# Patient Record
Sex: Male | Born: 1935
Health system: Southern US, Community
[De-identification: ages and names within clinical notes are randomized; demographics above are authoritative.]

## PROBLEM LIST (undated history)

## (undated) DIAGNOSIS — I6529 Occlusion and stenosis of unspecified carotid artery: Secondary | ICD-10-CM

## (undated) DIAGNOSIS — K644 Residual hemorrhoidal skin tags: Secondary | ICD-10-CM

## (undated) DIAGNOSIS — I10 Essential (primary) hypertension: Secondary | ICD-10-CM

## (undated) DIAGNOSIS — K2950 Unspecified chronic gastritis without bleeding: Secondary | ICD-10-CM

## (undated) DIAGNOSIS — C029 Malignant neoplasm of tongue, unspecified: Secondary | ICD-10-CM

## (undated) DIAGNOSIS — K5730 Diverticulosis of large intestine without perforation or abscess without bleeding: Secondary | ICD-10-CM

## (undated) DIAGNOSIS — E871 Hypo-osmolality and hyponatremia: Secondary | ICD-10-CM

## (undated) DIAGNOSIS — J449 Chronic obstructive pulmonary disease, unspecified: Secondary | ICD-10-CM

## (undated) DIAGNOSIS — H3530 Unspecified macular degeneration: Secondary | ICD-10-CM

## (undated) DIAGNOSIS — M1990 Unspecified osteoarthritis, unspecified site: Secondary | ICD-10-CM

## (undated) DIAGNOSIS — K648 Other hemorrhoids: Secondary | ICD-10-CM

## (undated) DIAGNOSIS — I4892 Unspecified atrial flutter: Principal | ICD-10-CM

## (undated) DIAGNOSIS — K219 Gastro-esophageal reflux disease without esophagitis: Secondary | ICD-10-CM

## (undated) DIAGNOSIS — K635 Polyp of colon: Secondary | ICD-10-CM

## (undated) DIAGNOSIS — K589 Irritable bowel syndrome without diarrhea: Secondary | ICD-10-CM

## (undated) DIAGNOSIS — J189 Pneumonia, unspecified organism: Secondary | ICD-10-CM

## (undated) DIAGNOSIS — I639 Cerebral infarction, unspecified: Secondary | ICD-10-CM

## (undated) HISTORY — DX: Residual hemorrhoidal skin tags: K64.4

## (undated) HISTORY — DX: Cerebral infarction, unspecified: I63.9

## (undated) HISTORY — PX: TRIGGER FINGER RELEASE: SHX641

## (undated) HISTORY — DX: Polyp of colon: K63.5

## (undated) HISTORY — PX: CATARACT EXTRACTION W/ INTRAOCULAR LENS IMPLANT: SHX1309

## (undated) HISTORY — DX: Gastro-esophageal reflux disease without esophagitis: K21.9

## (undated) HISTORY — DX: Other hemorrhoids: K64.8

## (undated) HISTORY — DX: Diverticulosis of large intestine without perforation or abscess without bleeding: K57.30

## (undated) HISTORY — DX: Chronic obstructive pulmonary disease, unspecified: J44.9

## (undated) HISTORY — DX: Irritable bowel syndrome, unspecified: K58.9

## (undated) HISTORY — DX: Unspecified chronic gastritis without bleeding: K29.50

## (undated) HISTORY — DX: Unspecified macular degeneration: H35.30

## (undated) HISTORY — DX: Essential (primary) hypertension: I10

---

## 1966-01-08 HISTORY — PX: HEMORRHOID SURGERY: SHX153

## 1997-02-08 DIAGNOSIS — C029 Malignant neoplasm of tongue, unspecified: Secondary | ICD-10-CM

## 1997-02-08 HISTORY — PX: OTHER SURGICAL HISTORY: SHX169

## 1997-02-08 HISTORY — DX: Malignant neoplasm of tongue, unspecified: C02.9

## 1997-05-05 ENCOUNTER — Other Ambulatory Visit: Admission: RE | Admit: 1997-05-05 | Discharge: 1997-05-05 | Payer: Self-pay | Admitting: Otolaryngology

## 1998-12-26 ENCOUNTER — Encounter: Payer: Self-pay | Admitting: Otolaryngology

## 1998-12-26 ENCOUNTER — Encounter: Admission: RE | Admit: 1998-12-26 | Discharge: 1998-12-26 | Payer: Self-pay | Admitting: Otolaryngology

## 1998-12-30 ENCOUNTER — Ambulatory Visit (HOSPITAL_COMMUNITY): Admission: RE | Admit: 1998-12-30 | Discharge: 1998-12-30 | Payer: Self-pay | Admitting: Otolaryngology

## 1998-12-30 ENCOUNTER — Encounter: Payer: Self-pay | Admitting: Otolaryngology

## 1999-09-04 ENCOUNTER — Encounter: Payer: Self-pay | Admitting: Otolaryngology

## 1999-09-04 ENCOUNTER — Encounter: Admission: RE | Admit: 1999-09-04 | Discharge: 1999-09-04 | Payer: Self-pay | Admitting: Otolaryngology

## 2000-09-06 ENCOUNTER — Encounter: Payer: Self-pay | Admitting: Otolaryngology

## 2000-09-06 ENCOUNTER — Encounter: Admission: RE | Admit: 2000-09-06 | Discharge: 2000-09-06 | Payer: Self-pay | Admitting: Otolaryngology

## 2001-07-28 ENCOUNTER — Encounter: Payer: Self-pay | Admitting: Otolaryngology

## 2001-07-28 ENCOUNTER — Encounter: Admission: RE | Admit: 2001-07-28 | Discharge: 2001-07-28 | Payer: Self-pay | Admitting: Otolaryngology

## 2002-08-04 ENCOUNTER — Encounter: Admission: RE | Admit: 2002-08-04 | Discharge: 2002-08-04 | Payer: Self-pay | Admitting: Otolaryngology

## 2002-08-04 ENCOUNTER — Encounter: Payer: Self-pay | Admitting: Otolaryngology

## 2002-12-09 ENCOUNTER — Emergency Department (HOSPITAL_COMMUNITY): Admission: EM | Admit: 2002-12-09 | Discharge: 2002-12-09 | Payer: Self-pay | Admitting: Emergency Medicine

## 2005-05-21 ENCOUNTER — Ambulatory Visit: Payer: Self-pay | Admitting: Gastroenterology

## 2005-06-19 ENCOUNTER — Ambulatory Visit: Payer: Self-pay | Admitting: Gastroenterology

## 2005-06-26 ENCOUNTER — Ambulatory Visit: Payer: Self-pay | Admitting: Gastroenterology

## 2005-06-26 ENCOUNTER — Encounter (INDEPENDENT_AMBULATORY_CARE_PROVIDER_SITE_OTHER): Payer: Self-pay | Admitting: Specialist

## 2005-06-26 DIAGNOSIS — K635 Polyp of colon: Secondary | ICD-10-CM

## 2005-06-26 HISTORY — DX: Polyp of colon: K63.5

## 2007-10-15 ENCOUNTER — Encounter: Payer: Self-pay | Admitting: Cardiology

## 2008-11-12 ENCOUNTER — Encounter: Payer: Self-pay | Admitting: Cardiology

## 2008-11-15 ENCOUNTER — Ambulatory Visit: Payer: Self-pay | Admitting: Cardiology

## 2008-11-15 DIAGNOSIS — J4489 Other specified chronic obstructive pulmonary disease: Secondary | ICD-10-CM | POA: Insufficient documentation

## 2008-11-15 DIAGNOSIS — K589 Irritable bowel syndrome without diarrhea: Secondary | ICD-10-CM | POA: Insufficient documentation

## 2008-11-15 DIAGNOSIS — R0789 Other chest pain: Secondary | ICD-10-CM | POA: Insufficient documentation

## 2008-11-15 DIAGNOSIS — R9431 Abnormal electrocardiogram [ECG] [EKG]: Secondary | ICD-10-CM

## 2008-11-15 DIAGNOSIS — K219 Gastro-esophageal reflux disease without esophagitis: Secondary | ICD-10-CM | POA: Insufficient documentation

## 2008-11-15 DIAGNOSIS — I10 Essential (primary) hypertension: Secondary | ICD-10-CM | POA: Insufficient documentation

## 2008-11-15 DIAGNOSIS — J449 Chronic obstructive pulmonary disease, unspecified: Secondary | ICD-10-CM

## 2008-11-17 ENCOUNTER — Encounter: Payer: Self-pay | Admitting: Cardiology

## 2010-06-15 ENCOUNTER — Encounter: Payer: Self-pay | Admitting: Internal Medicine

## 2010-07-27 ENCOUNTER — Encounter: Payer: Self-pay | Admitting: Internal Medicine

## 2010-07-27 ENCOUNTER — Other Ambulatory Visit (INDEPENDENT_AMBULATORY_CARE_PROVIDER_SITE_OTHER): Payer: Medicare Other

## 2010-07-27 ENCOUNTER — Ambulatory Visit (INDEPENDENT_AMBULATORY_CARE_PROVIDER_SITE_OTHER): Payer: Medicare Other | Admitting: Internal Medicine

## 2010-07-27 VITALS — BP 128/68 | HR 78 | Ht 74.0 in | Wt 165.0 lb

## 2010-07-27 DIAGNOSIS — R195 Other fecal abnormalities: Secondary | ICD-10-CM

## 2010-07-27 DIAGNOSIS — D649 Anemia, unspecified: Secondary | ICD-10-CM

## 2010-07-27 LAB — CBC WITH DIFFERENTIAL/PLATELET
Basophils Absolute: 0 10*3/uL (ref 0.0–0.1)
Basophils Relative: 0.8 % (ref 0.0–3.0)
Eosinophils Absolute: 0.1 10*3/uL (ref 0.0–0.7)
Lymphocytes Relative: 16.6 % (ref 12.0–46.0)
MCHC: 33.9 g/dL (ref 30.0–36.0)
MCV: 95.9 fl (ref 78.0–100.0)
Monocytes Absolute: 0.8 10*3/uL (ref 0.1–1.0)
Neutrophils Relative %: 66.3 % (ref 43.0–77.0)
RDW: 12.7 % (ref 11.5–14.6)

## 2010-07-27 MED ORDER — PEG-KCL-NACL-NASULF-NA ASC-C 100 G PO SOLR: 1.0000 | Freq: Once | ORAL | Status: DC

## 2010-07-27 NOTE — Patient Instructions (Signed)
Please go to the basement upon leaving today to have your labs done. You have been scheduled for a Colonoscopy with separate instructions given. Your prep kit has been sent to your pharmacy for you to pick up. 

## 2010-07-27 NOTE — Assessment & Plan Note (Signed)
Hemoglobin 13 in June we'll recheck today to see what it is he could need other workup. Possibly related to heme positive stool.

## 2010-07-27 NOTE — Progress Notes (Signed)
  Subjective:    Patient ID: Chad [name], male    DOB: 03/27/1931, 77 y.o.   MRN: [id number]  HPI this is a 77 year old man known to this practice with prior IVS, colon polyps and gastritis on endoscopic evaluations, last in 2007. He developed anorexia and mild weight loss problems and was bloated. He saw Dr. Perrin Maltese and lab workup showed a very mild anemia with hemoglobin 13 which is normocytic but he had a heme positive stool on a HEENT no short tests. Since he saw Dr. Perrin Maltese he feels completely better and says he was ill for about one to 2 weeks. He has also had a urologic evaluation including what sounds like the way and even a cystoscopy. He has not had any change in bowels or rectal bleeding.  Review of Systems hematuriia, headaches, epistaxis, left ankle edemaall other ROS negatve or as per HPI   Objective:   Physical Exam  Constitutional: He appears well-developed and well-nourished.       Elderly  HENT:  Head: Normocephalic and atraumatic.  Mouth/Throat: No oropharyngeal exudate.       Dentures  Eyes:       Bilateral arcus no icterus  Neck: Neck supple. No tracheal deviation present. No thyromegaly present.  Cardiovascular: Normal rate, regular rhythm and normal heart sounds.  Exam reveals no gallop and no friction rub.   No murmur heard. Pulmonary/Chest: Effort normal and breath sounds normal.  Abdominal: Soft. Bowel sounds are normal. He exhibits no distension and no mass. There is no tenderness. There is no guarding.  Genitourinary:       Rectal exam deferred  Musculoskeletal: He exhibits edema.       Trace to 1+ left ankle edema calf is normal without tenderness or cords no edema in the right ankle  Lymphadenopathy:    He has no cervical adenopathy.  Skin: Skin is warm and dry.  Psychiatric: He has a normal mood and affect. His behavior is normal.          Assessment & Plan:

## 2010-07-28 ENCOUNTER — Telehealth: Payer: Self-pay

## 2010-07-28 DIAGNOSIS — D649 Anemia, unspecified: Secondary | ICD-10-CM

## 2010-07-28 DIAGNOSIS — R6889 Other general symptoms and signs: Secondary | ICD-10-CM

## 2010-07-28 NOTE — Telephone Encounter (Signed)
Patient advised he will come for labs one day next week.

## 2010-07-28 NOTE — Telephone Encounter (Signed)
Message copied by Annett Fabian on Fri Jul 28, 2010  1:38 PM ------      Message from: Stan Head E      Created: Thu Jul 27, 2010 11:03 PM      Regarding: anemia       Needs B12, ferritin level to evaluate anemia which is mild and persistent, please

## 2010-07-31 ENCOUNTER — Other Ambulatory Visit (INDEPENDENT_AMBULATORY_CARE_PROVIDER_SITE_OTHER): Payer: Medicare Other

## 2010-07-31 DIAGNOSIS — D649 Anemia, unspecified: Secondary | ICD-10-CM

## 2010-07-31 DIAGNOSIS — R6889 Other general symptoms and signs: Secondary | ICD-10-CM

## 2010-07-31 LAB — FERRITIN: Ferritin: 69.8 ng/mL (ref 22.0–322.0)

## 2010-08-02 NOTE — Progress Notes (Signed)
Quick Note:  B12 and iron are ok but ferritin < 100 so ferrous sulfate 325 mg daily is reasonable - it may help Will discuss more at colonoscopy ______

## 2010-08-29 ENCOUNTER — Ambulatory Visit (AMBULATORY_SURGERY_CENTER): Payer: Medicare Other | Admitting: Internal Medicine

## 2010-08-29 ENCOUNTER — Encounter: Payer: Self-pay | Admitting: Internal Medicine

## 2010-08-29 VITALS — BP 149/84 | HR 84 | Temp 97.1°F | Resp 18 | Ht 74.0 in | Wt 165.0 lb

## 2010-08-29 DIAGNOSIS — K5730 Diverticulosis of large intestine without perforation or abscess without bleeding: Secondary | ICD-10-CM

## 2010-08-29 DIAGNOSIS — R195 Other fecal abnormalities: Secondary | ICD-10-CM

## 2010-08-29 DIAGNOSIS — K649 Unspecified hemorrhoids: Secondary | ICD-10-CM

## 2010-08-29 HISTORY — PX: COLONOSCOPY: SHX174

## 2010-08-29 MED ORDER — SODIUM CHLORIDE 0.9 % IV SOLN: 500.0000 mL | INTRAVENOUS | Status: DC

## 2010-08-29 MED ORDER — FERROUS SULFATE 325 (65 FE) MG PO TABS: 325.0000 mg | ORAL_TABLET | Freq: Every day | ORAL | Status: DC

## 2010-08-29 NOTE — Patient Instructions (Addendum)
Colonoscopy showed hemorrhoids and diverticulosis. The hemorrhoids are what most likely caused the microscopic blood in your stool. It is not entirely clear why you are anemic. I do not think any further endoscopy tests are necessary at this time but suggest you follow-up with your primary care doctor and have your hemoglobin and complete blood count checked again in September or October. Your iron levels are normal but are in the low normal range so it is reasonable to take an over the counter iron supplement - called ferrous sulfate - 325 mg - one a day. I added this to your medication list and recommend you start that. You do not need future routine colonoscopy or testing of the stool for blood (routine). Iva Boop, MD, [facility]  HANDOUTS GIVEN ON DIVERTICULOSIS, HIGH FIBER DIET, AND HEMORRHOIDS  DISCHARGE INSTRUCTIONS GIVEN PER BLUE AND GREEN SHEETS  TRY FERROUS SULFATE ONCE A DAY FOR ANEMIA   FOLLOW UP WITH YOUR PRIMARY CARE PROVIDER IN 1-2 MONTHS TO HAVE HEMOGLOBIN (iron) LEVEL RECHECKED

## 2010-08-30 ENCOUNTER — Telehealth: Payer: Self-pay | Admitting: *Deleted

## 2010-08-30 NOTE — Telephone Encounter (Signed)
Follow up Call- Patient questions:  Do you have a fever, pain , or abdominal swelling? no Pain Score  0 *  Have you tolerated food without any problems? yes  Have you been able to return to your normal activities? yes  Do you have any questions about your discharge instructions: Diet   no Medications  no Follow up visit  no  Do you have questions or concerns about your Care? no  Actions: * If pain score is 4 or above: No action needed, pain <4.  Follow up Call- Patient questions:  Do you have a fever, pain , or abdominal swelling? no Pain Score  0 *  Have you tolerated food without any problems? yes  Have you been able to return to your normal activities? yes  Do you have any questions about your discharge instructions: Diet   no Medications  no Follow up visit  no  Do you have questions or concerns about your Care? no  Actions: * If pain score is 4 or above: No action needed, pain <4.

## 2010-09-12 ENCOUNTER — Other Ambulatory Visit: Payer: Medicare Other | Admitting: Internal Medicine

## 2010-09-15 ENCOUNTER — Telehealth: Payer: Self-pay

## 2010-09-15 NOTE — Telephone Encounter (Signed)
Patient is scheduled for pre-visit 09/18/10 and ENDO 09/21/10.

## 2010-09-15 NOTE — Telephone Encounter (Signed)
Message copied by Annett Fabian on Fri Sep 15, 2010  1:48 PM ------      Message from: Iva Boop      Created: Fri Sep 15, 2010 11:52 AM      Regarding: RE: ? weight loss problems - early satiety       Let's schedule EGD re: heme + stool and weight loss      ----- Message -----         From: Rossie Muskrat, RN,CGRN         Sent: 09/15/2010  10:23 AM           To: Iva Boop, MD      Subject: RE: ? weight loss problems - early satiety               According to Dr Leodis [place] records last few weights       08/21/10 153.4      06/13/10    150.4      12/18/09 162      06/21/09    165.2            Please advise if you want EGD            Sheri                  ----- Message -----         From: Iva Boop, MD         Sent: 09/15/2010   5:39 AM           To: Rossie Muskrat, RN,CGRN      Subject: ? weight loss problems - early satiety                   Please communicate this to Dr. Perrin Maltese - there is a ? If Mr.Berkey was having weight loss and maybe early satiety (sounded chronic).      Recent colonoscopy for heme + stool showed hemorrhoids which could cause heme + stool and I suspect so. Mild anemia present.            Supposed to see Dr. Perrin Maltese in Sept-Oct to follow-up.            I am ?ing if there has been actual weight loss - we do not have longitudinal records so if we could get weights to see      If he has had weight loss will schedule an EGD

## 2010-09-15 NOTE — Telephone Encounter (Signed)
I have left a message for the patient to call back to schedule an EGD

## 2010-09-18 ENCOUNTER — Ambulatory Visit (AMBULATORY_SURGERY_CENTER): Payer: Medicare Other | Admitting: *Deleted

## 2010-09-18 VITALS — Ht 74.0 in | Wt 162.1 lb

## 2010-09-18 DIAGNOSIS — D649 Anemia, unspecified: Secondary | ICD-10-CM

## 2010-09-18 DIAGNOSIS — R195 Other fecal abnormalities: Secondary | ICD-10-CM

## 2010-09-20 HISTORY — PX: ESOPHAGOGASTRODUODENOSCOPY: SHX1529

## 2010-09-21 ENCOUNTER — Encounter: Payer: Self-pay | Admitting: Internal Medicine

## 2010-09-21 ENCOUNTER — Ambulatory Visit (AMBULATORY_SURGERY_CENTER): Payer: Medicare Other | Admitting: Internal Medicine

## 2010-09-21 DIAGNOSIS — D649 Anemia, unspecified: Secondary | ICD-10-CM

## 2010-09-21 DIAGNOSIS — R634 Abnormal weight loss: Secondary | ICD-10-CM

## 2010-09-21 DIAGNOSIS — R195 Other fecal abnormalities: Secondary | ICD-10-CM

## 2010-09-21 MED ORDER — SODIUM CHLORIDE 0.9 % IV SOLN: 500.0000 mL | INTRAVENOUS | Status: DC

## 2010-09-21 NOTE — Patient Instructions (Addendum)
The esophagus, stomach and duodenum looked normal. I suspect you were having some stomach spasms that made you feel full and lose weight. I am glad you are feeling better and I would not do any more testing for that or the mild anemia. You can see me as needed. Keep seeing Dr. Perrin Maltese as planned. Iva Boop, MD, [facility]   FOLLOW DISCHARGE INSTRUCTIONS (BLUE & GREEN SHEETS)

## 2010-09-22 ENCOUNTER — Telehealth: Payer: Self-pay | Admitting: *Deleted

## 2010-09-22 NOTE — Telephone Encounter (Signed)
Voice mail stated that they were unavailable with no ID.  No message left.

## 2010-12-25 ENCOUNTER — Encounter (INDEPENDENT_AMBULATORY_CARE_PROVIDER_SITE_OTHER): Payer: Medicare Other | Admitting: Internal Medicine

## 2010-12-25 DIAGNOSIS — Z0000 Encounter for general adult medical examination without abnormal findings: Secondary | ICD-10-CM

## 2010-12-25 DIAGNOSIS — I10 Essential (primary) hypertension: Secondary | ICD-10-CM

## 2010-12-25 DIAGNOSIS — Z79899 Other long term (current) drug therapy: Secondary | ICD-10-CM

## 2010-12-25 DIAGNOSIS — R634 Abnormal weight loss: Secondary | ICD-10-CM

## 2011-01-22 ENCOUNTER — Ambulatory Visit (INDEPENDENT_AMBULATORY_CARE_PROVIDER_SITE_OTHER): Payer: Medicare Other

## 2011-01-22 DIAGNOSIS — J069 Acute upper respiratory infection, unspecified: Secondary | ICD-10-CM

## 2011-01-22 DIAGNOSIS — R42 Dizziness and giddiness: Secondary | ICD-10-CM | POA: Diagnosis not present

## 2011-02-04 ENCOUNTER — Ambulatory Visit (INDEPENDENT_AMBULATORY_CARE_PROVIDER_SITE_OTHER): Payer: Medicare Other

## 2011-02-04 DIAGNOSIS — E871 Hypo-osmolality and hyponatremia: Secondary | ICD-10-CM

## 2011-02-06 ENCOUNTER — Telehealth: Payer: Self-pay

## 2011-02-06 NOTE — Telephone Encounter (Signed)
Called pt-- he was confused regarding his bp medicine.  Given last visits blood pressure.  Understand that he needs to return to see Dr. Perrin Maltese to f/up in 1 month.

## 2011-02-06 NOTE — Telephone Encounter (Signed)
.  [facility] PT WANTS TO KNOW WHAT HIS BP NUMBERS WERE THE LAST OV  PELEASE CALL [phone]

## 2011-02-15 ENCOUNTER — Ambulatory Visit (INDEPENDENT_AMBULATORY_CARE_PROVIDER_SITE_OTHER): Payer: Medicare Other | Admitting: Emergency Medicine

## 2011-02-15 VITALS — BP 153/75 | HR 112 | Temp 97.8°F | Resp 16 | Ht 73.0 in | Wt 154.6 lb

## 2011-02-15 DIAGNOSIS — E871 Hypo-osmolality and hyponatremia: Secondary | ICD-10-CM | POA: Diagnosis not present

## 2011-02-15 DIAGNOSIS — I10 Essential (primary) hypertension: Secondary | ICD-10-CM

## 2011-02-15 MED ORDER — ENALAPRIL MALEATE 5 MG PO TABS: 10.0000 mg | ORAL_TABLET | Freq: Every day | ORAL | Status: DC

## 2011-02-15 MED ORDER — ENALAPRIL MALEATE 10 MG PO TABS: 10.0000 mg | ORAL_TABLET | Freq: Every day | ORAL | Status: DC

## 2011-02-15 NOTE — Progress Notes (Signed)
  Subjective:    Patient ID: Chad [name], male    DOB: 07-15-1931, 78 y.o.   MRN: [id number]  HPI to followup on his blood pressure popping his diuretic his blood pressure slowly increased to a reticulocyte was stopped because of hyponatremia which resolved when he stopped his HCTZ to    Review of Systems he denies chest pain shortness of breath or any other significant problems.     Objective:   Physical Exam his physical exam is unremarkable chest is clear his heart is regular rate without murmurs repeat blood pressure was 142/80 4P        Assessment & Plan:  Plan he is to keep him off of his diuretic because of problems with hyponatremia and put him on enalapril 10 mg one a day which is an increase in dose

## 2011-02-20 ENCOUNTER — Telehealth: Payer: Self-pay

## 2011-02-20 DIAGNOSIS — I10 Essential (primary) hypertension: Secondary | ICD-10-CM

## 2011-02-20 NOTE — Telephone Encounter (Signed)
Pt needs rxs refilled through [facility]. Had this done in November but wasn't sure what he needed to do to get them again.

## 2011-02-21 NOTE — Telephone Encounter (Signed)
LMOM to CB to verify Rxs needed to be sent to [facility]

## 2011-02-22 MED ORDER — ENALAPRIL MALEATE 10 MG PO TABS: 10.0000 mg | ORAL_TABLET | Freq: Every day | ORAL | Status: DC

## 2011-02-22 MED ORDER — POTASSIUM CHLORIDE ER 10 MEQ PO TBCR: 10.0000 meq | EXTENDED_RELEASE_TABLET | Freq: Every day | ORAL | Status: DC

## 2011-02-22 NOTE — Telephone Encounter (Signed)
See below message. Ordering enalapril and Potas. 90 plus 3 RFs per Dr Ellis Parents OV notes on 2/7, and called in 2 weeks Potas. Locally to [facility] and cancelled enalapril x 1 yr that Dr Cleta Alberts had sent on 2/7.

## 2011-02-22 NOTE — Telephone Encounter (Signed)
Pt CB with meds needed to be sent to [facility]. Pt needs enalapril 10 mg and Potassium 750 to [facility] and 2 weeks of Pot[facility] to [facility] [address]

## 2011-02-27 ENCOUNTER — Other Ambulatory Visit: Payer: Self-pay | Admitting: *Deleted

## 2011-02-27 MED ORDER — POTASSIUM CHLORIDE ER 10 MEQ PO TBCR: 10.0000 meq | EXTENDED_RELEASE_TABLET | Freq: Every day | ORAL | Status: DC

## 2011-06-11 DIAGNOSIS — H2510 Age-related nuclear cataract, unspecified eye: Secondary | ICD-10-CM | POA: Diagnosis not present

## 2011-06-11 DIAGNOSIS — H353190 Nonexudative age-related macular degeneration, unspecified eye, stage unspecified: Secondary | ICD-10-CM | POA: Diagnosis not present

## 2011-07-30 ENCOUNTER — Ambulatory Visit (INDEPENDENT_AMBULATORY_CARE_PROVIDER_SITE_OTHER): Payer: Medicare Other | Admitting: Internal Medicine

## 2011-07-30 ENCOUNTER — Encounter: Payer: Self-pay | Admitting: Internal Medicine

## 2011-07-30 VITALS — BP 163/91 | HR 105 | Temp 97.1°F | Resp 18 | Ht 73.0 in | Wt 157.0 lb

## 2011-07-30 DIAGNOSIS — Z79899 Other long term (current) drug therapy: Secondary | ICD-10-CM | POA: Diagnosis not present

## 2011-07-30 DIAGNOSIS — I10 Essential (primary) hypertension: Secondary | ICD-10-CM | POA: Diagnosis not present

## 2011-07-30 DIAGNOSIS — R000 Tachycardia, unspecified: Secondary | ICD-10-CM

## 2011-07-30 LAB — BASIC METABOLIC PANEL
BUN: 17 mg/dL (ref 6–23)
Creat: 1.14 mg/dL (ref 0.50–1.35)

## 2011-07-30 LAB — CBC WITH DIFFERENTIAL/PLATELET
Eosinophils Absolute: 0.1 10*3/uL (ref 0.0–0.7)
Hemoglobin: 14 g/dL (ref 13.0–17.0)
Lymphocytes Relative: 17 % (ref 12–46)
Lymphs Abs: 1.4 10*3/uL (ref 0.7–4.0)
MCH: 32.4 pg (ref 26.0–34.0)
Monocytes Relative: 14 % — ABNORMAL HIGH (ref 3–12)
Neutro Abs: 5.9 10*3/uL (ref 1.7–7.7)
Neutrophils Relative %: 67 % (ref 43–77)
Platelets: 234 10*3/uL (ref 150–400)
RBC: 4.32 MIL/uL (ref 4.22–5.81)
WBC: 8.6 10*3/uL (ref 4.0–10.5)

## 2011-07-30 LAB — TSH: TSH: 2.681 u[IU]/mL (ref 0.350–4.500)

## 2011-07-30 MED ORDER — METOPROLOL TARTRATE 25 MG PO TABS: 25.0000 mg | ORAL_TABLET | Freq: Two times a day (BID) | ORAL | Status: DC

## 2011-07-30 NOTE — Progress Notes (Signed)
  Subjective:    Patient ID: Chad [name], male    DOB: 06-05-1931, 78 y.o.   MRN: [id number]  HPI Feels good No problems with meds Exercising more. HTN, anemia  Review of Systems     Objective:   Physical Exam 150/90  163/91 pulse 105 Lungs clear  Heart rr, tachy no murmur    Assessment & Plan:  HTN with tachycardia Add metoprolol 25mg  bid RTC 1 mo

## 2011-08-27 ENCOUNTER — Ambulatory Visit (INDEPENDENT_AMBULATORY_CARE_PROVIDER_SITE_OTHER): Payer: Medicare Other | Admitting: Internal Medicine

## 2011-08-27 VITALS — BP 173/73 | HR 69 | Temp 98.0°F | Resp 18 | Ht 72.5 in | Wt 155.2 lb

## 2011-08-27 DIAGNOSIS — I10 Essential (primary) hypertension: Secondary | ICD-10-CM | POA: Diagnosis not present

## 2011-08-27 DIAGNOSIS — Z79899 Other long term (current) drug therapy: Secondary | ICD-10-CM | POA: Diagnosis not present

## 2011-08-27 DIAGNOSIS — T671XXA Heat syncope, initial encounter: Secondary | ICD-10-CM

## 2011-08-27 NOTE — Progress Notes (Signed)
  Subjective:    Patient ID: Chad [name], male    DOB: 1931/08/19, 78 y.o.   MRN: [id number]  HPI Home bp is 140s/80s Feels good is exercising Started new med metoprolol 25mg  bid   Review of Systems     Objective:   Physical Exam BP manual 150/82  Pulse 70 Heart normal Lungs clear      Assessment & Plan:  RF meds 1 yr

## 2011-08-28 ENCOUNTER — Other Ambulatory Visit: Payer: Self-pay | Admitting: Family Medicine

## 2011-08-28 DIAGNOSIS — I10 Essential (primary) hypertension: Secondary | ICD-10-CM

## 2011-08-28 DIAGNOSIS — Z0000 Encounter for general adult medical examination without abnormal findings: Secondary | ICD-10-CM

## 2011-08-28 DIAGNOSIS — J449 Chronic obstructive pulmonary disease, unspecified: Secondary | ICD-10-CM

## 2011-08-28 DIAGNOSIS — Z5181 Encounter for therapeutic drug level monitoring: Secondary | ICD-10-CM

## 2011-08-28 DIAGNOSIS — J4489 Other specified chronic obstructive pulmonary disease: Secondary | ICD-10-CM

## 2011-08-28 DIAGNOSIS — Z125 Encounter for screening for malignant neoplasm of prostate: Secondary | ICD-10-CM

## 2011-12-07 ENCOUNTER — Other Ambulatory Visit: Payer: Self-pay | Admitting: Emergency Medicine

## 2011-12-25 DIAGNOSIS — H2510 Age-related nuclear cataract, unspecified eye: Secondary | ICD-10-CM | POA: Diagnosis not present

## 2011-12-25 DIAGNOSIS — H353190 Nonexudative age-related macular degeneration, unspecified eye, stage unspecified: Secondary | ICD-10-CM | POA: Diagnosis not present

## 2012-01-17 ENCOUNTER — Other Ambulatory Visit: Payer: Self-pay | Admitting: Emergency Medicine

## 2012-02-04 ENCOUNTER — Ambulatory Visit (INDEPENDENT_AMBULATORY_CARE_PROVIDER_SITE_OTHER): Payer: Medicare Other | Admitting: Internal Medicine

## 2012-02-04 ENCOUNTER — Telehealth: Payer: Self-pay

## 2012-02-04 ENCOUNTER — Encounter: Payer: Self-pay | Admitting: Internal Medicine

## 2012-02-04 VITALS — BP 159/79 | HR 82 | Temp 97.7°F | Resp 18 | Ht 73.5 in | Wt 157.0 lb

## 2012-02-04 DIAGNOSIS — K219 Gastro-esophageal reflux disease without esophagitis: Secondary | ICD-10-CM

## 2012-02-04 DIAGNOSIS — Z0000 Encounter for general adult medical examination without abnormal findings: Secondary | ICD-10-CM | POA: Diagnosis not present

## 2012-02-04 DIAGNOSIS — F39 Unspecified mood [affective] disorder: Secondary | ICD-10-CM

## 2012-02-04 DIAGNOSIS — C140 Malignant neoplasm of pharynx, unspecified: Secondary | ICD-10-CM | POA: Diagnosis not present

## 2012-02-04 DIAGNOSIS — Z79899 Other long term (current) drug therapy: Secondary | ICD-10-CM | POA: Diagnosis not present

## 2012-02-04 DIAGNOSIS — Z125 Encounter for screening for malignant neoplasm of prostate: Secondary | ICD-10-CM

## 2012-02-04 DIAGNOSIS — I10 Essential (primary) hypertension: Secondary | ICD-10-CM | POA: Diagnosis not present

## 2012-02-04 LAB — COMPREHENSIVE METABOLIC PANEL
ALT: 25 U/L (ref 0–53)
AST: 48 U/L — ABNORMAL HIGH (ref 0–37)
Albumin: 3.6 g/dL (ref 3.5–5.2)
Calcium: 9 mg/dL (ref 8.4–10.5)
Chloride: 103 mEq/L (ref 96–112)
Potassium: 4.1 mEq/L (ref 3.5–5.3)
Total Protein: 6 g/dL (ref 6.0–8.3)

## 2012-02-04 LAB — POCT URINALYSIS DIPSTICK
Nitrite, UA: NEGATIVE
Spec Grav, UA: 1.025
Urobilinogen, UA: 0.2

## 2012-02-04 LAB — CBC WITH DIFFERENTIAL/PLATELET
Basophils Absolute: 0.1 10*3/uL (ref 0.0–0.1)
HCT: 36.9 % — ABNORMAL LOW (ref 39.0–52.0)
Lymphocytes Relative: 15 % (ref 12–46)
Neutro Abs: 5.4 10*3/uL (ref 1.7–7.7)
Platelets: 210 10*3/uL (ref 150–400)
RBC: 3.9 MIL/uL — ABNORMAL LOW (ref 4.22–5.81)
RDW: 13.7 % (ref 11.5–15.5)
WBC: 7.2 10*3/uL (ref 4.0–10.5)

## 2012-02-04 LAB — PSA: PSA: 1.25 ng/mL (ref <=4.00)

## 2012-02-04 LAB — POCT UA - MICROSCOPIC ONLY: Yeast, UA: NEGATIVE

## 2012-02-04 LAB — LIPID PANEL: LDL Cholesterol: 86 mg/dL (ref 0–99)

## 2012-02-04 MED ORDER — PAROXETINE HCL 10 MG PO TABS: 10.0000 mg | ORAL_TABLET | ORAL | Status: DC

## 2012-02-04 NOTE — Telephone Encounter (Signed)
PT CALLED TO CHECK ON STATUS OF RX SENT OVER THIS MORNING WHEN HE HAD APPT WITH DR GUEST   PHARMACY DOES NOT HAVE RX'S     PT PH [phone]

## 2012-02-04 NOTE — Progress Notes (Signed)
  Subjective:    Patient ID: Chad [name], male    DOB: 05-Mar-1931, 79 y.o.   MRN: [id number]  HPI Feels good  See ros Needs cpe HTN and gerd controlled  Review of Systems  Constitutional: Positive for appetite change.  HENT: Positive for rhinorrhea.   Eyes: Positive for visual disturbance.  Respiratory: Negative.   Cardiovascular: Positive for leg swelling.  Gastrointestinal: Negative.   Musculoskeletal: Negative.   Skin: Negative.   Neurological: Positive for light-headedness.  Hematological: Negative.   Psychiatric/Behavioral: Positive for dysphoric mood.       Objective:   Physical Exam  Constitutional: He is oriented to person, place, and time. He appears well-developed and well-nourished. No distress.  HENT:  Right Ear: External ear normal.  Left Ear: External ear normal.  Nose: Nose normal.  Mouth/Throat: Oropharynx is clear and moist.  Eyes: Conjunctivae normal and EOM are normal. Pupils are equal, round, and reactive to light. No scleral icterus.  Neck: Normal range of motion. Neck supple. No tracheal deviation present. No thyromegaly present.  Cardiovascular: Normal rate, regular rhythm and normal heart sounds.   Pulmonary/Chest: Effort normal and breath sounds normal.  Abdominal: Soft. Bowel sounds are normal. There is no tenderness.  Genitourinary: Prostate normal and penis normal.  Musculoskeletal: Normal range of motion. He exhibits edema.  Lymphadenopathy:    He has no cervical adenopathy.  Neurological: He is alert and oriented to person, place, and time. No cranial nerve deficit. He exhibits normal muscle tone. Coordination normal.  Skin: Skin is warm and dry.  Psychiatric: His behavior is normal. Judgment and thought content normal. He exhibits a depressed mood.   Results for orders placed in visit on 02/04/12  POCT URINALYSIS DIPSTICK      Component Value Range   Color, UA yellow     Clarity, UA clear     Glucose, UA neg     Bilirubin, UA neg       Ketones, UA trace     Spec Grav, UA 1.025     Blood, UA trace     pH, UA 6.0     Protein, UA trace     Urobilinogen, UA 0.2     Nitrite, UA neg     Leukocytes, UA Negative    POCT UA - MICROSCOPIC ONLY      Component Value Range   WBC, Ur, HPF, POC 2-3     RBC, urine, microscopic 1-3     Bacteria, U Microscopic 1+     Mucus, UA pos     Epithelial cells, urine per micros 0-1     Crystals, Ur, HPF, POC neg     Casts, Ur, LPF, POC neg     Yeast, UA neg           Assessment & Plan:  Mood disorder Trial paxil 10 mg qd RF meds 1 year

## 2012-02-04 NOTE — Telephone Encounter (Signed)
Dr Perrin Maltese sent to [facility]. Patient wants 1 mo supply to last until he can get this, it is sent in for him

## 2012-02-04 NOTE — Patient Instructions (Signed)
Edema Edema is an abnormal build-up of fluids in tissues. Because this is partly dependent on gravity (water flows to the lowest place), it is more common in the legs and thighs (lower extremities). It is also common in the looser tissues, like around the eyes. Painless swelling of the feet and ankles is common and increases as a person ages. It may affect both legs and may include the calves or even thighs. When squeezed, the fluid may move out of the affected area and may leave a dent for a few moments. CAUSES   Prolonged standing or sitting in one place for extended periods of time. Movement helps pump tissue fluid into the veins, and absence of movement prevents this, resulting in edema.  Varicose veins. The valves in the veins do not work as well as they should. This causes fluid to leak into the tissues.  Fluid and salt overload.  Injury, burn, or surgery to the leg, ankle, or foot, may damage veins and allow fluid to leak out.  Sunburn damages vessels. Leaky vessels allow fluid to go out into the sunburned tissues.  Allergies (from insect bites or stings, medications or chemicals) cause swelling by allowing vessels to become leaky.  Protein in the blood helps keep fluid in your vessels. Low protein, as in malnutrition, allows fluid to leak out.  Hormonal changes, including pregnancy and menstruation, cause fluid retention. This fluid may leak out of vessels and cause edema.  Medications that cause fluid retention. Examples are sex hormones, blood pressure medications, steroid treatment, or anti-depressants.  Some illnesses cause edema, especially heart failure, kidney disease, or liver disease.  Surgery that cuts veins or lymph nodes, such as surgery done for the heart or for breast cancer, may result in edema. DIAGNOSIS  Your caregiver is usually easily able to determine what is causing your swelling (edema) by simply asking what is wrong (getting a history) and examining you (doing  a physical). Sometimes x-rays, EKG (electrocardiogram or heart tracing), and blood work may be done to evaluate for underlying medical illness. TREATMENT  General treatment includes:  Leg elevation (or elevation of the affected body part).  Restriction of fluid intake.  Prevention of fluid overload.  Compression of the affected body part. Compression with elastic bandages or support stockings squeezes the tissues, preventing fluid from entering and forcing it back into the blood vessels.  Diuretics (also called water pills or fluid pills) pull fluid out of your body in the form of increased urination. These are effective in reducing the swelling, but can have side effects and must be used only under your caregiver's supervision. Diuretics are appropriate only for some types of edema. The specific treatment can be directed at any underlying causes discovered. Heart, liver, or kidney disease should be treated appropriately. HOME CARE INSTRUCTIONS   Elevate the legs (or affected body part) above the level of the heart, while lying down.  Avoid sitting or standing still for prolonged periods of time.  Avoid putting anything directly under the knees when lying down, and do not wear constricting clothing or garters on the upper legs.  Exercising the legs causes the fluid to work back into the veins and lymphatic channels. This may help the swelling go down.  The pressure applied by elastic bandages or support stockings can help reduce ankle swelling.  A low-salt diet may help reduce fluid retention and decrease the ankle swelling.  Take any medications exactly as prescribed. SEEK MEDICAL CARE IF:  Your edema is   not responding to recommended treatments. SEEK IMMEDIATE MEDICAL CARE IF:   You develop shortness of breath or chest pain.  You cannot breathe when you lay down; or if, while lying down, you have to get up and go to the window to get your breath.  You are having increasing  swelling without relief from treatment.  You develop a fever over 102 F (38.9 C).  You develop pain or redness in the areas that are swollen.  Tell your caregiver right away if you have gained 3 lb/1.4 kg in 1 day or 5 lb/2.3 kg in a week. MAKE SURE YOU:   Understand these instructions.  Will watch your condition.  Will get help right away if you are not doing well or get worse. Document Released: 12/25/2004 Document Revised: 06/26/2011 Document Reviewed: 08/13/2007 [employer] Patient Information 2013 [employer].  

## 2012-03-30 ENCOUNTER — Other Ambulatory Visit: Payer: Self-pay | Admitting: Internal Medicine

## 2012-04-08 HISTORY — PX: CARDIAC CATHETERIZATION: SHX172

## 2012-04-13 ENCOUNTER — Ambulatory Visit (INDEPENDENT_AMBULATORY_CARE_PROVIDER_SITE_OTHER): Payer: Medicare Other | Admitting: Emergency Medicine

## 2012-04-13 VITALS — BP 137/81 | HR 76 | Temp 98.5°F | Resp 16 | Ht 74.0 in | Wt 160.0 lb

## 2012-04-13 DIAGNOSIS — I10 Essential (primary) hypertension: Secondary | ICD-10-CM | POA: Diagnosis not present

## 2012-04-13 DIAGNOSIS — R609 Edema, unspecified: Secondary | ICD-10-CM | POA: Diagnosis not present

## 2012-04-13 DIAGNOSIS — B351 Tinea unguium: Secondary | ICD-10-CM

## 2012-04-13 MED ORDER — TRIAMTERENE-HCTZ 50-25 MG PO CAPS: 1.0000 | ORAL_CAPSULE | ORAL | Status: DC

## 2012-04-13 MED ORDER — TERBINAFINE HCL 250 MG PO TABS: 250.0000 mg | ORAL_TABLET | Freq: Every day | ORAL | Status: DC

## 2012-04-13 NOTE — Progress Notes (Signed)
[facility] [address] [place] [address] [phone]  Date:  04/13/2012   Name:  Chad [name]   DOB:  November 19, 1931   MRN:  [id number]  PCP:  Tally Due, MD    Chief Complaint: Foot Swelling and Nail Problem   History of Present Illness:  Chad [name] is a 79 y.o. very pleasant male patient who presents with the following:  History of hypertension. Taken off his diuretic due to concerns about hypokalemia.  Now has swelling of ankles.  Denies shortness of breath, chest pain, wheezing or cough.  No nausea or vomiting.  No stool change or rash.  Also noticed "this week" that his left great toe nail is thickened and discolored.  Claims no pain.  Patient Active Problem List  Diagnosis  . HYPERTENSION  . COPD  . GERD  . IBS  . CHEST PAIN  . Anemia, unspecified  . Localized cancer of throat    Past Medical History  Diagnosis Date  . Hypertension   . GERD (gastroesophageal reflux disease)   . Colon polyps   . Diverticulosis of colon (without mention of hemorrhage)   . Internal and external hemorrhoids without complication   . Gastritis, chronic   . COPD (chronic obstructive pulmonary disease)   . IBS (irritable bowel syndrome)   . 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 Oropharyngeal resection      For tongue cancer  . Hand surgery    . Hemorrhoid surgery    . Colonoscopy  08/29/10    diverticulosis, internal hemorrhoids  . Esophagogastroduodenoscopy  09/20/10    normal    History  Substance Use Topics  . Smoking status: Former Smoker    Quit date: 01/09/1996  . Smokeless tobacco: Never Used  . Alcohol Use: Yes     Comment: 2 beers a day    Family History  Problem Relation Age of Onset  . Prostate cancer Brother   . Diabetes      No Known Allergies  Medication list has been reviewed and updated.  Current Outpatient Prescriptions on File Prior to Visit  Medication Sig Dispense  Refill  . aspirin 81 MG tablet Take 81 mg by mouth daily. Take 1 tab daily.       . Calcium Carbonate-Vit D-Min (CALCIUM 1200 PO) Take by mouth.      . Cholecalciferol (RA VITAMIN D-3) 2000 UNITS CAPS Take by mouth once.        . clidinium-chlordiazePOXIDE (LIBRAX) 2.5-5 MG per capsule Take 1 capsule by mouth 3 (three) times daily with meals.        . enalapril (VASOTEC) 10 MG tablet Take 1 tablet (10 mg total) by mouth daily. Take 1 tab as needed.  90 tablet  3  . metoprolol tartrate (LOPRESSOR) 25 MG tablet Take 1 tablet (25 mg total) by mouth 2 (two) times daily.  180 tablet  3  . Multiple Vitamins-Minerals (CENTRUM SILVER PO) Take by mouth 1 day or 1 dose.        . Multiple Vitamins-Minerals (OCUVITE PO) Take by mouth once.        [name] PARoxetine (PAXIL) 10 MG tablet TAKE 1 TABLET (10 MG TOTAL) BY MOUTH EVERY MORNING.  30 tablet  0  . ferrous sulfate 325 (65 FE) MG tablet Take 1 tablet (325 mg total) by mouth daily with breakfast.  30 tablet  3  . hydrochlorothiazide (,MICROZIDE/HYDRODIURIL,)  12.5 MG capsule Take 12.5 mg by mouth daily.        [name] KLOR-CON M10 10 MEQ tablet TAKE 1 TABLET (10 MEQ TOTAL) BY MOUTH DAILY  90 tablet  0  . omeprazole (PRILOSEC) 20 MG capsule Take 20 mg by mouth 2 (two) times daily.        . potassium chloride (K-DUR) 10 MEQ tablet Take 1 tablet (10 mEq total) by mouth daily.  90 tablet  0   No current facility-administered medications on file prior to visit.    Review of Systems:  As per HPI, otherwise negative.    Physical Examination: Filed Vitals:   04/13/12 1445  BP: 137/81  Pulse: 76  Temp: 98.5 F (36.9 C)  Resp: 16   Filed Vitals:   04/13/12 1445  Height: 6\' 2"  (1.88 m)  Weight: 160 lb (72.576 kg)   Body mass index is 20.53 kg/(m^2). Ideal Body Weight: Weight in (lb) to have BMI = 25: 194.3  GEN: WDWN, NAD, Non-toxic, A & O x 3 HEENT: Atraumatic, Normocephalic. Neck supple. No masses, No LAD. Ears and Nose: No external deformity. CV: RRR,  No M/G/R. No JVD. No thrill. No extra heart sounds. PULM: CTA B, no wheezes, crackles, rhonchi. No retractions. No resp. distress. No accessory muscle use. ABD: S, NT, ND, +BS. No rebound. No HSM. EXTR: No c/c.  2+ pitting both ankles.  No calf tenderness.  Onychomycosis left great toe nail. NEURO Normal gait.  PSYCH: Normally interactive. Conversant. Not depressed or anxious appearing.  Calm demeanor.    Assessment and Plan: Peripheral edema Onychomycosis lamisil Dyazide Follow up in one month   Signed,  Phillips Odor, MD

## 2012-04-13 NOTE — Patient Instructions (Addendum)
Edema Edema is an abnormal build-up of fluids in tissues. Because this is partly dependent on gravity (water flows to the lowest place), it is more common in the legs and thighs (lower extremities). It is also common in the looser tissues, like around the eyes. Painless swelling of the feet and ankles is common and increases as a person ages. It may affect both legs and may include the calves or even thighs. When squeezed, the fluid may move out of the affected area and may leave a dent for a few moments. CAUSES   Prolonged standing or sitting in one place for extended periods of time. Movement helps pump tissue fluid into the veins, and absence of movement prevents this, resulting in edema.  Varicose veins. The valves in the veins do not work as well as they should. This causes fluid to leak into the tissues.  Fluid and salt overload.  Injury, burn, or surgery to the leg, ankle, or foot, may damage veins and allow fluid to leak out.  Sunburn damages vessels. Leaky vessels allow fluid to go out into the sunburned tissues.  Allergies (from insect bites or stings, medications or chemicals) cause swelling by allowing vessels to become leaky.  Protein in the blood helps keep fluid in your vessels. Low protein, as in malnutrition, allows fluid to leak out.  Hormonal changes, including pregnancy and menstruation, cause fluid retention. This fluid may leak out of vessels and cause edema.  Medications that cause fluid retention. Examples are sex hormones, blood pressure medications, steroid treatment, or anti-depressants.  Some illnesses cause edema, especially heart failure, kidney disease, or liver disease.  Surgery that cuts veins or lymph nodes, such as surgery done for the heart or for breast cancer, may result in edema. DIAGNOSIS  Your caregiver is usually easily able to determine what is causing your swelling (edema) by simply asking what is wrong (getting a history) and examining you (doing  a physical). Sometimes x-rays, EKG (electrocardiogram or heart tracing), and blood work may be done to evaluate for underlying medical illness. TREATMENT  General treatment includes:  Leg elevation (or elevation of the affected body part).  Restriction of fluid intake.  Prevention of fluid overload.  Compression of the affected body part. Compression with elastic bandages or support stockings squeezes the tissues, preventing fluid from entering and forcing it back into the blood vessels.  Diuretics (also called water pills or fluid pills) pull fluid out of your body in the form of increased urination. These are effective in reducing the swelling, but can have side effects and must be used only under your caregiver's supervision. Diuretics are appropriate only for some types of edema. The specific treatment can be directed at any underlying causes discovered. Heart, liver, or kidney disease should be treated appropriately. HOME CARE INSTRUCTIONS   Elevate the legs (or affected body part) above the level of the heart, while lying down.  Avoid sitting or standing still for prolonged periods of time.  Avoid putting anything directly under the knees when lying down, and do not wear constricting clothing or garters on the upper legs.  Exercising the legs causes the fluid to work back into the veins and lymphatic channels. This may help the swelling go down.  The pressure applied by elastic bandages or support stockings can help reduce ankle swelling.  A low-salt diet may help reduce fluid retention and decrease the ankle swelling.  Take any medications exactly as prescribed. SEEK MEDICAL CARE IF:  Your edema is   not responding to recommended treatments. SEEK IMMEDIATE MEDICAL CARE IF:   You develop shortness of breath or chest pain.  You cannot breathe when you lay down; or if, while lying down, you have to get up and go to the window to get your breath.  You are having increasing  swelling without relief from treatment.  You develop a fever over 102 F (38.9 C).  You develop pain or redness in the areas that are swollen.  Tell your caregiver right away if you have gained 3 lb/1.4 kg in 1 day or 5 lb/2.3 kg in a week. MAKE SURE YOU:   Understand these instructions.  Will watch your condition.  Will get help right away if you are not doing well or get worse. Document Released: 12/25/2004 Document Revised: 06/26/2011 Document Reviewed: 08/13/2007 Exit[employer] Patient Information 2013 [employer].  

## 2012-05-05 ENCOUNTER — Inpatient Hospital Stay (HOSPITAL_COMMUNITY)
Admission: EM | Admit: 2012-05-05 | Discharge: 2012-05-07 | DRG: 287 | Disposition: A | Discharge status: Home / Self Care | Payer: Medicare Other | Attending: Cardiology | Admitting: Cardiology

## 2012-05-05 ENCOUNTER — Ambulatory Visit (INDEPENDENT_AMBULATORY_CARE_PROVIDER_SITE_OTHER): Payer: Medicare Other | Admitting: Internal Medicine

## 2012-05-05 ENCOUNTER — Encounter: Payer: Self-pay | Admitting: Internal Medicine

## 2012-05-05 ENCOUNTER — Encounter (HOSPITAL_COMMUNITY): Payer: Self-pay | Admitting: Physician Assistant

## 2012-05-05 ENCOUNTER — Emergency Department (HOSPITAL_COMMUNITY): Payer: Medicare Other

## 2012-05-05 VITALS — BP 130/62 | HR 88 | Temp 97.6°F | Resp 16 | Ht 72.5 in | Wt 151.6 lb

## 2012-05-05 DIAGNOSIS — Z87891 Personal history of nicotine dependence: Secondary | ICD-10-CM

## 2012-05-05 DIAGNOSIS — I4719 Other supraventricular tachycardia: Secondary | ICD-10-CM | POA: Diagnosis present

## 2012-05-05 DIAGNOSIS — K589 Irritable bowel syndrome without diarrhea: Secondary | ICD-10-CM | POA: Diagnosis present

## 2012-05-05 DIAGNOSIS — I498 Other specified cardiac arrhythmias: Secondary | ICD-10-CM | POA: Diagnosis not present

## 2012-05-05 DIAGNOSIS — I358 Other nonrheumatic aortic valve disorders: Secondary | ICD-10-CM | POA: Diagnosis present

## 2012-05-05 DIAGNOSIS — J449 Chronic obstructive pulmonary disease, unspecified: Secondary | ICD-10-CM | POA: Diagnosis present

## 2012-05-05 DIAGNOSIS — I4892 Unspecified atrial flutter: Secondary | ICD-10-CM

## 2012-05-05 DIAGNOSIS — K648 Other hemorrhoids: Secondary | ICD-10-CM | POA: Diagnosis present

## 2012-05-05 DIAGNOSIS — Z85810 Personal history of malignant neoplasm of tongue: Secondary | ICD-10-CM

## 2012-05-05 DIAGNOSIS — Z79899 Other long term (current) drug therapy: Secondary | ICD-10-CM

## 2012-05-05 DIAGNOSIS — R42 Dizziness and giddiness: Secondary | ICD-10-CM

## 2012-05-05 DIAGNOSIS — R9431 Abnormal electrocardiogram [ECG] [EKG]: Secondary | ICD-10-CM | POA: Diagnosis not present

## 2012-05-05 DIAGNOSIS — K5730 Diverticulosis of large intestine without perforation or abscess without bleeding: Secondary | ICD-10-CM | POA: Diagnosis present

## 2012-05-05 DIAGNOSIS — R404 Transient alteration of awareness: Secondary | ICD-10-CM | POA: Diagnosis not present

## 2012-05-05 DIAGNOSIS — I10 Essential (primary) hypertension: Secondary | ICD-10-CM | POA: Diagnosis not present

## 2012-05-05 DIAGNOSIS — N179 Acute kidney failure, unspecified: Secondary | ICD-10-CM | POA: Diagnosis not present

## 2012-05-05 DIAGNOSIS — K219 Gastro-esophageal reflux disease without esophagitis: Secondary | ICD-10-CM | POA: Diagnosis present

## 2012-05-05 DIAGNOSIS — J4489 Other specified chronic obstructive pulmonary disease: Secondary | ICD-10-CM | POA: Diagnosis present

## 2012-05-05 DIAGNOSIS — I219 Acute myocardial infarction, unspecified: Secondary | ICD-10-CM | POA: Diagnosis not present

## 2012-05-05 DIAGNOSIS — D649 Anemia, unspecified: Secondary | ICD-10-CM | POA: Diagnosis present

## 2012-05-05 DIAGNOSIS — I517 Cardiomegaly: Secondary | ICD-10-CM | POA: Diagnosis not present

## 2012-05-05 DIAGNOSIS — Z8249 Family history of ischemic heart disease and other diseases of the circulatory system: Secondary | ICD-10-CM

## 2012-05-05 DIAGNOSIS — E871 Hypo-osmolality and hyponatremia: Secondary | ICD-10-CM | POA: Diagnosis not present

## 2012-05-05 DIAGNOSIS — K644 Residual hemorrhoidal skin tags: Secondary | ICD-10-CM | POA: Diagnosis present

## 2012-05-05 DIAGNOSIS — Z8042 Family history of malignant neoplasm of prostate: Secondary | ICD-10-CM | POA: Diagnosis not present

## 2012-05-05 DIAGNOSIS — Z7901 Long term (current) use of anticoagulants: Secondary | ICD-10-CM | POA: Diagnosis not present

## 2012-05-05 DIAGNOSIS — R091 Pleurisy: Secondary | ICD-10-CM | POA: Diagnosis not present

## 2012-05-05 DIAGNOSIS — R079 Chest pain, unspecified: Secondary | ICD-10-CM | POA: Diagnosis not present

## 2012-05-05 HISTORY — DX: Hypo-osmolality and hyponatremia: E87.1

## 2012-05-05 HISTORY — DX: Occlusion and stenosis of unspecified carotid artery: I65.29

## 2012-05-05 HISTORY — DX: Unspecified atrial flutter: I48.92

## 2012-05-05 LAB — COMPREHENSIVE METABOLIC PANEL
ALT: 37 U/L (ref 0–53)
AST: 61 U/L — ABNORMAL HIGH (ref 0–37)
Albumin: 3.5 g/dL (ref 3.5–5.2)
BUN: 17 mg/dL (ref 6–23)
CO2: 26 mEq/L (ref 19–32)
Calcium: 9.2 mg/dL (ref 8.4–10.5)
Calcium: 9.1 mg/dL (ref 8.4–10.5)
Chloride: 90 mEq/L — ABNORMAL LOW (ref 96–112)
Creat: 1.48 mg/dL — ABNORMAL HIGH (ref 0.50–1.35)
GFR calc Af Amer: 50 mL/min — ABNORMAL LOW (ref >90)
Glucose, Bld: 99 mg/dL (ref 70–99)
Sodium: 125 mEq/L — ABNORMAL LOW (ref 135–145)
Total Protein: 6.8 g/dL (ref 6.0–8.3)

## 2012-05-05 LAB — CBC WITH DIFFERENTIAL/PLATELET
Basophils Absolute: 0 10*3/uL (ref 0.0–0.1)
Basophils Relative: 0 % (ref 0–1)
Eosinophils Absolute: 0 10*3/uL (ref 0.0–0.7)
Eosinophils Relative: 0 % (ref 0–5)
MCH: 31.8 pg (ref 26.0–34.0)
MCV: 88.6 fL (ref 78.0–100.0)
Platelets: 195 10*3/uL (ref 150–400)
RDW: 12.5 % (ref 11.5–15.5)

## 2012-05-05 LAB — POCT URINALYSIS DIPSTICK
Bilirubin, UA: NEGATIVE
Glucose, UA: NEGATIVE
Leukocytes, UA: NEGATIVE
Nitrite, UA: NEGATIVE

## 2012-05-05 LAB — PROTIME-INR: Prothrombin Time: 12 seconds (ref 11.6–15.2)

## 2012-05-05 LAB — TROPONIN I: Troponin I: <0.3 ng/mL (ref <0.30)

## 2012-05-05 MED ORDER — HEPARIN (PORCINE) IN NACL 100-0.45 UNIT/ML-% IJ SOLN
900.0000 [IU]/h | INTRAMUSCULAR | Status: DC
  Administered 2012-05-05: 900 [IU]/h via INTRAVENOUS
  Filled 2012-05-05 (×2): qty 250

## 2012-05-05 MED ORDER — PANTOPRAZOLE SODIUM 40 MG PO TBEC
40.0000 mg | DELAYED_RELEASE_TABLET | Freq: Every day | ORAL | Status: DC
  Administered 2012-05-05 – 2012-05-07 (×3): 40 mg via ORAL
  Filled 2012-05-05 (×3): qty 1

## 2012-05-05 MED ORDER — HEPARIN BOLUS VIA INFUSION
4000.0000 [IU] | Freq: Once | INTRAVENOUS | Status: AC
  Administered 2012-05-05: 4000 [IU] via INTRAVENOUS

## 2012-05-05 NOTE — ED Notes (Addendum)
Per [facility] report pt coming from dr office [facility]. Per dr office pt EKG showed MI. Pt given 324 aspirin at physicians office and O2. EMS states vitals stable en route.

## 2012-05-05 NOTE — ED Notes (Signed)
CODE STEMI CANCELLED 

## 2012-05-05 NOTE — ED Notes (Signed)
CODE STEMI CALLED. 

## 2012-05-05 NOTE — Progress Notes (Signed)
  Subjective:    Patient ID: Chad [name], male    DOB: 07-25-1931, 79 y.o.   MRN: [id number]  HPI On 2 new meds, dyazide and terbinafin. Feeling dizzy and unsteady, no cp or palpitations.   Review of Systems     Objective:   Physical Exam  Constitutional: He is oriented to person, place, and time. Vital signs are normal. He appears well-developed and well-nourished. He is cooperative. He has a sickly appearance. No distress.  Cardiovascular: S1 normal and intact distal pulses.  A regularly irregular rhythm present. Tachycardia present.  Exam reveals gallop.   No murmur heard. Pulmonary/Chest: Effort normal and breath sounds normal.  Abdominal: Soft.  Neurological: He is alert and oriented to person, place, and time. He exhibits normal muscle tone. Coordination normal.  Skin: He is diaphoretic.  Psychiatric: He has a normal mood and affect.  ASA given Oxygen started and EMTs called    EKG shows atrial flutter and inferior changes suggestive of MI    Assessment & Plan:  Possible MI with new AFlutter EMTs to ER r/o MI

## 2012-05-05 NOTE — Progress Notes (Signed)
ANTICOAGULATION CONSULT NOTE - Follow Up Consult  Pharmacy Consult for heparin Indication: atrial fibrillation  Labs:  Recent Labs  05/05/12 1431 05/05/12 2202  HGB 13.1  --   HCT 36.5*  --   PLT 195  --   APTT 26  --   LABPROT 12.0  --   INR 0.89  --   HEPARINUNFRC  --  0.68  CREATININE 1.48*  --   TROPONINI <0.30  --     Assessment/Plan:  79yo male therapeutic on heparin with initial dosing for Afib.  Will continue gtt at current rate and confirm stable with am labs.  Vernard Gambles, PharmD, BCPS  05/05/2012,11:05 PM

## 2012-05-05 NOTE — H&P (Signed)
History and Physical  Patient ID: Chad [name] MRN: [id number], DOB: July 21, 1931 Date of Encounter: 05/05/2012, 6:41 PM Primary Physician: Tally Due, MD Primary Cardiologist: Jens Som  Chief Complaint: dizziness  HPI: Chad [name] is a 79 y/o M with no prior cardiac history but HTN, GERD, oral cancer, recent LEE who presented to Urgent Care today with complaints of dizziness and found to be in newly recognized atrial flutter. He was remotely evaluated by Dr. Jens Som for atypical CP in 2010, at which time that note references a normal myoview in 2005. He was recently evaluated for peripheral edema and onychomycosis and started on Dyazide and Lamisil earlier this month. Since that time he has felt intermittent dizziness and SOB. He is fairly active and able to go up 3 flights of stairs without limiting SOB, but more recently had to start using a cane due to the intermittent dizziness. He describes it as a  "woozy" sensation. He denies any CP. He denies orthopnea, PND, syncope or presyncope. Has had one fall in the last year but this is because he tripped over something in the dark. He is unaware of irregular heartbeat. No bleeding problems. He went to urgent care and EKG was noted to be abnormal ?atrial flutter so he was referred to the ER. His initial EKG in the ER showed inferior ST changes III, avF but also ST-T changes laterally in I, avL, V5-V6. Code STEMI was called but later cancelled after inferior ST elevation had improved. His HR is regular ~100 but flutter waves are clearly apparent intermittently on telemetry. Vitals currently stable. Patient denies complaints. Na 125, Cl 89, Cr 1.48 (all previously normal), AST 61. CXR nonacute. Initial troponin neg x 1. He received 324mg  ASA at Urgent Care.  Past Medical History  Diagnosis Date  . Hypertension   . GERD (gastroesophageal reflux disease)   . Colon polyps   . Diverticulosis of colon (without mention of hemorrhage)   . Internal  and external hemorrhoids without complication   . Gastritis, chronic   . COPD (chronic obstructive pulmonary disease)   . IBS (irritable bowel syndrome)   . Oral cancer 02/1997     Most Recent Cardiac Studies:    Surgical History:  Past Surgical History  Procedure Laterality Date  . Oropharyngeal resection      For tongue cancer  . Hand surgery    . Hemorrhoid surgery    . Colonoscopy  08/29/10    diverticulosis, internal hemorrhoids  . Esophagogastroduodenoscopy  09/20/10    normal    Home Meds: Prior to Admission medications   Medication Sig Start Date End Date Taking? Authorizing Provider  aspirin 81 MG tablet Take 81 mg by mouth daily.    Yes Historical Provider, MD  Calcium Carbonate-Vit D-Min (CALCIUM 1200 PO) Take by mouth.   Yes Historical Provider, MD  Cholecalciferol (RA VITAMIN D-3) 2000 UNITS CAPS Take 1 capsule by mouth daily.    Yes Historical Provider, MD  enalapril (VASOTEC) 10 MG tablet Take 10 mg by mouth daily as needed (for blood pressure).   Yes Historical Provider, MD  metoprolol tartrate (LOPRESSOR) 25 MG tablet Take 1 tablet (25 mg total) by mouth 2 (two) times daily. 07/30/11 07/29/12 Yes Jonita Albee, MD  Multiple Vitamins-Minerals (OCUVITE PO) Take 1 capsule by mouth 2 (two) times daily.    Yes Historical Provider, MD  terbinafine (LAMISIL) 250 MG tablet Take 1 tablet (250 mg total) by mouth daily. 04/13/12  Yes Phillips Odor, MD  triamterene-hydrochlorothiazide (DYAZIDE) 37.5-25 MG per capsule Take 1 capsule by mouth every morning.   Yes Historical Provider, MD   Allergies: No Known Allergies  History   Social History  . Marital Status: Divorced    Spouse Name: N/A    Number of Children: N/A  . Years of Education: N/A   Occupational History  . Retired    Social History Main Topics  . Smoking status: Former Smoker    Quit date: 01/09/1996  . Smokeless tobacco: Never Used     Comment: 10 years   . Alcohol Use: Yes     Comment: 2-3 12 oz  beers a day  . Drug Use: No  . Sexually Active: Not on file   Other Topics Concern  . Not on file   Social History Narrative   Lives in [place] by himself    Family History  Problem Relation Age of Onset  . Prostate cancer Brother   . Diabetes    . Heart disease Brother     details unclear   Review of Systems: General: negative for chills, fever, night sweats or weight changes.  Cardiovascular: see above Dermatological: negative for rash Respiratory: negative for cough or wheezing Urologic: negative for hematuria Abdominal: negative for nausea, vomiting, diarrhea, bright red blood per rectum, melena, or hematemesis Neurologic: negative for visual changes, syncope All other systems reviewed and are otherwise negative except as noted above.  Labs:   Lab Results  Component Value Date   WBC 7.1 05/05/2012   HGB 13.1 05/05/2012   HCT 36.5* 05/05/2012   MCV 88.6 05/05/2012   PLT 195 05/05/2012    Recent Labs Lab 05/05/12 1431  NA 125*  K 4.6  CL 89*  CO2 24  BUN 19  CREATININE 1.48*  CALCIUM 9.1  PROT 6.8  BILITOT 0.5  ALKPHOS 80  ALT 37  AST 61*  GLUCOSE 113*    Recent Labs  05/05/12 1431  TROPONINI <0.30   Lab Results  Component Value Date   CHOL 179 02/04/2012   HDL 78 02/04/2012   LDLCALC 86 02/04/2012   TRIG 76 02/04/2012   Radiology/Studies:  Dg Chest 2 View 05/05/2012  *RADIOLOGY REPORT*  Clinical Data: Abnormal EKG.  Possible myocardial infarction. History of hypertension.  CHEST - 2 VIEW  Comparison: None.  Findings: The heart size and mediastinal contours are normal. The lungs are clear. There is no pleural effusion or pneumothorax. No acute osseous findings are identified.  There is symmetric biapical pleural thickening. Carotid artery calcifications are noted.  IMPRESSION: No acute cardiopulmonary process.   Original Report Authenticated By: Carey Bullocks, M.D.    EKG: Urgent Care: 05/05/12: possible atrial flutter 91bpm, LAFB, inferior/lateral ST  elevation [facility] ED: Intra-atrial reentrant rhythm with predominant 2:1 AV block, incomplete RBBB, inferior ST-changes (transient elevation III, avF, improved on f/u EKGs) with lateral ST depression/TW changes I, aL,V5-V6  Physical Exam: Blood pressure 120/91, pulse 101, resp. rate 14, height 6' (1.829 m), weight 152 lb (68.947 kg), SpO2 100.00%. General: Well developed, well nourished thin WM in no acute distress. Head: Normocephalic, atraumatic, sclera non-icteric, no xanthomas, nares are without discharge.  Neck: Negative for carotid bruits. JVD not elevated. Lungs: Clear bilaterally to auscultation without wheezes, rales, or rhonchi. Breathing is unlabored. Heart: RRR intermittent irregularity with S1 S2. No murmurs, rubs, or gallops appreciated. Abdomen: Soft, non-tender, non-distended with normoactive bowel sounds. No hepatomegaly. No rebound/guarding. No obvious abdominal masses. Msk:  Strength and tone appear normal for age.  Extremities: No clubbing or cyanosis. No edema.  Distal pedal pulses are 2+ and equal bilaterally. Neuro: Alert and oriented X 3. No focal deficit. No facial asymmetry. Moves all extremities spontaneously. Psych:  Responds to questions appropriately with a normal affect.   ASSESSMENT AND PLAN:  1. Intra-atrial reentrant rhythm: Onset has occurred since prior EKG performed in 03/2012 at which time sinus rhythm with first degree AV block was present. 2. Transient ST elevation inferiorly ? [place] 3. Hyponatremia 4. Acute renal insufficiency 5. Recent LEE 6. HTN 7. H/o gastritis 8. Remote history of oral cancer, treated 9. Carotid artery calcifications by CXR  Admit, cycle enzymes, continue heparin, gently hydrate. Suspect Cr and Na related to Dyazide. Will hold this & ACEI for now. Continue lopressor. BMET in AM. Stat echo per Dr. Dietrich Pates. Check TSH. Check carotid dopplers. Will keep NPO after midnight for possible cath in AM pending improvement in  electrolytes/stability of Cr.  Signed, Ronie Spies PA-C 05/05/2012, 6:41 PM  Cardiology Attending Patient interviewed and examined. Discussed with Dayna Dunn PA-C.  Above note annotated and modified based upon my findings.  Patient presented for a routine return visit today, but was found to have arrhythmia, orthostatic lightheadedness and inferior ST segment elevation. Lab reveals hyponatremia and acute renal failure, likely the result of treatment with diuretic.  Slow atrial flutter of recent onset is present with adequate control of ventricular rate. Anticoagulation has been initiated, at least partially for possible acute coronary syndrome. Oral anticoagulation should be initiated this hospitalization with plans for cardioversion in 3 weeks.   Explanation for transient inferior ST segment elevation is not apparent .  I would not expect this to be due to hyponatremia.  Coronary artery spasm is possible, but there were no symptoms to suggest the presence of variant angina.  An echocardiogram will be obtained to exclude an inferior wall motion abnormality.  TSH was normal 3 months ago.  Use of cocaine or other pharmaceutical agents seems unlikely. I am inclined to recommend coronary angiography to further assess his EKG abnormalities, but would like to restore electrolytes and renal function to normal or near-normal.  Vanderbilt Bing, MD 05/05/2012, 8:47 PM

## 2012-05-05 NOTE — ED Provider Notes (Signed)
History     CSN: [id number]  Arrival date & time 05/05/12  1408   First MD Initiated Contact with Patient 05/05/12 1423      No chief complaint on file.   (Consider location/radiation/quality/duration/timing/severity/associated sxs/prior treatment) HPI Pt sent from urgent care for abnormal EKG and possible CAD. Pt denies CP, SOB, cough, fever chills, palpitations. No lower ext swelling or pain. No prev cardiac history.  Past Medical History  Diagnosis Date  . Hypertension   . Colon polyps   . Diverticulosis of colon (without mention of hemorrhage)   . Internal and external hemorrhoids without complication   . Gastritis, chronic     Pt denies bleeding 04/2012  . COPD (chronic obstructive pulmonary disease)   . IBS (irritable bowel syndrome)   . 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 Oropharyngeal resection      For tongue cancer  . Hand surgery    . Hemorrhoid surgery    . Colonoscopy  08/29/10    diverticulosis, internal hemorrhoids  . Esophagogastroduodenoscopy  09/20/10    normal    Family History  Problem Relation Age of Onset  . Prostate cancer Brother   . Diabetes    . Heart disease Brother     details unclear    History  Substance Use Topics  . Smoking status: Former Smoker    Quit date: 01/09/1996  . Smokeless tobacco: Never Used     Comment: 10 years   . Alcohol Use: Yes     Comment: 2-3 12 oz beers a day      Review of Systems  Constitutional: Negative for fever and chills.  Respiratory: Negative for shortness of breath.   Cardiovascular: Negative for chest pain.  Gastrointestinal: Negative for nausea, vomiting, abdominal pain and blood in stool.  Musculoskeletal: Negative for back pain.  Skin: Negative for rash and wound.  Neurological: Negative for dizziness, weakness, numbness and headaches.  All other systems reviewed and are negative.    Allergies  Review of patient's allergies indicates no known  allergies.  Home Medications   No current outpatient prescriptions on file.  BP 132/57  Pulse 87  Temp(Src) 97.6 F (36.4 C) (Oral)  Resp 18  Ht 6\' 1"  (1.854 m)  Wt 146 lb 9.7 oz (66.5 kg)  BMI 19.35 kg/m2  SpO2 98%  Physical Exam  Nursing note and vitals reviewed. Constitutional: He is oriented to person, place, and time. He appears well-developed and well-nourished. No distress.  HENT:  Head: Normocephalic and atraumatic.  Mouth/Throat: Oropharynx is clear and moist.  Eyes: EOM are normal. Pupils are equal, round, and reactive to light.  Neck: Normal range of motion. Neck supple.  Cardiovascular: Normal rate and regular rhythm.   Pulmonary/Chest: Effort normal and breath sounds normal. No respiratory distress. He has no wheezes. He has no rales.  Abdominal: Soft. Bowel sounds are normal. He exhibits no distension and no mass. There is no tenderness. There is no rebound and no guarding.  Easily reduced umbilical hernia  Musculoskeletal: Normal range of motion. He exhibits no edema and no tenderness.  Neurological: He is alert and oriented to person, place, and time.  Moves all ext without deficit.   Skin: Skin is warm and dry. No rash noted. No erythema.  Psychiatric: He has a normal mood and affect. His behavior is normal.    ED Course  Procedures (including critical care time)  Labs Reviewed  CBC WITH DIFFERENTIAL -  Abnormal; Notable for the following:    RBC 4.12 (*)    HCT 36.5 (*)    All other components within normal limits  COMPREHENSIVE METABOLIC PANEL - Abnormal; Notable for the following:    Sodium 125 (*)    Chloride 89 (*)    Glucose, Bld 113 (*)    Creatinine, Ser 1.48 (*)    AST 61 (*)    GFR calc non Af Amer 43 (*)    GFR calc Af Amer 50 (*)    All other components within normal limits  CBC - Abnormal; Notable for the following:    RBC 3.57 (*)    Hemoglobin 11.4 (*)    HCT 31.9 (*)    All other components within normal limits  BASIC METABOLIC  PANEL - Abnormal; Notable for the following:    Sodium 126 (*)    Chloride 93 (*)    GFR calc non Af Amer 51 (*)    GFR calc Af Amer 59 (*)    All other components within normal limits  TROPONIN I  PROTIME-INR  APTT  HEPARIN LEVEL (UNFRACTIONATED)  TROPONIN I  TROPONIN I  TROPONIN I  TSH  MAGNESIUM  LIPID PANEL  HEPARIN LEVEL (UNFRACTIONATED)  PROTIME-INR  CBC  BASIC METABOLIC PANEL   Dg Chest 2 View  05/05/2012  *RADIOLOGY REPORT*  Clinical Data: Abnormal EKG.  Possible myocardial infarction. History of hypertension.  CHEST - 2 VIEW  Comparison: None.  Findings: The heart size and mediastinal contours are normal. The lungs are clear. There is no pleural effusion or pneumothorax. No acute osseous findings are identified.  There is symmetric biapical pleural thickening. Carotid artery calcifications are noted.  IMPRESSION: No acute cardiopulmonary process.   Original Report Authenticated By: Carey Bullocks, M.D.      1. EKG abnormalities   2. Acute renal failure   3. Intra-atrial reentry tachycardia      Date: 05/05/2012  Rate: 98  Rhythm: normal sinus rhythm  QRS Axis: normal  Intervals: normal  ST/T Wave abnormalities: ST elevations inferiorly, ST depression V4, V5, V6  Conduction Disutrbances:nonspecific intraventricular conduction delay  Narrative Interpretation:   Old EKG Reviewed: changes noted    MDM  STEMI called.  Dr Clifton James at bedside. Pt remains asymptomatic. Repeat EKG with resolution of ST elevation. Code STEMI cancelled. Advised heparin per pharmacy.   Cardiology to admit        Loren Racer, MD 05/06/12 1910

## 2012-05-05 NOTE — Progress Notes (Signed)
  Echocardiogram 2D Echocardiogram has been performed.  Chad [name] 05/05/2012, 8:35 PM

## 2012-05-05 NOTE — ED Notes (Addendum)
Chad [name].   (son)    [phone]

## 2012-05-05 NOTE — Progress Notes (Signed)
ANTICOAGULATION CONSULT NOTE - Initial Consult  Pharmacy Consult for Heparin  Indication: chest pain/ACS / A-Fib  No Known Allergies  Patient Measurements: Height: 6' (182.9 cm) Weight: 152 lb (68.947 kg) IBW/kg (Calculated) : 77.6   Vital Signs: Temp: 97.6 F (36.4 C) (04/28 1036) Temp src: Oral (04/28 1036) BP: 130/62 mmHg (04/28 1036) Pulse Rate: 88 (04/28 1036)  Labs: No results found for this basename: HGB, HCT, PLT, APTT, LABPROT, INR, HEPARINUNFRC, CREATININE, CKTOTAL, CKMB, TROPONINI,  in the last 72 hours  Estimated Creatinine Clearance: 50.8 ml/min (by C-G formula based on Cr of 1.15).   Medical History: Past Medical History  Diagnosis Date  . Hypertension   . GERD (gastroesophageal reflux disease)   . Colon polyps   . Diverticulosis of colon (without mention of hemorrhage)   . Internal and external hemorrhoids without complication   . Gastritis, chronic   . COPD (chronic obstructive pulmonary disease)   . IBS (irritable bowel syndrome)   . Oral cancer 02/1997      Assessment:  81yom with Hx of DM and HTN presented to urgent care today with dizziness, weakness but no CP.  EKG showed changes from baseline and new A-Fib.  He was sent to ER.  Decided not to be STEMI but to admit, watch and anticiagualte for ACS,A-fib.   Goal of Therapy:  Heparin level 0.3-0.7 units/ml Monitor platelets by anticoagulation protocol: Yes   Plan:  Heparin bolus 4000 uts IV x1  Heparin drip 900 uts/hr Check 6hr HL  Daily HL, CBC  Leota Sauers Pharm.D. CPP, BCPS Clinical Pharmacist [phone] 05/05/2012 2:45 PM

## 2012-05-05 NOTE — ED Notes (Signed)
Ordered Reg diet tray  

## 2012-05-05 NOTE — Progress Notes (Signed)
Code STEMI called by ED staff. Pt's EKG from 2:17pm with ST abnormality in III, AVF. Pt with no complaints. No chest pain or SOB. Vitals stable. Repeat ekg with atrial fibrillation, rate 87 bpm, resolution of inferior ST abnormality. Code STEMI cancelled. Consult team to later today.   MCALHANY,CHRISTOPHER 2:47 PM 05/05/2012

## 2012-05-06 ENCOUNTER — Encounter (HOSPITAL_COMMUNITY)
Admission: EM | Disposition: A | Discharge status: Home / Self Care | Payer: Self-pay | Source: Home / Self Care | Attending: Cardiology

## 2012-05-06 ENCOUNTER — Encounter: Payer: Self-pay | Admitting: Internal Medicine

## 2012-05-06 DIAGNOSIS — N179 Acute kidney failure, unspecified: Secondary | ICD-10-CM | POA: Diagnosis not present

## 2012-05-06 DIAGNOSIS — I358 Other nonrheumatic aortic valve disorders: Secondary | ICD-10-CM | POA: Diagnosis present

## 2012-05-06 DIAGNOSIS — R9431 Abnormal electrocardiogram [ECG] [EKG]: Secondary | ICD-10-CM | POA: Diagnosis present

## 2012-05-06 DIAGNOSIS — E871 Hypo-osmolality and hyponatremia: Secondary | ICD-10-CM | POA: Diagnosis not present

## 2012-05-06 DIAGNOSIS — I4892 Unspecified atrial flutter: Secondary | ICD-10-CM | POA: Diagnosis not present

## 2012-05-06 DIAGNOSIS — I10 Essential (primary) hypertension: Secondary | ICD-10-CM | POA: Diagnosis not present

## 2012-05-06 DIAGNOSIS — R079 Chest pain, unspecified: Secondary | ICD-10-CM | POA: Diagnosis not present

## 2012-05-06 DIAGNOSIS — R42 Dizziness and giddiness: Secondary | ICD-10-CM | POA: Diagnosis not present

## 2012-05-06 HISTORY — PX: LEFT HEART CATHETERIZATION WITH CORONARY ANGIOGRAM: SHX5451

## 2012-05-06 LAB — BASIC METABOLIC PANEL
CO2: 25 mEq/L (ref 19–32)
Chloride: 93 mEq/L — ABNORMAL LOW (ref 96–112)
GFR calc non Af Amer: 51 mL/min — ABNORMAL LOW (ref >90)
Glucose, Bld: 89 mg/dL (ref 70–99)
Potassium: 4.4 mEq/L (ref 3.5–5.1)
Sodium: 126 mEq/L — ABNORMAL LOW (ref 135–145)

## 2012-05-06 LAB — MAGNESIUM: Magnesium: 2.2 mg/dL (ref 1.5–2.5)

## 2012-05-06 LAB — CBC
HCT: 31.9 % — ABNORMAL LOW (ref 39.0–52.0)
Hemoglobin: 11.4 g/dL — ABNORMAL LOW (ref 13.0–17.0)
MCHC: 35.7 g/dL (ref 30.0–36.0)
RBC: 3.57 MIL/uL — ABNORMAL LOW (ref 4.22–5.81)
WBC: 6 10*3/uL (ref 4.0–10.5)

## 2012-05-06 LAB — LIPID PANEL
HDL: 91 mg/dL (ref >39)
LDL Cholesterol: 56 mg/dL (ref 0–99)
Total CHOL/HDL Ratio: 1.7 RATIO
Triglycerides: 43 mg/dL (ref <150)

## 2012-05-06 LAB — PROTIME-INR: Prothrombin Time: 12.8 seconds (ref 11.6–15.2)

## 2012-05-06 LAB — TSH: TSH: 3.549 u[IU]/mL (ref 0.350–4.500)

## 2012-05-06 SURGERY — LEFT HEART CATHETERIZATION WITH CORONARY ANGIOGRAM
Anesthesia: LOCAL

## 2012-05-06 MED ORDER — OCUVITE PO TABS
1.0000 | ORAL_TABLET | Freq: Every day | ORAL | Status: DC
  Administered 2012-05-06 – 2012-05-07 (×2): 1 via ORAL
  Filled 2012-05-06 (×3): qty 1

## 2012-05-06 MED ORDER — ASPIRIN 81 MG PO CHEW
81.0000 mg | CHEWABLE_TABLET | Freq: Every day | ORAL | Status: DC
  Administered 2012-05-07: 81 mg via ORAL
  Filled 2012-05-06 (×2): qty 1

## 2012-05-06 MED ORDER — LIDOCAINE HCL (PF) 1 % IJ SOLN
INTRAMUSCULAR | Status: AC
  Filled 2012-05-06: qty 30

## 2012-05-06 MED ORDER — ASPIRIN 81 MG PO CHEW
324.0000 mg | CHEWABLE_TABLET | ORAL | Status: AC
  Administered 2012-05-06: 324 mg via ORAL
  Filled 2012-05-06: qty 4

## 2012-05-06 MED ORDER — VITAMIN D 1000 UNITS PO TABS
2000.0000 [IU] | ORAL_TABLET | Freq: Every day | ORAL | Status: DC
  Administered 2012-05-06 – 2012-05-07 (×2): 2000 [IU] via ORAL
  Filled 2012-05-06 (×2): qty 2

## 2012-05-06 MED ORDER — ACETAMINOPHEN 325 MG PO TABS: 650.0000 mg | ORAL_TABLET | ORAL | Status: DC | PRN

## 2012-05-06 MED ORDER — MIDAZOLAM HCL 2 MG/2ML IJ SOLN
INTRAMUSCULAR | Status: AC
  Filled 2012-05-06: qty 2

## 2012-05-06 MED ORDER — HEPARIN (PORCINE) IN NACL 2-0.9 UNIT/ML-% IJ SOLN
INTRAMUSCULAR | Status: AC
  Filled 2012-05-06: qty 1000

## 2012-05-06 MED ORDER — CALCIUM CARBONATE-VITAMIN D 500-200 MG-UNIT PO TABS
1.0000 | ORAL_TABLET | Freq: Every day | ORAL | Status: DC
  Administered 2012-05-06 – 2012-05-07 (×2): 1 via ORAL
  Filled 2012-05-06 (×2): qty 1

## 2012-05-06 MED ORDER — METOPROLOL TARTRATE 25 MG PO TABS
25.0000 mg | ORAL_TABLET | Freq: Two times a day (BID) | ORAL | Status: DC
  Administered 2012-05-06 – 2012-05-07 (×3): 25 mg via ORAL
  Filled 2012-05-06 (×5): qty 1

## 2012-05-06 MED ORDER — HEPARIN SODIUM (PORCINE) 1000 UNIT/ML IJ SOLN
INTRAMUSCULAR | Status: AC
  Filled 2012-05-06: qty 1

## 2012-05-06 MED ORDER — ONDANSETRON HCL 4 MG/2ML IJ SOLN: 4.0000 mg | Freq: Four times a day (QID) | INTRAMUSCULAR | Status: DC | PRN

## 2012-05-06 MED ORDER — SODIUM CHLORIDE 0.9 % IV SOLN
INTRAVENOUS | Status: DC
  Administered 2012-05-06 – 2012-05-07 (×2): via INTRAVENOUS

## 2012-05-06 MED ORDER — VERAPAMIL HCL 2.5 MG/ML IV SOLN
INTRAVENOUS | Status: AC
  Filled 2012-05-06: qty 2

## 2012-05-06 MED ORDER — SODIUM CHLORIDE 0.9 % IV SOLN
1.0000 mL/kg/h | INTRAVENOUS | Status: AC
  Administered 2012-05-06: 1 mL/kg/h via INTRAVENOUS

## 2012-05-06 MED ORDER — TERBINAFINE HCL 250 MG PO TABS
250.0000 mg | ORAL_TABLET | Freq: Every day | ORAL | Status: DC
  Administered 2012-05-06 – 2012-05-07 (×2): 250 mg via ORAL
  Filled 2012-05-06 (×2): qty 1

## 2012-05-06 NOTE — Progress Notes (Signed)
ANTICOAGULATION CONSULT NOTE - Follow up  Pharmacy Consult for Heparin  Indication: chest pain/ACS / A-Fib  No Known Allergies  Patient Measurements: Height: 6\' 1"  (185.4 cm) Weight: 146 lb 9.7 oz (66.5 kg) IBW/kg (Calculated) : 79.9   Vital Signs: Temp: 97.3 F (36.3 C) (04/29 0558) Temp src: Oral (04/29 0558) BP: 97/55 mmHg (04/29 0558) Pulse Rate: 84 (04/29 0558)  Labs:  Recent Labs  05/05/12 1431 05/05/12 2202 05/06/12 0211 05/06/12 0525  HGB 13.1  --   --  11.4*  HCT 36.5*  --   --  31.9*  PLT 195  --   --  161  APTT 26  --   --   --   LABPROT 12.0  --   --   --   INR 0.89  --   --   --   HEPARINUNFRC  --  0.68  --  0.66  CREATININE 1.48*  --   --  1.29  TROPONINI <0.30  --  <0.30  --     Estimated Creatinine Clearance: 43.7 ml/min (by C-G formula based on Cr of 1.29).   Medical History: Past Medical History  Diagnosis Date  . Hypertension   . Colon polyps   . Diverticulosis of colon (without mention of hemorrhage)   . Internal and external hemorrhoids without complication   . Gastritis, chronic     Pt denies bleeding 04/2012  . COPD (chronic obstructive pulmonary disease)   . IBS (irritable bowel syndrome)   . Oral cancer 02/1997      Assessment: 79yo male on heparin 900 units/hr for new onset Afib. Heparin level is 0.66, therapeutic currently.  No bleeding reported. PLTC 161k, H/H 11.4/31.9 .    Goal of Therapy:  Heparin level 0.3-0.7 units/ml Monitor platelets by anticoagulation protocol: Yes   Plan:  Contiune Heparin drip at 900 uts/hr Daily HL, CBC  Noah Delaine, RPh Clinical Pharmacist Pager: [phone] 05/06/2012 9:02 AM

## 2012-05-06 NOTE — Progress Notes (Signed)
Patient ID: Chad [name], male   DOB: 07-21-1931, 79 y.o.   MRN: [id number]   SUBJECTIVE:  The patient is stable today. His rhythm seems to possibly be atrial flutter. EKG remains abnormal. Troponins are not elevated. 2-D echo done yesterday revealed an ejection fraction 55-60%. There was some mild hypokinesis at the base of the septum. Sodium remains in the 127 range. Creatinine has improved from the 1.4 range to the 1.2 range.   Filed Vitals:   05/05/12 2330 05/05/12 2345 05/06/12 0030 05/06/12 0558  BP: 128/84 130/81 149/75 97/55  Pulse: 100 96 105 84  Temp:   97.4 F (36.3 C) 97.3 F (36.3 C)  TempSrc:   Oral Oral  Resp:   18 18  Height:   6\' 1"  (1.854 m)   Weight:    146 lb 9.7 oz (66.5 kg)  SpO2: 100% 100%  99%    Intake/Output Summary (Last 24 hours) at 05/06/12 0812 Last data filed at 05/06/12 0600  Gross per 24 hour  Intake    185 ml  Output    700 ml  Net   -515 ml    LABS: Basic Metabolic Panel:  Recent Labs  40/98/11 1431 05/06/12 0211 05/06/12 0525  NA 125*  --  126*  K 4.6  --  4.4  CL 89*  --  93*  CO2 24  --  25  GLUCOSE 113*  --  89  BUN 19  --  18  CREATININE 1.48*  --  1.29  CALCIUM 9.1  --  8.6  MG  --  2.2  --    Liver Function Tests:  Recent Labs  05/05/12 1100 05/05/12 1431  AST 52* 61*  ALT 34 37  ALKPHOS 65 80  BILITOT 0.6 0.5  PROT 6.1 6.8  ALBUMIN 3.5 3.5   No results found for this basename: LIPASE, AMYLASE,  in the last 72 hours CBC:  Recent Labs  05/05/12 1431 05/06/12 0525  WBC 7.1 6.0  NEUTROABS 5.3  --   HGB 13.1 11.4*  HCT 36.5* 31.9*  MCV 88.6 89.4  PLT 195 161   Cardiac Enzymes:  Recent Labs  05/05/12 1431 05/06/12 0211  TROPONINI <0.30 <0.30   BNP: No components found with this basename: POCBNP,  D-Dimer: No results found for this basename: DDIMER,  in the last 72 hours Hemoglobin A1C: No results found for this basename: HGBA1C,  in the last 72 hours Fasting Lipid Panel:  Recent Labs  05/06/12 0525  CHOL 156  HDL 91  LDLCALC 56  TRIG 43  CHOLHDL 1.7   Thyroid Function Tests: No results found for this basename: TSH, T4TOTAL, FREET3, T3FREE, THYROIDAB,  in the last 72 hours  RADIOLOGY: Dg Chest 2 View  05/05/2012  *RADIOLOGY REPORT*  Clinical Data: Abnormal EKG.  Possible myocardial infarction. History of hypertension.  CHEST - 2 VIEW  Comparison: None.  Findings: The heart size and mediastinal contours are normal. The lungs are clear. There is no pleural effusion or pneumothorax. No acute osseous findings are identified.  There is symmetric biapical pleural thickening. Carotid artery calcifications are noted.  IMPRESSION: No acute cardiopulmonary process.   Original Report Authenticated By: Carey Bullocks, M.D.     PHYSICAL EXAM  Patient is oriented to person time and place. Affect is normal. There is no jugulovenous distention. Lungs reveal decreased breath sounds. There is no respiratory distress. Cardiac exam reveals S1 and S2. The abdomen is soft. There is no peripheral edema.  TELEMETRY:  I have reviewed telemetry today May 06, 2012. The rhythm appears to be probable atrial flutter. The rate is in the range of 100.   ASSESSMENT AND PLAN:    HYPERTENSION     Blood pressure is stable now. No change in therapy.    COPD      The patient's pulmonary status is stable. No change in therapy.    Acute renal failure      The patient's creatinine has improved to the 1.2 range today. This is getting close to baseline but not baseline for him. I will continue very gentle hydration. Labs were rechecked again tomorrow morning.       Atrial flutter    I think his rhythm is atrial flutter. His rate is reasonably controlled. He is anticoagulated in the hospital at this time. After his catheterization outpatient anticoagulation will be appropriate with plans for cardioversion at a later date.    Abnormal EKG     The patient's troponins are normal. 2-D echo shows an  ejection fraction of 55-60%. There is mild hypokinesis at the base of the septum. I do feel it is appropriate to proceed with cardiac catheterization. At this time this morning I will plan for this cath to be done tomorrow. If his renal function looks even better during the day and it is felt that proceeding with catheterization today is reasonable, this will be considered by the cath team.    Aortic valve sclerosis    Hyponatremia    There is mild hyponatremia. We will continue to watch his sodium as his overall volume and renal function are stabilized.  Willa Rough 05/06/2012 8:12 AM

## 2012-05-06 NOTE — Progress Notes (Signed)
Utilization Review Completed.Chad [name] T4/29/2014  

## 2012-05-06 NOTE — CV Procedure (Signed)
   Cardiac Catheterization Procedure Note  Name: Chad [name] MRN: [id number] DOB: 04-13-1931  Procedure: Left Heart Cath, Selective Coronary Angiography  Indication: 79 year old white male who presents with new onset of atrial flutter associated with chest pain and an abnormal EKG.   Procedural details: I initially attempted the right radial access. Despite accessing the artery with the needle on 2 occasions I was unable to advance the wire more than a few centimeters. There appeared to be a loop in the artery. The right groin was prepped, draped, and anesthetized with 1% lidocaine. Using modified Seldinger technique, a 5 French sheath was introduced into the right femoral artery. Standard Judkins catheters were used for coronary angiography. Catheter exchanges were performed over a guidewire. There were no immediate procedural complications. The patient was transferred to the post catheterization recovery area for further monitoring.  Procedural Findings: Hemodynamics:  AO 121/64 with a mean of 88 mmHg LV 123/7 mmHg   Coronary angiography: Coronary dominance: Left  Left mainstem: The left main coronary is short and appears normal.  Left anterior descending (LAD): The LAD is a large vessel that is very tortuous in the mid and distal vessel. It appears normal.  Left circumflex (LCx): The left circumflex is a dominant vessel. It has minor irregularities in the proximal vessel less than 10%.  Right coronary artery (RCA): This is a small nondominant vessel and appears normal.  Left ventriculography: Not performed  Final Conclusions:   1. Normal coronary anatomy.  Recommendations: Medical treatment of his atrial flutter.  Theron Arista [facility] 05/06/2012, 2:36 PM

## 2012-05-06 NOTE — H&P (View-Only) (Signed)
Patient ID: Chad [name], male   DOB: 06/04/1931, 79 y.o.   MRN: [id number]   SUBJECTIVE:  The patient is stable today. His rhythm seems to possibly be atrial flutter. EKG remains abnormal. Troponins are not elevated. 2-D echo done yesterday revealed an ejection fraction 55-60%. There was some mild hypokinesis at the base of the septum. Sodium remains in the 127 range. Creatinine has improved from the 1.4 range to the 1.2 range.   Filed Vitals:   05/05/12 2330 05/05/12 2345 05/06/12 0030 05/06/12 0558  BP: 128/84 130/81 149/75 97/55  Pulse: 100 96 105 84  Temp:   97.4 F (36.3 C) 97.3 F (36.3 C)  TempSrc:   Oral Oral  Resp:   18 18  Height:   6' 1" (1.854 m)   Weight:    146 lb 9.7 oz (66.5 kg)  SpO2: 100% 100%  99%    Intake/Output Summary (Last 24 hours) at 05/06/12 0812 Last data filed at 05/06/12 0600  Gross per 24 hour  Intake    185 ml  Output    700 ml  Net   -515 ml    LABS: Basic Metabolic Panel:  Recent Labs  05/05/12 1431 05/06/12 0211 05/06/12 0525  NA 125*  --  126*  K 4.6  --  4.4  CL 89*  --  93*  CO2 24  --  25  GLUCOSE 113*  --  89  BUN 19  --  18  CREATININE 1.48*  --  1.29  CALCIUM 9.1  --  8.6  MG  --  2.2  --    Liver Function Tests:  Recent Labs  05/05/12 1100 05/05/12 1431  AST 52* 61*  ALT 34 37  ALKPHOS 65 80  BILITOT 0.6 0.5  PROT 6.1 6.8  ALBUMIN 3.5 3.5   No results found for this basename: LIPASE, AMYLASE,  in the last 72 hours CBC:  Recent Labs  05/05/12 1431 05/06/12 0525  WBC 7.1 6.0  NEUTROABS 5.3  --   HGB 13.1 11.4*  HCT 36.5* 31.9*  MCV 88.6 89.4  PLT 195 161   Cardiac Enzymes:  Recent Labs  05/05/12 1431 05/06/12 0211  TROPONINI <0.30 <0.30   BNP: No components found with this basename: POCBNP,  D-Dimer: No results found for this basename: DDIMER,  in the last 72 hours Hemoglobin A1C: No results found for this basename: HGBA1C,  in the last 72 hours Fasting Lipid Panel:  Recent Labs  05/06/12 0525  CHOL 156  HDL 91  LDLCALC 56  TRIG 43  CHOLHDL 1.7   Thyroid Function Tests: No results found for this basename: TSH, T4TOTAL, FREET3, T3FREE, THYROIDAB,  in the last 72 hours  RADIOLOGY: Dg Chest 2 View  05/05/2012  *RADIOLOGY REPORT*  Clinical Data: Abnormal EKG.  Possible myocardial infarction. History of hypertension.  CHEST - 2 VIEW  Comparison: None.  Findings: The heart size and mediastinal contours are normal. The lungs are clear. There is no pleural effusion or pneumothorax. No acute osseous findings are identified.  There is symmetric biapical pleural thickening. Carotid artery calcifications are noted.  IMPRESSION: No acute cardiopulmonary process.   Original Report Authenticated By: William Veazey, M.D.     PHYSICAL EXAM  Patient is oriented to person time and place. Affect is normal. There is no jugulovenous distention. Lungs reveal decreased breath sounds. There is no respiratory distress. Cardiac exam reveals S1 and S2. The abdomen is soft. There is no peripheral edema.     TELEMETRY:  I have reviewed telemetry today May 06, 2012. The rhythm appears to be probable atrial flutter. The rate is in the range of 100.   ASSESSMENT AND PLAN:    HYPERTENSION     Blood pressure is stable now. No change in therapy.    COPD      The patient's pulmonary status is stable. No change in therapy.    Acute renal failure      The patient's creatinine has improved to the 1.2 range today. This is getting close to baseline but not baseline for him. I will continue very gentle hydration. Labs were rechecked again tomorrow morning.       Atrial flutter    I think his rhythm is atrial flutter. His rate is reasonably controlled. He is anticoagulated in the hospital at this time. After his catheterization outpatient anticoagulation will be appropriate with plans for cardioversion at a later date.    Abnormal EKG     The patient's troponins are normal. 2-D echo shows an  ejection fraction of 55-60%. There is mild hypokinesis at the base of the septum. I do feel it is appropriate to proceed with cardiac catheterization. At this time this morning I will plan for this cath to be done tomorrow. If his renal function looks even better during the day and it is felt that proceeding with catheterization today is reasonable, this will be considered by the cath team.    Aortic valve sclerosis    Hyponatremia    There is mild hyponatremia. We will continue to watch his sodium as his overall volume and renal function are stabilized.  Chad [name] 05/06/2012 8:12 AM  

## 2012-05-06 NOTE — Care Management Note (Unsigned)
    Page 1 of 1   05/06/2012     3:02:21 PM   CARE MANAGEMENT NOTE 05/06/2012  Patient:  Chad [name], Chad [name]   Account Number:  [id number]  Date Initiated:  05/06/2012  Documentation initiated by:  Jakhi Dishman  Subjective/Objective Assessment:   PT ADM ON 05/05/12 WITH AFLUTTER.  PTA, PT INDEPENDENT, LIVES ALONE.     Action/Plan:   PLAN CATH IN AM.  WILL FOLLOW FOR HOME NEEDS AS PT PROGRESSES.   Anticipated DC Date:  05/08/2012   Anticipated DC Plan:  HOME W HOME HEALTH SERVICES      DC Planning Services  CM consult      Choice offered to / List presented to:             Status of service:  In process, will continue to follow Medicare Important Message given?   (If response is "NO", the following Medicare IM given date fields will be blank) Date Medicare IM given:   Date Additional Medicare IM given:    Discharge Disposition:    Per UR Regulation:  Reviewed for med. necessity/level of care/duration of stay  If discussed at Long Length of Stay Meetings, dates discussed:    Comments:

## 2012-05-06 NOTE — Interval H&P Note (Signed)
History and Physical Interval Note:  05/06/2012 1:54 PM  Chad [name]  has presented today for surgery, with the diagnosis of cp  The various methods of treatment have been discussed with the patient and family. After consideration of risks, benefits and other options for treatment, the patient has consented to  Procedure(s): LEFT HEART CATHETERIZATION WITH CORONARY ANGIOGRAM (N/A) as a surgical intervention .  The patient's history has been reviewed, patient examined, no change in status, stable for surgery.  I have reviewed the patient's chart and labs.  Questions were answered to the patient's satisfaction.     Theron Arista [facility] 05/06/2012 1:54 PM

## 2012-05-06 NOTE — Progress Notes (Signed)
*  PRELIMINARY RESULTS* Vascular Ultrasound Carotid Duplex (Doppler) has been completed.   There is no obvious evidence of hemodynamically significant internal carotid artery stenosis >40%. Vertebral arteries are patent with antegrade flow.  05/06/2012 11:06 AM Gertie Fey, RDMS, RDCS

## 2012-05-07 ENCOUNTER — Other Ambulatory Visit: Payer: Self-pay | Admitting: Physician Assistant

## 2012-05-07 ENCOUNTER — Encounter (HOSPITAL_COMMUNITY): Payer: Self-pay | Admitting: Physician Assistant

## 2012-05-07 DIAGNOSIS — N289 Disorder of kidney and ureter, unspecified: Secondary | ICD-10-CM

## 2012-05-07 DIAGNOSIS — E871 Hypo-osmolality and hyponatremia: Secondary | ICD-10-CM | POA: Diagnosis not present

## 2012-05-07 DIAGNOSIS — N179 Acute kidney failure, unspecified: Secondary | ICD-10-CM | POA: Diagnosis not present

## 2012-05-07 DIAGNOSIS — R9431 Abnormal electrocardiogram [ECG] [EKG]: Secondary | ICD-10-CM | POA: Diagnosis not present

## 2012-05-07 DIAGNOSIS — D649 Anemia, unspecified: Secondary | ICD-10-CM

## 2012-05-07 DIAGNOSIS — I10 Essential (primary) hypertension: Secondary | ICD-10-CM | POA: Diagnosis not present

## 2012-05-07 DIAGNOSIS — I4892 Unspecified atrial flutter: Secondary | ICD-10-CM | POA: Diagnosis not present

## 2012-05-07 LAB — CBC
MCH: 31.9 pg (ref 26.0–34.0)
MCHC: 35.5 g/dL (ref 30.0–36.0)
RDW: 12.8 % (ref 11.5–15.5)

## 2012-05-07 LAB — BASIC METABOLIC PANEL
Calcium: 8.4 mg/dL (ref 8.4–10.5)
GFR calc Af Amer: 69 mL/min — ABNORMAL LOW (ref >90)
GFR calc non Af Amer: 59 mL/min — ABNORMAL LOW (ref >90)
Potassium: 4.2 mEq/L (ref 3.5–5.1)
Sodium: 129 mEq/L — ABNORMAL LOW (ref 135–145)

## 2012-05-07 MED ORDER — OMEPRAZOLE 20 MG PO CPDR: 20.0000 mg | DELAYED_RELEASE_CAPSULE | Freq: Every day | ORAL | Status: DC

## 2012-05-07 MED ORDER — RIVAROXABAN 15 MG PO TABS: 15.0000 mg | ORAL_TABLET | Freq: Every day | ORAL | Status: DC

## 2012-05-07 NOTE — Discharge Summary (Signed)
Discharge Summary   Patient ID: Chad [name] MRN: [id number], DOB/AGE: [birth year] 77 y.o. Admit date: 05/05/2012 D/C date:     05/07/2012  Primary Cardiologist: Jens Som  Primary Discharge Diagnoses:  1. Newly recognized atrial flutter (slower 2:1 conduction) - initiated on Xarelto 2. Acute renal insufficiency, resolved - diuretic/ACEI discontinued 3. Hyponatremia, improving 4. HTN  5. Abnormal EKG - cardiac cath with normal coronary arteries 05/06/12 6. Carotid artery calcificiation - dopplers 4/29: no obvious evidence of significant ICA stenosis >40% 7. Anemia - question if related to IVF, will be followed  Secondary Discharge Diagnoses:  1. COPD 2. Colon polyps 3. Diverticulosis of colon (without mention of hemorrhage)  4. History of gastritis (EGD reportedly normal 09/2010) 5. Internal and external hemorrhoids without complication  6. IBS 7. Oral cancer  Hospital Course: Mr. Chad [name] is a 79 y/o M with no prior cardiac history but HTN, GERD, oral cancer, recent LEE who presented to Urgent Care 05/05/12 with complaints of dizziness and found to be in newly recognized atrial flutter. He was remotely evaluated by Dr. Jens Som for atypical CP in 2010, at which time that note references a normal myoview in 2005. He was recently evaluated for peripheral edema and onychomycosis and started on Dyazide and Lamisil earlier this month. Since that time he has felt intermittent dizziness and SOB. He denied CP, orthopnea, PND, syncope or presyncope. Has had one fall in the last year but this is because he tripped over something in the dark. He otherwise reports being steady on his feet but started using a cane just in case of the dizziness. He went to urgent care and EKG was noted to be abnormal with atrial flutter so he was referred to the ER. His initial EKG in the ER showed inferior ST elevation III, avF but also ST-T changes laterally in I, avL, V5-V6. Code STEMI was called but then cancelled  after inferior ST elevation had improved. His HR appeared regular ~100 but flutter waves were clearly apparent intermittently on telemetry. Thus he has 2:1 conduction with slower interval. Initial labs significant for Na 125, Cl 89, Cr 1.48 (all previously normal), AST 61. CXR nonacute. Troponins were negative. 2D echocardiogram showed EF 55% to 60%, mild hypokinesis of the basal septum. See below for report. His Dyazide was held due to hyponatremia and ACEI was held for acute renal insufficiency. He was gently hydrated with improvement in his sodium/Cr. He underwent cardiac cath 05/06/12 given significant ST changes in ER but this showed normal coronary anatomy. Beta blocker has been continued. TSH is normal. Labwork does show a mild anemia which may be decreased compared to admission Hgb given that he received hydration. We will obtain f/u labwork as outpatient. The patient has a remote history of gastritis with normal EGD in 2012 and thus will continue PPI. He denies any sources of bleeding at present. He remains in atrial flutter but is better rate controlled. Symptoms have resolved. Dr. Myrtis Ser has elected to start him on Xarelto. I discussed the patient's Xarelto dose with Dr. Myrtis Ser - CrCl is 49 today, was 43 yesterday. Given his variable renal function this admission & prior history of gastritis, we will initiate 15mg  daily. He will be plugged into the anticoag clinic (first visit is usually at 1 month mark per their protocol). Dr. Myrtis Ser has seen and examined the patient and feels he is stable for discharge. He recommends early post-hospital followup. We will have the patient see Chad Reedy PA-C on 05/12/12 (next  available appointments are 14+ days out) with BMET/CBC that day. The patient should then be referred back to Dr. Jens Som to finalize plans for cardioversion. I have tentatively scheduled him with Dr. Jens Som for 05/29/12 at 10:15am. The patient's prescriptions were sent in to his mail order system but he  was also given a 30-day handwritten RX for Xarelto/Omeprazole to fill in the meantime while waiting for delivery.  Discharge Vitals: Blood pressure 108/58, pulse 86, temperature 98.4 F (36.9 C), temperature source Oral, resp. rate 18, height 6\' 1"  (1.854 m), weight 147 lb 11.2 oz (66.996 kg), SpO2 100.00%.  Labs: Lab Results  Component Value Date   WBC 6.7 05/07/2012   HGB 10.5* 05/07/2012   HCT 29.6* 05/07/2012   MCV 90.0 05/07/2012   PLT 147* 05/07/2012    Recent Labs Lab 05/05/12 1431  05/07/12 0500  NA 125*  < > 129*  K 4.6  < > 4.2  CL 89*  < > 98  CO2 24  < > 24  BUN 19  < > 13  CREATININE 1.48*  < > 1.14  CALCIUM 9.1  < > 8.4  PROT 6.8  --   --   BILITOT 0.5  --   --   ALKPHOS 80  --   --   ALT 37  --   --   AST 61*  --   --   GLUCOSE 113*  < > 89  < > = values in this interval not displayed.  Recent Labs  05/05/12 1431 05/06/12 0211 05/06/12 0922 05/06/12 1736  TROPONINI <0.30 <0.30 <0.30 <0.30   Lab Results  Component Value Date   CHOL 156 05/06/2012   HDL 91 05/06/2012   LDLCALC 56 05/06/2012   TRIG 43 05/06/2012    Diagnostic Studies/Procedures   1. Cardiac catheterization this admission, please see full report and above for summary.  2. 2D Echo 05/05/12 Study Conclusions - Left ventricle: The cavity size was normal. There was mild concentric hypertrophy. Systolic function was normal. The estimated ejection fraction was in the range of 55% to 60%. Mild hypokinesis of the basal septum. - Aortic valve: Mildly to moderately calcified annulus. Mildly thickened, mildly calcified leaflets. Cusp separation was moderately reduced. - Aorta: The aorta was moderately calcified. - Mitral valve: Moderately calcified annulus. - Left atrium: The atrium was mildly dilated. - Right ventricle: The cavity size was normal. Wall thickness was increased. - Right atrium: The atrium was mildly to moderately dilated. - Atrial septum: No defect or patent foramen ovale  was Identified.  3. Dg Chest 2 View 05/05/2012  *RADIOLOGY REPORT*  Clinical Data: Abnormal EKG.  Possible myocardial infarction. History of hypertension.  CHEST - 2 VIEW  Comparison: None.  Findings: The heart size and mediastinal contours are normal. The lungs are clear. There is no pleural effusion or pneumothorax. No acute osseous findings are identified.  There is symmetric biapical pleural thickening. Carotid artery calcifications are noted.  IMPRESSION: No acute cardiopulmonary process.   Original Report Authenticated By: Carey Bullocks, M.D.     Discharge Medications     Medication List    STOP taking these medications       aspirin 81 MG tablet     enalapril 10 MG tablet  Commonly known as:  VASOTEC     triamterene-hydrochlorothiazide 37.5-25 MG per capsule  Commonly known as:  DYAZIDE      TAKE these medications       CALCIUM 1200 PO  Take by  mouth.     metoprolol tartrate 25 MG tablet  Commonly known as:  LOPRESSOR  Take 1 tablet (25 mg total) by mouth 2 (two) times daily.     OCUVITE PO  Take 1 capsule by mouth 2 (two) times daily.     omeprazole 20 MG capsule  Commonly known as:  PRILOSEC  Take 1 capsule (20 mg total) by mouth daily.     RA VITAMIN D-3 2000 UNITS Caps  Generic drug:  Cholecalciferol  Take 1 capsule by mouth daily.     Rivaroxaban 15 MG Tabs tablet  Commonly known as:  XARELTO  Take 1 tablet (15 mg total) by mouth daily with supper.     terbinafine 250 MG tablet  Commonly known as:  LAMISIL  Take 1 tablet (250 mg total) by mouth daily.        Disposition   The patient will be discharged in stable condition to home. Discharge Orders   Future Appointments Provider Department Dept Phone   05/12/2012 12:30 PM [facility] [employer] Main Office [place]) [phone]   05/12/2012 1:00 PM Dyann Kief, PA-C [facility] Main Office [place]) [phone]   05/29/2012 10:15 AM Lewayne Bunting, MD [facility] Main  Office [place]) [phone]   06/06/2012 10:30 AM [facility] [phone]   Future Orders Complete By Expires     Diet - low sodium heart healthy  As directed     Discharge instructions  As directed     Comments:      We have sent in your prescriptions to your mail order. However, you will also get a paper copy so that you can get them filled as soon as you are discharged so you don't miss any doses.    Increase activity slowly  As directed     Comments:      No driving for 2 days. No lifting over 5 lbs for 1 week. No sexual activity for 1 week. Keep procedure site clean & dry. If you notice increased pain, swelling, bleeding or pus, call/return!  You may shower, but no soaking baths/hot tubs/pools for 1 week.      Follow-up Information   Follow up with Chad Reedy, PA-C On 05/12/2012. (At 1pm, arrive at 12:45pm - appointment with Ms. Geni Bers PA-C with labwork that day)    Contact information:   [facility] [address] [place] [address] [phone]       Follow up with Olga Millers, MD On 05/29/2012. (At 10:15am)    Contact information:   [facility] [address] [place] [address] [phone]       Follow up with [facility] On 06/06/2012. (Education Visit in our [facility] 06/06/12 at 10:30am)    Contact information:   [facility] [address] [place] [address] [phone]         Duration of Discharge Encounter: Greater than 30 minutes including physician and PA time.  Signed, Ronie Spies PA-C 05/07/2012, 11:15 AM  Patient seen and examined. I agree with the assessment and plan as detailed above. See also my additional thoughts below.   See my progress note also. I decided patient could go home. Willa Rough, MD, [facility] 05/08/2012 8:37 AM

## 2012-05-07 NOTE — Progress Notes (Signed)
Patient ID: Chad [name], male   DOB: May 25, 1931, 79 y.o.   MRN: [id number]   SUBJECTIVE:  The patient feels well today. Fortunately we were able to proceed with his cardiac catheterization yesterday. He was done from his right groin which is stable. Renal function is checked again today and his creatinine looks even better. The cath showed no significant coronary disease. The plan therefore will be to get him home. Oral anticoagulation will be started. He will be kept off his diuretic. We will arrange for early followup in our office to followup his renal function and to connect him with our anticoagulation clinic with planning for the future.   Filed Vitals:   05/06/12 1351 05/06/12 1534 05/06/12 2040 05/07/12 0639  BP:  132/57 105/66 122/67  Pulse: 87  108 86  Temp:  97.6 F (36.4 C) 98.4 F (36.9 C) 98.4 F (36.9 C)  TempSrc:  Oral Oral Oral  Resp:      Height:      Weight:    147 lb 11.2 oz (66.996 kg)  SpO2:  98% 96% 100%    Intake/Output Summary (Last 24 hours) at 05/07/12 0720 Last data filed at 05/07/12 0541  Gross per 24 hour  Intake 1664.17 ml  Output    250 ml  Net 1414.17 ml    LABS: Basic Metabolic Panel:  Recent Labs  40/98/11 1431 05/06/12 0211 05/06/12 0525 05/07/12 0500  NA 125*  --  126* 129*  K 4.6  --  4.4 4.2  CL 89*  --  93* 98  CO2 24  --  25 24  GLUCOSE 113*  --  89 89  BUN 19  --  18 13  CREATININE 1.48*  --  1.29 1.14  CALCIUM 9.1  --  8.6 8.4  MG  --  2.2  --   --    Liver Function Tests:  Recent Labs  05/05/12 1100 05/05/12 1431  AST 52* 61*  ALT 34 37  ALKPHOS 65 80  BILITOT 0.6 0.5  PROT 6.1 6.8  ALBUMIN 3.5 3.5   No results found for this basename: LIPASE, AMYLASE,  in the last 72 hours CBC:  Recent Labs  05/05/12 1431 05/06/12 0525 05/07/12 0500  WBC 7.1 6.0 6.7  NEUTROABS 5.3  --   --   HGB 13.1 11.4* 10.5*  HCT 36.5* 31.9* 29.6*  MCV 88.6 89.4 90.0  PLT 195 161 147*   Cardiac Enzymes:  Recent Labs  05/06/12 0211 05/06/12 0922 05/06/12 1736  TROPONINI <0.30 <0.30 <0.30   BNP: No components found with this basename: POCBNP,  D-Dimer: No results found for this basename: DDIMER,  in the last 72 hours Hemoglobin A1C: No results found for this basename: HGBA1C,  in the last 72 hours Fasting Lipid Panel:  Recent Labs  05/06/12 0525  CHOL 156  HDL 91  LDLCALC 56  TRIG 43  CHOLHDL 1.7   Thyroid Function Tests:  Recent Labs  05/06/12 0211  TSH 3.549    RADIOLOGY: Dg Chest 2 View  05/05/2012  *RADIOLOGY REPORT*  Clinical Data: Abnormal EKG.  Possible myocardial infarction. History of hypertension.  CHEST - 2 VIEW  Comparison: None.  Findings: The heart size and mediastinal contours are normal. The lungs are clear. There is no pleural effusion or pneumothorax. No acute osseous findings are identified.  There is symmetric biapical pleural thickening. Carotid artery calcifications are noted.  IMPRESSION: No acute cardiopulmonary process.   Original Report Authenticated By:  Carey Bullocks, M.D.     PHYSICAL EXAM  Patient is oriented to person time and place. Affect is normal. Lungs reveal a few scattered rhonchi. There is no respiratory distress. Cardiac exam reveals distant heart sounds. The abdomen is soft. The right groin is stable post cath. There is no peripheral edema.   TELEMETRY: I have reviewed telemetry today May 07, 2012. There is atrial flutter with varying rates. The rate shows reasonable control.   ASSESSMENT AND PLAN:    HYPERTENSION     Patient's blood pressure is under reasonable control. No change in therapy.    COPD    Acute renal failure    Renal function has improved substantially while off diuretic and with some mild hydration. He is stable. Diuretic will not be restarted at the time of this discharge.    Atrial flutter     His atrial flutter rate is controlled. The plan will be to start him on Xarelto today if it can be worked out. We'll arrange  for followup in our anticoagulation clinic to help monitor. Arrange for followup with Dr. Jens Som  2 finalize potential plans for cardioversion. I feel that a dose of 20 mg of Xarelto would be safe for him as long as we arrange for early followup including post hospital chemistry labs to be sure that his creatinine remains in the current range. Also I hope to be sure that we keep him fully anticoagulated before his cardioversion.    Abnormal EKG     Catheterization was done showing normal coronary arteries. No further workup.    Aortic valve sclerosis    Hyponatremia     Sodium continues to improve off diuretic.   Willa Rough 05/07/2012 7:20 AM

## 2012-05-07 NOTE — Progress Notes (Addendum)
Patient given disharge instructions and prescriptions, reviewed with patient medications and when to take them again copy given to patient.  Exit care documents given to patient on Xarelto, and low sodium diet. all questions answered will discharge home as ordered. Lovetta Condie, Randall An RN

## 2012-05-08 ENCOUNTER — Telehealth: Payer: Self-pay | Admitting: Cardiology

## 2012-05-08 NOTE — Telephone Encounter (Signed)
LMTCB

## 2012-05-08 NOTE — Telephone Encounter (Signed)
New problem    Forgot to route message to Triage nurse from yesterday  . appt called over by [facility] PA  On  4/30.    TCM  7-10 days. Patient has appt with Herma Carson on  5/5 @ 1pm.

## 2012-05-09 NOTE — Telephone Encounter (Signed)
LMTCB

## 2012-05-12 ENCOUNTER — Encounter: Payer: Self-pay | Admitting: Physician Assistant

## 2012-05-12 ENCOUNTER — Ambulatory Visit (INDEPENDENT_AMBULATORY_CARE_PROVIDER_SITE_OTHER): Payer: Medicare Other | Admitting: Physician Assistant

## 2012-05-12 ENCOUNTER — Other Ambulatory Visit (INDEPENDENT_AMBULATORY_CARE_PROVIDER_SITE_OTHER): Payer: Medicare Other

## 2012-05-12 VITALS — BP 133/85 | HR 94 | Ht 73.5 in | Wt 151.8 lb

## 2012-05-12 DIAGNOSIS — I498 Other specified cardiac arrhythmias: Secondary | ICD-10-CM

## 2012-05-12 DIAGNOSIS — R0989 Other specified symptoms and signs involving the circulatory and respiratory systems: Secondary | ICD-10-CM

## 2012-05-12 DIAGNOSIS — Z79899 Other long term (current) drug therapy: Secondary | ICD-10-CM | POA: Diagnosis not present

## 2012-05-12 DIAGNOSIS — I10 Essential (primary) hypertension: Secondary | ICD-10-CM

## 2012-05-12 DIAGNOSIS — D649 Anemia, unspecified: Secondary | ICD-10-CM

## 2012-05-12 DIAGNOSIS — E871 Hypo-osmolality and hyponatremia: Secondary | ICD-10-CM

## 2012-05-12 DIAGNOSIS — R000 Tachycardia, unspecified: Secondary | ICD-10-CM

## 2012-05-12 DIAGNOSIS — N179 Acute kidney failure, unspecified: Secondary | ICD-10-CM

## 2012-05-12 DIAGNOSIS — I4892 Unspecified atrial flutter: Secondary | ICD-10-CM

## 2012-05-12 LAB — BASIC METABOLIC PANEL
Chloride: 101 mEq/L (ref 96–112)
Creatinine, Ser: 1.4 mg/dL (ref 0.4–1.5)
GFR: 64.46 mL/min (ref >60.00)
Potassium: 5 mEq/L (ref 3.5–5.1)

## 2012-05-12 MED ORDER — METOPROLOL TARTRATE 25 MG PO TABS: ORAL_TABLET | ORAL | Status: DC

## 2012-05-12 NOTE — Assessment & Plan Note (Signed)
followup renal function today. Creatinine was 1.48 on 05/05/12 and down to 1.14 on 05/07/12

## 2012-05-12 NOTE — Assessment & Plan Note (Signed)
followup BMET today

## 2012-05-12 NOTE — Patient Instructions (Addendum)
**Note De-Identified  Obfuscation** Your physician recommends that you return for lab work in: today  Your physician has recommended you make the following change in your medication: increase Metoprolol to 50 mg in the mornings and 25 mg in the evenings.  Your physician recommends that you schedule a follow-up appointment on: May 22 with Dr. Jens Som

## 2012-05-12 NOTE — Assessment & Plan Note (Signed)
Stable

## 2012-05-12 NOTE — Progress Notes (Signed)
HPI:   This is a 79 year old male patient with history of hypertension, GERD, oral cancer and recent lower extremity edema who presented to urgent care on 05/05/12 with complaints of dizziness and was found to be in new atrial flutter. He was seen remotely by Dr. Jens Som for atypical chest pain 2010 and had a normal Myoview in 2005. Initial EKG in the emergency room showed inferior ST elevation in 3 and aVF and a coated stem he was called and canceled after the inferior ST elevation improved. He underwent cardiac catheterization on 05/06/12 given his EKG changes but the catheter showed normal coronary arteries. 2-D echo showed an ejection fraction of 55-60% with mild hypokinesis of the basal septum. He was started on beta blocker and Xarelto for the atrial fibrillation. He did have acute renal insufficiency with creatinine jumped up to 1.48. His creatinine clearance was 49 therefore as the Xarelto was started at 15 mg daily.  Since the patient's been home he denies any chest pain, palpitations, dyspnea at rest, dizziness, or presyncope. He does get a little out of breath if he tries to do too much but this is not unusual.   Allergies -- Dyazide (Hydrochlorothiazide W-Triamterene)    --  Caused hyponatremia 04/2012  Current Outpatient Prescriptions on File Prior to Visit: Calcium Carbonate-Vit D-Min (CALCIUM 1200 PO), Take by mouth., Disp: , Rfl:   Cholecalciferol (RA VITAMIN D-3) 2000 UNITS CAPS, Take 1 capsule by mouth daily. , Disp: , Rfl:  Multiple Vitamins-Minerals (OCUVITE PO), Take 1 capsule by mouth 2 (two) times daily. , Disp: , Rfl:  omeprazole (PRILOSEC) 20 MG capsule, Take 1 capsule (20 mg total) by mouth daily., Disp: 30 capsule, Rfl: 3 Rivaroxaban (XARELTO) 15 MG TABS tablet, Take 1 tablet (15 mg total) by mouth daily with supper., Disp: 30 tablet, Rfl: 3 terbinafine (LAMISIL) 250 MG tablet, Take 1 tablet (250 mg total) by mouth daily., Disp: 30 tablet, Rfl: 3  No current  facility-administered medications on file prior to visit.   Past Medical History:   Hypertension                                                 Colon polyps                                                 Diverticulosis of colon (without mention of he*              Internal and external hemorrhoids without comp*              Gastritis, chronic                                             Comment:Pt denies bleeding 04/2012. EGD 2012 reportedly               normal.   COPD (chronic obstructive pulmonary disease)                 IBS (irritable bowel syndrome)  Oral cancer                                     02/1997      Atrial flutter                                                 Comment:a. Slow ~100bpm when in 2:1; dx 04/2012. b.               Placed on Xarelto.   Hyponatremia                                                   Comment:04/2012 r/t diuretic.   Acute renal insufficiency                                      Comment:04/2012 - resolved.   Abnormal EKG                                                   Comment:ST changes with normal coronaries by cath               05/06/12.   Carotid artery calcification                                   Comment:By CXR - dopplers 05/06/12 without obvious               evidence of significant ICA stenosis >40%  Past Surgical History:   Oropharyngeal resection                                         Comment:For tongue cancer   HAND SURGERY                                                  HEMORRHOID SURGERY                                            COLONOSCOPY                                      08/29/10        Comment:diverticulosis, internal hemorrhoids   ESOPHAGOGASTRODUODENOSCOPY                       09/20/10        Comment:normal  Review of patient's family  history indicates:   Prostate cancer                Brother                  Diabetes                                                Heart disease                   Brother                    Comment: details unclear   Social History   Marital Status: Divorced            Spouse Name:                      Years of Education:                 Number of children:             Occupational History Occupation          Associate Professor            Comment              Retired                                   Social History Main Topics   Smoking Status: Former Smoker                   Packs/Day: 0.00  Years:           Quit date: 01/09/1996   Smokeless Status: Never Used                       Comment: 10 years    Alcohol Use: Yes               Comment: 2-3 12 oz beers a day   Drug Use: No             Sexual Activity: Not on file        Other Topics            Concern   None on file  Social History Narrative   Lives in [place] by himself    ONG:EXBM of hearing, otherwise see history of present illness   PHYSICAL EXAM: Well-nournished, in no acute distress. Neck: No JVD, HJR, Bruit, or thyroid enlargement  Lungs: No tachypnea, clear without wheezing, rales, or rhonchi  Cardiovascular: irregular irregular, PMI not displaced, heart sounds distant, no murmurs, gallops, bruit, thrill, or heave.  Abdomen: BS normal. Soft without organomegaly, masses, lesions or tenderness.  Extremities: without cyanosis, clubbing or edema. Good distal pulses bilateral  SKin: Warm, no lesions or rashes   Musculoskeletal: No deformities  Neuro: no focal signs  BP 133/85  Pulse 94  Ht 6' 1.5" (1.867 m)  Wt 151 lb 12.8 oz (68.856 kg)  BMI 19.75 kg/m2   WUX:LKGMWN flutter at 95 beats per minute poor R-wave progression anteriorly, nonspecific ST changes   Coronary angiography: Coronary dominance: Left  Left mainstem: The left main coronary is short and appears normal.  Left anterior descending (  LAD): The LAD is a large vessel that is very tortuous in the mid and distal vessel. It appears normal.  Left circumflex (LCx): The left circumflex is a  dominant vessel. It has minor irregularities in the proximal vessel less than 10%.  Right coronary artery (RCA): This is a small nondominant vessel and appears normal.  Left ventriculography: Not performed  Final Conclusions:   1. Normal coronary anatomy.  Recommendations: Medical treatment of his atrial flutter.  Theron Arista [facility] 05/06/2012, 2:36 PM  1. Cardiac catheterization this admission, please see full report and above for summary.  2. 2D Echo 05/05/12 Study Conclusions - Left ventricle: The cavity size was normal. There was mild concentric hypertrophy. Systolic function was normal. The estimated ejection fraction was in the range of 55% to 60%. Mild hypokinesis of the basal septum. - Aortic valve: Mildly to moderately calcified annulus. Mildly thickened, mildly calcified leaflets. Cusp separation was moderately reduced. - Aorta: The aorta was moderately calcified. - Mitral valve: Moderately calcified annulus. - Left atrium: The atrium was mildly dilated. - Right ventricle: The cavity size was normal. Wall thickness was increased. - Right atrium: The atrium was mildly to moderately dilated. - Atrial septum: No defect or patent foramen ovale was Identified.

## 2012-05-12 NOTE — Assessment & Plan Note (Signed)
Patient has no onset atrial flutter. His heart rate at rest is 95 today. I will increase his Lopressor to 50 mg in the morning 25 at night. Continue Xarelto. He has an appointment to see Dr. Jens Som back on 05/29/12 to finalize plans for cardioversion. We will check BMET./CBC today

## 2012-05-17 ENCOUNTER — Ambulatory Visit (INDEPENDENT_AMBULATORY_CARE_PROVIDER_SITE_OTHER): Payer: Medicare Other | Admitting: Family Medicine

## 2012-05-17 VITALS — BP 130/80 | HR 78 | Temp 98.1°F | Resp 16 | Ht 73.0 in | Wt 149.0 lb

## 2012-05-17 DIAGNOSIS — K529 Noninfective gastroenteritis and colitis, unspecified: Secondary | ICD-10-CM

## 2012-05-17 DIAGNOSIS — K5289 Other specified noninfective gastroenteritis and colitis: Secondary | ICD-10-CM

## 2012-05-17 MED ORDER — METRONIDAZOLE 250 MG PO TABS: 250.0000 mg | ORAL_TABLET | Freq: Three times a day (TID) | ORAL | Status: DC

## 2012-05-17 NOTE — Progress Notes (Signed)
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  No diarrhea.  Mild nausea. No blood in stool.  No fever. Occasional sharp pain left chest when expelling gas. No abdominal pain  Objective:  NAD, alert, appropriate, good color Oroph:  Clear Neck:  Supple, surgical scar right anterior upper neck Chest:  Distant breath sounds Heart:  Irreg, no murmur appreciated though sounds are distant Abdomen:  Periumbilical nontender 1.5 cm hernia, no HSM, no masses, no bruit, nontender, hyperactive BS Ext:  Mild bilateral pedal edema Skin:  No rashes.  Assessment:  Gastroenteritis (?giardia)  Plan:  Metronidazole 250 tid x 7 days. Recheck 72 hours if not improving  Signed, Elvina Sidle, MD

## 2012-05-21 ENCOUNTER — Ambulatory Visit (INDEPENDENT_AMBULATORY_CARE_PROVIDER_SITE_OTHER): Payer: Medicare Other | Admitting: Family Medicine

## 2012-05-21 VITALS — BP 108/66 | HR 104 | Temp 97.5°F | Resp 18 | Ht 73.0 in | Wt 149.4 lb

## 2012-05-21 DIAGNOSIS — B351 Tinea unguium: Secondary | ICD-10-CM | POA: Diagnosis not present

## 2012-05-21 DIAGNOSIS — K5289 Other specified noninfective gastroenteritis and colitis: Secondary | ICD-10-CM

## 2012-05-21 DIAGNOSIS — K529 Noninfective gastroenteritis and colitis, unspecified: Secondary | ICD-10-CM

## 2012-05-21 MED ORDER — METRONIDAZOLE 250 MG PO TABS: 250.0000 mg | ORAL_TABLET | Freq: Three times a day (TID) | ORAL | Status: DC

## 2012-05-21 NOTE — Progress Notes (Signed)
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  He also wants followup for the right great toenail fungus.    Objective:  NAD Right great toenail is clearing Abdomen:  No HSM, small umbilical hernia, hyperactive BS, no masses, nontender  Assessment:  Resolving onychomycosis, improving gastroenteritis.

## 2012-05-29 ENCOUNTER — Encounter: Payer: Self-pay | Admitting: Cardiology

## 2012-05-29 ENCOUNTER — Ambulatory Visit (INDEPENDENT_AMBULATORY_CARE_PROVIDER_SITE_OTHER): Payer: Medicare Other | Admitting: Cardiology

## 2012-05-29 VITALS — BP 118/60 | HR 104 | Ht 73.0 in | Wt 157.0 lb

## 2012-05-29 DIAGNOSIS — I10 Essential (primary) hypertension: Secondary | ICD-10-CM | POA: Diagnosis not present

## 2012-05-29 DIAGNOSIS — I4892 Unspecified atrial flutter: Secondary | ICD-10-CM

## 2012-05-29 DIAGNOSIS — N179 Acute kidney failure, unspecified: Secondary | ICD-10-CM

## 2012-05-29 LAB — CBC WITH DIFFERENTIAL/PLATELET
Basophils Relative: 1.1 % (ref 0.0–3.0)
Eosinophils Relative: 2.4 % (ref 0.0–5.0)
Hemoglobin: 10.7 g/dL — ABNORMAL LOW (ref 13.0–17.0)
Lymphocytes Relative: 17.7 % (ref 12.0–46.0)
Monocytes Relative: 14.8 % — ABNORMAL HIGH (ref 3.0–12.0)
Neutro Abs: 4 10*3/uL (ref 1.4–7.7)
Neutrophils Relative %: 64 % (ref 43.0–77.0)
RBC: 3.36 Mil/uL — ABNORMAL LOW (ref 4.22–5.81)
WBC: 6.3 10*3/uL (ref 4.5–10.5)

## 2012-05-29 LAB — BASIC METABOLIC PANEL
BUN: 15 mg/dL (ref 6–23)
CO2: 30 mEq/L (ref 19–32)
Chloride: 103 mEq/L (ref 96–112)
Creatinine, Ser: 1.1 mg/dL (ref 0.4–1.5)
Potassium: 5 mEq/L (ref 3.5–5.1)

## 2012-05-29 NOTE — Assessment & Plan Note (Signed)
Patient remains in atrial flutter. Continue metoprolol for rate control. Continue xeralto. Recheck renal function. Patient presently on 15 mg daily. We will adjust dose based on GFR. Embolic risk factors of age greater than 62 and hypertension. I will ask one of our electrophysiologists to review and consider ablation to avoid long-term anticoagulation.

## 2012-05-29 NOTE — Progress Notes (Signed)
HPI: Pleasant male for fu of atrial flutter. Patient presented in April of 2014 with dizziness and noted to be in atrial flutter. I did not see him during that hospitalization. There was a question of ST elevation. He underwent cardiac catheterization on 05/06/12 which revealed normal coronary arteries. 2-D echo 4/14 showed an ejection fraction of 55-60% with mild hypokinesis of the basal septum. He was started on beta blocker and Xarelto for the atrial flutter. Carotid Dopplers April 2014 showed no significant stenosis. TSH normal. Since discharge the patient denies dyspnea, chest pain, palpitations or syncope. Mild pedal edema.   Current Outpatient Prescriptions  Medication Sig Dispense Refill  . Calcium Carbonate-Vit D-Min (CALCIUM 1200 PO) Take by mouth.      . Cholecalciferol (RA VITAMIN D-3) 2000 UNITS CAPS Take 1 capsule by mouth daily.       . metoprolol tartrate (LOPRESSOR) 25 MG tablet Take 50 mg in the am and 25 mg in the pm  270 tablet  1  . metroNIDAZOLE (FLAGYL) 250 MG tablet Take 1 tablet (250 mg total) by mouth 3 (three) times daily.  15 tablet  0  . Multiple Vitamins-Minerals (OCUVITE PO) Take 1 capsule by mouth 2 (two) times daily.       [name] omeprazole (PRILOSEC) 20 MG capsule Take 1 capsule (20 mg total) by mouth daily.  30 capsule  3  . Rivaroxaban (XARELTO) 15 MG TABS tablet Take 1 tablet (15 mg total) by mouth daily with supper.  30 tablet  3   No current facility-administered medications for this visit.     Past Medical History  Diagnosis Date  . Hypertension   . Colon polyps   . Diverticulosis of colon (without mention of hemorrhage)   . Internal and external hemorrhoids without complication   . Gastritis, chronic     Pt denies bleeding 04/2012. EGD 2012 reportedly normal.  . COPD (chronic obstructive pulmonary disease)   . IBS (irritable bowel syndrome)   . 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 Atrial flutter     a. Slow ~100bpm when in 2:1; dx 04/2012. b. Placed on Xarelto.  .  Hyponatremia     04/2012 r/t diuretic.  [name] Acute renal insufficiency     04/2012 - resolved.  . Abnormal EKG     ST changes with normal coronaries by cath 05/06/12.  . Carotid artery calcification     By CXR - dopplers 05/06/12 without obvious evidence of significant ICA stenosis >40%    Past Surgical History  Procedure Laterality Date  . Oropharyngeal resection      For tongue cancer  . Hand surgery    . Hemorrhoid surgery    . Colonoscopy  08/29/10    diverticulosis, internal hemorrhoids  . Esophagogastroduodenoscopy  09/20/10    normal    History   Social History  . Marital Status: Divorced    Spouse Name: N/A    Number of Children: N/A  . Years of Education: N/A   Occupational History  . Retired    Social History Main Topics  . Smoking status: Former Smoker    Quit date: 01/09/1996  . Smokeless tobacco: Never Used     Comment: 10 years   . Alcohol Use: Yes     Comment: 2-3 12 oz beers a day  . Drug Use: No  . Sexually Active: Not on file   Other Topics Concern  . Not on file   Social History Narrative   Lives in [place] by  himself    ROS: no fevers or chills, productive cough, hemoptysis, dysphasia, odynophagia, melena, hematochezia, dysuria, hematuria, rash, seizure activity, orthopnea, PND, claudication. Remaining systems are negative.  Physical Exam: Well-developed well-nourished in no acute distress.  Skin is warm and dry.  HEENT is normal.  Neck is supple.  Chest is clear to auscultation with normal expansion.  Cardiovascular exam is irregular Abdominal exam nontender or distended. No masses palpated. Extremities show trace edema. neuro grossly intact  ECG Atrial flutter at a rate of 104. No ST changes.

## 2012-05-29 NOTE — Assessment & Plan Note (Signed)
Blood pressure controlled. Continue present medications. 

## 2012-05-29 NOTE — Assessment & Plan Note (Signed)
Patient had renal insufficiency in the hospital.repeat potassium and renal function today.

## 2012-05-29 NOTE — Patient Instructions (Addendum)
Your physician recommends that you schedule a follow-up appointment in: 3 MONTHS WITH DR Jens Som  Your physician recommends that you HAVE LAB WORK TODAY  REFERRAL TO EP FOR ATRIAL FLUTTER ABLATION

## 2012-06-03 ENCOUNTER — Telehealth: Payer: Self-pay | Admitting: Cardiology

## 2012-06-03 NOTE — Telephone Encounter (Signed)
Spoke with pt, aware of lab results. 

## 2012-06-03 NOTE — Telephone Encounter (Signed)
New problem ° ° °Pt returning your call. Please call pt. °

## 2012-06-06 ENCOUNTER — Ambulatory Visit (INDEPENDENT_AMBULATORY_CARE_PROVIDER_SITE_OTHER): Payer: Medicare Other | Admitting: *Deleted

## 2012-06-06 DIAGNOSIS — I4892 Unspecified atrial flutter: Secondary | ICD-10-CM | POA: Diagnosis not present

## 2012-06-06 LAB — BASIC METABOLIC PANEL
CO2: 28 mEq/L (ref 19–32)
Calcium: 8.4 mg/dL (ref 8.4–10.5)
Creatinine, Ser: 1.1 mg/dL (ref 0.4–1.5)
GFR: 87.6 mL/min (ref >60.00)
Glucose, Bld: 94 mg/dL (ref 70–99)
Sodium: 135 mEq/L (ref 135–145)

## 2012-06-06 LAB — CBC
HCT: 30.3 % — ABNORMAL LOW (ref 39.0–52.0)
Hemoglobin: 10.4 g/dL — ABNORMAL LOW (ref 13.0–17.0)
MCV: 93.6 fl (ref 78.0–100.0)
RDW: 13.7 % (ref 11.5–14.6)
WBC: 6.6 10*3/uL (ref 4.5–10.5)

## 2012-06-06 NOTE — Progress Notes (Signed)
Pt was started on Xarelto for Aflutter on 04/2012.    Reviewed patients medication list.  Pt is not  currently on any combined P-gp and strong CYP3A4 inhibitors/inducers (ketoconazole, traconazole, ritonavir, carbamazepine, phenytoin, rifampin, St. John's wort).  Reviewed labs.  SCr 1.1 Weight 71 Kg , CrCl- 55.  Dose inappropriate  based on CrCl.  Thus samples given for 20mg s dose QD with evening meal. Pt sees Dr Johney Frame on 06/19/12 at that time he might be considered for Ablation.   Hgb and HCT 10.4/30.3 .  A full discussion of the nature of anticoagulants has been carried out.  A benefit/risk analysis has been presented to the patient, so that they understand the justification for choosing anticoagulation with Xarelto at this time.  The need for compliance is stressed.  Pt is aware to take the medication once daily with the largest meal of the day.  Side effects of potential bleeding are discussed, including unusual colored urine or stools, coughing up blood or coffee ground emesis, nose bleeds or serious fall or head trauma.  Discussed signs and symptoms of stroke. The patient should avoid any OTC items containing aspirin or ibuprofen.  Avoid alcohol consumption.   Call if any signs of abnormal bleeding.  Discussed financial obligations and resolved any difficulty in obtaining medication.  Next lab test test in 6 months.

## 2012-06-06 NOTE — Patient Instructions (Signed)

## 2012-06-11 DIAGNOSIS — M65979 Unspecified synovitis and tenosynovitis, unspecified ankle and foot: Secondary | ICD-10-CM | POA: Diagnosis not present

## 2012-06-11 DIAGNOSIS — M25579 Pain in unspecified ankle and joints of unspecified foot: Secondary | ICD-10-CM | POA: Diagnosis not present

## 2012-06-11 DIAGNOSIS — M79609 Pain in unspecified limb: Secondary | ICD-10-CM | POA: Diagnosis not present

## 2012-06-11 DIAGNOSIS — M659 Synovitis and tenosynovitis, unspecified: Secondary | ICD-10-CM | POA: Diagnosis not present

## 2012-06-11 DIAGNOSIS — M12279 Villonodular synovitis (pigmented), unspecified ankle and foot: Secondary | ICD-10-CM | POA: Diagnosis not present

## 2012-06-19 ENCOUNTER — Encounter: Payer: Self-pay | Admitting: Internal Medicine

## 2012-06-19 ENCOUNTER — Encounter: Payer: Self-pay | Admitting: *Deleted

## 2012-06-19 ENCOUNTER — Ambulatory Visit (INDEPENDENT_AMBULATORY_CARE_PROVIDER_SITE_OTHER): Payer: Medicare Other | Admitting: Internal Medicine

## 2012-06-19 ENCOUNTER — Other Ambulatory Visit: Payer: Self-pay | Admitting: *Deleted

## 2012-06-19 VITALS — BP 130/70 | HR 102 | Ht 73.0 in | Wt 164.0 lb

## 2012-06-19 DIAGNOSIS — I10 Essential (primary) hypertension: Secondary | ICD-10-CM

## 2012-06-19 DIAGNOSIS — I4892 Unspecified atrial flutter: Secondary | ICD-10-CM | POA: Diagnosis not present

## 2012-06-19 NOTE — Patient Instructions (Addendum)
Your physician has recommended that you have an ablation. Catheter ablation is a medical procedure used to treat some cardiac arrhythmias (irregular heartbeats). During catheter ablation, a long, thin, flexible tube is put into a blood vessel in your groin (upper thigh), or neck. This tube is called an ablation catheter. It is then guided to your heart through the blood vessel. Radio frequency waves destroy small areas of heart tissue where abnormal heartbeats may cause an arrhythmia to start. Please see the instruction sheet given to you today.  See instructions sheet

## 2012-06-20 DIAGNOSIS — M659 Synovitis and tenosynovitis, unspecified: Secondary | ICD-10-CM | POA: Diagnosis not present

## 2012-06-20 DIAGNOSIS — M25579 Pain in unspecified ankle and joints of unspecified foot: Secondary | ICD-10-CM | POA: Diagnosis not present

## 2012-06-20 DIAGNOSIS — M65979 Unspecified synovitis and tenosynovitis, unspecified ankle and foot: Secondary | ICD-10-CM | POA: Diagnosis not present

## 2012-06-20 DIAGNOSIS — M12279 Villonodular synovitis (pigmented), unspecified ankle and foot: Secondary | ICD-10-CM | POA: Diagnosis not present

## 2012-06-30 ENCOUNTER — Encounter (HOSPITAL_COMMUNITY): Payer: Self-pay | Admitting: Respiratory Therapy

## 2012-07-01 ENCOUNTER — Other Ambulatory Visit (INDEPENDENT_AMBULATORY_CARE_PROVIDER_SITE_OTHER): Payer: Medicare Other

## 2012-07-01 DIAGNOSIS — N289 Disorder of kidney and ureter, unspecified: Secondary | ICD-10-CM | POA: Diagnosis not present

## 2012-07-01 DIAGNOSIS — H353190 Nonexudative age-related macular degeneration, unspecified eye, stage unspecified: Secondary | ICD-10-CM | POA: Diagnosis not present

## 2012-07-01 DIAGNOSIS — H2510 Age-related nuclear cataract, unspecified eye: Secondary | ICD-10-CM | POA: Diagnosis not present

## 2012-07-01 DIAGNOSIS — D649 Anemia, unspecified: Secondary | ICD-10-CM

## 2012-07-01 LAB — BASIC METABOLIC PANEL
Chloride: 105 mEq/L (ref 96–112)
Creatinine, Ser: 1.2 mg/dL (ref 0.4–1.5)
Potassium: 4 mEq/L (ref 3.5–5.1)

## 2012-07-01 LAB — CBC
HCT: 34.6 % — ABNORMAL LOW (ref 39.0–52.0)
Hemoglobin: 11.4 g/dL — ABNORMAL LOW (ref 13.0–17.0)
RBC: 3.62 Mil/uL — ABNORMAL LOW (ref 4.22–5.81)
WBC: 8.1 10*3/uL (ref 4.5–10.5)

## 2012-07-06 NOTE — Progress Notes (Signed)
 Primary Care Physician: GUEST, CHRIS WARREN, MD Referring Physician:  Dr Crenshaw  Chad [name] is a 79 y.o. male with a h/o atrial flutter who presents for EP consultation.  He presented in April of 2014 with dizziness and was noted to be in atrial flutter. He underwent cardiac catheterization on 05/06/12 which revealed normal coronary arteries. 2-D echo 4/14 showed an ejection fraction of 55-60% with mild hypokinesis of the basal septum. He was started on beta blocker and Xarelto for the atrial flutter. He has been managed by Dr Crenshaw since that time.  He has fatigue with atrial flutter. Today, he denies symptoms of palpitations, chest pain, shortness of breath, orthopnea, PND, lower extremity edema, dizziness, presyncope, syncope, or neurologic sequela. The patient is tolerating medications without difficulties and is otherwise without complaint today.   Past Medical History  Diagnosis Date  . Hypertension   . Colon polyps   . Diverticulosis of colon (without mention of hemorrhage)   . Internal and external hemorrhoids without complication   . Gastritis, chronic     Pt denies bleeding 04/2012. EGD 2012 reportedly normal.  . COPD (chronic obstructive pulmonary disease)   . IBS (irritable bowel syndrome)   . 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 Atrial flutter     a. Slow ~100bpm when in 2:1; dx 04/2012. b. Placed on Xarelto.  . Hyponatremia     04/2012 r/t diuretic.  . Acute renal insufficiency     04/2012 - resolved.  . Abnormal EKG     ST changes with normal coronaries by cath 05/06/12.  . Carotid artery calcification     By CXR - dopplers 05/06/12 without obvious evidence of significant ICA stenosis >40%   Past Surgical History  Procedure Laterality Date  . Oropharyngeal resection      For tongue cancer  . Hand surgery    . Hemorrhoid surgery    . Colonoscopy  08/29/10    diverticulosis, internal hemorrhoids  . Esophagogastroduodenoscopy  09/20/10    normal    Current Outpatient  Prescriptions  Medication Sig Dispense Refill  . Calcium Carbonate-Vit D-Min (CALCIUM 1200 PO) Take 1 tablet by mouth daily.       . Cholecalciferol (RA VITAMIN D-3) 2000 UNITS CAPS Take 2,000 Units by mouth daily.       . Multiple Vitamins-Minerals (OCUVITE PO) Take 1 capsule by mouth 2 (two) times daily.       . omeprazole (PRILOSEC) 20 MG capsule Take 1 capsule (20 mg total) by mouth daily.  30 capsule  3  . Rivaroxaban (XARELTO) 20 MG TABS Take 20 mg by mouth daily.      . metoprolol tartrate (LOPRESSOR) 25 MG tablet Take 25-50 mg by mouth 2 (two) times daily. Take 50mg in the morning and 25mg in the evening       No current facility-administered medications for this visit.    Allergies  Allergen Reactions  . Dyazide (Hydrochlorothiazide W-Triamterene)     Caused hyponatremia 04/2012    History   Social History  . Marital Status: Divorced    Spouse Name: N/A    Number of Children: N/A  . Years of Education: N/A   Occupational History  . Retired    Social History Main Topics  . Smoking status: Former Smoker    Quit date: 01/09/1996  . Smokeless tobacco: Never Used     Comment: 10 years   . Alcohol Use: Yes     Comment: 2-3 12 oz beers a   day  . Drug Use: No  . Sexually Active: Not on file   Other Topics Concern  . Not on file   Social History Narrative   Lives in condo by himself    Family History  Problem Relation Age of Onset  . Prostate cancer Brother   . Diabetes    . Heart disease Brother     details unclear    ROS- All systems are reviewed and negative except as per the HPI above  Physical Exam: Filed Vitals:   06/19/12 1458  BP: 130/70  Pulse: 102  Height: 6' 1" (1.854 m)  Weight: 164 lb (74.39 kg)    GEN- The patient is well appearing, alert and oriented x 3 today.   Head- normocephalic, atraumatic Eyes-  Sclera clear, conjunctiva pink Ears- hearing intact Oropharynx- clear Neck- supple, no JVP Lymph- no cervical lymphadenopathy Lungs-  Clear to ausculation bilaterally, normal work of breathing Heart- Regular rate and rhythm, no murmurs, rubs or gallops, PMI not laterally displaced GI- soft, NT, ND, + BS Extremities- no clubbing, cyanosis, or edema MS- no significant deformity or atrophy Skin- no rash or lesion Psych- euthymic mood, full affect Neuro- strength and sensation are intact  EKG today reveals atrial flutter with 2:1 AV conduction, V rate 102 bpm, nonspecific ST/T changes  Assessment and Plan:  1. Symptomatic typical appearing atrial flutter  Therapeutic strategies for atrial flutter including medicine and ablation were discussed in detail with the patient today. Risk, benefits, and alternatives to EP study and radiofrequency ablation were also discussed in detail today. These risks include but are not limited to stroke, bleeding, vascular damage, tamponade, perforation, damage to the heart and other structures, AV block requiring pacemaker, worsening renal function, and death. The patient understands these risk and wishes to proceed.  We will therefore proceed with catheter ablation at the next available time.  2. HTN Stable No change required today   

## 2012-07-08 ENCOUNTER — Encounter (HOSPITAL_COMMUNITY): Payer: Self-pay | Admitting: Anesthesiology

## 2012-07-08 ENCOUNTER — Encounter (HOSPITAL_COMMUNITY)
Admission: RE | Disposition: A | Discharge status: Home / Self Care | Payer: Self-pay | Source: Ambulatory Visit | Attending: Internal Medicine

## 2012-07-08 ENCOUNTER — Ambulatory Visit (HOSPITAL_COMMUNITY)
Admission: RE | Admit: 2012-07-08 | Discharge: 2012-07-08 | Disposition: A | Discharge status: Home / Self Care | Payer: Medicare Other | Source: Ambulatory Visit | Attending: Internal Medicine | Admitting: Internal Medicine

## 2012-07-08 ENCOUNTER — Ambulatory Visit (HOSPITAL_COMMUNITY): Payer: Medicare Other | Admitting: Anesthesiology

## 2012-07-08 DIAGNOSIS — I440 Atrioventricular block, first degree: Secondary | ICD-10-CM | POA: Diagnosis not present

## 2012-07-08 DIAGNOSIS — J449 Chronic obstructive pulmonary disease, unspecified: Secondary | ICD-10-CM | POA: Diagnosis not present

## 2012-07-08 DIAGNOSIS — K589 Irritable bowel syndrome without diarrhea: Secondary | ICD-10-CM | POA: Diagnosis not present

## 2012-07-08 DIAGNOSIS — I4892 Unspecified atrial flutter: Secondary | ICD-10-CM | POA: Diagnosis not present

## 2012-07-08 DIAGNOSIS — I4891 Unspecified atrial fibrillation: Secondary | ICD-10-CM | POA: Diagnosis not present

## 2012-07-08 DIAGNOSIS — J4489 Other specified chronic obstructive pulmonary disease: Secondary | ICD-10-CM | POA: Insufficient documentation

## 2012-07-08 DIAGNOSIS — I10 Essential (primary) hypertension: Secondary | ICD-10-CM | POA: Diagnosis not present

## 2012-07-08 HISTORY — PX: ATRIAL FLUTTER ABLATION: SHX5733

## 2012-07-08 HISTORY — DX: Unspecified osteoarthritis, unspecified site: M19.90

## 2012-07-08 SURGERY — ATRIAL FLUTTER ABLATION
Anesthesia: General

## 2012-07-08 MED ORDER — ACETAMINOPHEN 10 MG/ML IV SOLN: 1000.0000 mg | Freq: Once | INTRAVENOUS | Status: DC | PRN

## 2012-07-08 MED ORDER — HYDROMORPHONE HCL PF 1 MG/ML IJ SOLN: 0.2500 mg | INTRAMUSCULAR | Status: DC | PRN

## 2012-07-08 MED ORDER — RIVAROXABAN 20 MG PO TABS
20.0000 mg | ORAL_TABLET | Freq: Every day | ORAL | Status: DC
Start: 2012-07-08 — End: 2012-07-08
  Administered 2012-07-08: 20 mg via ORAL
  Filled 2012-07-08: qty 1

## 2012-07-08 MED ORDER — SODIUM CHLORIDE 0.9 % IV SOLN
INTRAVENOUS | Status: DC | PRN
  Administered 2012-07-08 (×2): via INTRAVENOUS

## 2012-07-08 MED ORDER — ONDANSETRON HCL 4 MG/2ML IJ SOLN
INTRAMUSCULAR | Status: DC | PRN
  Administered 2012-07-08: 4 mg via INTRAVENOUS

## 2012-07-08 MED ORDER — SODIUM CHLORIDE 0.9 % IJ SOLN: 3.0000 mL | INTRAMUSCULAR | Status: DC | PRN

## 2012-07-08 MED ORDER — ACETAMINOPHEN 325 MG PO TABS: 650.0000 mg | ORAL_TABLET | ORAL | Status: DC | PRN

## 2012-07-08 MED ORDER — FENTANYL CITRATE 0.05 MG/ML IJ SOLN
INTRAMUSCULAR | Status: DC | PRN
  Administered 2012-07-08 (×2): 25 ug via INTRAVENOUS

## 2012-07-08 MED ORDER — ONDANSETRON HCL 4 MG/2ML IJ SOLN: 4.0000 mg | Freq: Four times a day (QID) | INTRAMUSCULAR | Status: DC | PRN

## 2012-07-08 MED ORDER — SODIUM CHLORIDE 0.9 % IJ SOLN: 3.0000 mL | Freq: Two times a day (BID) | INTRAMUSCULAR | Status: DC

## 2012-07-08 MED ORDER — PHENYLEPHRINE HCL 10 MG/ML IJ SOLN
INTRAMUSCULAR | Status: DC | PRN
  Administered 2012-07-08 (×2): 40 ug via INTRAVENOUS
  Administered 2012-07-08: 80 ug via INTRAVENOUS

## 2012-07-08 MED ORDER — PANTOPRAZOLE SODIUM 40 MG PO TBEC
40.0000 mg | DELAYED_RELEASE_TABLET | Freq: Every day | ORAL | Status: DC
  Administered 2012-07-08: 40 mg via ORAL
  Filled 2012-07-08: qty 1

## 2012-07-08 MED ORDER — PROPOFOL 10 MG/ML IV BOLUS
INTRAVENOUS | Status: DC | PRN
  Administered 2012-07-08: 130 mg via INTRAVENOUS

## 2012-07-08 MED ORDER — LIDOCAINE HCL (CARDIAC) 20 MG/ML IV SOLN
INTRAVENOUS | Status: DC | PRN
  Administered 2012-07-08: 40 mg via INTRAVENOUS

## 2012-07-08 MED ORDER — SODIUM CHLORIDE 0.9 % IV SOLN: 250.0000 mL | INTRAVENOUS | Status: DC | PRN

## 2012-07-08 MED ORDER — HYDROCODONE-ACETAMINOPHEN 5-325 MG PO TABS: 1.0000 | ORAL_TABLET | ORAL | Status: DC | PRN

## 2012-07-08 MED ORDER — ONDANSETRON HCL 4 MG/2ML IJ SOLN: 4.0000 mg | Freq: Once | INTRAMUSCULAR | Status: DC | PRN

## 2012-07-08 NOTE — Progress Notes (Signed)
Pt finished bedrest, ambulated in hallway without any issues. Groin site stable. Discharge instructions reviewed with pt. Chad [name] will take him home.

## 2012-07-08 NOTE — Discharge Summary (Signed)
ELECTROPHYSIOLOGY PROCEDURE DISCHARGE SUMMARY    Patient ID: Chad [name],  MRN: [id number], DOB/AGE: 1931/07/02 79 y.o.  Admit date: 07/08/2012 Discharge date: 07/08/2012  Primary Care Physician: Robert Bellow, MD Primary Cardiologist: Olga Millers, MD  Primary Discharge Diagnosis:  Atrial flutter status post ablation this admission  Secondary Discharge Diagnosis:  1. Hypertension 2.  COPD 3.  IBS 4.  Normal cardiac catheterization in April 2014  Procedures This Admission: 1.  Electrophysiology study and radiofrequency catheter ablation on 07-08-2012 by Dr Johney Frame.  This study demonstrated isthmus-dependent right atrial flutter upon presentation, successfully ablated along the usual cavotricuspid isthmus; complete bidirectional cavotricuspid isthmus block achieved. There were no inducible arrhythmias following ablation and no early apparent complications.  Brief HPI: Chad [name] is a 79 y.o. male with a h/o atrial flutter who was referred for EP consultation for evaluation of atrial flutter. He presented in April of 2014 with dizziness and was noted to be in atrial flutter. He underwent cardiac catheterization on 05/06/12 which revealed normal coronary arteries. 2-D echo 4/14 showed an ejection fraction of 55-60% with mild hypokinesis of the basal septum. He was started on beta blocker and Xarelto for the atrial flutter. He has been managed by Dr Jens Som since that time. He has fatigue with atrial flutter.  Risks, benefits, and alternatives to ablation were reviewed with the patient who wished to proceed.   Hospital Course:  The patient was admitted after 4 weeks of appropriate anticoagulation and underwent EPS/RFCA with details as outlined above.  He was monitored on telemetry post procedure which demonstrated sinus rhythm.  His groin was without complication. He was seen by Dr Johney Frame and considered stable for discharge to home. Wound care and restrictions have been reviewed with  the patient.  He will follow up with Dr Johney Frame in 4 weeks.   Discharge Vitals: Blood pressure 147/68, pulse 63, temperature 97.2 F (36.2 C), temperature source Oral, resp. rate 18, height 6\' 2"  (1.88 m), weight 165 lb (74.844 kg), SpO2 100.00%.    Labs:   Lab Results  Component Value Date   WBC 8.1 07/01/2012   HGB 11.4* 07/01/2012   HCT 34.6* 07/01/2012   MCV 95.8 07/01/2012   PLT 207.0 07/01/2012   No results found for this basename: NA, K, CL, CO2, BUN, CREATININE, CALCIUM, LABALBU, PROT, BILITOT, ALKPHOS, ALT, AST, GLUCOSE,  in the last 168 hours   Discharge Medications:    Medication List    STOP taking these medications       metoprolol tartrate 25 MG tablet  Commonly known as:  LOPRESSOR      TAKE these medications       CALCIUM 1200 PO  Take 1 tablet by mouth daily.     OCUVITE PO  Take 1 capsule by mouth 2 (two) times daily.     omeprazole 20 MG capsule  Commonly known as:  PRILOSEC  Take 1 capsule (20 mg total) by mouth daily.     RA VITAMIN D-3 2000 UNITS Caps  Generic drug:  Cholecalciferol  Take 2,000 Units by mouth daily.     XARELTO 20 MG Tabs  Generic drug:  Rivaroxaban  Take 20 mg by mouth daily.        Follow up Appointments:   Future Appointments Provider Department Dept Phone   08/11/2012 11:15 AM Hillis Range, MD [facility] Main Office [place]) [phone]   08/25/2012 2:15 PM Lewayne Bunting, MD [facility] Main Office [place]) [phone]  Follow-up Information   Follow up with Hillis Range, MD On 08/11/2012. (11:15 am)    Contact information:   [address] [place] [address] [phone]       Duration of Discharge Encounter: Greater than 30 minutes including physician time.  Signed, Hillis Range, MD

## 2012-07-08 NOTE — Anesthesia Procedure Notes (Signed)
Procedure Name: LMA Insertion Date/Time: 07/08/2012 11:56 AM Performed by: Romie Minus K Pre-anesthesia Checklist: Patient identified, Emergency Drugs available, Suction available, Patient being monitored and Timeout performed Patient Re-evaluated:Patient Re-evaluated prior to inductionOxygen Delivery Method: Circle system utilized Preoxygenation: Pre-oxygenation with 100% oxygen Intubation Type: IV induction Ventilation: Mask ventilation without difficulty and Oral airway inserted - appropriate to patient size LMA: LMA with gastric port inserted LMA Size: 5.0 Number of attempts: 1 Placement Confirmation: positive ETCO2,  CO2 detector and breath sounds checked- equal and bilateral Tube secured with: Tape Dental Injury: Teeth and Oropharynx as per pre-operative assessment

## 2012-07-08 NOTE — H&P (View-Only) (Signed)
Primary Care Physician: Tally Due, MD Referring Physician:  Dr Worthy Rancher is a 79 y.o. male with a h/o atrial flutter who presents for EP consultation.  He presented in April of 2014 with dizziness and was noted to be in atrial flutter. He underwent cardiac catheterization on 05/06/12 which revealed normal coronary arteries. 2-D echo 4/14 showed an ejection fraction of 55-60% with mild hypokinesis of the basal septum. He was started on beta blocker and Xarelto for the atrial flutter. He has been managed by Dr Jens Som since that time.  He has fatigue with atrial flutter. Today, he denies symptoms of palpitations, chest pain, shortness of breath, orthopnea, PND, lower extremity edema, dizziness, presyncope, syncope, or neurologic sequela. The patient is tolerating medications without difficulties and is otherwise without complaint today.   Past Medical History  Diagnosis Date  . Hypertension   . Colon polyps   . Diverticulosis of colon (without mention of hemorrhage)   . Internal and external hemorrhoids without complication   . Gastritis, chronic     Pt denies bleeding 04/2012. EGD 2012 reportedly normal.  . COPD (chronic obstructive pulmonary disease)   . IBS (irritable bowel syndrome)   . 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 Atrial flutter     a. Slow ~100bpm when in 2:1; dx 04/2012. b. Placed on Xarelto.  . Hyponatremia     04/2012 r/t diuretic.  [name] Acute renal insufficiency     04/2012 - resolved.  . Abnormal EKG     ST changes with normal coronaries by cath 05/06/12.  . Carotid artery calcification     By CXR - dopplers 05/06/12 without obvious evidence of significant ICA stenosis >40%   Past Surgical History  Procedure Laterality Date  . Oropharyngeal resection      For tongue cancer  . Hand surgery    . Hemorrhoid surgery    . Colonoscopy  08/29/10    diverticulosis, internal hemorrhoids  . Esophagogastroduodenoscopy  09/20/10    normal    Current Outpatient  Prescriptions  Medication Sig Dispense Refill  . Calcium Carbonate-Vit D-Min (CALCIUM 1200 PO) Take 1 tablet by mouth daily.       . Cholecalciferol (RA VITAMIN D-3) 2000 UNITS CAPS Take 2,000 Units by mouth daily.       . Multiple Vitamins-Minerals (OCUVITE PO) Take 1 capsule by mouth 2 (two) times daily.       [name] omeprazole (PRILOSEC) 20 MG capsule Take 1 capsule (20 mg total) by mouth daily.  30 capsule  3  . Rivaroxaban (XARELTO) 20 MG TABS Take 20 mg by mouth daily.      . metoprolol tartrate (LOPRESSOR) 25 MG tablet Take 25-50 mg by mouth 2 (two) times daily. Take 50mg  in the morning and 25mg  in the evening       No current facility-administered medications for this visit.    Allergies  Allergen Reactions  . Dyazide (Hydrochlorothiazide W-Triamterene)     Caused hyponatremia 04/2012    History   Social History  . Marital Status: Divorced    Spouse Name: N/A    Number of Children: N/A  . Years of Education: N/A   Occupational History  . Retired    Social History Main Topics  . Smoking status: Former Smoker    Quit date: 01/09/1996  . Smokeless tobacco: Never Used     Comment: 10 years   . Alcohol Use: Yes     Comment: 2-3 12 oz beers a  day  . Drug Use: No  . Sexually Active: Not on file   Other Topics Concern  . Not on file   Social History Narrative   Lives in [place] by himself    Family History  Problem Relation Age of Onset  . Prostate cancer Brother   . Diabetes    . Heart disease Brother     details unclear    ROS- All systems are reviewed and negative except as per the HPI above  Physical Exam: Filed Vitals:   06/19/12 1458  BP: 130/70  Pulse: 102  Height: 6\' 1"  (1.854 m)  Weight: 164 lb (74.39 kg)    GEN- The patient is well appearing, alert and oriented x 3 today.   Head- normocephalic, atraumatic Eyes-  Sclera clear, conjunctiva pink Ears- hearing intact Oropharynx- clear Neck- supple, no JVP Lymph- no cervical lymphadenopathy Lungs-  Clear to ausculation bilaterally, normal work of breathing Heart- Regular rate and rhythm, no murmurs, rubs or gallops, PMI not laterally displaced GI- soft, NT, ND, + BS Extremities- no clubbing, cyanosis, or edema MS- no significant deformity or atrophy Skin- no rash or lesion Psych- euthymic mood, full affect Neuro- strength and sensation are intact  EKG today reveals atrial flutter with 2:1 AV conduction, V rate 102 bpm, nonspecific ST/T changes  Assessment and Plan:  1. Symptomatic typical appearing atrial flutter  Therapeutic strategies for atrial flutter including medicine and ablation were discussed in detail with the patient today. Risk, benefits, and alternatives to EP study and radiofrequency ablation were also discussed in detail today. These risks include but are not limited to stroke, bleeding, vascular damage, tamponade, perforation, damage to the heart and other structures, AV block requiring pacemaker, worsening renal function, and death. The patient understands these risk and wishes to proceed.  We will therefore proceed with catheter ablation at the next available time.  2. HTN Stable No change required today

## 2012-07-08 NOTE — Anesthesia Postprocedure Evaluation (Signed)
  Anesthesia Post-op Note  Patient: Chad [name]  Procedure(s) Performed: Procedure(s): ATRIAL FLUTTER ABLATION (N/A)  Patient Location: 4N  Anesthesia Type:General  Level of Consciousness: awake, alert  and oriented  Airway and Oxygen Therapy: Patient Spontanous Breathing  Post-op Pain: none  Post-op Assessment: Post-op Vital signs reviewed, Patient's Cardiovascular Status Stable, Respiratory Function Stable, Patent Airway and Pain level controlled  Post-op Vital Signs: stable  Complications: No apparent anesthesia complications

## 2012-07-08 NOTE — Preoperative (Signed)
Beta Blockers   Reason not to administer Beta Blockers:Not Applicable 

## 2012-07-08 NOTE — Interval H&P Note (Signed)
History and Physical Interval Note:  07/08/2012 11:14 AM  Chad [name]  has presented today for surgery, with the diagnosis of Fib  The various methods of treatment have been discussed with the patient and family. After consideration of risks, benefits and other options for treatment, the patient has consented to  Procedure(s): ATRIAL FLUTTER ABLATION (N/A) as a surgical intervention .  The patient's history has been reviewed, patient examined, no change in status, stable for surgery.  I have reviewed the patient's chart and labs.  Questions were answered to the patient's satisfaction.     Hillis Range

## 2012-07-08 NOTE — Anesthesia Preprocedure Evaluation (Addendum)
Anesthesia Evaluation  Patient identified by MRN, date of birth, ID band Patient awake    Reviewed: Allergy & Precautions, H&P , NPO status , Patient's Chart, lab work & pertinent test results, reviewed documented beta blocker date and time   History of Anesthesia Complications Negative for: history of anesthetic complications  Airway Mallampati: II      Dental  (+) Edentulous Upper   Pulmonary COPDformer smoker,  breath sounds clear to auscultation        Cardiovascular hypertension, Pt. on medications and Pt. on home beta blockers + dysrhythmias Atrial Fibrillation Rhythm:Regular Rate:Normal     Neuro/Psych negative neurological ROS     GI/Hepatic Neg liver ROS, GERD-  ,  Endo/Other  negative endocrine ROS  Renal/GU Renal disease     Musculoskeletal   Abdominal   Peds  Hematology negative hematology ROS (+)   Anesthesia Other Findings   Reproductive/Obstetrics negative OB ROS                          Anesthesia Physical Anesthesia Plan  ASA: III  Anesthesia Plan: General   Post-op Pain Management:    Induction: Intravenous  Airway Management Planned: LMA  Additional Equipment:   Intra-op Plan:   Post-operative Plan:   Informed Consent: I have reviewed the patients History and Physical, chart, labs and discussed the procedure including the risks, benefits and alternatives for the proposed anesthesia with the patient or authorized representative who has indicated his/her understanding and acceptance.     Plan Discussed with: CRNA and Anesthesiologist  Anesthesia Plan Comments: (Persistent stable Aflutter, normal LV systolic function nl coronaries by cath 4/14 Htn H/O Ca tongue, S/P neck dissection 1998  Plan GA with LMa  Kipp Brood, MD)        Anesthesia Quick Evaluation

## 2012-07-08 NOTE — Transfer of Care (Signed)
Immediate Anesthesia Transfer of Care Note  Patient: Chad [name]  Procedure(s) Performed: Procedure(s): ATRIAL FLUTTER ABLATION (N/A)  Patient Location: Cath Lab  Anesthesia Type:General  Level of Consciousness: awake, alert , oriented and patient cooperative  Airway & Oxygen Therapy: Patient Spontanous Breathing and Patient connected to nasal cannula oxygen   Post-op Assessment: Report given to PACU RN and Post -op Vital signs reviewed and stable  Post vital signs: Reviewed and stable  Complications: No apparent anesthesia complications

## 2012-07-08 NOTE — Progress Notes (Signed)
Doing well s/p atrial flutter ablation Maintaining sinus rhythm, though with a long first degree AV block.  Stop metoprolol Continue xarelto for 4 weeks Follow-up with me in 4 weeks.

## 2012-07-08 NOTE — Op Note (Signed)
PREPROCEDURE DIAGNOSIS: Typical-appearing atrial flutter.   POSTPROCEDURE DIAGNOSIS: Isthmus-dependent right atrial flutter.   PROCEDURES:  1. Comprehensive EP study.  2. Coronary sinus pacing and recording.  3. Mapping of SVT.  4. Ablation of SVT.   INTRODUCTION:  Chad [name] is a 79 y.o. male with a history of symptomatic typical-appearing atrial flutter. He presents today for EP study and radiofrequency ablation.  He has been adequately anticoagulated with xarelto.  He reports compliance with this medicine without interruption.   DESCRIPTION OF THE PROCEDURE: Informed written consent was obtained, and the patient was brought to the electrophysiology lab in the fasting state. The patient was then adequately sedated with anesthesia as outlined in the anesthesia report. The patient's right groin was prepped and draped in the usual sterile fashion by the EP lab staff. Using a percutaneous Seldinger technique a 6, 6, and 8-French  hemostasis sheaths were placed into the right common femoral vein. A 6- [employer] X decapolar coronary sinus catheter was introduced through the right common femoral vein and advanced into the coronary sinus for recording and pacing from this location. A 6-French quadripolar Josephson catheter was introduced through the right common femoral vein and advanced into the right ventricle for recording and pacing. This catheter was then pulled back to the His bundle location. The patient presented to the electrophysiology lab in atrial flutter. The surface electrocardiogram was consistent with typical atrial flutter. The coronary sinus activation sequence was proximal to distal and suggestive of right atrial flutter.  The atrial flutter cycle length was 315 msec. Atrial entrainment mapping was then performed. When pacing from the left atrium, a long post pacing interval was observed. With entrainment mapping from the cavotricuspid isthmus, the post pacing  interval was equal to the tachycardia cycle length and therefore suggestive of isthmus-dependent right atrial flutter. The patient's QRS duration measured 98 msec with an RR interval of 624 msec and an HV interval of 48 msec. I elected to perform cavotricuspid isthmus ablation.   A [employer] 7-French  10mm ablation catheter was introduced through the right common femoral vein and advanced into the right atrium. Mapping of the cavotricuspid isthmus was performed which revealed a standard isthmus. A series of two radiofrequency applications were delivered along the cavotricuspid isthmus with a target temperature of 60 degrees with power of 70 watts. During the first radiofrequency ablation lesion, the tachycardia slowed and then terminated. The patient remained in sinus rhythm thereafter. An additional 1 radiofrequency applications were then delivered with similar parameters in order to obtain complete bidirectional cavotricuspid isthmus block.   Following ablation, differential atrial pacing was performed from the low lateral right atrium. This confirmed complete bidirectional cavotricuspid isthmus  block with a stimulus to earliest atrial activation recorded bidirectional across the isthmus measuring 230 msec. The patient was observed for 20 minutes without return of conduction through the isthmus.  Following ablation, the AH interval measured 204 msec with an HV interval of 57 msec. Rapid atrial pacing was performed, which revealed an AV Wenckebach cycle length of 670 msec with no evidence of PR greater than RR and no sustained tachycardias induced when pacing down to a cycle length of 200 msec.  There was no evidence of infrahisian block during the EP study today. Ventricular pacing was then performed which revealed VA dissociation when pacing at a cycle length of 600 msec.  The procedure was therefore considered completed. All catheters were removed, and the sheaths were aspirated and flushed. The  sheaths were removed and hemostasis was assured. There were no early apparent complications.   CONCLUSIONS:  1. Isthmus-dependent right atrial flutter upon presentation, successfully ablated along the usual cavotricuspid isthmus.  2. Complete bidirectional cavotricuspid isthmus block achieved.  3. No inducible arrhythmias following ablation.  4. No early apparent complications.  Fayrene Fearing Dory Demont,MD 07/08/2012 1:07 PM

## 2012-07-10 ENCOUNTER — Telehealth: Payer: Self-pay | Admitting: *Deleted

## 2012-07-10 MED ORDER — RIVAROXABAN 20 MG PO TABS: 20.0000 mg | ORAL_TABLET | Freq: Every day | ORAL | Status: DC

## 2012-07-10 NOTE — Telephone Encounter (Signed)
Pt walked in with medication questions.  States he was told he was on something for reflux but he doesn't have a RX.  In reviewing his med list he is on omeprazole 20 mg a day.  Explained this to pt who stated he thought that was for fungus.  Pt also states he only has about6 tablets of Xarelto left and that he has been getting samples.  No samples in closet.  Pt was given a card to activate with instructions for a 10 day supply.  RX will be sent into his pharmacy per his request.

## 2012-07-14 ENCOUNTER — Emergency Department (HOSPITAL_COMMUNITY)
Admission: EM | Admit: 2012-07-14 | Discharge: 2012-07-14 | Disposition: A | Discharge status: Home / Self Care | Payer: Medicare Other | Attending: Emergency Medicine | Admitting: Emergency Medicine

## 2012-07-14 ENCOUNTER — Encounter (HOSPITAL_COMMUNITY): Payer: Self-pay | Admitting: *Deleted

## 2012-07-14 DIAGNOSIS — I4892 Unspecified atrial flutter: Secondary | ICD-10-CM | POA: Diagnosis not present

## 2012-07-14 DIAGNOSIS — M129 Arthropathy, unspecified: Secondary | ICD-10-CM | POA: Insufficient documentation

## 2012-07-14 DIAGNOSIS — Z87891 Personal history of nicotine dependence: Secondary | ICD-10-CM | POA: Diagnosis not present

## 2012-07-14 DIAGNOSIS — Z85810 Personal history of malignant neoplasm of tongue: Secondary | ICD-10-CM | POA: Diagnosis not present

## 2012-07-14 DIAGNOSIS — Z7901 Long term (current) use of anticoagulants: Secondary | ICD-10-CM | POA: Diagnosis not present

## 2012-07-14 DIAGNOSIS — Z888 Allergy status to other drugs, medicaments and biological substances status: Secondary | ICD-10-CM | POA: Insufficient documentation

## 2012-07-14 DIAGNOSIS — J449 Chronic obstructive pulmonary disease, unspecified: Secondary | ICD-10-CM | POA: Diagnosis not present

## 2012-07-14 DIAGNOSIS — K219 Gastro-esophageal reflux disease without esophagitis: Secondary | ICD-10-CM | POA: Diagnosis not present

## 2012-07-14 DIAGNOSIS — R209 Unspecified disturbances of skin sensation: Secondary | ICD-10-CM | POA: Diagnosis not present

## 2012-07-14 DIAGNOSIS — Z79899 Other long term (current) drug therapy: Secondary | ICD-10-CM | POA: Insufficient documentation

## 2012-07-14 DIAGNOSIS — J4489 Other specified chronic obstructive pulmonary disease: Secondary | ICD-10-CM | POA: Insufficient documentation

## 2012-07-14 DIAGNOSIS — IMO0001 Reserved for inherently not codable concepts without codable children: Secondary | ICD-10-CM

## 2012-07-14 DIAGNOSIS — N289 Disorder of kidney and ureter, unspecified: Secondary | ICD-10-CM | POA: Diagnosis not present

## 2012-07-14 DIAGNOSIS — Z8719 Personal history of other diseases of the digestive system: Secondary | ICD-10-CM | POA: Insufficient documentation

## 2012-07-14 DIAGNOSIS — I129 Hypertensive chronic kidney disease with stage 1 through stage 4 chronic kidney disease, or unspecified chronic kidney disease: Secondary | ICD-10-CM | POA: Insufficient documentation

## 2012-07-14 DIAGNOSIS — I498 Other specified cardiac arrhythmias: Secondary | ICD-10-CM | POA: Diagnosis not present

## 2012-07-14 LAB — BASIC METABOLIC PANEL
CO2: 28 mEq/L (ref 19–32)
Chloride: 101 mEq/L (ref 96–112)
Creatinine, Ser: 1.12 mg/dL (ref 0.50–1.35)
Glucose, Bld: 103 mg/dL — ABNORMAL HIGH (ref 70–99)
Sodium: 138 mEq/L (ref 135–145)

## 2012-07-14 LAB — BASIC METABOLIC PANEL WITH GFR
BUN: 13 mg/dL (ref 6–23)
Calcium: 9.8 mg/dL (ref 8.4–10.5)
GFR calc Af Amer: 70 mL/min — ABNORMAL LOW (ref >90)
GFR calc non Af Amer: 61 mL/min — ABNORMAL LOW (ref >90)
Potassium: 4.7 meq/L (ref 3.5–5.1)

## 2012-07-14 NOTE — ED Notes (Signed)
EDP at bedside  

## 2012-07-14 NOTE — ED Provider Notes (Signed)
History    CSN: [id number] Arrival date & time 07/14/12  1306  First MD Initiated Contact with Patient 07/14/12 1553     Chief Complaint  Patient presents with  . Leg Pain   (Consider location/radiation/quality/duration/timing/severity/associated sxs/prior Treatment) HPI Comments: Pt with a few day history of bilateral shooting pains from soles of feet up through legs, intermittnet numbness, burning sensation.  Pt has not had in the past. Can occur at any time, not necessarily with exertion.  Pt had ablation procedure by Dr. Johney Frame last week through right groin, no fever, chills, no redness, bleeding.  Sensation is in both legs.  No h/o DM.  No weakness in legs.  No CP, SOB, palpitations. Pt's metoprolol was stopped.    Patient is a 79 y.o. male presenting with leg pain. The history is provided by the patient.  Leg Pain Associated symptoms: no back pain and no fever    Past Medical History  Diagnosis Date  . Hypertension   . Colon polyps   . Diverticulosis of colon (without mention of hemorrhage)   . Internal and external hemorrhoids without complication   . Gastritis, chronic     Pt denies bleeding 04/2012. EGD 2012 reportedly normal.  . COPD (chronic obstructive pulmonary disease)   . IBS (irritable bowel syndrome)   . 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 Atrial flutter     a. Slow ~100bpm when in 2:1; dx 04/2012. b. Placed on Xarelto.  c. s/p ablation 07-08-2012 by Dr Johney Frame  . Hyponatremia     04/2012 r/t diuretic.  [name] Acute renal insufficiency     04/2012 - resolved.  . Abnormal EKG     ST changes with normal coronaries by cath 05/06/12.  . Carotid artery calcification     By CXR - dopplers 05/06/12 without obvious evidence of significant ICA stenosis >40%  . GERD (gastroesophageal reflux disease)   . Arthritis     HANDS   Past Surgical History  Procedure Laterality Date  . Oropharyngeal resection      For tongue cancer  . Hand surgery    . Hemorrhoid surgery    . Colonoscopy  08/29/10     diverticulosis, internal hemorrhoids  . Esophagogastroduodenoscopy  09/20/10    normal  . Ablation  07-08-2012    of typical atrial flutter by Dr Johney Frame   Family History  Problem Relation Age of Onset  . Prostate cancer Brother   . Diabetes    . Heart disease Brother     details unclear   History  Substance Use Topics  . Smoking status: Former Smoker    Quit date: 01/09/1996  . Smokeless tobacco: Never Used     Comment: 10 years   . Alcohol Use: Yes     Comment: 2-3 12 oz beers a day    Review of Systems  Constitutional: Negative for fever and chills.  Respiratory: Negative for shortness of breath.   Cardiovascular: Negative for chest pain.  Gastrointestinal: Negative for nausea, vomiting and abdominal pain.  Genitourinary: Negative for decreased urine volume and difficulty urinating.  Musculoskeletal: Negative for back pain and arthralgias.  Skin: Negative for color change, pallor, rash and wound.  Neurological: Positive for numbness. Negative for weakness.    Allergies  Dyazide  Home Medications   Current Outpatient Rx  Name  Route  Sig  Dispense  Refill  . Calcium Carbonate-Vit D-Min (CALCIUM 1200 PO)   Oral   Take 1 tablet by mouth daily.          [name]  Cholecalciferol (RA VITAMIN D-3) 2000 UNITS CAPS   Oral   Take 2,000 Units by mouth daily.          . Multiple Vitamins-Minerals (OCUVITE PO)   Oral   Take 1 capsule by mouth 2 (two) times daily.          [name] omeprazole (PRILOSEC) 20 MG capsule   Oral   Take 1 capsule (20 mg total) by mouth daily.   30 capsule   3   . Rivaroxaban (XARELTO) 20 MG TABS   Oral   Take 1 tablet (20 mg total) by mouth daily.   30 tablet   6    BP 181/81  Pulse 75  Temp(Src) 97.8 F (36.6 C) (Oral)  Resp 16  SpO2 98% Physical Exam  Nursing note and vitals reviewed. Constitutional: He appears well-developed and well-nourished.  HENT:  Head: Normocephalic and atraumatic.  Eyes: Conjunctivae are normal. No  scleral icterus.  Cardiovascular: Normal rate and intact distal pulses.   No murmur heard. Pulmonary/Chest: Effort normal. No respiratory distress. He has no rales.  Abdominal: Soft.  Musculoskeletal: He exhibits no edema and no tenderness.  Neurological: He is alert. He exhibits normal muscle tone. Coordination normal.  Reflex Scores:      Patellar reflexes are 2+ on the right side and 2+ on the left side. Skin: Skin is warm, dry and intact. No bruising, no purpura and no rash noted. He is not diaphoretic. No erythema. No pallor.  Psychiatric: He has a normal mood and affect.    ED Course  Procedures (including critical care time) Labs Reviewed  BASIC METABOLIC PANEL - Abnormal; Notable for the following:    Glucose, Bld 103 (*)    GFR calc non Af Amer 61 (*)    GFR calc Af Amer 70 (*)    All other components within normal limits   No results found. 1. Paresthesias/numbness     RA sat is 96% and I interpret to be normal  ECG at time 13:12 shows sinus tachycardia at rate 105, poor r wave progression, non specific ST and T wave abn's, lateral leads.  Axis is normal.     ECG at time 18:26 shows sr with first degree AV block, poor R wave progression anterior leads, LAD and probably LAFB.    6:33 PM Spoke to PA at [facility] [address], will try to get pt seen in clinic shortly. Pt told to return for difficulty with urination, increasing numbness to pelvis or perineum or any worsening low back pain or pain in legs.    MDM  Pt with intermittent parestehsias, but no evidence of vascular compromise,   Gavin Pound. Oletta Lamas, MD 07/14/12 4782

## 2012-07-14 NOTE — Discharge Instructions (Signed)
 Paresthesia Paresthesia is an abnormal burning or prickling sensation. This sensation is generally felt in the hands, arms, legs, or feet. However, it may occur in any part of the body. It is usually not painful. The feeling may be described as:  Tingling or numbness.  Pins and needles.  Skin crawling.  Buzzing.  Limbs falling asleep.  Itching. Most people experience temporary (transient) paresthesia at some time in their lives. CAUSES  Paresthesia may occur when you breathe too quickly (hyperventilation). It can also occur without any apparent cause. Commonly, paresthesia occurs when pressure is placed on a nerve. The feeling quickly goes away once the pressure is removed. For some people, however, paresthesia is a long-lasting (chronic) condition caused by an underlying disorder. The underlying disorder may be:  A traumatic, direct injury to nerves. Examples include a:  Broken (fractured) neck.  Fractured skull.  A disorder affecting the brain and spinal cord (central nervous system). Examples include:  Transverse myelitis.  Encephalitis.  Transient ischemic attack.  Multiple sclerosis.  Stroke.  Tumor or blood vessel problems, such as an arteriovenous malformation pressing against the brain or spinal cord.  A condition that damages the peripheral nerves (peripheral neuropathy). Peripheral nerves are not part of the brain and spinal cord. These conditions include:  Diabetes.  Peripheral vascular disease.  Nerve entrapment syndromes, such as carpal tunnel syndrome.  Shingles.  Hypothyroidism.  Vitamin B12 deficiencies.  Alcoholism.  Heavy metal poisoning (lead, arsenic).  Rheumatoid arthritis.  Systemic lupus erythematosus. DIAGNOSIS  Your caregiver will attempt to find the underlying cause of your paresthesia. Your caregiver may:  Take your medical history.  Perform a physical exam.  Order various lab tests.  Order imaging tests. TREATMENT    Treatment for paresthesia depends on the underlying cause. HOME CARE INSTRUCTIONS  Avoid drinking alcohol.  You may consider massage or acupuncture to help relieve your symptoms.  Keep all follow-up appointments as directed by your caregiver. SEEK IMMEDIATE MEDICAL CARE IF:   You feel weak.  You have trouble walking or moving.  You have problems with speech or vision.  You feel confused.  You cannot control your bladder or bowel movements.  You feel numbness after an injury.  You faint.  Your burning or prickling feeling gets worse when walking.  You have pain, cramps, or dizziness.  You develop a rash. MAKE SURE YOU:  Understand these instructions.  Will watch your condition.  Will get help right away if you are not doing well or get worse. Document Released: 12/15/2001 Document Revised: 03/19/2011 Document Reviewed: 09/15/2010 [facility] Patient Information 2014 [place], [place].     Watch for increasing weakness, numbness around your pelvis, worse pain in low back, fevers, or difficulty making urine or having bowel movements.

## 2012-07-14 NOTE — ED Notes (Signed)
Pt is here with pain to back of both legs and states LE feel warm and has numbness to feet at times.  Pt has had a recent ablation for atrial flutter and HR is in 130s at triage.  Pt denies sob or chest discomfort.

## 2012-07-17 ENCOUNTER — Ambulatory Visit (INDEPENDENT_AMBULATORY_CARE_PROVIDER_SITE_OTHER): Payer: Medicare Other | Admitting: Internal Medicine

## 2012-07-17 VITALS — BP 120/74 | HR 80 | Temp 97.6°F | Resp 17 | Ht 74.0 in | Wt 157.0 lb

## 2012-07-17 DIAGNOSIS — I4892 Unspecified atrial flutter: Secondary | ICD-10-CM

## 2012-07-17 DIAGNOSIS — R209 Unspecified disturbances of skin sensation: Secondary | ICD-10-CM | POA: Diagnosis not present

## 2012-07-17 DIAGNOSIS — D649 Anemia, unspecified: Secondary | ICD-10-CM | POA: Diagnosis not present

## 2012-07-17 DIAGNOSIS — R195 Other fecal abnormalities: Secondary | ICD-10-CM | POA: Diagnosis not present

## 2012-07-17 DIAGNOSIS — K921 Melena: Secondary | ICD-10-CM

## 2012-07-17 DIAGNOSIS — R202 Paresthesia of skin: Secondary | ICD-10-CM

## 2012-07-17 LAB — POCT CBC
HCT, POC: 39.1 % — AB (ref 43.5–53.7)
Lymph, poc: 1.5 (ref 0.6–3.4)
MCHC: 30.7 g/dL — AB (ref 31.8–35.4)
MCV: 96.6 fL (ref 80–97)
POC LYMPH PERCENT: 20.4 %L (ref 10–50)
RDW, POC: 12.7 %

## 2012-07-17 NOTE — Progress Notes (Signed)
  Subjective:    Patient ID: Chad [name], male    DOB: 1931/09/08, 79 y.o.   MRN: [id number]  HPI Has had recent cardiac ablation and is on xarelto, has seen black stools , colonoscopy 2012 on chart review. Was anemic mid to low 30shct on last 5 cbcs. The er was worried about paresthesias in legs and feet. He feels like he is walking on gravel but he has normal sensation and normal strength. He does have 1+ edema. Atrial flutter has resolved with ablation. EKGs reviewed.   Review of Systems     Objective:   Physical Exam  Constitutional: He is oriented to person, place, and time. He appears well-developed and well-nourished. No distress.  Cardiovascular: Normal rate, regular rhythm and intact distal pulses.   Murmur heard. Pulmonary/Chest: Effort normal and breath sounds normal. He has no wheezes. He has no rales.  Musculoskeletal: Normal range of motion. He exhibits edema.  Neurological: He is alert and oriented to person, place, and time. He has normal reflexes. No cranial nerve deficit. He exhibits normal muscle tone. Coordination normal.  Skin: No rash noted.  Psychiatric: He has a normal mood and affect. His behavior is normal.   Results for orders placed in visit on 07/17/12  POCT CBC      Result Value Range   WBC 7.2  4.6 - 10.2 K/uL   Lymph, poc 1.5  0.6 - 3.4   POC LYMPH PERCENT 20.4  10 - 50 %L   MID (cbc) 0.5  0 - 0.9   POC MID % 6.8  0 - 12 %M   POC Granulocyte 5.2  2 - 6.9   Granulocyte percent 72.8  37 - 80 %G   RBC 4.05 (*) 4.69 - 6.13 M/uL   Hemoglobin 12.0 (*) 14.1 - 18.1 g/dL   HCT, POC 40.9 (*) 81.1 - 53.7 %   MCV 96.6  80 - 97 fL   MCH, POC 29.6  27 - 31.2 pg   MCHC 30.7 (*) 31.8 - 35.4 g/dL   RDW, POC 91.4     Platelet Count, POC 200  142 - 424 K/uL   MPV 9.8  0 - 99.8 fL  IFOBT (OCCULT BLOOD)      Result Value Range   IFOBT Positive            Assessment & Plan:  Paresthesias feet/Post cardiac ablation/On Xarelto Heme positive stools/UTD  on colonoscopy 6 mo f/up

## 2012-08-11 ENCOUNTER — Ambulatory Visit (INDEPENDENT_AMBULATORY_CARE_PROVIDER_SITE_OTHER): Payer: Medicare Other | Admitting: Internal Medicine

## 2012-08-11 ENCOUNTER — Encounter: Payer: Self-pay | Admitting: Internal Medicine

## 2012-08-11 VITALS — BP 149/78 | HR 82 | Ht 74.0 in | Wt 156.8 lb

## 2012-08-11 DIAGNOSIS — I10 Essential (primary) hypertension: Secondary | ICD-10-CM | POA: Diagnosis not present

## 2012-08-11 DIAGNOSIS — I359 Nonrheumatic aortic valve disorder, unspecified: Secondary | ICD-10-CM | POA: Diagnosis not present

## 2012-08-11 DIAGNOSIS — I4892 Unspecified atrial flutter: Secondary | ICD-10-CM

## 2012-08-11 DIAGNOSIS — I358 Other nonrheumatic aortic valve disorders: Secondary | ICD-10-CM

## 2012-08-11 NOTE — Progress Notes (Signed)
PCP:  Tally Due, MD Primary Cardiologist: Olga Millers, MD  The patient presents today for routine electrophysiology followup.  Since his flutter ablation, he has done well.  He has had some black stools and has been seen by Dr Perrin Maltese.  This has resolved.  Today, he denies symptoms of palpitations, chest pain, shortness of breath, orthopnea, PND, lower extremity edema, dizziness, presyncope, syncope, or neurologic sequela.  The patient feels that he is tolerating medications without difficulties and is otherwise without complaint today.   Past Medical History  Diagnosis Date  . Hypertension   . Colon polyps   . Diverticulosis of colon (without mention of hemorrhage)   . Internal and external hemorrhoids without complication   . Gastritis, chronic     Pt denies bleeding 04/2012. EGD 2012 reportedly normal.  . COPD (chronic obstructive pulmonary disease)   . IBS (irritable bowel syndrome)   . 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 Atrial flutter     a. Slow ~100bpm when in 2:1; dx 04/2012. b. Placed on Xarelto.  c. s/p ablation 07-08-2012 by Dr Johney Frame  . Hyponatremia     04/2012 r/t diuretic.  [name] Acute renal insufficiency     04/2012 - resolved.  . Abnormal EKG     ST changes with normal coronaries by cath 05/06/12.  . Carotid artery calcification     By CXR - dopplers 05/06/12 without obvious evidence of significant ICA stenosis >40%  . GERD (gastroesophageal reflux disease)   . Arthritis     HANDS   Past Surgical History  Procedure Laterality Date  . Oropharyngeal resection      For tongue cancer  . Hand surgery    . Hemorrhoid surgery    . Colonoscopy  08/29/10    diverticulosis, internal hemorrhoids  . Esophagogastroduodenoscopy  09/20/10    normal  . Ablation  07-08-2012    of typical atrial flutter by Dr Johney Frame    Current Outpatient Prescriptions  Medication Sig Dispense Refill  . Calcium Carbonate-Vit D-Min (CALCIUM 1200 PO) Take 1 tablet by mouth daily.       .  Cholecalciferol (RA VITAMIN D-3) 2000 UNITS CAPS Take 2,000 Units by mouth daily.       . Multiple Vitamins-Minerals (CENTRUM SILVER ADULT 50+ PO) Take 50 mg by mouth daily.      . Multiple Vitamins-Minerals (OCUVITE PO) Take 1 capsule by mouth 2 (two) times daily.       [name] omeprazole (PRILOSEC) 20 MG capsule Take 1 capsule (20 mg total) by mouth daily.  30 capsule  3  . Rivaroxaban (XARELTO) 20 MG TABS Take 1 tablet (20 mg total) by mouth daily.  30 tablet  6   No current facility-administered medications for this visit.    Allergies  Allergen Reactions  . Dyazide (Hydrochlorothiazide W-Triamterene)     Caused hyponatremia 04/2012    History   Social History  . Marital Status: Divorced    Spouse Name: N/A    Number of Children: N/A  . Years of Education: N/A   Occupational History  . Retired    Social History Main Topics  . Smoking status: Former Smoker    Quit date: 01/09/1996  . Smokeless tobacco: Never Used     Comment: 10 years   . Alcohol Use: Yes     Comment: 2-3 12 oz beers a day  . Drug Use: No  . Sexually Active: Not on file   Other Topics Concern  . Not  on file   Social History Narrative   Lives in [place] by himself    Family History  Problem Relation Age of Onset  . Prostate cancer Brother   . Diabetes    . Heart disease Brother     details unclear    ROS-  All systems are reviewed and are negative except as outlined in the HPI above  Physical Exam: Filed Vitals:   08/11/12 1046  BP: 149/78  Pulse: 82  Height: 6\' 2"  (1.88 m)  Weight: 156 lb 12.8 oz (71.124 kg)    GEN- The patient is well appearing, alert and oriented x 3 today.   Head- normocephalic, atraumatic Eyes-  Sclera clear, conjunctiva pink Ears- hearing intact Oropharynx- clear Neck- supple, no JVP Lymph- no cervical lymphadenopathy Lungs- Clear to ausculation bilaterally, normal work of breathing Heart- Regular rate and rhythm, no murmurs, rubs or gallops, PMI not laterally  displaced GI- soft, NT, ND, + BS Extremities- no clubbing, cyanosis, or edema MS- no significant deformity or atrophy Skin- no rash or lesion Psych- euthymic mood, full affect Neuro- strength and sensation are intact  ekg today reveals sinus rhythm 83 bpm, PR 288, QTc 460, otherwise normal ekg  Assessment and Plan:  1. Atrial flutter Doing well s/p ablation Stop xarelto  2. HTN Stable No change required today  Follow-up with Dr Jens Som I will see as needed going forward

## 2012-08-11 NOTE — Patient Instructions (Addendum)
Your physician recommends that you schedule a follow-up appointment as needed   Your physician has recommended you make the following change in your medication:  1) Stop Xarelto   

## 2012-08-25 ENCOUNTER — Encounter: Payer: Self-pay | Admitting: Cardiology

## 2012-08-25 ENCOUNTER — Ambulatory Visit (INDEPENDENT_AMBULATORY_CARE_PROVIDER_SITE_OTHER): Payer: Medicare Other | Admitting: Cardiology

## 2012-08-25 VITALS — BP 120/64 | HR 80 | Ht 74.0 in | Wt 161.8 lb

## 2012-08-25 DIAGNOSIS — I10 Essential (primary) hypertension: Secondary | ICD-10-CM

## 2012-08-25 DIAGNOSIS — I4892 Unspecified atrial flutter: Secondary | ICD-10-CM

## 2012-08-25 NOTE — Assessment & Plan Note (Signed)
Status post ablation. Remains in sinus rhythm on examination.

## 2012-08-25 NOTE — Patient Instructions (Addendum)
Your physician wants you to follow-up in: ONE YEAR WITH DR CRENSHAW You will receive a reminder letter in the mail two months in advance. If you don't receive a letter, please call our office to schedule the follow-up appointment.  

## 2012-08-25 NOTE — Progress Notes (Signed)
HPI: Pleasant male for fu of atrial flutter. Patient presented in April of 2014 with dizziness and noted to be in atrial flutter. I did not see him during that hospitalization. There was a question of ST elevation. He underwent cardiac catheterization on 05/06/12 which revealed normal coronary arteries. 2-D echo 4/14 showed an ejection fraction of 55-60% with mild hypokinesis of the basal septum. He was started on beta blocker and Xarelto for the atrial flutter. Carotid Dopplers April 2014 showed no significant stenosis. TSH normal. Patient had atrial flutter ablation on 07/08/2012. Since that time, the patient denies any dyspnea on exertion, orthopnea, PND, pedal edema, palpitations, syncope or chest pain.    Current Outpatient Prescriptions  Medication Sig Dispense Refill  . Calcium Carbonate-Vit D-Min (CALCIUM 1200 PO) Take 1 tablet by mouth daily.       . Cholecalciferol (RA VITAMIN D-3) 2000 UNITS CAPS Take 2,000 Units by mouth daily.       . Multiple Vitamins-Minerals (CENTRUM SILVER ADULT 50+ PO) Take 50 mg by mouth daily.      . Multiple Vitamins-Minerals (OCUVITE PO) Take 1 capsule by mouth 2 (two) times daily.       [name] omeprazole (PRILOSEC) 20 MG capsule Take 1 capsule (20 mg total) by mouth daily.  30 capsule  3   No current facility-administered medications for this visit.     Past Medical History  Diagnosis Date  . Hypertension   . Colon polyps   . Diverticulosis of colon (without mention of hemorrhage)   . Internal and external hemorrhoids without complication   . Gastritis, chronic     Pt denies bleeding 04/2012. EGD 2012 reportedly normal.  . COPD (chronic obstructive pulmonary disease)   . IBS (irritable bowel syndrome)   . 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 Atrial flutter     a. Slow ~100bpm when in 2:1; dx 04/2012. b. Placed on Xarelto.  c. s/p ablation 07-08-2012 by Dr Johney Frame  . Hyponatremia     04/2012 r/t diuretic.  [name] Acute renal insufficiency     04/2012 - resolved.  .  Abnormal EKG     ST changes with normal coronaries by cath 05/06/12.  . Carotid artery calcification     By CXR - dopplers 05/06/12 without obvious evidence of significant ICA stenosis >40%  . GERD (gastroesophageal reflux disease)   . Arthritis     HANDS    Past Surgical History  Procedure Laterality Date  . Oropharyngeal resection      For tongue cancer  . Hand surgery    . Hemorrhoid surgery    . Colonoscopy  08/29/10    diverticulosis, internal hemorrhoids  . Esophagogastroduodenoscopy  09/20/10    normal  . Ablation  07-08-2012    of typical atrial flutter by Dr Johney Frame    History   Social History  . Marital Status: Divorced    Spouse Name: N/A    Number of Children: N/A  . Years of Education: N/A   Occupational History  . Retired    Social History Main Topics  . Smoking status: Former Smoker    Quit date: 01/09/1996  . Smokeless tobacco: Never Used     Comment: 10 years   . Alcohol Use: Yes     Comment: 2-3 12 oz beers a day  . Drug Use: No  . Sexual Activity: Not on file   Other Topics Concern  . Not on file   Social History Narrative   Lives in [place]  by himself    ROS: no fevers or chills, productive cough, hemoptysis, dysphasia, odynophagia, melena, hematochezia, dysuria, hematuria, rash, seizure activity, orthopnea, PND, pedal edema, claudication. Remaining systems are negative.  Physical Exam: Well-developed well-nourished in no acute distress.  Skin is warm and dry.  HEENT is normal.  Neck is supple.  Chest is clear to auscultation with normal expansion.  Cardiovascular exam is regular rate and rhythm.  Abdominal exam nontender or distended. No masses palpated. Extremities show no edema. neuro grossly intact

## 2012-08-25 NOTE — Assessment & Plan Note (Signed)
Blood pressure controlled.continue present medications. 

## 2012-09-25 ENCOUNTER — Other Ambulatory Visit: Payer: Self-pay | Admitting: Radiology

## 2012-09-25 NOTE — Telephone Encounter (Signed)
Chad [name] at 09/24/2012 6:29 PM   Status: Signed            Pt requesting refill for omeprazole 20 mg thru [facility]  Best phone [phone]   Original message was in incorrect chart. This is the correct chart , pended please advise.

## 2012-09-26 MED ORDER — OMEPRAZOLE 20 MG PO CPDR: 20.0000 mg | DELAYED_RELEASE_CAPSULE | Freq: Every day | ORAL | Status: DC

## 2012-09-26 NOTE — Telephone Encounter (Signed)
Sent!

## 2012-09-27 NOTE — Telephone Encounter (Signed)
Notified pt. 

## 2012-10-09 ENCOUNTER — Other Ambulatory Visit: Payer: Self-pay | Admitting: Physician Assistant

## 2013-01-06 DIAGNOSIS — H353190 Nonexudative age-related macular degeneration, unspecified eye, stage unspecified: Secondary | ICD-10-CM | POA: Diagnosis not present

## 2013-01-06 DIAGNOSIS — H2510 Age-related nuclear cataract, unspecified eye: Secondary | ICD-10-CM | POA: Diagnosis not present

## 2013-01-12 ENCOUNTER — Encounter (HOSPITAL_COMMUNITY): Payer: Self-pay | Admitting: Emergency Medicine

## 2013-01-12 ENCOUNTER — Observation Stay (HOSPITAL_COMMUNITY)
Admission: EM | Admit: 2013-01-12 | Discharge: 2013-01-13 | Disposition: A | Discharge status: Home / Self Care | Payer: Medicare Other | Attending: Cardiology | Admitting: Cardiology

## 2013-01-12 ENCOUNTER — Emergency Department (HOSPITAL_COMMUNITY): Payer: Medicare Other

## 2013-01-12 DIAGNOSIS — K219 Gastro-esophageal reflux disease without esophagitis: Secondary | ICD-10-CM | POA: Insufficient documentation

## 2013-01-12 DIAGNOSIS — I471 Supraventricular tachycardia, unspecified: Principal | ICD-10-CM | POA: Insufficient documentation

## 2013-01-12 DIAGNOSIS — I498 Other specified cardiac arrhythmias: Secondary | ICD-10-CM

## 2013-01-12 DIAGNOSIS — I4460 Unspecified fascicular block: Secondary | ICD-10-CM | POA: Insufficient documentation

## 2013-01-12 DIAGNOSIS — J4489 Other specified chronic obstructive pulmonary disease: Secondary | ICD-10-CM | POA: Insufficient documentation

## 2013-01-12 DIAGNOSIS — I4719 Other supraventricular tachycardia: Secondary | ICD-10-CM | POA: Diagnosis present

## 2013-01-12 DIAGNOSIS — R0602 Shortness of breath: Secondary | ICD-10-CM | POA: Insufficient documentation

## 2013-01-12 DIAGNOSIS — R42 Dizziness and giddiness: Secondary | ICD-10-CM

## 2013-01-12 DIAGNOSIS — I10 Essential (primary) hypertension: Secondary | ICD-10-CM | POA: Diagnosis not present

## 2013-01-12 DIAGNOSIS — I4892 Unspecified atrial flutter: Secondary | ICD-10-CM | POA: Diagnosis not present

## 2013-01-12 DIAGNOSIS — Z85819 Personal history of malignant neoplasm of unspecified site of lip, oral cavity, and pharynx: Secondary | ICD-10-CM | POA: Insufficient documentation

## 2013-01-12 DIAGNOSIS — M19049 Primary osteoarthritis, unspecified hand: Secondary | ICD-10-CM | POA: Insufficient documentation

## 2013-01-12 DIAGNOSIS — R197 Diarrhea, unspecified: Secondary | ICD-10-CM | POA: Diagnosis not present

## 2013-01-12 DIAGNOSIS — J449 Chronic obstructive pulmonary disease, unspecified: Secondary | ICD-10-CM | POA: Insufficient documentation

## 2013-01-12 DIAGNOSIS — I4891 Unspecified atrial fibrillation: Secondary | ICD-10-CM

## 2013-01-12 DIAGNOSIS — I495 Sick sinus syndrome: Secondary | ICD-10-CM

## 2013-01-12 DIAGNOSIS — Z87891 Personal history of nicotine dependence: Secondary | ICD-10-CM | POA: Diagnosis not present

## 2013-01-12 DIAGNOSIS — Z79899 Other long term (current) drug therapy: Secondary | ICD-10-CM | POA: Insufficient documentation

## 2013-01-12 DIAGNOSIS — R000 Tachycardia, unspecified: Secondary | ICD-10-CM | POA: Diagnosis not present

## 2013-01-12 HISTORY — DX: Pneumonia, unspecified organism: J18.9

## 2013-01-12 LAB — POCT I-STAT, CHEM 8
BUN: 11 mg/dL (ref 6–23)
BUN: 12 mg/dL (ref 6–23)
Calcium, Ion: 1.16 mmol/L (ref 1.13–1.30)
Calcium, Ion: 1.19 mmol/L (ref 1.13–1.30)
Chloride: 100 meq/L (ref 96–112)
Chloride: 98 mEq/L (ref 96–112)
Creatinine, Ser: 1.1 mg/dL (ref 0.50–1.35)
Creatinine, Ser: 1.3 mg/dL (ref 0.50–1.35)
Glucose, Bld: 103 mg/dL — ABNORMAL HIGH (ref 70–99)
Glucose, Bld: 116 mg/dL — ABNORMAL HIGH (ref 70–99)
HCT: 42 % (ref 39.0–52.0)
HCT: 47 % (ref 39.0–52.0)
Hemoglobin: 14.3 g/dL (ref 13.0–17.0)
Hemoglobin: 16 g/dL (ref 13.0–17.0)
Potassium: 3.9 mEq/L (ref 3.7–5.3)
Potassium: 3.9 meq/L (ref 3.7–5.3)
Sodium: 136 meq/L — ABNORMAL LOW (ref 137–147)
Sodium: 138 meq/L (ref 137–147)
TCO2: 25 mmol/L (ref 0–100)
TCO2: 25 mmol/L (ref 0–100)

## 2013-01-12 LAB — CBC WITH DIFFERENTIAL/PLATELET
BASOS ABS: 0.1 10*3/uL (ref 0.0–0.1)
Basophils Relative: 1 % (ref 0–1)
EOS PCT: 2 % (ref 0–5)
Eosinophils Absolute: 0.2 10*3/uL (ref 0.0–0.7)
HCT: 41.5 % (ref 39.0–52.0)
Hemoglobin: 13.8 g/dL (ref 13.0–17.0)
LYMPHS ABS: 1.4 10*3/uL (ref 0.7–4.0)
LYMPHS PCT: 16 % (ref 12–46)
MCH: 28.9 pg (ref 26.0–34.0)
MCHC: 33.3 g/dL (ref 30.0–36.0)
MCV: 86.8 fL (ref 78.0–100.0)
Monocytes Absolute: 1 10*3/uL (ref 0.1–1.0)
Monocytes Relative: 12 % (ref 3–12)
NEUTROS ABS: 6.1 10*3/uL (ref 1.7–7.7)
NEUTROS PCT: 70 % (ref 43–77)
PLATELETS: 194 10*3/uL (ref 150–400)
RBC: 4.78 MIL/uL (ref 4.22–5.81)
RDW: 14.3 % (ref 11.5–15.5)
WBC: 8.7 10*3/uL (ref 4.0–10.5)

## 2013-01-12 LAB — COMPREHENSIVE METABOLIC PANEL
ALBUMIN: 3.8 g/dL (ref 3.5–5.2)
ALK PHOS: 78 U/L (ref 39–117)
ALT: 13 U/L (ref 0–53)
AST: 32 U/L (ref 0–37)
BUN: 12 mg/dL (ref 6–23)
CHLORIDE: 95 meq/L — AB (ref 96–112)
CO2: 25 mEq/L (ref 19–32)
Calcium: 9.7 mg/dL (ref 8.4–10.5)
Creatinine, Ser: 1.14 mg/dL (ref 0.50–1.35)
GFR calc Af Amer: 69 mL/min — ABNORMAL LOW (ref >90)
GFR calc non Af Amer: 59 mL/min — ABNORMAL LOW (ref >90)
Glucose, Bld: 113 mg/dL — ABNORMAL HIGH (ref 70–99)
POTASSIUM: 4 meq/L (ref 3.7–5.3)
Sodium: 136 mEq/L — ABNORMAL LOW (ref 137–147)
TOTAL PROTEIN: 7.9 g/dL (ref 6.0–8.3)
Total Bilirubin: 0.4 mg/dL (ref 0.3–1.2)

## 2013-01-12 LAB — LIPASE, BLOOD: LIPASE: 50 U/L (ref 11–59)

## 2013-01-12 LAB — POCT I-STAT TROPONIN I
Troponin i, poc: 0.02 ng/mL (ref 0.00–0.08)
Troponin i, poc: 0.02 ng/mL (ref 0.00–0.08)

## 2013-01-12 LAB — MAGNESIUM: Magnesium: 1.9 mg/dL (ref 1.5–2.5)

## 2013-01-12 LAB — PRO B NATRIURETIC PEPTIDE: PRO B NATRI PEPTIDE: 566.2 pg/mL — AB (ref 0–450)

## 2013-01-12 MED ORDER — VITAMIN D3 25 MCG (1000 UNIT) PO TABS
2000.0000 [IU] | ORAL_TABLET | Freq: Every day | ORAL | Status: DC
  Administered 2013-01-12 – 2013-01-13 (×2): 2000 [IU] via ORAL
  Filled 2013-01-12 (×2): qty 2

## 2013-01-12 MED ORDER — DILTIAZEM HCL 100 MG IV SOLR
5.0000 mg/h | INTRAVENOUS | Status: DC
  Administered 2013-01-12: 5 mg/h via INTRAVENOUS
  Filled 2013-01-12: qty 100

## 2013-01-12 MED ORDER — ACETAMINOPHEN 325 MG PO TABS: 650.0000 mg | ORAL_TABLET | ORAL | Status: DC | PRN

## 2013-01-12 MED ORDER — ADENOSINE 6 MG/2ML IV SOLN
12.0000 mg | Freq: Once | INTRAVENOUS | Status: AC
  Administered 2013-01-12: 6 mg via INTRAVENOUS
  Filled 2013-01-12: qty 4

## 2013-01-12 MED ORDER — SODIUM CHLORIDE 0.9 % IV BOLUS (SEPSIS)
1000.0000 mL | Freq: Once | INTRAVENOUS | Status: AC
  Administered 2013-01-12: 1000 mL via INTRAVENOUS

## 2013-01-12 MED ORDER — ADENOSINE 6 MG/2ML IV SOLN: 12.0000 mg | Freq: Once | INTRAVENOUS | Status: DC

## 2013-01-12 MED ORDER — DILTIAZEM HCL 25 MG/5ML IV SOLN
10.0000 mg | Freq: Once | INTRAVENOUS | Status: AC
  Administered 2013-01-12: 10 mg via INTRAVENOUS

## 2013-01-12 MED ORDER — ENOXAPARIN SODIUM 100 MG/ML ~~LOC~~ SOLN
1.0000 mg/kg | Freq: Two times a day (BID) | SUBCUTANEOUS | Status: DC
  Administered 2013-01-12 – 2013-01-13 (×2): 85 mg via SUBCUTANEOUS
  Filled 2013-01-12 (×4): qty 1

## 2013-01-12 MED ORDER — FLECAINIDE ACETATE 100 MG PO TABS
300.0000 mg | ORAL_TABLET | Freq: Once | ORAL | Status: AC
  Administered 2013-01-12: 300 mg via ORAL
  Filled 2013-01-12: qty 3

## 2013-01-12 MED ORDER — PANTOPRAZOLE SODIUM 40 MG PO TBEC
40.0000 mg | DELAYED_RELEASE_TABLET | Freq: Every day | ORAL | Status: DC
Start: 2013-01-12 — End: 2013-01-13
  Administered 2013-01-12 – 2013-01-13 (×2): 40 mg via ORAL
  Filled 2013-01-12 (×2): qty 1

## 2013-01-12 MED ORDER — CALCIUM CARBONATE 1250 (500 CA) MG PO TABS
1.0000 | ORAL_TABLET | Freq: Every day | ORAL | Status: DC
  Administered 2013-01-13: 500 mg via ORAL
  Filled 2013-01-12 (×2): qty 1

## 2013-01-12 MED ORDER — OCUVITE PO TABS
1.0000 | ORAL_TABLET | Freq: Every day | ORAL | Status: DC
  Administered 2013-01-12 – 2013-01-13 (×2): 1 via ORAL
  Filled 2013-01-12 (×2): qty 1

## 2013-01-12 MED ORDER — PANTOPRAZOLE SODIUM 40 MG PO TBEC
40.0000 mg | DELAYED_RELEASE_TABLET | Freq: Every day | ORAL | Status: DC
  Administered 2013-01-12: 40 mg via ORAL
  Filled 2013-01-12: qty 1

## 2013-01-12 MED ORDER — ONDANSETRON HCL 4 MG/2ML IJ SOLN: 4.0000 mg | Freq: Four times a day (QID) | INTRAMUSCULAR | Status: DC | PRN

## 2013-01-12 NOTE — ED Notes (Signed)
Pt aaox4 states feels better

## 2013-01-12 NOTE — ED Notes (Signed)
Pt c/o increased SOB x 1 week; pt noted to be tachycardic and sts hx of ablation in past

## 2013-01-12 NOTE — ED Notes (Signed)
Pt states that he has had loose bms for the last few days worse at night had one today he states and felt bad today felt his heart pounding so he drove himself to ER

## 2013-01-12 NOTE — Progress Notes (Deleted)
Medical records obtained from [facility].   Cardiac catheterization done on 07/27/2011 revealed normal coronary arteries. Moderate to severe global LV systolic dysfunction. LV ejection fraction of 35%. Diagnostic procedure recommendations were to continue medical therapy for low LV dysfunction.  Perry Mount PA-C  [facility]

## 2013-01-12 NOTE — ED Provider Notes (Signed)
CSN: [id number]     Arrival date & time 01/12/13  1046 History   First MD Initiated Contact with Patient 01/12/13 1056     Chief Complaint  Patient presents with  . Shortness of Breath  . Tachycardia   HPI  80 y/o male with history of atrial flutter s/p ablation who presents with cc of shortness of breath. Had an ablation on 07/08/12. He states that for the last several days he has had "gas". He states he has had a dull ache in his epigastrium that he attributes to "gas". He has had increased frequency of belching but no vomiting. He states he has had several episodes of non-bloody watery stool in the past few days. Today he was taking a shower when he began to feel short of breath. He denies any cough, fevers or chest pain.   Past Medical History  Diagnosis Date  . Hypertension   . Colon polyps   . Diverticulosis of colon (without mention of hemorrhage)   . Internal and external hemorrhoids without complication   . Gastritis, chronic     Pt denies bleeding 04/2012. EGD 2012 reportedly normal.  . COPD (chronic obstructive pulmonary disease)   . IBS (irritable bowel syndrome)   . 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 Hyponatremia     04/2012 r/t diuretic.  [name] Abnormal EKG     ST changes with normal coronaries by cath 05/06/12.  . Carotid artery calcification     By CXR - dopplers 05/06/12 without obvious evidence of significant ICA stenosis >40%  . GERD (gastroesophageal reflux disease)   . Atrial flutter     a. Slow ~100bpm when in 2:1; dx 04/2012. b. Placed on Xarelto.  c. s/p ablation 07-08-2012 by Dr Rayann Heman  . Atrial fibrillation   . Pneumonia     "couple times" (01/12/2013)  . Exertional shortness of breath   . Arthritis     "hands" (01/12/2013)   Past Surgical History  Procedure Laterality Date  . 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 Trigger finger release Right 1970's    "2 fingers"  . Hemorrhoid surgery  1968  . Colonoscopy  08/29/10    diverticulosis, internal hemorrhoids   . Esophagogastroduodenoscopy  09/20/10    normal  . Atrial flutter ablation  07-08-2012    of typical atrial flutter by Dr Rayann Heman  . Cardiac catheterization  04/2012   Family History  Problem Relation Age of Onset  . Prostate cancer Brother   . Diabetes    . Heart disease Brother     details unclear   History  Substance Use Topics  . Smoking status: Former Smoker -- 38 years    Types: Cigarettes, Pipe    Quit date: 01/09/1996  . Smokeless tobacco: Never Used     Comment:    . Alcohol Use: Yes     Comment: 01/12/2013 "did drink 2-3 12 oz beers a day; stopped drinking in 04/2012"   Review of Systems  Constitutional: Negative for fever and chills.  Respiratory: Positive for shortness of breath. Negative for cough.   Cardiovascular: Positive for palpitations. Negative for chest pain and leg swelling.  Gastrointestinal: Positive for diarrhea. Negative for nausea, vomiting and abdominal pain.  Genitourinary: Negative for dysuria and frequency.  All other systems reviewed and are negative.   Allergies  Dyazide  Home Medications   Current Outpatient Rx  Name  Route  Sig  Dispense  Refill  . diltiazem (CARDIZEM) 30  MG tablet   Oral   Take 1 tablet (30 mg total) by mouth every 6 (six) hours as needed. As needed for palpitations.   30 tablet   4    BP 154/64  Pulse 72  Temp(Src) 98 F (36.7 C) (Oral)  Resp 16  Ht 6\' 2"  (1.88 m)  Wt 153 lb 14.1 oz (69.8 kg)  BMI 19.75 kg/m2  SpO2 98% Physical Exam  Constitutional: He is oriented to person, place, and time. He appears well-developed and well-nourished. No distress.  HENT:  Head: Normocephalic and atraumatic.  Mouth/Throat: No oropharyngeal exudate.  Eyes: Conjunctivae are normal. Pupils are equal, round, and reactive to light.  Neck: Normal range of motion. Neck supple.  Cardiovascular: Normal heart sounds.  Tachycardia present.  Exam reveals no gallop and no friction rub.   No murmur heard. Pulmonary/Chest: Effort  normal and breath sounds normal.  Abdominal: Soft. He exhibits no distension. There is no tenderness.  Musculoskeletal: Normal range of motion. He exhibits no edema and no tenderness.  Neurological: He is alert and oriented to person, place, and time. He has normal strength and normal reflexes. No cranial nerve deficit or sensory deficit. Coordination normal. GCS eye subscore is 4. GCS verbal subscore is 5. GCS motor subscore is 6.  Skin: Skin is warm and dry.   ED Course  Procedures (including critical care time) Labs Review Labs Reviewed  PRO B NATRIURETIC PEPTIDE - Abnormal; Notable for the following:    Pro B Natriuretic peptide (BNP) 566.2 (*)    All other components within normal limits  COMPREHENSIVE METABOLIC PANEL - Abnormal; Notable for the following:    Sodium 136 (*)    Chloride 95 (*)    Glucose, Bld 113 (*)    GFR calc non Af Amer 59 (*)    GFR calc Af Amer 69 (*)    All other components within normal limits  CBC - Abnormal; Notable for the following:    RBC 4.19 (*)    Hemoglobin 12.4 (*)    HCT 36.6 (*)    All other components within normal limits  BASIC METABOLIC PANEL - Abnormal; Notable for the following:    Creatinine, Ser 1.48 (*)    GFR calc non Af Amer 43 (*)    GFR calc Af Amer 50 (*)    All other components within normal limits  POCT I-STAT, CHEM 8 - Abnormal; Notable for the following:    Sodium 136 (*)    Glucose, Bld 116 (*)    All other components within normal limits  POCT I-STAT, CHEM 8 - Abnormal; Notable for the following:    Glucose, Bld 103 (*)    All other components within normal limits  CBC WITH DIFFERENTIAL  LIPASE, BLOOD  TSH  HEMOGLOBIN A1C  MAGNESIUM  PROTIME-INR  LIPID PANEL  POCT I-STAT TROPONIN I  POCT I-STAT TROPONIN I   Imaging Review Dg Chest Portable 1 View  01/12/2013   CLINICAL DATA:  Short of breath.  Tachycardia  EXAM: PORTABLE CHEST - 1 VIEW  COMPARISON:  05/05/2012 go  FINDINGS: The heart size and mediastinal  contours are within normal limits. Both lungs are clear. The visualized skeletal structures are unremarkable.  IMPRESSION: No active disease.   Electronically Signed   By: Franchot Gallo M.D.   On: 01/12/2013 11:40    EKG Interpretation    Date/Time:    Ventricular Rate:    PR Interval:    QRS Duration:  QT Interval:    QTC Calculation:   R Axis:     Text Interpretation:             MDM   1. Atrial fibrillation with RVR   2. Ectopic atrial tachycardia   3. Atrial tachycardia   4. Tachycardia-bradycardia     Here with SOB. Initial EKG with atrial flutter with rate of 164. No acute ischemic changes. Troponin negative. CXR clear. BNP mildly elevated but clinically doesn't appear to be in volume overload. IVF given secondary to recent diarrhea without improvement in HR. Started on diltiazem gtt after 10 mg bolus with rate control but he patient remained in 2:1 atrial flutter. Cardiology was consulted and had EP physician Caryl Comes see the patient. Dr. Tonye Becket and evaluated the patient. The patient did not want to be admitted. Dr. Caryl Comes discussed options and recommended given 12 of adenosine to see if the patient can be converted to NSR. If converts he may go home. If not trial of 300 mg of flecainide and if not in NSR cardiology will admit. Flecainide given. Transfer of care to Dr. Silvio Clayman at 17:00.    Donita Brooks, MD 01/13/13 [id number]

## 2013-01-12 NOTE — Consult Note (Signed)
ELECTROPHYSIOLOGY CONSULT NOTE  Patient ID: Chad [name], MRN: [id number], DOB/AGE: [birth year] 80 y.o. Admit date: 01/12/2013 Date of Consult: 01/12/2013  Primary Physician: Kennon Portela, MD Primary Cardiologist: Greggory Brandy  Chief Complaint: SVT   HPI Chad [name] is a 80 y.o. male  Admitted with SOB and LH with some GI symptoms and found to have atrial tach 168, prob Left sided origin, which has slowed with diltiazem He is s/p RFCA of slow isthmus dependent atrial flutter (CL 315) without known recurrences  Prior cardiac eval  April of 2014 with dizziness and noted to be in atrial flutter. There was a question of ST elevation and he underwent cardiac catheterization on 05/06/12 which revealed normal coronary arteries. 2-D echo 4/14 showed an ejection fraction of 55-60% with mild hypokinesis of the basal septum    Past Medical History  Diagnosis Date  . Hypertension   . Colon polyps   . Diverticulosis of colon (without mention of hemorrhage)   . Internal and external hemorrhoids without complication   . Gastritis, chronic     Pt denies bleeding 04/2012. EGD 2012 reportedly normal.  . COPD (chronic obstructive pulmonary disease)   . IBS (irritable bowel syndrome)   . 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 Atrial flutter     a. Slow ~100bpm when in 2:1; dx 04/2012. b. Placed on Xarelto.  c. s/p ablation 07-08-2012 by Dr Rayann Heman  . Hyponatremia     04/2012 r/t diuretic.  [name] Abnormal EKG     ST changes with normal coronaries by cath 05/06/12.  . Carotid artery calcification     By CXR - dopplers 05/06/12 without obvious evidence of significant ICA stenosis >40%  . GERD (gastroesophageal reflux disease)   . Arthritis       Surgical History:  Past Surgical History  Procedure Laterality Date  . Oropharyngeal resection      For tongue cancer  . Hand surgery    . Hemorrhoid surgery    . Colonoscopy  08/29/10    diverticulosis, internal hemorrhoids  . Esophagogastroduodenoscopy  09/20/10   normal  . Ablation  07-08-2012    of typical atrial flutter by Dr Rayann Heman     Home Meds: Prior to Admission medications   Medication Sig Start Date End Date Taking? Authorizing Provider  calcium carbonate (OS-CAL - DOSED IN MG OF ELEMENTAL CALCIUM) 1250 MG tablet Take 1 tablet by mouth daily with breakfast.   Yes Historical Provider, MD  Cholecalciferol (RA VITAMIN D-3) 2000 UNITS CAPS Take 2,000 Units by mouth daily.    Yes Historical Provider, MD  Multiple Vitamins-Minerals (CENTRUM SILVER ADULT 50+ PO) Take 1 tablet by mouth daily.    Yes Historical Provider, MD  Multiple Vitamins-Minerals (OCUVITE PO) Take 1 capsule by mouth 2 (two) times daily.    Yes Historical Provider, MD  omeprazole (PRILOSEC) 20 MG capsule Take 1 capsule (20 mg total) by mouth daily. 09/25/12  Yes Theda Sers, PA-C    Allergies:  Allergies  Allergen Reactions  . Dyazide [Hydrochlorothiazide W-Triamterene]     Caused hyponatremia 04/2012    History   Social History  . Marital Status: Divorced    Spouse Name: N/A    Number of Children: N/A  . Years of Education: N/A   Occupational History  . Retired    Social History Main Topics  . Smoking status: Former Smoker    Quit date: 01/09/1996  . Smokeless tobacco: Never Used     Comment:  10 years   . Alcohol Use: Yes     Comment: 2-3 12 oz beers a day  . Drug Use: No  . Sexual Activity: Not on file   Other Topics Concern  . Not on file   Social History Narrative   Lives in [place] by himself     Family History  Problem Relation Age of Onset  . Prostate cancer Brother   . Diabetes    . Heart disease Brother     details unclear     ROS:  Please see the history of present illness.     All other systems reviewed and negative.    Physical Exam   Blood pressure 143/84, pulse 82, temperature 97.6 F (36.4 C), temperature source Oral, resp. rate 16, SpO2 100.00%. General: Well developed, well nourished male in no acute distress. Head:  Normocephalic, atraumatic, sclera non-icteric, no xanthomas, nares are without discharge. EENT: normal Lymph Nodes:  none Back: without scoliosis/kyphosis , no CVA tendersness Neck: Negative for carotid bruits. JVD not elevated. Lungs: Clear bilaterally to auscultation without wheezes, rales, or rhonchi. Breathing is unlabored. Heart: RRR with S1 S2. No murmur , rubs, or gallops appreciated. Abdomen: Soft, non-tender, non-distended with normoactive bowel sounds. No hepatomegaly. No rebound/guarding. No obvious abdominal masses. Msk:  Strength and tone appear normal for age. Extremities: No clubbing or cyanosis. No  edema.  Distal pedal pulses are 2+ and equal bilaterally. Skin: Warm and Dry Neuro: Alert and oriented X 3. CN III-XII intact Grossly normal sensory and motor function . Psych:  Responds to questions appropriately with a normal affect.      Labs: Cardiac Enzymes No results found for this basename: CKTOTAL, CKMB, TROPONINI,  in the last 72 hours CBC Lab Results  Component Value Date   WBC 8.7 01/12/2013   HGB 14.3 01/12/2013   HCT 42.0 01/12/2013   MCV 86.8 01/12/2013   PLT 194 01/12/2013   PROTIME: No results found for this basename: LABPROT, INR,  in the last 72 hours Chemistry  Recent Labs Lab 01/12/13 1119  01/12/13 1218  NA 136*  < > 138  K 4.0  < > 3.9  CL 95*  < > 100  CO2 25  --   --   BUN 12  < > 11  CREATININE 1.14  < > 1.10  CALCIUM 9.7  --   --   PROT 7.9  --   --   BILITOT 0.4  --   --   ALKPHOS 78  --   --   ALT 13  --   --   AST 32  --   --   GLUCOSE 113*  < > 103*  < > = values in this interval not displayed. Lipids Lab Results  Component Value Date   CHOL 156 05/06/2012   HDL 91 05/06/2012   LDLCALC 56 05/06/2012   TRIG 43 05/06/2012   BNP Pro B Natriuretic peptide (BNP)  Date/Time Value Range Status  01/12/2013 11:19 AM 566.2* 0 - 450 pg/mL Final   Miscellaneous No results found for this basename: DDIMER    Radiology/Studies:  Dg Chest  Portable 1 View  01/12/2013   CLINICAL DATA:  Short of breath.  Tachycardia  EXAM: PORTABLE CHEST - 1 VIEW  COMPARISON:  05/05/2012 go  FINDINGS: The heart size and mediastinal contours are within normal limits. Both lungs are clear. The visualized skeletal structures are unremarkable.  IMPRESSION: No active disease.   Electronically Signed  By: Franchot Gallo M.D.   On: 01/12/2013 11:40    EKG: svt with 2:1 and presumed LA origin ( upright Pwave V1)  Assessment and Plan:  Atrial tachycardia with RVR Would try and terminate with adenosine and if that is unsuccessful given normal cath in hte fall would use flecainide 300 x 1 If converts can be discharged, otherwise willneed observation    Virl Axe

## 2013-01-12 NOTE — ED Provider Notes (Signed)
I saw and evaluated the patient, reviewed the resident's note and I agree with the findings and plan.  EKG Interpretation   At time 10:48 shows Irregular tachycardia at rate 164, undetermined rate, possibly atrial fibrillation versus SVT, non specific ST abn's, diffusely, LAD, abn ECG.       Pt with elevated BP, initially HR in the 160's, on his own, HR converted into a atrial fibrillation with RVR at rate 105 in the ED.  Pt is s/p ablation in July by Dr. Rayann Heman due to h/o atrial flutter. Pt reports diarrhea for a few days, no N/V.  Describes an upper epigastric fullness.  This AM, he felt lightheaded, sudden onset which likely coincides with his more severe tachycardia.  No overt CP, sweats.  Denies cough or fevers.  No sig change in medications.    Currently, HR is tachycardic, irregular, good pulses, lungs clear.  Pt has normal mental status, no focal deficits, abd is soft, no guard or rebound.  Will start diltiazem for rate control, check labs including electrolytes, troponin and consider admission for epigastric pain and atrial fibrillation with RVR.     Saddie Benders. Jonothan Heberle, MD 01/12/13 1250

## 2013-01-12 NOTE — H&P (Signed)
Patient ID: Chad [name] MRN: [id number], DOB/AGE: 1931-10-13   Admit date: 01/12/2013   Primary Physician: Kennon Portela, MD Primary Cardiologist: Dr. Stanford Breed  Pt. Profile: Chad [name] is a 80 y/o male with history of HTN, COPD, GERD and atrial flutter s/p ablation who present to the emergency department with of shortness of breath and lightheadedness. He states that for the last several days he has had "gas" associated with a dull ache in his epigastrium. He has had increased frequency of belching but no vomiting. He states he has had several episodes of non-bloody watery stool in the past few days; sometimes waking him up from sleep. Today he was taking a shower when he began to feel short of breath and lightheaded. He denies any cough, fevers or chest pain.  Chad [name] initially presented in April of 2014 with dizziness and noted to be in atrial flutter. There was a question of ST elevation and he underwent cardiac catheterization on 05/06/12 which revealed normal coronary arteries. 2-D echo 4/14 showed an ejection fraction of 55-60% with mild hypokinesis of the basal septum. He was started on beta blocker and Xarelto for the atrial flutter. Carotid Dopplers April 2014 showed no significant stenosis. TSH normal. Patient had atrial flutter ablation on 07/08/2012. Since that time, the patient had been feeling fine.   EKG with SVT vs Afib vs Aflutter HR 164 Troponin neg BNP 566.2 CXR with no cardiopulmonary disease   Problem List  Past Medical History  Diagnosis Date  . Hypertension   . Colon polyps   . Diverticulosis of colon (without mention of hemorrhage)   . Internal and external hemorrhoids without complication   . Gastritis, chronic     Pt denies bleeding 04/2012. EGD 2012 reportedly normal.  . COPD (chronic obstructive pulmonary disease)   . IBS (irritable bowel syndrome)   . 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 Atrial flutter     a. Slow ~100bpm when in 2:1; dx 04/2012. b. Placed  on Xarelto.  c. s/p ablation 07-08-2012 by Dr Rayann Heman  . Hyponatremia     04/2012 r/t diuretic.  [name] Acute renal insufficiency     04/2012 - resolved.  . Abnormal EKG     ST changes with normal coronaries by cath 05/06/12.  . Carotid artery calcification     By CXR - dopplers 05/06/12 without obvious evidence of significant ICA stenosis >40%  . GERD (gastroesophageal reflux disease)   . Arthritis     HANDS    Past Surgical History  Procedure Laterality Date  . Oropharyngeal resection      For tongue cancer  . Hand surgery    . Hemorrhoid surgery    . Colonoscopy  08/29/10    diverticulosis, internal hemorrhoids  . Esophagogastroduodenoscopy  09/20/10    normal  . Ablation  07-08-2012    of typical atrial flutter by Dr Rayann Heman     Allergies  Allergies  Allergen Reactions  . Dyazide [Hydrochlorothiazide W-Triamterene]     Caused hyponatremia 04/2012    Home Medications  Prior to Admission medications   Medication Sig Start Date End Date Taking? Authorizing Provider  calcium carbonate (OS-CAL - DOSED IN MG OF ELEMENTAL CALCIUM) 1250 MG tablet Take 1 tablet by mouth daily with breakfast.   Yes Historical Provider, MD  Cholecalciferol (RA VITAMIN D-3) 2000 UNITS CAPS Take 2,000 Units by mouth daily.    Yes Historical Provider, MD  Multiple Vitamins-Minerals (CENTRUM SILVER ADULT 50+ PO) Take 1  tablet by mouth daily.    Yes Historical Provider, MD  Multiple Vitamins-Minerals (OCUVITE PO) Take 1 capsule by mouth 2 (two) times daily.    Yes Historical Provider, MD  omeprazole (PRILOSEC) 20 MG capsule Take 1 capsule (20 mg total) by mouth daily. 09/25/12  Yes Theda Sers, PA-C    Family History  Family History  Problem Relation Age of Onset  . Prostate cancer Brother   . Diabetes    . Heart disease Brother     details unclear    Social History  History   Social History  . Marital Status: Divorced    Spouse Name: N/A    Number of Children: N/A  . Years of Education: N/A    Occupational History  . Retired    Social History Main Topics  . Smoking status: Former Smoker    Quit date: 01/09/1996  . Smokeless tobacco: Never Used     Comment: 10 years   . Alcohol Use: Yes     Comment: 2-3 12 oz beers a day  . Drug Use: No  . Sexual Activity: Not on file   Other Topics Concern  . Not on file   Social History Narrative   Lives in [place] by himself     All other systems reviewed and are otherwise negative except as noted above.  Physical Exam  Blood pressure 170/74, pulse 80, temperature 97.6 F (36.4 C), temperature source Oral, resp. rate 22, SpO2 100.00%.  General: Pleasant, NAD. Thin appearing  Psych: Normal affect. Neuro: Alert and oriented X 3. Moves all extremities spontaneously. HEENT: Normal  Neck: Supple without bruits or no JVD. Lungs:  Resp regular and unlabored, CTA. Heart: RRR no s3, s4, or murmurs. Abdomen: Soft, non-tender, non-distended, BS + x 4.  Extremities: No clubbing, cyanosis or edema. DP/PT/Radials 2+ and equal bilaterally.  Labs  Lab Results  Component Value Date   WBC 8.7 01/12/2013   HGB 14.3 01/12/2013   HCT 42.0 01/12/2013   MCV 86.8 01/12/2013   PLT 194 01/12/2013    Recent Labs Lab 01/12/13 1119  01/12/13 1218  NA 136*  < > 138  K 4.0  < > 3.9  CL 95*  < > 100  CO2 25  --   --   BUN 12  < > 11  CREATININE 1.14  < > 1.10  CALCIUM 9.7  --   --   PROT 7.9  --   --   BILITOT 0.4  --   --   ALKPHOS 78  --   --   ALT 13  --   --   AST 32  --   --   GLUCOSE 113*  < > 103*  < > = values in this interval not displayed.    Radiology/Studies  Dg Chest Portable 1 View  01/12/2013   CLINICAL DATA:  Short of breath.  Tachycardia  EXAM: PORTABLE CHEST - 1 VIEW  COMPARISON:  05/05/2012 go  FINDINGS: The heart size and mediastinal contours are within normal limits. Both lungs are clear. The visualized skeletal structures are unremarkable.  IMPRESSION: No active disease.      ECG  Atrial flutter HR 164  ASSESSMENT  AND PLAN Chad [name] is a 80 y/o male with history of HTN, COPD, GERD and atrial flutter s/p ablation 07/2012 who presented to the emergency department with of SOB and lightheadedness and approx 1 week of non specific gastrointestinal symptoms.   Atrial tachycardia-  HR  164 on arrival, given 100mg  dilt in ED, 5-52mL/hr; HR 80 currently -- Will consult Dr. Caryl Comes with EP -- Continue diltiazem  -- Will order TEE  HTN- blood pressure elevated:170/74. -- No home antyihypertensives listed  GERD- continue omeprazole  GI symptoms- likely secondary a viral gastroenteritis -- continue to monitor and supportive care   Signed, Perry Mount, PA-C 01/12/2013, 12:58 PM  Pager [phone] As above, patient seen and examined. Briefly he is a 79 year old male with a past medical history of hypertension, COPD, atrial flutter status post ablation presenting with ectopic atrial tachycardia and diarrhea. Patient presented in April of 2014 with atrial flutter. A cardiac catheterization at that time revealed normal coronary arteries. Echocardiogram in April of 2014 showed an ejection fraction of 55-60%. TSH was normal. He had atrial flutter ablation in July of 2014. Patient over the past week has had occasional indigestion and gas particularly after eating. He also has had diarrhea. He was in the shower today and developed lightheadedness and and dyspnea. He presented to the emergency room for further evaluation. He is noted to be an ectopic atrial tachycardia. His rate has improved with Cardizem. Plan to admit and continue IV Cardizem for rate control. I have asked Dr. Caryl Comes to review for consideration of ablation. We will repeat echocardiogram. His diarrhea and indigestion and may be a viral syndrome. Add Nexium. Kirk Ruths

## 2013-01-12 NOTE — ED Provider Notes (Signed)
  Physical Exam  BP 146/80  Pulse 81  Temp(Src) 98 F (36.7 C) (Oral)  Resp 21  SpO2 99%  Physical Exam  ED Course  Procedures  MDM  Patient with history of aflutter, now in the same. Dilt given, gtt stopped. Cardiology has consulted, planned to give flecainide, recheck EKG. Will give flecainide, recheck EKG in 2-3 hours  Patient admitted to the floor in stable condition, with no acute changes during my shift.      Freddi Che, MD 01/12/13 [phone]

## 2013-01-12 NOTE — Progress Notes (Signed)
Noted. Chad [name]

## 2013-01-12 NOTE — Progress Notes (Signed)
Pt's heart rate 49-57 and irregular.  Rhythm is A-fib.  Pt denies chest pain or any other pain and VS stable.  Resp even and unlabored @ 16/minute.  Info called to [place], PA per lower heart rate.  12 Lead EKG ordered for 7:30p tonight and one in am.

## 2013-01-12 NOTE — ED Notes (Signed)
Dr Stanford Breed into see pt

## 2013-01-13 DIAGNOSIS — R0602 Shortness of breath: Secondary | ICD-10-CM | POA: Diagnosis not present

## 2013-01-13 DIAGNOSIS — I498 Other specified cardiac arrhythmias: Secondary | ICD-10-CM | POA: Diagnosis not present

## 2013-01-13 DIAGNOSIS — I495 Sick sinus syndrome: Secondary | ICD-10-CM

## 2013-01-13 DIAGNOSIS — I4892 Unspecified atrial flutter: Secondary | ICD-10-CM | POA: Diagnosis not present

## 2013-01-13 DIAGNOSIS — I4891 Unspecified atrial fibrillation: Secondary | ICD-10-CM

## 2013-01-13 DIAGNOSIS — I471 Supraventricular tachycardia: Secondary | ICD-10-CM | POA: Diagnosis not present

## 2013-01-13 DIAGNOSIS — I359 Nonrheumatic aortic valve disorder, unspecified: Secondary | ICD-10-CM | POA: Diagnosis not present

## 2013-01-13 LAB — TSH: TSH: 3.158 u[IU]/mL (ref 0.350–4.500)

## 2013-01-13 LAB — CBC
HEMATOCRIT: 36.6 % — AB (ref 39.0–52.0)
Hemoglobin: 12.4 g/dL — ABNORMAL LOW (ref 13.0–17.0)
MCH: 29.6 pg (ref 26.0–34.0)
MCHC: 33.9 g/dL (ref 30.0–36.0)
MCV: 87.4 fL (ref 78.0–100.0)
PLATELETS: 172 10*3/uL (ref 150–400)
RBC: 4.19 MIL/uL — AB (ref 4.22–5.81)
RDW: 14.6 % (ref 11.5–15.5)
WBC: 7.3 10*3/uL (ref 4.0–10.5)

## 2013-01-13 LAB — BASIC METABOLIC PANEL
BUN: 18 mg/dL (ref 6–23)
CALCIUM: 8.8 mg/dL (ref 8.4–10.5)
CO2: 24 meq/L (ref 19–32)
CREATININE: 1.48 mg/dL — AB (ref 0.50–1.35)
Chloride: 99 mEq/L (ref 96–112)
GFR calc Af Amer: 50 mL/min — ABNORMAL LOW (ref >90)
GFR calc non Af Amer: 43 mL/min — ABNORMAL LOW (ref >90)
Glucose, Bld: 76 mg/dL (ref 70–99)
Potassium: 3.9 mEq/L (ref 3.7–5.3)
Sodium: 139 mEq/L (ref 137–147)

## 2013-01-13 LAB — LIPID PANEL
Cholesterol: 169 mg/dL (ref 0–200)
HDL: 72 mg/dL (ref >39)
LDL Cholesterol: 86 mg/dL (ref 0–99)
Total CHOL/HDL Ratio: 2.3 RATIO
Triglycerides: 57 mg/dL (ref <150)
VLDL: 11 mg/dL (ref 0–40)

## 2013-01-13 LAB — PROTIME-INR
INR: 1.05 (ref 0.00–1.49)
PROTHROMBIN TIME: 13.5 s (ref 11.6–15.2)

## 2013-01-13 LAB — HEMOGLOBIN A1C
Hgb A1c MFr Bld: 5.6 % (ref <5.7)
Mean Plasma Glucose: 114 mg/dL (ref <117)

## 2013-01-13 MED ORDER — DILTIAZEM HCL 30 MG PO TABS: 30.0000 mg | ORAL_TABLET | Freq: Every day | ORAL | Status: DC | PRN

## 2013-01-13 MED ORDER — DILTIAZEM HCL 30 MG PO TABS: 30.0000 mg | ORAL_TABLET | Freq: Four times a day (QID) | ORAL | Status: DC | PRN

## 2013-01-13 NOTE — Progress Notes (Signed)
SUBJECTIVE: The patient is doing well today.  At this time, he denies chest pain, shortness of breath, or any new concerns.  He feels like his breathing is improved this morning.  No sensation of palpitations during lightheaded spell.  No prior episodes of lightheadedness.  Patient had not been on any AV nodal blocking agents at home. Given adenosine, flecainide and diltiazem yesterday.    Overnight - periods of junctional rhythm, atrial tach with intermittent 4 second pauses, atrial flutter - asymptomatic.   Echo pending this admission - last echo 04-2012 demonstrated EF 55-60%, mild hypokinesis of basal septum, moderately calcified mitral valve annulus, LA 36.   CURRENT MEDICATIONS: . beta carotene w/minerals  1 tablet Oral Daily  . calcium carbonate  1 tablet Oral Q breakfast  . cholecalciferol  2,000 Units Oral Daily  . enoxaparin (LOVENOX) injection  1 mg/kg Subcutaneous Q12H  . pantoprazole  40 mg Oral Daily      OBJECTIVE: Physical Exam: Filed Vitals:   01/12/13 2001 01/13/13 0211 01/13/13 0546 01/13/13 0640  BP: 148/57 124/51 139/68   Pulse: 63 66 74   Temp: 98.3 F (36.8 C)  98.7 F (37.1 C)   TempSrc: Oral Oral Oral   Resp: 20 20 20    Height:    6\' 2"  (1.88 m)  Weight:    153 lb 14.1 oz (69.8 kg)  SpO2: 100% 96% 96%     Intake/Output Summary (Last 24 hours) at 01/13/13 5830 Last data filed at 01/12/13 2133  Gross per 24 hour  Intake    240 ml  Output      0 ml  Net    240 ml    Telemetry reveals sinus rhythm with periods of junctional rhythm, atrial tach with intermittent 4 second pauses, atrial flutter  GEN- The patient is elderly appearing, alert and oriented x 3 today.   Head- normocephalic, atraumatic Eyes-  Sclera clear, conjunctiva pink Ears- hearing intact Oropharynx- clear Neck- supple, no JVP Lymph- no cervical lymphadenopathy Lungs- Clear to ausculation bilaterally, normal work of breathing Heart- Regular rate and rhythm, no murmurs, rubs or  gallops, PMI not laterally displaced GI- soft, NT, ND, + BS Extremities- no clubbing, cyanosis, or edema  LABS: Basic Metabolic Panel:  Recent Labs  01/12/13 1119 01/12/13 1143 01/12/13 1218 01/12/13 1920  NA 136* 136* 138  --   K 4.0 3.9 3.9  --   CL 95* 98 100  --   CO2 25  --   --   --   GLUCOSE 113* 116* 103*  --   BUN 12 12 11   --   CREATININE 1.14 1.30 1.10  --   CALCIUM 9.7  --   --   --   MG  --   --   --  1.9   Liver Function Tests:  Recent Labs  01/12/13 1119  AST 32  ALT 13  ALKPHOS 78  BILITOT 0.4  PROT 7.9  ALBUMIN 3.8    Recent Labs  01/12/13 1119  LIPASE 50   CBC:  Recent Labs  01/12/13 1119 01/12/13 1143 01/12/13 1218  WBC 8.7  --   --   NEUTROABS 6.1  --   --   HGB 13.8 16.0 14.3  HCT 41.5 47.0 42.0  MCV 86.8  --   --   PLT 194  --   --    Hemoglobin A1C:  Recent Labs  01/12/13 1920  HGBA1C 5.6   Thyroid Function Tests:  Recent Labs  01/12/13 1920  TSH 3.158    RADIOLOGY: Dg Chest Portable 1 View 01/12/2013   CLINICAL DATA:  Short of breath.  Tachycardia  EXAM: PORTABLE CHEST - 1 VIEW  COMPARISON:  05/05/2012 go  FINDINGS: The heart size and mediastinal contours are within normal limits. Both lungs are clear. The visualized skeletal structures are unremarkable.  IMPRESSION: No active disease.   Electronically Signed   By: Franchot Gallo M.D.   On: 01/12/2013 11:40    ASSESSMENT AND PLAN:  Active Problems:   Ectopic atrial tachycardia   Atrial tachycardia  1. Atrial tachycardia The patient presents with sustained atrial arrhythmias in the setting of an acute GI illness.  Given prolonged AV block and LBBB, I am reluctant to initiate antiarrhythmic drugs at this time.  Could consider tikosyn if atach returns.  Ablation is also an option, though the patient is clear that he would like to avoid another ablation. At this time, I will treat with as needed cardizem 30mg  po q6 hours for sustained tachypalpitations.  If his atrial  arrhythmias return then he may require anticoagulation and initiation of tikosyn.    2. Tachy/brady Will try to avoid PPM at this time.  Will use as needed cardizem as above  Discharge to home Follow-up with me in 4 weeks

## 2013-01-13 NOTE — Progress Notes (Signed)
Pt's heart rhythm converted to NSR- 1st degree AVB and HR been consistent in the 70's after the 4 sec pause.Will do EKG this morning.

## 2013-01-13 NOTE — Discharge Summary (Signed)
ELECTROPHYSIOLOGY DISCHARGE SUMMARY    Patient ID: Chad [name],  MRN: [id number], DOB/AGE: 08-01-1931 80 y.o.  Admit date: 01/12/2013 Discharge date: 01/13/2013  Primary Care Physician: Lou Miner, MD Primary Cardiologist: Stanford Breed, MD Primary EP: Rayann Heman, MD  Primary Discharge Diagnosis:  1. Paroxysmal atrial tachycardia 2. Probably viral gastroenteritis  Secondary Discharge Diagnoses:  1. Atrial flutter s/p RF ablation July 2014 2. HTN 3. COPD 4. GERD  Procedures This Admission:  None   History and Hospital Course:  Chad [name] is a 80 year old man with hypertension, COPD and atrial flutter status post ablation July 2014 who presented yesterday with atrial tachycardia and diarrhea.   He presented in April of 2014 with atrial flutter. A cardiac catheterization at that time revealed normal coronary arteries. Echocardiogram in April of 2014 showed an ejection fraction of 55-60%. TSH was normal. He had atrial flutter ablation in July of 2014.   Over the past week he has had occasional indigestion and gas particularly after eating. He also has had diarrhea. Yesterday he was in the shower and developed lightheadedness and dyspnea. He presented to the emergency room for further evaluation. He was noted to be in an atrial tachycardia. His rate improved with Cardizem. He was observed overnight. His GI symptoms were felt to be related to viral gastroenteritis. Today, he denies chest pain, shortness of breath or any new concerns. His SOB is improved. He has not been on any AV nodal blocking agents at home. Given adenosine, flecainide and diltiazem yesterday. Telemetry reviewed by Dr. Rayann Heman this AM - overnight revealed intermittent junctional rhythm, atrial tach with intermittent 4 second pauses, atrial flutter - asymptomatic. Per Dr. Jackalyn Lombard progress note, "Chad [name] presents with sustained atrial arrhythmias in the setting of an acute GI illness. Given prolonged AV block and LBBB, I am  reluctant to initiate antiarrhythmic drugs at this time. Could consider tikosyn if atach returns. Ablation is also an option, though the patient is clear that he would like to avoid another ablation. At this time, I will treat with as needed cardizem 30mg  po q6 hours for sustained tachypalpitations. If his atrial arrhythmias return then he may require anticoagulation and initiation of tikosyn." Regarding tachy-brady, will try to avoid PPM at this time and use only short acting diltiazem as needed. Chad [name] has been seen, examined and deemed stable for discharge home today by Dr. Thompson Grayer. He will follow-up in the office in 4 weeks.  Discharge Vitals: Blood pressure 139/68, pulse 74, temperature 98.7 F (37.1 C), temperature source Oral, resp. rate 20, height 6\' 2"  (1.88 m), weight 153 lb 14.1 oz (69.8 kg), SpO2 96.00%.   Labs: Lab Results  Component Value Date   WBC 7.3 01/13/2013   HGB 12.4* 01/13/2013   HCT 36.6* 01/13/2013   MCV 87.4 01/13/2013   PLT 172 01/13/2013    Recent Labs Lab 01/12/13 1119  01/13/13 0605  NA 136*  < > 139  K 4.0  < > 3.9  CL 95*  < > 99  CO2 25  --  24  BUN 12  < > 18  CREATININE 1.14  < > 1.48*  CALCIUM 9.7  --  8.8  PROT 7.9  --   --   BILITOT 0.4  --   --   ALKPHOS 78  --   --   ALT 13  --   --   AST 32  --   --   GLUCOSE 113*  < > 76  < > =  values in this interval not displayed.  Recent Labs  01/13/13 0605  INR 1.05    Disposition:  The patient is being discharged in stable condition.  Follow-up: Follow-up Information   Follow up with Thompson Grayer, MD On 02/11/2013. (At 11:00 AM)    Specialty:  Cardiology   Contact information:   [address] [place] [address] [phone]      Discharge Medications:    Medication List         calcium carbonate 1250 MG tablet  Commonly known as:  OS-CAL - dosed in mg of elemental calcium  Take 1 tablet by mouth daily with breakfast.     diltiazem 30 MG tablet  Commonly known as:   CARDIZEM  Take 1 tablet (30 mg total) by mouth every 6 (six) hours as needed. As needed for palpitations.     OCUVITE PO  Take 1 capsule by mouth 2 (two) times daily.     CENTRUM SILVER ADULT 50+ PO  Take 1 tablet by mouth daily.     omeprazole 20 MG capsule  Commonly known as:  PRILOSEC  Take 1 capsule (20 mg total) by mouth daily.     RA VITAMIN D-3 2000 UNITS Caps  Generic drug:  Cholecalciferol  Take 2,000 Units by mouth daily.       Duration of Discharge Encounter: Greater than 30 minutes including physician time.  Manson Passey, PA-C 01/13/2013, 8:32 AM   Thompson Grayer MD

## 2013-01-13 NOTE — Progress Notes (Signed)
  Echocardiogram 2D Echocardiogram has been performed.  Chad [name] FRANCES 01/13/2013, 12:51 PM

## 2013-01-13 NOTE — Plan of Care (Signed)
Problem: Phase III Progression Outcomes Goal: Sinus rhythm established or heart rate < 100 at rest Outcome: Completed/Met Date Met:  01/13/13 SR with 1st degree heart block Goal: Pain controlled on oral analgesia Outcome: Not Applicable Date Met:  50/72/25 Denies pain Goal: Discharge plan remains appropriate-arrangements made Outcome: Completed/Met Date Met:  01/13/13 Plans to dischage home

## 2013-01-13 NOTE — ED Provider Notes (Signed)
I saw and evaluated the patient, reviewed the resident's note and I agree with the findings and plan.  EKG Interpretation    Date/Time:    Ventricular Rate:    PR Interval:    QRS Duration:   QT Interval:    QTC Calculation:   R Axis:     Text Interpretation:                Osvaldo Shipper, MD 01/13/13 2311

## 2013-01-13 NOTE — Progress Notes (Signed)
Verbalized understanding of discharge instructions.  Denies any chest pain or palpitations.  IV site dressing changed, not bleeding at site upon release.

## 2013-01-13 NOTE — Progress Notes (Signed)
Pt HR down to 26 but non sustaining and back to 40's-50's, VS stable, asymptomatic, not complaining of chest pain , not in respiratory distress. Dr. Irene Pap was notified. Will continue to monitor pt

## 2013-01-13 NOTE — Plan of Care (Signed)
Problem: Phase II Progression Outcomes Goal: Pain controlled Outcome: Not Applicable Date Met:  34/35/68 Denies pain

## 2013-01-13 NOTE — Progress Notes (Signed)
Pt had asystole pause of 4.02 sec then had another pause of 4.42 sec after 2 mins, VS taken and stable, not in any pain, not in respiratory distress, asymptomatic. Dr. Irene Pap was notified. Will continue to monitor pt

## 2013-01-19 ENCOUNTER — Encounter: Payer: Medicare Other | Admitting: Internal Medicine

## 2013-01-20 ENCOUNTER — Ambulatory Visit (INDEPENDENT_AMBULATORY_CARE_PROVIDER_SITE_OTHER): Payer: Medicare Other | Admitting: Internal Medicine

## 2013-01-20 VITALS — BP 130/88 | HR 112 | Temp 98.3°F | Resp 18 | Ht 73.5 in | Wt 157.8 lb

## 2013-01-20 DIAGNOSIS — I10 Essential (primary) hypertension: Secondary | ICD-10-CM

## 2013-01-20 DIAGNOSIS — R1013 Epigastric pain: Secondary | ICD-10-CM

## 2013-01-20 DIAGNOSIS — I4891 Unspecified atrial fibrillation: Secondary | ICD-10-CM

## 2013-01-20 DIAGNOSIS — R143 Flatulence: Secondary | ICD-10-CM

## 2013-01-20 DIAGNOSIS — R140 Abdominal distension (gaseous): Secondary | ICD-10-CM

## 2013-01-20 DIAGNOSIS — R142 Eructation: Secondary | ICD-10-CM

## 2013-01-20 DIAGNOSIS — R141 Gas pain: Secondary | ICD-10-CM | POA: Diagnosis not present

## 2013-01-20 DIAGNOSIS — E611 Iron deficiency: Secondary | ICD-10-CM

## 2013-01-20 DIAGNOSIS — E538 Deficiency of other specified B group vitamins: Secondary | ICD-10-CM | POA: Diagnosis not present

## 2013-01-20 LAB — COMPREHENSIVE METABOLIC PANEL
ALBUMIN: 4.1 g/dL (ref 3.5–5.2)
ALT: 16 U/L (ref 0–53)
AST: 26 U/L (ref 0–37)
Alkaline Phosphatase: 63 U/L (ref 39–117)
BUN: 15 mg/dL (ref 6–23)
CALCIUM: 9.5 mg/dL (ref 8.4–10.5)
CHLORIDE: 101 meq/L (ref 96–112)
CO2: 28 mEq/L (ref 19–32)
Creat: 1.12 mg/dL (ref 0.50–1.35)
Glucose, Bld: 97 mg/dL (ref 70–99)
Potassium: 4.1 mEq/L (ref 3.5–5.3)
Sodium: 140 mEq/L (ref 135–145)
Total Bilirubin: 0.4 mg/dL (ref 0.3–1.2)
Total Protein: 6.9 g/dL (ref 6.0–8.3)

## 2013-01-20 LAB — POCT UA - MICROSCOPIC ONLY
Bacteria, U Microscopic: NEGATIVE
CASTS, UR, LPF, POC: NEGATIVE
CRYSTALS, UR, HPF, POC: NEGATIVE
WBC, Ur, HPF, POC: NEGATIVE
Yeast, UA: NEGATIVE

## 2013-01-20 LAB — POCT CBC
Granulocyte percent: 67.8 %G (ref 37–80)
HCT, POC: 44.7 % (ref 43.5–53.7)
HEMOGLOBIN: 13.9 g/dL — AB (ref 14.1–18.1)
Lymph, poc: 1.4 (ref 0.6–3.4)
MCH: 28.8 pg (ref 27–31.2)
MCHC: 31.1 g/dL — AB (ref 31.8–35.4)
MCV: 92.8 fL (ref 80–97)
MID (CBC): 0.7 (ref 0–0.9)
MPV: 12.8 fL (ref 0–99.8)
PLATELET COUNT, POC: 192 10*3/uL (ref 142–424)
POC Granulocyte: 4.4 (ref 2–6.9)
POC LYMPH PERCENT: 21.1 %L (ref 10–50)
POC MID %: 11.1 %M (ref 0–12)
RBC: 4.82 M/uL (ref 4.69–6.13)
RDW, POC: 15 %
WBC: 6.5 10*3/uL (ref 4.6–10.2)

## 2013-01-20 LAB — POCT URINALYSIS DIPSTICK
BILIRUBIN UA: NEGATIVE
GLUCOSE UA: NEGATIVE
Ketones, UA: 15
LEUKOCYTES UA: NEGATIVE
NITRITE UA: NEGATIVE
Protein, UA: 30
RBC UA: NEGATIVE
Spec Grav, UA: 1.025
UROBILINOGEN UA: 1
pH, UA: 5

## 2013-01-20 MED ORDER — CILIDINIUM-CHLORDIAZEPOXIDE 2.5-5 MG PO CAPS: 1.0000 | ORAL_CAPSULE | Freq: Three times a day (TID) | ORAL | Status: DC

## 2013-01-20 MED ORDER — CYANOCOBALAMIN 1000 MCG/ML IJ SOLN
1000.0000 ug | Freq: Once | INTRAMUSCULAR | Status: AC
  Administered 2013-01-20: 1000 ug via INTRAMUSCULAR

## 2013-01-20 NOTE — Progress Notes (Signed)
   Subjective:    Patient ID: Chad [name], male    DOB: [birth year], 80 y.o.   MRN: [id number]  HPI Has bloating, belching, and mid epigastic pressure, hx of full gi w/up by Dr. Carlean Purl last year. Atrial flutter and SVT is now controlled after very recent event and hospitalization. No loss of appetite or weight loss.  B12 noted to be borderline low on chart review  Review of Systems     Objective:   Physical Exam  Vitals reviewed. Constitutional: He is oriented to person, place, and time. He appears well-developed and well-nourished.  HENT:  Head: Normocephalic.  Eyes: EOM are normal. Pupils are equal, round, and reactive to light. No scleral icterus.  Neck: Normal range of motion. No thyromegaly present.  Cardiovascular: Regular rhythm, S1 normal and intact distal pulses.  Tachycardia present.  Exam reveals gallop.   Pulmonary/Chest: Effort normal and breath sounds normal.  Abdominal: Soft. He exhibits no distension. There is no tenderness. There is no rebound and no guarding.  Lymphadenopathy:    He has no cervical adenopathy.  Neurological: He is alert and oriented to person, place, and time. He exhibits normal muscle tone. Coordination normal.  Psychiatric: He has a normal mood and affect.   Results for orders placed in visit on 01/20/13  POCT CBC      Result Value Range   WBC 6.5  4.6 - 10.2 K/uL   Lymph, poc 1.4  0.6 - 3.4   POC LYMPH PERCENT 21.1  10 - 50 %L   MID (cbc) 0.7  0 - 0.9   POC MID % 11.1  0 - 12 %M   POC Granulocyte 4.4  2 - 6.9   Granulocyte percent 67.8  37 - 80 %G   RBC 4.82  4.69 - 6.13 M/uL   Hemoglobin 13.9 (*) 14.1 - 18.1 g/dL   HCT, POC 44.7  43.5 - 53.7 %   MCV 92.8  80 - 97 fL   MCH, POC 28.8  27 - 31.2 pg   MCHC 31.1 (*) 31.8 - 35.4 g/dL   RDW, POC 15.0     Platelet Count, POC 192  142 - 424 K/uL   MPV 12.8  0 - 99.8 fL  POCT URINALYSIS DIPSTICK      Result Value Range   Color, UA yellow     Clarity, UA clear     Glucose, UA neg       Bilirubin, UA neg     Ketones, UA 15     Spec Grav, UA 1.025     Blood, UA neg     pH, UA 5.0     Protein, UA 30     Urobilinogen, UA 1.0     Nitrite, UA neg     Leukocytes, UA Negative    POCT UA - MICROSCOPIC ONLY      Result Value Range   WBC, Ur, HPF, POC neg     RBC, urine, microscopic 0-2     Bacteria, U Microscopic neg     Mucus, UA trace     Epithelial cells, urine per micros 0-2     Crystals, Ur, HPF, POC neg     Casts, Ur, LPF, POC neg     Yeast, UA neg     No uti       Assessment & Plan:  Bloating/Belching--chronic/consider Dr. Lorayne Bender Trial librax TID B12 injection for deficiency [place] right ro GB disease

## 2013-01-25 ENCOUNTER — Encounter: Payer: Self-pay | Admitting: Family Medicine

## 2013-01-26 ENCOUNTER — Ambulatory Visit
Admission: RE | Admit: 2013-01-26 | Discharge: 2013-01-26 | Disposition: A | Discharge status: Home / Self Care | Payer: Medicare Other | Source: Ambulatory Visit | Attending: Internal Medicine | Admitting: Internal Medicine

## 2013-01-26 ENCOUNTER — Encounter: Payer: Self-pay | Admitting: Radiology

## 2013-01-26 DIAGNOSIS — R1013 Epigastric pain: Secondary | ICD-10-CM | POA: Diagnosis not present

## 2013-01-26 DIAGNOSIS — R142 Eructation: Secondary | ICD-10-CM

## 2013-01-26 DIAGNOSIS — R140 Abdominal distension (gaseous): Secondary | ICD-10-CM

## 2013-02-11 ENCOUNTER — Encounter: Payer: Self-pay | Admitting: Internal Medicine

## 2013-02-11 ENCOUNTER — Ambulatory Visit (INDEPENDENT_AMBULATORY_CARE_PROVIDER_SITE_OTHER): Payer: Medicare Other | Admitting: Internal Medicine

## 2013-02-11 VITALS — BP 156/80 | HR 83 | Ht 73.5 in | Wt 176.8 lb

## 2013-02-11 DIAGNOSIS — I498 Other specified cardiac arrhythmias: Secondary | ICD-10-CM | POA: Diagnosis not present

## 2013-02-11 DIAGNOSIS — I4892 Unspecified atrial flutter: Secondary | ICD-10-CM

## 2013-02-11 DIAGNOSIS — R269 Unspecified abnormalities of gait and mobility: Secondary | ICD-10-CM

## 2013-02-11 DIAGNOSIS — I10 Essential (primary) hypertension: Secondary | ICD-10-CM

## 2013-02-11 DIAGNOSIS — I471 Supraventricular tachycardia: Secondary | ICD-10-CM

## 2013-02-11 DIAGNOSIS — R2681 Unsteadiness on feet: Secondary | ICD-10-CM | POA: Insufficient documentation

## 2013-02-11 NOTE — Patient Instructions (Signed)
Your physician wants you to follow-up in: 4 months with Dr Stanford Breed and as needed with Dr Rayann Heman

## 2013-02-11 NOTE — Progress Notes (Signed)
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  Since his recent hospital discharge, the patient reports doing very well.  He is unaware of any further SVT.  He has some unsteadiness which he says began after a B12 shot.  He has not fallen.  Today, he denies symptoms of palpitations, chest pain, shortness of breath,  lower extremity edema, dizziness, presyncope, or syncope.  The patient is otherwise without complaint today.   Past Medical History  Diagnosis Date  . Hypertension   . Colon polyps   . Diverticulosis of colon (without mention of hemorrhage)   . Internal and external hemorrhoids without complication   . Gastritis, chronic     Pt denies bleeding 04/2012. EGD 2012 reportedly normal.  . COPD (chronic obstructive pulmonary disease)   . IBS (irritable bowel syndrome)   . 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 Hyponatremia     04/2012 r/t diuretic.  [name] Abnormal EKG     ST changes with normal coronaries by cath 05/06/12.  . Carotid artery calcification     By CXR - dopplers 05/06/12 without obvious evidence of significant ICA stenosis >40%  . GERD (gastroesophageal reflux disease)   . Atrial flutter     a. Slow ~100bpm when in 2:1; dx 04/2012. b. Placed on Xarelto.  c. s/p ablation 07-08-2012 by Dr Chad [name]  . Atrial fibrillation   . Pneumonia     "couple times" (01/12/2013)  . Exertional shortness of breath   . Arthritis     "hands" (01/12/2013)  . Atrial tachycardia    Past Surgical History  Procedure Laterality Date  . 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 Trigger finger release Right 1970's    "2 fingers"  . Hemorrhoid surgery  1968  . Colonoscopy  08/29/10    diverticulosis, internal hemorrhoids  . Esophagogastroduodenoscopy  09/20/10    normal  . Atrial flutter ablation  07-08-2012    of typical atrial flutter by Dr Chad [name]  . Cardiac catheterization  04/2012     Current Outpatient Prescriptions  Medication Sig Dispense Refill  . calcium carbonate (OS-CAL - DOSED IN MG OF ELEMENTAL CALCIUM) 1250 MG tablet Take 1 tablet by mouth daily with breakfast.      . Cholecalciferol (RA VITAMIN D-3) 2000 UNITS CAPS Take 2,000 Units by mouth daily.       . clidinium-chlordiazePOXIDE (LIBRAX) 5-2.5 MG per capsule Take 1 capsule by mouth 3 (three) times daily before meals.  90 capsule  3  . diltiazem (CARDIZEM) 30 MG tablet Take 1 tablet (30 mg total) by mouth every 6 (six) hours as needed. As needed for palpitations.  30 tablet  4  . Multiple Vitamins-Minerals (CENTRUM SILVER ADULT 50+ PO) Take 1 tablet by mouth daily.       . Multiple Vitamins-Minerals (OCUVITE PO) Take 1 capsule by mouth 2 (two) times daily.       [name] omeprazole (PRILOSEC) 20 MG capsule Take 1 capsule (20 mg total) by mouth daily.  90 capsule  1   No current facility-administered medications for this visit.    Physical Exam: Filed Vitals:   02/11/13 1117  BP: 156/80  Pulse: 83  Height: 6' 1.5" (1.867 m)  Weight: 176 lb 12.8 oz (80.196 kg)    GEN- The patient is well appearing, alert and oriented x 3 today.   Mildly unsteady with ambulation  Head- normocephalic, atraumatic Eyes-  Sclera clear, conjunctiva pink Ears- hearing intact Oropharynx- clear Lungs- Clear to ausculation bilaterally, normal work of breathing Heart- Regular rate and rhythm, no murmurs, rubs or gallops, PMI not laterally displaced GI- soft, NT, ND, + BS Extremities- no clubbing, cyanosis, or edema Neuro- no focal findings, generally unsteady, strength/ sensation are intact  ekg today reveals sinus rhythm 83 bpm, PR 286,  Assessment and Plan:  1.  Atrial tachycardia Doing well without symptoms No changes today He remains clear in his decision to avoid ablation  2. HTN Stable No change required today Salt restriction is advised  3. Unsteadiness No focal findings today He will follow-up with Dr  Chad [name]

## 2013-02-19 ENCOUNTER — Other Ambulatory Visit: Payer: Self-pay | Admitting: Physician Assistant

## 2013-03-11 DIAGNOSIS — H353290 Exudative age-related macular degeneration, unspecified eye, stage unspecified: Secondary | ICD-10-CM | POA: Diagnosis not present

## 2013-03-17 DIAGNOSIS — H353190 Nonexudative age-related macular degeneration, unspecified eye, stage unspecified: Secondary | ICD-10-CM | POA: Diagnosis not present

## 2013-03-17 DIAGNOSIS — H353290 Exudative age-related macular degeneration, unspecified eye, stage unspecified: Secondary | ICD-10-CM | POA: Diagnosis not present

## 2013-03-17 DIAGNOSIS — H43819 Vitreous degeneration, unspecified eye: Secondary | ICD-10-CM | POA: Diagnosis not present

## 2013-03-17 DIAGNOSIS — H2510 Age-related nuclear cataract, unspecified eye: Secondary | ICD-10-CM | POA: Diagnosis not present

## 2013-04-16 DIAGNOSIS — H353290 Exudative age-related macular degeneration, unspecified eye, stage unspecified: Secondary | ICD-10-CM | POA: Diagnosis not present

## 2013-05-14 DIAGNOSIS — H353290 Exudative age-related macular degeneration, unspecified eye, stage unspecified: Secondary | ICD-10-CM | POA: Diagnosis not present

## 2013-06-08 ENCOUNTER — Encounter: Payer: Self-pay | Admitting: Cardiology

## 2013-06-08 ENCOUNTER — Ambulatory Visit (INDEPENDENT_AMBULATORY_CARE_PROVIDER_SITE_OTHER): Payer: Medicare Other | Admitting: Cardiology

## 2013-06-08 VITALS — BP 160/80 | HR 82 | Ht 73.0 in | Wt 169.1 lb

## 2013-06-08 DIAGNOSIS — I359 Nonrheumatic aortic valve disorder, unspecified: Secondary | ICD-10-CM | POA: Diagnosis not present

## 2013-06-08 DIAGNOSIS — I10 Essential (primary) hypertension: Secondary | ICD-10-CM

## 2013-06-08 DIAGNOSIS — I498 Other specified cardiac arrhythmias: Secondary | ICD-10-CM | POA: Diagnosis not present

## 2013-06-08 DIAGNOSIS — I4892 Unspecified atrial flutter: Secondary | ICD-10-CM

## 2013-06-08 DIAGNOSIS — I4719 Other supraventricular tachycardia: Secondary | ICD-10-CM

## 2013-06-08 DIAGNOSIS — I471 Supraventricular tachycardia: Secondary | ICD-10-CM

## 2013-06-08 DIAGNOSIS — I350 Nonrheumatic aortic (valve) stenosis: Secondary | ICD-10-CM

## 2013-06-08 NOTE — Assessment & Plan Note (Signed)
Status post ablation. 

## 2013-06-08 NOTE — Progress Notes (Signed)
HPI: FU atrial fibrillation and atrial flutter. Patient presented in April of 2014 with dizziness and noted to be in atrial flutter. Cardiac catheterization on 05/06/12 revealed normal coronary arteries. Carotid Dopplers April 2014 showed no significant stenosis. TSH normal. Patient had atrial flutter ablation on 07/08/2012. Echo 1/15 showed normal LV function, calcified AOV (visually moderate AS but mean gradient 8 mmHg), biatrial enlargement. Admitted with atrial tachycardia 1/15. Declined ablation. Treated with short acting cardizem as he had evidence of tachy brady. Since last seen, the patient has dyspnea with more extreme activities but not with routine activities. It is relieved with rest. It is not associated with chest pain. There is no orthopnea, PND or pedal edema. There is no syncope or palpitations. There is no exertional chest pain.    Current Outpatient Prescriptions  Medication Sig Dispense Refill  . calcium carbonate (OS-CAL - DOSED IN MG OF ELEMENTAL CALCIUM) 1250 MG tablet Take 1 tablet by mouth daily with breakfast.      . Cholecalciferol (RA VITAMIN D-3) 2000 UNITS CAPS Take 2,000 Units by mouth daily.       . clidinium-chlordiazePOXIDE (LIBRAX) 5-2.5 MG per capsule Take 1 capsule by mouth 3 (three) times daily before meals.  90 capsule  3  . diltiazem (CARDIZEM) 30 MG tablet Take 1 tablet (30 mg total) by mouth every 6 (six) hours as needed. As needed for palpitations.  30 tablet  4  . Multiple Vitamins-Minerals (CENTRUM SILVER ADULT 50+ PO) Take 1 tablet by mouth daily.       . Multiple Vitamins-Minerals (OCUVITE PO) Take 1 capsule by mouth 2 (two) times daily.       [name] omeprazole (PRILOSEC) 20 MG capsule TAKE 1 CAPSULE DAILY  90 capsule  1   No current facility-administered medications for this visit.     Past Medical History  Diagnosis Date  . Hypertension   . Colon polyps   . Diverticulosis of colon (without mention of hemorrhage)   . Internal and external  hemorrhoids without complication   . Gastritis, chronic     Pt denies bleeding 04/2012. EGD 2012 reportedly normal.  . COPD (chronic obstructive pulmonary disease)   . IBS (irritable bowel syndrome)   . 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 Hyponatremia     04/2012 r/t diuretic.  [name] Abnormal EKG     ST changes with normal coronaries by cath 05/06/12.  . Carotid artery calcification     By CXR - dopplers 05/06/12 without obvious evidence of significant ICA stenosis >40%  . GERD (gastroesophageal reflux disease)   . Atrial flutter     a. Slow ~100bpm when in 2:1; dx 04/2012. b. Placed on Xarelto.  c. s/p ablation 07-08-2012 by Dr Rayann Heman  . Atrial fibrillation   . Pneumonia     "couple times" (01/12/2013)  . Exertional shortness of breath   . Arthritis     "hands" (01/12/2013)  . Atrial tachycardia     Past Surgical History  Procedure Laterality Date  . 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 Trigger finger release Right 1970's    "2 fingers"  . Hemorrhoid surgery  1968  . Colonoscopy  08/29/10    diverticulosis, internal hemorrhoids  . Esophagogastroduodenoscopy  09/20/10    normal  . Atrial flutter ablation  07-08-2012    of typical atrial flutter by Dr Rayann Heman  . Cardiac catheterization  04/2012    History   Social History  .  Marital Status: Divorced    Spouse Name: N/A    Number of Children: N/A  . Years of Education: N/A   Occupational History  . Retired    Social History Main Topics  . Smoking status: Former Smoker -- 38 years    Types: Cigarettes, Pipe    Quit date: 01/09/1996  . Smokeless tobacco: Never Used     Comment:    . Alcohol Use: Yes     Comment: 01/12/2013 "did drink 2-3 12 oz beers a day; stopped drinking in 04/2012"  . Drug Use: No  . Sexual Activity: No   Other Topics Concern  . Not on file   Social History Narrative   Lives in [place] by himself    ROS: Fatigue but no fevers or chills, productive cough, hemoptysis, dysphasia, odynophagia, melena,  hematochezia, dysuria, hematuria, rash, seizure activity, orthopnea, PND, pedal edema, claudication. Remaining systems are negative.  Physical Exam: Well-developed well-nourished in no acute distress.  Skin is warm and dry.  HEENT is normal.  Neck is supple.  Chest is clear to auscultation with normal expansion.  Cardiovascular exam is regular rate and rhythm. 1/6 systolic murmur left sternal border Abdominal exam nontender or distended. No masses palpated. Extremities show no edema. neuro grossly intact  ECG Sinus rhythm at a rate of 82. First-degree AV block. Left anterior fascicular block. Cannot rule out prior septal infarct.

## 2013-06-08 NOTE — Assessment & Plan Note (Signed)
No recurrent episodes. Continue regular Cardizem as needed. Not on daily long-acting Cardizem given history of tachybradycardia syndrome. We would like to avoid pacemaker if possible. Consider ablation if he has more frequent episodes in the future. 

## 2013-06-08 NOTE — Patient Instructions (Signed)
Your physician wants you to follow-up in: Chad [name] will receive a reminder letter in the mail two months in advance. If you don't receive a letter, please call our office to schedule the follow-up appointment.   BLOOD PRESSURE SHOULD BE LESS THAN 150 ON THE TOP AND LESS THAN 85 ON THE BOTTOM

## 2013-06-08 NOTE — Assessment & Plan Note (Signed)
Mild on most recent echocardiogram but visually looked worse. No symptoms. We will repeat study in the future.

## 2013-06-08 NOTE — Assessment & Plan Note (Signed)
Blood pressure mildly elevated. I have asked him to follow this at home and we will add medications as needed.

## 2013-06-11 DIAGNOSIS — H353290 Exudative age-related macular degeneration, unspecified eye, stage unspecified: Secondary | ICD-10-CM | POA: Diagnosis not present

## 2013-07-02 DIAGNOSIS — H353290 Exudative age-related macular degeneration, unspecified eye, stage unspecified: Secondary | ICD-10-CM | POA: Diagnosis not present

## 2013-07-14 ENCOUNTER — Other Ambulatory Visit: Payer: Self-pay | Admitting: Internal Medicine

## 2013-08-03 DIAGNOSIS — H353290 Exudative age-related macular degeneration, unspecified eye, stage unspecified: Secondary | ICD-10-CM | POA: Diagnosis not present

## 2013-08-18 ENCOUNTER — Ambulatory Visit (INDEPENDENT_AMBULATORY_CARE_PROVIDER_SITE_OTHER): Payer: Medicare Other

## 2013-08-18 ENCOUNTER — Ambulatory Visit (INDEPENDENT_AMBULATORY_CARE_PROVIDER_SITE_OTHER): Payer: Medicare Other | Admitting: Internal Medicine

## 2013-08-18 VITALS — BP 124/78 | HR 90 | Temp 97.9°F | Resp 20 | Ht 73.0 in | Wt 166.8 lb

## 2013-08-18 DIAGNOSIS — G629 Polyneuropathy, unspecified: Secondary | ICD-10-CM

## 2013-08-18 DIAGNOSIS — M79609 Pain in unspecified limb: Secondary | ICD-10-CM

## 2013-08-18 DIAGNOSIS — K589 Irritable bowel syndrome without diarrhea: Secondary | ICD-10-CM | POA: Diagnosis not present

## 2013-08-18 DIAGNOSIS — G609 Hereditary and idiopathic neuropathy, unspecified: Secondary | ICD-10-CM | POA: Diagnosis not present

## 2013-08-18 DIAGNOSIS — E538 Deficiency of other specified B group vitamins: Secondary | ICD-10-CM | POA: Diagnosis not present

## 2013-08-18 DIAGNOSIS — M79672 Pain in left foot: Secondary | ICD-10-CM

## 2013-08-18 DIAGNOSIS — M79671 Pain in right foot: Secondary | ICD-10-CM

## 2013-08-18 LAB — POCT CBC
GRANULOCYTE PERCENT: 72.3 % (ref 37–80)
HCT, POC: 42.5 % — AB (ref 43.5–53.7)
Hemoglobin: 13.4 g/dL — AB (ref 14.1–18.1)
Lymph, poc: 1.9 (ref 0.6–3.4)
MCH, POC: 27.9 pg (ref 27–31.2)
MCHC: 31.6 g/dL — AB (ref 31.8–35.4)
MCV: 88.2 fL (ref 80–97)
MID (CBC): 0.5 (ref 0–0.9)
MPV: 8.5 fL (ref 0–99.8)
POC GRANULOCYTE: 6.3 (ref 2–6.9)
POC LYMPH %: 21.5 % (ref 10–50)
POC MID %: 6.2 %M (ref 0–12)
Platelet Count, POC: 209 10*3/uL (ref 142–424)
RBC: 4.81 M/uL (ref 4.69–6.13)
RDW, POC: 15.1 %
WBC: 8.7 10*3/uL (ref 4.6–10.2)

## 2013-08-18 LAB — POCT SEDIMENTATION RATE: POCT SED RATE: 20 mm/h (ref 0–22)

## 2013-08-18 MED ORDER — CILIDINIUM-CHLORDIAZEPOXIDE 2.5-5 MG PO CAPS: 1.0000 | ORAL_CAPSULE | Freq: Three times a day (TID) | ORAL | Status: DC

## 2013-08-18 MED ORDER — CYANOCOBALAMIN 1000 MCG/ML IJ SOLN
1000.0000 ug | Freq: Once | INTRAMUSCULAR | Status: AC
  Administered 2013-08-18: 1000 ug via INTRAMUSCULAR

## 2013-08-18 MED ORDER — GABAPENTIN 300 MG PO CAPS: 300.0000 mg | ORAL_CAPSULE | Freq: Every day | ORAL | Status: DC

## 2013-08-18 NOTE — Patient Instructions (Addendum)

## 2013-08-18 NOTE — Progress Notes (Signed)
Subjective:    Patient ID: Chad [name], male    DOB: 11-13-1931, 80 y.o.   MRN: [id number]  HPI  Pt presents today with bilateral foot tingling and paresthesias that started 2-3 weeks ago. Has had this before when he had a cardiac ablation but it's worse this time Painful to walk. Also has B12 def and last injection was 1 year ago. No back pain or sciatica. Has normal touch sensation, painful when walking.  On PPI long term can cause B12 malabsorption   Review of Systems See list of problems.    Objective:   Physical Exam  Constitutional: He is oriented to person, place, and time. He appears well-developed and well-nourished. No distress.  HENT:  Head: Normocephalic.  Mouth/Throat: Oropharynx is clear and moist.  Eyes: EOM are normal. No scleral icterus.  Neck: Normal range of motion.  Cardiovascular: Normal rate, regular rhythm, S1 normal, S2 normal and intact distal pulses.  Exam reveals no gallop.   Murmur heard. Pulmonary/Chest: Effort normal and breath sounds normal.  Abdominal: Soft. There is no tenderness.  Musculoskeletal: He exhibits no edema and no tenderness.  Neurological: He is alert and oriented to person, place, and time. He has normal strength. He displays tremor. He displays normal reflexes. A sensory deficit is present. No cranial nerve deficit. He exhibits normal muscle tone. He displays a negative Romberg sign. Coordination and gait normal.  Reflex Scores:      Patellar reflexes are 3+ on the right side and 3+ on the left side.      Achilles reflexes are 0 on the right side and 0 on the left side. Decreased vibratory sensation Absent ankle reflexes  Walking without pain at this time  Skin: Skin is warm, dry and intact.  Psychiatric: He has a normal mood and affect. His behavior is normal. Thought content normal. Cognition and memory are normal.   [facility] reading (PRIMARY) by  Dr.Hiram Mciver calcified femoral arterys, severe DDD L5-S1         Assessment  & Plan:  Painful , tingling feet--Neuropathic Possible causes B12 def, circulatory obstruction, spinal DDD and DJD  B12 given IM Gabapentin 300mg  hs trial  Consider leg dopplers, ncs, neurology consult

## 2013-08-19 ENCOUNTER — Other Ambulatory Visit: Payer: Self-pay | Admitting: Internal Medicine

## 2013-08-19 LAB — VITAMIN B12

## 2013-08-23 ENCOUNTER — Encounter: Payer: Self-pay | Admitting: Radiology

## 2013-09-01 DIAGNOSIS — H353290 Exudative age-related macular degeneration, unspecified eye, stage unspecified: Secondary | ICD-10-CM | POA: Diagnosis not present

## 2013-09-21 ENCOUNTER — Ambulatory Visit (INDEPENDENT_AMBULATORY_CARE_PROVIDER_SITE_OTHER): Payer: Medicare Other | Admitting: Internal Medicine

## 2013-09-21 VITALS — BP 122/80 | HR 72 | Temp 97.5°F | Resp 16 | Ht 73.0 in | Wt 171.8 lb

## 2013-09-21 DIAGNOSIS — R209 Unspecified disturbances of skin sensation: Secondary | ICD-10-CM

## 2013-09-21 DIAGNOSIS — K2100 Gastro-esophageal reflux disease with esophagitis, without bleeding: Secondary | ICD-10-CM

## 2013-09-21 DIAGNOSIS — R202 Paresthesia of skin: Secondary | ICD-10-CM

## 2013-09-21 MED ORDER — OMEPRAZOLE 20 MG PO CPDR: 20.0000 mg | DELAYED_RELEASE_CAPSULE | Freq: Every day | ORAL | Status: DC

## 2013-09-21 NOTE — Patient Instructions (Signed)
Gastroesophageal Reflux Disease, Adult Gastroesophageal reflux disease (GERD) happens when acid from your stomach flows up into the esophagus. When acid comes in contact with the esophagus, the acid causes soreness (inflammation) in the esophagus. Over time, GERD may create small holes (ulcers) in the lining of the esophagus. CAUSES   Increased body weight. This puts pressure on the stomach, making acid rise from the stomach into the esophagus.  Smoking. This increases acid production in the stomach.  Drinking alcohol. This causes decreased pressure in the lower esophageal sphincter (valve or ring of muscle between the esophagus and stomach), allowing acid from the stomach into the esophagus.  Late evening meals and a full stomach. This increases pressure and acid production in the stomach.  A malformed lower esophageal sphincter. Sometimes, no cause is found. SYMPTOMS   Burning pain in the lower part of the mid-chest behind the breastbone and in the mid-stomach area. This may occur twice a week or more often.  Trouble swallowing.  Sore throat.  Dry cough.  Asthma-like symptoms including chest tightness, shortness of breath, or wheezing. DIAGNOSIS  Your caregiver may be able to diagnose GERD based on your symptoms. In some cases, X-rays and other tests may be done to check for complications or to check the condition of your stomach and esophagus. TREATMENT  Your caregiver may recommend over-the-counter or prescription medicines to help decrease acid production. Ask your caregiver before starting or adding any new medicines.  HOME CARE INSTRUCTIONS   Change the factors that you can control. Ask your caregiver for guidance concerning weight loss, quitting smoking, and alcohol consumption.  Avoid foods and drinks that make your symptoms worse, such as:  Caffeine or alcoholic drinks.  Chocolate.  Peppermint or mint flavorings.  Garlic and onions.  Spicy foods.  Citrus fruits,  such as oranges, lemons, or limes.  Tomato-based foods such as sauce, chili, salsa, and pizza.  Fried and fatty foods.  Avoid lying down for the 3 hours prior to your bedtime or prior to taking a nap.  Eat small, frequent meals instead of large meals.  Wear loose-fitting clothing. Do not wear anything tight around your waist that causes pressure on your stomach.  Raise the head of your bed 6 to 8 inches with wood blocks to help you sleep. Extra pillows will not help.  Only take over-the-counter or prescription medicines for pain, discomfort, or fever as directed by your caregiver.  Do not take aspirin, ibuprofen, or other nonsteroidal anti-inflammatory drugs (NSAIDs). SEEK IMMEDIATE MEDICAL CARE IF:   You have pain in your arms, neck, jaw, teeth, or back.  Your pain increases or changes in intensity or duration.  You develop nausea, vomiting, or sweating (diaphoresis).  You develop shortness of breath, or you faint.  Your vomit is green, yellow, black, or looks like coffee grounds or blood.  Your stool is red, bloody, or black. These symptoms could be signs of other problems, such as heart disease, gastric bleeding, or esophageal bleeding. MAKE SURE YOU:   Understand these instructions.  Will watch your condition.  Will get help right away if you are not doing well or get worse. Document Released: 10/04/2004 Document Revised: 03/19/2011 Document Reviewed: 07/14/2010 Exit[employer] Patient Information 2015 [employer]. This information is not intended to replace advice given to you by your health care provider. Make sure you discuss any questions you have with your health care provider.  

## 2013-09-21 NOTE — Progress Notes (Signed)
   Subjective:    Patient ID: Chad [name], male    DOB: [birth year], 80 y.o.   MRN: [id number]  [place][age] y/o male F/u up from 08-18-13 for bilat foot tingling Comes with some relief has some tingling around toes  No pain in feet No fever/chills Will get flu shot at [place]    Review of Systems     Objective:   Physical Exam  Vitals reviewed. Constitutional: He is oriented to person, place, and time. He appears well-developed and well-nourished. No distress.  HENT:  Head: Normocephalic.  Eyes: EOM are normal. Pupils are equal, round, and reactive to light. No scleral icterus.  Neck: Normal range of motion.  Pulmonary/Chest: Effort normal.  Abdominal: There is no tenderness.  Musculoskeletal: He exhibits no tenderness.  Neurological: He is alert and oriented to person, place, and time. He has normal strength. No cranial nerve deficit or sensory deficit. He exhibits normal muscle tone. Coordination normal.  Skin: No rash noted.  Psychiatric: He has a normal mood and affect. His behavior is normal. Judgment and thought content normal.          Assessment & Plan:  Paresthesias feet much better Jerrye Bushy and IBS stable

## 2013-10-01 ENCOUNTER — Telehealth: Payer: Self-pay

## 2013-10-01 DIAGNOSIS — G609 Hereditary and idiopathic neuropathy, unspecified: Secondary | ICD-10-CM

## 2013-10-01 DIAGNOSIS — H353290 Exudative age-related macular degeneration, unspecified eye, stage unspecified: Secondary | ICD-10-CM | POA: Diagnosis not present

## 2013-10-01 NOTE — Telephone Encounter (Signed)
Patient requesting a prescription for medical stockings for him to get at a medical supply store. Patients call back number is [phone]

## 2013-10-02 NOTE — Telephone Encounter (Signed)
Patient has peripheral neuropathy- order created and signed by Dr. Ouida Sills.  Pt notified to pick up in drawer.

## 2013-11-05 ENCOUNTER — Encounter: Payer: Self-pay | Admitting: Podiatry

## 2013-11-05 ENCOUNTER — Ambulatory Visit (INDEPENDENT_AMBULATORY_CARE_PROVIDER_SITE_OTHER): Payer: Medicare Other

## 2013-11-05 ENCOUNTER — Ambulatory Visit (INDEPENDENT_AMBULATORY_CARE_PROVIDER_SITE_OTHER): Payer: Medicare Other | Admitting: Podiatry

## 2013-11-05 VITALS — BP 121/63 | HR 77 | Resp 16 | Ht 73.0 in | Wt 171.0 lb

## 2013-11-05 DIAGNOSIS — G629 Polyneuropathy, unspecified: Secondary | ICD-10-CM

## 2013-11-05 DIAGNOSIS — M722 Plantar fascial fibromatosis: Secondary | ICD-10-CM | POA: Diagnosis not present

## 2013-11-05 MED ORDER — GABAPENTIN 300 MG PO CAPS: 300.0000 mg | ORAL_CAPSULE | Freq: Two times a day (BID) | ORAL | Status: DC

## 2013-11-05 NOTE — Progress Notes (Signed)
   Subjective:    Patient ID: Chad [name], male    DOB: [birth year], 80 y.o.   MRN: [id number]  HPI Comments: For a couple of months now my feet have been tingling and burning. Its worse at night. i have been in and out of the hospital for a few months now , i do not know what is going on   Foot Pain      Review of Systems  All other systems reviewed and are negative.      Objective:   Physical Exam: I have reviewed his past mental history medications allergies surgery social history. Pulses are palpable bilateral. Neurologic sensorium is intact per Semmes-Weinstein monofilament. Degenerative reflexes are intact muscle strength is 5 over 5 dorsiflexion flexors and inverters everters on physical musculature is intact. Orthopedic evaluation doesn't straight hammertoe deformities bilateral.        Assessment & Plan:  Assessment: Idiopathic neuropathy.  Plan: Increase his gabapentin from 300 daily at bedtime to 300 twice a day. I will follow-up with him in 1 month.

## 2013-11-12 DIAGNOSIS — H3532 Exudative age-related macular degeneration: Secondary | ICD-10-CM | POA: Diagnosis not present

## 2013-11-13 NOTE — Progress Notes (Addendum)
HPI: FU atrial tachycardia and atrial flutter. Patient presented in April of 2014 with dizziness and noted to be in atrial flutter. Cardiac catheterization on 05/06/12 revealed normal coronary arteries. Carotid Dopplers April 2014 showed no significant stenosis. TSH normal. Patient had atrial flutter ablation on 07/08/2012. Echo 1/15 showed normal LV function, calcified AOV (visually moderate AS but mean gradient 8 mmHg), biatrial enlargement. Admitted with atrial tachycardia 1/15. Declined ablation. Treated with short acting cardizem as he had evidence of tachy brady. Since last seen, He denies dyspnea, chest pain, palpitations or syncope.  Current Outpatient Prescriptions  Medication Sig Dispense Refill  . calcium carbonate (OS-CAL - DOSED IN MG OF ELEMENTAL CALCIUM) 1250 MG tablet Take 1 tablet by mouth daily with breakfast.    . Cholecalciferol (RA VITAMIN D-3) 2000 UNITS CAPS Take 2,000 Units by mouth daily.     [name] diltiazem (CARDIZEM) 30 MG tablet Take 1 tablet (30 mg total) by mouth every 6 (six) hours as needed. As needed for palpitations. 30 tablet 4  . gabapentin (NEURONTIN) 300 MG capsule Take 1 capsule (300 mg total) by mouth 2 (two) times daily. 60 capsule 3  . omeprazole (PRILOSEC) 20 MG capsule Take 1 capsule (20 mg total) by mouth daily. 90 capsule 1   No current facility-administered medications for this visit.     Past Medical History  Diagnosis Date  . Hypertension   . Colon polyps   . Diverticulosis of colon (without mention of hemorrhage)   . Internal and external hemorrhoids without complication   . Gastritis, chronic     Pt denies bleeding 04/2012. EGD 2012 reportedly normal.  . COPD (chronic obstructive pulmonary disease)   . IBS (irritable bowel syndrome)   . 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 Hyponatremia     04/2012 r/t diuretic.  [name] Abnormal EKG     ST changes with normal coronaries by cath 05/06/12.  . Carotid artery calcification     By CXR - dopplers 05/06/12  without obvious evidence of significant ICA stenosis >40%  . GERD (gastroesophageal reflux disease)   . Atrial flutter     a. Slow ~100bpm when in 2:1; dx 04/2012. b. Placed on Xarelto.  c. s/p ablation 07-08-2012 by Dr Rayann Heman  . Atrial fibrillation   . Pneumonia     "couple times" (01/12/2013)  . Exertional shortness of breath   . Arthritis     "hands" (01/12/2013)  . Atrial tachycardia     Past Surgical History  Procedure Laterality Date  . 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 Trigger finger release Right 1970's    "2 fingers"  . Hemorrhoid surgery  1968  . Colonoscopy  08/29/10    diverticulosis, internal hemorrhoids  . Esophagogastroduodenoscopy  09/20/10    normal  . Atrial flutter ablation  07-08-2012    of typical atrial flutter by Dr Rayann Heman  . Cardiac catheterization  04/2012    History   Social History  . Marital Status: Divorced    Spouse Name: N/A    Number of Children: N/A  . Years of Education: N/A   Occupational History  . Retired    Social History Main Topics  . Smoking status: Former Smoker -- 38 years    Types: Cigarettes, Pipe    Quit date: 01/09/1996  . Smokeless tobacco: Never Used     Comment:    . Alcohol Use: 0.0 oz/week    0 Not specified per week  Comment: 01/12/2013 "did drink 2-3 12 oz beers a day; stopped drinking in 04/2012"  . Drug Use: No  . Sexual Activity: No   Other Topics Concern  . Not on file   Social History Narrative   Lives in [place] by himself    ROS: no fevers or chills, productive cough, hemoptysis, dysphasia, odynophagia, melena, hematochezia, dysuria, hematuria, rash, seizure activity, orthopnea, PND, pedal edema, claudication. Remaining systems are negative.  Physical Exam: Well-developed well-nourished in no acute distress.  Skin is warm and dry.  HEENT is normal.  Neck is supple.  Chest is clear to auscultation with normal expansion.  Cardiovascular exam is regular rate and rhythm. 2/6 systolic  murmur left sternal border. Abdominal exam nontender or distended. No masses palpated. Extremities show no edema. neuro grossly intact  ECG Sinus rhythm with first-degree AV block. Left anterior fascicular block. Cannot rule out prior septal infarct.

## 2013-11-16 ENCOUNTER — Ambulatory Visit (INDEPENDENT_AMBULATORY_CARE_PROVIDER_SITE_OTHER): Payer: Medicare Other | Admitting: Cardiology

## 2013-11-16 ENCOUNTER — Encounter: Payer: Self-pay | Admitting: Cardiology

## 2013-11-16 VITALS — BP 142/88 | HR 66 | Ht 73.0 in | Wt 171.6 lb

## 2013-11-16 DIAGNOSIS — I4892 Unspecified atrial flutter: Secondary | ICD-10-CM

## 2013-11-16 DIAGNOSIS — I498 Other specified cardiac arrhythmias: Secondary | ICD-10-CM | POA: Diagnosis not present

## 2013-11-16 DIAGNOSIS — I471 Supraventricular tachycardia: Secondary | ICD-10-CM

## 2013-11-16 DIAGNOSIS — I10 Essential (primary) hypertension: Secondary | ICD-10-CM | POA: Diagnosis not present

## 2013-11-16 DIAGNOSIS — I350 Nonrheumatic aortic (valve) stenosis: Secondary | ICD-10-CM

## 2013-11-16 DIAGNOSIS — I4719 Other supraventricular tachycardia: Secondary | ICD-10-CM

## 2013-11-16 NOTE — Assessment & Plan Note (Signed)
Repeat echocardiogram when he returns in 1 year.

## 2013-11-16 NOTE — Assessment & Plan Note (Signed)
No recurrent episodes. Continue regular Cardizem as needed. Not on daily long-acting Cardizem given history of tachybradycardia syndrome. We would like to avoid pacemaker if possible. Consider ablation if he has more frequent episodes in the future.

## 2013-11-16 NOTE — Patient Instructions (Signed)
Your physician wants you to follow-up in: ONE YEAR WITH DR CRENSHAW You will receive a reminder letter in the mail two months in advance. If you don't receive a letter, please call our office to schedule the follow-up appointment.  

## 2013-11-16 NOTE — Assessment & Plan Note (Signed)
Status post ablation. 

## 2013-11-16 NOTE — Assessment & Plan Note (Signed)
Blood pressure is borderline but he follows this at home and it is typically controlled. Continue present medications.

## 2013-12-10 ENCOUNTER — Ambulatory Visit (INDEPENDENT_AMBULATORY_CARE_PROVIDER_SITE_OTHER): Payer: Medicare Other | Admitting: Podiatry

## 2013-12-10 VITALS — BP 120/72 | HR 71 | Resp 16

## 2013-12-10 DIAGNOSIS — G629 Polyneuropathy, unspecified: Secondary | ICD-10-CM | POA: Diagnosis not present

## 2013-12-10 MED ORDER — GABAPENTIN 300 MG PO CAPS: ORAL_CAPSULE | ORAL | Status: DC

## 2013-12-10 NOTE — Progress Notes (Signed)
He presents today for follow-up of his neuropathy. He states that I have noticed a little change. He has been taking his gabapentin 300 mg 1 by mouth twice daily.  Objective: No change in physical exam. Pulses are palpable bilateral.  Assessment: Idiopathic neuropathy bilateral.  Plan: Increased his gabapentin 600 mg at bedtime and continue 300 mg during the day. Follow up with him in one month to reevaluate medication.

## 2013-12-15 ENCOUNTER — Telehealth: Payer: Self-pay | Admitting: Family Medicine

## 2013-12-15 NOTE — Telephone Encounter (Signed)
Patient received the flu shot at the [place] [facility] on 12/07/13.

## 2013-12-17 ENCOUNTER — Encounter (HOSPITAL_COMMUNITY): Payer: Self-pay | Admitting: Cardiology

## 2014-01-12 DIAGNOSIS — H43813 Vitreous degeneration, bilateral: Secondary | ICD-10-CM | POA: Diagnosis not present

## 2014-01-12 DIAGNOSIS — H3532 Exudative age-related macular degeneration: Secondary | ICD-10-CM | POA: Diagnosis not present

## 2014-01-12 DIAGNOSIS — H3531 Nonexudative age-related macular degeneration: Secondary | ICD-10-CM | POA: Diagnosis not present

## 2014-01-27 DIAGNOSIS — H2513 Age-related nuclear cataract, bilateral: Secondary | ICD-10-CM | POA: Diagnosis not present

## 2014-01-27 DIAGNOSIS — H40013 Open angle with borderline findings, low risk, bilateral: Secondary | ICD-10-CM | POA: Diagnosis not present

## 2014-02-05 ENCOUNTER — Telehealth: Payer: Self-pay | Admitting: *Deleted

## 2014-02-05 MED ORDER — GABAPENTIN 300 MG PO CAPS: ORAL_CAPSULE | ORAL | Status: DC

## 2014-02-05 NOTE — Telephone Encounter (Signed)
Pt states the rx for Gabapentin 300mg  one tablet in the morning and 2 tablets at Bedtime, was not at the pharmacy.  I reviewed our medication documentation and in was marked as received by the [facility] on 12/10/2013.  I will refill as Dr. Milinda Pointer ordered on 12/10/2013.

## 2014-02-09 ENCOUNTER — Ambulatory Visit: Payer: Medicare Other | Admitting: Podiatry

## 2014-03-23 DIAGNOSIS — H3532 Exudative age-related macular degeneration: Secondary | ICD-10-CM | POA: Diagnosis not present

## 2014-04-20 DIAGNOSIS — H2512 Age-related nuclear cataract, left eye: Secondary | ICD-10-CM | POA: Diagnosis not present

## 2014-04-20 DIAGNOSIS — H25812 Combined forms of age-related cataract, left eye: Secondary | ICD-10-CM | POA: Diagnosis not present

## 2014-04-28 ENCOUNTER — Ambulatory Visit (INDEPENDENT_AMBULATORY_CARE_PROVIDER_SITE_OTHER): Payer: Medicare Other | Admitting: Family Medicine

## 2014-04-28 ENCOUNTER — Encounter: Payer: Self-pay | Admitting: Family Medicine

## 2014-04-28 VITALS — BP 125/75 | HR 93 | Temp 98.2°F | Resp 16 | Ht 73.0 in | Wt 182.4 lb

## 2014-04-28 DIAGNOSIS — Z0000 Encounter for general adult medical examination without abnormal findings: Secondary | ICD-10-CM

## 2014-04-28 DIAGNOSIS — I498 Other specified cardiac arrhythmias: Secondary | ICD-10-CM | POA: Diagnosis not present

## 2014-04-28 DIAGNOSIS — K2100 Gastro-esophageal reflux disease with esophagitis, without bleeding: Secondary | ICD-10-CM

## 2014-04-28 DIAGNOSIS — I10 Essential (primary) hypertension: Secondary | ICD-10-CM | POA: Diagnosis not present

## 2014-04-28 DIAGNOSIS — I4892 Unspecified atrial flutter: Secondary | ICD-10-CM

## 2014-04-28 DIAGNOSIS — Z23 Encounter for immunization: Secondary | ICD-10-CM | POA: Diagnosis not present

## 2014-04-28 DIAGNOSIS — I471 Supraventricular tachycardia: Secondary | ICD-10-CM | POA: Diagnosis not present

## 2014-04-28 DIAGNOSIS — I872 Venous insufficiency (chronic) (peripheral): Secondary | ICD-10-CM | POA: Diagnosis not present

## 2014-04-28 DIAGNOSIS — R202 Paresthesia of skin: Secondary | ICD-10-CM | POA: Diagnosis not present

## 2014-04-28 LAB — CBC WITH DIFFERENTIAL/PLATELET
Basophils Absolute: 0.1 10*3/uL (ref 0.0–0.1)
Basophils Relative: 1 % (ref 0–1)
Eosinophils Absolute: 0.4 10*3/uL (ref 0.0–0.7)
Eosinophils Relative: 5 % (ref 0–5)
HCT: 37.5 % — ABNORMAL LOW (ref 39.0–52.0)
Hemoglobin: 12.3 g/dL — ABNORMAL LOW (ref 13.0–17.0)
Lymphocytes Relative: 23 % (ref 12–46)
Lymphs Abs: 1.7 10*3/uL (ref 0.7–4.0)
MCH: 26.9 pg (ref 26.0–34.0)
MCHC: 32.8 g/dL (ref 30.0–36.0)
MCV: 81.9 fL (ref 78.0–100.0)
MONO ABS: 1 10*3/uL (ref 0.1–1.0)
MPV: 10.6 fL (ref 8.6–12.4)
Monocytes Relative: 13 % — ABNORMAL HIGH (ref 3–12)
NEUTROS ABS: 4.4 10*3/uL (ref 1.7–7.7)
NEUTROS PCT: 58 % (ref 43–77)
PLATELETS: 236 10*3/uL (ref 150–400)
RBC: 4.58 MIL/uL (ref 4.22–5.81)
RDW: 14.8 % (ref 11.5–15.5)
WBC: 7.5 10*3/uL (ref 4.0–10.5)

## 2014-04-28 LAB — COMPREHENSIVE METABOLIC PANEL
ALK PHOS: 64 U/L (ref 39–117)
ALT: 12 U/L (ref 0–53)
AST: 21 U/L (ref 0–37)
Albumin: 3.7 g/dL (ref 3.5–5.2)
BUN: 19 mg/dL (ref 6–23)
CO2: 27 meq/L (ref 19–32)
Calcium: 9.4 mg/dL (ref 8.4–10.5)
Chloride: 101 mEq/L (ref 96–112)
Creat: 1.23 mg/dL (ref 0.50–1.35)
Glucose, Bld: 86 mg/dL (ref 70–99)
POTASSIUM: 4.6 meq/L (ref 3.5–5.3)
SODIUM: 141 meq/L (ref 135–145)
TOTAL PROTEIN: 6.7 g/dL (ref 6.0–8.3)
Total Bilirubin: 0.5 mg/dL (ref 0.2–1.2)

## 2014-04-28 LAB — POCT URINALYSIS DIPSTICK
Bilirubin, UA: NEGATIVE
Glucose, UA: NEGATIVE
Ketones, UA: NEGATIVE
Nitrite, UA: NEGATIVE
PROTEIN UA: NEGATIVE
RBC UA: NEGATIVE
SPEC GRAV UA: 1.015
Urobilinogen, UA: 0.2
pH, UA: 7

## 2014-04-28 MED ORDER — OMEPRAZOLE 20 MG PO CPDR: 20.0000 mg | DELAYED_RELEASE_CAPSULE | Freq: Every day | ORAL | Status: DC

## 2014-04-28 NOTE — Patient Instructions (Signed)

## 2014-04-28 NOTE — Progress Notes (Signed)
Subjective:    Patient ID: Chad [name], male    DOB: 1931-05-10, 81 y.o.   MRN: [id number]  HPI This 81 y.o. male presents for Annual Wellness Examination.  Last physical:  2015 Colonoscopy:  06-26-2005;8- 2012 TDAP:  2010 Pneumovax:  2010;  Zostavax: never Influenza:  12-07-13 Eye exam:  S/p cataract surgery last week; +glasses.   Shot in R eye every six to eight weeks.  Lymus. Dental exam:  [facility]; last week.    B feet pain: s/p podiatry consultation; awakens at 5:00am due to feet pain despite one tablet in morning and 2 in evening of Gabapentin. No n/t or burning. Just pain.  More pain at night than in day.  Minimal pain during the day.  Takes Gabapentin 11:00pm.  Takes one every morning as well but does not have daytime pain. Pain is located to B MTP regions.  Anxiety: takes Librium rarely.  Just filled 90 tablets on 03/27/14 from outside prescriber.  GERD: takes Prilosec daily.  Denies n/v/d/c; denies bloody stools or melena.  Denies abdominal pain.   Dr. Elder Cyphers stopped ASA due to stomach issues.     Review of Systems  Constitutional: Negative.  Negative for fever, chills, diaphoresis, activity change, appetite change, fatigue and unexpected weight change.  HENT: Positive for dental problem. Negative for congestion, drooling, ear discharge, ear pain, facial swelling, hearing loss, mouth sores, nosebleeds, postnasal drip, rhinorrhea, sinus pressure, sneezing, sore throat, tinnitus, trouble swallowing and voice change.   Eyes: Positive for photophobia. Negative for pain, discharge, redness, itching and visual disturbance.  Respiratory: Negative.  Negative for apnea, cough, choking, chest tightness, shortness of breath, wheezing and stridor.   Cardiovascular: Positive for palpitations and leg swelling. Negative for chest pain.  Gastrointestinal: Negative.  Negative for nausea, vomiting, abdominal pain, diarrhea, constipation and blood in stool.  Endocrine: Negative.  Negative for  cold intolerance, heat intolerance, polydipsia, polyphagia and polyuria.  Genitourinary: Negative.  Negative for dysuria, urgency, frequency, hematuria, flank pain, decreased urine volume, discharge, penile swelling, scrotal swelling, enuresis, difficulty urinating, genital sores, penile pain and testicular pain.  Musculoskeletal: Positive for arthralgias. Negative for myalgias, back pain, joint swelling, gait problem, neck pain and neck stiffness.  Skin: Negative.  Negative for color change, pallor, rash and wound.  Allergic/Immunologic: Negative.  Negative for environmental allergies, food allergies and immunocompromised state.  Neurological: Negative.  Negative for dizziness, tremors, seizures, syncope, facial asymmetry, speech difficulty, weakness, light-headedness, numbness and headaches.  Hematological: Negative.  Negative for adenopathy. Does not bruise/bleed easily.  Psychiatric/Behavioral: Negative for suicidal ideas, hallucinations, behavioral problems, confusion, sleep disturbance, self-injury, dysphoric mood, decreased concentration and agitation. The patient is nervous/anxious. The patient is not hyperactive.    Past Medical History  Diagnosis Date  . Hypertension   . Colon polyps   . Diverticulosis of colon (without mention of hemorrhage)   . Internal and external hemorrhoids without complication   . Gastritis, chronic     Pt denies bleeding 04/2012. EGD 2012 reportedly normal.  . COPD (chronic obstructive pulmonary disease)   . IBS (irritable bowel syndrome)   . 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 Hyponatremia     04/2012 r/t diuretic.  [name] Abnormal EKG     ST changes with normal coronaries by cath 05/06/12.  . Carotid artery calcification     By CXR - dopplers 05/06/12 without obvious evidence of significant ICA stenosis >40%  . GERD (gastroesophageal reflux disease)   . Atrial flutter     a.  Slow ~100bpm when in 2:1; dx 04/2012. b. Placed on Xarelto.  c. s/p ablation 07-08-2012 by Dr Rayann Heman    . Atrial fibrillation   . Pneumonia     "couple times" (01/12/2013)  . Exertional shortness of breath   . Atrial tachycardia   . Arthritis     "hands" (01/12/2013); feet (podiatry consultation)   Past Surgical History  Procedure Laterality Date  . 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 Trigger finger release Right 1970's    "2 fingers"  . Hemorrhoid surgery  1968  . Colonoscopy  08/29/10    diverticulosis, internal hemorrhoids  . Esophagogastroduodenoscopy  09/20/10    normal  . Atrial flutter ablation  07-08-2012    of typical atrial flutter by Dr Rayann Heman  . Cardiac catheterization  04/2012  . Left heart catheterization with coronary angiogram N/A 05/06/2012    Procedure: LEFT HEART CATHETERIZATION WITH CORONARY ANGIOGRAM;  Surgeon: Peter M Martinique, MD;  Location: [facility] CATH LAB;  Service: Cardiovascular;  Laterality: N/A;  . Atrial flutter ablation N/A 07/08/2012    Procedure: ATRIAL FLUTTER ABLATION;  Surgeon: Thompson Grayer, MD;  Location: [facility] CATH LAB;  Service: Cardiovascular;  Laterality: N/A;   Allergies  Allergen Reactions  . Dyazide [Hydrochlorothiazide W-Triamterene]     Caused hyponatremia 04/2012   History   Social History  . Marital Status: Divorced    Spouse Name: N/A  . Number of Children: N/A  . Years of Education: N/A   Occupational History  . Retired    Social History Main Topics  . Smoking status: Former Smoker -- 38 years    Types: Cigarettes, Pipe    Quit date: 01/09/1996  . Smokeless tobacco: Never Used     Comment:    . Alcohol Use: 0.0 oz/week    0 Standard drinks or equivalent per week     Comment: 01/12/2013 "did drink 2-3 12 oz beers a day; stopped drinking in 04/2012"  . Drug Use: No  . Sexual Activity: No   Other Topics Concern  . Not on file   Social History Narrative   Marital status: divorced; lives with ex-wife      Children:  2 sons, 2 grandsons, 1 granddaughter; no gg.      Lives: with ex-wife in house.      Employment:  retired first time age 7; retired in 1997.  Barista x 25 years; [employer].      Tobacco: quit smoking 1997; smoked for 27 years.      Alcohol: quit 2004      Exercise:  Walking daily short distances.      ADLs:  NO assistant devices; has cane if needs it; drives; pays bill; grocery shopping; wife cleans house and does laundry.     Advanced Directives:  +LIVING WILL; +FULL CODE      Family History  Problem Relation Age of Onset  . Prostate cancer Brother   . Diabetes    . Heart disease Brother     details unclear       Objective:   Physical Exam  Constitutional: He is oriented to person, place, and time. He appears well-developed and well-nourished. No distress.  HENT:  Head: Normocephalic and atraumatic.  Right Ear: External ear normal.  Left Ear: External ear normal.  Nose: Nose normal.  Mouth/Throat: Oropharynx is clear and moist.  Eyes: Conjunctivae and EOM are normal. Pupils are equal, round, and reactive to light.  Neck:  Normal range of motion. Neck supple. Carotid bruit is not present. No thyromegaly present.  Cardiovascular: Normal rate, regular rhythm and normal heart sounds.  Exam reveals no gallop and no friction rub.   No murmur heard. 2+ pitting edema to lower ankles B.   Decreased pulses B feet.  Feet are warm; capillary refill < 3 seconds.  Pulmonary/Chest: Effort normal and breath sounds normal. He has no wheezes. He has no rales.  Abdominal: Soft. Bowel sounds are normal. He exhibits no distension and no mass. There is no tenderness. There is no rebound and no guarding. Hernia confirmed negative in the right inguinal area and confirmed negative in the left inguinal area.  Genitourinary: Rectum normal, prostate normal, testes normal and penis normal. Prostate is not enlarged and not tender. Right testis shows no mass, no swelling and no tenderness. Right testis is descended. Left testis shows no mass, no swelling and no tenderness. Left testis is  descended.  Musculoskeletal:       Right shoulder: Normal.       Left shoulder: Normal.       Cervical back: Normal.  Lymphadenopathy:    He has no cervical adenopathy.       Right: No inguinal adenopathy present.       Left: No inguinal adenopathy present.  Neurological: He is alert and oriented to person, place, and time. He has normal reflexes. No cranial nerve deficit. He exhibits normal muscle tone. Coordination normal.  Skin: Skin is warm and dry. No rash noted. He is not diaphoretic.  Psychiatric: He has a normal mood and affect. His behavior is normal. Judgment and thought content normal.   PREVNAR 13 ADMINISTERED.    Assessment & Plan:   1. Encounter for Medicare annual wellness exam   2. Essential hypertension   3. Ectopic atrial tachycardia   4. Atrial flutter, unspecified   5. Gastroesophageal reflux disease with esophagitis   6. Paresthesias   7. Need for pneumococcal vaccine   8. Venous insufficiency     1.  Annual Medicare Wellness Exam: anticipatory guidance --- exercise, weight maintenance.  No evidence of depression.  Moderate fall risk due to feet pain.  Independent with ADLs. No hearing loss.  +advanced directives; desires FULL CODE but no prolonged measures.  Immunizations reviewed; refused Zostavax; s/p Prevnar 13.  Colonoscopy UTD.  No longer warrants PSA. 2.  GERD: stable; refill of Prilosec provided.  3.  Atrial flutter/ectopic atrial tachycardia: stable; using Cardizem PRN only; followed by cardiology annually; last visit 11/16/2013 with [facility]. 4.  B feet pain/neuropathy: uncontrolled; nighttime symptoms only; recommend changing Gabapentin to 900mg  qhs.  S/p podiatry evaluation.  If no improvement with Gapapentin 900mg  qhs, refer to neurology for NCS and refer to cardiology for arterial duplex. Pain isolated to first MTP regions. 5. S/p Prevnar 13. 6. Anxiety: stable; intermittent; using Librium sparingly. 7. Venous insufficiency: chronic for past twenty  years; denies associated chest pain or orthopnea/DOE.     Meds ordered this encounter  Medications  . Omega-3 Fatty Acids (FISH OIL PO)    Sig: Take by mouth.  . beta carotene w/minerals (OCUVITE) tablet    Sig: Take 1 tablet by mouth daily.  . chlordiazePOXIDE (LIBRIUM) 10 MG capsule    Sig: Take 10 mg by mouth 3 (three) times daily as needed for anxiety.  [name] omeprazole (PRILOSEC) 20 MG capsule    Sig: Take 1 capsule (20 mg total) by mouth daily.    Dispense:  90 capsule  Refill:  3    Kristi Elayne Guerin, M.D. [facility] [address] [place], [place]  [address] [phone] phone [phone] fax

## 2014-05-07 ENCOUNTER — Telehealth: Payer: Self-pay

## 2014-05-07 NOTE — Telephone Encounter (Signed)
Patient received a call about his lab results and he was told he may be anemic. He would like someone to call him back and clarify results. Also states he usually gets a letter in the mail from the lab and would like this also. Cb# [phone].

## 2014-05-08 NOTE — Telephone Encounter (Signed)
Spoke with patient explained labs to him patient understands

## 2014-05-18 DIAGNOSIS — H3532 Exudative age-related macular degeneration: Secondary | ICD-10-CM | POA: Diagnosis not present

## 2014-05-25 ENCOUNTER — Telehealth: Payer: Self-pay | Admitting: Cardiology

## 2014-05-25 NOTE — Telephone Encounter (Signed)
Pt called in stating that he went to his PCP on 4/20 and she informed him that he had a heart murmur and to follow up with Dr. Stanford Breed. Today he calls wanting to get an appt with Dr.  Stanford Breed as soon as possible. Please advise pt...  Thanks

## 2014-05-25 NOTE — Telephone Encounter (Signed)
Spoke with pt, he carries a diagnosis of aortic stenosis that is followed by echo's and that is probably the reason for the murmur. The patient denies any problems but he would like to see someone. He agrees to seeing an app. Will contact patient with appointment

## 2014-05-31 DIAGNOSIS — H43811 Vitreous degeneration, right eye: Secondary | ICD-10-CM | POA: Diagnosis not present

## 2014-05-31 DIAGNOSIS — Z961 Presence of intraocular lens: Secondary | ICD-10-CM | POA: Diagnosis not present

## 2014-06-08 NOTE — Telephone Encounter (Signed)
Are you still working on this ?

## 2014-06-10 NOTE — Telephone Encounter (Signed)
Spoke with pt, Follow up scheduled  

## 2014-06-14 ENCOUNTER — Other Ambulatory Visit: Payer: Self-pay | Admitting: Podiatry

## 2014-06-14 NOTE — Telephone Encounter (Signed)
Pt needs to be reevaluated yearly for long-term medication use.

## 2014-06-15 ENCOUNTER — Encounter: Payer: Self-pay | Admitting: Physician Assistant

## 2014-06-15 ENCOUNTER — Ambulatory Visit (INDEPENDENT_AMBULATORY_CARE_PROVIDER_SITE_OTHER): Payer: Medicare Other | Admitting: Physician Assistant

## 2014-06-15 VITALS — BP 182/84 | HR 91 | Ht 73.0 in | Wt 185.0 lb

## 2014-06-15 DIAGNOSIS — I350 Nonrheumatic aortic (valve) stenosis: Secondary | ICD-10-CM | POA: Diagnosis not present

## 2014-06-15 DIAGNOSIS — I10 Essential (primary) hypertension: Secondary | ICD-10-CM

## 2014-06-15 DIAGNOSIS — I471 Supraventricular tachycardia: Secondary | ICD-10-CM | POA: Diagnosis not present

## 2014-06-15 DIAGNOSIS — I4892 Unspecified atrial flutter: Secondary | ICD-10-CM | POA: Diagnosis not present

## 2014-06-15 MED ORDER — LISINOPRIL 2.5 MG PO TABS: 2.5000 mg | ORAL_TABLET | Freq: Every day | ORAL | Status: DC

## 2014-06-15 NOTE — Progress Notes (Signed)
Cardiology Office Note   Date:  06/15/2014   ID:  Chad [name], DOB May 24, 1931, MRN [id number]  PCP:  Chad Forts, MD  Cardiologist:  Dr. Stanford [name]  Chief Complaint: Shortness of breath    History of Present Illness: Chad [name] is a 81 y.o. male who presents for a low up of aortic stenosis. He saw his primary care who mentioned a heart murmur and he became quite anxious. Since then he complains of occasional dyspnea on exertion or feeling the need to take a deep breath. He says it is no different than when he saw Dr. Stanford [name] back in November. He denies any associated chest pain, palpitations, dizziness, or presyncope.  Patient has a history of atrial tachycardia and atrial flutter and underwent ablation on 07/08/2012. Cardiac catheterization 04/2012 showed normal coronary arteries, carotid Dopplers in 2014 no stenosis, 2-D echo 01/2013 normal LV function calcified aortic valve with moderate left ear but mean gradient of 8 mmHg. He was admitted with atrial tachycardia in 01/2013 but declined ablation. He takes short acting Cardizem as needed for tachycardia. Sent has not had to take that in a long time.  Patient is quite anxious today and his blood pressure is elevated. It was normal and his primary care's in April. Even after he was here for a while his blood pressure remains 180/90.    Past Medical History  Diagnosis Date  . Hypertension   . Colon polyps   . Diverticulosis of colon (without mention of hemorrhage)   . Internal and external hemorrhoids without complication   . Gastritis, chronic     Pt denies bleeding 04/2012. EGD 2012 reportedly normal.  . COPD (chronic obstructive pulmonary disease)   . IBS (irritable bowel syndrome)   . 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 Hyponatremia     04/2012 r/t diuretic.  Chad [name] Abnormal EKG     ST changes with normal coronaries by cath 05/06/12.  . Carotid artery calcification     By CXR - dopplers 05/06/12 without obvious evidence of significant ICA  stenosis >40%  . GERD (gastroesophageal reflux disease)   . Atrial flutter     a. Slow ~100bpm when in 2:1; dx 04/2012. b. Placed on Xarelto.  c. s/p ablation 07-08-2012 by Dr Rayann Heman  . Atrial fibrillation   . Pneumonia     "couple times" (01/12/2013)  . Exertional shortness of breath   . Atrial tachycardia   . Arthritis     "hands" (01/12/2013); feet (podiatry consultation)    Past Surgical History  Procedure Laterality Date  . 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 Trigger finger release Right 1970's    "2 fingers"  . Hemorrhoid surgery  1968  . Colonoscopy  08/29/10    diverticulosis, internal hemorrhoids  . Esophagogastroduodenoscopy  09/20/10    normal  . Atrial flutter ablation  07-08-2012    of typical atrial flutter by Dr Rayann Heman  . Cardiac catheterization  04/2012  . Left heart catheterization with coronary angiogram N/A 05/06/2012    Procedure: LEFT HEART CATHETERIZATION WITH CORONARY ANGIOGRAM;  Surgeon: Peter M Martinique, MD;  Location: [facility] CATH LAB;  Service: Cardiovascular;  Laterality: N/A;  . Atrial flutter ablation N/A 07/08/2012    Procedure: ATRIAL FLUTTER ABLATION;  Surgeon: Thompson Grayer, MD;  Location: [facility] CATH LAB;  Service: Cardiovascular;  Laterality: N/A;     Current Outpatient Prescriptions  Medication Sig Dispense Refill  . beta carotene w/minerals (OCUVITE) tablet Take 1 tablet by mouth  daily.    . calcium carbonate (OS-CAL - DOSED IN MG OF ELEMENTAL CALCIUM) 1250 MG tablet Take 1 tablet by mouth daily with breakfast.    . chlordiazePOXIDE (LIBRIUM) 10 MG capsule Take 10 mg by mouth 3 (three) times daily as needed for anxiety.    . Cholecalciferol (RA VITAMIN D-3) 2000 UNITS CAPS Take 2,000 Units by mouth daily.     Chad [name] diltiazem (CARDIZEM) 30 MG tablet Take 1 tablet (30 mg total) by mouth every 6 (six) hours as needed. As needed for palpitations. 30 tablet 4  . gabapentin (NEURONTIN) 300 MG capsule TAKE ONE BY MOUTH IN THE MORNING AND TWO BY MOUTH AT  BED TIME. 90 capsule 5  . Omega-3 Fatty Acids (FISH OIL PO) Take 1 capsule by mouth daily.     Chad [name] omeprazole (PRILOSEC) 20 MG capsule Take 1 capsule (20 mg total) by mouth daily. 90 capsule 3   No current facility-administered medications for this visit.    Allergies:   Dyazide    Social History:  The patient  reports that he quit smoking about 18 years ago. His smoking use included Cigarettes and Pipe. He quit after 38 years of use. He has never used smokeless tobacco. He reports that he drinks alcohol. He reports that he does not use illicit drugs.   Family History:  The patient's family history includes Diabetes in an other family member; Emphysema in his sister and sister; Heart disease in his brother; Other in his brother, father, and mother; Throat cancer in his brother.    ROS:  Please see the history of present illness.   Otherwise, review of systems are positive for none.   All other systems are reviewed and negative.    PHYSICAL EXAM: VS:  BP 182/84 mmHg  Pulse 91  Ht 6\' 1"  (1.854 m)  Wt 185 lb (83.915 kg)  BMI 24.41 kg/m2  SpO2 98% , BMI Body mass index is 24.41 kg/(m^2). GEN: Well nourished, well developed, in no acute distress Neck: no JVD, HJR, carotid bruits, or masses Cardiac:  ZYS;0/6 systolic murmur at the left sternal border, no gallop, rubs, thrill or heave,  Respiratory:  clear to auscultation bilaterally, normal work of breathing GI: soft, nontender, nondistended, + BS MS: no deformity or atrophy Extremities: without cyanosis, clubbing, edema, good distal pulses bilaterally.  Skin: warm and dry, no rash Neuro:  Strength and sensation are intact    EKG:  EKG is not ordered today.  Recent Labs: 04/28/2014: ALT 12; BUN 19; Creatinine 1.23; Hemoglobin 12.3*; Platelets 236; Potassium 4.6; Sodium 141    Lipid Panel    Component Value Date/Time   CHOL 169 01/13/2013 0605   TRIG 57 01/13/2013 0605   HDL 72 01/13/2013 0605   CHOLHDL 2.3 01/13/2013 0605    VLDL 11 01/13/2013 0605   LDLCALC 86 01/13/2013 0605      Wt Readings from Last 3 Encounters:  06/15/14 185 lb (83.915 kg)  04/28/14 182 lb 6.4 oz (82.736 kg)  11/16/13 171 lb 9.6 oz (77.837 kg)      Other studies Reviewed: Additional studies/ records that were reviewed today include and review of the records demonstrates: 2-D echo 01/2013 Study Conclusions  - Left ventricle: The cavity size was normal. Wall thickness   was increased in a pattern of mild LVH. Systolic function   was normal. The estimated ejection fraction was in the   range of 60% to 65%. Although no diagnostic regional wall   motion abnormality  was identified, this possibility cannot   be completely excluded on the basis of this study. Doppler   parameters are consistent with abnormal left ventricular   relaxation (grade 1 diastolic dysfunction). - Aortic valve: Trileaflet; severely calcified leaflets.   Visually there appears to be at least moderate aortic   stenosis, but mean gradient only measured as 8 mmHg. Valve   area: 1.84cm^2(VTI). Valve area: 1.7cm^2 (Vmax). - Mitral valve: Mildly calcified annulus. Moderately   calcified leaflets . No significant regurgitation. - Left atrium: The atrium was mildly to moderately dilated. - Right ventricle: The cavity size was normal. Systolic   function was normal. - Right atrium: The atrium was mildly dilated. - Tricuspid valve: Peak RV-RA gradient: 6mm Hg (S). - Pulmonary arteries: PA peak pressure: 83mm Hg (S). - Inferior vena cava: The vessel was normal in size; the   respirophasic diameter changes were in the normal range (=   50%); findings are consistent with normal central venous   pressure. - Pericardium, extracardiac: A trivial pericardial effusion   was identified posterior to the heart. Impressions:  - Normal LV size with mild LV hypertrophy, EF 60-65%. The   aortic valve was heavily calcified and is visually at   least moderately stenosed but mean  gradient only measured   8 mmHg. Normal RV size and systolic function. Would   consider either additional views of the aortic valve by   TTE or TEE if patient has symptoms suggestive of severe   AS.    ASSESSMENT AND PLAN: Aortic stenosis And has history of moderate aortic stenosis with mean gradient of 8 mmHg on echo in January 2015. Patient is quite anxious about his primary care hearing a murmur. He is due for another echo. We'll order this and have follow-up with Dr. Stanford [name]. He is having some symptoms of dyspnea on exertion but they have not changed much in the past 6 months.   Essential hypertension Blood pressure is quite high today even after he had been here for a little while. We'll add low-dose lisinopril 2.5 mg daily. Creatinine was 1.23 in April.   Atrial tachycardia Patient has not had many palpitations or use diltiazem recently.   Atrial flutter Status post ablation in 2014.      Signed, Ermalinda Barrios, PA-C  06/15/2014 1:02 PM    [facility] [place], [place], [place]  [address] Phone: [phone]; Fax: [phone]

## 2014-06-15 NOTE — Assessment & Plan Note (Signed)
And has history of moderate aortic stenosis with mean gradient of 8 mmHg on echo in January 2015. Patient is quite anxious about his primary care hearing a murmur. He is due for another echo. We'll order this and have follow-up with Dr. Stanford Breed. He is having some symptoms of dyspnea on exertion but they have not changed much in the past 6 months.

## 2014-06-15 NOTE — Assessment & Plan Note (Signed)
Status post ablation in 2014.

## 2014-06-15 NOTE — Assessment & Plan Note (Signed)
Blood pressure is quite high today even after he had been here for a little while. We'll add low-dose lisinopril 2.5 mg daily. Creatinine was 1.23 in April.

## 2014-06-15 NOTE — Assessment & Plan Note (Signed)
Patient has not had many palpitations or use diltiazem recently.

## 2014-06-15 NOTE — Patient Instructions (Addendum)
Medication Instructions:   START TAKING LISINOPRIL 2.5  MG ONCE A DAY   Labwork:   Testing/Procedures:  Your physician has requested that you have an echocardiogram. Echocardiography is a painless test that uses sound waves to create images of your heart. It provides your doctor with information about the size and shape of your heart and how well your heart's chambers and valves are working. This procedure takes approximately one hour. There are no restrictions for this procedure.   Follow-Up:  [facility] IN 3 MONTHS OR RECALL   Any Other Special Instructions Will Be Listed Below (If Applicable).

## 2014-06-17 ENCOUNTER — Ambulatory Visit (HOSPITAL_COMMUNITY): Payer: Medicare Other | Attending: Internal Medicine

## 2014-06-17 ENCOUNTER — Other Ambulatory Visit: Payer: Self-pay

## 2014-06-17 DIAGNOSIS — I10 Essential (primary) hypertension: Secondary | ICD-10-CM | POA: Diagnosis not present

## 2014-06-17 DIAGNOSIS — I4891 Unspecified atrial fibrillation: Secondary | ICD-10-CM | POA: Insufficient documentation

## 2014-06-17 DIAGNOSIS — J449 Chronic obstructive pulmonary disease, unspecified: Secondary | ICD-10-CM | POA: Diagnosis not present

## 2014-06-17 DIAGNOSIS — I350 Nonrheumatic aortic (valve) stenosis: Secondary | ICD-10-CM | POA: Diagnosis not present

## 2014-06-22 ENCOUNTER — Telehealth: Payer: Self-pay | Admitting: *Deleted

## 2014-06-22 ENCOUNTER — Other Ambulatory Visit: Payer: Self-pay | Admitting: *Deleted

## 2014-06-22 NOTE — Telephone Encounter (Signed)
-----   Message from Imogene Burn, PA-C sent at 06/18/2014  7:01 AM EDT ----- Normal LV function with only mild aortic stenosis. Not a significant change. F/u with Dr. Stanford Breed

## 2014-06-29 DIAGNOSIS — H40013 Open angle with borderline findings, low risk, bilateral: Secondary | ICD-10-CM | POA: Diagnosis not present

## 2014-06-29 DIAGNOSIS — H2511 Age-related nuclear cataract, right eye: Secondary | ICD-10-CM | POA: Diagnosis not present

## 2014-07-09 ENCOUNTER — Other Ambulatory Visit: Payer: Self-pay

## 2014-07-09 MED ORDER — LISINOPRIL 2.5 MG PO TABS: 2.5000 mg | ORAL_TABLET | Freq: Every day | ORAL | Status: DC

## 2014-07-15 DIAGNOSIS — H43813 Vitreous degeneration, bilateral: Secondary | ICD-10-CM | POA: Diagnosis not present

## 2014-07-15 DIAGNOSIS — H3562 Retinal hemorrhage, left eye: Secondary | ICD-10-CM | POA: Diagnosis not present

## 2014-07-15 DIAGNOSIS — H3532 Exudative age-related macular degeneration: Secondary | ICD-10-CM | POA: Diagnosis not present

## 2014-07-22 DIAGNOSIS — H3532 Exudative age-related macular degeneration: Secondary | ICD-10-CM | POA: Diagnosis not present

## 2014-08-11 ENCOUNTER — Other Ambulatory Visit: Payer: Self-pay

## 2014-08-11 MED ORDER — DILTIAZEM HCL 30 MG PO TABS: 30.0000 mg | ORAL_TABLET | Freq: Four times a day (QID) | ORAL | Status: DC | PRN

## 2014-08-13 ENCOUNTER — Telehealth: Payer: Self-pay | Admitting: Cardiology

## 2014-08-13 MED ORDER — DILTIAZEM HCL 30 MG PO TABS: 30.0000 mg | ORAL_TABLET | Freq: Four times a day (QID) | ORAL | Status: DC | PRN

## 2014-08-13 NOTE — Telephone Encounter (Signed)
Rx(s) sent to pharmacy electronically. Patient notified. 

## 2014-08-13 NOTE — Telephone Encounter (Signed)
°  1. Which medications need to be refilled? Diltiazem   2. Which pharmacy is medication to be sent to?[facility] on [address]   3. Do they need a 30 day or 90 day supply? 30  4. Would they like a call back once the medication has been sent to the pharmacy? Yes

## 2014-08-19 DIAGNOSIS — H3532 Exudative age-related macular degeneration: Secondary | ICD-10-CM | POA: Diagnosis not present

## 2014-09-09 NOTE — Progress Notes (Signed)
HPI: FU atrial tachycardia and aortic stenosis. Patient presented in April of 2014 with dizziness and noted to be in atrial flutter. Cardiac catheterization on 05/06/12 revealed normal coronary arteries. Carotid Dopplers April 2014 showed no significant stenosis. TSH normal. Patient had atrial flutter ablation on 07/08/2012. Echo 1/15 showed normal LV function, calcified AOV (visually moderate AS but mean gradient 8 mmHg), biatrial enlargement. Admitted with atrial tachycardia 1/15. Declined ablation. Treated with short acting cardizem as he had evidence of tachy brady. Echo repeated 6/16 and showed normal LV function, mild AS with mean gradient of 11 mmHg, mild LAE. Since last seen, he has mild dyspnea with more extreme activities. No orthopnea, PND, pedal edema or chest pain. No palpitations. He has had some lightheadedness when he is in the sun but otherwise no syncope.  Current Outpatient Prescriptions  Medication Sig Dispense Refill  . diltiazem (CARDIZEM) 30 MG tablet Take 1 tablet (30 mg total) by mouth every 6 (six) hours as needed. For palpitations. 30 tablet 2  . gabapentin (NEURONTIN) 300 MG capsule 300 mg at bedtime. Patient reports that he takes 3 tablets at night    . lisinopril (PRINIVIL,ZESTRIL) 2.5 MG tablet Take 1 tablet (2.5 mg total) by mouth daily. 90 tablet 1  . omeprazole (PRILOSEC) 20 MG capsule Take 1 capsule (20 mg total) by mouth daily. 90 capsule 3   No current facility-administered medications for this visit.     Past Medical History  Diagnosis Date  . Hypertension   . Colon polyps   . Diverticulosis of colon (without mention of hemorrhage)   . Internal and external hemorrhoids without complication   . Gastritis, chronic     Pt denies bleeding 04/2012. EGD 2012 reportedly normal.  . COPD (chronic obstructive pulmonary disease)   . IBS (irritable bowel syndrome)   . 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 Hyponatremia     04/2012 r/t diuretic.  [name] Abnormal EKG     ST  changes with normal coronaries by cath 05/06/12.  . Carotid artery calcification     By CXR - dopplers 05/06/12 without obvious evidence of significant ICA stenosis >40%  . GERD (gastroesophageal reflux disease)   . Atrial flutter     a. Slow ~100bpm when in 2:1; dx 04/2012. b. Placed on Xarelto.  c. s/p ablation 07-08-2012 by Dr Rayann Heman  . Pneumonia     "couple times" (01/12/2013)  . Atrial tachycardia   . Arthritis     "hands" (01/12/2013); feet (podiatry consultation)    Past Surgical History  Procedure Laterality Date  . 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 Trigger finger release Right 1970's    "2 fingers"  . Hemorrhoid surgery  1968  . Colonoscopy  08/29/10    diverticulosis, internal hemorrhoids  . Esophagogastroduodenoscopy  09/20/10    normal  . Atrial flutter ablation  07-08-2012    of typical atrial flutter by Dr Rayann Heman  . Cardiac catheterization  04/2012  . Left heart catheterization with coronary angiogram N/A 05/06/2012    Procedure: LEFT HEART CATHETERIZATION WITH CORONARY ANGIOGRAM;  Surgeon: Peter M Martinique, MD;  Location: [facility] CATH LAB;  Service: Cardiovascular;  Laterality: N/A;  . Atrial flutter ablation N/A 07/08/2012    Procedure: ATRIAL FLUTTER ABLATION;  Surgeon: Thompson Grayer, MD;  Location: [facility] CATH LAB;  Service: Cardiovascular;  Laterality: N/A;    Social History   Social History  . Marital Status: Divorced    Spouse Name:  N/A  . Number of Children: N/A  . Years of Education: N/A   Occupational History  . Retired    Social History Main Topics  . Smoking status: Former Smoker -- 38 years    Types: Cigarettes, Pipe    Quit date: 01/09/1996  . Smokeless tobacco: Never Used     Comment:    . Alcohol Use: 0.0 oz/week    0 Standard drinks or equivalent per week     Comment: 01/12/2013 "did drink 2-3 12 oz beers a day; stopped drinking in 04/2012"  . Drug Use: No  . Sexual Activity: No   Other Topics Concern  . Not on file   Social History  Narrative   Marital status: divorced; lives with ex-wife      Children:  2 sons, 2 grandsons, 1 granddaughter; no gg.      Lives: with ex-wife in house.      Employment: retired first time age 89; retired in 1997.  Barista x 25 years; Actor.      Tobacco: quit smoking 1997; smoked for 27 years.      Alcohol: quit 2004      Exercise:  Walking daily short distances.      ADLs:  NO assistant devices; has cane if needs it; drives; pays bill; grocery shopping; wife cleans house and does laundry.     Advanced Directives:  +LIVING WILL; +FULL CODE       ROS: some arthralgias and cough but no fevers or chills, hemoptysis, dysphasia, odynophagia, melena, hematochezia, dysuria, hematuria, rash, seizure activity, orthopnea, PND, pedal edema, claudication. Remaining systems are negative.  Physical Exam: Well-developed well-nourished in no acute distress.  Skin is warm and dry.  HEENT is normal.  Neck is supple.  Chest is clear to auscultation with normal expansion.  Cardiovascular exam is regular rate and rhythm. 2/6 systolic murmur left sternal border. Abdominal exam nontender or distended. No masses palpated. Extremities show no edema. neuro grossly intact  ECG sinus rhythm, first-degree AV block, left axis deviation, prior septal infarct cannot be excluded.

## 2014-09-10 ENCOUNTER — Ambulatory Visit (INDEPENDENT_AMBULATORY_CARE_PROVIDER_SITE_OTHER): Payer: Medicare Other | Admitting: Cardiology

## 2014-09-10 ENCOUNTER — Encounter: Payer: Self-pay | Admitting: Cardiology

## 2014-09-10 DIAGNOSIS — I4892 Unspecified atrial flutter: Secondary | ICD-10-CM | POA: Diagnosis not present

## 2014-09-10 MED ORDER — LISINOPRIL 10 MG PO TABS: 10.0000 mg | ORAL_TABLET | Freq: Every day | ORAL | Status: DC

## 2014-09-10 NOTE — Assessment & Plan Note (Signed)
Plan repeat echocardiogram when he returns in 1 year.

## 2014-09-10 NOTE — Assessment & Plan Note (Signed)
Status post ablation. 

## 2014-09-10 NOTE — Patient Instructions (Signed)
Your physician wants you to follow-up in: [place] will receive a reminder letter in the mail two months in advance. If you don't receive a letter, please call our office to schedule the follow-up appointment.  INCREASE LISINOPRIL TO 10 MG ONCE DAILY= 4 OF THE 2.5 MG TABLETS ONCE DAILY  Your physician recommends that you return for lab work in: [place]

## 2014-09-10 NOTE — Assessment & Plan Note (Signed)
No recurrent episodes. Continue regular Cardizem as needed. Not on daily long-acting Cardizem given history of tachybradycardia syndrome. We would like to avoid pacemaker if possible. Consider ablation if he has more frequent episodes in the future.

## 2014-09-10 NOTE — Assessment & Plan Note (Addendum)
Blood pressure elevated; increase lisinopril to 10 mg daily. Follow blood pressure at home and increase medications as needed. Check potassium and renal function in 1 week.

## 2014-09-16 DIAGNOSIS — I10 Essential (primary) hypertension: Secondary | ICD-10-CM | POA: Diagnosis not present

## 2014-09-17 LAB — BASIC METABOLIC PANEL
BUN: 16 mg/dL (ref 7–25)
CALCIUM: 8.7 mg/dL (ref 8.6–10.3)
CO2: 27 mmol/L (ref 20–31)
Chloride: 97 mmol/L — ABNORMAL LOW (ref 98–110)
Creat: 1.16 mg/dL — ABNORMAL HIGH (ref 0.70–1.11)
GLUCOSE: 95 mg/dL (ref 65–99)
Potassium: 4.9 mmol/L (ref 3.5–5.3)
SODIUM: 128 mmol/L — AB (ref 135–146)

## 2014-09-23 DIAGNOSIS — H3532 Exudative age-related macular degeneration: Secondary | ICD-10-CM | POA: Diagnosis not present

## 2014-10-26 DIAGNOSIS — H353222 Exudative age-related macular degeneration, left eye, with inactive choroidal neovascularization: Secondary | ICD-10-CM | POA: Diagnosis not present

## 2014-10-27 ENCOUNTER — Ambulatory Visit: Payer: Medicare Other | Admitting: Family Medicine

## 2014-10-29 ENCOUNTER — Ambulatory Visit (INDEPENDENT_AMBULATORY_CARE_PROVIDER_SITE_OTHER): Payer: Medicare Other | Admitting: Family Medicine

## 2014-10-29 ENCOUNTER — Encounter: Payer: Self-pay | Admitting: Family Medicine

## 2014-10-29 ENCOUNTER — Other Ambulatory Visit: Payer: Self-pay | Admitting: *Deleted

## 2014-10-29 VITALS — BP 160/90 | HR 97 | Temp 97.3°F | Resp 16 | Wt 179.3 lb

## 2014-10-29 DIAGNOSIS — E871 Hypo-osmolality and hyponatremia: Secondary | ICD-10-CM | POA: Diagnosis not present

## 2014-10-29 DIAGNOSIS — D649 Anemia, unspecified: Secondary | ICD-10-CM | POA: Diagnosis not present

## 2014-10-29 DIAGNOSIS — I498 Other specified cardiac arrhythmias: Secondary | ICD-10-CM | POA: Diagnosis not present

## 2014-10-29 DIAGNOSIS — I471 Supraventricular tachycardia: Secondary | ICD-10-CM

## 2014-10-29 DIAGNOSIS — I4892 Unspecified atrial flutter: Secondary | ICD-10-CM

## 2014-10-29 DIAGNOSIS — I10 Essential (primary) hypertension: Secondary | ICD-10-CM | POA: Diagnosis not present

## 2014-10-29 DIAGNOSIS — G609 Hereditary and idiopathic neuropathy, unspecified: Secondary | ICD-10-CM

## 2014-10-29 DIAGNOSIS — Z23 Encounter for immunization: Secondary | ICD-10-CM

## 2014-10-29 DIAGNOSIS — C140 Malignant neoplasm of pharynx, unspecified: Secondary | ICD-10-CM

## 2014-10-29 DIAGNOSIS — I350 Nonrheumatic aortic (valve) stenosis: Secondary | ICD-10-CM

## 2014-10-29 DIAGNOSIS — R05 Cough: Secondary | ICD-10-CM | POA: Diagnosis not present

## 2014-10-29 DIAGNOSIS — R059 Cough, unspecified: Secondary | ICD-10-CM

## 2014-10-29 LAB — COMPREHENSIVE METABOLIC PANEL
ALK PHOS: 59 U/L (ref 40–115)
ALT: 13 U/L (ref 9–46)
AST: 22 U/L (ref 10–35)
Albumin: 3.7 g/dL (ref 3.6–5.1)
BUN: 18 mg/dL (ref 7–25)
CALCIUM: 8.9 mg/dL (ref 8.6–10.3)
CO2: 27 mmol/L (ref 20–31)
Chloride: 97 mmol/L — ABNORMAL LOW (ref 98–110)
Creat: 1.28 mg/dL — ABNORMAL HIGH (ref 0.70–1.11)
Glucose, Bld: 94 mg/dL (ref 65–99)
POTASSIUM: 5.2 mmol/L (ref 3.5–5.3)
Sodium: 131 mmol/L — ABNORMAL LOW (ref 135–146)
TOTAL PROTEIN: 6.8 g/dL (ref 6.1–8.1)
Total Bilirubin: 0.5 mg/dL (ref 0.2–1.2)

## 2014-10-29 LAB — CBC WITH DIFFERENTIAL/PLATELET
BASOS ABS: 0.1 10*3/uL (ref 0.0–0.1)
Basophils Relative: 1 % (ref 0–1)
Eosinophils Absolute: 0.1 10*3/uL (ref 0.0–0.7)
Eosinophils Relative: 1 % (ref 0–5)
HEMATOCRIT: 38.3 % — AB (ref 39.0–52.0)
Hemoglobin: 13.1 g/dL (ref 13.0–17.0)
LYMPHS ABS: 1.2 10*3/uL (ref 0.7–4.0)
Lymphocytes Relative: 14 % (ref 12–46)
MCH: 28.5 pg (ref 26.0–34.0)
MCHC: 34.2 g/dL (ref 30.0–36.0)
MCV: 83.3 fL (ref 78.0–100.0)
MPV: 10.8 fL (ref 8.6–12.4)
Monocytes Absolute: 1.2 10*3/uL — ABNORMAL HIGH (ref 0.1–1.0)
Monocytes Relative: 14 % — ABNORMAL HIGH (ref 3–12)
NEUTROS PCT: 70 % (ref 43–77)
Neutro Abs: 5.8 10*3/uL (ref 1.7–7.7)
Platelets: 235 10*3/uL (ref 150–400)
RBC: 4.6 MIL/uL (ref 4.22–5.81)
RDW: 15.1 % (ref 11.5–15.5)
WBC: 8.3 10*3/uL (ref 4.0–10.5)

## 2014-10-29 MED ORDER — FLUTICASONE PROPIONATE 50 MCG/ACT NA SUSP: 2.0000 | Freq: Every day | NASAL | Status: DC

## 2014-10-29 MED ORDER — LISINOPRIL 10 MG PO TABS: 10.0000 mg | ORAL_TABLET | Freq: Every day | ORAL | Status: DC

## 2014-10-29 NOTE — Patient Instructions (Signed)
1. SWITCH LISINOPRIL 10MG  TO SUPPER TIME.

## 2014-10-29 NOTE — Progress Notes (Signed)
Subjective:    Patient ID: Chad [name], male    DOB: May 17, 1931, 81 y.o.   MRN: [id number]  10/29/2014  Hypertension   HPI This 81 y.o. male presents for six month follow-up:  1. HTN:  Takes Lisinopril 10mg  with dizziness; previously took Lisinopril 10mg  with supper and did not suffer with dizziness.  Was previously taking 2.5mg  daily and was increased [place] to 10mg  daily.  Sporadically checks BP at home; runs 140/80.    2.  Dizziness:  If takes Lisinopril at 7:00am; will suffer with dizziness 9:00am; duration of mild dizziness, 5-10 minutes.  No syncope.  Ongoing issue since taking 10mg  tablet. Bedtime is usually 11:00pm.  Watches television.  3.  Atrial flutter:  Followed by Jens Som every year.  Taking Cardizem as needed; last dose months ago.  4.  Gastritis/GERD: taking Prilosec daily.  Dry cough a lot at night mostly; onset before starting ACI-I.    5.  PND/rhinorrhea/cough nighttime: not taking anything at night.  Used OTC nasal spray; used one night; nose started bleeding.  Will not cough at all in the evening.  When gets ready for bed, starts coughing.  6.  Peripheral neuropathy: B feet; can extend into proximal calves.  Much improved with Gabapentin 900mg  qhs. Onset of neuropathy after cardiac ablation.  S/p cardiac catheterization that was WNL.  Placed on Xarelto. Also s/p cardiac ablation. S/p evaluation by [facility]/podiatry; prescribed Gabapentin.   Review of Systems  Constitutional: Negative for fever, chills, diaphoresis, activity change, appetite change and fatigue.  HENT: Positive for postnasal drip and rhinorrhea. Negative for congestion.   Eyes: Negative for visual disturbance.  Respiratory: Positive for cough. Negative for shortness of breath.   Cardiovascular: Negative for chest pain, palpitations and leg swelling.  Gastrointestinal: Negative for nausea, vomiting, abdominal pain, diarrhea and constipation.  Endocrine: Negative for cold intolerance, heat  intolerance, polydipsia, polyphagia and polyuria.  Neurological: Positive for dizziness and numbness. Negative for tremors, seizures, syncope, facial asymmetry, speech difficulty, weakness, light-headedness and headaches.    Past Medical History  Diagnosis Date  . Hypertension   . Colon polyps   . Diverticulosis of colon (without mention of hemorrhage)   . Internal and external hemorrhoids without complication   . Gastritis, chronic     Pt denies bleeding 04/2012. EGD 2012 reportedly normal.  . COPD (chronic obstructive pulmonary disease) (HCC)   . IBS (irritable bowel syndrome)   . 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 Hyponatremia     04/2012 r/t diuretic.  [name] Abnormal EKG     ST changes with normal coronaries by cath 05/06/12.  . Carotid artery calcification     By CXR - dopplers 05/06/12 without obvious evidence of significant ICA stenosis >40%  . GERD (gastroesophageal reflux disease)   . Atrial flutter (HCC)     a. Slow ~100bpm when in 2:1; dx 04/2012. b. Placed on Xarelto.  c. s/p ablation 07-08-2012 by Dr Johney Frame  . Pneumonia     "couple times" (01/12/2013)  . Atrial tachycardia (HCC)   . Arthritis     "hands" (01/12/2013); feet (podiatry consultation)   Past Surgical History  Procedure Laterality Date  . 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 Trigger finger release Right 1970's    "2 fingers"  . Hemorrhoid surgery  1968  . Colonoscopy  08/29/10    diverticulosis, internal hemorrhoids  . Esophagogastroduodenoscopy  09/20/10    normal  . Atrial flutter ablation  07-08-2012  of typical atrial flutter by Dr Johney Frame  . Cardiac catheterization  04/2012  . Left heart catheterization with coronary angiogram N/A 05/06/2012    Procedure: LEFT HEART CATHETERIZATION WITH CORONARY ANGIOGRAM;  Surgeon: Peter M Swaziland, MD;  Location: [facility] CATH LAB;  Service: Cardiovascular;  Laterality: N/A;  . Atrial flutter ablation N/A 07/08/2012    Procedure: ATRIAL FLUTTER ABLATION;  Surgeon: Hillis Range, MD;  Location: [facility] CATH LAB;  Service: Cardiovascular;  Laterality: N/A;   Allergies  Allergen Reactions  . Dyazide [Hydrochlorothiazide W-Triamterene] Other (See Comments)    Caused hyponatremia 04/2012   Current Outpatient Prescriptions  Medication Sig Dispense Refill  . diltiazem (CARDIZEM) 30 MG tablet Take 1 tablet (30 mg total) by mouth every 6 (six) hours as needed. For palpitations. 30 tablet 2  . omeprazole (PRILOSEC) 20 MG capsule Take 1 capsule (20 mg total) by mouth daily. 90 capsule 3  . fluticasone (FLONASE) 50 MCG/ACT nasal spray Place 2 sprays into both nostrils daily. 16 g 11  . gabapentin (NEURONTIN) 300 MG capsule Take 3 capsules at bedtime. 270 capsule 11  . lisinopril (PRINIVIL,ZESTRIL) 10 MG tablet Take 1 tablet (10 mg total) by mouth daily. 90 tablet 3   No current facility-administered medications for this visit.   Social History   Social History  . Marital Status: Divorced    Spouse Name: N/A  . Number of Children: N/A  . Years of Education: N/A   Occupational History  . Retired    Social History Main Topics  . Smoking status: Former Smoker -- 38 years    Types: Cigarettes, Pipe    Quit date: 01/09/1996  . Smokeless tobacco: Never Used     Comment:    . Alcohol Use: 0.0 oz/week    0 Standard drinks or equivalent per week     Comment: 01/12/2013 "did drink 2-3 12 oz beers a day; stopped drinking in 04/2012"  . Drug Use: No  . Sexual Activity: No   Other Topics Concern  . Not on file   Social History Narrative   Marital status: divorced; lives with ex-wife      Children:  2 sons, 2 grandsons, 1 granddaughter; no gg.      Lives: with ex-wife in house.      Employment: retired first time age 67; retired in 1997.  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      Tobacco: quit smoking 1997; smoked for 27 years.      Alcohol: quit 2004      Exercise:  Walking daily short distances.      ADLs:  NO assistant devices; has cane if needs it;  drives; pays bill; grocery shopping; wife cleans house and does laundry.     Advanced Directives:  +LIVING WILL; +FULL CODE      Family History  Problem Relation Age of Onset  . Heart disease Brother   . Diabetes    . Throat cancer Brother   . Other Mother     UNSURE  . Other Father     UNSURE  . Emphysema Sister   . Emphysema Sister   . Other Brother     UNSURE       Objective:    BP 160/90 mmHg  Pulse 97  Temp(Src) 97.3 F (36.3 C) (Oral)  Resp 16  Wt 179 lb 4.8 oz (81.33 kg) Physical Exam  Constitutional: He is oriented to person, place, and time. He appears well-developed and well-nourished. No  distress.  HENT:  Head: Normocephalic and atraumatic.  Right Ear: External ear normal.  Left Ear: External ear normal.  Nose: Nose normal.  Mouth/Throat: Oropharynx is clear and moist.  Eyes: Conjunctivae and EOM are normal. Pupils are equal, round, and reactive to light.  Neck: Normal range of motion. Neck supple. Carotid bruit is not present. No thyromegaly present.  Cardiovascular: Normal rate, regular rhythm, normal heart sounds and intact distal pulses.  Exam reveals no gallop and no friction rub.   No murmur heard. Pulmonary/Chest: Effort normal and breath sounds normal. He has no wheezes. He has no rales.  Abdominal: Soft. Bowel sounds are normal. He exhibits no distension and no mass. There is no tenderness. There is no rebound and no guarding.  Lymphadenopathy:    He has no cervical adenopathy.  Neurological: He is alert and oriented to person, place, and time. No cranial nerve deficit.  Skin: Skin is warm and dry. No rash noted. He is not diaphoretic.  Psychiatric: He has a normal mood and affect. His behavior is normal.  Nursing note and vitals reviewed.       Assessment & Plan:   1. Essential hypertension   2. Anemia, unspecified anemia type   3. Hyponatremia   4. Localized cancer of throat (HCC)   5. Intra-atrial reentry tachycardia   6. Aortic  stenosis   7. Need for prophylactic vaccination and inoculation against influenza   8. Hereditary and idiopathic peripheral neuropathy    1. HTN: uncontrolled due to non-compliance with Lisinopril; recommend taking Lisinopril every night after supper. 2.  Anemia:  Chronic; repeat today.  3.  Hyponatremia: chronic; repeat today. 4.  Throat cancer: stable; no recent issues. 5.  Intra-atrial reentry tachycardia: stable; uses Cardizem PRN.  Followed by cardiology annually. 6.  Peripheral neuropathy; stable with Gabapentin. 7. S/p flu vaccine. 8. Nighttime cough: New.  Onset BEFORE starting Lisinopril; add Flonase qhs. 9. GERD: stable on Omeprazole daily.  Orders Placed This Encounter  Procedures  . Flu Vaccine QUAD 36+ mos IM  . CBC with Differential/Platelet  . Comprehensive metabolic panel   Meds ordered this encounter  Medications  . fluticasone (FLONASE) 50 MCG/ACT nasal spray    Sig: Place 2 sprays into both nostrils daily.    Dispense:  16 g    Refill:  11    Return in about 6 months (around 04/29/2015) for complete physical examiniation.    Ason Heslin Paulita Fujita, M.D. [facility] [address] [place], [place]  [address] [phone] phone [phone] fax

## 2014-11-09 ENCOUNTER — Telehealth: Payer: Self-pay | Admitting: *Deleted

## 2014-11-09 NOTE — Telephone Encounter (Addendum)
Received faxed form requesting Gabapentin 300mg  #90.  Left message for pt to call confirming the Gabapentin dose he is taking.  Spoke with [employer] [employer] and she stated the Gabapentin was switch over rx and they did not have the dosing or instructions.  Left message for pt to call with the Gabapentin amount he takes daily.  Pt called states he takes 3 Gabapentin 300mg  at bedtime. Faxed rx to [facility].

## 2014-11-11 DIAGNOSIS — H40051 Ocular hypertension, right eye: Secondary | ICD-10-CM | POA: Diagnosis not present

## 2014-11-11 MED ORDER — GABAPENTIN 300 MG PO CAPS: ORAL_CAPSULE | ORAL | Status: DC

## 2014-12-13 ENCOUNTER — Ambulatory Visit (INDEPENDENT_AMBULATORY_CARE_PROVIDER_SITE_OTHER): Payer: Medicare Other | Admitting: Physician Assistant

## 2014-12-13 ENCOUNTER — Ambulatory Visit (INDEPENDENT_AMBULATORY_CARE_PROVIDER_SITE_OTHER): Payer: Medicare Other

## 2014-12-13 VITALS — BP 126/82 | HR 85 | Temp 98.4°F | Resp 16 | Ht 73.0 in | Wt 176.4 lb

## 2014-12-13 DIAGNOSIS — R05 Cough: Secondary | ICD-10-CM

## 2014-12-13 DIAGNOSIS — R059 Cough, unspecified: Secondary | ICD-10-CM

## 2014-12-13 DIAGNOSIS — R194 Change in bowel habit: Secondary | ICD-10-CM | POA: Diagnosis not present

## 2014-12-13 DIAGNOSIS — R109 Unspecified abdominal pain: Secondary | ICD-10-CM

## 2014-12-13 NOTE — Patient Instructions (Addendum)
Double your water intake. Start taking an over the counter probiotic for digestive health daily. Eat frequent small meals. Take your heart burn medicine daily. Stay away from foods that bother you like dairy. Keep a diary to see if you can find a link I will send a message to Dr. Stanford Breed about your lisinopril to see if he wants me to prescribe something different. Call me in 1 week and let me know how you;re doing.  Use nasal spray called flonase daily Take prilosec daily for heartburn.   Irritable Bowel Syndrome, Adult Irritable bowel syndrome (IBS) is not one specific disease. It is a group of symptoms that affects the organs responsible for digestion (gastrointestinal or GI tract).  To regulate how your GI tract works, your body sends signals back and forth between your intestines and your brain. If you have IBS, there may be a problem with these signals. As a result, your GI tract does not function normally. Your intestines may become more sensitive and overreact to certain things. This is especially true when you eat certain foods or when you are under stress.  There are four types of IBS. These may be determined based on the consistency of your stool:   IBS with diarrhea.   IBS with constipation.   Mixed IBS.   Unsubtyped IBS.  It is important to know which type of IBS you have. Some treatments are more likely to be helpful for certain types of IBS.  CAUSES  The exact cause of IBS is not known. RISK FACTORS You may have a higher risk of IBS if:  You are a woman.  You are younger than 79 years old.  You have a family history of IBS.  You have mental health problems.  You have had bacterial infection of your GI tract. SIGNS AND SYMPTOMS  Symptoms of IBS vary from person to person. The main symptom is abdominal pain or discomfort. Additional symptoms usually include one or more of the following:   Diarrhea, constipation, or both.   Abdominal swelling or bloating.    Feeling full or sick after eating a small or regular-size meal.   Frequent gas.   Mucus in the stool.   A feeling of having more stool left after a bowel movement.  Symptoms tend to come and go. They may be associated with stress, psychiatric conditions, or nothing at all.  DIAGNOSIS  There is no specific test to diagnose IBS. Your health care provider will make a diagnosis based on a physical exam, medical history, and your symptoms. You may have other tests to rule out other conditions that may be causing your symptoms. These may include:   Blood tests.   X-rays.   CT scan.  Endoscopy and colonoscopy. This is a test in which your GI tract is viewed with a long, thin, flexible tube. TREATMENT There is no cure for IBS, but treatment can help relieve symptoms. IBS treatment often includes:   Changes to your diet, such as:  Eating more fiber.  Avoiding foods that cause symptoms.  Drinking more water.  Eating regular, medium-sized portioned meals.  Medicines. These may include:  Fiber supplements if you have constipation.  Medicine to control diarrhea (antidiarrheal medicines).  Medicine to help control muscle spasms in your GI tract (antispasmodic medicines).  Medicines to help with any mental health issues, such as antidepressants or tranquilizers.  Therapy.  Talk therapy may help with anxiety, depression, or other mental health issues that can make IBS symptoms  worse.  Stress reduction.  Managing your stress can help keep symptoms under control. HOME CARE INSTRUCTIONS   Take medicines only as directed by your health care provider.  Eat a healthy diet.  Avoid foods and drinks with added sugar.  Include more whole grains, fruits, and vegetables gradually into your diet. This may be especially helpful if you have IBS with constipation.  Avoid any foods and drinks that make your symptoms worse. These may include dairy products and caffeinated or  carbonated drinks.  Do not eat large meals.  Drink enough fluid to keep your urine clear or pale yellow.  Exercise regularly. Ask your health care provider for recommendations of good activities for you.  Keep all follow-up visits as directed by your health care provider. This is important. SEEK MEDICAL CARE IF:   You have constant pain.  You have trouble or pain with swallowing.  You have worsening diarrhea. SEEK IMMEDIATE MEDICAL CARE IF:   You have severe and worsening abdominal pain.   You have diarrhea and:   You have a rash, stiff neck, or severe headache.   You are irritable, sleepy, or difficult to awaken.   You are weak, dizzy, or extremely thirsty.   You have bright red blood in your stool or you have black tarry stools.   You have unusual abdominal swelling that is painful.   You vomit continuously.   You vomit blood (hematemesis).   You have both abdominal pain and a fever.    This information is not intended to replace advice given Diet for Irritable Bowel Syndrome When you have irritable bowel syndrome (IBS), the foods you eat and your eating habits are very important. IBS may cause various symptoms, such as abdominal pain, constipation, or diarrhea. Choosing the right foods can help ease discomfort caused by these symptoms. Work with your health care provider and dietitian to find the best eating plan to help control your symptoms. WHAT GENERAL GUIDELINES DO I NEED TO FOLLOW?  Keep a food diary. This will help you identify foods that cause symptoms. Write down:  What you eat and when.  What symptoms you have.  When symptoms occur in relation to your meals.  Avoid foods that cause symptoms. Talk with your dietitian about other ways to get the same nutrients that are in these foods.  Eat more foods that contain fiber. Take a fiber supplement if directed by your dietitian.  Eat your meals slowly, in a relaxed setting.  Aim to eat 5-6  small meals per day. Do not skip meals.  Drink enough fluids to keep your urine clear or pale yellow.  Ask your health care provider if you should take an over-the-counter probiotic during flare-ups to help restore healthy gut bacteria.  If you have cramping or diarrhea, try making your meals low in fat and high in carbohydrates. Examples of carbohydrates are pasta, rice, whole grain breads and cereals, fruits, and vegetables.  If dairy products cause your symptoms to flare up, try eating less of them. You might be able to handle yogurt better than other dairy products because it contains bacteria that help with digestion. WHAT FOODS ARE NOT RECOMMENDED? The following are some foods and drinks that may worsen your symptoms:  Fatty foods, such as [place] fries.  Milk products, such as cheese or ice cream.  Chocolate.  Alcohol.  Products with caffeine, such as coffee.  Carbonated drinks, such as soda. The items listed above may not be a complete list of  foods and beverages to avoid. Contact your dietitian for more information. WHAT FOODS ARE GOOD SOURCES OF FIBER? Your health care provider or dietitian may recommend that you eat more foods that contain fiber. Fiber can help reduce constipation and other IBS symptoms. Add foods with fiber to your diet a little at a time so that your body can get used to them. Too much fiber at once might cause gas and swelling of your abdomen. The following are some foods that are good sources of fiber:  Apples.  Peaches.  Pears.  Berries.  Figs.  Broccoli (raw).  Cabbage.  Carrots.  Raw peas.  Kidney beans.  Lima beans.  Whole grain bread.  Whole grain cereal. FOR MORE INFORMATION  International Foundation for Functional Gastrointestinal Disorders: www.iffgd.[employer] Diabetes and Digestive [employer] Kidney Diseases: NetworkAffair.co.za.aspx   This  information is not intended to replace advice given to you by your health care provider. Make sure you discuss any questions you have with your health care provider.   Document Released: 03/17/2003 Document Revised: 01/15/2014 Document Reviewed: 03/27/2013 Elsevier Interactive Patient Education [employer]. to you by your health care provider. Make sure you discuss any questions you have with your health care provider.   Document Released: 12/25/2004 Document Revised: 01/15/2014 Document Reviewed: 09/11/2013 Elsevier Interactive Patient Education [employer].

## 2014-12-13 NOTE — Progress Notes (Signed)
[facility] [address] [place] [address] [phone]  Date:  12/13/2014   Name:  Chad [name]   DOB:  07/03/1931   MRN:  [id number]  PCP:  Reginia Forts, MD    Chief Complaint: Abdominal Pain and Diarrhea   History of Present Illness:  This is a 81 y.o. male with PMH aortic stenosis, atrial tachycardia, HTN, anemia, COPD, CKD, GERD, IBS, hx throat cancer 1999 who is presenting with abdominal pain and diarrhea. States he went to [facility] on 11/22 to visit his son for Thanksgiving. Since then he has been having increased bowel movements. He states if he eats "anything solid" he gets abdominal cramping and has a BM 10 minutes later. He is having 2-3 BMs per day. They are normal size and consistency. Not hard or loose. Before, he was having 1 BM per day. No blood in stool. Sometimes gets cramping pain and it's just gas. Cramping relieved with BM or gas. Normal colonoscopy 2012. He has been dx'd in the past with IBS although pt does not recall this dx. He has been here several times over the years with gas, bloating, GERD. He was last seen here 10/21 for follow up with Dr. Tamala Julian. Labs at that time - normal CBC, stable renal function, improving hyponatremia, normal liver function. He denies fever, chills, n/v, dysuria. He drinks 16 oz water a day only. He eats small meals throughout the day. Usually has a bowl of ice cream before bed.  Pt also complaining of an intermittent dry, hacking cough. Notices it more at night. He was started on lisinopril in July 2016 by his cardiologist but cough was happening before. He does not feel that it is worsening but states "I am more aware of it". Denies sob or wheezing. He mentioned this to Dr. Tamala Julian at visit 6 weeks ago and she thought it could be d/t postnasal drip. She rx'd flonase which he took for a week or so but stopped, he didn't realize he needed to continue it. He does have a hx of gerd but doesn't take his ppi regularly. He is a  former smoker, quit in 11s.  Pt has a hx throat cancer 1999. It was underneath tongue and into lymph node in neck. Lymph nodes and mass resected. No problems since.  Review of Systems:  Review of Systems See HPI  Patient Active Problem List   Diagnosis Date Noted  . Aortic stenosis 06/08/2013  . Unsteadiness 02/11/2013  . Ectopic atrial tachycardia ([place]) 01/12/2013  . Atrial tachycardia ([place]) 01/12/2013  . Atrial flutter ([place]) 05/06/2012  . Aortic valve sclerosis 05/06/2012  . Acute renal failure ([place]) 05/05/2012  . 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 Localized cancer of throat ([place]) 02/04/2012  . Anemia 07/27/2010  . Essential hypertension 11/15/2008  . COPD 11/15/2008  . GERD 11/15/2008  . IBS 11/15/2008    Prior to Admission medications   Medication Sig Start Date End Date Taking? Authorizing Provider  diltiazem (CARDIZEM) 30 MG tablet Take 1 tablet (30 mg total) by mouth every 6 (six) hours as needed. For palpitations. 08/13/14  Yes Lelon Perla, MD  gabapentin (NEURONTIN) 300 MG capsule Take 3 capsules at bedtime. 11/11/14  Yes Max T Hyatt, DPM  lisinopril (PRINIVIL,ZESTRIL) 10 MG tablet Take 1 tablet (10 mg total) by mouth daily. 10/29/14  Yes Lelon Perla, MD  fluticasone (FLONASE) 50 MCG/ACT nasal spray Place 2 sprays into both nostrils daily. Patient not taking: Reported on  12/13/2014 10/29/14   Wardell Honour, MD  omeprazole (PRILOSEC) 20 MG capsule Take 1 capsule (20 mg total) by mouth daily. Patient not taking: Reported on 12/13/2014 04/28/14   Wardell Honour, MD    Allergies  Allergen Reactions  . Dyazide [Hydrochlorothiazide W-Triamterene] Other (See Comments)    Caused hyponatremia 04/2012    Past Surgical History  Procedure Laterality Date  . 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 Trigger finger release Right 1970's    "2 fingers"  . Hemorrhoid surgery  1968  . Colonoscopy  08/29/10    diverticulosis, internal  hemorrhoids  . Esophagogastroduodenoscopy  09/20/10    normal  . Atrial flutter ablation  07-08-2012    of typical atrial flutter by Dr Rayann Heman  . Cardiac catheterization  04/2012  . Left heart catheterization with coronary angiogram N/A 05/06/2012    Procedure: LEFT HEART CATHETERIZATION WITH CORONARY ANGIOGRAM;  Surgeon: Peter M Martinique, MD;  Location: [facility] CATH LAB;  Service: Cardiovascular;  Laterality: N/A;  . Atrial flutter ablation N/A 07/08/2012    Procedure: ATRIAL FLUTTER ABLATION;  Surgeon: Thompson Grayer, MD;  Location: [facility] CATH LAB;  Service: Cardiovascular;  Laterality: N/A;    Social History  Substance Use Topics  . Smoking status: Former Smoker -- 38 years    Types: Cigarettes, Pipe    Quit date: 01/09/1996  . Smokeless tobacco: Never Used     Comment:    . Alcohol Use: 0.0 oz/week    0 Standard drinks or equivalent per week     Comment: 01/12/2013 "did drink 2-3 12 oz beers a day; stopped drinking in 04/2012"    Family History  Problem Relation Age of Onset  . Heart disease Brother   . Diabetes    . Throat cancer Brother   . Other Mother     UNSURE  . Other Father     UNSURE  . Emphysema Sister   . Emphysema Sister   . Other Brother     UNSURE    Medication list has been reviewed and updated.  Physical Examination:  Physical Exam  Constitutional: He is oriented to person, place, and time. He appears well-developed and well-nourished. No distress.  HENT:  Head: Normocephalic and atraumatic.  Right Ear: Hearing, tympanic membrane, external ear and ear canal normal.  Left Ear: Hearing, tympanic membrane, external ear and ear canal normal.  Nose: Nose normal.  Mouth/Throat: Uvula is midline, oropharynx is clear and moist and mucous membranes are normal.  Eyes: Conjunctivae and lids are normal. Right eye exhibits no discharge. Left eye exhibits no discharge. No scleral icterus.  Neck: Trachea normal. Carotid bruit is not present. No thyromegaly present.   Cardiovascular: Normal rate, regular rhythm, normal heart sounds and normal pulses.   No murmur heard. Pulmonary/Chest: Effort normal and breath sounds normal. No respiratory distress. He has no wheezes. He has no rhonchi. He has no rales.  Abdominal: Soft. Normal appearance. There is no tenderness.  Musculoskeletal: Normal range of motion.  Lymphadenopathy:       Head (right side): No submental, no submandibular and no tonsillar adenopathy present.       Head (left side): No submental, no submandibular and no tonsillar adenopathy present.    He has no cervical adenopathy.  Neurological: He is alert and oriented to person, place, and time.  Skin: Skin is warm, dry and intact. No lesion and no rash noted.  Psychiatric: He has a normal mood and  affect. His speech is normal and behavior is normal. Thought content normal.   BP 126/82 mmHg  Pulse 85  Temp(Src) 98.4 F (36.9 C) (Oral)  Resp 16  Ht 6\' 1"  (1.854 m)  Wt 176 lb 6.4 oz (80.015 kg)  BMI 23.28 kg/m2  SpO2 96%  Radiograph IMPRESSION: Stable exam. No active cardiopulmonary disease.  Assessment and Plan:  1. Cough Chest xray negative. I do not think cough d/t ACE since cough preceded initiation of lisinopril but will send message to cardiologist, Dr. Stanford Breed, to see if he wants me to d/c and start something else. He was started on flonase d/t possible postnasal drip but he did not realize he needed to take consistently. He will start back on flonase as well as his prilosec which he also does not take consistently. F/u 1 month. - DG Chest 2 View; Future  2. Abdominal cramping 3. Change in stool habits Symptoms sound like IBS. Stools are not looser or harder, he is just having more frequent BMs with assoc abd cramping which is relieved with gas or BM. Symptoms started while traveling to [facility] and have persisted since getting home 5 days ago. Possible that stress from traveling triggered sx. We discussed diet, stress relief,  digestive probiotic, hydration. Colonoscopy 2012 normal. I advised he call me in 1 week and let me know how he is doing. If not improved, will refer to GI.  Benjaman Pott Drenda Freeze, MHS [facility]  12/19/2014

## 2014-12-21 ENCOUNTER — Telehealth: Payer: Self-pay

## 2014-12-21 DIAGNOSIS — IMO0001 Reserved for inherently not codable concepts without codable children: Secondary | ICD-10-CM

## 2014-12-21 NOTE — Telephone Encounter (Signed)
Pt is needing to talk with nicole bush about how he is doing since the last visit  Best number [phone]

## 2014-12-22 NOTE — Progress Notes (Signed)
  Medical screening examination/treatment/procedure(s) were performed by non-physician practitioner and as supervising physician I was immediately available for consultation/collaboration.     

## 2014-12-23 DIAGNOSIS — H353232 Exudative age-related macular degeneration, bilateral, with inactive choroidal neovascularization: Secondary | ICD-10-CM | POA: Diagnosis not present

## 2014-12-23 DIAGNOSIS — H43813 Vitreous degeneration, bilateral: Secondary | ICD-10-CM | POA: Diagnosis not present

## 2014-12-23 NOTE — Telephone Encounter (Signed)
Pt states he is not having as frequent BMs but still having a lot of gas. He is wanting a referral to GI. Referral placed to Dr. Carlean Purl, who he has seen in the past.

## 2014-12-29 ENCOUNTER — Telehealth: Payer: Self-pay | Admitting: Physician Assistant

## 2014-12-29 NOTE — Telephone Encounter (Signed)
Sent message to Dr. Stanford Breed about switching lisinopril d/t pt's daily dry cough. Dr. Stanford Breed said ok to switch to ARB. Spoke with pt today who said his cough has gone away since OV. He started back on prilosec daily. Very likely that cough could be d/t gerd. Will keep lisinopril the way it is. He will return if cough returns.  Pt states his abdominal pain and gas are getting better. He has appt with Dr. Carlean Purl 03/02/15. He was wondering if it was possible to see someone else sooner. Will send message to referrals.

## 2014-12-29 NOTE — Telephone Encounter (Signed)
Elmyra Ricks, I sent a message to the referral coordinator about trying to get him an earlier appointment.  I will let you know what I find out.

## 2015-01-18 ENCOUNTER — Other Ambulatory Visit (INDEPENDENT_AMBULATORY_CARE_PROVIDER_SITE_OTHER): Payer: Medicare Other

## 2015-01-18 ENCOUNTER — Encounter: Payer: Self-pay | Admitting: Gastroenterology

## 2015-01-18 ENCOUNTER — Telehealth: Payer: Self-pay | Admitting: Gastroenterology

## 2015-01-18 ENCOUNTER — Ambulatory Visit (INDEPENDENT_AMBULATORY_CARE_PROVIDER_SITE_OTHER): Payer: Medicare Other | Admitting: Gastroenterology

## 2015-01-18 ENCOUNTER — Other Ambulatory Visit: Payer: Self-pay | Admitting: Emergency Medicine

## 2015-01-18 VITALS — BP 130/80 | HR 88 | Ht 73.0 in | Wt 185.0 lb

## 2015-01-18 DIAGNOSIS — IMO0001 Reserved for inherently not codable concepts without codable children: Secondary | ICD-10-CM | POA: Insufficient documentation

## 2015-01-18 DIAGNOSIS — R143 Flatulence: Secondary | ICD-10-CM

## 2015-01-18 LAB — IGA: IGA: 281 mg/dL (ref 68–378)

## 2015-01-18 MED ORDER — CILIDINIUM-CHLORDIAZEPOXIDE 2.5-5 MG PO CAPS: 1.0000 | ORAL_CAPSULE | Freq: Three times a day (TID) | ORAL | Status: DC | PRN

## 2015-01-18 NOTE — Progress Notes (Signed)
01/18/2015 Chad [name] [id number] [birth year]   HISTORY OF PRESENT ILLNESS:  This is a pleasant 82 year old male who is previously known to Dr. Carlean Purl. His last colonoscopy was in August 2012 at which time he was found have moderate diverticulosis and internal/external hemorrhoids. He also had an EGD in 2012, September, that was normal. He has not been seen since that time. He presents to our office today at the request of [name], PA-C, with complaints of gas. He says that since at least October every time that he eats he gets terrible gas. Not associated with any certain foods, and occurs every day. He denies any abdominal pain, but just gets discomfort that moves around related to the gas. He has regular bowel movements without any significant diarrhea or constipation. He denies seeing blood in his stool. He says that sometimes he is not sure if he just has to pass gas or needs to have a bowel movement, which is frustrating to him. The only treatment that he is tried for this is some Librax that he had left from his PCP from a prescription in 2015. He thinks that this may have helped slightly, but only had a few pills left and is now out of the medication. He admits that he eats a lot of dairy products, including milk on his cereal, Ensure products, and a lot of ice cream.   Past Medical History  Diagnosis Date  . Hypertension   . Colon polyps   . Diverticulosis of colon (without mention of hemorrhage)   . Internal and external hemorrhoids without complication   . Gastritis, chronic     Pt denies bleeding 04/2012. EGD 2012 reportedly normal.  . COPD (chronic obstructive pulmonary disease) ([place])   . IBS (irritable bowel syndrome)   . 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 Hyponatremia     04/2012 r/t diuretic.  [name] Abnormal EKG     ST changes with normal coronaries by cath 05/06/12.  . Carotid artery calcification     By CXR - dopplers 05/06/12 without obvious evidence of significant ICA  stenosis >40%  . GERD (gastroesophageal reflux disease)   . Atrial flutter ([place])     a. Slow ~100bpm when in 2:1; dx 04/2012. b. Placed on Xarelto.  c. s/p ablation 07-08-2012 by Dr Rayann Heman  . Pneumonia     "couple times" (01/12/2013)  . Atrial tachycardia ([place])   . Arthritis     "hands" (01/12/2013); feet (podiatry consultation)   Past Surgical History  Procedure Laterality Date  . 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 Trigger finger release Right 1970's    "2 fingers"  . Hemorrhoid surgery  1968  . Colonoscopy  08/29/10    diverticulosis, internal hemorrhoids  . Esophagogastroduodenoscopy  09/20/10    normal  . Atrial flutter ablation  07-08-2012    of typical atrial flutter by Dr Rayann Heman  . Cardiac catheterization  04/2012  . Left heart catheterization with coronary angiogram N/A 05/06/2012    Procedure: LEFT HEART CATHETERIZATION WITH CORONARY ANGIOGRAM;  Surgeon: Peter M Martinique, MD;  Location: [facility] CATH LAB;  Service: Cardiovascular;  Laterality: N/A;  . Atrial flutter ablation N/A 07/08/2012    Procedure: ATRIAL FLUTTER ABLATION;  Surgeon: Thompson Grayer, MD;  Location: [facility] CATH LAB;  Service: Cardiovascular;  Laterality: N/A;    reports that he quit smoking about 19 years ago. His smoking use included Cigarettes and Pipe. He quit after 38 years  of use. He has never used smokeless tobacco. He reports that he drinks alcohol. He reports that he does not use illicit drugs. family history includes Emphysema in his sister and sister; Heart disease in his brother; Other in his brother, father, and mother; Throat cancer in his brother. Allergies  Allergen Reactions  . Dyazide [Hydrochlorothiazide W-Triamterene] Other (See Comments)    Caused hyponatremia 04/2012      Outpatient Encounter Prescriptions as of 01/18/2015  Medication Sig  . clidinium-chlordiazePOXIDE (LIBRAX) 5-2.5 MG capsule Take 1 capsule by mouth 3 (three) times daily as needed.  . diltiazem (CARDIZEM) 30 MG tablet  Take 1 tablet (30 mg total) by mouth every 6 (six) hours as needed. For palpitations.  . fluticasone (FLONASE) 50 MCG/ACT nasal spray Place 2 sprays into both nostrils daily.  [name] gabapentin (NEURONTIN) 300 MG capsule Take 3 capsules at bedtime.  [name] lisinopril (PRINIVIL,ZESTRIL) 10 MG tablet Take 1 tablet (10 mg total) by mouth daily.  [name] omeprazole (PRILOSEC) 20 MG capsule Take 1 capsule (20 mg total) by mouth daily.  . [DISCONTINUED] clidinium-chlordiazePOXIDE (LIBRAX) 5-2.5 MG capsule Take 1 capsule by mouth 3 (three) times daily as needed.   No facility-administered encounter medications on file as of 01/18/2015.     REVIEW OF SYSTEMS  : All other systems reviewed and negative except where noted in the History of Present Illness.   PHYSICAL EXAM: BP 130/80 mmHg  Pulse 88  Ht 6\' 1"  (1.854 m)  Wt 185 lb (83.915 kg)  BMI 24.41 kg/m2 General: Well developed white male in no acute distress Head: Normocephalic and atraumatic Eyes:  Sclerae anicteric, conjunctiva pink. Ears: Normal auditory acuity. Lungs: Clear throughout to auscultation Heart: Regular rate and rhythm Abdomen: Soft, non-distended.  Normal bowel sounds.  Non-tender.  Non-tender, reducible umbilical hernia noted. Musculoskeletal: Symmetrical with no gross deformities  Skin: No lesions on visible extremities Extremities: No edema  Neurological: Alert oriented x 4, grossly non-focal Psychological:  Alert and cooperative. Normal mood and affect  ASSESSMENT AND PLAN: -Gas:  No other associated symptoms.  Likely dietary.  Will check celiac labs.  He eats a lot of dairy products so we discussed possible lactose intolerance so could consider lactose-free diet in the future if these other measures don't help. FODMAP diet could also be another consideration. We will start by having him take a daily probiotic and use Gas-X 3 times daily with meals as needed.  He was previously given Librax by his PCP, so we will refill this since he  thinks he may have had some success in treating his symptoms with this medication. He will just use this on an as-needed basis in addition to the above discussed measures. Could consider trial of Xifaxan or Flagyl for his SIBO/IBS. He will call back in approximately 2-3 weeks with an update of his symptoms.   CC:  Ezekiel Slocumb, PA-C

## 2015-01-18 NOTE — Telephone Encounter (Signed)
Rx sent to pharmacy electronically. Patient notified.

## 2015-01-18 NOTE — Patient Instructions (Signed)
Your physician has requested that you go to the basement for lab work before leaving today.  Please take a daily Probiotic such as Align, Florastor, culterelle.   Take OTC gas X three times a day with meals.   Call with an update of how you are feeling in 2-3 weeks. Ask for [place].

## 2015-01-19 LAB — TISSUE TRANSGLUTAMINASE, IGA: TISSUE TRANSGLUTAMINASE AB, IGA: 1 U/mL (ref <4)

## 2015-01-19 NOTE — Progress Notes (Signed)
Agree with Ms. Zehr's management.  Carl E. Gessner, MD, FACG  

## 2015-02-03 DIAGNOSIS — H353222 Exudative age-related macular degeneration, left eye, with inactive choroidal neovascularization: Secondary | ICD-10-CM | POA: Diagnosis not present

## 2015-02-22 ENCOUNTER — Telehealth: Payer: Self-pay | Admitting: Gastroenterology

## 2015-02-22 NOTE — Telephone Encounter (Signed)
Spoke with patient and he states the pain in his stomach is gone. States after he eats still feels like he has to go to the bathroom. Most of the time he does not have a bowel movement just gas.

## 2015-02-23 MED ORDER — POLYETHYLENE GLYCOL 3350 17 GM/SCOOP PO POWD
ORAL | Status: DC
Start: 2015-02-23 — End: 2015-09-15

## 2015-02-23 NOTE — Telephone Encounter (Signed)
Let's have him start miralax daily.  Thank you,  Jess

## 2015-02-23 NOTE — Telephone Encounter (Signed)
Left a message for patient to call back. 

## 2015-02-23 NOTE — Telephone Encounter (Signed)
Patient aware Rx sent.  

## 2015-02-23 NOTE — Telephone Encounter (Signed)
Patient states he is constipated. He does not have a bowel movement every day and has to "push to get it out."

## 2015-02-23 NOTE — Telephone Encounter (Signed)
Is patient moving his bowels regularly or does he feel constipated?

## 2015-03-02 ENCOUNTER — Ambulatory Visit: Payer: Medicare Other | Admitting: Internal Medicine

## 2015-04-14 DIAGNOSIS — H353222 Exudative age-related macular degeneration, left eye, with inactive choroidal neovascularization: Secondary | ICD-10-CM | POA: Diagnosis not present

## 2015-04-14 DIAGNOSIS — Z961 Presence of intraocular lens: Secondary | ICD-10-CM | POA: Diagnosis not present

## 2015-04-14 DIAGNOSIS — H2511 Age-related nuclear cataract, right eye: Secondary | ICD-10-CM | POA: Diagnosis not present

## 2015-04-14 DIAGNOSIS — H40051 Ocular hypertension, right eye: Secondary | ICD-10-CM | POA: Diagnosis not present

## 2015-04-21 DIAGNOSIS — H353212 Exudative age-related macular degeneration, right eye, with inactive choroidal neovascularization: Secondary | ICD-10-CM | POA: Diagnosis not present

## 2015-04-21 DIAGNOSIS — H43813 Vitreous degeneration, bilateral: Secondary | ICD-10-CM | POA: Diagnosis not present

## 2015-04-21 DIAGNOSIS — H353221 Exudative age-related macular degeneration, left eye, with active choroidal neovascularization: Secondary | ICD-10-CM | POA: Diagnosis not present

## 2015-04-29 ENCOUNTER — Encounter: Payer: Medicare Other | Admitting: Family Medicine

## 2015-05-26 DIAGNOSIS — H353221 Exudative age-related macular degeneration, left eye, with active choroidal neovascularization: Secondary | ICD-10-CM | POA: Diagnosis not present

## 2015-05-30 DIAGNOSIS — H43812 Vitreous degeneration, left eye: Secondary | ICD-10-CM | POA: Diagnosis not present

## 2015-05-31 ENCOUNTER — Encounter: Payer: Self-pay | Admitting: Family Medicine

## 2015-05-31 ENCOUNTER — Ambulatory Visit (INDEPENDENT_AMBULATORY_CARE_PROVIDER_SITE_OTHER): Payer: Medicare Other | Admitting: Family Medicine

## 2015-05-31 ENCOUNTER — Telehealth: Payer: Self-pay | Admitting: Cardiology

## 2015-05-31 VITALS — BP 102/76 | HR 104 | Temp 97.8°F | Resp 16 | Ht 73.0 in | Wt 181.6 lb

## 2015-05-31 DIAGNOSIS — R198 Other specified symptoms and signs involving the digestive system and abdomen: Secondary | ICD-10-CM

## 2015-05-31 DIAGNOSIS — R202 Paresthesia of skin: Secondary | ICD-10-CM | POA: Diagnosis not present

## 2015-05-31 DIAGNOSIS — C140 Malignant neoplasm of pharynx, unspecified: Secondary | ICD-10-CM

## 2015-05-31 DIAGNOSIS — K2100 Gastro-esophageal reflux disease with esophagitis, without bleeding: Secondary | ICD-10-CM

## 2015-05-31 DIAGNOSIS — Z0000 Encounter for general adult medical examination without abnormal findings: Secondary | ICD-10-CM

## 2015-05-31 DIAGNOSIS — K580 Irritable bowel syndrome with diarrhea: Secondary | ICD-10-CM

## 2015-05-31 DIAGNOSIS — K6289 Other specified diseases of anus and rectum: Secondary | ICD-10-CM

## 2015-05-31 DIAGNOSIS — I4892 Unspecified atrial flutter: Secondary | ICD-10-CM

## 2015-05-31 DIAGNOSIS — I10 Essential (primary) hypertension: Secondary | ICD-10-CM

## 2015-05-31 DIAGNOSIS — K219 Gastro-esophageal reflux disease without esophagitis: Secondary | ICD-10-CM | POA: Diagnosis not present

## 2015-05-31 DIAGNOSIS — D6489 Other specified anemias: Secondary | ICD-10-CM | POA: Diagnosis not present

## 2015-05-31 DIAGNOSIS — I350 Nonrheumatic aortic (valve) stenosis: Secondary | ICD-10-CM | POA: Diagnosis not present

## 2015-05-31 LAB — COMPREHENSIVE METABOLIC PANEL
ALK PHOS: 75 U/L (ref 40–115)
ALT: 14 U/L (ref 9–46)
AST: 26 U/L (ref 10–35)
Albumin: 4.2 g/dL (ref 3.6–5.1)
BUN: 13 mg/dL (ref 7–25)
CHLORIDE: 99 mmol/L (ref 98–110)
CO2: 27 mmol/L (ref 20–31)
Calcium: 9.2 mg/dL (ref 8.6–10.3)
Creat: 1.32 mg/dL — ABNORMAL HIGH (ref 0.70–1.11)
Glucose, Bld: 92 mg/dL (ref 65–99)
POTASSIUM: 4.5 mmol/L (ref 3.5–5.3)
Sodium: 137 mmol/L (ref 135–146)
TOTAL PROTEIN: 7.5 g/dL (ref 6.1–8.1)
Total Bilirubin: 0.5 mg/dL (ref 0.2–1.2)

## 2015-05-31 LAB — CBC WITH DIFFERENTIAL/PLATELET
Basophils Absolute: 95 cells/uL (ref 0–200)
Basophils Relative: 1 %
Eosinophils Absolute: 285 cells/uL (ref 15–500)
Eosinophils Relative: 3 %
HEMATOCRIT: 44.7 % (ref 38.5–50.0)
Hemoglobin: 14.7 g/dL (ref 13.2–17.1)
LYMPHS PCT: 19 %
Lymphs Abs: 1805 cells/uL (ref 850–3900)
MCH: 28 pg (ref 27.0–33.0)
MCHC: 32.9 g/dL (ref 32.0–36.0)
MCV: 85.1 fL (ref 80.0–100.0)
MONO ABS: 855 {cells}/uL (ref 200–950)
MPV: 11.3 fL (ref 7.5–12.5)
Monocytes Relative: 9 %
NEUTROS PCT: 68 %
Neutro Abs: 6460 cells/uL (ref 1500–7800)
Platelets: 235 10*3/uL (ref 140–400)
RBC: 5.25 MIL/uL (ref 4.20–5.80)
RDW: 15.2 % — AB (ref 11.0–15.0)
WBC: 9.5 10*3/uL (ref 3.8–10.8)

## 2015-05-31 LAB — POCT URINALYSIS DIP (MANUAL ENTRY)
Bilirubin, UA: NEGATIVE
Glucose, UA: NEGATIVE
Ketones, POC UA: NEGATIVE
Leukocytes, UA: NEGATIVE
Nitrite, UA: NEGATIVE
Protein Ur, POC: NEGATIVE
Spec Grav, UA: 1.015
Urobilinogen, UA: 0.2
pH, UA: 7

## 2015-05-31 LAB — POC MICROSCOPIC URINALYSIS (UMFC): Mucus: ABSENT

## 2015-05-31 MED ORDER — OMEPRAZOLE 20 MG PO CPDR: 20.0000 mg | DELAYED_RELEASE_CAPSULE | Freq: Every day | ORAL | Status: DC

## 2015-05-31 MED ORDER — GABAPENTIN 300 MG PO CAPS: ORAL_CAPSULE | ORAL | Status: DC

## 2015-05-31 MED ORDER — FLUTICASONE PROPIONATE 50 MCG/ACT NA SUSP: 2.0000 | Freq: Every day | NASAL | Status: DC

## 2015-05-31 MED ORDER — LISINOPRIL 10 MG PO TABS: 10.0000 mg | ORAL_TABLET | Freq: Every day | ORAL | Status: DC

## 2015-05-31 NOTE — Patient Instructions (Addendum)
   IF you received an x-ray today, you will receive an invoice from [facility]. Please contact [facility] at [phone] with questions or concerns regarding your invoice.   IF you received labwork today, you will receive an invoice from [facility]. Please contact [facility] at [phone] with questions or concerns regarding your invoice.   Our billing staff will not be able to assist you with questions regarding bills from these companies.  You will be contacted with the lab results as soon as they are available. The fastest way to get your results is to activate your My Chart account. Instructions are located on the last page of this paperwork. If you have not heard from us regarding the results in 2 weeks, please contact this office.    Keeping you healthy  Get these tests  Blood pressure- Have your blood pressure checked once a year by your healthcare provider.  Normal blood pressure is 120/80  Weight- Have your body mass index (BMI) calculated to screen for obesity.  BMI is a measure of body fat based on height and weight. You can also calculate your own BMI at www.[place].com/bmi/.  Cholesterol- Have your cholesterol checked every year.  Diabetes- Have your blood sugar checked regularly if you have high blood pressure, high cholesterol, have a family history of diabetes or if you are overweight.  Screening for Colon Cancer- Colonoscopy starting at age 50.  Screening may begin sooner depending on your family history and other health conditions. Follow up colonoscopy as directed by your Gastroenterologist.  Screening for Prostate Cancer- Both blood work (PSA) and a rectal exam help screen for Prostate Cancer.  Screening begins at age 40 with African-American men and at age 50 with Caucasian men.  Screening may begin sooner depending on your family history.  Take these medicines  Aspirin- One aspirin daily can help prevent Heart  disease and Stroke.  Flu shot- Every fall.  Tetanus- Every 10 years.  Zostavax- Once after the age of 60 to prevent Shingles.  Pneumonia shot- Once after the age of 65; if you are younger than 65, ask your healthcare provider if you need a Pneumonia shot.  Take these steps  Don't smoke- If you do smoke, talk to your doctor about quitting.  For tips on how to quit, go to www.smokefree.gov or call 1-800-QUIT-NOW.  Be physically active- Exercise 5 days a week for at least 30 minutes.  If you are not already physically active start slow and gradually work up to 30 minutes of moderate physical activity.  Examples of moderate activity include walking briskly, mowing the yard, dancing, swimming, bicycling, etc.  Eat a healthy diet- Eat a variety of healthy food such as fruits, vegetables, low fat milk, low fat cheese, yogurt, lean meant, poultry, fish, beans, tofu, etc. For more information go to www.thenutritionsource.org  Drink alcohol in moderation- Limit alcohol intake to less than two drinks a day. Never drink and drive.  Dentist- Brush and floss twice daily; visit your dentist twice a year.  Depression- Your emotional health is as important as your physical health. If you're feeling down, or losing interest in things you would normally enjoy please talk to your healthcare provider.  Eye exam- Visit your eye doctor every year.  Safe sex- If you may be exposed to a sexually transmitted infection, use a condom.  Seat belts- Seat belts can save your life; always wear one.  Smoke/Carbon Monoxide detectors- These detectors need to be installed on   the appropriate level of your home.  Replace batteries at least once a year.  Skin cancer- When out in the sun, cover up and use sunscreen 15 SPF or higher.  Violence- If anyone is threatening you, please tell your healthcare provider.  Living Will/ Health care power of attorney- Speak with your healthcare provider and family. 

## 2015-05-31 NOTE — Progress Notes (Signed)
Subjective:    Patient ID: Chad [name], male    DOB: Nov 02, 1931, 82 y.o.   MRN: [id number]  05/31/2015  Annual Exam   HPI This 82 y.o. male presents for Annual Wellness Examination and six month follow-up of chronic medical conditions.  Last physical:  04-28-2014 Colonoscopy: 06/26/2005, 08/29/2010 TDAP:  2010 Pneumovax:  2010; 04/2014 Zostavax: n/a Influenza:  10/29/2014  Chills: had two separate episodes of chills and rigors in March; duration 15 minutes.  Blurred vision: last month, having lunch; vision got cloudy; got up and vision developed waves; went to bathroom and worsened; continued during drive; got home went to sleep and resolved. No recurrence.  Last eye exam yesterday.  Received injection in L eye; no abnormality; did not discuss episode with ophthalmology.  Good retinal evaluation; normal exam.  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    Gas: after eating, must pass gas.  Did give Miralax without benefit. Started eating Fiberone cereal with regular bowel movements.  Onset December 2016.  Stopped Omeprazole this year.  Did not think Omeprazole helping.  Having b.m. Qod.     HTN: uncontrolled due to non-compliance with Lisinopril; recommended taking Lisinopril every night after supper.  Anemia: Chronic; repeated labs at last visit.  Hyponatremia: chronic; followed at each visit.  H/o Throat cancer: stable; no recent issues.  Intra-atrial reentry tachycardia: stable; uses Cardizem PRN. Followed by cardiology annually.  Peripheral neuropathy; stable with Gabapentin.  Nighttime cough: New. Onset BEFORE starting Lisinopril; added Flonase qhs.  GERD: stable on Omeprazole daily.  Review of Systems  Constitutional: Positive for chills. Negative for fever, diaphoresis, activity change, appetite change, fatigue and unexpected weight change.  HENT: Negative for congestion, dental problem, drooling, ear discharge, ear pain, facial  swelling, hearing loss, mouth sores, nosebleeds, postnasal drip, rhinorrhea, sinus pressure, sneezing, sore throat, tinnitus, trouble swallowing and voice change.   Eyes: Positive for visual disturbance. Negative for photophobia, pain, discharge, redness and itching.  Respiratory: Positive for cough. Negative for apnea, choking, chest tightness, shortness of breath, wheezing and stridor.   Cardiovascular: Negative for chest pain, palpitations and leg swelling.  Gastrointestinal: Negative for nausea, vomiting, abdominal pain, diarrhea, constipation and blood in stool.  Endocrine: Negative for cold intolerance, heat intolerance, polydipsia, polyphagia and polyuria.  Genitourinary: Negative for dysuria, urgency, frequency, hematuria, flank pain, decreased urine volume, discharge, penile swelling, scrotal swelling, enuresis, difficulty urinating, genital sores, penile pain and testicular pain.  Musculoskeletal: Negative for myalgias, back pain, joint swelling, arthralgias, gait problem, neck pain and neck stiffness.  Skin: Negative for color change, pallor, rash and wound.  Allergic/Immunologic: Negative for environmental allergies, food allergies and immunocompromised state.  Neurological: Positive for numbness. Negative for dizziness, tremors, seizures, syncope, facial asymmetry, speech difficulty, weakness, light-headedness and headaches.  Hematological: Negative for adenopathy. Does not bruise/bleed easily.  Psychiatric/Behavioral: Negative for suicidal ideas, hallucinations, behavioral problems, confusion, sleep disturbance, self-injury, dysphoric mood, decreased concentration and agitation. The patient is not nervous/anxious and is not hyperactive.     Past Medical History  Diagnosis Date  . Hypertension   . Colon polyps 06/26/2005  . Diverticulosis of colon (without mention of hemorrhage)   . Internal and external hemorrhoids without complication   . Gastritis, chronic     Pt denies bleeding  04/2012. EGD 2012 reportedly normal.  . COPD (chronic obstructive pulmonary disease) ([place])   . IBS (irritable bowel syndrome)   . 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 Hyponatremia  04/2012 r/t diuretic.  [name] Abnormal EKG     ST changes with normal coronaries by cath 05/06/12.  . Carotid artery calcification     By CXR - dopplers 05/06/12 without obvious evidence of significant ICA stenosis >40%  . GERD (gastroesophageal reflux disease)   . Atrial flutter ([place])     a. Slow ~100bpm when in 2:1; dx 04/2012. b. Placed on Xarelto.  c. s/p ablation 07-08-2012 by Dr Rayann Heman  . Pneumonia     "couple times" (01/12/2013)  . Atrial tachycardia ([place])   . Arthritis     "hands" (01/12/2013); feet (podiatry consultation)  . Macular degeneration of left eye    Past Surgical History  Procedure Laterality Date  . 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 Trigger finger release Right 1970's    "2 fingers"  . Hemorrhoid surgery  1968  . Colonoscopy  08/29/10    diverticulosis, internal hemorrhoids  . Esophagogastroduodenoscopy  09/20/10    normal  . Atrial flutter ablation  07-08-2012    of typical atrial flutter by Dr Rayann Heman  . Cardiac catheterization  04/2012  . Left heart catheterization with coronary angiogram N/A 05/06/2012    Procedure: LEFT HEART CATHETERIZATION WITH CORONARY ANGIOGRAM;  Surgeon: Peter M Martinique, MD;  Location: [facility] C[facility] LAB;  Service: Cardiovascular;  Laterality: N/A;  . Atrial flutter ablation N/A 07/08/2012    Procedure: ATRIAL FLUTTER ABLATION;  Surgeon: Thompson Grayer, MD;  Location: [facility] CATH LAB;  Service: Cardiovascular;  Laterality: N/A;  . Eye surgery  01/08/2014    Cataract resection L.     Allergies  Allergen Reactions  . Dyazide [Hydrochlorothiazide W-Triamterene] Other (See Comments)    Caused hyponatremia 04/2012   Current Outpatient Prescriptions  Medication Sig Dispense Refill  . clidinium-chlordiazePOXIDE (LIBRAX) 5-2.5 MG capsule Take 1 capsule by mouth 3 (three) times  daily as needed. 60 capsule 3  . diltiazem (CARDIZEM) 30 MG tablet Take 1 tablet (30 mg total) by mouth every 6 (six) hours as needed. For palpitations. 30 tablet 2  . fluticasone (FLONASE) 50 MCG/ACT nasal spray Place 2 sprays into both nostrils daily. 48 g 3  . gabapentin (NEURONTIN) 300 MG capsule Take 3 capsules at bedtime. 270 capsule 3  . lisinopril (PRINIVIL,ZESTRIL) 10 MG tablet Take 1 tablet (10 mg total) by mouth daily. 90 tablet 3  . omeprazole (PRILOSEC) 20 MG capsule Take 1 capsule (20 mg total) by mouth daily. Take one hour before breakfast 90 capsule 3  . polyethylene glycol powder (GLYCOLAX/MIRALAX) powder Take 17 grams daily in water or juice 255 g 0   No current facility-administered medications for this visit.   Social History   Social History  . Marital Status: Divorced    Spouse Name: N/A  . Number of Children: 2  . Years of Education: N/A   Occupational History  . Retired    Social History Main Topics  . Smoking status: Former Smoker -- 38 years    Types: Cigarettes, Pipe    Quit date: 01/09/1996  . Smokeless tobacco: Never Used     Comment:    . Alcohol Use: 0.0 oz/week    0 Standard drinks or equivalent per week     Comment: 01/12/2013 "did drink 2-3 12 oz beers a day; stopped drinking in 04/2012"  . Drug Use: No  . Sexual Activity: No   Other Topics Concern  . Not on file   Social History Narrative   Marital status: divorced;  lives with ex-wife; not dating in 2017.      Children:  2 sons, 2 grandsons, 1 granddaughter; no gg.      Lives: with ex-wife in house.      Employment: retired first time age 14; retired in 1997.  Barista x 25 years; Actor.      Tobacco: quit smoking 1997; smoked for 27 years.      Alcohol: quit 2004      Exercise:  Walking daily short distances.       ADLs:  NO assistant devices; has cane if needs it; drives; pays bill; grocery shopping by wife; wife cleans house and does laundry.     Advanced  Directives:  +LIVING WILL; +FULL CODE.  HCPOA: oldest son Makay Scheidt Sr.)      Family History  Problem Relation Age of Onset  . Heart disease Brother   . Diabetes    . Throat cancer Brother   . Other Mother     UNSURE  . Other Father     UNSURE  . Emphysema Sister   . Emphysema Sister   . Other Brother     UNSURE       Objective:    BP 102/76 mmHg  Pulse 104  Temp(Src) 97.8 F (36.6 C) (Oral)  Resp 16  Ht 6\' 1"  (1.854 m)  Wt 181 lb 9.6 oz (82.373 kg)  BMI 23.96 kg/m2  SpO2 97% Physical Exam  Constitutional: He is oriented to person, place, and time. He appears well-developed and well-nourished. No distress.  HENT:  Head: Normocephalic and atraumatic.  Right Ear: External ear normal.  Left Ear: External ear normal.  Nose: Nose normal.  Mouth/Throat: Oropharynx is clear and moist.  Eyes: Conjunctivae and EOM are normal. Pupils are equal, round, and reactive to light.  Neck: Normal range of motion. Neck supple. Carotid bruit is not present. No thyromegaly present.  Cardiovascular: Normal rate, regular rhythm, normal heart sounds and intact distal pulses.  Exam reveals no gallop and no friction rub.   No murmur heard. Pulmonary/Chest: Effort normal and breath sounds normal. He has no wheezes. He has no rales.  Abdominal: Soft. Bowel sounds are normal. He exhibits no distension and no mass. There is no tenderness. There is no rebound and no guarding.  Genitourinary:     +external rectal mass versus large hemorrhoid.  Musculoskeletal:       Right shoulder: Normal.       Left shoulder: Normal.       Cervical back: Normal.  Lymphadenopathy:    He has no cervical adenopathy.  Neurological: He is alert and oriented to person, place, and time. He has normal reflexes. No cranial nerve deficit. He exhibits normal muscle tone. Coordination normal.  Skin: Skin is warm and dry. No rash noted. He is not diaphoretic.  Psychiatric: He has a normal mood and affect. His  behavior is normal. Judgment and thought content normal.   Depression screen [facility] 2/9 05/31/2015 12/13/2014 10/29/2014 04/28/2014  Decreased Interest 0 0 0 0  Down, Depressed, Hopeless 0 0 0 0  PHQ - 2 Score 0 0 0 0   Fall Risk  05/31/2015 10/29/2014 04/28/2014  Falls in the past year? No No No  Functional Status Survey: Is the patient deaf or have difficulty hearing?: Yes Does the patient have difficulty seeing, even when wearing glasses/contacts?: No Does the patient have difficulty concentrating, remembering, or making decisions?: No Does the patient have difficulty walking or  climbing stairs?: No Does the patient have difficulty dressing or bathing?: No Does the patient have difficulty doing errands alone such as visiting a doctor's office or shopping?: No      Assessment & Plan:   1. Medicare annual wellness visit, subsequent   2. Atrial flutter, unspecified type ([place])   3. Aortic stenosis   4. Essential hypertension   5. Localized cancer of throat ([place])   6. Gastroesophageal reflux disease without esophagitis   7. Irritable bowel syndrome with diarrhea   8. Anemia due to other cause   9. Rectal mass   10. Gastroesophageal reflux disease with esophagitis   11. Paresthesias   12. Atrial flutter, unspecified     Orders Placed This Encounter  Procedures  . Comprehensive metabolic panel  . CBC with Differential/Platelet  . Ambulatory referral to Gastroenterology    Referral Priority:  Routine    Referral Type:  Consultation    Referral Reason:  Specialty Services Required    Number of Visits Requested:  1  . POCT urinalysis dipstick  . POCT Microscopic Urinalysis ([facility])   Meds ordered this encounter  Medications  . DISCONTD: omeprazole (PRILOSEC) 20 MG capsule    Sig: Take 1 capsule (20 mg total) by mouth daily. Take one hour before breakfast    Dispense:  90 capsule    Refill:  3  . DISCONTD: lisinopril (PRINIVIL,ZESTRIL) 10 MG tablet    Sig: Take 1 tablet (10 mg  total) by mouth daily.    Dispense:  90 tablet    Refill:  3  . DISCONTD: gabapentin (NEURONTIN) 300 MG capsule    Sig: Take 3 capsules at bedtime.    Dispense:  270 capsule    Refill:  11  . DISCONTD: fluticasone (FLONASE) 50 MCG/ACT nasal spray    Sig: Place 2 sprays into both nostrils daily.    Dispense:  16 g    Refill:  11  . omeprazole (PRILOSEC) 20 MG capsule    Sig: Take 1 capsule (20 mg total) by mouth daily. Take one hour before breakfast    Dispense:  90 capsule    Refill:  3  . lisinopril (PRINIVIL,ZESTRIL) 10 MG tablet    Sig: Take 1 tablet (10 mg total) by mouth daily.    Dispense:  90 tablet    Refill:  3  . gabapentin (NEURONTIN) 300 MG capsule    Sig: Take 3 capsules at bedtime.    Dispense:  270 capsule    Refill:  3  . fluticasone (FLONASE) 50 MCG/ACT nasal spray    Sig: Place 2 sprays into both nostrils daily.    Dispense:  48 g    Refill:  3    Return in about 6 months (around 12/01/2015) for recheck high blood pressure.    Kristi Elayne Guerin, M.D. [facility] [address] [place], [place]  [address] [phone] phone [phone] fax

## 2015-06-01 ENCOUNTER — Telehealth: Payer: Self-pay | Admitting: Internal Medicine

## 2015-06-01 NOTE — Telephone Encounter (Signed)
Can have appt with APP. Saw Janett Billow in Dec recommend bring him back to see her

## 2015-06-01 NOTE — Telephone Encounter (Signed)
5/24 - Dr. Carlean Purl patient - Referral in epic for Rectal Mass.  Due to age, please advise.

## 2015-06-02 NOTE — Telephone Encounter (Signed)
Closed Encounter  °

## 2015-06-03 ENCOUNTER — Encounter: Payer: Self-pay | Admitting: *Deleted

## 2015-06-07 ENCOUNTER — Encounter: Payer: Self-pay | Admitting: Gastroenterology

## 2015-06-07 ENCOUNTER — Ambulatory Visit (INDEPENDENT_AMBULATORY_CARE_PROVIDER_SITE_OTHER): Payer: Medicare Other | Admitting: Gastroenterology

## 2015-06-07 ENCOUNTER — Telehealth: Payer: Self-pay | Admitting: *Deleted

## 2015-06-07 VITALS — BP 116/62 | HR 84 | Ht 73.0 in | Wt 185.0 lb

## 2015-06-07 DIAGNOSIS — K629 Disease of anus and rectum, unspecified: Secondary | ICD-10-CM | POA: Insufficient documentation

## 2015-06-07 NOTE — Telephone Encounter (Signed)
-----   Message from Irven [place] sent at 06/07/2015  1:33 PM EDT ----- Please call Santiago Glad at [facility]  Santiago Glad states that she needs OV notes before scheduling.

## 2015-06-07 NOTE — Progress Notes (Signed)
     06/07/2015 Chad [name] [id number] 08-21-1931   History of Present Illness:  This is an 82 year old male who is known to Dr. Carlean Purl. He was seen by myself in January of this year for an isolated complaint of "gas".  He had no other associated complaints at that time so it was treated symptomatically.  His last colonoscopy was in August 2012 at which time he was found have moderate diverticulosis and internal/external hemorrhoids. He returns to our office today at the request of his PCP, Dr. Reginia Forts, for evaluation of a "rectal mass".  He says that he has been known to have hemorrhoids for several years. He does admit that actually this morning after he took a shower he saw some bright red blood on the towel when drying off.  Current Medications, Allergies, Past Medical History, Past Surgical History, Family History and Social History were reviewed in [employer] record.   Physical Exam: BP 116/62 mmHg  Pulse 84  Ht 6\' 1"  (1.854 m)  Wt 185 lb (83.915 kg)  BMI 24.41 kg/m2 General: Well developed white male in no acute distress Head: Normocephalic and atraumatic Eyes:  Sclerae anicteric, conjunctiva pink  Ears: Normal auditory acuity Rectal:  ? Fungating perianal lesion noted on exam; originates externally.  No masses noted on DRE. Musculoskeletal: Symmetrical with no gross deformities  Extremities: No edema  Neurological: Alert oriented x 4, grossly non-focal Psychological:  Alert and cooperative. Normal mood and affect  Assessment and Recommendations: -Anal lesion:  Fungating appearing lesion/mass originating from the perianal area.  ? Malignancy vs condyloma, etc.  Will refer to [facility] for biospy/removal.

## 2015-06-07 NOTE — Patient Instructions (Signed)
We will call with a referral to [facility]. Their location is [address], [place], [place], [place], [address]. Their phone # is [phone].

## 2015-06-07 NOTE — Telephone Encounter (Signed)
Called karen Brown at [employer]. Advised her when Alonza Bogus PA is done with her note I will fax it.  Santiago Glad gave me an appointment of 06-16-2015 . He is to arrive at 8:30 am for a 9:00 am appointment.  Called patient to inform him.

## 2015-06-16 DIAGNOSIS — A630 Anogenital (venereal) warts: Secondary | ICD-10-CM | POA: Diagnosis not present

## 2015-06-26 ENCOUNTER — Encounter: Payer: Self-pay | Admitting: Family Medicine

## 2015-06-26 NOTE — Progress Notes (Signed)
Agree with Ms. Zehr's management.  Terrica Duecker E. Delphine Sizemore, MD, FACG  

## 2015-06-27 ENCOUNTER — Telehealth: Payer: Self-pay | Admitting: Cardiology

## 2015-06-27 NOTE — Telephone Encounter (Signed)
Received records from [facility] for appointment on 07/18/15 with Dr Stanford Breed.  Records given to [facility] (medical records) for Dr Jacalyn Lefevre schedule on 07/18/15. lp

## 2015-06-29 ENCOUNTER — Telehealth: Payer: Self-pay

## 2015-06-29 NOTE — Telephone Encounter (Signed)
Notes Recorded by Wardell Honour, MD on 06/26/2015 at 12:10 PM 1. Initial urine study shows trace blood; microscopic evaluation of urine sample was normal. 2. Creatinine represents kidney function; your level is slightly elevated or abnormal. Recommend monitoring at each visit. I would avoid Ibuprofen, Advil, Aleve for pain; I would recommend Tylenol for pain or aches.  3. Liver function is normal. 4. Sodium and potassium levels are normal. 5. No evidence of anemia. (Letter)

## 2015-06-29 NOTE — Telephone Encounter (Signed)
Patient is calling to get lab results [phone]

## 2015-06-29 NOTE — Telephone Encounter (Signed)
Patient is calling to get lab results. [phone]

## 2015-06-30 NOTE — Telephone Encounter (Signed)
Spoke with pt, advised lab results. Pt understood. 

## 2015-07-13 NOTE — Progress Notes (Signed)
HPI: FU atrial tachycardia and aortic stenosis. Patient presented in April of 2014 with dizziness and noted to be in atrial flutter. Cardiac catheterization on 05/06/12 revealed normal coronary arteries. Carotid Dopplers April 2014 showed no significant stenosis. TSH normal. Patient had atrial flutter ablation on 07/08/2012. Echo 1/15 showed normal LV function, calcified AOV (visually moderate AS but mean gradient 8 mmHg), biatrial enlargement. Admitted with atrial tachycardia 1/15. Declined ablation. Treated with short acting cardizem as he had evidence of tachy brady. Echo repeated 6/16 and showed normal LV function, mild AS with mean gradient of 11 mmHg, mild LAE. 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 Since last seen, the patient has dyspnea with more extreme activities but not with routine activities. It is relieved with rest. It is not associated with chest pain. There is no orthopnea, PND or pedal edema. There is no syncope or palpitations. There is no exertional chest pain.   Current Outpatient Prescriptions  Medication Sig Dispense Refill  . clidinium-chlordiazePOXIDE (LIBRAX) 5-2.5 MG capsule Take 1 capsule by mouth 3 (three) times daily as needed. 60 capsule 3  . diltiazem (CARDIZEM) 30 MG tablet Take 1 tablet (30 mg total) by mouth every 6 (six) hours as needed. For palpitations. 30 tablet 2  . fluticasone (FLONASE) 50 MCG/ACT nasal spray Place 2 sprays into both nostrils daily. 48 g 3  . gabapentin (NEURONTIN) 300 MG capsule Take 3 capsules at bedtime. 270 capsule 3  . lisinopril (PRINIVIL,ZESTRIL) 10 MG tablet Take 1 tablet (10 mg total) by mouth daily. 90 tablet 3  . omeprazole (PRILOSEC) 20 MG capsule Take 1 capsule (20 mg total) by mouth daily. Take one hour before breakfast 90 capsule 3  . polyethylene glycol powder (GLYCOLAX/MIRALAX) powder Take 17 grams daily in water or juice 255 g 0   No current  facility-administered medications for this visit.     Past Medical History  Diagnosis Date  . Hypertension   . Colon polyps 06/26/2005  . Diverticulosis of colon (without mention of hemorrhage)   . Internal and external hemorrhoids without complication   . Gastritis, chronic     Pt denies bleeding 04/2012. EGD 2012 reportedly normal.  . COPD (chronic obstructive pulmonary disease) ([place])   . IBS (irritable bowel syndrome)   . 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 Hyponatremia     04/2012 r/t diuretic.  [name] Abnormal EKG     ST changes with normal coronaries by cath 05/06/12.  . Carotid artery calcification     By CXR - dopplers 05/06/12 without obvious evidence of significant ICA stenosis >40%  . GERD (gastroesophageal reflux disease)   . Atrial flutter ([place])     a. Slow ~100bpm when in 2:1; dx 04/2012. b. Placed on Xarelto.  c. s/p ablation 07-08-2012 by Dr Rayann Heman  . Pneumonia     "couple times" (01/12/2013)  . Atrial tachycardia ([place])   . Arthritis     "hands" (01/12/2013); feet (podiatry consultation)  . Macular degeneration of left eye     Past Surgical History  Procedure Laterality Date  . 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 Trigger finger release Right 1970's    "2 fingers"  . Hemorrhoid surgery  1968  . Colonoscopy  08/29/10    diverticulosis, internal hemorrhoids  . Esophagogastroduodenoscopy  09/20/10    normal  . Atrial flutter ablation  07-08-2012    of typical  atrial flutter by Dr Rayann Heman  . Cardiac catheterization  04/2012  . Left heart catheterization with coronary angiogram N/A 05/06/2012    Procedure: LEFT HEART CATHETERIZATION WITH CORONARY ANGIOGRAM;  Surgeon: Peter M Martinique, MD;  Location: [facility] CATH LAB;  Service: Cardiovascular;  Laterality: N/A;  . Atrial flutter ablation N/A 07/08/2012    Procedure: ATRIAL FLUTTER ABLATION;  Surgeon: Thompson Grayer, MD;  Location: [facility] CATH LAB;  Service: Cardiovascular;  Laterality: N/A;  . Eye surgery  01/08/2014    Cataract  resection L.      Social History   Social History  . Marital Status: Divorced    Spouse Name: N/A  . Number of Children: 2  . Years of Education: N/A   Occupational History  . Retired    Social History Main Topics  . Smoking status: Former Smoker -- 38 years    Types: Cigarettes, Pipe    Quit date: 01/09/1996  . Smokeless tobacco: Never Used     Comment:    . Alcohol Use: 0.0 oz/week    0 Standard drinks or equivalent per week     Comment: 01/12/2013 "did drink 2-3 12 oz beers a day; stopped drinking in 04/2012"  . Drug Use: No  . Sexual Activity: No   Other Topics Concern  . Not on file   Social History Narrative   Marital status: divorced; lives with ex-wife; not dating in 2017.      Children:  2 sons, 2 grandsons, 1 granddaughter; no gg.      Lives: with ex-wife in house.      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      Tobacco: quit smoking 1997; smoked for 27 years.      Alcohol: quit 2004      Exercise:  Walking daily short distances.       ADLs:  NO assistant devices; has cane if needs it; drives; pays bill; grocery shopping by wife; wife cleans house and does laundry.     Advanced Directives:  +LIVING WILL; +FULL CODE.  HCPOA: oldest son Tighe Weng Sr.)       Family History  Problem Relation Age of Onset  . Heart disease Brother   . Diabetes    . Throat cancer Brother   . Other Mother     UNSURE  . Other Father     UNSURE  . Emphysema Sister   . Emphysema Sister   . Other Brother     UNSURE    ROS: no fevers or chills, productive cough, hemoptysis, dysphasia, odynophagia, melena, hematochezia, dysuria, hematuria, rash, seizure activity, orthopnea, PND, pedal edema, claudication. Remaining systems are negative.  Physical Exam: Well-developed well-nourished in no acute distress.  Skin is warm and dry.  HEENT is normal.  Neck is supple.  Chest is clear to auscultation with  normal expansion.  Cardiovascular exam is regular rate and rhythm. 2/6 systolic murmur Abdominal exam nontender or distended. No masses palpated. Extremities show no edema. neuro grossly intact  ECG Sinus rhythm with first-degree AV block. Left anterior fascicular block. No ST changes.  A/P  1 Atrial tachycardia-patient had 2 recent episodes of palpitations that resolved with Cardizem. He is not interested in pursuing ablation at this point. We can consider in the future if his episodes become more frequent. 2. Aortic stenosis-plan to repeat echocardiogram. 3 hypertension-blood pressure controlled. Continue present medications. 4 Atrial flutter-status post ablation. 5 preoperative  evaluation-plan to repeat echocardiogram. If aortic stenosis is not severe he may proceed without further cardiac evaluation. He has not had any chest pain and previous catheterization did not reveal obstructive coronary disease.  Kirk Ruths, MD

## 2015-07-14 ENCOUNTER — Ambulatory Visit (INDEPENDENT_AMBULATORY_CARE_PROVIDER_SITE_OTHER): Payer: Medicare Other | Admitting: Podiatry

## 2015-07-14 ENCOUNTER — Encounter: Payer: Self-pay | Admitting: Podiatry

## 2015-07-14 DIAGNOSIS — B351 Tinea unguium: Secondary | ICD-10-CM

## 2015-07-14 DIAGNOSIS — M79676 Pain in unspecified toe(s): Secondary | ICD-10-CM

## 2015-07-14 NOTE — Progress Notes (Signed)
He presents today concerned of a great toenail cracked to the hallux left he states that after his last pedicure portion of the nail peeled off and he was concerned he may have an infection.  Objective: Vital signs are stable he is alert and oriented 3 pulses are palpable. Hallux left does demonstrate what appears to be a nail dystrophy with a portion of the nail having peeled away revealing a small subungual abscess which is currently dried out.  Assessment L dystrophy hallux left.  Plan: I recommended that he continue to have his nails trimmed.

## 2015-07-18 ENCOUNTER — Ambulatory Visit (INDEPENDENT_AMBULATORY_CARE_PROVIDER_SITE_OTHER): Payer: Medicare Other | Admitting: Cardiology

## 2015-07-18 ENCOUNTER — Encounter: Payer: Self-pay | Admitting: Cardiology

## 2015-07-18 VITALS — BP 134/80 | HR 67 | Ht 73.0 in | Wt 181.0 lb

## 2015-07-18 DIAGNOSIS — I10 Essential (primary) hypertension: Secondary | ICD-10-CM

## 2015-07-18 DIAGNOSIS — I471 Supraventricular tachycardia: Secondary | ICD-10-CM | POA: Diagnosis not present

## 2015-07-18 DIAGNOSIS — I350 Nonrheumatic aortic (valve) stenosis: Secondary | ICD-10-CM | POA: Diagnosis not present

## 2015-07-18 DIAGNOSIS — Z01810 Encounter for preprocedural cardiovascular examination: Secondary | ICD-10-CM | POA: Diagnosis not present

## 2015-07-18 NOTE — Patient Instructions (Signed)
Medications NO CHANGE    Procedures Your physician has requested that you have an echocardiogram. Echocardiography is a painless test that uses sound waves to create images of your heart. It provides your doctor with information about the size and shape of your heart and how well your heart's chambers and valves are working. This procedure takes approximately one hour. There are no restrictions for this procedure.    Follow-up in 1 year with Dr. Stanford Breed.  If you need a refill on your cardiac medications before your next appointment, please call your pharmacy.

## 2015-08-01 ENCOUNTER — Other Ambulatory Visit (HOSPITAL_COMMUNITY): Payer: Self-pay

## 2015-08-01 ENCOUNTER — Ambulatory Visit (HOSPITAL_COMMUNITY): Payer: Medicare Other | Attending: Internal Medicine

## 2015-08-01 DIAGNOSIS — I313 Pericardial effusion (noninflammatory): Secondary | ICD-10-CM | POA: Insufficient documentation

## 2015-08-01 DIAGNOSIS — I119 Hypertensive heart disease without heart failure: Secondary | ICD-10-CM | POA: Diagnosis not present

## 2015-08-01 DIAGNOSIS — I350 Nonrheumatic aortic (valve) stenosis: Secondary | ICD-10-CM | POA: Diagnosis not present

## 2015-08-01 DIAGNOSIS — I351 Nonrheumatic aortic (valve) insufficiency: Secondary | ICD-10-CM | POA: Insufficient documentation

## 2015-08-08 DIAGNOSIS — H353221 Exudative age-related macular degeneration, left eye, with active choroidal neovascularization: Secondary | ICD-10-CM | POA: Diagnosis not present

## 2015-09-07 DIAGNOSIS — H353221 Exudative age-related macular degeneration, left eye, with active choroidal neovascularization: Secondary | ICD-10-CM | POA: Diagnosis not present

## 2015-09-13 ENCOUNTER — Ambulatory Visit: Payer: Self-pay | Admitting: General Surgery

## 2015-09-13 NOTE — Progress Notes (Signed)
Pt is being scheduled for preop appt. Please place surgical orders in epic. Thanks.

## 2015-09-14 ENCOUNTER — Encounter (HOSPITAL_COMMUNITY)
Admission: RE | Admit: 2015-09-14 | Discharge: 2015-09-14 | Disposition: A | Discharge status: Home / Self Care | Payer: Medicare Other | Source: Ambulatory Visit | Attending: General Surgery | Admitting: General Surgery

## 2015-09-14 ENCOUNTER — Encounter (HOSPITAL_COMMUNITY): Payer: Self-pay

## 2015-09-14 DIAGNOSIS — Z87891 Personal history of nicotine dependence: Secondary | ICD-10-CM | POA: Diagnosis not present

## 2015-09-14 DIAGNOSIS — Z86010 Personal history of colonic polyps: Secondary | ICD-10-CM | POA: Diagnosis not present

## 2015-09-14 DIAGNOSIS — I10 Essential (primary) hypertension: Secondary | ICD-10-CM | POA: Diagnosis not present

## 2015-09-14 DIAGNOSIS — A630 Anogenital (venereal) warts: Secondary | ICD-10-CM | POA: Diagnosis not present

## 2015-09-14 DIAGNOSIS — K648 Other hemorrhoids: Secondary | ICD-10-CM | POA: Diagnosis not present

## 2015-09-14 DIAGNOSIS — I739 Peripheral vascular disease, unspecified: Secondary | ICD-10-CM | POA: Diagnosis not present

## 2015-09-14 DIAGNOSIS — D013 Carcinoma in situ of anus and anal canal: Secondary | ICD-10-CM | POA: Diagnosis not present

## 2015-09-14 DIAGNOSIS — K644 Residual hemorrhoidal skin tags: Secondary | ICD-10-CM | POA: Diagnosis not present

## 2015-09-14 DIAGNOSIS — K6289 Other specified diseases of anus and rectum: Secondary | ICD-10-CM | POA: Diagnosis present

## 2015-09-14 DIAGNOSIS — K219 Gastro-esophageal reflux disease without esophagitis: Secondary | ICD-10-CM | POA: Diagnosis not present

## 2015-09-14 DIAGNOSIS — Z79899 Other long term (current) drug therapy: Secondary | ICD-10-CM | POA: Diagnosis not present

## 2015-09-14 DIAGNOSIS — Z7951 Long term (current) use of inhaled steroids: Secondary | ICD-10-CM | POA: Diagnosis not present

## 2015-09-14 DIAGNOSIS — M159 Polyosteoarthritis, unspecified: Secondary | ICD-10-CM | POA: Diagnosis not present

## 2015-09-14 DIAGNOSIS — I4892 Unspecified atrial flutter: Secondary | ICD-10-CM | POA: Diagnosis not present

## 2015-09-14 DIAGNOSIS — J449 Chronic obstructive pulmonary disease, unspecified: Secondary | ICD-10-CM | POA: Diagnosis not present

## 2015-09-14 LAB — COMPREHENSIVE METABOLIC PANEL
ALBUMIN: 3.8 g/dL (ref 3.5–5.0)
ALT: 19 U/L (ref 17–63)
AST: 33 U/L (ref 15–41)
Alkaline Phosphatase: 69 U/L (ref 38–126)
Anion gap: 6 (ref 5–15)
BUN: 14 mg/dL (ref 6–20)
CHLORIDE: 99 mmol/L — AB (ref 101–111)
CO2: 27 mmol/L (ref 22–32)
CREATININE: 1.22 mg/dL (ref 0.61–1.24)
Calcium: 9 mg/dL (ref 8.9–10.3)
GFR calc Af Amer: >60 mL/min (ref >60)
GFR calc non Af Amer: 53 mL/min — ABNORMAL LOW (ref >60)
Glucose, Bld: 100 mg/dL — ABNORMAL HIGH (ref 65–99)
Potassium: 4.8 mmol/L (ref 3.5–5.1)
SODIUM: 132 mmol/L — AB (ref 135–145)
Total Bilirubin: 0.3 mg/dL (ref 0.3–1.2)
Total Protein: 7.4 g/dL (ref 6.5–8.1)

## 2015-09-14 LAB — CBC
HCT: 38.2 % — ABNORMAL LOW (ref 39.0–52.0)
Hemoglobin: 13.1 g/dL (ref 13.0–17.0)
MCH: 28.5 pg (ref 26.0–34.0)
MCHC: 34.3 g/dL (ref 30.0–36.0)
MCV: 83.2 fL (ref 78.0–100.0)
PLATELETS: 223 10*3/uL (ref 150–400)
RBC: 4.59 MIL/uL (ref 4.22–5.81)
RDW: 14 % (ref 11.5–15.5)
WBC: 9.7 10*3/uL (ref 4.0–10.5)

## 2015-09-14 NOTE — Patient Instructions (Signed)
LENOXX CASSADA  09/14/2015   Your procedure is scheduled on: Thursday September 15, 2015  Report to [facility] Main  Entrance take [place]  elevators to 3rd floor to  [place] at 12:15 PM.  Call this number if you have problems the morning of surgery [phone]   Remember: ONLY 1 PERSON MAY GO WITH YOU TO SHORT STAY TO GET  READY MORNING OF [place].  Do not eat food After Midnight but may take clear liquids till 8:15am day of surgery then nothing by mouth.      Take these medicines the morning of surgery with A SIP OF WATER: Omeprazole (Prilosec); May use Flonase Nasal Spray if needed                                You may not have any metal on your body including hair pins and              piercings  Do not wear jewelry, lotions, powders or colognes, deodorant                          Men may shave face and neck.   Do not bring valuables to the hospital. [place].  Contacts, dentures or bridgework may not be worn into surgery.      Patients discharged the day of surgery will not be allowed to drive home.  Name and phone number of your driver:Shirley Gervasi (ex wife)  Special Instructions: NO SMOKING OR ALCOHOL USE 24 HOURS PRIOR TO SURGERY               _____________________________________________________________________             [place] [facility] - Preparing for Surgery Before surgery, you can play an important role.  Because skin is not sterile, your skin needs to be as free of germs as possible.  You can reduce the number of germs on your skin by washing with CHG (chlorahexidine gluconate) soap before surgery.  CHG is an antiseptic cleaner which kills germs and bonds with the skin to continue killing germs even after washing. Please DO NOT use if you have an allergy to CHG or antibacterial soaps.  If your skin becomes reddened/irritated stop using the CHG and inform your nurse when you arrive  at Short Stay. Do not shave (including legs and underarms) for at least 48 hours prior to the first CHG shower.  You may shave your face/neck. Please follow these instructions carefully:  1.  Shower with CHG Soap the night before surgery and the  morning of Surgery.  2.  If you choose to wash your hair, wash your hair first as usual with your  normal  shampoo.  3.  After you shampoo, rinse your hair and body thoroughly to remove the  shampoo.                           4.  Use CHG as you would any other liquid soap.  You can apply chg directly  to the skin and wash  Gently with a scrungie or clean washcloth.  5.  Apply the CHG Soap to your body ONLY FROM THE NECK DOWN.   Do not use on face/ open                           Wound or open sores. Avoid contact with eyes, ears mouth and genitals (private parts).                       Wash face,  Genitals (private parts) with your normal soap.             6.  Wash thoroughly, paying special attention to the area where your surgery  will be performed.  7.  Thoroughly rinse your body with warm water from the neck down.  8.  DO NOT shower/wash with your normal soap after using and rinsing off  the CHG Soap.                9.  Pat yourself dry with a clean towel.            10.  Wear clean pajamas.            11.  Place clean sheets on your bed the night of your first shower and do not  sleep with pets. Day of Surgery : Do not apply any lotions/deodorants the morning of surgery.  Please wear clean clothes to the hospital/surgery center.  FAILURE TO FOLLOW THESE INSTRUCTIONS MAY RESULT IN THE CANCELLATION OF YOUR SURGERY PATIENT SIGNATURE_________________________________  NURSE SIGNATURE__________________________________  ________________________________________________________________________    CLEAR LIQUID DIET   Foods Allowed                                                                     Foods Excluded  Coffee and  tea, regular and decaf                             liquids that you cannot  Plain Jell-O in any flavor                                             see through such as: Fruit ices (not with fruit pulp)                                     milk, soups, orange juice  Iced Popsicles                                    All solid food Carbonated beverages, regular and diet                                    Cranberry, grape and apple juices Sports drinks like Gatorade Lightly seasoned clear broth or consume(fat free) Sugar, honey syrup  Sample Menu Breakfast                                Lunch                                     Supper Cranberry juice                    Beef broth                            Chicken broth Jell-O                                     Grape juice                           Apple juice Coffee or tea                        Jell-O                                      Popsicle                                                Coffee or tea                        Coffee or tea  _____________________________________________________________________

## 2015-09-14 NOTE — Progress Notes (Deleted)
HPI: FU atrial tachycardia and aortic stenosis. Patient presented in April of 2014 with dizziness and noted to be in atrial flutter. Cardiac catheterization on 05/06/12 revealed normal coronary arteries. Carotid Dopplers April 2014 showed no significant stenosis. TSH normal. Patient had atrial flutter ablation on 07/08/2012. Admitted with atrial tachycardia 1/15. Declined ablation. Treated with short acting cardizem as he had evidence of tachy brady. Last echocardiogram July 2017 showed normal LV systolic function, grade 1 diastolic dysfunction, mild aortic stenosis, Mean gradient 11 mmHg, moderate biatrial enlargement. Since last seen,   Current Outpatient Prescriptions  Medication Sig Dispense Refill  . clidinium-chlordiazePOXIDE (LIBRAX) 5-2.5 MG capsule Take 1 capsule by mouth 3 (three) times daily as needed. 60 capsule 3  . diltiazem (CARDIZEM) 30 MG tablet Take 1 tablet (30 mg total) by mouth every 6 (six) hours as needed. For palpitations. 30 tablet 2  . fluticasone (FLONASE) 50 MCG/ACT nasal spray Place 2 sprays into both nostrils daily. 48 g 3  . gabapentin (NEURONTIN) 300 MG capsule Take 3 capsules at bedtime. 270 capsule 3  . lisinopril (PRINIVIL,ZESTRIL) 10 MG tablet Take 1 tablet (10 mg total) by mouth daily. 90 tablet 3  . omeprazole (PRILOSEC) 20 MG capsule Take 1 capsule (20 mg total) by mouth daily. Take one hour before breakfast 90 capsule 3  . polyethylene glycol powder (GLYCOLAX/MIRALAX) powder Take 17 grams daily in water or juice 255 g 0   No current facility-administered medications for this visit.      Past Medical History:  Diagnosis Date  . Abnormal EKG    ST changes with normal coronaries by cath 05/06/12.  . Arthritis    "hands" (01/12/2013); feet (podiatry consultation)  . Atrial flutter ([place])    a. Slow ~100bpm when in 2:1; dx 04/2012. b. Placed on Xarelto.  c. s/p ablation 07-08-2012 by Dr Rayann Heman  . Atrial tachycardia ([place])   . Carotid artery calcification      By CXR - dopplers 05/06/12 without obvious evidence of significant ICA stenosis >40%  . Colon polyps 06/26/2005  . COPD (chronic obstructive pulmonary disease) ([place])   . Diverticulosis of colon (without mention of hemorrhage)   . Gastritis, chronic    Pt denies bleeding 04/2012. EGD 2012 reportedly normal.  . GERD (gastroesophageal reflux disease)   . Hypertension   . Hyponatremia    04/2012 r/t diuretic.  . IBS (irritable bowel syndrome)   . Internal and external hemorrhoids without complication   . Macular degeneration of left eye   . 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 Pneumonia    "couple times" (01/12/2013)    Past Surgical History:  Procedure Laterality Date  . ATRIAL FLUTTER ABLATION  07-08-2012   of typical atrial flutter by Dr Rayann Heman  . ATRIAL FLUTTER ABLATION N/A 07/08/2012   Procedure: ATRIAL FLUTTER ABLATION;  Surgeon: Thompson Grayer, MD;  Location: [facility] CATH LAB;  Service: Cardiovascular;  Laterality: N/A;  . CARDIAC CATHETERIZATION  04/2012  . COLONOSCOPY  08/29/10   diverticulosis, internal hemorrhoids  . ESOPHAGOGASTRODUODENOSCOPY  09/20/10   normal  . EYE SURGERY  01/08/2014   Cataract resection L.    [name] [place]  . LEFT HEART CATHETERIZATION WITH CORONARY ANGIOGRAM N/A 05/06/2012   Procedure: LEFT HEART CATHETERIZATION WITH CORONARY ANGIOGRAM;  Surgeon: Peter M Martinique, MD;  Location: [facility] CATH LAB;  Service: Cardiovascular;  Laterality: N/A;  . 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 TRIGGER FINGER RELEASE Right 1970's   "  2 fingers"    Social History   Social History  . Marital status: Divorced    Spouse name: N/A  . Number of children: 2  . Years of education: N/A   Occupational History  . Retired    Social History Main Topics  . Smoking status: Former Smoker    Years: 38.00    Types: Cigarettes, Pipe    Quit date: 01/09/1996  . Smokeless tobacco: Never Used     Comment:    . Alcohol use 0.0 oz/week     Comment: 01/12/2013 "did drink 2-3  12 oz beers a day; stopped drinking in 04/2012"  . Drug use: No  . Sexual activity: No   Other Topics Concern  . Not on file   Social History Narrative   Marital status: divorced; lives with ex-wife; not dating in 2017.      Children:  2 sons, 2 grandsons, 1 granddaughter; no gg.      Lives: with ex-wife in house.      Employment: retired first time age 80; retired in 1997.  Barista x 25 years; Actor.      Tobacco: quit smoking 1997; smoked for 27 years.      Alcohol: quit 2004      Exercise:  Walking daily short distances.       ADLs:  NO assistant devices; has cane if needs it; drives; pays bill; grocery shopping by wife; wife cleans house and does laundry.     Advanced Directives:  +LIVING WILL; +FULL CODE.  HCPOA: oldest son Gemayel Belisle Sr.)       Family History  Problem Relation Age of Onset  . Heart disease Brother   . Diabetes    . Throat cancer Brother   . Other Mother     UNSURE  . Other Father     UNSURE  . Emphysema Sister   . Emphysema Sister   . Other Brother     UNSURE    ROS: no fevers or chills, productive cough, hemoptysis, dysphasia, odynophagia, melena, hematochezia, dysuria, hematuria, rash, seizure activity, orthopnea, PND, pedal edema, claudication. Remaining systems are negative.  Physical Exam: Well-developed well-nourished in no acute distress.  Skin is warm and dry.  HEENT is normal.  Neck is supple.  Chest is clear to auscultation with normal expansion.  Cardiovascular exam is regular rate and rhythm.  Abdominal exam nontender or distended. No masses palpated. Extremities show no edema. neuro grossly intact  ECG

## 2015-09-15 ENCOUNTER — Ambulatory Visit: Payer: Medicare Other | Admitting: Cardiology

## 2015-09-15 ENCOUNTER — Ambulatory Visit (HOSPITAL_COMMUNITY)
Admission: RE | Admit: 2015-09-15 | Discharge: 2015-09-15 | Disposition: A | Discharge status: Home / Self Care | Payer: Medicare Other | Source: Ambulatory Visit | Attending: General Surgery | Admitting: General Surgery

## 2015-09-15 ENCOUNTER — Ambulatory Visit (HOSPITAL_COMMUNITY): Payer: Medicare Other | Admitting: Anesthesiology

## 2015-09-15 ENCOUNTER — Encounter (HOSPITAL_COMMUNITY)
Admission: RE | Disposition: A | Discharge status: Home / Self Care | Payer: Self-pay | Source: Ambulatory Visit | Attending: General Surgery

## 2015-09-15 ENCOUNTER — Encounter (HOSPITAL_COMMUNITY): Payer: Self-pay | Admitting: *Deleted

## 2015-09-15 DIAGNOSIS — Z79899 Other long term (current) drug therapy: Secondary | ICD-10-CM | POA: Insufficient documentation

## 2015-09-15 DIAGNOSIS — Z87891 Personal history of nicotine dependence: Secondary | ICD-10-CM | POA: Diagnosis not present

## 2015-09-15 DIAGNOSIS — Z7951 Long term (current) use of inhaled steroids: Secondary | ICD-10-CM | POA: Diagnosis not present

## 2015-09-15 DIAGNOSIS — I10 Essential (primary) hypertension: Secondary | ICD-10-CM | POA: Diagnosis not present

## 2015-09-15 DIAGNOSIS — D013 Carcinoma in situ of anus and anal canal: Secondary | ICD-10-CM | POA: Diagnosis not present

## 2015-09-15 DIAGNOSIS — Z86010 Personal history of colonic polyps: Secondary | ICD-10-CM | POA: Insufficient documentation

## 2015-09-15 DIAGNOSIS — I129 Hypertensive chronic kidney disease with stage 1 through stage 4 chronic kidney disease, or unspecified chronic kidney disease: Secondary | ICD-10-CM | POA: Diagnosis not present

## 2015-09-15 DIAGNOSIS — M159 Polyosteoarthritis, unspecified: Secondary | ICD-10-CM | POA: Insufficient documentation

## 2015-09-15 DIAGNOSIS — K219 Gastro-esophageal reflux disease without esophagitis: Secondary | ICD-10-CM | POA: Diagnosis not present

## 2015-09-15 DIAGNOSIS — K648 Other hemorrhoids: Secondary | ICD-10-CM | POA: Diagnosis not present

## 2015-09-15 DIAGNOSIS — A630 Anogenital (venereal) warts: Secondary | ICD-10-CM | POA: Diagnosis not present

## 2015-09-15 DIAGNOSIS — I739 Peripheral vascular disease, unspecified: Secondary | ICD-10-CM | POA: Insufficient documentation

## 2015-09-15 DIAGNOSIS — K644 Residual hemorrhoidal skin tags: Secondary | ICD-10-CM | POA: Diagnosis not present

## 2015-09-15 DIAGNOSIS — K6289 Other specified diseases of anus and rectum: Secondary | ICD-10-CM | POA: Diagnosis not present

## 2015-09-15 DIAGNOSIS — I4892 Unspecified atrial flutter: Secondary | ICD-10-CM | POA: Insufficient documentation

## 2015-09-15 DIAGNOSIS — N189 Chronic kidney disease, unspecified: Secondary | ICD-10-CM | POA: Diagnosis not present

## 2015-09-15 DIAGNOSIS — J449 Chronic obstructive pulmonary disease, unspecified: Secondary | ICD-10-CM | POA: Diagnosis not present

## 2015-09-15 HISTORY — PX: WART FULGURATION: SHX5245

## 2015-09-15 SURGERY — FULGURATION, WART, ANUS
Anesthesia: General | Site: Anus

## 2015-09-15 MED ORDER — BUPIVACAINE-EPINEPHRINE 0.5% -1:200000 IJ SOLN
INTRAMUSCULAR | Status: DC | PRN
  Administered 2015-09-15: 5 mL

## 2015-09-15 MED ORDER — EPHEDRINE 5 MG/ML INJ
INTRAVENOUS | Status: AC
  Filled 2015-09-15: qty 10

## 2015-09-15 MED ORDER — 0.9 % SODIUM CHLORIDE (POUR BTL) OPTIME
TOPICAL | Status: DC | PRN
  Administered 2015-09-15: 1000 mL

## 2015-09-15 MED ORDER — ROCURONIUM BROMIDE 10 MG/ML (PF) SYRINGE
PREFILLED_SYRINGE | INTRAVENOUS | Status: DC | PRN
  Administered 2015-09-15: 40 mg via INTRAVENOUS

## 2015-09-15 MED ORDER — ONDANSETRON HCL 4 MG/2ML IJ SOLN
INTRAMUSCULAR | Status: AC
  Filled 2015-09-15: qty 2

## 2015-09-15 MED ORDER — DEXAMETHASONE SODIUM PHOSPHATE 10 MG/ML IJ SOLN
INTRAMUSCULAR | Status: DC | PRN
  Administered 2015-09-15: 10 mg via INTRAVENOUS

## 2015-09-15 MED ORDER — SODIUM CHLORIDE 0.9% FLUSH: 3.0000 mL | Freq: Two times a day (BID) | INTRAVENOUS | Status: DC

## 2015-09-15 MED ORDER — OXYCODONE HCL 5 MG PO TABS: 5.0000 mg | ORAL_TABLET | ORAL | Status: DC | PRN

## 2015-09-15 MED ORDER — CEFAZOLIN SODIUM-DEXTROSE 2-4 GM/100ML-% IV SOLN
2.0000 g | INTRAVENOUS | Status: AC
  Administered 2015-09-15: 2 g via INTRAVENOUS
  Filled 2015-09-15: qty 100

## 2015-09-15 MED ORDER — CHLORHEXIDINE GLUCONATE 4 % EX LIQD: 60.0000 mL | Freq: Once | CUTANEOUS | Status: DC

## 2015-09-15 MED ORDER — FENTANYL CITRATE (PF) 100 MCG/2ML IJ SOLN
INTRAMUSCULAR | Status: DC | PRN
  Administered 2015-09-15: 50 ug via INTRAVENOUS

## 2015-09-15 MED ORDER — SODIUM CHLORIDE 0.9 % IV SOLN: 250.0000 mL | INTRAVENOUS | Status: DC | PRN

## 2015-09-15 MED ORDER — ONDANSETRON HCL 4 MG/2ML IJ SOLN
INTRAMUSCULAR | Status: DC | PRN
  Administered 2015-09-15: 4 mg via INTRAVENOUS

## 2015-09-15 MED ORDER — SUGAMMADEX SODIUM 200 MG/2ML IV SOLN
INTRAVENOUS | Status: DC | PRN
  Administered 2015-09-15: 200 mg via INTRAVENOUS

## 2015-09-15 MED ORDER — SODIUM CHLORIDE 0.9% FLUSH
3.0000 mL | INTRAVENOUS | Status: DC | PRN
Start: 2015-09-15 — End: 2015-09-15

## 2015-09-15 MED ORDER — ACETAMINOPHEN 500 MG PO TABS: 1000.0000 mg | ORAL_TABLET | Freq: Four times a day (QID) | ORAL | Status: DC

## 2015-09-15 MED ORDER — ACETAMINOPHEN 325 MG PO TABS: 650.0000 mg | ORAL_TABLET | ORAL | Status: DC | PRN

## 2015-09-15 MED ORDER — ACETAMINOPHEN 650 MG RE SUPP: 650.0000 mg | RECTAL | Status: DC | PRN

## 2015-09-15 MED ORDER — FENTANYL CITRATE (PF) 100 MCG/2ML IJ SOLN
INTRAMUSCULAR | Status: AC
  Filled 2015-09-15: qty 2

## 2015-09-15 MED ORDER — EPHEDRINE SULFATE-NACL 50-0.9 MG/10ML-% IV SOSY
PREFILLED_SYRINGE | INTRAVENOUS | Status: DC | PRN
  Administered 2015-09-15 (×2): 5 mg via INTRAVENOUS

## 2015-09-15 MED ORDER — SUGAMMADEX SODIUM 200 MG/2ML IV SOLN
INTRAVENOUS | Status: AC
  Filled 2015-09-15: qty 2

## 2015-09-15 MED ORDER — CEFAZOLIN SODIUM-DEXTROSE 2-4 GM/100ML-% IV SOLN
INTRAVENOUS | Status: AC
  Filled 2015-09-15: qty 100

## 2015-09-15 MED ORDER — BUPIVACAINE-EPINEPHRINE (PF) 0.5% -1:200000 IJ SOLN
INTRAMUSCULAR | Status: AC
  Filled 2015-09-15: qty 30

## 2015-09-15 MED ORDER — DEXAMETHASONE SODIUM PHOSPHATE 10 MG/ML IJ SOLN
INTRAMUSCULAR | Status: AC
  Filled 2015-09-15: qty 1

## 2015-09-15 MED ORDER — LACTATED RINGERS IV SOLN
INTRAVENOUS | Status: DC
  Administered 2015-09-15: 13:00:00 via INTRAVENOUS

## 2015-09-15 MED ORDER — BUPIVACAINE LIPOSOME 1.3 % IJ SUSP
20.0000 mL | Freq: Once | INTRAMUSCULAR | Status: AC
  Administered 2015-09-15: 20 mL
  Filled 2015-09-15: qty 20

## 2015-09-15 MED ORDER — OXYCODONE HCL 5 MG PO TABS: 2.5000 mg | ORAL_TABLET | ORAL | 0 refills | Status: DC | PRN

## 2015-09-15 MED ORDER — PROPOFOL 10 MG/ML IV BOLUS
INTRAVENOUS | Status: DC | PRN
  Administered 2015-09-15: 100 mg via INTRAVENOUS

## 2015-09-15 MED ORDER — PROPOFOL 10 MG/ML IV BOLUS
INTRAVENOUS | Status: AC
  Filled 2015-09-15: qty 20

## 2015-09-15 MED ORDER — LIDOCAINE 2% (20 MG/ML) 5 ML SYRINGE
INTRAMUSCULAR | Status: AC
  Filled 2015-09-15: qty 5

## 2015-09-15 MED ORDER — LIDOCAINE 2% (20 MG/ML) 5 ML SYRINGE
INTRAMUSCULAR | Status: DC | PRN
  Administered 2015-09-15: 100 mg via INTRAVENOUS

## 2015-09-15 SURGICAL SUPPLY — 38 items
BLADE SURG 15 STRL LF DISP TIS (BLADE) ×2 IMPLANT
BLADE SURG 15 STRL SS (BLADE) ×4
BRIEF STRETCH FOR OB PAD LRG (UNDERPADS AND DIAPERS) ×4 IMPLANT
COVER SURGICAL LIGHT HANDLE (MISCELLANEOUS) ×4 IMPLANT
DECANTER SPIKE VIAL GLASS SM (MISCELLANEOUS) ×4 IMPLANT
DRAPE LAPAROTOMY T 102X78X121 (DRAPES) IMPLANT
DRAPE LAPAROTOMY T 98X78 PEDS (DRAPES) IMPLANT
DRAPE SHEET LG 3/4 BI-LAMINATE (DRAPES) IMPLANT
DRAPE UTILITY XL STRL (DRAPES) ×4 IMPLANT
ELECT NDL TIP 2.8 STRL (NEEDLE) IMPLANT
ELECT NEEDLE TIP 2.8 STRL (NEEDLE) IMPLANT
ELECT PENCIL ROCKER SW 15FT (MISCELLANEOUS) ×4 IMPLANT
ELECT REM PT RETURN 9FT ADLT (ELECTROSURGICAL) ×4
ELECTRODE REM PT RTRN 9FT ADLT (ELECTROSURGICAL) ×2 IMPLANT
GAUZE SPONGE 4X4 12PLY STRL (GAUZE/BANDAGES/DRESSINGS) IMPLANT
GAUZE SPONGE 4X4 16PLY XRAY LF (GAUZE/BANDAGES/DRESSINGS) ×4 IMPLANT
GLOVE BIO SURGEON STRL SZ7.5 (GLOVE) ×4 IMPLANT
GLOVE BIOGEL PI IND STRL 7.0 (GLOVE) ×2 IMPLANT
GLOVE BIOGEL PI INDICATOR 7.0 (GLOVE) ×2
GLOVE INDICATOR 8.0 STRL GRN (GLOVE) ×4 IMPLANT
GOWN STRL REUS W/TWL XL LVL3 (GOWN DISPOSABLE) ×8 IMPLANT
KIT BASIN OR (CUSTOM PROCEDURE TRAY) ×4 IMPLANT
LUBRICANT JELLY K Y 4OZ (MISCELLANEOUS) ×4 IMPLANT
NDL HYPO 25X1 1.5 SAFETY (NEEDLE) ×2 IMPLANT
NDL SAFETY ECLIPSE 18X1.5 (NEEDLE) IMPLANT
NEEDLE HYPO 18GX1.5 SHARP (NEEDLE)
NEEDLE HYPO 25X1 1.5 SAFETY (NEEDLE) ×4 IMPLANT
NS IRRIG 1000ML POUR BTL (IV SOLUTION) ×4 IMPLANT
PACK BASIC VI WITH GOWN DISP (CUSTOM PROCEDURE TRAY) ×4 IMPLANT
SHEARS HARMONIC 9CM CVD (BLADE) ×2 IMPLANT
SPONGE SURGIFOAM ABS GEL 100 (HEMOSTASIS) ×2 IMPLANT
SUT CHROMIC 2 0 SH (SUTURE) IMPLANT
SUT CHROMIC 3 0 SH 27 (SUTURE) IMPLANT
SUT SILK 2 0 SH (SUTURE) ×2 IMPLANT
SYR CONTROL 10ML LL (SYRINGE) ×4 IMPLANT
TOWEL OR 17X26 10 PK STRL BLUE (TOWEL DISPOSABLE) ×4 IMPLANT
TOWEL OR NON WOVEN STRL DISP B (DISPOSABLE) ×4 IMPLANT
YANKAUER SUCT BULB TIP 10FT TU (MISCELLANEOUS) ×4 IMPLANT

## 2015-09-15 NOTE — Anesthesia Procedure Notes (Signed)
Procedure Name: Intubation Date/Time: 09/15/2015 2:22 PM Performed by: Dione Booze Pre-anesthesia Checklist: Patient identified, Emergency Drugs available, Suction available and Patient being monitored Patient Re-evaluated:Patient Re-evaluated prior to inductionOxygen Delivery Method: Circle system utilized Preoxygenation: Pre-oxygenation with 100% oxygen Intubation Type: IV induction Laryngoscope Size: Mac and 4 Grade View: Grade I Tube type: Oral Tube size: 7.5 mm Number of attempts: 1 Airway Equipment and Method: Stylet Placement Confirmation: ETT inserted through vocal cords under direct vision,  positive ETCO2 and breath sounds checked- equal and bilateral Secured at: 22 cm Tube secured with: Tape Dental Injury: Teeth and Oropharynx as per pre-operative assessment

## 2015-09-15 NOTE — Discharge Instructions (Addendum)
General Anesthesia, Adult, Care After Refer to this sheet in the next few weeks. These instructions provide you with information on caring for yourself after your procedure. Your health care provider may also give you more specific instructions. Your treatment has been planned according to current medical practices, but problems sometimes occur. Call your health care provider if you have any problems or questions after your procedure. WHAT TO EXPECT AFTER THE PROCEDURE After the procedure, it is typical to experience:  Sleepiness.  Nausea and vomiting. HOME CARE INSTRUCTIONS  For the first 24 hours after general anesthesia:  Have a responsible person with you.  Do not drive a car. If you are alone, do not take public transportation.  Do not drink alcohol.  Do not take medicine that has not been prescribed by your health care provider.  Do not sign important papers or make important decisions.  You may resume a normal diet and activities as directed by your health care provider.  Change bandages (dressings) as directed.  If you have questions or problems that seem related to general anesthesia, call the hospital and ask for the anesthetist or anesthesiologist on call. SEEK MEDICAL CARE IF:  You have nausea and vomiting that continue the day after anesthesia.  You develop a rash. SEEK IMMEDIATE MEDICAL CARE IF:   You have difficulty breathing.  You have chest pain.  You have any allergic problems.   This information is not intended to replace advice given to you by your health care provider. Make sure you discuss any questions you have with your health care provider.   Document Released: 04/02/2000 Document Revised: 01/15/2014 Document Reviewed: 04/25/2011 Elsevier Interactive Patient Education 2016 [place]:  POST OPERATIVE INSTRUCTIONS  ######################################################################  EAT Gradually transition to a  high fiber diet with a fiber supplement over the next few weeks after discharge.  Start with a pureed / full liquid diet (see below)  WALK Walk an hour a day.  Control your pain to do that.    CONTROL PAIN Control pain so that you can walk, sleep, tolerate sneezing/coughing, go up/down stairs.  HAVE A BOWEL MOVEMENT DAILY Keep your bowels regular to avoid problems.  OK to try a laxative to override constipation.  OK to use an antidairrheal to slow down diarrhea.  Call if not better after 2 tries  CALL IF YOU HAVE PROBLEMS/CONCERNS Call if you are still struggling despite following these instructions. Call if you have concerns not answered by these instructions  ######################################################################    1. Take your usually prescribed home medications unless otherwise directed. 2. DIET: Follow a light bland diet the first 24 hours after arrival home, such as soup, liquids, crackers, etc.  Be sure to include lots of fluids daily.  Avoid fast food or heavy meals as your are more likely to get nauseated.  Eat a low fat the next few days after surgery.   3. PAIN CONTROL: a. Pain is best controlled by a usual combination of three different methods TOGETHER: i. Ice/Heat ii. Over the counter pain medication iii. Prescription pain medication b. Most patients will experience some swelling and discomfort in the anus/rectal area. and incisions.  Ice packs or heat (30-60 minutes up to 6 times a day) will help. Use ice for the first few days to help decrease swelling and bruising, then switch to heat such as warm towels, sitz baths, warm baths, etc to help relax tight/sore spots and speed recovery.  Some people prefer to  use ice alone, heat alone, alternating between ice & heat.  Experiment to what works for you.  Swelling and bruising can take several weeks to resolve.   c. It is helpful to take an over-the-counter pain medication regularly for the first few weeks.   Choose one of the following that works best for you: i. Naproxen (Aleve, etc)  Two 220mg  tabs twice a day ii. Ibuprofen (Advil, etc) Three 200mg  tabs four times a day (every meal & bedtime) iii. Acetaminophen (Tylenol, etc) 500-650mg  four times a day (every meal & bedtime) d. A  prescription for pain medication (such as oxycodone, hydrocodone, etc) should be given to you upon discharge.  Take your pain medication as prescribed.  i. If you are having problems/concerns with the prescription medicine (does not control pain, nausea, vomiting, rash, itching, etc), please call us [phone] to see if we need to switch you to a different pain medicine that will work better for you and/or control your side effect better. ii. If you need a refill on your pain medication, please contact your pharmacy.  They will contact our office to request authorization. Prescriptions will not be filled after 5 pm or on week-ends.  Use a Sitz Bath 4-8 times a day for relief   [employer] A sitz bath is a warm water bath taken in the sitting position that covers only the hips and buttocks. It may be used for either healing or hygiene purposes. Sitz baths are also used to relieve pain, itching, or muscle spasms. The water may contain medicine. Moist heat will help you heal and relax.  HOME CARE INSTRUCTIONS  Take 3 to 4 sitz baths a day. 1. Fill the bathtub half full with warm water. 2. Sit in the water and open the drain a little. 3. Turn on the warm water to keep the tub half full. Keep the water running constantly. 4. Soak in the water for 15 to 20 minutes. 5. After the sitz bath, pat the affected area dry first.   4. KEEP YOUR BOWELS REGULAR a. The goal is one bowel movement a day b. Avoid getting constipated.  Between the surgery and the pain medications, it is common to experience some constipation.  Increasing fluid intake and taking a fiber supplement (such as Metamucil, Citrucel, FiberCon, MiraLax, etc) 1-2  times a day regularly will usually help prevent this problem from occurring.  A mild laxative (prune juice, Milk of Magnesia, MiraLax, etc) should be taken according to package directions if there are no bowel movements after 48 hours. c. Watch out for diarrhea.  If you have many loose bowel movements, simplify your diet to bland foods & liquids for a few days.  Stop any stool softeners and decrease your fiber supplement.  Switching to mild anti-diarrheal medications (Kayopectate, Pepto Bismol) can help.  If this worsens or does not improve, please call us.  5. Wound Care  a. Remove your bandages the day after surgery.  Unless discharge instructions indicate otherwise, leave your bandage dry and in place overnight.  Remove the bandage during your first bowel movement.   b. Wear an absorbent pad or soft cotton gauze in your underwear as needed to catch any drainage and help keep the area  c. Keep the area clean and dry.  Bathe / shower every day.  Keep the area clean by showering / bathing over the incision / wound.   It is okay to soak an open wound to help wash it.  Wet wipes or showers / gentle washing after bowel movements is often less traumatic than regular toilet paper. d. Dennis Bast will often notice bleeding with bowel movements.  This should slow down by the end of the first week of surgery e. Expect some drainage.  This should slow down, too, by the end of the first week of surgery.  Wear an absorbent pad or soft cotton gauze in your underwear until the drainage stops.  6. ACTIVITIES as tolerated:   a. You may resume regular (light) daily activities beginning the next day--such as daily self-care, walking, climbing stairs--gradually increasing activities as tolerated.  If you can walk 30 minutes without difficulty, it is safe to try more intense activity such as jogging, treadmill, bicycling, low-impact aerobics, swimming, etc. b. Save the most intensive and strenuous activity for last such as  sit-ups, heavy lifting, contact sports, etc  Refrain from any heavy lifting or straining until you are off narcotics for pain control.   c. DO NOT PUSH THROUGH PAIN.  Let pain be your guide: If it hurts to do something, don't do it.  Pain is your body warning you to avoid that activity for another week until the pain goes down. d. You may drive when you are no longer taking prescription pain medication, you can comfortably sit for long periods of time, and you can safely maneuver your car and apply brakes. e. Dennis Bast may have sexual intercourse when it is comfortable.  7. FOLLOW UP in our office a. Please call [facility] at [phone] to set up an appointment to see your surgeon in the office for a follow-up appointment approximately 2 weeks after your surgery. b. Make sure that you call for this appointment the day you arrive home to insure a convenient appointment time. 10. IF YOU HAVE DISABILITY OR FAMILY LEAVE FORMS, BRING THEM TO THE OFFICE FOR PROCESSING.  DO NOT GIVE THEM TO YOUR DOCTOR.        WHEN TO CALL us [phone]: 1. Poor pain control 2. Reactions / problems with new medications (rash/itching, nausea, etc)  3. Fever over 101.5 F (38.5 C) 4. Inability to urinate 5. Nausea and/or vomiting 6. Worsening swelling or bruising 7. Continued bleeding from incision. 8. Increased pain, redness, or drainage from the incision  The clinic staff is available to answer your questions during regular business hours (8:30am-5pm).  Please dont hesitate to call and ask to speak to one of our nurses for clinical concerns.   A surgeon from [facility] is always on call at the hospitals   If you have a medical emergency, go to the nearest emergency room or call 911.    [facility], [place], [place], [place], [place]  [address] ? MAIN: [phone] ? TOLL FREE: [phone] ? FAX [phone] [email or web address]

## 2015-09-15 NOTE — Transfer of Care (Signed)
Immediate Anesthesia Transfer of Care Note  Patient: Chad [name]  Procedure(s) Performed: Procedure(s): EXCISIONal biospy of left peri anual and anual canal mass (Left) RECTAL EXAM UNDER ANESTHESIA (N/A)  Patient Location: PACU  Anesthesia Type:General  Level of Consciousness: awake, alert  and patient cooperative  Airway & Oxygen Therapy: Patient Spontanous Breathing and Patient connected to face mask oxygen  Post-op Assessment: Report given to RN and Post -op Vital signs reviewed and stable  Post vital signs: Reviewed and stable  Last Vitals:  Vitals:   09/15/15 1136  BP: (!) 155/80  Pulse: 89  Resp: 18  Temp: 36.5 C    Last Pain:  Vitals:   09/15/15 1136  TempSrc: Oral      Patients Stated Pain Goal: 3 ([id number])  Complications: No apparent anesthesia complications

## 2015-09-15 NOTE — Anesthesia Postprocedure Evaluation (Signed)
Anesthesia Post Note  Patient: Chad [name]  Procedure(s) Performed: Procedure(s) (LRB): EXCISIONal biospy of left peri anual and anual canal mass (Left) RECTAL EXAM UNDER ANESTHESIA (N/A)  Patient location during evaluation: PACU Anesthesia Type: General Level of consciousness: awake and alert Pain management: pain level controlled Vital Signs Assessment: post-procedure vital signs reviewed and stable Respiratory status: spontaneous breathing, nonlabored ventilation, respiratory function stable and patient connected to nasal cannula oxygen Cardiovascular status: blood pressure returned to baseline and stable Postop Assessment: no signs of nausea or vomiting Anesthetic complications: no    Last Vitals:  Vitals:   09/15/15 1537 09/15/15 1545  BP: (!) 151/76 (!) 162/85  Pulse: 75 72  Resp: 17 12  Temp:  36.4 C    Last Pain:  Vitals:   09/15/15 1558  TempSrc:   PainSc: 0-No pain                 Zenaida Deed

## 2015-09-15 NOTE — H&P (Signed)
Chad [name] is an 82 y.o. male.   Chief Complaint: here for surgery HPI: The patient is a 82 year old male who presents with a complaint of anal problems. He is referred by Dr Reginia Forts for evaluation of perianal lesion. He states that he has had a hemorrhoid for about 20 years. His main complaint that he went to see his primary care for excessive gas. He states that generally after eating a meal he will sometimes have an urgency to have a bowel movement. Sometimes he'll pass flatus or sometimes have a bowel movement. He denies any incontinence. He has daily bowel movements. He does tend to read while sitting on the commode. He drinks about 24 ounces of water daily. He does not take any supplemental fiber. However he does eat fiber one cereal. Cardiac review of systems is negative except for murmur. He denies any palpitations, chest pain, shortness of breath, dyspnea on exertion, orthopnea. He states that he is not sexually active. He states that he has not been active for many years. He denies any weight loss. He denies any fever or chills.  Past Medical History:  Diagnosis Date  . Abnormal EKG    ST changes with normal coronaries by cath 05/06/12.  . Arthritis    "hands" (01/12/2013); feet (podiatry consultation)  . Atrial flutter ([place])    a. Slow ~100bpm when in 2:1; dx 04/2012. b. Placed on Xarelto.  c. s/p ablation 07-08-2012 by Dr Rayann Heman  . Atrial tachycardia ([place])   . Carotid artery calcification    By CXR - dopplers 05/06/12 without obvious evidence of significant ICA stenosis >40%  . Colon polyps 06/26/2005  . COPD (chronic obstructive pulmonary disease) ([place])   . Diverticulosis of colon (without mention of hemorrhage)   . Dysrhythmia   . Gastritis, chronic    Pt denies bleeding 04/2012. EGD 2012 reportedly normal.  . GERD (gastroesophageal reflux disease)   . Hypertension   . Hyponatremia    04/2012 r/t diuretic.  . IBS (irritable bowel syndrome)   . Internal and  external hemorrhoids without complication   . Macular degeneration of left eye   . Oral cancer ([place]) 02/1997  . Pneumonia    "couple times" (01/12/2013)  . Shortness of breath dyspnea    walking distances    Past Surgical History:  Procedure Laterality Date  . ATRIAL FLUTTER ABLATION  07-08-2012   of typical atrial flutter by Dr Rayann Heman  . ATRIAL FLUTTER ABLATION N/A 07/08/2012   Procedure: ATRIAL FLUTTER ABLATION;  Surgeon: Thompson Grayer, MD;  Location: [facility] CATH LAB;  Service: Cardiovascular;  Laterality: N/A;  . CARDIAC CATHETERIZATION  04/2012  . COLONOSCOPY  08/29/10   diverticulosis, internal hemorrhoids  . ESOPHAGOGASTRODUODENOSCOPY  09/20/10   normal  . EYE SURGERY  01/08/2014   Cataract resection L.    [name] [place]  . LEFT HEART CATHETERIZATION WITH CORONARY ANGIOGRAM N/A 05/06/2012   Procedure: LEFT HEART CATHETERIZATION WITH CORONARY ANGIOGRAM;  Surgeon: Peter M Martinique, MD;  Location: [facility] CATH LAB;  Service: Cardiovascular;  Laterality: N/A;  . 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 TRIGGER FINGER RELEASE Right 1970's   "2 fingers"    Family History  Problem Relation Age of Onset  . Heart disease Brother   . Throat cancer Brother   . Other Mother     UNSURE  . Other Father     UNSURE  . Emphysema Sister   . Emphysema Sister   .  Other Brother     UNSURE  . Diabetes     Social History:  reports that he quit smoking about 49 years ago. His smoking use included Cigarettes, Pipe, and Cigars. He has a 10.00 pack-year smoking history. He has never used smokeless tobacco. He reports that he drinks alcohol. He reports that he does not use drugs.  Allergies:  Allergies  Allergen Reactions  . Dyazide [Hydrochlorothiazide W-Triamterene] Other (See Comments)    Caused hyponatremia 04/2012    Medications Prior to Admission  Medication Sig Dispense Refill  . diltiazem (CARDIZEM) 30 MG tablet Take 1 tablet (30 mg total) by mouth every 6 (six) hours as  needed. For palpitations. 30 tablet 2  . fluticasone (FLONASE) 50 MCG/ACT nasal spray Place 2 sprays into both nostrils daily. 48 g 3  . gabapentin (NEURONTIN) 300 MG capsule Take 3 capsules at bedtime. 270 capsule 3  . lisinopril (PRINIVIL,ZESTRIL) 10 MG tablet Take 1 tablet (10 mg total) by mouth daily. 90 tablet 3  . multivitamin-lutein (OCUVITE-LUTEIN) CAPS capsule Take 1 capsule by mouth daily.    [name] omeprazole (PRILOSEC) 20 MG capsule Take 1 capsule (20 mg total) by mouth daily. Take one hour before breakfast 90 capsule 3  . clidinium-chlordiazePOXIDE (LIBRAX) 5-2.5 MG capsule Take 1 capsule by mouth 3 (three) times daily as needed. (Patient not taking: Reported on 09/14/2015) 60 capsule 3  . polyethylene glycol powder (GLYCOLAX/MIRALAX) powder Take 17 grams daily in water or juice (Patient not taking: Reported on 09/14/2015) 255 g 0    Results for orders placed or performed during the hospital encounter of 09/14/15 (from the past 48 hour(s))  Comprehensive metabolic panel     Status: Abnormal   Collection Time: 09/14/15 11:55 AM  Result Value Ref Range   Sodium 132 (L) 135 - 145 mmol/L   Potassium 4.8 3.5 - 5.1 mmol/L   Chloride 99 (L) 101 - 111 mmol/L   CO2 27 22 - 32 mmol/L   Glucose, Bld 100 (H) 65 - 99 mg/dL   BUN 14 6 - 20 mg/dL   Creatinine, Ser 1.22 0.61 - 1.24 mg/dL   Calcium 9.0 8.9 - 10.3 mg/dL   Total Protein 7.4 6.5 - 8.1 g/dL   Albumin 3.8 3.5 - 5.0 g/dL   AST 33 15 - 41 U/L   ALT 19 17 - 63 U/L   Alkaline Phosphatase 69 38 - 126 U/L   Total Bilirubin 0.3 0.3 - 1.2 mg/dL   GFR calc non Af Amer 53 (L) >60 mL/min   GFR calc Af Amer >60 >60 mL/min    Comment: (NOTE) The eGFR has been calculated using the CKD EPI equation. This calculation has not been validated in all clinical situations. eGFR's persistently <60 mL/min signify possible Chronic Kidney Disease.    Anion gap 6 5 - 15  CBC     Status: Abnormal   Collection Time: 09/14/15 11:55 AM  Result Value Ref Range    WBC 9.7 4.0 - 10.5 K/uL   RBC 4.59 4.22 - 5.81 MIL/uL   Hemoglobin 13.1 13.0 - 17.0 g/dL   HCT 38.2 (L) 39.0 - 52.0 %   MCV 83.2 78.0 - 100.0 fL   MCH 28.5 26.0 - 34.0 pg   MCHC 34.3 30.0 - 36.0 g/dL   RDW 14.0 11.5 - 15.5 %   Platelets 223 150 - 400 K/uL   No results found.  Review of Systems  Constitutional: Negative for weight loss.  HENT: Negative for  nosebleeds.   Eyes: Negative for blurred vision.  Respiratory: Negative for shortness of breath.   Cardiovascular: Negative for chest pain, palpitations, orthopnea and PND.       Denies DOE  Genitourinary: Negative for dysuria and hematuria.  Musculoskeletal: Negative.   Skin: Negative for itching and rash.  Neurological: Negative for dizziness, focal weakness, seizures, loss of consciousness and headaches.       Denies TIAs, amaurosis fugax  Endo/Heme/Allergies: Does not bruise/bleed easily.  Psychiatric/Behavioral: The patient is not nervous/anxious.     Blood pressure (!) 155/80, pulse 89, temperature 97.7 F (36.5 C), temperature source Oral, resp. rate 18, height 6' 1" (1.854 m), weight 80.7 kg (178 lb), SpO2 96 %. Physical Exam  Vitals reviewed. Constitutional: He is oriented to person, place, and time. He appears well-developed and well-nourished. No distress.  HENT:  Head: Normocephalic and atraumatic.  Right Ear: External ear normal.  Left Ear: External ear normal.  Eyes: Conjunctivae are normal. No scleral icterus.  Neck: Normal range of motion. Neck supple. No tracheal deviation present. No thyromegaly present.  Cardiovascular: Normal rate.   Murmur heard. Respiratory: Effort normal and breath sounds normal. No stridor. No respiratory distress. He has no wheezes.  Genitourinary:  Genitourinary Comments: Perianal mass, chronic atypical hemorrhoid vs large wart;   Musculoskeletal: He exhibits no edema or tenderness.  Lymphadenopathy:    He has no cervical adenopathy.  Neurological: He is alert and oriented  to person, place, and time. He exhibits normal muscle tone.  Skin: Skin is warm and dry. No rash noted. He is not diaphoretic. No erythema. No pallor.  Psychiatric: He has a normal mood and affect. His behavior is normal. Judgment and thought content normal.     Assessment/Plan Perianal mass  Chronic prolapsed hemorrhoid vs large wart A flutter  To OR for excision No changes since clinic appt No addl questions Discussed postop care with pt.   Leighton Ruff. Redmond Pulling, MD, [place], Bariatric, & Minimally Invasive Surgery [facility], [place]   Gayland Curry, MD 09/15/2015, 1:40 PM

## 2015-09-15 NOTE — Anesthesia Preprocedure Evaluation (Addendum)
Anesthesia Evaluation  Patient identified by MRN, date of birth, ID band Patient awake    Reviewed: Allergy & Precautions, H&P , NPO status , Patient's Chart, lab work & pertinent test results, reviewed documented beta blocker date and time   History of Anesthesia Complications Negative for: history of anesthetic complications  Airway Mallampati: II       Dental  (+) Edentulous Upper   Pulmonary COPD, former smoker,    breath sounds clear to auscultation       Cardiovascular hypertension, Pt. on medications and Pt. on home beta blockers + Peripheral Vascular Disease  + dysrhythmias Atrial Fibrillation  Rhythm:Regular Rate:Normal  normal LV systolic function nl coronaries by cath 4/14   Neuro/Psych negative neurological ROS     GI/Hepatic Neg liver ROS, GERD  ,  Endo/Other  negative endocrine ROS  Renal/GU Renal disease     Musculoskeletal  (+) Arthritis ,   Abdominal   Peds  Hematology negative hematology ROS (+)   Anesthesia Other Findings   Reproductive/Obstetrics negative OB ROS                            Anesthesia Physical  Anesthesia Plan  ASA: III  Anesthesia Plan: General   Post-op Pain Management:    Induction: Intravenous  Airway Management Planned: Oral ETT  Additional Equipment:   Intra-op Plan:   Post-operative Plan:   Informed Consent: I have reviewed the patients History and Physical, chart, labs and discussed the procedure including the risks, benefits and alternatives for the proposed anesthesia with the patient or authorized representative who has indicated his/her understanding and acceptance.     Plan Discussed with: CRNA and Anesthesiologist  Anesthesia Plan Comments: ( H/O Ca tongue, S/P neck dissection 1998 )       Anesthesia Quick Evaluation

## 2015-09-15 NOTE — Op Note (Signed)
09/15/2015  3:03 PM  PATIENT:  Chad [name]  82 y.o. male  PRE-OPERATIVE DIAGNOSIS:  Perianal mass  POST-OPERATIVE DIAGNOSIS:  Left anterior anal canal-perianal mass  PROCEDURE:  Procedure(s): EXCISIONal biospy of left peri anal and anal canal mass RECTAL EXAM UNDER ANESTHESIA  SURGEON:  Surgeon(s): Greer Pickerel, MD  ASSISTANTS: none   ANESTHESIA:   general and exparel  DRAINS: none   LOCAL MEDICATIONS USED:  OTHER 20 cc exparel  SPECIMEN:  Source of Specimen:  Left anterior perianal-anal canal mass, stitch marks dentate margin  DISPOSITION OF SPECIMEN:  PATHOLOGY  COUNTS:  YES  INDICATION FOR PROCEDURE: 82 year old gentleman with a long-standing reported history of hemorrhoid. In the office lesion was in the typical location of a hemorrhoid however the surface of the lesion was villous and wartlike in appearance. There is no other skin lesions. I recommended removal. Please see my office note for additional detail.  PROCEDURE: After obtaining informed consent the patient was taken to the operating room at [facility]. General endotracheal anesthesia was established. He was then placed in the prone position with the appropriate padding. Sequential compression devices had been placed. His buttocks were taped apart. The perianal, anal area was prepped with Betadine and then draped in the usual standard surgical fashion. Because of his age and comorbidities he did get IV antibiotic prior to skin incision. A surgical timeout was performed. Visual inspection revealed an exophytic mass coming out of the anus in the left anterior lateral position. He was in the location of the typical hemorrhoid. It extended within the anal canal. Anoscopy demonstrated no additional hemorrhoidal burden or other evidence of skin lesions consistent with warts. The top of this lesion was villous and almost wartlike in appearance therefore appropriate suction devices were used along with appropriate  mask for potential condyloma. Local was infiltrated directly underneath the perianal skin for hemostasis. I then made an elliptical V-shaped incision on the perianal skin with a 15 blade. Then using a 5 hemostat I lifted the mass up off the underlying external sphincter. It was then elliptically excised with harmonic scalpel. The length of the lesion was 3 cm. A stitch was used to mark the more proximal margin. Hemostasis was achieved with Bovie electrocautery. I then infiltrated exparel in a regional fashion. Hemostasis had been achieved. A rolled piece of Gelfoam was inserted into the anus. Fluffs and 4 x 4's and mesh underwear were then placed. The patient was placed in the supine position and then extubated. All needle, instrument and sponge counts were correct 2. There are no immediate complications. The patient tolerated the procedure well.  PLAN OF CARE: Discharge to home after PACU  PATIENT DISPOSITION:  PACU - hemodynamically stable.   Delay start of Pharmacological VTE agent (>24hrs) due to surgical blood loss or risk of bleeding:  no  Leighton Ruff. Redmond Pulling, MD, FACS General, Bariatric, & Minimally Invasive Surgery [facility] Surgery, [place]

## 2015-09-16 ENCOUNTER — Encounter (HOSPITAL_COMMUNITY): Payer: Self-pay | Admitting: General Surgery

## 2015-10-06 DIAGNOSIS — H40012 Open angle with borderline findings, low risk, left eye: Secondary | ICD-10-CM | POA: Diagnosis not present

## 2015-10-06 DIAGNOSIS — H40011 Open angle with borderline findings, low risk, right eye: Secondary | ICD-10-CM | POA: Diagnosis not present

## 2015-10-11 DIAGNOSIS — H353221 Exudative age-related macular degeneration, left eye, with active choroidal neovascularization: Secondary | ICD-10-CM | POA: Diagnosis not present

## 2015-10-20 ENCOUNTER — Other Ambulatory Visit: Payer: Self-pay | Admitting: Cardiology

## 2015-10-20 ENCOUNTER — Other Ambulatory Visit: Payer: Self-pay

## 2015-10-20 MED ORDER — DILTIAZEM HCL 30 MG PO TABS: 30.0000 mg | ORAL_TABLET | Freq: Four times a day (QID) | ORAL | 11 refills | Status: DC | PRN

## 2015-10-31 ENCOUNTER — Encounter: Payer: Self-pay | Admitting: Urgent Care

## 2015-10-31 ENCOUNTER — Ambulatory Visit (INDEPENDENT_AMBULATORY_CARE_PROVIDER_SITE_OTHER): Payer: Medicare Other | Admitting: Urgent Care

## 2015-10-31 VITALS — BP 132/80 | HR 76 | Temp 97.8°F | Resp 16 | Ht 73.0 in | Wt 187.6 lb

## 2015-10-31 DIAGNOSIS — Z23 Encounter for immunization: Secondary | ICD-10-CM | POA: Diagnosis not present

## 2015-10-31 DIAGNOSIS — R21 Rash and other nonspecific skin eruption: Secondary | ICD-10-CM | POA: Diagnosis not present

## 2015-10-31 LAB — POCT SKIN KOH: Skin KOH, POC: NEGATIVE

## 2015-10-31 MED ORDER — FLUCONAZOLE 150 MG PO TABS: 150.0000 mg | ORAL_TABLET | Freq: Once | ORAL | 0 refills | Status: AC

## 2015-10-31 MED ORDER — KETOCONAZOLE 2 % EX CREA: 1.0000 "application " | TOPICAL_CREAM | Freq: Every day | CUTANEOUS | 0 refills | Status: AC

## 2015-10-31 NOTE — Progress Notes (Addendum)
    MRN: [id number] DOB: July 19, 1931  Subjective:   Chad [name] is a 82 y.o. male presenting for chief complaint of Rash (grion area x 2 weeks, itching, tried OTC creams with no relief )  Reports 2 week history of pruritic red rash over genital area. He has tried otc jock itch cream without any relief. Denies fever, n/v, abdominal pain, blisters, penile discharge, hematuria. Had surgical removal of an anal wart in 09/2015, follow up was good at the end of September. Denies history of DM, HIV.  Jazhiel has a current medication list which includes the following prescription(s): diltiazem, fluticasone, gabapentin, lisinopril, multivitamin-lutein, and omeprazole. Also is allergic to dyazide [hydrochlorothiazide w-triamterene].  [place]  has a past medical history of Abnormal EKG; Arthritis; Atrial flutter ([place]); Atrial tachycardia ([place]); Carotid artery calcification; Colon polyps (06/26/2005); COPD (chronic obstructive pulmonary disease) ([place]); Diverticulosis of colon (without mention of hemorrhage); Dysrhythmia; Gastritis, chronic; GERD (gastroesophageal reflux disease); Hypertension; Hyponatremia; IBS (irritable bowel syndrome); Internal and external hemorrhoids without complication; Macular degeneration of left eye; Oral cancer ([place]) (02/1997); Pneumonia; and Shortness of breath dyspnea. Also  has a past surgical history that includes Oropharyngeal resection (02/1997); Trigger finger release (Right, 1970's); Hemorrhoid surgery (1968); Colonoscopy (08/29/10); Esophagogastroduodenoscopy (09/20/10); Atrial flutter ablation (07-08-2012); Cardiac catheterization (04/2012); left heart catheterization with coronary angiogram (N/A, 05/06/2012); Atrial flutter ablation (N/A, 07/08/2012); Eye surgery (01/08/2014); and Wart fulguration (Left, 09/15/2015).  Objective:   Vitals: BP 132/80 (BP Location: Left Arm, Patient Position: Sitting, Cuff Size: Normal)   Pulse 76   Temp 97.8 F (36.6 C) (Oral)   Resp 16   Ht 6\' 1"   (1.854 m)   Wt 187 lb 9.6 oz (85.1 kg)   SpO2 98%   BMI 24.75 kg/m   Physical Exam  Constitutional: He is oriented to person, place, and time. He appears well-developed and well-nourished.  Cardiovascular: Normal rate.   Pulmonary/Chest: Effort normal.  Genitourinary:     Neurological: He is alert and oriented to person, place, and time.   Results for orders placed or performed in visit on 10/31/15 (from the past 24 hour(s))  POCT Skin KOH     Status: None   Collection Time: 10/31/15  2:35 PM  Result Value Ref Range   Skin KOH, POC Negative    Assessment and Plan :   1. Rash of genital area - Use diflucan once, ketoconazole to cover for fungal infection. Rtc in 2 weeks if problem persists.  2. Need for prophylactic vaccination and inoculation against influenza - Flu Vaccine QUAD 36+ mos IM   Jaynee Eagles, PA-C [facility] [phone] 10/31/2015 2:13 PM

## 2015-10-31 NOTE — Patient Instructions (Addendum)
Ketoconazole skin cream What is this medicine? KETOCONAZOLE (kee toe KON na zole) is an antifungal medicine. This cream is used to treat certain kinds of fungal or yeast infections of the skin. This medicine may be used for other purposes; ask your health care provider or pharmacist if you have questions. What should I tell my health care provider before I take this medicine? They need to know if you have any of these conditions: -an unusual or allergic reaction to ketoconazole, itraconazole, miconazole, sulfites, other foods, dyes or preservatives -pregnant or trying to get pregnant -breast-feeding How should I use this medicine? This medicine is for external use only. Follow the directions on the prescription label. Wash your hands before and after use. If treating a hand or nail infection, wash hands before use only. Apply a thin layer of cream to cover the affected skin and surrounding area. You can cover the area with a sterile gauze dressing or bandage. Do not use an airtight bandage, such as a plastic covered bandage. Do not get the cream in your eyes. If you do, rinse out with plenty of cool tap water. Finish the full course prescribed by your doctor or health care professional even if you think your condition is better. Do not stop using except on the advice of your doctor or health care professional. Talk to your pediatrician regarding the use of this medicine in children. Special care may be needed. Overdosage: If you think you have taken too much of this medicine contact a poison control center or emergency room at once. NOTE: This medicine is only for you. Do not share this medicine with others. What if I miss a dose? If you miss a dose, use it as soon as you can. If it is almost time for your next dose, use only that dose. Do not use double or extra doses. What may interact with this medicine? Interactions are not expected. Do not use any other skin products without telling your doctor  or health care professional. This list may not describe all possible interactions. Give your health care provider a list of all the medicines, herbs, non-prescription drugs, or dietary supplements you use. Also tell them if you smoke, drink alcohol, or use illegal drugs. Some items may interact with your medicine. What should I watch for while using this medicine? Tell your doctor or health care professional if your symptoms do not begin to improve in 1 to 2 weeks. If you are using this medicine for jock itch be sure to dry the groin completely after bathing. Do not wear underwear that is tight fitting or made from synthetic fibers like rayon or nylon. Wear loose fitting, cotton underwear. What side effects may I notice from receiving this medicine? Side effects that you should report to your doctor or health care professional as soon as possible: -allergic reactions like skin rash, itching or hives, swelling of the face, lips, or tongue -pain, tingling, numbness Side effects that usually do not require medical attention (report to your doctor or health care professional if they continue or are bothersome): -dry skin -skin irritation This list may not describe all possible side effects. Call your doctor for medical advice about side effects. You may report side effects to FDA at 1-800-FDA-1088. Where should I keep my medicine? Keep out of the reach of children. Store at room temperature between 15 and 30 degrees C (59 and 86 degrees F). Keep container tightly closed. Throw away any unused medicine after the expiration  date. NOTE: This sheet is a summary. It may not cover all possible information. If you have questions about this medicine, talk to your doctor, pharmacist, or health care provider.    2016, Elsevier/Gold Standard. (2007-06-27 14:29:45)     IF you received an x-ray today, you will receive an invoice from [facility]. Please contact [facility] at [phone]  with questions or concerns regarding your invoice.   IF you received labwork today, you will receive an invoice from [employer]. Please contact [facility] at [phone] with questions or concerns regarding your invoice.   Our billing staff will not be able to assist you with questions regarding bills from these companies.  You will be contacted with the lab results as soon as they are available. The fastest way to get your results is to activate your My Chart account. Instructions are located on the last page of this paperwork. If you have not heard from [place] regarding the results in 2 weeks, please contact this office.

## 2015-10-31 NOTE — Addendum Note (Signed)
Addended by: Alfredia Ferguson A on: 10/31/2015 02:41 PM   Modules accepted: Orders

## 2015-11-09 ENCOUNTER — Ambulatory Visit: Payer: Medicare Other

## 2015-11-16 ENCOUNTER — Ambulatory Visit (INDEPENDENT_AMBULATORY_CARE_PROVIDER_SITE_OTHER): Payer: Medicare Other | Admitting: Urgent Care

## 2015-11-16 VITALS — BP 122/72 | HR 74 | Temp 97.6°F | Resp 16 | Ht 73.0 in | Wt 183.0 lb

## 2015-11-16 DIAGNOSIS — R21 Rash and other nonspecific skin eruption: Secondary | ICD-10-CM | POA: Diagnosis not present

## 2015-11-16 NOTE — Patient Instructions (Addendum)
Keep using ketoconazole twice a day. And you should use baby powder over the area at night to keep the area dry.    IF you received an x-ray today, you will receive an invoice from [facility]. Please contact [facility] at [phone] with questions or concerns regarding your invoice.   IF you received labwork today, you will receive an invoice from [employer]. Please contact [facility] at [phone] with questions or concerns regarding your invoice.   Our billing staff will not be able to assist you with questions regarding bills from these companies.  You will be contacted with the lab results as soon as they are available. The fastest way to get your results is to activate your My Chart account. Instructions are located on the last page of this paperwork. If you have not heard from [place] regarding the results in 2 weeks, please contact this office.

## 2015-11-16 NOTE — Progress Notes (Signed)
    MRN: [id number] DOB: [birth year]  Subjective:   Chad [name] is a 82 y.o. male presenting for follow up on genital rash.   Patient was last seen on 10/31/2015 for same, started on diflucan and ketoconazole for fungal infection. Today, reports improvement but still has some itching. Denies fever, bleeding, penile discharge, dysuria, hematuria.   Mohmad has a current medication list which includes the following prescription(s): cholecalciferol, cyanocobalamin, diltiazem, fluticasone, gabapentin, lisinopril, multivitamin-lutein, and omeprazole. Also is allergic to dyazide [hydrochlorothiazide w-triamterene].  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 Also  has a past surgical history that includes Oropharyngeal resection (02/1997); Trigger finger release (Right, 1970's); Hemorrhoid surgery (1968); Colonoscopy (08/29/10); Esophagogastroduodenoscopy (09/20/10); Atrial flutter ablation (07-08-2012); Cardiac catheterization (04/2012); left heart catheterization with coronary angiogram (N/A, 05/06/2012); Atrial flutter ablation (N/A, 07/08/2012); Eye surgery (01/08/2014); and Wart fulguration (Left, 09/15/2015).   Objective:   Vitals: BP 122/72 (BP Location: Right Arm, Patient Position: Sitting, Cuff Size: Small)   Pulse 74   Temp 97.6 F (36.4 C) (Oral)   Resp 16   Ht 6\' 1"  (1.854 m)   Wt 183 lb (83 kg)   SpO2 96%   BMI 24.14 kg/m   Physical Exam  Constitutional: He is oriented to  person, place, and time. He appears well-developed and well-nourished.  Cardiovascular: Normal rate.   Pulmonary/Chest: Effort normal.  Genitourinary:     Genitourinary Comments: Exam under woods lamp reveals golden hue over rash.  Neurological: He is alert and oriented to person, place, and time.   Assessment and Plan :   This case was precepted and patient examined with Dr. Everlene Farrier.   1. Rash of genital area - Improving, continue ketoconazole. F/u with Dr. Tamala Julian at next scheduled visit at the end of November.  Jaynee Eagles, PA-C [facility] [phone] 11/16/2015 1:34 PM

## 2015-11-22 ENCOUNTER — Emergency Department (HOSPITAL_COMMUNITY)
Admission: EM | Admit: 2015-11-22 | Discharge: 2015-11-22 | Disposition: A | Discharge status: Home / Self Care | Payer: Medicare Other | Attending: Emergency Medicine | Admitting: Emergency Medicine

## 2015-11-22 ENCOUNTER — Encounter (HOSPITAL_COMMUNITY): Payer: Self-pay | Admitting: Emergency Medicine

## 2015-11-22 ENCOUNTER — Emergency Department (HOSPITAL_COMMUNITY): Payer: Medicare Other

## 2015-11-22 DIAGNOSIS — Z87891 Personal history of nicotine dependence: Secondary | ICD-10-CM | POA: Diagnosis not present

## 2015-11-22 DIAGNOSIS — Z79899 Other long term (current) drug therapy: Secondary | ICD-10-CM | POA: Diagnosis not present

## 2015-11-22 DIAGNOSIS — K529 Noninfective gastroenteritis and colitis, unspecified: Secondary | ICD-10-CM | POA: Diagnosis not present

## 2015-11-22 DIAGNOSIS — R1033 Periumbilical pain: Secondary | ICD-10-CM | POA: Insufficient documentation

## 2015-11-22 DIAGNOSIS — I10 Essential (primary) hypertension: Secondary | ICD-10-CM | POA: Diagnosis not present

## 2015-11-22 DIAGNOSIS — R197 Diarrhea, unspecified: Secondary | ICD-10-CM

## 2015-11-22 DIAGNOSIS — J449 Chronic obstructive pulmonary disease, unspecified: Secondary | ICD-10-CM | POA: Diagnosis not present

## 2015-11-22 DIAGNOSIS — R404 Transient alteration of awareness: Secondary | ICD-10-CM | POA: Diagnosis not present

## 2015-11-22 DIAGNOSIS — R42 Dizziness and giddiness: Secondary | ICD-10-CM | POA: Diagnosis not present

## 2015-11-22 LAB — URINE MICROSCOPIC-ADD ON: RBC / HPF: NONE SEEN RBC/hpf (ref 0–5)

## 2015-11-22 LAB — CBC WITH DIFFERENTIAL/PLATELET
BASOS PCT: 0 %
Basophils Absolute: 0 10*3/uL (ref 0.0–0.1)
EOS ABS: 0.3 10*3/uL (ref 0.0–0.7)
Eosinophils Relative: 2 %
HCT: 37.2 % — ABNORMAL LOW (ref 39.0–52.0)
HEMOGLOBIN: 12.7 g/dL — AB (ref 13.0–17.0)
LYMPHS ABS: 1 10*3/uL (ref 0.7–4.0)
Lymphocytes Relative: 6 %
MCH: 28.6 pg (ref 26.0–34.0)
MCHC: 34.1 g/dL (ref 30.0–36.0)
MCV: 83.8 fL (ref 78.0–100.0)
MONO ABS: 1.4 10*3/uL — AB (ref 0.1–1.0)
MONOS PCT: 8 %
Neutro Abs: 14.1 10*3/uL — ABNORMAL HIGH (ref 1.7–7.7)
Neutrophils Relative %: 84 %
Platelets: 228 10*3/uL (ref 150–400)
RBC: 4.44 MIL/uL (ref 4.22–5.81)
RDW: 13.8 % (ref 11.5–15.5)
WBC: 16.7 10*3/uL — ABNORMAL HIGH (ref 4.0–10.5)

## 2015-11-22 LAB — GASTROINTESTINAL PANEL BY PCR, STOOL (REPLACES STOOL CULTURE)

## 2015-11-22 LAB — COMPREHENSIVE METABOLIC PANEL
ALBUMIN: 3.4 g/dL — AB (ref 3.5–5.0)
ALK PHOS: 54 U/L (ref 38–126)
ALT: 14 U/L — ABNORMAL LOW (ref 17–63)
ANION GAP: 8 (ref 5–15)
AST: 24 U/L (ref 15–41)
BILIRUBIN TOTAL: 0.7 mg/dL (ref 0.3–1.2)
BUN: 13 mg/dL (ref 6–20)
CALCIUM: 9 mg/dL (ref 8.9–10.3)
CO2: 24 mmol/L (ref 22–32)
Chloride: 106 mmol/L (ref 101–111)
Creatinine, Ser: 1.4 mg/dL — ABNORMAL HIGH (ref 0.61–1.24)
GFR calc non Af Amer: 45 mL/min — ABNORMAL LOW (ref >60)
GFR, EST AFRICAN AMERICAN: 52 mL/min — AB (ref >60)
GLUCOSE: 117 mg/dL — AB (ref 65–99)
POTASSIUM: 4.4 mmol/L (ref 3.5–5.1)
SODIUM: 138 mmol/L (ref 135–145)
TOTAL PROTEIN: 6.3 g/dL — AB (ref 6.5–8.1)

## 2015-11-22 LAB — URINALYSIS, ROUTINE W REFLEX MICROSCOPIC
Bilirubin Urine: NEGATIVE
Glucose, UA: NEGATIVE mg/dL
Hgb urine dipstick: NEGATIVE
Ketones, ur: NEGATIVE mg/dL
NITRITE: NEGATIVE
PH: 6.5 (ref 5.0–8.0)
Protein, ur: NEGATIVE mg/dL
SPECIFIC GRAVITY, URINE: 1.024 (ref 1.005–1.030)

## 2015-11-22 LAB — C DIFFICILE QUICK SCREEN W PCR REFLEX
C DIFFICILE (CDIFF) INTERP: NOT DETECTED
C DIFFICILE (CDIFF) TOXIN: NEGATIVE
C DIFFICLE (CDIFF) ANTIGEN: NEGATIVE

## 2015-11-22 LAB — I-STAT CG4 LACTIC ACID, ED: LACTIC ACID, VENOUS: 0.82 mmol/L (ref 0.5–1.9)

## 2015-11-22 MED ORDER — ONDANSETRON HCL 4 MG/2ML IJ SOLN
4.0000 mg | Freq: Once | INTRAMUSCULAR | Status: AC
  Administered 2015-11-22: 4 mg via INTRAVENOUS
  Filled 2015-11-22: qty 2

## 2015-11-22 MED ORDER — LOPERAMIDE HCL 2 MG PO CAPS: 2.0000 mg | ORAL_CAPSULE | Freq: Four times a day (QID) | ORAL | 0 refills | Status: DC | PRN

## 2015-11-22 MED ORDER — ONDANSETRON HCL 4 MG PO TABS: 4.0000 mg | ORAL_TABLET | Freq: Four times a day (QID) | ORAL | 0 refills | Status: DC | PRN

## 2015-11-22 MED ORDER — IOPAMIDOL (ISOVUE-300) INJECTION 61%
INTRAVENOUS | Status: AC
  Administered 2015-11-22: 75 mL
  Filled 2015-11-22: qty 75

## 2015-11-22 MED ORDER — MORPHINE SULFATE (PF) 4 MG/ML IV SOLN
4.0000 mg | Freq: Once | INTRAVENOUS | Status: AC
  Administered 2015-11-22: 4 mg via INTRAVENOUS
  Filled 2015-11-22: qty 1

## 2015-11-22 MED ORDER — SODIUM CHLORIDE 0.9 % IV BOLUS (SEPSIS)
1000.0000 mL | Freq: Once | INTRAVENOUS | Status: AC
  Administered 2015-11-22: 1000 mL via INTRAVENOUS

## 2015-11-22 MED ORDER — LOPERAMIDE HCL 2 MG PO CAPS
2.0000 mg | ORAL_CAPSULE | ORAL | Status: DC | PRN
  Administered 2015-11-22: 2 mg via ORAL
  Filled 2015-11-22: qty 1

## 2015-11-22 NOTE — ED Provider Notes (Signed)
[facility] DEPT Provider Note   CSN: [id number] Arrival date & time: 11/22/15  0204  History   Chief Complaint Chief Complaint  Patient presents with  . Diarrhea  . Emesis    HPI Chad [name] is a 82 y.o. male.  HPI   Patient to the ER with complaints of abnormal EKG, arthritis, atrial flutter, atrial tachycardia, CAD, colon polyps, COPD, dysrhythmia, gastritis, GERD, hypertension, hyponatremia, IBS, oral cancer, PNA,   Patient comes to the ER because he woke up out of his sleep and has had 8+ episodes of diarrhea, he claims he didn't look at it so is unable to describe it. He has also had 1 episode of vomiting, NBNB. He is having some periumbilical abdominal pain associated with his hernia, denies having had abdominal surgery in the past. His pain is moderate, he is afebrile, he appears uncomfortable but does not appear to be in any distress.  Past Medical History:  Diagnosis Date  . Abnormal EKG    ST changes with normal coronaries by cath 05/06/12.  . Arthritis    "hands" (01/12/2013); feet (podiatry consultation)  . Atrial flutter ([place])    a. Slow ~100bpm when in 2:1; dx 04/2012. b. Placed on Xarelto.  c. s/p ablation 07-08-2012 by Dr Rayann Heman  . Atrial tachycardia ([place])   . Carotid artery calcification    By CXR - dopplers 05/06/12 without obvious evidence of significant ICA stenosis >40%  . Colon polyps 06/26/2005  . COPD (chronic obstructive pulmonary disease) ([place])   . Diverticulosis of colon (without mention of hemorrhage)   . Dysrhythmia   . Gastritis, chronic    Pt denies bleeding 04/2012. EGD 2012 reportedly normal.  . GERD (gastroesophageal reflux disease)   . Hypertension   . Hyponatremia    04/2012 r/t diuretic.  . IBS (irritable bowel syndrome)   . Internal and external hemorrhoids without complication   . Macular degeneration of left eye   . Oral cancer ([place]) 02/1997  . Pneumonia    "couple times" (01/12/2013)  . Shortness of breath dyspnea    walking  distances    Patient Active Problem List   Diagnosis Date Noted  . Anal lesion 06/07/2015  . Gas 01/18/2015  . Aortic stenosis 06/08/2013  . Unsteadiness 02/11/2013  . Ectopic atrial tachycardia ([place]) 01/12/2013  . Atrial tachycardia ([place]) 01/12/2013  . Atrial flutter ([place]) 05/06/2012  . Aortic valve sclerosis 05/06/2012  . Acute renal failure ([place]) 05/05/2012  . 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 Anemia 07/27/2010  . Essential hypertension 11/15/2008  . COPD 11/15/2008  . GERD 11/15/2008  . IBS 11/15/2008    Past Surgical History:  Procedure Laterality Date  . ATRIAL FLUTTER ABLATION  07-08-2012   of typical atrial flutter by Dr Rayann Heman  . ATRIAL FLUTTER ABLATION N/A 07/08/2012   Procedure: ATRIAL FLUTTER ABLATION;  Surgeon: Thompson Grayer, MD;  Location: [facility] CATH LAB;  Service: Cardiovascular;  Laterality: N/A;  . CARDIAC CATHETERIZATION  04/2012  . COLONOSCOPY  08/29/10   diverticulosis, internal hemorrhoids  . ESOPHAGOGASTRODUODENOSCOPY  09/20/10   normal  . EYE SURGERY  01/08/2014   Cataract resection L.    [name] [place]  . LEFT HEART CATHETERIZATION WITH CORONARY ANGIOGRAM N/A 05/06/2012   Procedure: LEFT HEART CATHETERIZATION WITH CORONARY ANGIOGRAM;  Surgeon: Peter M Martinique, MD;  Location: [facility] CATH LAB;  Service: Cardiovascular;  Laterality: N/A;  . 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 TRIGGER FINGER RELEASE Right 1970's   "2 fingers"  . WART FULGURATION Left 09/15/2015   Procedure: EXCISIONal biospy of left peri anual and anual canal mass;  Surgeon: Greer Pickerel, MD;  Location: [facility] ORS;  Service: General;  Laterality: Left;     Home Medications    Prior to Admission medications   Medication Sig Start Date End Date Taking? Authorizing Provider  cholecalciferol (VITAMIN D) 1000 units tablet Take 3,000 Units by mouth daily.    Historical Provider, MD  cyanocobalamin 1000 MCG tablet Take  1,000 mcg by mouth daily.    Historical Provider, MD  diltiazem (CARDIZEM) 30 MG tablet Take 1 tablet (30 mg total) by mouth every 6 (six) hours as needed. For palpitations. 10/20/15   Lelon Perla, MD  fluticasone (FLONASE) 50 MCG/ACT nasal spray Place 2 sprays into both nostrils daily. 05/31/15   Wardell Honour, MD  gabapentin (NEURONTIN) 300 MG capsule Take 3 capsules at bedtime. 05/31/15   Wardell Honour, MD  lisinopril (PRINIVIL,ZESTRIL) 10 MG tablet Take 1 tablet (10 mg total) by mouth daily. 05/31/15   Wardell Honour, MD  multivitamin-lutein (OCUVITE-LUTEIN) CAPS capsule Take 1 capsule by mouth daily.    Historical Provider, MD  omeprazole (PRILOSEC) 20 MG capsule Take 1 capsule (20 mg total) by mouth daily. Take one hour before breakfast 05/31/15   Wardell Honour, MD    Family History Family History  Problem Relation Age of Onset  . Heart disease Brother   . Throat cancer Brother   . Other Mother     UNSURE  . Other Father     UNSURE  . Emphysema Sister   . Emphysema Sister   . Other Brother     UNSURE  . Diabetes      Social History Social History  Substance Use Topics  . Smoking status: Former Smoker    Packs/day: 0.50    Years: 20.00    Types: Cigarettes, Pipe, Cigars    Quit date: 01/08/1966  . Smokeless tobacco: Never Used     Comment:    . Alcohol use 0.0 oz/week     Comment: 01/12/2013 "did drink 2-3 12 oz beers a day; stopped drinking in 04/2012"     Allergies   Dyazide [hydrochlorothiazide w-triamterene]   Review of Systems Review of Systems  Review of Systems All other systems negative except as documented in the HPI. All pertinent positives and negatives as reviewed in the HPI.  Physical Exam Updated Vital Signs BP 149/66 (BP Location: Right Arm)   Pulse 63   Temp 98.1 F (36.7 C) (Oral)   Resp 16   Ht 6\' 1"  (1.854 m)   Wt 83 kg   SpO2 99%   BMI 24.14 kg/m   Physical Exam  Constitutional: He appears well-developed and well-nourished. No  distress.  HENT:  Head: Normocephalic and atraumatic.  Nose: Nose normal.  Mouth/Throat: Uvula is midline, oropharynx is clear and moist and mucous membranes are normal.  Eyes: Pupils are equal, round, and reactive to light.  Neck: Normal range of motion. Neck supple.  Cardiovascular: Normal rate and regular rhythm.   Pulmonary/Chest: Effort normal.  Abdominal: Soft.  No signs of abdominal distention Hernia is reducible but does cause patient some discomfort. Benign abdomen with generalized mild tenderness.  Musculoskeletal:  No LE swelling  Neurological: He is alert.  Acting at baseline  Skin: Skin is warm and dry. No rash noted.  Nursing note and  vitals reviewed.   ED Treatments / Results  Labs (all labs ordered are listed, but only abnormal results are displayed) Labs Reviewed  CBC WITH DIFFERENTIAL/PLATELET - Abnormal; Notable for the following:       Result Value   WBC 16.7 (*)    Hemoglobin 12.7 (*)    HCT 37.2 (*)    Neutro Abs 14.1 (*)    Monocytes Absolute 1.4 (*)    All other components within normal limits  COMPREHENSIVE METABOLIC PANEL - Abnormal; Notable for the following:    Glucose, Bld 117 (*)    Creatinine, Ser 1.40 (*)    Total Protein 6.3 (*)    Albumin 3.4 (*)    ALT 14 (*)    GFR calc non Af Amer 45 (*)    GFR calc Af Amer 52 (*)    All other components within normal limits  C DIFFICILE QUICK SCREEN W PCR REFLEX  GASTROINTESTINAL PANEL BY PCR, STOOL (REPLACES STOOL CULTURE)  URINALYSIS, ROUTINE W REFLEX MICROSCOPIC (NOT AT [facility])  I-STAT CG4 LACTIC ACID, ED    EKG  EKG Interpretation None       Radiology Ct Abdomen Pelvis W Contrast  Result Date: 11/22/2015 CLINICAL DATA:  Initial evaluation for acute paraumbilical hernia pain. EXAM: CT ABDOMEN AND PELVIS WITH CONTRAST TECHNIQUE: Multidetector CT imaging of the abdomen and pelvis was performed using the standard protocol following bolus administration of intravenous contrast. CONTRAST:   28mL ISOVUE-300 IOPAMIDOL (ISOVUE-300) INJECTION 61% COMPARISON:  None available. FINDINGS: Lower chest: Bibasilar atelectasis/ scarring noted within the visualized lung bases. Visualized lungs are otherwise clear. Prominent valvular calcifications noted. Hepatobiliary: The liver demonstrates a normal contrast enhanced appearance. Gallbladder within normal limits. No biliary dilatation. Pancreas: Pancreas within normal limits. Spleen: Spleen within normal limits. Adrenals/Urinary Tract: Adrenal glands are normal. Kidneys equal in size with symmetric enhancement. Subcentimeter hypodensities within the kidneys noted, too small the characterize, but statistically likely reflects small cysts. No nephrolithiasis, hydronephrosis, or focal enhancing renal mass. Ureters of normal caliber without acute abnormality. Bladder partially distended without acute abnormality. Stomach/Bowel: Stomach within normal limits. No evidence for bowel obstruction. There is hazy inflammatory stranding about 80 short loop of small bowel within the left anterior abdomen, suggestive of acute enteritis (series 201, image 53). Small bowel otherwise within normal limits without acute inflammatory changes. Appendix within normal limits. Colon of normal caliber without acute abnormality. Vascular/Lymphatic: Moderate atheromatous plaque seen throughout the intra-abdominal aorta. No aneurysm. No adenopathy. Reproductive: Prostate within normal limits. Other: No free intraperitoneal air. Trace free fluid within the lower abdomen, likely reactive (series 201, image 73). Small bilateral fat containing inguinal hernias noted. Additional fact containing paraumbilical hernia noted. No associated inflammation. Musculoskeletal: Chronic bilateral pars defects at L5-S1 with associated grade 1 spondylolisthesis. No acute osseous abnormality. No worrisome lytic or blastic osseous lesions. IMPRESSION: 1. Hazy inflammatory stranding about a short loop of small bowel  within the anterior left abdomen, suggestive of acute enteritis. This may be either infectious or inflammatory nature. No associated obstruction or other complication. 2. Trace free fluid within the lower abdomen, likely reactive in nature. 3. No other acute intra-abdominal or pelvic process identified. 4. Chronic grade 1 spondylolisthesis of L5 on S1. 5. Moderate aorto bi-iliac atherosclerotic disease.  No aneurysm. Electronically Signed   By: Jeannine Boga M.D.   On: 11/22/2015 06:03    Procedures Procedures (including critical care time)  Medications Ordered in ED Medications  loperamide (IMODIUM) capsule 2 mg (not administered)  sodium chloride 0.9 % bolus 1,000 mL (0 mLs Intravenous Stopped 11/22/15 0508)  morphine 4 MG/ML injection 4 mg (4 mg Intravenous Given 11/22/15 0407)  ondansetron (ZOFRAN) injection 4 mg (4 mg Intravenous Given 11/22/15 0407)  iopamidol (ISOVUE-300) 61 % injection (75 mLs  Contrast Given 11/22/15 0450)     Initial Impression / Assessment and Plan / ED Course  I have reviewed the triage vital signs and the nursing notes.  Pertinent labs & imaging results that were available during my care of the patient were reviewed by me and considered in my medical decision making (see chart for details).  Clinical Course     Patient seen by Dr. Leonides Schanz as well, hernia easily reducible and abdomen is benign. Stool passed in ED and it is light brown and loose. CT abdomen/pelv, lactic acid, urinalysis, C. Diff and stool panel ordered.   At end of shift patient sign out to [employer], PA-C. -- CT scan shows enteritis.  If patient is able to drink fluids, diarrhea has slowed down, patient feels comfortable pt can likely can go home.  Final Clinical Impressions(s) / ED Diagnoses   Final diagnoses:  Diarrhea, unspecified type  Enteritis    New Prescriptions New Prescriptions   No medications on file     Delos Haring, PA-C 11/22/15 [place],  DO 11/22/15 [phone]

## 2015-11-22 NOTE — ED Provider Notes (Signed)
PROGRESS NOTE                                                                                                                 This is a sign-out from [place] at shift change: Chad [name] is a 82 y.o. male presenting with nausea vomiting diarrhea. CT scan negative, abdominal exam is benign. Patient afebrile however, he has a white count of 16.7. CAT scan with a enteritis, Plan is to follow-up urinalysis and by mouth challenge Please refer to previous note for full HPI, ROS, PMH and PE.   Reevaluated, he is resting comfortably. Abdominal exam with no tenderness to palpation, guarding or rebound. States that he is tolerating by mouth's, the diarrhea has slowed and he feels comfortable with going home. We've had an extensive discussion of return precautions and patient realizes his understanding and teach back technique.  Vitals:   11/22/15 0330 11/22/15 0430 11/22/15 0600 11/22/15 0630  BP: 165/71 (!) 125/109 139/74 124/75  Pulse: (!) 57 120 85 101  Resp: 21 25 19 14   Temp:      TempSrc:      SpO2: 99% 99% 100% 95%  Weight:      Height:        Medications  loperamide (IMODIUM) capsule 2 mg (2 mg Oral Given 11/22/15 0659)  sodium chloride 0.9 % bolus 1,000 mL (0 mLs Intravenous Stopped 11/22/15 0508)  morphine 4 MG/ML injection 4 mg (4 mg Intravenous Given 11/22/15 0407)  ondansetron (ZOFRAN) injection 4 mg (4 mg Intravenous Given 11/22/15 0407)  iopamidol (ISOVUE-300) 61 % injection (75 mLs  Contrast Given 11/22/15 0450)   Evaluation does not show pathology that would require ongoing emergent intervention or inpatient treatment. Pt is hemodynamically stable and mentating appropriately. Discussed findings and plan with patient/guardian, who agrees with care plan. All questions answered. Return precautions discussed and outpatient follow up given.       Monico Blitz, PA-C 11/22/15 [phone]

## 2015-11-22 NOTE — ED Triage Notes (Signed)
Pt brought in from home via EMS with c/o N/V/D. Per EMS he woke up and had an episode of diarrhea and one episode of vomiting. Pt denies any abd or chest pain.

## 2015-11-22 NOTE — Discharge Instructions (Signed)
Please follow with your primary care doctor in the next 2 days for a check-up. They must obtain records for further management.  ° °Do not hesitate to return to the Emergency Department for any new, worsening or concerning symptoms.  ° °

## 2015-11-29 DIAGNOSIS — H353221 Exudative age-related macular degeneration, left eye, with active choroidal neovascularization: Secondary | ICD-10-CM | POA: Diagnosis not present

## 2015-12-06 ENCOUNTER — Ambulatory Visit (INDEPENDENT_AMBULATORY_CARE_PROVIDER_SITE_OTHER): Payer: Medicare Other | Admitting: Family Medicine

## 2015-12-06 ENCOUNTER — Encounter: Payer: Self-pay | Admitting: Family Medicine

## 2015-12-06 VITALS — BP 144/88 | HR 73 | Temp 97.6°F | Resp 16 | Ht 72.0 in | Wt 177.0 lb

## 2015-12-06 DIAGNOSIS — K629 Disease of anus and rectum, unspecified: Secondary | ICD-10-CM

## 2015-12-06 DIAGNOSIS — I471 Supraventricular tachycardia: Secondary | ICD-10-CM

## 2015-12-06 DIAGNOSIS — K6282 Dysplasia of anus: Secondary | ICD-10-CM | POA: Diagnosis not present

## 2015-12-06 DIAGNOSIS — Z23 Encounter for immunization: Secondary | ICD-10-CM | POA: Diagnosis not present

## 2015-12-06 DIAGNOSIS — I483 Typical atrial flutter: Secondary | ICD-10-CM | POA: Diagnosis not present

## 2015-12-06 DIAGNOSIS — I10 Essential (primary) hypertension: Secondary | ICD-10-CM | POA: Diagnosis not present

## 2015-12-06 DIAGNOSIS — I350 Nonrheumatic aortic (valve) stenosis: Secondary | ICD-10-CM

## 2015-12-06 DIAGNOSIS — K219 Gastro-esophageal reflux disease without esophagitis: Secondary | ICD-10-CM

## 2015-12-06 DIAGNOSIS — I498 Other specified cardiac arrhythmias: Secondary | ICD-10-CM | POA: Diagnosis not present

## 2015-12-06 DIAGNOSIS — K529 Noninfective gastroenteritis and colitis, unspecified: Secondary | ICD-10-CM

## 2015-12-06 NOTE — Progress Notes (Signed)
Subjective:    Patient ID: Chad [name], male    DOB: [birth year], 82 y.o.   MRN: [id number]  12/06/2015  Follow-up (wellness visit)   HPI This 82 y.o. male presents for TRANSITION INTO CARE for ED visit for diarrhea.  Evaluated in ED on 11/22/2015 for diarrhea; s/p CT scan that revealed enteritis.  Suffered with diarrhea for one day.  Had eight stools in one day.  Non-bloody, non-mucous diarrhea.  Also vomited x 1.  No abdominal pain.  Thinks ate bad ice cream.  Got dizzy so called EMT.  Doing much better; no further diarrhea or vomiting.    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 Required surgical clearance from cardiology.   HTN: Patient reports good compliance with medication, good tolerance to medication, and good symptom control.   Taking Lisinopril and Diltiazem.   Peripheral neuropathy: taking Gabapaentin 900mg  qhs.  Pain in feet much improved; no nighttime awakening. Podiatrist diagnosed with OA in feet.  GERD/gas: no longer having any gas.  Appetite is good. Has bad teeth in mouth; will need teeth extractions.  Goes to dentist this week.      Immunization History  Administered Date(s) Administered  . Influenza Split 01/09/2012, 12/07/2013  . Influenza,inj,Quad PF,36+ Mos 10/29/2014, 10/31/2015  . Pneumococcal Conjugate-13 04/28/2014  . Pneumococcal Polysaccharide-23 12/06/2015  . Pneumococcal-Unspecified 01/09/2008  . Tdap 01/09/2008   BP Readings from Last 3 Encounters:  12/06/15 (!) 144/88  11/22/15 111/73  11/16/15 122/72   Wt Readings from Last 3 Encounters:  12/06/15 177 lb (80.3 kg)  11/22/15 183 lb (83 kg)  11/16/15 183 lb (83 kg)    Review of Systems  Constitutional: Negative for activity change, appetite change, chills, diaphoresis, fatigue and fever.  Eyes: Negative for visual disturbance.  Respiratory: Negative for cough and shortness of  breath.   Cardiovascular: Negative for chest pain, palpitations and leg swelling.  Endocrine: Negative for cold intolerance, heat intolerance, polydipsia, polyphagia and polyuria.  Neurological: Negative for dizziness, tremors, seizures, syncope, facial asymmetry, speech difficulty, weakness, light-headedness, numbness and headaches.    Past Medical History:  Diagnosis Date  . Abnormal EKG    ST changes with normal coronaries by cath 05/06/12.  . Arthritis    "hands" (01/12/2013); feet (podiatry consultation)  . Atrial flutter (HCC)    a. Slow ~100bpm when in 2:1; dx 04/2012. b. Placed on Xarelto.  c. s/p ablation 07-08-2012 by Dr Johney Frame  . Atrial tachycardia (HCC)   . Carotid artery calcification    By CXR - dopplers 05/06/12 without obvious evidence of significant ICA stenosis >40%  . Colon polyps 06/26/2005  . COPD (chronic obstructive pulmonary disease) (HCC)   . Diverticulosis of colon (without mention of hemorrhage)   . Dysrhythmia   . Gastritis, chronic    Pt denies bleeding 04/2012. EGD 2012 reportedly normal.  . GERD (gastroesophageal reflux disease)   . Hypertension   . Hyponatremia    04/2012 r/t diuretic.  . IBS (irritable bowel syndrome)   . Internal and external hemorrhoids without complication   . Macular degeneration of left eye   . 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 Pneumonia    "couple times" (01/12/2013)  . Shortness of breath dyspnea    walking distances   Past Surgical History:  Procedure Laterality Date  . ATRIAL FLUTTER ABLATION  07-08-2012   of typical atrial flutter by Dr Johney Frame  . ATRIAL FLUTTER ABLATION  N/A 07/08/2012   Procedure: ATRIAL FLUTTER ABLATION;  Surgeon: Hillis Range, MD;  Location: [facility] CATH LAB;  Service: Cardiovascular;  Laterality: N/A;  . CARDIAC CATHETERIZATION  04/2012  . COLONOSCOPY  08/29/10   diverticulosis, internal hemorrhoids  . ESOPHAGOGASTRODUODENOSCOPY  09/20/10   normal  . EYE SURGERY  01/08/2014   Cataract resection L.    [name] HEMORRHOID  SURGERY  1968  . LEFT HEART CATHETERIZATION WITH CORONARY ANGIOGRAM N/A 05/06/2012   Procedure: LEFT HEART CATHETERIZATION WITH CORONARY ANGIOGRAM;  Surgeon: Peter M Swaziland, MD;  Location: [facility] CATH LAB;  Service: Cardiovascular;  Laterality: N/A;  . 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 TRIGGER FINGER RELEASE Right 1970's   "2 fingers"  . WART FULGURATION Left 09/15/2015   Procedure: EXCISIONal biospy of left peri anual and anual canal mass;  Surgeon: Gaynelle Adu, MD;  Location: [facility] ORS;  Service: General;  Laterality: Left;   Allergies  Allergen Reactions  . Dyazide [Hydrochlorothiazide W-Triamterene] Other (See Comments)    Caused hyponatremia 04/2012   Current Outpatient Prescriptions  Medication Sig Dispense Refill  . cholecalciferol (VITAMIN D) 1000 units tablet Take 3,000 Units by mouth daily.    . cyanocobalamin 1000 MCG tablet Take 1,000 mcg by mouth daily.    [name] diltiazem (CARDIZEM) 30 MG tablet Take 1 tablet (30 mg total) by mouth every 6 (six) hours as needed. For palpitations. 30 tablet 11  . fluticasone (FLONASE) 50 MCG/ACT nasal spray Place 2 sprays into both nostrils daily. 48 g 3  . gabapentin (NEURONTIN) 300 MG capsule Take 3 capsules at bedtime. 270 capsule 3  . lisinopril (PRINIVIL,ZESTRIL) 10 MG tablet Take 1 tablet (10 mg total) by mouth daily. 90 tablet 3  . loperamide (IMODIUM) 2 MG capsule Take 1 capsule (2 mg total) by mouth 4 (four) times daily as needed for diarrhea or loose stools. 12 capsule 0  . multivitamin-lutein (OCUVITE-LUTEIN) CAPS capsule Take 1 capsule by mouth daily.    [name] omeprazole (PRILOSEC) 20 MG capsule Take 1 capsule (20 mg total) by mouth daily. Take one hour before breakfast 90 capsule 3  . ondansetron (ZOFRAN) 4 MG tablet Take 1 tablet (4 mg total) by mouth every 6 (six) hours as needed for nausea or vomiting. 4 tablet 0   No current facility-administered medications for this visit.    Social History   Social History  . Marital  status: Divorced    Spouse name: N/A  . Number of children: 2  . Years of education: N/A   Occupational History  . Retired    Social History Main Topics  . Smoking status: Former Smoker    Packs/day: 0.50    Years: 20.00    Types: Cigarettes, Pipe, Cigars    Quit date: 01/08/1966  . Smokeless tobacco: Never Used     Comment:    . Alcohol use 0.0 oz/week     Comment: 01/12/2013 "did drink 2-3 12 oz beers a day; stopped drinking in 04/2012"  . Drug use: No  . Sexual activity: No   Other Topics Concern  . Not on file   Social History Narrative   Marital status: divorced; lives with ex-wife; not dating in 2017.      Children:  2 sons, 2 grandsons, 1 granddaughter; no gg.      Lives: with ex-wife in house.      Employment: retired first time age 43; retired in 1997.  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  Tobacco: quit smoking 1997; smoked for 27 years.      Alcohol: quit 2004      Exercise:  Walking daily short distances.       ADLs:  NO assistant devices; has cane if needs it; drives; pays bill; grocery shopping by wife; wife cleans house and does laundry.     Advanced Directives:  +LIVING WILL; +FULL CODE.  HCPOA: oldest son Leiby Stantz Sr.)      Family History  Problem Relation Age of Onset  . Heart disease Brother   . Throat cancer Brother   . Other Mother     UNSURE  . Other Father     UNSURE  . Emphysema Sister   . Emphysema Sister   . Other Brother     UNSURE  . Diabetes         Objective:    BP (!) 144/88 (BP Location: Right Arm, Patient Position: Sitting, Cuff Size: Normal)   Pulse 73   Temp 97.6 F (36.4 C) (Oral)   Resp 16   Ht 6' (1.829 m)   Wt 177 lb (80.3 kg)   SpO2 96%   BMI 24.01 kg/m  Physical Exam  Constitutional: He is oriented to person, place, and time. He appears well-developed and well-nourished. No distress.  HENT:  Head: Normocephalic and atraumatic.  Right Ear: External ear normal.  Left Ear: External ear  normal.  Nose: Nose normal.  Mouth/Throat: Oropharynx is clear and moist.  Eyes: Conjunctivae and EOM are normal. Pupils are equal, round, and reactive to light.  Neck: Normal range of motion. Neck supple. Carotid bruit is not present. No thyromegaly present.  Cardiovascular: Normal rate, regular rhythm, normal heart sounds and intact distal pulses.  Exam reveals no gallop and no friction rub.   No murmur heard. Pulmonary/Chest: Effort normal and breath sounds normal. He has no wheezes. He has no rales.  Abdominal: Soft. Bowel sounds are normal. He exhibits no distension and no mass. There is no tenderness. There is no rebound and no guarding.  Lymphadenopathy:    He has no cervical adenopathy.  Neurological: He is alert and oriented to person, place, and time. No cranial nerve deficit.  Skin: Skin is warm and dry. No rash noted. He is not diaphoretic.  Psychiatric: He has a normal mood and affect. His behavior is normal.  Nursing note and vitals reviewed.  Results for orders placed or performed in visit on 12/06/15  CBC with Differential/Platelet  Result Value Ref Range   WBC 9.7 3.8 - 10.8 K/uL   RBC 4.69 4.20 - 5.80 MIL/uL   Hemoglobin 13.0 (L) 13.2 - 17.1 g/dL   HCT 16.1 09.6 - 04.5 %   MCV 84.4 80.0 - 100.0 fL   MCH 27.7 27.0 - 33.0 pg   MCHC 32.8 32.0 - 36.0 g/dL   RDW 40.9 81.1 - 91.4 %   Platelets 250 140 - 400 K/uL   MPV 11.1 7.5 - 12.5 fL   Neutro Abs 6,693 1,500 - 7,800 cells/uL   Lymphs Abs 1,746 850 - 3,900 cells/uL   Monocytes Absolute 1,067 (H) 200 - 950 cells/uL   Eosinophils Absolute 194 15 - 500 cells/uL   Basophils Absolute 0 0 - 200 cells/uL   Neutrophils Relative % 69 %   Lymphocytes Relative 18 %   Monocytes Relative 11 %   Eosinophils Relative 2 %   Basophils Relative 0 %   Smear Review Criteria for review not met   Comprehensive  metabolic panel  Result Value Ref Range   Sodium 132 (L) 135 - 146 mmol/L   Potassium 4.5 3.5 - 5.3 mmol/L   Chloride 98  98 - 110 mmol/L   CO2 27 20 - 31 mmol/L   Glucose, Bld 92 65 - 99 mg/dL   BUN 13 7 - 25 mg/dL   Creat 2.95 (H) 2.84 - 1.11 mg/dL   Total Bilirubin 0.4 0.2 - 1.2 mg/dL   Alkaline Phosphatase 71 40 - 115 U/L   AST 23 10 - 35 U/L   ALT 12 9 - 46 U/L   Total Protein 6.8 6.1 - 8.1 g/dL   Albumin 3.8 3.6 - 5.1 g/dL   Calcium 8.8 8.6 - 13.2 mg/dL       Assessment & Plan:   1. Essential hypertension   2. Intra-atrial reentry tachycardia   3. Typical atrial flutter (HCC)   4. Nonrheumatic aortic valve stenosis   5. Gastroesophageal reflux disease without esophagitis   6. Anal lesion   7. Anal intraepithelial neoplasia II (AIN II)   8. Gastroenteritis   9. Need for prophylactic vaccination against Streptococcus pneumoniae (pneumococcus)    -New onset gastroenteritis; s/p ED visit; doing well now and has fully recovered; ED records reviewed during visit. -s/p excisional biopsy of perianal region with AIN II-III present; resected by Andrey Campanile. -blood pressure controlled; obtain labs; continue current medications. -atrial flutter stable. -s/p Pneumovax  Orders Placed This Encounter  Procedures  . Pneumococcal polysaccharide vaccine 23-valent greater than or equal to 2yo subcutaneous/IM  . CBC with Differential/Platelet  . Comprehensive metabolic panel   No orders of the defined types were placed in this encounter.   Return in about 6 months (around 06/04/2016) for complete physical examiniation.   Antwan Bribiesca Paulita Fujita, M.D. [facility] [address] [place], [place]  [address] [phone] phone [phone] fax

## 2015-12-06 NOTE — Patient Instructions (Signed)
     IF you received an x-ray today, you will receive an invoice from [facility]. Please contact [facility] at [phone] with questions or concerns regarding your invoice.   IF you received labwork today, you will receive an invoice from [facility]. Please contact [facility] at [phone] with questions or concerns regarding your invoice.   Our billing staff will not be able to assist you with questions regarding bills from these companies.  You will be contacted with the lab results as soon as they are available. The fastest way to get your results is to activate your My Chart account. Instructions are located on the last page of this paperwork. If you have not heard from us regarding the results in 2 weeks, please contact this office.      

## 2015-12-07 LAB — CBC WITH DIFFERENTIAL/PLATELET
BASOS PCT: 0 %
Basophils Absolute: 0 cells/uL (ref 0–200)
EOS ABS: 194 {cells}/uL (ref 15–500)
Eosinophils Relative: 2 %
HEMATOCRIT: 39.6 % (ref 38.5–50.0)
HEMOGLOBIN: 13 g/dL — AB (ref 13.2–17.1)
LYMPHS ABS: 1746 {cells}/uL (ref 850–3900)
LYMPHS PCT: 18 %
MCH: 27.7 pg (ref 27.0–33.0)
MCHC: 32.8 g/dL (ref 32.0–36.0)
MCV: 84.4 fL (ref 80.0–100.0)
MONO ABS: 1067 {cells}/uL — AB (ref 200–950)
MPV: 11.1 fL (ref 7.5–12.5)
Monocytes Relative: 11 %
NEUTROS PCT: 69 %
Neutro Abs: 6693 cells/uL (ref 1500–7800)
Platelets: 250 10*3/uL (ref 140–400)
RBC: 4.69 MIL/uL (ref 4.20–5.80)
RDW: 14.6 % (ref 11.0–15.0)
WBC: 9.7 10*3/uL (ref 3.8–10.8)

## 2015-12-07 LAB — COMPREHENSIVE METABOLIC PANEL
ALBUMIN: 3.8 g/dL (ref 3.6–5.1)
ALK PHOS: 71 U/L (ref 40–115)
ALT: 12 U/L (ref 9–46)
AST: 23 U/L (ref 10–35)
BILIRUBIN TOTAL: 0.4 mg/dL (ref 0.2–1.2)
BUN: 13 mg/dL (ref 7–25)
CALCIUM: 8.8 mg/dL (ref 8.6–10.3)
CO2: 27 mmol/L (ref 20–31)
Chloride: 98 mmol/L (ref 98–110)
Creat: 1.24 mg/dL — ABNORMAL HIGH (ref 0.70–1.11)
GLUCOSE: 92 mg/dL (ref 65–99)
POTASSIUM: 4.5 mmol/L (ref 3.5–5.3)
Sodium: 132 mmol/L — ABNORMAL LOW (ref 135–146)
Total Protein: 6.8 g/dL (ref 6.1–8.1)

## 2015-12-12 DIAGNOSIS — K6282 Dysplasia of anus: Secondary | ICD-10-CM | POA: Insufficient documentation

## 2016-01-19 DIAGNOSIS — H353221 Exudative age-related macular degeneration, left eye, with active choroidal neovascularization: Secondary | ICD-10-CM | POA: Diagnosis not present

## 2016-01-20 ENCOUNTER — Telehealth: Payer: Self-pay | Admitting: Emergency Medicine

## 2016-01-20 NOTE — Telephone Encounter (Signed)
Pt came into make appt for bowel issues. Explained he would need to make new pt appt and go to urgent care or ER for the bowel issues today. He said he would go to ER if it got worse but didn't want to go see current PCP anymore. Also asked if there were any restriction on what he should be eating or drinking. Let him know he would need to be seen first and advised Urgent care or ER again.

## 2016-01-24 ENCOUNTER — Encounter: Payer: Self-pay | Admitting: Family

## 2016-01-24 ENCOUNTER — Ambulatory Visit (INDEPENDENT_AMBULATORY_CARE_PROVIDER_SITE_OTHER): Payer: Medicare Other | Admitting: Family

## 2016-01-24 VITALS — BP 124/84 | HR 79 | Temp 97.7°F | Resp 14 | Ht 72.0 in | Wt 184.0 lb

## 2016-01-24 DIAGNOSIS — K219 Gastro-esophageal reflux disease without esophagitis: Secondary | ICD-10-CM

## 2016-01-24 DIAGNOSIS — K580 Irritable bowel syndrome with diarrhea: Secondary | ICD-10-CM | POA: Diagnosis not present

## 2016-01-24 DIAGNOSIS — I10 Essential (primary) hypertension: Secondary | ICD-10-CM

## 2016-01-24 MED ORDER — CILIDINIUM-CHLORDIAZEPOXIDE 2.5-5 MG PO CAPS: 1.0000 | ORAL_CAPSULE | Freq: Two times a day (BID) | ORAL | 0 refills | Status: DC

## 2016-01-24 NOTE — Assessment & Plan Note (Signed)
Symptoms and exam are consistent with irritable bowel syndrome with concern for new change in bowel patterns as well as mild bleeding noted. Treat conservatively with Imodium and continue current dosage of Librax. Increase fiber in diet. Consider possible referral to gastroenterology or stool culture if symptoms worsen or do not improve.

## 2016-01-24 NOTE — Assessment & Plan Note (Signed)
Gastroesophageal reflux appears stable with current medication regimen taken as needed with no adverse side effects. Continue current dosage of omeprazole. Follow-up as needed.

## 2016-01-24 NOTE — Progress Notes (Signed)
Subjective:    Patient ID: Chad [name], male    DOB: November 14, 1931, 83 y.o.   MRN: [id number]  Chief Complaint  Patient presents with  . Establish Care    having problems with bowels at night, having to wear a diaper to bed, has diarrhea, x2 weeks    HPI:  Chad [name] is a 83 y.o. male who  has a past medical history of Abnormal EKG; Arthritis; Atrial flutter ([place]); Atrial tachycardia ([place]); Carotid artery calcification; Colon polyps (06/26/2005); COPD (chronic obstructive pulmonary disease) ([place]); Diverticulosis of colon (without mention of hemorrhage); Dysrhythmia; Gastritis, chronic; GERD (gastroesophageal reflux disease); Hypertension; Hyponatremia; IBS (irritable bowel syndrome); Internal and external hemorrhoids without complication; Macular degeneration of left eye; Oral cancer ([place]) (02/1997); Pneumonia; and Shortness of breath dyspnea. and presents today for an office visit to establish care.  1.) Diarrhea - This is a new problem. Associated symptom of diarrhea has been going on for about 2 weeks. Denies fevers. Previously diagnosed with irritable bowel syndrome. Timing of the symptoms is generally worse at night. Describes that when he goes to bed at night he notes he has bowel movements that are described as brown and liquid. Frequency of the bowel movements is once per day and at night varies between 1-3 times. Does not occur every night. Modifying factors include Librax which he takes every morning and occasionally in the afternoon. There is occasional abdominal discomfort that is not improved with a bowel movement. No melena. Does question a little blood after a shower on occasion. Has been seen by general surgery for an anal mass. Last colonoscopy was in 2012 which was normal.   2.) Hypertension - Currently maintained on lisinopril and diltiazem. Reports taking medication as prescribed and denies adverse side effects or hypotensive readings. Denies any symptoms of end organ  damage or worst headache of life. Does not currently check blood pressure at home a regular basis. Continues to work on following a low-sodium diet.  BP Readings from Last 3 Encounters:  01/24/16 124/84  12/06/15 (!) 144/88  11/22/15 111/73    3.) Gastroesophageal reflux - currently maintained on omeprazole. Reports taking medication as prescribed and denies adverse side effects. General takes medication as needed basis as his symptoms are generally well controlled.   Allergies  Allergen Reactions  . Dyazide [Hydrochlorothiazide W-Triamterene] Other (See Comments)    Caused hyponatremia 04/2012      Outpatient Medications Prior to Visit  Medication Sig Dispense Refill  . cholecalciferol (VITAMIN D) 1000 units tablet Take 3,000 Units by mouth daily.    . cyanocobalamin 1000 MCG tablet Take 1,000 mcg by mouth daily.    [name] diltiazem (CARDIZEM) 30 MG tablet Take 1 tablet (30 mg total) by mouth every 6 (six) hours as needed. For palpitations. 30 tablet 11  . fluticasone (FLONASE) 50 MCG/ACT nasal spray Place 2 sprays into both nostrils daily. 48 g 3  . gabapentin (NEURONTIN) 300 MG capsule Take 3 capsules at bedtime. 270 capsule 3  . lisinopril (PRINIVIL,ZESTRIL) 10 MG tablet Take 1 tablet (10 mg total) by mouth daily. 90 tablet 3  . loperamide (IMODIUM) 2 MG capsule Take 1 capsule (2 mg total) by mouth 4 (four) times daily as needed for diarrhea or loose stools. 12 capsule 0  . multivitamin-lutein (OCUVITE-LUTEIN) CAPS capsule Take 1 capsule by mouth daily.    [name] omeprazole (PRILOSEC) 20 MG capsule Take 1 capsule (20 mg total) by mouth daily. Take one hour before breakfast 90 capsule  3  . ondansetron (ZOFRAN) 4 MG tablet Take 1 tablet (4 mg total) by mouth every 6 (six) hours as needed for nausea or vomiting. 4 tablet 0   No facility-administered medications prior to visit.      Past Medical History:  Diagnosis Date  . Abnormal EKG    ST changes with normal coronaries by cath 05/06/12.   . Arthritis    "hands" (01/12/2013); feet (podiatry consultation)  . Atrial flutter ([place])    a. Slow ~100bpm when in 2:1; dx 04/2012. b. Placed on Xarelto.  c. s/p ablation 07-08-2012 by Dr Rayann Heman  . Atrial tachycardia ([place])   . Carotid artery calcification    By CXR - dopplers 05/06/12 without obvious evidence of significant ICA stenosis >40%  . Colon polyps 06/26/2005  . COPD (chronic obstructive pulmonary disease) ([place])   . Diverticulosis of colon (without mention of hemorrhage)   . Dysrhythmia   . Gastritis, chronic    Pt denies bleeding 04/2012. EGD 2012 reportedly normal.  . GERD (gastroesophageal reflux disease)   . Hypertension   . Hyponatremia    04/2012 r/t diuretic.  . IBS (irritable bowel syndrome)   . Internal and external hemorrhoids without complication   . Macular degeneration of left eye   . Oral cancer ([place]) 02/1997  . Pneumonia    "couple times" (01/12/2013)  . Shortness of breath dyspnea    walking distances      Past Surgical History:  Procedure Laterality Date  . ATRIAL FLUTTER ABLATION  07-08-2012   of typical atrial flutter by Dr Rayann Heman  . ATRIAL FLUTTER ABLATION N/A 07/08/2012   Procedure: ATRIAL FLUTTER ABLATION;  Surgeon: Thompson Grayer, MD;  Location: [facility] CATH LAB;  Service: Cardiovascular;  Laterality: N/A;  . CARDIAC CATHETERIZATION  04/2012  . COLONOSCOPY  08/29/10   diverticulosis, internal hemorrhoids  . ESOPHAGOGASTRODUODENOSCOPY  09/20/10   normal  . EYE SURGERY  01/08/2014   Cataract resection L.    [name] [place]  . LEFT HEART CATHETERIZATION WITH CORONARY ANGIOGRAM N/A 05/06/2012   Procedure: LEFT HEART CATHETERIZATION WITH CORONARY ANGIOGRAM;  Surgeon: Peter M Martinique, MD;  Location: [facility] CATH LAB;  Service: Cardiovascular;  Laterality: N/A;  . 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 TRIGGER FINGER RELEASE Right 1970's   "2 fingers"  . WART FULGURATION Left 09/15/2015   Procedure: EXCISIONal biospy of left peri anual and anual  canal mass;  Surgeon: Greer Pickerel, MD;  Location: [facility] ORS;  Service: General;  Laterality: Left;      Family History  Problem Relation Age of Onset  . Heart disease Brother   . Throat cancer Brother   . Other Mother     UNSURE  . Other Father     UNSURE  . Emphysema Sister   . Emphysema Sister   . Other Brother     UNSURE  . Diabetes        Social History   Social History  . Marital status: Divorced    Spouse name: N/A  . Number of children: 2  . Years of education: 16   Occupational History  . Retired    Social History Main Topics  . Smoking status: Former Smoker    Packs/day: 0.50    Years: 20.00    Types: Cigarettes, Pipe, Cigars    Quit date: 01/08/1966  . Smokeless tobacco: Never Used     Comment:    . Alcohol use 0.0 oz/week  Comment: 01/12/2013 "did drink 2-3 12 oz beers a day; stopped drinking in 04/2012"  . Drug use: No  . Sexual activity: No   Other Topics Concern  . Not on file   Social History Narrative   Marital status: divorced; lives with ex-wife; not dating in 2017.      Children:  2 sons, 2 grandsons, 1 granddaughter; no gg.      Lives: with ex-wife in house.      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      Tobacco: quit smoking 1997; smoked for 27 years.      Alcohol: quit 2004      Exercise:  Walking daily short distances.       ADLs:  NO assistant devices; has cane if needs it; drives; pays bill; grocery shopping by wife; wife cleans house and does laundry.     Advanced Directives:  +LIVING WILL; +FULL CODE.  HCPOA: oldest son Paytin Wiersema Sr.)         Review of Systems  Constitutional: Negative for chills and fever.  Eyes:       Negative for changes in vision  Respiratory: Negative for cough, chest tightness and wheezing.   Cardiovascular: Negative for chest pain, palpitations and leg swelling.  Gastrointestinal: Positive for diarrhea. Negative for abdominal  distention, abdominal pain, anal bleeding, blood in stool, constipation, nausea and vomiting.  Neurological: Negative for dizziness, weakness and light-headedness.       Objective:    BP 124/84 (BP Location: Left Arm, Patient Position: Sitting, Cuff Size: Normal)   Pulse 79   Temp 97.7 F (36.5 C) (Oral)   Resp 14   Ht 6' (1.829 m)   Wt 184 lb (83.5 kg)   SpO2 92%   BMI 24.95 kg/m  Nursing note and vital signs reviewed.  Physical Exam  Constitutional: He is oriented to person, place, and time. He appears well-developed and well-nourished. No distress.  Cardiovascular: Normal rate, regular rhythm, normal heart sounds and intact distal pulses.   Pulmonary/Chest: Effort normal and breath sounds normal.  Abdominal: Normal appearance and bowel sounds are normal. He exhibits no mass. There is no hepatosplenomegaly. There is no tenderness. There is no rigidity, no guarding, no tenderness at McBurney's point and negative Murphy's sign.  Neurological: He is alert and oriented to person, place, and time.  Skin: Skin is warm and dry.  Psychiatric: He has a normal mood and affect. His behavior is normal. Judgment and thought content normal.        Assessment & Plan:   Problem List Items Addressed This Visit      Cardiovascular and Mediastinum   Essential hypertension    Blood pressure appears adequate control with current medication regimen and no adverse side effects and below goal 140/90. Continue current dosage of diltiazem and lisinopril. Encouraged monitor blood pressure at home and follow low-sodium diet. Denies worst headache of life with no new symptoms of end organ damage noted. Continue to monitor.        Digestive   GERD - Primary    Gastroesophageal reflux appears stable with current medication regimen taken as needed with no adverse side effects. Continue current dosage of omeprazole. Follow-up as needed.      Relevant Medications   clidinium-chlordiazePOXIDE (LIBRAX)  5-2.5 MG capsule   IBS    Symptoms and exam are consistent with irritable bowel syndrome with concern for new change  in bowel patterns as well as mild bleeding noted. Treat conservatively with Imodium and continue current dosage of Librax. Increase fiber in diet. Consider possible referral to gastroenterology or stool culture if symptoms worsen or do not improve.      Relevant Medications   clidinium-chlordiazePOXIDE (LIBRAX) 5-2.5 MG capsule       I have discontinued Mr. Angon ondansetron. I have also changed his clidinium-chlordiazePOXIDE. Additionally, I am having him maintain his omeprazole, lisinopril, gabapentin, fluticasone, multivitamin-lutein, diltiazem, cholecalciferol, cyanocobalamin, and loperamide.   Meds ordered this encounter  Medications  . DISCONTD: clidinium-chlordiazePOXIDE (LIBRAX) 5-2.5 MG capsule    Sig: Take 1 capsule by mouth.  . clidinium-chlordiazePOXIDE (LIBRAX) 5-2.5 MG capsule    Sig: Take 1 capsule by mouth 2 (two) times daily.    Dispense:  60 capsule    Refill:  0    Order Specific Question:   Supervising Provider    Answer:   Pricilla Holm A [id number]     Follow-up: Return in about 1 month (around 02/24/2016), or if symptoms worsen or fail to improve.  Mauricio Po, FNP

## 2016-01-24 NOTE — Assessment & Plan Note (Signed)
Blood pressure appears adequate control with current medication regimen and no adverse side effects and below goal 140/90. Continue current dosage of diltiazem and lisinopril. Encouraged monitor blood pressure at home and follow low-sodium diet. Denies worst headache of life with no new symptoms of end organ damage noted. Continue to monitor.

## 2016-01-24 NOTE — Patient Instructions (Signed)
Thank you for choosing [facility].  SUMMARY AND INSTRUCTIONS:  Medication:  Please continue to take your medications as prescribed.   Please use immodium before bed as needed.   Take your second dose of Librax in the evening.   Increase fiber in your diet - metamucil or benefiber to increase bulk in your stool.  If your symptoms worsen please let us know.  Your prescription(s) have been submitted to your pharmacy or been printed and provided for you. Please take as directed and contact our office if you believe you are having problem(s) with the medication(s) or have any questions.  Follow up:  If your symptoms worsen or fail to improve, please contact our office for further instruction, or in case of emergency go directly to the emergency room at the closest medical facility.    Diarrhea, Adult Introduction Diarrhea is when you have loose and water poop (stool) often. Diarrhea can make you feel weak and cause you to get dehydrated. Dehydration can make you tired and thirsty, make you have a dry mouth, and make it so you pee (urinate) less often. Diarrhea often lasts 2-3 days. However, it can last longer if it is a sign of something more serious. It is important to treat your diarrhea as told by your doctor. Follow these instructions at home: Eating and drinking Follow these recommendations as told by your doctor:  Take an oral rehydration solution (ORS). This is a drink that is sold at pharmacies and stores.  Drink clear fluids, such as:  Water.  Ice chips.  Diluted fruit juice.  Low-calorie sports drinks.  Eat bland, easy-to-digest foods in small amounts as you are able. These foods include:  Bananas.  Applesauce.  Rice.  Low-fat (lean) meats.  Toast.  Crackers.  Avoid drinking fluids that have a lot of sugar or caffeine in them.  Avoid alcohol.  Avoid spicy or fatty foods. General instructions  Drink enough fluid to keep your pee (urine) clear  or pale yellow.  Wash your hands often. If you cannot use soap and water, use hand sanitizer.  Make sure that all people in your home wash their hands well and often.  Take over-the-counter and prescription medicines only as told by your doctor.  Rest at home while you get better.  Watch your condition for any changes.  Take a warm bath to help with any burning or pain from having diarrhea.  Keep all follow-up visits as told by your doctor. This is important. Contact a doctor if:  You have a fever.  Your diarrhea gets worse.  You have new symptoms.  You cannot keep fluids down.  You feel light-headed or dizzy.  You have a headache.  You have muscle cramps. Get help right away if:  You have chest pain.  You feel very weak or you pass out (faint).  You have bloody or black poop or poop that look like tar.  You have very bad pain, cramping, or bloating in your belly (abdomen).  You have trouble breathing or you are breathing very quickly.  Your heart is beating very quickly.  Your skin feels cold and clammy.  You feel confused.  You have signs of dehydration, such as:  Dark pee, hardly any pee, or no pee.  Cracked lips.  Dry mouth.  Sunken eyes.  Sleepiness.  Weakness. This information is not intended to replace advice given to you by your health care provider. Make sure you discuss any questions you have with your  health care provider. Document Released: 06/13/2007 Document Revised: 07/15/2015 Document Reviewed: 08/31/2014  2017 [employer]vier

## 2016-01-29 ENCOUNTER — Encounter (HOSPITAL_COMMUNITY): Payer: Self-pay | Admitting: Emergency Medicine

## 2016-01-29 ENCOUNTER — Emergency Department (HOSPITAL_COMMUNITY): Payer: Medicare Other

## 2016-01-29 ENCOUNTER — Emergency Department (HOSPITAL_COMMUNITY)
Admission: EM | Admit: 2016-01-29 | Discharge: 2016-01-29 | Disposition: A | Discharge status: Home / Self Care | Payer: Medicare Other | Attending: Emergency Medicine | Admitting: Emergency Medicine

## 2016-01-29 DIAGNOSIS — I10 Essential (primary) hypertension: Secondary | ICD-10-CM | POA: Insufficient documentation

## 2016-01-29 DIAGNOSIS — J449 Chronic obstructive pulmonary disease, unspecified: Secondary | ICD-10-CM | POA: Diagnosis not present

## 2016-01-29 DIAGNOSIS — Z85819 Personal history of malignant neoplasm of unspecified site of lip, oral cavity, and pharynx: Secondary | ICD-10-CM | POA: Diagnosis not present

## 2016-01-29 DIAGNOSIS — Z87891 Personal history of nicotine dependence: Secondary | ICD-10-CM | POA: Insufficient documentation

## 2016-01-29 DIAGNOSIS — S299XXA Unspecified injury of thorax, initial encounter: Secondary | ICD-10-CM | POA: Diagnosis present

## 2016-01-29 DIAGNOSIS — Y929 Unspecified place or not applicable: Secondary | ICD-10-CM | POA: Insufficient documentation

## 2016-01-29 DIAGNOSIS — Y939 Activity, unspecified: Secondary | ICD-10-CM | POA: Diagnosis not present

## 2016-01-29 DIAGNOSIS — Y999 Unspecified external cause status: Secondary | ICD-10-CM | POA: Diagnosis not present

## 2016-01-29 DIAGNOSIS — S2232XA Fracture of one rib, left side, initial encounter for closed fracture: Secondary | ICD-10-CM | POA: Insufficient documentation

## 2016-01-29 DIAGNOSIS — W000XXA Fall on same level due to ice and snow, initial encounter: Secondary | ICD-10-CM | POA: Diagnosis not present

## 2016-01-29 MED ORDER — ACETAMINOPHEN 500 MG PO TABS
1000.0000 mg | ORAL_TABLET | Freq: Once | ORAL | Status: AC
  Administered 2016-01-29: 1000 mg via ORAL
  Filled 2016-01-29: qty 2

## 2016-01-29 MED ORDER — HYDROCODONE-ACETAMINOPHEN 5-325 MG PO TABS: 0.5000 | ORAL_TABLET | ORAL | 0 refills | Status: DC | PRN

## 2016-01-29 MED ORDER — DICLOFENAC SODIUM 1 % TD GEL: 4.0000 g | Freq: Four times a day (QID) | TRANSDERMAL | 1 refills | Status: DC

## 2016-01-29 NOTE — ED Triage Notes (Signed)
Fell on ice yesterday and now having left rib pain  And left hip pain ,  Thinks he hit the gutter, more flank pain hurts to cough and walk

## 2016-01-29 NOTE — Discharge Instructions (Signed)
Have a fracture of your 8 left rib today. You were given Tylenol in the emergency room. You can take 2 of the Tylenol arthritis (1000mg ) every 4-6 hours for pain and use the gel by rubbing it where it hurts. If the pain is not controlled with that can take the pain pill. Start with half and increase to a whole tab based on how badger pain is. If you having to take the pain medicine regularly make sure you're using a stool softener to prevent constipation. Return to the emergency room immediately if you develop shortness of breath or fever.

## 2016-01-29 NOTE — ED Notes (Signed)
Pt comfortable with discharge and follow up instructions. Rx x2

## 2016-01-29 NOTE — ED Provider Notes (Signed)
[facility] DEPT Provider Note   CSN: [id number] Arrival date & time: 01/29/16  1033     History   Chief Complaint Chief Complaint  Patient presents with  . Fall  . Chest Pain    HPI Chad [name] is a 83 y.o. male.  Patient was walking on the sidewalk yesterday around 11:30 AM and slipped on the ice falling on his left side and hitting his ribs. He did not hit his head or lose consciousness. He has had not had any medication for the pain. He denies any shortness of breath. He did not have any pain in his abdomen, upper or lower extremities. The pain is localized to the left ribs.   The history is provided by the patient.  Fall  This is a new problem. The current episode started yesterday. The problem occurs constantly. The problem has been gradually worsening. Associated symptoms include chest pain. Pertinent negatives include no abdominal pain and no shortness of breath. The symptoms are aggravated by coughing, bending and twisting. Nothing relieves the symptoms. He has tried nothing for the symptoms. The treatment provided no relief.  Chest Pain   Pertinent negatives include no abdominal pain and no shortness of breath.    Past Medical History:  Diagnosis Date  . Abnormal EKG    ST changes with normal coronaries by cath 05/06/12.  . Arthritis    "hands" (01/12/2013); feet (podiatry consultation)  . Atrial flutter ([place])    a. Slow ~100bpm when in 2:1; dx 04/2012. b. Placed on Xarelto.  c. s/p ablation 07-08-2012 by Dr Rayann Heman  . Atrial tachycardia ([place])   . Carotid artery calcification    By CXR - dopplers 05/06/12 without obvious evidence of significant ICA stenosis >40%  . Colon polyps 06/26/2005  . COPD (chronic obstructive pulmonary disease) ([place])   . Diverticulosis of colon (without mention of hemorrhage)   . Dysrhythmia   . Gastritis, chronic    Pt denies bleeding 04/2012. EGD 2012 reportedly normal.  . GERD (gastroesophageal reflux disease)   . Hypertension   .  Hyponatremia    04/2012 r/t diuretic.  . IBS (irritable bowel syndrome)   . Internal and external hemorrhoids without complication   . Macular degeneration of left eye   . 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 Pneumonia    "couple times" (01/12/2013)  . Shortness of breath dyspnea    walking distances    Patient Active Problem List   Diagnosis Date Noted  . 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 Anal lesion 06/07/2015  . Gas 01/18/2015  . Aortic stenosis 06/08/2013  . Unsteadiness 02/11/2013  . Ectopic atrial tachycardia ([place]) 01/12/2013  . Atrial tachycardia ([place]) 01/12/2013  . Atrial flutter ([place]) 05/06/2012  . Aortic valve sclerosis 05/06/2012  . Acute renal failure ([place]) 05/05/2012  . 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 Anemia 07/27/2010  . Essential hypertension 11/15/2008  . COPD 11/15/2008  . GERD 11/15/2008  . IBS 11/15/2008    Past Surgical History:  Procedure Laterality Date  . ATRIAL FLUTTER ABLATION  07-08-2012   of typical atrial flutter by Dr Rayann Heman  . ATRIAL FLUTTER ABLATION N/A 07/08/2012   Procedure: ATRIAL FLUTTER ABLATION;  Surgeon: Thompson Grayer, MD;  Location: [facility] CATH LAB;  Service: Cardiovascular;  Laterality: N/A;  . CARDIAC CATHETERIZATION  04/2012  . COLONOSCOPY  08/29/10   diverticulosis, internal hemorrhoids  . ESOPHAGOGASTRODUODENOSCOPY  09/20/10   normal  .  EYE SURGERY  01/08/2014   Cataract resection L.    [name] [place]  . LEFT HEART CATHETERIZATION WITH CORONARY ANGIOGRAM N/A 05/06/2012   Procedure: LEFT HEART CATHETERIZATION WITH CORONARY ANGIOGRAM;  Surgeon: Peter M Martinique, MD;  Location: [facility] CATH LAB;  Service: Cardiovascular;  Laterality: N/A;  . 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 TRIGGER FINGER RELEASE Right 1970's   "2 fingers"  . WART FULGURATION Left 09/15/2015   Procedure: EXCISIONal biospy of left peri anual and anual canal mass;  Surgeon:  Greer Pickerel, MD;  Location: [facility] ORS;  Service: General;  Laterality: Left;       Home Medications    Prior to Admission medications   Medication Sig Start Date End Date Taking? Authorizing Provider  cholecalciferol (VITAMIN D) 1000 units tablet Take 3,000 Units by mouth daily.    Historical Provider, MD  clidinium-chlordiazePOXIDE (LIBRAX) 5-2.5 MG capsule Take 1 capsule by mouth 2 (two) times daily. 01/24/16   Gold[place] Circle, FNP  cyanocobalamin 1000 MCG tablet Take 1,000 mcg by mouth daily.    Historical Provider, MD  diltiazem (CARDIZEM) 30 MG tablet Take 1 tablet (30 mg total) by mouth every 6 (six) hours as needed. For palpitations. 10/20/15   Lelon Perla, MD  fluticasone (FLONASE) 50 MCG/ACT nasal spray Place 2 sprays into both nostrils daily. 05/31/15   Wardell Honour, MD  gabapentin (NEURONTIN) 300 MG capsule Take 3 capsules at bedtime. 05/31/15   Wardell Honour, MD  lisinopril (PRINIVIL,ZESTRIL) 10 MG tablet Take 1 tablet (10 mg total) by mouth daily. 05/31/15   Wardell Honour, MD  loperamide (IMODIUM) 2 MG capsule Take 1 capsule (2 mg total) by mouth 4 (four) times daily as needed for diarrhea or loose stools. 11/22/15   Nicole Pisciotta, PA-C  multivitamin-lutein (OCUVITE-LUTEIN) CAPS capsule Take 1 capsule by mouth daily.    Historical Provider, MD  omeprazole (PRILOSEC) 20 MG capsule Take 1 capsule (20 mg total) by mouth daily. Take one hour before breakfast 05/31/15   Wardell Honour, MD    Family History Family History  Problem Relation Age of Onset  . Heart disease Brother   . Throat cancer Brother   . Other Mother     UNSURE  . Other Father     UNSURE  . Emphysema Sister   . Emphysema Sister   . Other Brother     UNSURE  . Diabetes      Social History Social History  Substance Use Topics  . Smoking status: Former Smoker    Packs/day: 0.50    Years: 20.00    Types: Cigarettes, Pipe, Cigars    Quit date: 01/08/1966  . Smokeless tobacco: Never Used      Comment:    . Alcohol use 0.0 oz/week     Comment: 01/12/2013 "did drink 2-3 12 oz beers a day; stopped drinking in 04/2012"     Allergies   Dyazide [hydrochlorothiazide w-triamterene]   Review of Systems Review of Systems  Respiratory: Negative for shortness of breath.   Cardiovascular: Positive for chest pain.  Gastrointestinal: Negative for abdominal pain.  All other systems reviewed and are negative.    Physical Exam Updated Vital Signs BP 115/80 (BP Location: Right Arm)   Pulse (!) 130   Temp 97.8 F (36.6 C) (Oral)   Resp 18   SpO2 97%   Physical Exam  Constitutional: He is oriented to person, place, and time. He  appears well-developed and well-nourished. No distress.  HENT:  Head: Normocephalic and atraumatic.  Mouth/Throat: Oropharynx is clear and moist.  Eyes: Conjunctivae and EOM are normal. Pupils are equal, round, and reactive to light.  Neck: Normal range of motion. Neck supple.  Cardiovascular: Regular rhythm and intact distal pulses.  Tachycardia present.   No murmur heard. Pulmonary/Chest: Effort normal and breath sounds normal. No respiratory distress. He has no wheezes. He has no rales. He exhibits tenderness. He exhibits no crepitus, no edema and no swelling.    Abdominal: Soft. He exhibits no distension. There is no tenderness. There is no rebound and no guarding.  No flank tenderness  Musculoskeletal: Normal range of motion. He exhibits no edema or tenderness.       Right shoulder: Normal.       Left shoulder: Normal.       Right hip: Normal.       Left hip: Normal.  Neurological: He is alert and oriented to person, place, and time. He has normal strength. No sensory deficit.  Skin: Skin is warm and dry. No rash noted. No erythema.  Psychiatric: He has a normal mood and affect. His behavior is normal.  Nursing note and vitals reviewed.    ED Treatments / Results  Labs (all labs ordered are listed, but only abnormal results are displayed) Labs  Reviewed - No data to display  EKG  EKG Interpretation  Date/Time:  Sunday January 29 2016 11:00:14 EST Ventricular Rate:  120 PR Interval:    QRS Duration: 112 QT Interval:  336 QTC Calculation: 474 R Axis:   -55 Text Interpretation:  Accelerated Junctional rhythm Left axis deviation No significant change since last tracing Confirmed by Maryan Rued  MD, Loree Fee ([id number]) on 01/29/2016 11:52:51 AM       Radiology Dg Ribs Unilateral W/chest Left  Result Date: 01/29/2016 CLINICAL DATA:  Status post fall, left hip pain and rib pain. EXAM: LEFT RIBS AND CHEST - 3+ VIEW COMPARISON:  None. FINDINGS: Nondisplaced left posterolateral eighth rib fracture. There is no evidence of pneumothorax or pleural effusion. Left basilar scarring. Lungs are otherwise clear. Heart size and mediastinal contours are within normal limits. IMPRESSION: 1. Acute nondisplaced left posterolateral eighth rib fracture. Electronically Signed   By: Kathreen Devoid   On: 01/29/2016 12:27    Procedures Procedures (including critical care time)  Medications Ordered in ED Medications - No data to display   Initial Impression / Assessment and Plan / ED Course  I have reviewed the triage vital signs and the nursing notes.  Pertinent labs & imaging results that were available during my care of the patient were reviewed by me and considered in my medical decision making (see chart for details).     Patient is an elderly gentleman who had a mechanical fall on the ice yesterday complaining of left-sided rib pain. He denies significant shortness of breath but does have pain with coughing, moving his left arm or taking a deep breath. He denies hitting his head or losing consciousness. He does not take anticoagulation. He has been able to ambulate without difficulty. On exam he has no abdominal or flank tenderness. Significant tenderness over the left lateral ribs. he was offered pain medication including Tylenol and he refused.   Patient is tachycardic upon arrival here. Rate of 120. Most likely sinus as it is regular the patient does have a prior history of atrial flutter requiring ablation but is not currently taking any anticoagulation. Test x-ray with  rib films pending.  12:56 PM Imaging shows left eighth rib fracture. Findings discussed with the patient. Recurrent heart rate 61. Patient was given Tylenol, Voltaren gel and Vicodin as needed for breakthrough pain.  Final Clinical Impressions(s) / ED Diagnoses   Final diagnoses:  Closed fracture of one rib of left side, initial encounter    New Prescriptions New Prescriptions   DICLOFENAC SODIUM (VOLTAREN) 1 % GEL    Apply 4 g topically 4 (four) times daily.   HYDROCODONE-ACETAMINOPHEN (NORCO/VICODIN) 5-325 MG TABLET    Take 0.5-1 tablets by mouth every 4 (four) hours as needed.     Blanchie Dessert, MD 01/29/16 1301

## 2016-02-04 ENCOUNTER — Ambulatory Visit (INDEPENDENT_AMBULATORY_CARE_PROVIDER_SITE_OTHER): Payer: Medicare Other

## 2016-02-04 ENCOUNTER — Ambulatory Visit (INDEPENDENT_AMBULATORY_CARE_PROVIDER_SITE_OTHER): Payer: Medicare Other | Admitting: Emergency Medicine

## 2016-02-04 VITALS — BP 128/80 | HR 77 | Temp 97.6°F | Resp 18 | Ht 73.0 in | Wt 182.4 lb

## 2016-02-04 DIAGNOSIS — R0781 Pleurodynia: Secondary | ICD-10-CM | POA: Diagnosis not present

## 2016-02-04 DIAGNOSIS — S2232XD Fracture of one rib, left side, subsequent encounter for fracture with routine healing: Secondary | ICD-10-CM

## 2016-02-04 DIAGNOSIS — S2232XA Fracture of one rib, left side, initial encounter for closed fracture: Secondary | ICD-10-CM | POA: Diagnosis not present

## 2016-02-04 NOTE — Patient Instructions (Addendum)
     IF you received an x-ray today, you will receive an invoice from [facility]. Please contact [facility] at [phone] with questions or concerns regarding your invoice.   IF you received labwork today, you will receive an invoice from [facility]. Please contact [facility] at [phone] with questions or concerns regarding your invoice.   Our billing staff will not be able to assist you with questions regarding bills from these companies.  You will be contacted with the lab results as soon as they are available. The fastest way to get your results is to activate your My Chart account. Instructions are located on the last page of this paperwork. If you have not heard from [place] regarding the results in 2 weeks, please contact this office.      Rib Fracture A rib fracture is a break or crack in one of the bones of the ribs. The ribs are like a cage that goes around your upper chest. A broken or cracked rib is often painful, but most do not cause other problems. Most rib fractures heal on their own in 1-3 months. Follow these instructions at home:  Avoid activities that cause pain to the injured area. Protect your injured area.  Slowly increase activity as told by your doctor.  Take medicine as told by your doctor.  Put ice on the injured area for the first 1-2 days after you have been treated or as told by your doctor.  Put ice in a plastic bag.  Place a towel between your skin and the bag.  Leave the ice on for 15-20 minutes at a time, every 2 hours while you are awake.  Do deep breathing as told by your doctor. You may be told to:  Take deep breaths many times a day.  Cough many times a day while hugging a pillow.  Use a device (incentive spirometer) to perform deep breathing many times a day.  Drink enough fluids to keep your pee (urine) clear or pale yellow.  Do not wear a rib belt or binder. These do not allow you to breathe deeply. Get help right  away if:  You have a fever.  You have trouble breathing.  You cannot stop coughing.  You cough up thick or bloody spit (mucus).  You feel sick to your stomach (nauseous), throw up (vomit), or have belly (abdominal) pain.  Your pain gets worse and medicine does not help. This information is not intended to replace advice given to you by your health care provider. Make sure you discuss any questions you have with your health care provider. Document Released: 10/04/2007 Document Revised: 06/02/2015 Document Reviewed: 02/27/2012 Elsevier Interactive Patient Education  2017 [employer].

## 2016-02-04 NOTE — Progress Notes (Signed)
Chad [name] 83 y.o.   Chief Complaint  Patient presents with  . Follow-up    follow-up from rib fx (was seen on 1/21 @ ED)    HISTORY OF PRESENT ILLNESS: This is a 83 y.o. male here for f/u rib fracture left side. Doing well. No new symptoms or new concerns. Pain improved.  HPI   Prior to Admission medications   Medication Sig Start Date End Date Taking? Authorizing Provider  cholecalciferol (VITAMIN D) 1000 units tablet Take 3,000 Units by mouth daily.   Yes Historical Provider, MD  clidinium-chlordiazePOXIDE (LIBRAX) 5-2.5 MG capsule Take 1 capsule by mouth 2 (two) times daily. 01/24/16  Yes Golden Circle, FNP  cyanocobalamin 1000 MCG tablet Take 1,000 mcg by mouth daily.   Yes Historical Provider, MD  diclofenac sodium (VOLTAREN) 1 % GEL Apply 4 g topically 4 (four) times daily. 01/29/16  Yes Blanchie Dessert, MD  diltiazem (CARDIZEM) 30 MG tablet Take 1 tablet (30 mg total) by mouth every 6 (six) hours as needed. For palpitations. 10/20/15  Yes Lelon Perla, MD  fluticasone (FLONASE) 50 MCG/ACT nasal spray Place 2 sprays into both nostrils daily. 05/31/15  Yes Wardell Honour, MD  gabapentin (NEURONTIN) 300 MG capsule Take 3 capsules at bedtime. 05/31/15  Yes Wardell Honour, MD  HYDROcodone-acetaminophen (NORCO/VICODIN) 5-325 MG tablet Take 0.5-1 tablets by mouth every 4 (four) hours as needed. 01/29/16  Yes Blanchie Dessert, MD  lisinopril (PRINIVIL,ZESTRIL) 10 MG tablet Take 1 tablet (10 mg total) by mouth daily. 05/31/15  Yes Wardell Honour, MD  loperamide (IMODIUM) 2 MG capsule Take 1 capsule (2 mg total) by mouth 4 (four) times daily as needed for diarrhea or loose stools. 11/22/15  Yes Nicole Pisciotta, PA-C  multivitamin-lutein (OCUVITE-LUTEIN) CAPS capsule Take 1 capsule by mouth daily.   Yes Historical Provider, MD  omeprazole (PRILOSEC) 20 MG capsule Take 1 capsule (20 mg total) by mouth daily. Take one hour before breakfast 05/31/15  Yes Wardell Honour, MD     Allergies  Allergen Reactions  . Dyazide [Hydrochlorothiazide W-Triamterene] Other (See Comments)    Caused hyponatremia 04/2012    Patient Active Problem List   Diagnosis Date Noted  . 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 Anal lesion 06/07/2015  . Gas 01/18/2015  . Aortic stenosis 06/08/2013  . Unsteadiness 02/11/2013  . Ectopic atrial tachycardia ([place]) 01/12/2013  . Atrial tachycardia ([place]) 01/12/2013  . Atrial flutter ([place]) 05/06/2012  . Aortic valve sclerosis 05/06/2012  . Acute renal failure ([place]) 05/05/2012  . 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 Anemia 07/27/2010  . Essential hypertension 11/15/2008  . COPD 11/15/2008  . GERD 11/15/2008  . IBS 11/15/2008    Past Medical History:  Diagnosis Date  . Abnormal EKG    ST changes with normal coronaries by cath 05/06/12.  . Arthritis    "hands" (01/12/2013); feet (podiatry consultation)  . Atrial flutter ([place])    a. Slow ~100bpm when in 2:1; dx 04/2012. b. Placed on Xarelto.  c. s/p ablation 07-08-2012 by Dr Rayann Heman  . Atrial tachycardia ([place])   . Carotid artery calcification    By CXR - dopplers 05/06/12 without obvious evidence of significant ICA stenosis >40%  . Colon polyps 06/26/2005  . COPD (chronic obstructive pulmonary disease) ([place])   . Diverticulosis of colon (without mention of hemorrhage)   . Dysrhythmia   . Gastritis, chronic    Pt denies  bleeding 04/2012. EGD 2012 reportedly normal.  . GERD (gastroesophageal reflux disease)   . Hypertension   . Hyponatremia    04/2012 r/t diuretic.  . IBS (irritable bowel syndrome)   . Internal and external hemorrhoids without complication   . Macular degeneration of left eye   . Oral cancer ([place]) 02/1997  . Pneumonia    "couple times" (01/12/2013)  . Shortness of breath dyspnea    walking distances    Past Surgical History:  Procedure Laterality Date  . ATRIAL FLUTTER ABLATION  07-08-2012    of typical atrial flutter by Dr Rayann Heman  . ATRIAL FLUTTER ABLATION N/A 07/08/2012   Procedure: ATRIAL FLUTTER ABLATION;  Surgeon: Thompson Grayer, MD;  Location: [facility] CATH LAB;  Service: Cardiovascular;  Laterality: N/A;  . CARDIAC CATHETERIZATION  04/2012  . COLONOSCOPY  08/29/10   diverticulosis, internal hemorrhoids  . ESOPHAGOGASTRODUODENOSCOPY  09/20/10   normal  . EYE SURGERY  01/08/2014   Cataract resection L.    [name] [place]  . LEFT HEART CATHETERIZATION WITH CORONARY ANGIOGRAM N/A 05/06/2012   Procedure: LEFT HEART CATHETERIZATION WITH CORONARY ANGIOGRAM;  Surgeon: Peter M Martinique, MD;  Location: [facility] CATH LAB;  Service: Cardiovascular;  Laterality: N/A;  . 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 TRIGGER FINGER RELEASE Right 1970's   "2 fingers"  . WART FULGURATION Left 09/15/2015   Procedure: EXCISIONal biospy of left peri anual and anual canal mass;  Surgeon: Greer Pickerel, MD;  Location: [facility] ORS;  Service: General;  Laterality: Left;    Social History   Social History  . Marital status: Divorced    Spouse name: N/A  . Number of children: 2  . Years of education: 16   Occupational History  . Retired    Social History Main Topics  . Smoking status: Former Smoker    Packs/day: 0.50    Years: 20.00    Types: Cigarettes, Pipe, Cigars    Quit date: 01/08/1966  . Smokeless tobacco: Never Used     Comment:    . Alcohol use 0.0 oz/week     Comment: 01/12/2013 "did drink 2-3 12 oz beers a day; stopped drinking in 04/2012"  . Drug use: No  . Sexual activity: No   Other Topics Concern  . Not on file   Social History Narrative   Marital status: divorced; lives with ex-wife; not dating in 2017.      Children:  2 sons, 2 grandsons, 1 granddaughter; no gg.      Lives: with ex-wife in house.      Employment: retired first time age 35; retired in 1997.  Barista x 25 years; Actor.      Tobacco: quit smoking 1997; smoked for 27 years.       Alcohol: quit 2004      Exercise:  Walking daily short distances.       ADLs:  NO assistant devices; has cane if needs it; drives; pays bill; grocery shopping by wife; wife cleans house and does laundry.     Advanced Directives:  +LIVING WILL; +FULL CODE.  HCPOA: oldest son Kentral Septer Sr.)       Family History  Problem Relation Age of Onset  . Heart disease Brother   . Throat cancer Brother   . Other Mother     UNSURE  . Other Father     UNSURE  . Emphysema Sister   . Emphysema Sister   .  Other Brother     UNSURE  . Diabetes       Review of Systems  Constitutional: Negative for chills, fever and malaise/fatigue.  HENT: Negative.   Eyes: Negative.   Respiratory: Negative for cough, hemoptysis and shortness of breath.   Cardiovascular: Negative for palpitations and PND.       +left sided rib pain   Gastrointestinal: Negative for abdominal pain, nausea and vomiting.  Genitourinary: Negative for dysuria, flank pain and hematuria.  Musculoskeletal: Negative for joint pain and neck pain.  Skin: Negative for rash.  Neurological: Negative for dizziness, sensory change, focal weakness and headaches.  All other systems reviewed and are negative.  Vitals:   02/04/16 0914  BP: 128/80  Pulse: 77  Resp: 18  Temp: 97.6 F (36.4 C)     Physical Exam  Constitutional: He is oriented to person, place, and time. He appears well-developed and well-nourished.  HENT:  Head: Normocephalic and atraumatic.  Eyes: Conjunctivae and EOM are normal. Pupils are equal, round, and reactive to light.  Neck: Normal range of motion. Neck supple.  Cardiovascular: Normal rate, regular rhythm and normal heart sounds.   Pulmonary/Chest: Effort normal and breath sounds normal. No respiratory distress. He exhibits tenderness (left lower posterior).  Abdominal: Soft. Bowel sounds are normal. He exhibits no distension. There is no tenderness.  Musculoskeletal: Normal range of motion.  Neurological:  He is alert and oriented to person, place, and time. No sensory deficit. He exhibits normal muscle tone.  Skin: Skin is warm and dry. Capillary refill takes less than 2 seconds.  Psychiatric: He has a normal mood and affect. His behavior is normal.  Vitals reviewed.  CXR reviewed: NAD, no PTX, +single rib Fx  ASSESSMENT & PLAN: Tal was seen today for follow-up.  Diagnoses and all orders for this visit:  Closed fracture of one rib of left side with routine healing, subsequent encounter  Rib pain on left side -     DG Chest 2 View; Future    Patient Instructions       IF you received an x-ray today, you will receive an invoice from [facility]. Please contact [facility] at [phone] with questions or concerns regarding your invoice.   IF you received labwork today, you will receive an invoice from [facility]. Please contact [facility] at [phone] with questions or concerns regarding your invoice.   Our billing staff will not be able to assist you with questions regarding bills from these companies.  You will be contacted with the lab results as soon as they are available. The fastest way to get your results is to activate your My Chart account. Instructions are located on the last page of this paperwork. If you have not heard from [place] regarding the results in 2 weeks, please contact this office.      Rib Fracture A rib fracture is a break or crack in one of the bones of the ribs. The ribs are like a cage that goes around your upper chest. A broken or cracked rib is often painful, but most do not cause other problems. Most rib fractures heal on their own in 1-3 months. Follow these instructions at home:  Avoid activities that cause pain to the injured area. Protect your injured area.  Slowly increase activity as told by your doctor.  Take medicine as told by your doctor.  Put ice on the injured area for the first 1-2 days after you have been treated  or as told  by your doctor.  Put ice in a plastic bag.  Place a towel between your skin and the bag.  Leave the ice on for 15-20 minutes at a time, every 2 hours while you are awake.  Do deep breathing as told by your doctor. You may be told to:  Take deep breaths many times a day.  Cough many times a day while hugging a pillow.  Use a device (incentive spirometer) to perform deep breathing many times a day.  Drink enough fluids to keep your pee (urine) clear or pale yellow.  Do not wear a rib belt or binder. These do not allow you to breathe deeply. Get help right away if:  You have a fever.  You have trouble breathing.  You cannot stop coughing.  You cough up thick or bloody spit (mucus).  You feel sick to your stomach (nauseous), throw up (vomit), or have belly (abdominal) pain.  Your pain gets worse and medicine does not help. This information is not intended to replace advice given to you by your health care provider. Make sure you discuss any questions you have with your health care provider. Document Released: 10/04/2007 Document Revised: 06/02/2015 Document Reviewed: 02/27/2012 Elsevier Interactive Patient Education  2017 Elsevier Inc.      Agustina Caroli, MD [facility]

## 2016-02-09 DIAGNOSIS — K648 Other hemorrhoids: Secondary | ICD-10-CM | POA: Diagnosis not present

## 2016-02-22 ENCOUNTER — Ambulatory Visit (INDEPENDENT_AMBULATORY_CARE_PROVIDER_SITE_OTHER): Payer: Medicare Other | Admitting: Emergency Medicine

## 2016-02-22 ENCOUNTER — Ambulatory Visit (INDEPENDENT_AMBULATORY_CARE_PROVIDER_SITE_OTHER): Payer: Medicare Other

## 2016-02-22 VITALS — BP 138/86 | HR 70 | Temp 97.8°F | Resp 16 | Ht 74.0 in | Wt 177.8 lb

## 2016-02-22 DIAGNOSIS — S2232XD Fracture of one rib, left side, subsequent encounter for fracture with routine healing: Secondary | ICD-10-CM

## 2016-02-22 DIAGNOSIS — R918 Other nonspecific abnormal finding of lung field: Secondary | ICD-10-CM | POA: Diagnosis not present

## 2016-02-22 NOTE — Progress Notes (Signed)
Chad [name] 83 y.o.   Chief Complaint  Patient presents with  . Follow-up    RIB INJURY    HISTORY OF PRESENT ILLNESS: This is a 83 y.o. male here for recheck of left sided rib Fx. No complaints and healing well.  HPI   Prior to Admission medications   Medication Sig Start Date End Date Taking? Authorizing Provider  cholecalciferol (VITAMIN D) 1000 units tablet Take 3,000 Units by mouth daily.   Yes Historical Provider, MD  clidinium-chlordiazePOXIDE (LIBRAX) 5-2.5 MG capsule Take 1 capsule by mouth 2 (two) times daily. 01/24/16  Yes Golden Circle, FNP  cyanocobalamin 1000 MCG tablet Take 1,000 mcg by mouth daily.   Yes Historical Provider, MD  diclofenac sodium (VOLTAREN) 1 % GEL Apply 4 g topically 4 (four) times daily. 01/29/16  Yes Blanchie Dessert, MD  diltiazem (CARDIZEM) 30 MG tablet Take 1 tablet (30 mg total) by mouth every 6 (six) hours as needed. For palpitations. 10/20/15  Yes Lelon Perla, MD  fluticasone (FLONASE) 50 MCG/ACT nasal spray Place 2 sprays into both nostrils daily. 05/31/15  Yes Wardell Honour, MD  gabapentin (NEURONTIN) 300 MG capsule Take 3 capsules at bedtime. 05/31/15  Yes Wardell Honour, MD  HYDROcodone-acetaminophen (NORCO/VICODIN) 5-325 MG tablet Take 0.5-1 tablets by mouth every 4 (four) hours as needed. 01/29/16  Yes Blanchie Dessert, MD  lisinopril (PRINIVIL,ZESTRIL) 10 MG tablet Take 1 tablet (10 mg total) by mouth daily. 05/31/15  Yes Wardell Honour, MD  loperamide (IMODIUM) 2 MG capsule Take 1 capsule (2 mg total) by mouth 4 (four) times daily as needed for diarrhea or loose stools. 11/22/15  Yes Nicole Pisciotta, PA-C  multivitamin-lutein (OCUVITE-LUTEIN) CAPS capsule Take 1 capsule by mouth daily.   Yes Historical Provider, MD  omeprazole (PRILOSEC) 20 MG capsule Take 1 capsule (20 mg total) by mouth daily. Take one hour before breakfast 05/31/15  Yes Wardell Honour, MD    Allergies  Allergen Reactions  . Dyazide [Hydrochlorothiazide  W-Triamterene] Other (See Comments)    Caused hyponatremia 04/2012    Patient Active Problem List   Diagnosis Date Noted  . 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 Anal lesion 06/07/2015  . Gas 01/18/2015  . Aortic stenosis 06/08/2013  . Unsteadiness 02/11/2013  . Ectopic atrial tachycardia ([place]) 01/12/2013  . Atrial tachycardia ([place]) 01/12/2013  . Atrial flutter ([place]) 05/06/2012  . Aortic valve sclerosis 05/06/2012  . Acute renal failure ([place]) 05/05/2012  . 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 Anemia 07/27/2010  . Essential hypertension 11/15/2008  . COPD 11/15/2008  . GERD 11/15/2008  . IBS 11/15/2008    Past Medical History:  Diagnosis Date  . Abnormal EKG    ST changes with normal coronaries by cath 05/06/12.  . Arthritis    "hands" (01/12/2013); feet (podiatry consultation)  . Atrial flutter ([place])    a. Slow ~100bpm when in 2:1; dx 04/2012. b. Placed on Xarelto.  c. s/p ablation 07-08-2012 by Dr Rayann Heman  . Atrial tachycardia ([place])   . Carotid artery calcification    By CXR - dopplers 05/06/12 without obvious evidence of significant ICA stenosis >40%  . Colon polyps 06/26/2005  . COPD (chronic obstructive pulmonary disease) ([place])   . Diverticulosis of colon (without mention of hemorrhage)   . Dysrhythmia   . Gastritis, chronic    Pt denies bleeding 04/2012. EGD 2012 reportedly normal.  . GERD (gastroesophageal reflux disease)   .  Hypertension   . Hyponatremia    04/2012 r/t diuretic.  . IBS (irritable bowel syndrome)   . Internal and external hemorrhoids without complication   . Macular degeneration of left eye   . Oral cancer ([place]) 02/1997  . Pneumonia    "couple times" (01/12/2013)  . Shortness of breath dyspnea    walking distances    Past Surgical History:  Procedure Laterality Date  . ATRIAL FLUTTER ABLATION  07-08-2012   of typical atrial flutter by Dr Rayann Heman  . ATRIAL FLUTTER ABLATION N/A  07/08/2012   Procedure: ATRIAL FLUTTER ABLATION;  Surgeon: Thompson Grayer, MD;  Location: [facility] CATH LAB;  Service: Cardiovascular;  Laterality: N/A;  . CARDIAC CATHETERIZATION  04/2012  . COLONOSCOPY  08/29/10   diverticulosis, internal hemorrhoids  . ESOPHAGOGASTRODUODENOSCOPY  09/20/10   normal  . EYE SURGERY  01/08/2014   Cataract resection L.    [name] [place]  . LEFT HEART CATHETERIZATION WITH CORONARY ANGIOGRAM N/A 05/06/2012   Procedure: LEFT HEART CATHETERIZATION WITH CORONARY ANGIOGRAM;  Surgeon: Peter M Martinique, MD;  Location: [facility] CATH LAB;  Service: Cardiovascular;  Laterality: N/A;  . 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 TRIGGER FINGER RELEASE Right 1970's   "2 fingers"  . WART FULGURATION Left 09/15/2015   Procedure: EXCISIONal biospy of left peri anual and anual canal mass;  Surgeon: Greer Pickerel, MD;  Location: [facility] ORS;  Service: General;  Laterality: Left;    Social History   Social History  . Marital status: Divorced    Spouse name: N/A  . Number of children: 2  . Years of education: 16   Occupational History  . Retired    Social History Main Topics  . Smoking status: Former Smoker    Packs/day: 0.50    Years: 20.00    Types: Cigarettes, Pipe, Cigars    Quit date: 01/08/1966  . Smokeless tobacco: Never Used     Comment:    . Alcohol use 0.0 oz/week     Comment: 01/12/2013 "did drink 2-3 12 oz beers a day; stopped drinking in 04/2012"  . Drug use: No  . Sexual activity: No   Other Topics Concern  . Not on file   Social History Narrative   Marital status: divorced; lives with ex-wife; not dating in 2017.      Children:  2 sons, 2 grandsons, 1 granddaughter; no gg.      Lives: with ex-wife in house.      Employment: retired first time age 21; retired in 1997.  Barista x 25 years; Actor.      Tobacco: quit smoking 1997; smoked for 27 years.      Alcohol: quit 2004      Exercise:  Walking daily short distances.        ADLs:  NO assistant devices; has cane if needs it; drives; pays bill; grocery shopping by wife; wife cleans house and does laundry.     Advanced Directives:  +LIVING WILL; +FULL CODE.  HCPOA: oldest son Masaru Haler Sr.)       Family History  Problem Relation Age of Onset  . Heart disease Brother   . Throat cancer Brother   . Other Mother     UNSURE  . Other Father     UNSURE  . Emphysema Sister   . Emphysema Sister   . Other Brother     UNSURE  . Diabetes  Review of Systems  Constitutional: Negative.   HENT: Negative.   Eyes: Negative.   Respiratory: Negative.   Cardiovascular: Negative.   Gastrointestinal: Negative.   Genitourinary: Negative.   Skin: Negative.   Neurological: Negative.   All other systems reviewed and are negative.  Vitals:   02/22/16 1400  BP: 138/86  Pulse: 70  Resp: 16  Temp: 97.8 F (36.6 C)     Physical Exam  Constitutional: He is oriented to person, place, and time. He appears well-developed and well-nourished.  HENT:  Head: Normocephalic and atraumatic.  Eyes: Conjunctivae and EOM are normal. Pupils are equal, round, and reactive to light.  Neck: Normal range of motion. Neck supple.  Cardiovascular: Normal rate and regular rhythm.   Pulmonary/Chest: Effort normal and breath sounds normal. He exhibits no tenderness.  Abdominal: Soft. There is no tenderness.  Musculoskeletal: Normal range of motion.  Neurological: He is alert and oriented to person, place, and time.  Skin: Skin is warm and dry. Capillary refill takes less than 2 seconds.  Vitals reviewed.  CXR reviewed: NAD  ASSESSMENT & PLAN: Tatiana was seen today for follow-up.  Diagnoses and all orders for this visit:  Closed fracture of one rib of left side with routine healing, subsequent encounter -     DG Chest 2 View; Future      Agustina Caroli, MD [facility]

## 2016-02-22 NOTE — Patient Instructions (Addendum)
  Continue present care. Healing well without complications.   IF you received an x-ray today, you will receive an invoice from [facility] Radiology. Please contact [facility] Radiology at [phone] with questions or concerns regarding your invoice.   IF you received labwork today, you will receive an invoice from [place]. Please contact [facility] at [phone] with questions or concerns regarding your invoice.   Our billing staff will not be able to assist you with questions regarding bills from these companies.  You will be contacted with the lab results as soon as they are available. The fastest way to get your results is to activate your My Chart account. Instructions are located on the last page of this paperwork. If you have not heard from [place] regarding the results in 2 weeks, please contact this office.

## 2016-03-02 ENCOUNTER — Emergency Department (HOSPITAL_COMMUNITY): Payer: Medicare Other

## 2016-03-02 ENCOUNTER — Observation Stay (HOSPITAL_COMMUNITY)
Admission: EM | Admit: 2016-03-02 | Discharge: 2016-03-03 | Disposition: A | Discharge status: Home / Self Care | Payer: Medicare Other | Attending: Family Medicine | Admitting: Family Medicine

## 2016-03-02 ENCOUNTER — Encounter (HOSPITAL_COMMUNITY): Payer: Self-pay

## 2016-03-02 DIAGNOSIS — K219 Gastro-esophageal reflux disease without esophagitis: Secondary | ICD-10-CM | POA: Insufficient documentation

## 2016-03-02 DIAGNOSIS — Z7951 Long term (current) use of inhaled steroids: Secondary | ICD-10-CM | POA: Insufficient documentation

## 2016-03-02 DIAGNOSIS — I350 Nonrheumatic aortic (valve) stenosis: Secondary | ICD-10-CM | POA: Insufficient documentation

## 2016-03-02 DIAGNOSIS — Z87891 Personal history of nicotine dependence: Secondary | ICD-10-CM | POA: Insufficient documentation

## 2016-03-02 DIAGNOSIS — J449 Chronic obstructive pulmonary disease, unspecified: Secondary | ICD-10-CM | POA: Insufficient documentation

## 2016-03-02 DIAGNOSIS — N179 Acute kidney failure, unspecified: Secondary | ICD-10-CM | POA: Insufficient documentation

## 2016-03-02 DIAGNOSIS — S2242XA Multiple fractures of ribs, left side, initial encounter for closed fracture: Secondary | ICD-10-CM | POA: Diagnosis not present

## 2016-03-02 DIAGNOSIS — W000XXA Fall on same level due to ice and snow, initial encounter: Secondary | ICD-10-CM | POA: Diagnosis not present

## 2016-03-02 DIAGNOSIS — Z79899 Other long term (current) drug therapy: Secondary | ICD-10-CM | POA: Diagnosis not present

## 2016-03-02 DIAGNOSIS — I2510 Atherosclerotic heart disease of native coronary artery without angina pectoris: Secondary | ICD-10-CM | POA: Diagnosis not present

## 2016-03-02 DIAGNOSIS — I10 Essential (primary) hypertension: Secondary | ICD-10-CM | POA: Insufficient documentation

## 2016-03-02 DIAGNOSIS — J189 Pneumonia, unspecified organism: Secondary | ICD-10-CM | POA: Diagnosis not present

## 2016-03-02 DIAGNOSIS — R0602 Shortness of breath: Secondary | ICD-10-CM | POA: Diagnosis not present

## 2016-03-02 DIAGNOSIS — E871 Hypo-osmolality and hyponatremia: Secondary | ICD-10-CM | POA: Insufficient documentation

## 2016-03-02 DIAGNOSIS — R042 Hemoptysis: Secondary | ICD-10-CM | POA: Diagnosis present

## 2016-03-02 LAB — COMPREHENSIVE METABOLIC PANEL
ALT: 26 U/L (ref 17–63)
AST: 45 U/L — ABNORMAL HIGH (ref 15–41)
Albumin: 3 g/dL — ABNORMAL LOW (ref 3.5–5.0)
Alkaline Phosphatase: 60 U/L (ref 38–126)
Anion gap: 9 (ref 5–15)
BUN: 21 mg/dL — ABNORMAL HIGH (ref 6–20)
CHLORIDE: 98 mmol/L — AB (ref 101–111)
CO2: 24 mmol/L (ref 22–32)
Calcium: 8.7 mg/dL — ABNORMAL LOW (ref 8.9–10.3)
Creatinine, Ser: 1.45 mg/dL — ABNORMAL HIGH (ref 0.61–1.24)
GFR, EST AFRICAN AMERICAN: 50 mL/min — AB (ref >60)
GFR, EST NON AFRICAN AMERICAN: 43 mL/min — AB (ref >60)
Glucose, Bld: 109 mg/dL — ABNORMAL HIGH (ref 65–99)
Potassium: 4.5 mmol/L (ref 3.5–5.1)
SODIUM: 131 mmol/L — AB (ref 135–145)
Total Bilirubin: 0.5 mg/dL (ref 0.3–1.2)
Total Protein: 6.3 g/dL — ABNORMAL LOW (ref 6.5–8.1)

## 2016-03-02 LAB — CBC WITH DIFFERENTIAL/PLATELET
BASOS ABS: 0 10*3/uL (ref 0.0–0.1)
Basophils Relative: 0 %
EOS ABS: 0.2 10*3/uL (ref 0.0–0.7)
EOS PCT: 2 %
HCT: 35.1 % — ABNORMAL LOW (ref 39.0–52.0)
Hemoglobin: 11.7 g/dL — ABNORMAL LOW (ref 13.0–17.0)
LYMPHS ABS: 1.1 10*3/uL (ref 0.7–4.0)
LYMPHS PCT: 12 %
MCH: 27.9 pg (ref 26.0–34.0)
MCHC: 33.3 g/dL (ref 30.0–36.0)
MCV: 83.8 fL (ref 78.0–100.0)
Monocytes Absolute: 0.8 10*3/uL (ref 0.1–1.0)
Monocytes Relative: 9 %
Neutro Abs: 6.9 10*3/uL (ref 1.7–7.7)
Neutrophils Relative %: 77 %
PLATELETS: 261 10*3/uL (ref 150–400)
RBC: 4.19 MIL/uL — ABNORMAL LOW (ref 4.22–5.81)
RDW: 13.7 % (ref 11.5–15.5)
WBC: 9 10*3/uL (ref 4.0–10.5)

## 2016-03-02 LAB — ABO/RH: ABO/RH(D): O NEG

## 2016-03-02 LAB — TYPE AND SCREEN
ABO/RH(D): O NEG
ANTIBODY SCREEN: NEGATIVE

## 2016-03-02 LAB — I-STAT CG4 LACTIC ACID, ED: LACTIC ACID, VENOUS: 1.23 mmol/L (ref 0.5–1.9)

## 2016-03-02 MED ORDER — GABAPENTIN 300 MG PO CAPS
900.0000 mg | ORAL_CAPSULE | Freq: Every day | ORAL | Status: DC
  Administered 2016-03-02: 900 mg via ORAL
  Filled 2016-03-02: qty 3

## 2016-03-02 MED ORDER — POLYETHYLENE GLYCOL 3350 17 G PO PACK: 17.0000 g | PACK | Freq: Every day | ORAL | Status: DC | PRN

## 2016-03-02 MED ORDER — HYDROCODONE-ACETAMINOPHEN 5-325 MG PO TABS: 1.0000 | ORAL_TABLET | Freq: Once | ORAL | Status: DC

## 2016-03-02 MED ORDER — DEXTROSE 5 % IV SOLN
500.0000 mg | Freq: Once | INTRAVENOUS | Status: AC
  Administered 2016-03-02: 500 mg via INTRAVENOUS
  Filled 2016-03-02: qty 500

## 2016-03-02 MED ORDER — SODIUM CHLORIDE 0.9% FLUSH: 3.0000 mL | INTRAVENOUS | Status: DC | PRN

## 2016-03-02 MED ORDER — AZITHROMYCIN 500 MG PO TABS: 250.0000 mg | ORAL_TABLET | Freq: Every day | ORAL | Status: DC

## 2016-03-02 MED ORDER — SODIUM CHLORIDE 0.9% FLUSH: 3.0000 mL | Freq: Two times a day (BID) | INTRAVENOUS | Status: DC

## 2016-03-02 MED ORDER — IPRATROPIUM-ALBUTEROL 0.5-2.5 (3) MG/3ML IN SOLN
3.0000 mL | Freq: Once | RESPIRATORY_TRACT | Status: AC
  Administered 2016-03-02: 3 mL via RESPIRATORY_TRACT
  Filled 2016-03-02: qty 3

## 2016-03-02 MED ORDER — SODIUM CHLORIDE 0.9 % IV SOLN: 250.0000 mL | INTRAVENOUS | Status: DC | PRN

## 2016-03-02 MED ORDER — HYDRALAZINE HCL 20 MG/ML IJ SOLN: 10.0000 mg | INTRAMUSCULAR | Status: DC | PRN

## 2016-03-02 MED ORDER — ACETAMINOPHEN 650 MG RE SUPP: 650.0000 mg | Freq: Four times a day (QID) | RECTAL | Status: DC | PRN

## 2016-03-02 MED ORDER — HEPARIN SODIUM (PORCINE) 5000 UNIT/ML IJ SOLN
5000.0000 [IU] | Freq: Three times a day (TID) | INTRAMUSCULAR | Status: DC
  Administered 2016-03-02 – 2016-03-03 (×2): 5000 [IU] via SUBCUTANEOUS
  Filled 2016-03-02 (×2): qty 1

## 2016-03-02 MED ORDER — IOPAMIDOL (ISOVUE-370) INJECTION 76%
INTRAVENOUS | Status: AC
  Administered 2016-03-02: 100 mL
  Filled 2016-03-02: qty 100

## 2016-03-02 MED ORDER — DEXTROSE 5 % IV SOLN
1.0000 g | Freq: Once | INTRAVENOUS | Status: AC
  Administered 2016-03-02: 1 g via INTRAVENOUS
  Filled 2016-03-02: qty 10

## 2016-03-02 MED ORDER — DOCUSATE SODIUM 100 MG PO CAPS: 100.0000 mg | ORAL_CAPSULE | Freq: Every day | ORAL | Status: DC | PRN

## 2016-03-02 MED ORDER — SODIUM CHLORIDE 0.9% FLUSH
3.0000 mL | Freq: Two times a day (BID) | INTRAVENOUS | Status: DC
  Administered 2016-03-02: 3 mL via INTRAVENOUS

## 2016-03-02 MED ORDER — ACETAMINOPHEN 325 MG PO TABS: 650.0000 mg | ORAL_TABLET | Freq: Four times a day (QID) | ORAL | Status: DC | PRN

## 2016-03-02 MED ORDER — SODIUM CHLORIDE 0.9 % IV BOLUS (SEPSIS)
1000.0000 mL | Freq: Once | INTRAVENOUS | Status: AC
  Administered 2016-03-02: 1000 mL via INTRAVENOUS

## 2016-03-02 MED ORDER — SODIUM CHLORIDE 0.9 % IV SOLN
INTRAVENOUS | Status: DC
  Administered 2016-03-02: 19:00:00 via INTRAVENOUS

## 2016-03-02 MED ORDER — LISINOPRIL 10 MG PO TABS: 10.0000 mg | ORAL_TABLET | Freq: Every day | ORAL | Status: DC

## 2016-03-02 MED ORDER — AZITHROMYCIN 500 MG PO TABS
250.0000 mg | ORAL_TABLET | Freq: Every day | ORAL | Status: DC
  Administered 2016-03-03: 250 mg via ORAL
  Filled 2016-03-02: qty 1

## 2016-03-02 MED ORDER — HYDRALAZINE HCL 20 MG/ML IJ SOLN
5.0000 mg | INTRAMUSCULAR | Status: DC | PRN
  Administered 2016-03-02: 5 mg via INTRAVENOUS
  Filled 2016-03-02: qty 1

## 2016-03-02 NOTE — Progress Notes (Signed)
Pt wanted to know why he was not getting his BP med. MD was notified, and advised that BP med was pended to re-evaluate pt's kidney function in the morning. Pt was advised. Will continue to monitor.

## 2016-03-02 NOTE — ED Triage Notes (Addendum)
Per Pt, Pt is coming from home with complaints of a cough that's started yesterday. Pt reports coughing up bloody sputum. Denies congestion or other symptoms. Intermittent SOB and reports left rib pain from previous cracked rib

## 2016-03-02 NOTE — H&P (Signed)
[facility] Admission History and Physical Service Pager: [phone]  Patient name: Chad [name] Medical record number: [id number] Date of birth: [birth year] Age: 83 y.o. Gender: male  Primary Care Provider: Reginia Forts, MD Consultants: none Code Status: Full (confirmed on admission)  Chief Complaint: cough  Assessment and Plan: ABDULRAHEEM SHAVE is a 83 y.o. male presenting with cough and multifocal pneumonia. PMH is significant for HTN, COPD, GERD, hx of throat cancer s/p resection (1999), atrial flutter, aortic stenosis and anemia.  CAP- recently slipped on ice and broke several ribs on left side. Has been coughing since with recent bloody sputum production. Tachycardic upon presentation to ED that resolved with fluids. Currently not requiring any oxygen, Lactic acid normal, WBC normal at 9.0, afebrile, CXR with hazy opacities at the bases could be pneumonia, atelectasis, or alveolar hemorrhage. CTA negative for PE, evidence of multifocal pneumonia right middle lobe and left lower lobe. Small bilateral pleural effusions. Acute left lateral 6-8th rib fractures. S/p 1 dose CTX in ED and 1 dose azithromycin.  -place in observation attending Dr. Erin Hearing -monitor on telemetry -vitals per floor -up with assistance -continue PO azithromycin for 5 days total tx for CAP -PT/OT evaluation  Recent left sided rib fracture- patient slipped on ice 2 weeks ago and fractured 3 ribs on left side. Has been doing well, denies pain currently, only takes tylenol for pain at home -continue tylenol for pain  AKI- creatinine 1.45 on admission, baseline appears to be 1.1-1.2 -continue NS at 75/hr -am BMP  Hyponatremia- sodium 131 on admission, per chart review has been normal in past -continue NS, recheck in AM  HTN- taking lisinopril 10mg  every night at home, BP normo to high in ED. -continue lisinopril 10 mg at night  GERD- patient currently doesn't take any medication  for this but has PPI on med list -monitor, can restart PPI if needed  COPD- former smoker, noted on chest imaging, patient does not take any medications for this -monitor respiratory status -can add breathing treatments if needed  Hx of A. Flutter s/p ablation and atrial tachycardia- currently in NSR. HR in 80's. Patient has cardizem TID as needed for palpitations at home but has not needed to take this in over a month -monitor on tele -follow up with cardiology as outpatient  FEN/GI: heart healthy diet, saline lock IV Prophylaxis: heparin  Disposition: pending clinical improvement  History of Present Illness:  Chad [name] is a 83 y.o. male presenting with cough for past several days.  Slipped and fell on ice two weeks ago and broke some ribs. Has been coughing more since then. Over past 2-3 days has been coughing up enough to spit out sputum that he noticed had blood in it. Reports sputum is dark red colored. Denies fevers, chills. He denies shortness of breath and wheezing. He lives with ex wife and brother-in-law. No smoking, used to smoke age 70 to 61, less than pack a day. Used to drink alcohol heavily until 1998 (when he was diagnosed with cancer in his mouth). No drinks in 2-3 years. No drug use. Is very independent at baseline and overall feels well but he was concerned about the blood in his sputum. He would prefer to stay on safe side and be watched overnight while he is treated for pneumonia.  Review Of Systems: Per HPI with the following additions:   Review of Systems  Constitutional: Positive for chills. Negative for fever.  HENT: Negative for hearing loss and  sore throat.   Eyes: Negative for blurred vision.  Respiratory: Positive for cough, hemoptysis and sputum production. Negative for shortness of breath.   Cardiovascular: Negative for chest pain and palpitations.  Gastrointestinal: Negative for abdominal pain, constipation, diarrhea, nausea and vomiting.   Genitourinary: Negative for dysuria.  Musculoskeletal: Negative for falls.  Neurological: Negative for weakness and headaches.  Psychiatric/Behavioral: Negative for substance abuse.    Patient Active Problem List   Diagnosis Date Noted  . Pneumonia 03/02/2016  . 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 Anal lesion 06/07/2015  . Gas 01/18/2015  . Aortic stenosis 06/08/2013  . Unsteadiness 02/11/2013  . Ectopic atrial tachycardia ([place]) 01/12/2013  . Atrial tachycardia ([place]) 01/12/2013  . Atrial flutter ([place]) 05/06/2012  . Aortic valve sclerosis 05/06/2012  . Acute renal failure ([place]) 05/05/2012  . 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 Localized cancer of throat ([place]) 02/04/2012  . Anemia 07/27/2010  . Essential hypertension 11/15/2008  . COPD 11/15/2008  . GERD 11/15/2008  . IBS 11/15/2008    Past Medical History: Past Medical History:  Diagnosis Date  . Abnormal EKG    ST changes with normal coronaries by cath 05/06/12.  . Arthritis    "hands" (01/12/2013); feet (podiatry consultation)  . Atrial flutter ([place])    a. Slow ~100bpm when in 2:1; dx 04/2012. b. Placed on Xarelto.  c. s/p ablation 07-08-2012 by Dr Rayann Heman  . Atrial tachycardia ([place])   . Carotid artery calcification    By CXR - dopplers 05/06/12 without obvious evidence of significant ICA stenosis >40%  . Colon polyps 06/26/2005  . COPD (chronic obstructive pulmonary disease) ([place])   . Diverticulosis of colon (without mention of hemorrhage)   . Dysrhythmia   . Gastritis, chronic    Pt denies bleeding 04/2012. EGD 2012 reportedly normal.  . GERD (gastroesophageal reflux disease)   . Hypertension   . Hyponatremia    04/2012 r/t diuretic.  . IBS (irritable bowel syndrome)   . Internal and external hemorrhoids without complication   . Macular degeneration of left eye   . 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 Pneumonia    "couple times" (01/12/2013)  . Shortness of breath dyspnea    walking distances     Past Surgical History: Past Surgical History:  Procedure Laterality Date  . ATRIAL FLUTTER ABLATION  07-08-2012   of typical atrial flutter by Dr Rayann Heman  . ATRIAL FLUTTER ABLATION N/A 07/08/2012   Procedure: ATRIAL FLUTTER ABLATION;  Surgeon: Thompson Grayer, MD;  Location: [facility] CATH LAB;  Service: Cardiovascular;  Laterality: N/A;  . CARDIAC CATHETERIZATION  04/2012  . COLONOSCOPY  08/29/10   diverticulosis, internal hemorrhoids  . ESOPHAGOGASTRODUODENOSCOPY  09/20/10   normal  . EYE SURGERY  01/08/2014   Cataract resection L.    [name] [place]  . LEFT HEART CATHETERIZATION WITH CORONARY ANGIOGRAM N/A 05/06/2012   Procedure: LEFT HEART CATHETERIZATION WITH CORONARY ANGIOGRAM;  Surgeon: Peter M Martinique, MD;  Location: [facility] CATH LAB;  Service: Cardiovascular;  Laterality: N/A;  . 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 TRIGGER FINGER RELEASE Right 1970's   "2 fingers"  . WART FULGURATION Left 09/15/2015   Procedure: EXCISIONal biospy of left peri anual and anual canal mass;  Surgeon: Greer Pickerel, MD;  Location: [facility] ORS;  Service: General;  Laterality: Left;    Social History: Social History  Substance Use Topics  . Smoking status: Former Smoker    Packs/day: 0.50  Years: 20.00    Types: Cigarettes, Pipe, Cigars    Quit date: 01/08/1966  . Smokeless tobacco: Never Used     Comment:    . Alcohol use 0.0 oz/week     Comment: 01/12/2013 "did drink 2-3 12 oz beers a day; stopped drinking in 04/2012"   Additional social history: lives with ex-wife and brother in law Please also refer to relevant sections of EMR.  Family History: Family History  Problem Relation Age of Onset  . Heart disease Brother   . Throat cancer Brother   . Other Mother     UNSURE  . Other Father     UNSURE  . Emphysema Sister   . Emphysema Sister   . Other Brother     UNSURE  . Diabetes      Allergies and Medications: Allergies  Allergen Reactions  . Dyazide [Hydrochlorothiazide  W-Triamterene] Other (See Comments)    Caused hyponatremia 04/2012   No current facility-administered medications on file prior to encounter.    Current Outpatient Prescriptions on File Prior to Encounter  Medication Sig Dispense Refill  . cholecalciferol (VITAMIN D) 1000 units tablet Take 2,000 Units by mouth daily.     . clidinium-chlordiazePOXIDE (LIBRAX) 5-2.5 MG capsule Take 1 capsule by mouth 2 (two) times daily. (Patient taking differently: Take 1 capsule by mouth 3 (three) times daily before meals. ) 60 capsule 0  . cyanocobalamin 1000 MCG tablet Take 1,000 mcg by mouth daily.    [name] diltiazem (CARDIZEM) 30 MG tablet Take 1 tablet (30 mg total) by mouth every 6 (six) hours as needed. For palpitations. 30 tablet 11  . fluticasone (FLONASE) 50 MCG/ACT nasal spray Place 2 sprays into both nostrils daily. 48 g 3  . gabapentin (NEURONTIN) 300 MG capsule Take 3 capsules at bedtime. 270 capsule 3  . lisinopril (PRINIVIL,ZESTRIL) 10 MG tablet Take 1 tablet (10 mg total) by mouth daily. 90 tablet 3  . multivitamin-lutein (OCUVITE-LUTEIN) CAPS capsule Take 1 capsule by mouth daily.    [name] omeprazole (PRILOSEC) 20 MG capsule Take 1 capsule (20 mg total) by mouth daily. Take one hour before breakfast 90 capsule 3    Objective: BP 153/77   Pulse 89   Temp 97.9 F (36.6 C) (Oral)   Resp 18   SpO2 97%  Exam: General: elderly man laying in bed in NAD Eyes: PERRLA, EOMI. Sclera white ENTM: moist mucous membranes Neck: supple, non-tender. No lymphadenopathy Cardiovascular: Regular rate and rhythm. 3/6 holosystolic murmur.  Respiratory: no increased work of breathing, bilateral lung bases with crackles  Gastrointestinal: soft, non-tender, non-distended, +BS MSK: no edema or cyanosis Derm: warm, dry, no rashes or lesions noted Neuro: CN2-12 in tact. 5/5 strength bilaterally in upper and lower extremities. Sensation in tact throughout Psych: appropriate mood and affect  Labs and Imaging: CBC  BMET   Recent Labs Lab 03/02/16 1222  WBC 9.0  HGB 11.7*  HCT 35.1*  PLT 261    Recent Labs Lab 03/02/16 1222  NA 131*  K 4.5  CL 98*  CO2 24  BUN 21*  CREATININE 1.45*  GLUCOSE 109*  CALCIUM 8.7*     Dg Chest 2 View  Result Date: 03/02/2016 CLINICAL DATA:  Hemoptysis for 1 day EXAM: CHEST  2 VIEW COMPARISON:  02/22/2016 FINDINGS: Indistinct opacities at the bases, best seen laterally. No Kerley lines, effusion, or pneumothorax. Chronic hyperinflation in this patient with history of COPD. Remote lateral left eighth rib fracture. Stable biapical pleural thickening. Extensive  arterial calcification. IMPRESSION: 1. Hazy opacities at the bases could be pneumonia, atelectasis, or alveolar hemorrhage. 2. COPD. Electronically Signed   By: Monte Fantasia M.D.   On: 03/02/2016 11:55   Ct Angio Chest Pe W Or Wo Contrast  Result Date: 03/02/2016 CLINICAL DATA:  83 year old male with hemoptysis. Cough since yesterday. Intermittent shortness of breath. Initial encounter. EXAM: CT ANGIOGRAPHY CHEST WITH CONTRAST TECHNIQUE: Multidetector CT imaging of the chest was performed using the standard protocol during bolus administration of intravenous contrast. Multiplanar CT image reconstructions and MIPs were obtained to evaluate the vascular anatomy. CONTRAST:  100 mL Isovue 370 COMPARISON:  Chest radiographs 1148 hours today and earlier. CT Abdomen and Pelvis 11/22/2015. FINDINGS: Cardiovascular: Good contrast bolus timing in the pulmonary arterial tree. No focal filling defect identified in the pulmonary arteries to suggest acute pulmonary embolism. Calcified aortic and coronary artery atherosclerosis. No pericardial effusion. Negative visible aorta otherwise. Mediastinum/Nodes: No mediastinal lymphadenopathy. Mild, reactive appearing bilateral hilar lymph nodes. Lungs/Pleura: Globular 2 cm focus of retained secretions in the trachea at the thoracic inlet (series 5, image 27). Minimal additional  retained secretions along the more distal right lateral wall of the trachea. Other Major airways are patent. Confluent bilateral lower lobe and right middle lobe peribronchial and dependent ground-glass opacity, nearing consolidation in the right lung. Superimposed small bilateral layering pleural effusions. Superimposed right costophrenic angle bronchiectasis versus paraseptal emphysema. The upper lobes are spared. There is occasional upper lobe centrilobular emphysema. Upper Abdomen: Negative visualized liver, gallbladder, spleen, pancreas, adrenal glands, kidneys, and bowel in the upper abdomen. Musculoskeletal: Minimally to mildly displaced lateral left sixth, seventh, and eighth rib fractures are new since November and appear acute. No other No acute osseous abnormality identified. Review of the MIP images confirms the above findings. IMPRESSION: 1.  No evidence of acute pulmonary embolus. 2. Right greater than left lower lobe and right middle lobe pneumonia. Small layering bilateral pleural effusions. Superimposed right costophrenic angle bronchiectasis versus emphysema. 3. Acute appearing left lateral sixth through eighth rib fractures. 4. Two cm globular focus of retained secretions in the trachea at the thoracic inlet. 5. Calcified aortic and coronary artery atherosclerosis. Electronically Signed   By: Genevie Ann M.D.   On: 03/02/2016 14:24     Steve Rattler, DO 03/02/2016, 4:01 PM PGY-1, [facility] pager: [phone], text pages welcome

## 2016-03-02 NOTE — ED Notes (Signed)
[id number] Attempted report

## 2016-03-02 NOTE — ED Provider Notes (Signed)
[facility] DEPT Provider Note   CSN: [id number] Arrival date & time: 03/02/16  1115     History   Chief Complaint Chief Complaint  Patient presents with  . Hemoptysis    HPI Chad [name] is a 83 y.o. male.  HPI   83 yo M with PMHx as below including AFib/flutter on Cardizem/ASA who p/w cough. Pt was just recently seen s/p mechanical fall in which he sustained left-sided rib fx. He has had persistent chest wall pain since then. Over the last 2 days, he has developed progressively worsening cough with production of blood clots and blood tinged/brown sputum. He has had mild shortness of breath. Denies any known fevers. He does feel mildly short of breath but this is not significantly new for him. He also continues to have a sharp, positional left-sided chest pain at the area of his fractures. He has had no recurrent falls.  Past Medical History:  Diagnosis Date  . Abnormal EKG    ST changes with normal coronaries by cath 05/06/12.  . Arthritis    "hands" (01/12/2013); feet (podiatry consultation)  . Atrial flutter ([place])    a. Slow ~100bpm when in 2:1; dx 04/2012. b. Placed on Xarelto.  c. s/p ablation 07-08-2012 by Dr Rayann Heman  . Atrial tachycardia ([place])   . Carotid artery calcification    By CXR - dopplers 05/06/12 without obvious evidence of significant ICA stenosis >40%  . Colon polyps 06/26/2005  . COPD (chronic obstructive pulmonary disease) ([place])   . Diverticulosis of colon (without mention of hemorrhage)   . Dysrhythmia   . Gastritis, chronic    Pt denies bleeding 04/2012. EGD 2012 reportedly normal.  . GERD (gastroesophageal reflux disease)   . Hypertension   . Hyponatremia    04/2012 r/t diuretic.  . IBS (irritable bowel syndrome)   . Internal and external hemorrhoids without complication   . Macular degeneration of left eye   . 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 Pneumonia    "couple times" (01/12/2013)  . Shortness of breath dyspnea    walking distances    Patient  Active Problem List   Diagnosis Date Noted  . Pneumonia of both lungs due to infectious organism 03/02/2016  . 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 Anal lesion 06/07/2015  . Gas 01/18/2015  . Aortic stenosis 06/08/2013  . Unsteadiness 02/11/2013  . Ectopic atrial tachycardia ([place]) 01/12/2013  . Atrial tachycardia ([place]) 01/12/2013  . Atrial flutter ([place]) 05/06/2012  . Aortic valve sclerosis 05/06/2012  . Acute renal failure ([place]) 05/05/2012  . 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 Anemia 07/27/2010  . Essential hypertension 11/15/2008  . COPD 11/15/2008  . GERD 11/15/2008  . IBS 11/15/2008    Past Surgical History:  Procedure Laterality Date  . ATRIAL FLUTTER ABLATION  07-08-2012   of typical atrial flutter by Dr Rayann Heman  . ATRIAL FLUTTER ABLATION N/A 07/08/2012   Procedure: ATRIAL FLUTTER ABLATION;  Surgeon: Thompson Grayer, MD;  Location: [facility] CATH LAB;  Service: Cardiovascular;  Laterality: N/A;  . CARDIAC CATHETERIZATION  04/2012  . COLONOSCOPY  08/29/10   diverticulosis, internal hemorrhoids  . ESOPHAGOGASTRODUODENOSCOPY  09/20/10   normal  . EYE SURGERY  01/08/2014   Cataract resection L.    [name] [place]  . LEFT HEART CATHETERIZATION WITH CORONARY ANGIOGRAM N/A 05/06/2012   Procedure: LEFT HEART CATHETERIZATION WITH CORONARY ANGIOGRAM;  Surgeon: Peter M Martinique, MD;  Location:  [facility] CATH LAB;  Service: Cardiovascular;  Laterality: N/A;  . 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 TRIGGER FINGER RELEASE Right 1970's   "2 fingers"  . WART FULGURATION Left 09/15/2015   Procedure: EXCISIONal biospy of left peri anual and anual canal mass;  Surgeon: Greer Pickerel, MD;  Location: [facility] ORS;  Service: General;  Laterality: Left;       Home Medications    Prior to Admission medications   Medication Sig Start Date End Date Taking? Authorizing Provider  cholecalciferol (VITAMIN D) 1000 units  tablet Take 2,000 Units by mouth daily.    Yes Historical Provider, MD  clidinium-chlordiazePOXIDE (LIBRAX) 5-2.5 MG capsule Take 1 capsule by mouth 2 (two) times daily. Patient taking differently: Take 1 capsule by mouth 3 (three) times daily before meals.  01/24/16  Yes Golden Circle, FNP  cyanocobalamin 1000 MCG tablet Take 1,000 mcg by mouth daily.   Yes Historical Provider, MD  diltiazem (CARDIZEM) 30 MG tablet Take 1 tablet (30 mg total) by mouth every 6 (six) hours as needed. For palpitations. 10/20/15  Yes Lelon Perla, MD  docusate sodium (COLACE) 100 MG capsule Take 100 mg by mouth daily as needed for mild constipation.   Yes Historical Provider, MD  fluticasone (FLONASE) 50 MCG/ACT nasal spray Place 2 sprays into both nostrils daily. 05/31/15  Yes Wardell Honour, MD  gabapentin (NEURONTIN) 300 MG capsule Take 3 capsules at bedtime. 05/31/15  Yes Wardell Honour, MD  lisinopril (PRINIVIL,ZESTRIL) 10 MG tablet Take 1 tablet (10 mg total) by mouth daily. 05/31/15  Yes Wardell Honour, MD  multivitamin-lutein [facility]) CAPS capsule Take 1 capsule by mouth daily.   Yes Historical Provider, MD  omeprazole (PRILOSEC) 20 MG capsule Take 1 capsule (20 mg total) by mouth daily. Take one hour before breakfast 05/31/15  Yes Wardell Honour, MD    Family History Family History  Problem Relation Age of Onset  . Heart disease Brother   . Throat cancer Brother   . Other Mother     UNSURE  . Other Father     UNSURE  . Emphysema Sister   . Emphysema Sister   . Other Brother     UNSURE  . Diabetes      Social History Social History  Substance Use Topics  . Smoking status: Former Smoker    Packs/day: 0.50    Years: 20.00    Types: Cigarettes, Pipe, Cigars    Quit date: 01/08/1966  . Smokeless tobacco: Never Used     Comment:    . Alcohol use 0.0 oz/week     Comment: 01/12/2013 "did drink 2-3 12 oz beers a day; stopped drinking in 04/2012"     Allergies   Dyazide  [hydrochlorothiazide w-triamterene]   Review of Systems Review of Systems  Constitutional: Positive for fatigue. Negative for chills and fever.  HENT: Negative for congestion and rhinorrhea.   Eyes: Negative for visual disturbance.  Respiratory: Positive for cough and shortness of breath. Negative for wheezing.   Cardiovascular: Negative for chest pain and leg swelling.  Gastrointestinal: Negative for abdominal pain, diarrhea, nausea and vomiting.  Genitourinary: Negative for dysuria and flank pain.  Musculoskeletal: Negative for neck pain and neck stiffness.  Skin: Negative for rash and wound.  Allergic/Immunologic: Negative for immunocompromised state.  Neurological: Negative for syncope, weakness and headaches.  All other systems reviewed and are negative.    Physical Exam Updated Vital Signs BP 167/83  Pulse 85   Temp 97.9 F (36.6 C) (Oral)   Resp 16   SpO2 98%   Physical Exam  Constitutional: He is oriented to person, place, and time. He appears well-developed and well-nourished. No distress.  HENT:  Head: Normocephalic and atraumatic.  Eyes: Conjunctivae are normal.  Neck: Neck supple.  Cardiovascular: Normal rate, regular rhythm and normal heart sounds.  Exam reveals no friction rub.   No murmur heard. Pulmonary/Chest: Effort normal. Tachypnea noted. No respiratory distress. He has decreased breath sounds. He has no wheezes. He has rhonchi in the right middle field, the right lower field and the left lower field. He has no rales.  Abdominal: He exhibits no distension.  Musculoskeletal: He exhibits no edema.  Neurological: He is alert and oriented to person, place, and time. He exhibits normal muscle tone.  Skin: Skin is warm. Capillary refill takes less than 2 seconds.  Psychiatric: He has a normal mood and affect.  Nursing note and vitals reviewed.    ED Treatments / Results  Labs (all labs ordered are listed, but only abnormal results are displayed) Labs  Reviewed  CBC WITH DIFFERENTIAL/PLATELET - Abnormal; Notable for the following:       Result Value   RBC 4.19 (*)    Hemoglobin 11.7 (*)    HCT 35.1 (*)    All other components within normal limits  COMPREHENSIVE METABOLIC PANEL - Abnormal; Notable for the following:    Sodium 131 (*)    Chloride 98 (*)    Glucose, Bld 109 (*)    BUN 21 (*)    Creatinine, Ser 1.45 (*)    Calcium 8.7 (*)    Total Protein 6.3 (*)    Albumin 3.0 (*)    AST 45 (*)    GFR calc non Af Amer 43 (*)    GFR calc Af Amer 50 (*)    All other components within normal limits  CULTURE, BLOOD (ROUTINE X 2)  CULTURE, BLOOD (ROUTINE X 2)  BASIC METABOLIC PANEL  CBC  I-STAT CG4 LACTIC ACID, ED  TYPE AND SCREEN  ABO/RH    EKG  EKG Interpretation None       Radiology Dg Chest 2 View  Result Date: 03/02/2016 CLINICAL DATA:  Hemoptysis for 1 day EXAM: CHEST  2 VIEW COMPARISON:  02/22/2016 FINDINGS: Indistinct opacities at the bases, best seen laterally. No Kerley lines, effusion, or pneumothorax. Chronic hyperinflation in this patient with history of COPD. Remote lateral left eighth rib fracture. Stable biapical pleural thickening. Extensive arterial calcification. IMPRESSION: 1. Hazy opacities at the bases could be pneumonia, atelectasis, or alveolar hemorrhage. 2. COPD. Electronically Signed   By: Monte Fantasia M.D.   On: 03/02/2016 11:55   Ct Angio Chest Pe W Or Wo Contrast  Result Date: 03/02/2016 CLINICAL DATA:  83 year old male with hemoptysis. Cough since yesterday. Intermittent shortness of breath. Initial encounter. EXAM: CT ANGIOGRAPHY CHEST WITH CONTRAST TECHNIQUE: Multidetector CT imaging of the chest was performed using the standard protocol during bolus administration of intravenous contrast. Multiplanar CT image reconstructions and MIPs were obtained to evaluate the vascular anatomy. CONTRAST:  100 mL Isovue 370 COMPARISON:  Chest radiographs 1148 hours today and earlier. CT Abdomen and Pelvis  11/22/2015. FINDINGS: Cardiovascular: Good contrast bolus timing in the pulmonary arterial tree. No focal filling defect identified in the pulmonary arteries to suggest acute pulmonary embolism. Calcified aortic and coronary artery atherosclerosis. No pericardial effusion. Negative visible aorta otherwise. Mediastinum/Nodes: No mediastinal lymphadenopathy. Mild, reactive  appearing bilateral hilar lymph nodes. Lungs/Pleura: Globular 2 cm focus of retained secretions in the trachea at the thoracic inlet (series 5, image 27). Minimal additional retained secretions along the more distal right lateral wall of the trachea. Other Major airways are patent. Confluent bilateral lower lobe and right middle lobe peribronchial and dependent ground-glass opacity, nearing consolidation in the right lung. Superimposed small bilateral layering pleural effusions. Superimposed right costophrenic angle bronchiectasis versus paraseptal emphysema. The upper lobes are spared. There is occasional upper lobe centrilobular emphysema. Upper Abdomen: Negative visualized liver, gallbladder, spleen, pancreas, adrenal glands, kidneys, and bowel in the upper abdomen. Musculoskeletal: Minimally to mildly displaced lateral left sixth, seventh, and eighth rib fractures are new since November and appear acute. No other No acute osseous abnormality identified. Review of the MIP images confirms the above findings. IMPRESSION: 1.  No evidence of acute pulmonary embolus. 2. Right greater than left lower lobe and right middle lobe pneumonia. Small layering bilateral pleural effusions. Superimposed right costophrenic angle bronchiectasis versus emphysema. 3. Acute appearing left lateral sixth through eighth rib fractures. 4. Two cm globular focus of retained secretions in the trachea at the thoracic inlet. 5. Calcified aortic and coronary artery atherosclerosis. Electronically Signed   By: Genevie Ann M.D.   On: 03/02/2016 14:24    Procedures Procedures  (including critical care time)  Medications Ordered in ED Medications  gabapentin (NEURONTIN) capsule 900 mg (not administered)  docusate sodium (COLACE) capsule 100 mg (not administered)  heparin injection 5,000 Units (not administered)  sodium chloride flush (NS) 0.9 % injection 3 mL (not administered)  sodium chloride flush (NS) 0.9 % injection 3 mL (not administered)  sodium chloride flush (NS) 0.9 % injection 3 mL (not administered)  0.9 %  sodium chloride infusion (not administered)  acetaminophen (TYLENOL) tablet 650 mg (not administered)    Or  acetaminophen (TYLENOL) suppository 650 mg (not administered)  polyethylene glycol (MIRALAX / GLYCOLAX) packet 17 g (not administered)  azithromycin (ZITHROMAX) tablet 250 mg (not administered)  0.9 %  sodium chloride infusion ( Intravenous New Bag/Given 03/02/16 1859)  hydrALAZINE (APRESOLINE) injection 5 mg (not administered)  cefTRIAXone (ROCEPHIN) 1 g in dextrose 5 % 50 mL IVPB (0 g Intravenous Stopped 03/02/16 1420)  azithromycin (ZITHROMAX) 500 mg in dextrose 5 % 250 mL IVPB (0 mg Intravenous Stopped 03/02/16 1628)  sodium chloride 0.9 % bolus 1,000 mL (0 mLs Intravenous Stopped 03/02/16 1332)  iopamidol (ISOVUE-370) 76 % injection (100 mLs  Contrast Given 03/02/16 1403)  ipratropium-albuterol (DUONEB) 0.5-2.5 (3) MG/3ML nebulizer solution 3 mL (3 mLs Nebulization Given 03/02/16 1423)     Initial Impression / Assessment and Plan / ED Course  I have reviewed the triage vital signs and the nursing notes.  Pertinent labs & imaging results that were available during my care of the patient were reviewed by me and considered in my medical decision making (see chart for details).    83 yo M with PMHx recent rib fx here with cough, sputum production, and SOB. CXR shows multifocal PNA versus alveolar hemorrhage. VSS and pt satting well but lung exam does show diffuse rhonchi, increased WOB. Given recent trauma, CT obtained which shows multifocal  PNA. No PE or PTX. Labs o/w near baseline. Will admit for CAP 2/2 rib fx given age, initial tachycardia, comorbidities.  Final Clinical Impressions(s) / ED Diagnoses   Final diagnoses:  Hemoptysis  Community acquired pneumonia, unspecified laterality    New Prescriptions Current Discharge Medication List  Duffy Bruce, MD 03/02/16 1900

## 2016-03-03 ENCOUNTER — Encounter (HOSPITAL_COMMUNITY): Payer: Self-pay | Admitting: *Deleted

## 2016-03-03 DIAGNOSIS — J189 Pneumonia, unspecified organism: Secondary | ICD-10-CM

## 2016-03-03 LAB — BASIC METABOLIC PANEL
Anion gap: 9 (ref 5–15)
BUN: 12 mg/dL (ref 6–20)
CALCIUM: 8.2 mg/dL — AB (ref 8.9–10.3)
CHLORIDE: 104 mmol/L (ref 101–111)
CO2: 22 mmol/L (ref 22–32)
CREATININE: 1.13 mg/dL (ref 0.61–1.24)
GFR calc non Af Amer: 59 mL/min — ABNORMAL LOW (ref >60)
Glucose, Bld: 83 mg/dL (ref 65–99)
Potassium: 4.1 mmol/L (ref 3.5–5.1)
SODIUM: 135 mmol/L (ref 135–145)

## 2016-03-03 LAB — CBC
HCT: 31.3 % — ABNORMAL LOW (ref 39.0–52.0)
Hemoglobin: 10.5 g/dL — ABNORMAL LOW (ref 13.0–17.0)
MCH: 27.9 pg (ref 26.0–34.0)
MCHC: 33.5 g/dL (ref 30.0–36.0)
MCV: 83.2 fL (ref 78.0–100.0)
PLATELETS: 241 10*3/uL (ref 150–400)
RBC: 3.76 MIL/uL — AB (ref 4.22–5.81)
RDW: 13.7 % (ref 11.5–15.5)
WBC: 6.7 10*3/uL (ref 4.0–10.5)

## 2016-03-03 MED ORDER — AZITHROMYCIN 250 MG PO TABS: 250.0000 mg | ORAL_TABLET | Freq: Every day | ORAL | 0 refills | Status: DC

## 2016-03-03 NOTE — Evaluation (Signed)
Physical Therapy Evaluation Patient Details Name: Chad [name] MRN: [id number] DOB: 04-Sep-1931 Today's Date: 03/03/2016   History of Present Illness  Chad [name] is a 83 y.o. male presenting with cough and multifocal pneumonia. PMH is significant for HTN, COPD, GERD, hx of throat cancer s/p resection (1999), atrial flutter, aortic stenosis and anemia.  Clinical Impression  Pt walked 100 feet on room air - O2 sats to 91%.  Education complete from PT standpoint and pt ready for DC.    Follow Up Recommendations No PT follow up    Equipment Recommendations  None recommended by PT    Recommendations for Other Services       Precautions / Restrictions Precautions Precautions: None Restrictions Other Position/Activity Restrictions: pt denies history of falls - except slipping on ice on his porch one morning      Mobility  Bed Mobility Overal bed mobility: Independent                Transfers Overall transfer level: Needs assistance   Transfers: Sit to/from Stand Sit to Stand: Min guard            Ambulation/Gait Ambulation/Gait assistance: Min guard Ambulation Distance (Feet): 100 Feet Assistive device: None Gait Pattern/deviations: Step-through pattern;Decreased stride length     General Gait Details: pt did well - he was a little hesitant of walking as hasnt walked in 2 days.  his feet also hurt in hospital socks as he didnt have his shoes.  no loss of balance noticed.  no SOB  Stairs            Wheelchair Mobility    Modified Rankin (Stroke Patients Only)       Balance Overall balance assessment: No apparent balance deficits (not formally assessed)                                           Pertinent Vitals/Pain Pain Assessment: 0-10 Pain Score: 4  Pain Location: left rib pain - mostly with laying on it or supine to sit.  no pain at rest or with deep breaths Pain Intervention(s): Monitored during session    [place] expects to be discharged to:: Private residence Living Arrangements:  (lives with x-wife and brother in [place])     Home Access: Ramped entrance     [place]: One level [place]: [place] - single point Additional Comments: pt used cane when walking long distances prior to admit    Prior Function Level of Independence: Independent with assistive device(s)         Comments: pt has arthritic feet and shoes that make feet feel better.  pt reports he was getting out into community and driving independently with no problems     Hand Dominance        Extremity/Trunk Assessment        Lower Extremity Assessment Lower Extremity Assessment: Generalized weakness    Cervical / Trunk Assessment Cervical / Trunk Assessment: Normal  Communication   Communication: No difficulties  Cognition Arousal/Alertness: Awake/alert Behavior During Therapy: WFL for tasks assessed/performed Overall Cognitive Status: Within Functional Limits for tasks assessed                      General Comments General comments (skin integrity, edema, etc.): pts pulse at rest 68bpm and pulse ox 93%.  pt walked 100  feet and pulse 90bpm and 91% with no SOB or coughing seen.  I gave pt an inspirometer and educated him on  how to use this - he was able to get to 1750 ml x 5 reps with no rib pain.    Exercises     Assessment/Plan    PT Assessment Patent does not need any further PT services  PT Problem List         PT Treatment Interventions      PT Goals (Current goals can be found in the Care Plan section)  Acute Rehab PT Goals Patient Stated Goal: to go home today PT Goal Formulation: With patient/family Time For Goal Achievement: 03/03/16 Potential to Achieve Goals: Good    Frequency     Barriers to discharge        Co-evaluation               End of Session Equipment Utilized During Treatment: Gait belt Activity Tolerance: Patient tolerated  treatment well Patient left: in chair;with call bell/phone within reach Nurse Communication: Mobility status PT Visit Diagnosis: Muscle weakness (generalized) (M62.81)         Time: [id number] PT Time Calculation (min) (ACUTE ONLY): 30 min   Charges:   PT Evaluation $PT Eval Low Complexity: 1 Procedure PT Treatments $Gait Training: 8-22 mins   PT G Codes:         Loyal Buba 03/03/2016, 12:14 PM 03/03/2016   Rande Lawman, PT

## 2016-03-03 NOTE — Progress Notes (Signed)
Nsg Discharge Note  Admit Date:  03/02/2016 Discharge date: 03/03/2016   [facility] to be D/C'd Home per MD order.  AVS completed.  Copy for chart, and copy for patient signed, and dated. Patient/caregiver able to verbalize understanding.  Discharge Medication: Allergies as of 03/03/2016      Reactions   Dyazide [hydrochlorothiazide W-triamterene] Other (See Comments)   Caused hyponatremia 04/2012      Medication List    TAKE these medications   azithromycin 250 MG tablet Commonly known as:  ZITHROMAX Take 1 tablet (250 mg total) by mouth daily. Start taking on:  03/04/2016   cholecalciferol 1000 units tablet Commonly known as:  VITAMIN D Take 2,000 Units by mouth daily.   clidinium-chlordiazePOXIDE 5-2.5 MG capsule Commonly known as:  LIBRAX Take 1 capsule by mouth 2 (two) times daily. What changed:  when to take this   cyanocobalamin 1000 MCG tablet Take 1,000 mcg by mouth daily.   diltiazem 30 MG tablet Commonly known as:  CARDIZEM Take 1 tablet (30 mg total) by mouth every 6 (six) hours as needed. For palpitations.   docusate sodium 100 MG capsule Commonly known as:  COLACE Take 100 mg by mouth daily as needed for mild constipation.   fluticasone 50 MCG/ACT nasal spray Commonly known as:  FLONASE Place 2 sprays into both nostrils daily.   gabapentin 300 MG capsule Commonly known as:  NEURONTIN Take 3 capsules at bedtime.   lisinopril 10 MG tablet Commonly known as:  PRINIVIL,ZESTRIL Take 1 tablet (10 mg total) by mouth daily.   multivitamin-lutein Caps capsule Take 1 capsule by mouth daily.   omeprazole 20 MG capsule Commonly known as:  PRILOSEC Take 1 capsule (20 mg total) by mouth daily. Take one hour before breakfast       Discharge Assessment: Vitals:   03/02/16 2321 03/03/16 0503  BP: (!) 146/74 (!) 160/76  Pulse:  74  Resp: 19 19  Temp: 98.4 F (36.9 C) 98.5 F (36.9 C)   Skin clean, dry and intact without evidence of skin break  down, no evidence of skin tears noted. IV catheter discontinued intact. Site without signs and symptoms of complications - no redness or edema noted at insertion site, patient denies c/o pain - only slight tenderness at site.  Dressing with slight pressure applied.  D/c Instructions-Education: Discharge instructions given to patient/family with verbalized understanding. D/c education completed with patient/family including follow up instructions, medication list, d/c activities limitations if indicated, with other d/c instructions as indicated by MD - patient able to verbalize understanding, all questions fully answered. Patient instructed to return to ED, call 911, or call MD for any changes in condition.  Patient escorted via [place], and D/C home via private auto.  Kioni Stahl Margaretha Sheffield, RN 03/03/2016 2:51 PM

## 2016-03-03 NOTE — Discharge Instructions (Signed)
-   Take the prescribed azithromycin once a day for the next 3 days. This prescription has been sent to your pharmacy. - I would like for you to take home your incentive spirometer. Use this every 4-6 hours for the next 5 days. - Please make a follow-up appointment with your primary care provider within the next week.    Community-Acquired Pneumonia, Adult Introduction Pneumonia is an infection of the lungs. One type of pneumonia can happen while a person is in a hospital. A different type can happen when a person is not in a hospital (community-acquired pneumonia). It is easy for this kind to spread from person to person. It can spread to you if you breathe near an infected person who coughs or sneezes. Some symptoms include:  A dry cough.  A wet (productive) cough.  Fever.  Sweating.  Chest pain. Follow these instructions at home:  Take over-the-counter and prescription medicines only as told by your doctor.  Only take cough medicine if you are losing sleep.  If you were prescribed an antibiotic medicine, take it as told by your doctor. Do not stop taking the antibiotic even if you start to feel better.  Sleep with your head and neck raised (elevated). You can do this by putting a few pillows under your head, or you can sleep in a recliner.  Do not use tobacco products. These include cigarettes, chewing tobacco, and e-cigarettes. If you need help quitting, ask your doctor.  Drink enough water to keep your pee (urine) clear or pale yellow. A shot (vaccine) can help prevent pneumonia. Shots are often suggested for:  People older than 81 years of age.  People older than 81 years of age:  Who are having cancer treatment.  Who have long-term (chronic) lung disease.  Who have problems with their body's defense system (immune system). You may also prevent pneumonia if you take these actions:  Get the flu (influenza) shot every year.  Go to the dentist as often as told.  Wash  your hands often. If soap and water are not available, use hand sanitizer. Contact a doctor if:  You have a fever.  You lose sleep because your cough medicine does not help. Get help right away if:  You are short of breath and it gets worse.  You have more chest pain.  Your sickness gets worse. This is very serious if:  You are an older adult.  Your body's defense system is weak.  You cough up blood. This information is not intended to replace advice given to you by your health care provider. Make sure you discuss any questions you have with your health care provider. Document Released: 06/13/2007 Document Revised: 06/02/2015 Document Reviewed: 04/21/2014  2017 [employer]vier

## 2016-03-03 NOTE — Discharge Summary (Signed)
[facility] Discharge Summary  Patient name: Chad [name] Medical record number: [id number] Date of birth: [birth year] Age: 83 y.o. Gender: male Date of Admission: 03/02/2016  Date of Discharge: 03/03/2016  Admitting Physician: Lind Covert, MD  Primary Care Provider: Reginia Forts, MD Consultants: none  Indication for Hospitalization: CAP  Discharge Diagnoses/Problem List:  CAP Rib fracture 3, left AKI Hyponatremia Hypertension GERD  Disposition: home  Discharge Condition: stable  Discharge Exam:  General: elderly man laying in bed in NAD HEENT: MMM, EOMI, PERRLA, OP erythematous without evidence of exudates, no LAD, neck full ROM. Slight asymmetry noted  Cardiovascular: RRR. 3/6 holosystolic murmur.  Respiratory: no increased work of breathing, bilateral lung bases with crackles  Gastrointestinal: soft, non-tender, non-distended, +BS MSK: no edema or cyanosis, TTP of thoracic wall (L>R) Neuro: CN2-12 in tact. 5/5 strength bilaterally in upper and lower extremities. Sensation in tact throughout  Brief Hospital Course:  Patient is a 83 year old male who presented with persistent and worsening cough over the past few days. Symptoms seemed to have first developed after sustaining a fall about 4 weeks back. After this fall he had significant bruising over his left thoracic wall and severe pain with deep inspiration. Recently he had noticed some development of hemoptysis over the days leading up to admission. He denied any fever or chills but had had increased sputum production.   In the ED he had been noted to have a slight AKI, normal lactic acid, and normal white blood cell count. Chest x-ray showed a paced disease at the bases bilaterally, due to his history of trauma a CTA was performed to rule out substantial pleural hemorrhage. CTA showed bilateral middle and lower lobe pneumonia, bronchiectasis/emphysema, and 6-8th left rib fractures. He  was provided one dose of ceftriaxone and azithromycin, and was admitted and placed under observation.  Overnight patient had no new issues. Patient was not requiring any supplemental oxygen. He looked well and was comfortable with discharge. Patient was discharged with a prescription for azithromycin and instructions to continue incentive spirometry over the next 5 days.  Issues for Follow Up:  1. F/u completion of abx 2. F/u hemoptysis; deemed most likely secondary to rib fracture/lung contusion and should be self-limited. May require additional workup if it persists 3. Consider repeat BMP to monitor kidney function and hyponatremia 4. Monitor blood pressure  Significant Procedures: none  Significant Labs and Imaging:   Recent Labs Lab 03/02/16 1222 03/03/16 0536  WBC 9.0 6.7  HGB 11.7* 10.5*  HCT 35.1* 31.3*  PLT 261 241    Recent Labs Lab 03/02/16 1222 03/03/16 0536  NA 131* 135  K 4.5 4.1  CL 98* 104  CO2 24 22  GLUCOSE 109* 83  BUN 21* 12  CREATININE 1.45* 1.13  CALCIUM 8.7* 8.2*  ALKPHOS 60  --   AST 45*  --   ALT 26  --   ALBUMIN 3.0*  --     Results/Tests Pending at Time of Discharge: none  Discharge Medications:  Allergies as of 03/03/2016      Reactions   Dyazide [hydrochlorothiazide W-triamterene] Other (See Comments)   Caused hyponatremia 04/2012      Medication List    TAKE these medications   azithromycin 250 MG tablet Commonly known as:  ZITHROMAX Take 1 tablet (250 mg total) by mouth daily. Start taking on:  03/04/2016   cholecalciferol 1000 units tablet Commonly known as:  VITAMIN D Take 2,000 Units by mouth daily.  clidinium-chlordiazePOXIDE 5-2.5 MG capsule Commonly known as:  LIBRAX Take 1 capsule by mouth 2 (two) times daily. What changed:  when to take this   cyanocobalamin 1000 MCG tablet Take 1,000 mcg by mouth daily.   diltiazem 30 MG tablet Commonly known as:  CARDIZEM Take 1 tablet (30 mg total) by mouth every 6 (six)  hours as needed. For palpitations.   docusate sodium 100 MG capsule Commonly known as:  COLACE Take 100 mg by mouth daily as needed for mild constipation.   fluticasone 50 MCG/ACT nasal spray Commonly known as:  FLONASE Place 2 sprays into both nostrils daily.   gabapentin 300 MG capsule Commonly known as:  NEURONTIN Take 3 capsules at bedtime.   lisinopril 10 MG tablet Commonly known as:  PRINIVIL,ZESTRIL Take 1 tablet (10 mg total) by mouth daily.   multivitamin-lutein Caps capsule Take 1 capsule by mouth daily.   omeprazole 20 MG capsule Commonly known as:  PRILOSEC Take 1 capsule (20 mg total) by mouth daily. Take one hour before breakfast       Discharge Instructions: Please refer to Patient Instructions section of EMR for full details.  Patient was counseled important signs and symptoms that should prompt return to medical care, changes in medications, dietary instructions, activity restrictions, and follow up appointments.   Follow-Up Appointments: Follow-up Information    SMITH,KRISTI, MD. Schedule an appointment as soon as possible for a visit in 1 week(s).   Specialty:  Family Medicine Contact information: [place] [id number]           Elberta Leatherwood, MD 03/03/2016, 12:40 PM PGY-3, [place]

## 2016-03-05 NOTE — Progress Notes (Signed)
Late entry for missed G-code. Based on review of the evaluation and AM_PAC 6 Clicks basic Mobility Score  by Rande Lawman, PT.    03-12-2016 1230  PT G-Codes **NOT FOR INPATIENT CLASS**  Functional Assessment Tool Used AM-PAC 6 Clicks Basic Mobility  Functional Limitation Mobility: Walking and moving around  Mobility: Walking and Moving Around Current Status [id number]) CJ  Mobility: Walking and Moving Around Goal Status [id number]) CJ  Mobility: Walking and Moving Around Discharge Status [phone]) Collier Bullock, [place] 03/05/2016

## 2016-03-07 LAB — CULTURE, BLOOD (ROUTINE X 2)
CULTURE: NO GROWTH
Culture: NO GROWTH

## 2016-03-14 ENCOUNTER — Ambulatory Visit (INDEPENDENT_AMBULATORY_CARE_PROVIDER_SITE_OTHER): Payer: Medicare Other | Admitting: Family Medicine

## 2016-03-14 ENCOUNTER — Encounter: Payer: Self-pay | Admitting: Family Medicine

## 2016-03-14 VITALS — BP 162/88 | HR 118 | Temp 97.9°F | Resp 18 | Ht 74.0 in | Wt 176.8 lb

## 2016-03-14 DIAGNOSIS — N171 Acute kidney failure with acute cortical necrosis: Secondary | ICD-10-CM

## 2016-03-14 DIAGNOSIS — R042 Hemoptysis: Secondary | ICD-10-CM | POA: Diagnosis not present

## 2016-03-14 DIAGNOSIS — J189 Pneumonia, unspecified organism: Secondary | ICD-10-CM

## 2016-03-14 DIAGNOSIS — S2242XD Multiple fractures of ribs, left side, subsequent encounter for fracture with routine healing: Secondary | ICD-10-CM | POA: Diagnosis not present

## 2016-03-14 DIAGNOSIS — I10 Essential (primary) hypertension: Secondary | ICD-10-CM

## 2016-03-14 MED ORDER — POLYETHYLENE GLYCOL 3350 17 GM/SCOOP PO POWD: 17.0000 g | Freq: Two times a day (BID) | ORAL | 1 refills | Status: DC | PRN

## 2016-03-14 NOTE — Progress Notes (Deleted)
Patient ID: Chad [name], male    DOB: 1931/04/08, 83 y.o.   MRN: [id number]  PCP: Reginia Forts, MD  Chief Complaint  Patient presents with  . Follow-up    hospital pnuemonia    Subjective:   Presents for follow up from his recent hospitalization (03/03/2016) for community acquired pneumonia. He states he is feeling well, and denies shortness of breath, trouble breathing or hemoptysis. States he can go up one flight of stairs if needed without trouble. Currently he his sitting upright in his bed to sleep due to his fractured ribs and states he was never given an end date to this. He usually sleeps on his side but due to the fracture he knows he can't right now.  States he got new dentures and had made eating alittle more challenging but he's still eating regularly foods just "bland" He has been using his Incentive spirometry periodically but not regullarly.   Review of Systems As above      Patient Active Problem List   Diagnosis Date Noted  . Community acquired pneumonia   . Pneumonia of both lungs due to infectious organism 03/02/2016  . 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 Anal lesion 06/07/2015  . Gas 01/18/2015  . Aortic stenosis 06/08/2013  . Unsteadiness 02/11/2013  . Ectopic atrial tachycardia ([place]) 01/12/2013  . Atrial tachycardia ([place]) 01/12/2013  . Atrial flutter ([place]) 05/06/2012  . Aortic valve sclerosis 05/06/2012  . Acute renal failure ([place]) 05/05/2012  . 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 Anemia 07/27/2010  . Essential hypertension 11/15/2008  . COPD 11/15/2008  . GERD 11/15/2008  . IBS 11/15/2008     Prior to Admission medications   Medication Sig Start Date End Date Taking? Authorizing Provider  azithromycin (ZITHROMAX) 250 MG tablet Take 1 tablet (250 mg total) by mouth daily. 03/04/16  Yes Elberta Leatherwood, MD  cholecalciferol (VITAMIN D) 1000 units tablet Take 2,000  Units by mouth daily.    Yes Historical Provider, MD  clidinium-chlordiazePOXIDE (LIBRAX) 5-2.5 MG capsule Take 1 capsule by mouth 2 (two) times daily. Patient taking differently: Take 1 capsule by mouth 3 (three) times daily before meals.  01/24/16  Yes Golden Circle, FNP  cyanocobalamin 1000 MCG tablet Take 1,000 mcg by mouth daily.   Yes Historical Provider, MD  diltiazem (CARDIZEM) 30 MG tablet Take 1 tablet (30 mg total) by mouth every 6 (six) hours as needed. For palpitations. 10/20/15  Yes Lelon Perla, MD  docusate sodium (COLACE) 100 MG capsule Take 100 mg by mouth daily as needed for mild constipation.   Yes Historical Provider, MD  fluticasone (FLONASE) 50 MCG/ACT nasal spray Place 2 sprays into both nostrils daily. 05/31/15  Yes Wardell Honour, MD  gabapentin (NEURONTIN) 300 MG capsule Take 3 capsules at bedtime. 05/31/15  Yes Wardell Honour, MD  lisinopril (PRINIVIL,ZESTRIL) 10 MG tablet Take 1 tablet (10 mg total) by mouth daily. 05/31/15  Yes Wardell Honour, MD  multivitamin-lutein [facility]) CAPS capsule Take 1 capsule by mouth daily.   Yes Historical Provider, MD  omeprazole (PRILOSEC) 20 MG capsule Take 1 capsule (20 mg total) by mouth daily. Take one hour before breakfast 05/31/15  Yes Wardell Honour, MD     Allergies  Allergen Reactions  . Dyazide [Hydrochlorothiazide W-Triamterene] Other (See Comments)    Caused hyponatremia 04/2012       Objective:  Physical Exam  Assessment & Plan:   ***

## 2016-03-14 NOTE — Progress Notes (Deleted)
Subjective:    Patient ID: Chad [name], male    DOB: 1931-03-31, 83 y.o.   MRN: [id number]  03/14/2016  Follow-up (hospital pnuemonia)   HPI This 83 y.o. male presents for TRANSITION INTO CARE for hospital admission for community acquired pneumonia, three rib fractures, hyponatremia.  Doing well.  Denies fever/chills/sweats. Denies ongoing cough; denies SOB; pain from rib fractures mostly gone. No further hemoptysis.  Here are details of admission and discharge summary:  Date of Admission: 03/02/2016                      Date of Discharge: 03/03/2016  Admitting Physician: Carney Living, MD  Primary Care Provider: Nilda Simmer, MD Consultants: none  Indication for Hospitalization: CAP  Discharge Diagnoses/Problem List:  CAP Rib fracture 3, left AKI Hyponatremia Hypertension GERD  Disposition: home  Discharge Condition: stable  Discharge Exam:  General: elderly man laying in bed in NAD HEENT: MMM, EOMI, PERRLA, OP erythematous without evidence of exudates, no LAD, neck full ROM. Slight asymmetry noted  Cardiovascular: RRR. 3/6 holosystolic murmur.  Respiratory: no increased work of breathing, bilateral lung bases with crackles  Gastrointestinal: soft, non-tender, non-distended, +BS MSK: no edema or cyanosis, TTP of thoracic wall (L>R) Neuro: CN2-12 in tact. 5/5 strength bilaterally in upper and lower extremities. Sensation in tact throughout  Brief Hospital Course:  Patient is a 83 year old male who presented with persistent and worsening cough over the past few days. Symptoms seemed to have first developed after sustaining a fall about 4 weeks back. After this fall he had significant bruising over his left thoracic wall and severe pain with deep inspiration. Recently he had noticed some development of hemoptysis over the days leading up to admission. He denied any fever or chills but had had increased sputum production.   In the ED he had been noted to  have a slight AKI, normal lactic acid, and normal white blood cell count. Chest x-ray showed a paced disease at the bases bilaterally, due to his history of trauma a CTA was performed to rule out substantial pleural hemorrhage. CTA showed bilateral middle and lower lobe pneumonia, bronchiectasis/emphysema, and 6-8th left rib fractures. He was provided one dose of ceftriaxone and azithromycin, and was admitted and placed under observation.  Overnight patient had no new issues. Patient was not requiring any supplemental oxygen. He looked well and was comfortable with discharge. Patient was discharged with a prescription for azithromycin and instructions to continue incentive spirometry over the next 5 days.  Issues for Follow Up:  1. F/u completion of abx 2. F/u hemoptysis; deemed most likely secondary to rib fracture/lung contusion and should be self-limited. May require additional workup if it persists 3. Consider repeat BMP to monitor kidney function and hyponatremia 4. Monitor blood pressure   Review of Systems  Constitutional: Negative for activity change, appetite change, chills, diaphoresis, fatigue and fever.  Respiratory: Negative for cough and shortness of breath.   Cardiovascular: Negative for chest pain, palpitations and leg swelling.  Gastrointestinal: Negative for abdominal pain, diarrhea, nausea and vomiting.  Endocrine: Negative for cold intolerance, heat intolerance, polydipsia, polyphagia and polyuria.  Skin: Negative for color change, rash and wound.  Neurological: Negative for dizziness, tremors, seizures, syncope, facial asymmetry, speech difficulty, weakness, light-headedness, numbness and headaches.  Psychiatric/Behavioral: Negative for dysphoric mood and sleep disturbance. The patient is not nervous/anxious.     Past Medical History:  Diagnosis Date  . Abnormal EKG    ST changes with  normal coronaries by cath 05/06/12.  . Arthritis    "hands" (01/12/2013); feet  (podiatry consultation)  . Atrial flutter (HCC)    a. Slow ~100bpm when in 2:1; dx 04/2012. b. Placed on Xarelto.  c. s/p ablation 07-08-2012 by Dr Johney Frame  . Atrial tachycardia (HCC)   . Carotid artery calcification    By CXR - dopplers 05/06/12 without obvious evidence of significant ICA stenosis >40%  . Colon polyps 06/26/2005  . COPD (chronic obstructive pulmonary disease) (HCC)   . Diverticulosis of colon (without mention of hemorrhage)   . Dysrhythmia   . Gastritis, chronic    Pt denies bleeding 04/2012. EGD 2012 reportedly normal.  . GERD (gastroesophageal reflux disease)   . Hypertension   . Hyponatremia    04/2012 r/t diuretic.  . IBS (irritable bowel syndrome)   . Internal and external hemorrhoids without complication   . Macular degeneration of left eye   . 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 Pneumonia    "couple times" (01/12/2013)  . Shortness of breath dyspnea    walking distances   Past Surgical History:  Procedure Laterality Date  . ATRIAL FLUTTER ABLATION  07-08-2012   of typical atrial flutter by Dr Johney Frame  . ATRIAL FLUTTER ABLATION N/A 07/08/2012   Procedure: ATRIAL FLUTTER ABLATION;  Surgeon: Hillis Range, MD;  [facility] LAB;  Service: Cardiovascular;  Laterality: N/A;  . CARDIAC CATHETERIZATION  04/2012  . COLONOSCOPY  08/29/10   diverticulosis, internal hemorrhoids  . ESOPHAGOGASTRODUODENOSCOPY  09/20/10   normal  . EYE SURGERY  01/08/2014   Cataract resection L.    [name] HEMORRHOID SURGERY  1968  . LEFT HEART CATHETERIZATION WITH CORONARY ANGIOGRAM N/A 05/06/2012   Procedure: LEFT HEART CATHETERIZATION WITH CORONARY ANGIOGRAM;  Surgeon: Peter M Swaziland, MD;  Location: [facility] CATH LAB;  Service: Cardiovascular;  Laterality: N/A;  . 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 TRIGGER FINGER RELEASE Right 1970's   "2 fingers"  . WART FULGURATION Left 09/15/2015   Procedure: EXCISIONal biospy of left peri anual and anual canal mass;  Surgeon: Gaynelle Adu, MD;  Location:  [facility] ORS;  Service: General;  Laterality: Left;   Allergies  Allergen Reactions  . Dyazide [Hydrochlorothiazide W-Triamterene] Other (See Comments)    Caused hyponatremia 04/2012    Social History   Social History  . Marital status: Divorced    Spouse name: N/A  . Number of children: 2  . Years of education: 16   Occupational History  . Retired    Social History Main Topics  . Smoking status: Former Smoker    Packs/day: 0.50    Years: 20.00    Types: Cigarettes, Pipe, Cigars    Quit date: 01/08/1966  . Smokeless tobacco: Never Used     Comment:    . Alcohol use 0.0 oz/week     Comment: 01/12/2013 "did drink 2-3 12 oz beers a day; stopped drinking in 04/2012"  . Drug use: No  . Sexual activity: No   Other Topics Concern  . Not on file   Social History Narrative   Marital status: divorced; lives with ex-wife; not dating in 2017.      Children:  2 sons, 2 grandsons, 1 granddaughter; no gg.      Lives: with ex-wife in house.      Employment: retired first time age 86; retired in 1997.  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      Tobacco: quit smoking  1997; smoked for 27 years.      Alcohol: quit 2004      Exercise:  Walking daily short distances.       ADLs:  NO assistant devices; has cane if needs it; drives; pays bill; grocery shopping by wife; wife cleans house and does laundry.     Advanced Directives:  +LIVING WILL; +FULL CODE.  HCPOA: oldest son Tejuan Curler Sr.)      Family History  Problem Relation Age of Onset  . Heart disease Brother   . Throat cancer Brother   . Other Mother     UNSURE  . Other Father     UNSURE  . Emphysema Sister   . Emphysema Sister   . Other Brother     UNSURE  . Diabetes         Objective:    BP (!) 162/88   Pulse (!) 118   Temp 97.9 F (36.6 C) (Oral)   Resp 18   Ht 6\' 2"  (1.88 m)   Wt 176 lb 12.8 oz (80.2 kg)   SpO2 96%   BMI 22.70 kg/m  Physical Exam  Constitutional: He is oriented to person, place,  and time. He appears well-developed and well-nourished. No distress.  HENT:  Head: Normocephalic and atraumatic.  Right Ear: External ear normal.  Left Ear: External ear normal.  Nose: Nose normal.  Mouth/Throat: Oropharynx is clear and moist.  Eyes: Conjunctivae and EOM are normal. Pupils are equal, round, and reactive to light.  Neck: Normal range of motion. Neck supple. Carotid bruit is not present. No thyromegaly present.  Cardiovascular: Normal rate, regular rhythm, normal heart sounds and intact distal pulses.  Exam reveals no gallop and no friction rub.   No murmur heard. Pulmonary/Chest: Effort normal and breath sounds normal. He has no wheezes. He has no rales.  Abdominal: Soft. Bowel sounds are normal. He exhibits no distension and no mass. There is no tenderness. There is no rebound and no guarding.  Lymphadenopathy:    He has no cervical adenopathy.  Neurological: He is alert and oriented to person, place, and time. No cranial nerve deficit.  Skin: Skin is warm and dry. No rash noted. He is not diaphoretic.  Psychiatric: He has a normal mood and affect. His behavior is normal.  Nursing note and vitals reviewed.       Assessment & Plan:   1. Pneumonia of both lungs due to infectious organism, unspecified part of lung   2. Closed fracture of multiple ribs of left side with routine healing, subsequent encounter   3. Essential hypertension, benign   4. Acute renal failure with acute cortical necrosis (HCC)   5. Hemoptysis    -doing well s/p hospital admission; no further hemoptysis since discharge. -has completed abx course. -obtain labs. -continue Mucinex DM as needed for cough or congestion.  Orders Placed This Encounter  Procedures  . CBC with Differential/Platelet  . Comprehensive metabolic panel   Meds ordered this encounter  Medications  . polyethylene glycol powder (GLYCOLAX/MIRALAX) powder    Sig: Take 17 g by mouth 2 (two) times daily as needed.     Dispense:  3350 g    Refill:  1    Return in about 2 months (around 05/14/2016).   Chad [name], M.D. Primary Care at [facility] previously [facility] [address] [place], [place]  [address] [phone] phone [phone] fax

## 2016-03-14 NOTE — Patient Instructions (Addendum)
  Use Mucinex over the counter to help with thick or difficult to expel phlegm. Take as needed based on the instructions on the packaging.    IF you received an x-ray today, you will receive an invoice from [facility]. Please contact [facility] at [phone] with questions or concerns regarding your invoice.   IF you received labwork today, you will receive an invoice from [facility]. Please contact [facility] at [phone] with questions or concerns regarding your invoice.   Our billing staff will not be able to assist you with questions regarding bills from these companies.  You will be contacted with the lab results as soon as they are available. The fastest way to get your results is to activate your My Chart account. Instructions are located on the last page of this paperwork. If you have not heard from [place] regarding the results in 2 weeks, please contact this office.

## 2016-03-15 DIAGNOSIS — H353221 Exudative age-related macular degeneration, left eye, with active choroidal neovascularization: Secondary | ICD-10-CM | POA: Diagnosis not present

## 2016-03-17 LAB — COMPREHENSIVE METABOLIC PANEL
A/G RATIO: 1.1 — AB (ref 1.2–2.2)
ALT: 39 IU/L (ref 0–44)
AST: 43 IU/L — AB (ref 0–40)
Albumin: 3.6 g/dL (ref 3.5–4.7)
Alkaline Phosphatase: 75 IU/L (ref 39–117)
BUN/Creatinine Ratio: 10 (ref 10–24)
BUN: 11 mg/dL (ref 8–27)
Bilirubin Total: 0.2 mg/dL (ref 0.0–1.2)
CALCIUM: 9.2 mg/dL (ref 8.6–10.2)
CO2: 22 mmol/L (ref 18–29)
Chloride: 96 mmol/L (ref 96–106)
Creatinine, Ser: 1.09 mg/dL (ref 0.76–1.27)
GFR, EST AFRICAN AMERICAN: 73 mL/min/{1.73_m2} (ref >59)
GFR, EST NON AFRICAN AMERICAN: 63 mL/min/{1.73_m2} (ref >59)
GLOBULIN, TOTAL: 3.3 g/dL (ref 1.5–4.5)
Glucose: 90 mg/dL (ref 65–99)
POTASSIUM: 5 mmol/L (ref 3.5–5.2)
SODIUM: 137 mmol/L (ref 134–144)
TOTAL PROTEIN: 6.9 g/dL (ref 6.0–8.5)

## 2016-03-17 LAB — CBC WITH DIFFERENTIAL/PLATELET

## 2016-03-30 ENCOUNTER — Encounter: Payer: Self-pay | Admitting: Family Medicine

## 2016-04-03 NOTE — Progress Notes (Signed)
Subjective:    Patient ID: Chad [name], male    DOB: [birth year], 83 y.o.   MRN: [id number]  03/14/2016  Follow-up (hospital pnuemonia)   HPI This 83 y.o. male presents for TRANSITION INTO CARE for recent admission for community acquired pneumonia following suffering rib fractures multiple.  He is doing well. Denies fever/chills/sweats.  Denies cough, SOB, hemoptysis. Pain from rib fractures is decreasing. Reports improving energy level and good appetite with good fluid intake. Completed abx course.  Would like to continue Miralax. Cannot get up any sputum.  Here is the details of discharge summary:  Date of Admission: 03/02/2016                      Date of Discharge: 03/03/2016  Admitting Physician: Carney Living, MD  Primary Care Provider: Nilda Simmer, MD Consultants: none  Indication for Hospitalization: CAP  Discharge Diagnoses/Problem List:  CAP Rib fracture 3, left AKI Hyponatremia Hypertension GERD  Disposition: home  Discharge Condition: stable  Discharge Exam:  General: elderly man laying in bed in NAD HEENT: MMM, EOMI, PERRLA, OP erythematous without evidence of exudates, no LAD, neck full ROM. Slight asymmetry noted  Cardiovascular: RRR. 3/6 holosystolic murmur.  Respiratory: no increased work of breathing, bilateral lung bases with crackles  Gastrointestinal: soft, non-tender, non-distended, +BS MSK: no edema or cyanosis, TTP of thoracic wall (L>R) Neuro: CN2-12 in tact. 5/5 strength bilaterally in upper and lower extremities. Sensation in tact throughout  Brief Hospital Course:  Patient is a 83 year old male who presented with persistent and worsening cough over the past few days. Symptoms seemed to have first developed after sustaining a fall about 4 weeks back. After this fall he had significant bruising over his left thoracic wall and severe pain with deep inspiration. Recently he had noticed some development of hemoptysis over the days  leading up to admission. He denied any fever or chills but had had increased sputum production.   In the ED he had been noted to have a slight AKI, normal lactic acid, and normal white blood cell count. Chest x-ray showed a paced disease at the bases bilaterally, due to his history of trauma a CTA was performed to rule out substantial pleural hemorrhage. CTA showed bilateral middle and lower lobe pneumonia, bronchiectasis/emphysema, and 6-8th left rib fractures. He was provided one dose of ceftriaxone and azithromycin, and was admitted and placed under observation.  Overnight patient had no new issues. Patient was not requiring any supplemental oxygen. He looked well and was comfortable with discharge. Patient was discharged with a prescription for azithromycin and instructions to continue incentive spirometry over the next 5 days.  Issues for Follow Up:  1. F/u completion of abx 2. F/u hemoptysis; deemed most likely secondary to rib fracture/lung contusion and should be self-limited. May require additional workup if it persists 3. Consider repeat BMP to monitor kidney function and hyponatremia 4. Monitor blood pressure   Review of Systems  Constitutional: Negative for activity change, appetite change, chills, diaphoresis, fatigue and fever.  Respiratory: Negative for cough and shortness of breath.   Cardiovascular: Negative for chest pain, palpitations and leg swelling.  Gastrointestinal: Negative for abdominal pain, diarrhea, nausea and vomiting.  Endocrine: Negative for cold intolerance, heat intolerance, polydipsia, polyphagia and polyuria.  Skin: Negative for color change, rash and wound.  Neurological: Negative for dizziness, tremors, seizures, syncope, facial asymmetry, speech difficulty, weakness, light-headedness, numbness and headaches.  Psychiatric/Behavioral: Negative for dysphoric mood and sleep disturbance. The patient  is not nervous/anxious.     Past Medical History:    Diagnosis Date  . Abnormal EKG    ST changes with normal coronaries by cath 05/06/12.  . Arthritis    "hands" (01/12/2013); feet (podiatry consultation)  . Atrial flutter (HCC)    a. Slow ~100bpm when in 2:1; dx 04/2012. b. Placed on Xarelto.  c. s/p ablation 07-08-2012 by Dr Johney Frame  . Atrial tachycardia (HCC)   . Carotid artery calcification    By CXR - dopplers 05/06/12 without obvious evidence of significant ICA stenosis >40%  . Colon polyps 06/26/2005  . COPD (chronic obstructive pulmonary disease) (HCC)   . Diverticulosis of colon (without mention of hemorrhage)   . Dysrhythmia   . Gastritis, chronic    Pt denies bleeding 04/2012. EGD 2012 reportedly normal.  . GERD (gastroesophageal reflux disease)   . Hypertension   . Hyponatremia    04/2012 r/t diuretic.  . IBS (irritable bowel syndrome)   . Internal and external hemorrhoids without complication   . Macular degeneration of left eye   . 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 Pneumonia    "couple times" (01/12/2013)  . Shortness of breath dyspnea    walking distances   Past Surgical History:  Procedure Laterality Date  . ATRIAL FLUTTER ABLATION  07-08-2012   of typical atrial flutter by Dr Johney Frame  . ATRIAL FLUTTER ABLATION N/A 07/08/2012   Procedure: ATRIAL FLUTTER ABLATION;  Surgeon: Hillis Range, MD;  Location: [facility] CATH LAB;  Service: Cardiovascular;  Laterality: N/A;  . CARDIAC CATHETERIZATION  04/2012  . COLONOSCOPY  08/29/10   diverticulosis, internal hemorrhoids  . ESOPHAGOGASTRODUODENOSCOPY  09/20/10   normal  . EYE SURGERY  01/08/2014   Cataract resection L.    [name] HEMORRHOID SURGERY  1968  . LEFT HEART CATHETERIZATION WITH CORONARY ANGIOGRAM N/A 05/06/2012   Procedure: LEFT HEART CATHETERIZATION WITH CORONARY ANGIOGRAM;  Surgeon: Peter M Swaziland, MD;  Location: [facility] CATH LAB;  Service: Cardiovascular;  Laterality: N/A;  . 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 TRIGGER FINGER RELEASE Right 1970's   "2 fingers"  . WART  FULGURATION Left 09/15/2015   Procedure: EXCISIONal biospy of left peri anual and anual canal mass;  Surgeon: Gaynelle Adu, MD;  Location: [facility] ORS;  Service: General;  Laterality: Left;   Allergies  Allergen Reactions  . Dyazide [Hydrochlorothiazide W-Triamterene] Other (See Comments)    Caused hyponatremia 04/2012   Current Outpatient Prescriptions  Medication Sig Dispense Refill  . cholecalciferol (VITAMIN D) 1000 units tablet Take 2,000 Units by mouth daily.     . clidinium-chlordiazePOXIDE (LIBRAX) 5-2.5 MG capsule Take 1 capsule by mouth 2 (two) times daily. (Patient taking differently: Take 1 capsule by mouth 3 (three) times daily before meals. ) 60 capsule 0  . cyanocobalamin 1000 MCG tablet Take 1,000 mcg by mouth daily.    [name] diltiazem (CARDIZEM) 30 MG tablet Take 1 tablet (30 mg total) by mouth every 6 (six) hours as needed. For palpitations. 30 tablet 11  . docusate sodium (COLACE) 100 MG capsule Take 100 mg by mouth daily as needed for mild constipation.    . fluticasone (FLONASE) 50 MCG/ACT nasal spray Place 2 sprays into both nostrils daily. 48 g 3  . gabapentin (NEURONTIN) 300 MG capsule Take 3 capsules at bedtime. 270 capsule 3  . lisinopril (PRINIVIL,ZESTRIL) 10 MG tablet Take 1 tablet (10 mg total) by mouth daily. 90 tablet 3  . multivitamin-lutein (OCUVITE-LUTEIN) CAPS capsule Take  1 capsule by mouth daily.    [name] omeprazole (PRILOSEC) 20 MG capsule Take 1 capsule (20 mg total) by mouth daily. Take one hour before breakfast 90 capsule 3  . azithromycin (ZITHROMAX) 250 MG tablet Take 1 tablet (250 mg total) by mouth daily. (Patient not taking: Reported on 03/14/2016) 3 tablet 0  . polyethylene glycol powder (GLYCOLAX/MIRALAX) powder Take 17 g by mouth 2 (two) times daily as needed. 3350 g 1   No current facility-administered medications for this visit.    Social History   Social History  . Marital status: Divorced    Spouse name: N/A  . Number of children: 2  . Years of  education: 16   Occupational History  . Retired    Social History Main Topics  . Smoking status: Former Smoker    Packs/day: 0.50    Years: 20.00    Types: Cigarettes, Pipe, Cigars    Quit date: 01/08/1966  . Smokeless tobacco: Never Used     Comment:    . Alcohol use 0.0 oz/week     Comment: 01/12/2013 "did drink 2-3 12 oz beers a day; stopped drinking in 04/2012"  . Drug use: No  . Sexual activity: No   Other Topics Concern  . Not on file   Social History Narrative   Marital status: divorced; lives with ex-wife; not dating in 2017.      Children:  2 sons, 2 grandsons, 1 granddaughter; no gg.      Lives: with ex-wife in house.      Employment: retired first time age 83; retired in 1997.  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      Tobacco: quit smoking 1997; smoked for 27 years.      Alcohol: quit 2004      Exercise:  Walking daily short distances.       ADLs:  NO assistant devices; has cane if needs it; drives; pays bill; grocery shopping by wife; wife cleans house and does laundry.     Advanced Directives:  +LIVING WILL; +FULL CODE.  HCPOA: oldest son Garon Leinonen Sr.)      Family History  Problem Relation Age of Onset  . Heart disease Brother   . Throat cancer Brother   . Other Mother     UNSURE  . Other Father     UNSURE  . Emphysema Sister   . Emphysema Sister   . Other Brother     UNSURE  . Diabetes         Objective:    BP (!) 162/88   Pulse (!) 118   Temp 97.9 F (36.6 C) (Oral)   Resp 18   Ht 6\' 2"  (1.88 m)   Wt 176 lb 12.8 oz (80.2 kg)   SpO2 96%   BMI 22.70 kg/m  Physical Exam  Constitutional: He is oriented to person, place, and time. He appears well-developed and well-nourished. No distress.  HENT:  Head: Normocephalic and atraumatic.  Right Ear: External ear normal.  Left Ear: External ear normal.  Nose: Nose normal.  Mouth/Throat: Oropharynx is clear and moist.  Eyes: Conjunctivae and EOM are normal. Pupils are equal,  round, and reactive to light.  Neck: Normal range of motion. Neck supple. Carotid bruit is not present. No thyromegaly present.  Cardiovascular: Normal rate, regular rhythm, normal heart sounds and intact distal pulses.  Exam reveals no gallop and no friction rub.   No murmur heard. Pulmonary/Chest: Effort normal and breath sounds  normal. He has no wheezes. He has no rales.  Abdominal: Soft. Bowel sounds are normal. He exhibits no distension and no mass. There is no tenderness. There is no rebound and no guarding.  Lymphadenopathy:    He has no cervical adenopathy.  Neurological: He is alert and oriented to person, place, and time. No cranial nerve deficit.  Skin: Skin is warm and dry. No rash noted. He is not diaphoretic.  Psychiatric: He has a normal mood and affect. His behavior is normal.  Nursing note and vitals reviewed.       Assessment & Plan:   1. Pneumonia of both lungs due to infectious organism, unspecified part of lung   2. Closed fracture of multiple ribs of left side with routine healing, subsequent encounter   3. Essential hypertension, benign   4. Acute renal failure with acute cortical necrosis (HCC)   5. Hemoptysis    -doing well; completed abx course; no recurrent hemoptysis; pain is improving. -obtain labs. -increase activity as tolerated. -continue incentive spirometry   Orders Placed This Encounter  Procedures  . CBC with Differential/Platelet  . Comprehensive metabolic panel   Meds ordered this encounter  Medications  . polyethylene glycol powder (GLYCOLAX/MIRALAX) powder    Sig: Take 17 g by mouth 2 (two) times daily as needed.    Dispense:  3350 g    Refill:  1    Return in about 2 months (around 05/14/2016).   Chonda Baney Paulita Fujita, M.D. Primary Care at [facility] [address] [place], [place]  [address] [phone] phone [phone] fax

## 2016-04-13 DIAGNOSIS — H40013 Open angle with borderline findings, low risk, bilateral: Secondary | ICD-10-CM | POA: Diagnosis not present

## 2016-04-15 ENCOUNTER — Other Ambulatory Visit: Payer: Self-pay | Admitting: Family Medicine

## 2016-04-15 DIAGNOSIS — R202 Paresthesia of skin: Secondary | ICD-10-CM

## 2016-04-15 DIAGNOSIS — K2100 Gastro-esophageal reflux disease with esophagitis, without bleeding: Secondary | ICD-10-CM

## 2016-04-20 ENCOUNTER — Encounter: Payer: Self-pay | Admitting: Internal Medicine

## 2016-04-20 ENCOUNTER — Ambulatory Visit (INDEPENDENT_AMBULATORY_CARE_PROVIDER_SITE_OTHER): Payer: Medicare Other | Admitting: Internal Medicine

## 2016-04-20 VITALS — BP 142/86 | HR 76 | Temp 97.6°F | Ht 74.0 in | Wt 174.1 lb

## 2016-04-20 DIAGNOSIS — Z0000 Encounter for general adult medical examination without abnormal findings: Secondary | ICD-10-CM | POA: Insufficient documentation

## 2016-04-20 DIAGNOSIS — N320 Bladder-neck obstruction: Secondary | ICD-10-CM

## 2016-04-20 DIAGNOSIS — E785 Hyperlipidemia, unspecified: Secondary | ICD-10-CM

## 2016-04-20 DIAGNOSIS — I10 Essential (primary) hypertension: Secondary | ICD-10-CM

## 2016-04-20 DIAGNOSIS — I350 Nonrheumatic aortic (valve) stenosis: Secondary | ICD-10-CM | POA: Diagnosis not present

## 2016-04-20 DIAGNOSIS — N400 Enlarged prostate without lower urinary tract symptoms: Secondary | ICD-10-CM | POA: Diagnosis not present

## 2016-04-20 DIAGNOSIS — N171 Acute kidney failure with acute cortical necrosis: Secondary | ICD-10-CM | POA: Diagnosis not present

## 2016-04-20 DIAGNOSIS — R079 Chest pain, unspecified: Secondary | ICD-10-CM

## 2016-04-20 NOTE — Progress Notes (Signed)
Pre visit review using our clinic review tool, if applicable. No additional management support is needed unless otherwise documented below in the visit note. 

## 2016-04-20 NOTE — Assessment & Plan Note (Signed)
PSA

## 2016-04-20 NOTE — Assessment & Plan Note (Signed)
EKG

## 2016-04-20 NOTE — Assessment & Plan Note (Signed)
Labs

## 2016-04-20 NOTE — Assessment & Plan Note (Signed)
Here for medicare wellness/physical  Diet: heart healthy  Physical activity: not sedentary  Depression/mood screen: negative  Hearing: intact to whispered voice  Visual acuity: grossly normal w/glasses, performs annual eye exam  ADLs: capable  Fall risk: low to moderate  Home safety: good  Cognitive evaluation: intact to orientation, naming, recall and repetition  EOL planning: adv directives, full code/ I agree  I have personally reviewed and have noted  1. The patient's medical, surgical and social history  2. Their use of alcohol, tobacco or illicit drugs  3. Their current medications and supplements  4. The patient's functional ability including ADL's, fall risks, home safety risks and hearing or visual impairment.  5. Diet and physical activities  6. Evidence for depression or mood disorders 7. The roster of all physicians providing medical care to patient - is listed in the Snapshot section of the chart and reviewed today.    Today patient counseled on age appropriate routine health concerns for screening and prevention, each reviewed and up to date or declined. Immunizations reviewed and up to date or declined. Labs ordered and reviewed. Risk factors for depression reviewed and negative. Hearing function and visual acuity are intact. ADLs screened and addressed as needed. Functional ability and level of safety reviewed and appropriate. Education, counseling and referrals performed based on assessed risks today. Patient provided with a copy of personalized plan for preventive services.   Start Tai-Chi or chair yoga

## 2016-04-20 NOTE — Patient Instructions (Signed)
Start Tai-Chi or chair yoga [facility] well w/Jill in 12 mo

## 2016-04-20 NOTE — Progress Notes (Signed)
Subjective:  Patient ID: Chad [name], male    DOB: 05-09-1931  Age: 83 y.o. MRN: [id number]  CC: No chief complaint on file.   HPI Chad [name] presents for a [facility] well. He had a CP episode 4 wks ago  Outpatient Medications Prior to Visit  Medication Sig Dispense Refill  . cholecalciferol (VITAMIN D) 1000 units tablet Take 2,000 Units by mouth daily.     . clidinium-chlordiazePOXIDE (LIBRAX) 5-2.5 MG capsule Take 1 capsule by mouth 2 (two) times daily. (Patient taking differently: Take 1 capsule by mouth 3 (three) times daily before meals. ) 60 capsule 0  . cyanocobalamin 1000 MCG tablet Take 1,000 mcg by mouth daily.    [name] diltiazem (CARDIZEM) 30 MG tablet Take 1 tablet (30 mg total) by mouth every 6 (six) hours as needed. For palpitations. 30 tablet 11  . docusate sodium (COLACE) 100 MG capsule Take 100 mg by mouth daily as needed for mild constipation.    . fluticasone (FLONASE) 50 MCG/ACT nasal spray Place 2 sprays into both nostrils daily. 48 g 3  . gabapentin (NEURONTIN) 300 MG capsule Take 3 capsules at bedtime. 270 capsule 3  . lisinopril (PRINIVIL,ZESTRIL) 10 MG tablet Take 1 tablet (10 mg total) by mouth daily. 90 tablet 3  . multivitamin-lutein (OCUVITE-LUTEIN) CAPS capsule Take 1 capsule by mouth daily.    [name] omeprazole (PRILOSEC) 20 MG capsule TAKE 1 CAPSULE DAILY ONE HOUR BEFORE BREAKFAST 90 capsule 3  . polyethylene glycol powder (GLYCOLAX/MIRALAX) powder Take 17 g by mouth 2 (two) times daily as needed. 3350 g 1  . azithromycin (ZITHROMAX) 250 MG tablet Take 1 tablet (250 mg total) by mouth daily. (Patient not taking: Reported on 03/14/2016) 3 tablet 0   No facility-administered medications prior to visit.     ROS Review of Systems  Constitutional: Negative for appetite change, fatigue and unexpected weight change.  HENT: Negative for congestion, nosebleeds, sneezing, sore throat and trouble swallowing.   Eyes: Negative for itching and visual disturbance.    Respiratory: Negative for cough.   Cardiovascular: Negative for chest pain, palpitations and leg swelling.  Gastrointestinal: Negative for abdominal distention, blood in stool, diarrhea and nausea.  Genitourinary: Negative for frequency and hematuria.  Musculoskeletal: Positive for arthralgias. Negative for back pain, gait problem, joint swelling and neck pain.  Skin: Negative for rash.  Neurological: Negative for dizziness, tremors, speech difficulty and weakness.  Psychiatric/Behavioral: Negative for agitation, dysphoric mood, sleep disturbance and suicidal ideas. The patient is not nervous/anxious.     Objective:  BP (!) 142/86 (BP Location: Left Arm, Patient Position: Sitting, Cuff Size: Normal)   Pulse 76   Temp 97.6 F (36.4 C) (Oral)   Ht 6\' 2"  (1.88 m)   Wt 174 lb 1.3 oz (79 kg)   SpO2 92%   BMI 22.35 kg/m   BP Readings from Last 3 Encounters:  04/20/16 (!) 142/86  03/14/16 (!) 162/88  03/03/16 (!) 160/76    Wt Readings from Last 3 Encounters:  04/20/16 174 lb 1.3 oz (79 kg)  03/14/16 176 lb 12.8 oz (80.2 kg)  02/22/16 177 lb 12.8 oz (80.6 kg)    Physical Exam  Constitutional: He is oriented to person, place, and time. He appears well-developed. No distress.  NAD  HENT:  Mouth/Throat: Oropharynx is clear and moist.  Eyes: Conjunctivae are normal. Pupils are equal, round, and reactive to light.  Neck: Normal range of motion. No JVD present. No thyromegaly present.  Cardiovascular: Normal  rate, regular rhythm, normal heart sounds and intact distal pulses.  Exam reveals no gallop and no friction rub.   No murmur heard. Pulmonary/Chest: Effort normal and breath sounds normal. No respiratory distress. He has no wheezes. He has no rales. He exhibits no tenderness.  Abdominal: Soft. Bowel sounds are normal. He exhibits no distension and no mass. There is no tenderness. There is no rebound and no guarding.  Musculoskeletal: Normal range of motion. He exhibits no edema or  tenderness.  Lymphadenopathy:    He has no cervical adenopathy.  Neurological: He is alert and oriented to person, place, and time. He has normal reflexes. No cranial nerve deficit. He exhibits normal muscle tone. He displays a negative Romberg sign. Coordination and gait normal.  Skin: Skin is warm and dry. No rash noted.  Psychiatric: He has a normal mood and affect. His behavior is normal. Judgment and thought content normal.  ataxic a little Prostate R>L   Procedure: EKG Indication: chest pain Impression: LAFB. No acute changes.  Lab Results  Component Value Date   WBC CANCELED 03/14/2016   HGB 10.5 (L) 03/03/2016   HCT CANCELED 03/14/2016   PLT CANCELED 03/14/2016   GLUCOSE 90 03/14/2016   CHOL 169 01/13/2013   TRIG 57 01/13/2013   HDL 72 01/13/2013   LDLCALC 86 01/13/2013   ALT 39 03/14/2016   AST 43 (H) 03/14/2016   NA 137 03/14/2016   K 5.0 03/14/2016   CL 96 03/14/2016   CREATININE 1.09 03/14/2016   BUN 11 03/14/2016   CO2 22 03/14/2016   TSH 3.158 01/12/2013   PSA 1.25 02/04/2012   INR 1.05 01/13/2013   HGBA1C 5.6 01/12/2013    Dg Chest 2 View  Result Date: 03/02/2016 CLINICAL DATA:  Hemoptysis for 1 day EXAM: CHEST  2 VIEW COMPARISON:  02/22/2016 FINDINGS: Indistinct opacities at the bases, best seen laterally. No Kerley lines, effusion, or pneumothorax. Chronic hyperinflation in this patient with history of COPD. Remote lateral left eighth rib fracture. Stable biapical pleural thickening. Extensive arterial calcification. IMPRESSION: 1. Hazy opacities at the bases could be pneumonia, atelectasis, or alveolar hemorrhage. 2. COPD. Electronically Signed   By: Monte Fantasia M.D.   On: 03/02/2016 11:55   Ct Angio Chest Pe W Or Wo Contrast  Result Date: 03/02/2016 CLINICAL DATA:  83 year old male with hemoptysis. Cough since yesterday. Intermittent shortness of breath. Initial encounter. EXAM: CT ANGIOGRAPHY CHEST WITH CONTRAST TECHNIQUE: Multidetector CT imaging  of the chest was performed using the standard protocol during bolus administration of intravenous contrast. Multiplanar CT image reconstructions and MIPs were obtained to evaluate the vascular anatomy. CONTRAST:  100 mL Isovue 370 COMPARISON:  Chest radiographs 1148 hours today and earlier. CT Abdomen and Pelvis 11/22/2015. FINDINGS: Cardiovascular: Good contrast bolus timing in the pulmonary arterial tree. No focal filling defect identified in the pulmonary arteries to suggest acute pulmonary embolism. Calcified aortic and coronary artery atherosclerosis. No pericardial effusion. Negative visible aorta otherwise. Mediastinum/Nodes: No mediastinal lymphadenopathy. Mild, reactive appearing bilateral hilar lymph nodes. Lungs/Pleura: Globular 2 cm focus of retained secretions in the trachea at the thoracic inlet (series 5, image 27). Minimal additional retained secretions along the more distal right lateral wall of the trachea. Other Major airways are patent. Confluent bilateral lower lobe and right middle lobe peribronchial and dependent ground-glass opacity, nearing consolidation in the right lung. Superimposed small bilateral layering pleural effusions. Superimposed right costophrenic angle bronchiectasis versus paraseptal emphysema. The upper lobes are spared. There is occasional upper  lobe centrilobular emphysema. Upper Abdomen: Negative visualized liver, gallbladder, spleen, pancreas, adrenal glands, kidneys, and bowel in the upper abdomen. Musculoskeletal: Minimally to mildly displaced lateral left sixth, seventh, and eighth rib fractures are new since November and appear acute. No other No acute osseous abnormality identified. Review of the MIP images confirms the above findings. IMPRESSION: 1.  No evidence of acute pulmonary embolus. 2. Right greater than left lower lobe and right middle lobe pneumonia. Small layering bilateral pleural effusions. Superimposed right costophrenic angle bronchiectasis versus  emphysema. 3. Acute appearing left lateral sixth through eighth rib fractures. 4. Two cm globular focus of retained secretions in the trachea at the thoracic inlet. 5. Calcified aortic and coronary artery atherosclerosis. Electronically Signed   By: Genevie Ann M.D.   On: 03/02/2016 14:24    Assessment & Plan:   Diagnoses and all orders for this visit:  Chest pain, unspecified type -     EKG 12-Lead   I have discontinued Mr. Antunes azithromycin. I am also having him maintain his lisinopril, gabapentin, fluticasone, multivitamin-lutein, diltiazem, cholecalciferol, cyanocobalamin, clidinium-chlordiazePOXIDE, docusate sodium, polyethylene glycol powder, and omeprazole.  No orders of the defined types were placed in this encounter.    Follow-up: No Follow-up on file.  Walker Kehr, MD

## 2016-04-24 ENCOUNTER — Other Ambulatory Visit (INDEPENDENT_AMBULATORY_CARE_PROVIDER_SITE_OTHER): Payer: Medicare Other

## 2016-04-24 DIAGNOSIS — R079 Chest pain, unspecified: Secondary | ICD-10-CM | POA: Diagnosis not present

## 2016-04-24 DIAGNOSIS — E875 Hyperkalemia: Secondary | ICD-10-CM

## 2016-04-24 DIAGNOSIS — Z0000 Encounter for general adult medical examination without abnormal findings: Secondary | ICD-10-CM

## 2016-04-24 DIAGNOSIS — N171 Acute kidney failure with acute cortical necrosis: Secondary | ICD-10-CM

## 2016-04-24 DIAGNOSIS — R7989 Other specified abnormal findings of blood chemistry: Secondary | ICD-10-CM

## 2016-04-24 DIAGNOSIS — I350 Nonrheumatic aortic (valve) stenosis: Secondary | ICD-10-CM | POA: Diagnosis not present

## 2016-04-24 DIAGNOSIS — E785 Hyperlipidemia, unspecified: Secondary | ICD-10-CM | POA: Diagnosis not present

## 2016-04-24 DIAGNOSIS — N320 Bladder-neck obstruction: Secondary | ICD-10-CM | POA: Diagnosis not present

## 2016-04-24 DIAGNOSIS — I10 Essential (primary) hypertension: Secondary | ICD-10-CM

## 2016-04-24 LAB — CBC WITH DIFFERENTIAL/PLATELET
BASOS ABS: 0.1 10*3/uL (ref 0.0–0.1)
BASOS PCT: 1.2 % (ref 0.0–3.0)
Eosinophils Absolute: 0.2 10*3/uL (ref 0.0–0.7)
Eosinophils Relative: 3.2 % (ref 0.0–5.0)
HEMATOCRIT: 39.7 % (ref 39.0–52.0)
Hemoglobin: 13.2 g/dL (ref 13.0–17.0)
Lymphocytes Relative: 20.9 % (ref 12.0–46.0)
Lymphs Abs: 1.5 10*3/uL (ref 0.7–4.0)
MCHC: 33.3 g/dL (ref 30.0–36.0)
MCV: 85.9 fl (ref 78.0–100.0)
MONOS PCT: 9.4 % (ref 3.0–12.0)
Monocytes Absolute: 0.7 10*3/uL (ref 0.1–1.0)
NEUTROS ABS: 4.7 10*3/uL (ref 1.4–7.7)
Neutrophils Relative %: 65.3 % (ref 43.0–77.0)
PLATELETS: 265 10*3/uL (ref 150.0–400.0)
RBC: 4.61 Mil/uL (ref 4.22–5.81)
RDW: 15.1 % (ref 11.5–15.5)
WBC: 7.2 10*3/uL (ref 4.0–10.5)

## 2016-04-24 LAB — HEPATIC FUNCTION PANEL
ALT: 11 U/L (ref 0–53)
AST: 19 U/L (ref 0–37)
Albumin: 3.5 g/dL (ref 3.5–5.2)
Alkaline Phosphatase: 69 U/L (ref 39–117)
BILIRUBIN DIRECT: 0.1 mg/dL (ref 0.0–0.3)
BILIRUBIN TOTAL: 0.5 mg/dL (ref 0.2–1.2)
Total Protein: 6.4 g/dL (ref 6.0–8.3)

## 2016-04-24 LAB — URINALYSIS, ROUTINE W REFLEX MICROSCOPIC
Bilirubin Urine: NEGATIVE
HGB URINE DIPSTICK: NEGATIVE
Ketones, ur: NEGATIVE
NITRITE: NEGATIVE
SPECIFIC GRAVITY, URINE: 1.01 (ref 1.000–1.030)
Total Protein, Urine: NEGATIVE
URINE GLUCOSE: NEGATIVE
Urobilinogen, UA: 0.2 (ref 0.0–1.0)
pH: 7.5 (ref 5.0–8.0)

## 2016-04-24 LAB — LIPID PANEL
CHOLESTEROL: 213 mg/dL — AB (ref 0–200)
HDL: 39 mg/dL — AB (ref >39.00)
LDL CALC: 148 mg/dL — AB (ref 0–99)
NONHDL: 173.89
TRIGLYCERIDES: 130 mg/dL (ref 0.0–149.0)
Total CHOL/HDL Ratio: 5
VLDL: 26 mg/dL (ref 0.0–40.0)

## 2016-04-24 LAB — BASIC METABOLIC PANEL
BUN: 12 mg/dL (ref 6–23)
CALCIUM: 9.1 mg/dL (ref 8.4–10.5)
CHLORIDE: 103 meq/L (ref 96–112)
CO2: 30 mEq/L (ref 19–32)
CREATININE: 1.28 mg/dL (ref 0.40–1.50)
GFR: 57.05 mL/min — AB (ref >60.00)
Glucose, Bld: 87 mg/dL (ref 70–99)
Potassium: 5.2 mEq/L — ABNORMAL HIGH (ref 3.5–5.1)
Sodium: 138 mEq/L (ref 135–145)

## 2016-04-24 LAB — PSA: PSA: 1.13 ng/mL (ref 0.10–4.00)

## 2016-04-24 LAB — TSH: TSH: 5.33 u[IU]/mL — ABNORMAL HIGH (ref 0.35–4.50)

## 2016-04-26 ENCOUNTER — Telehealth: Payer: Self-pay | Admitting: Internal Medicine

## 2016-04-26 DIAGNOSIS — H353221 Exudative age-related macular degeneration, left eye, with active choroidal neovascularization: Secondary | ICD-10-CM | POA: Diagnosis not present

## 2016-04-26 NOTE — Telephone Encounter (Signed)
Please call pt back with lab results 

## 2016-04-27 NOTE — Telephone Encounter (Signed)
See lab note.  

## 2016-05-11 DIAGNOSIS — Z23 Encounter for immunization: Secondary | ICD-10-CM | POA: Diagnosis not present

## 2016-05-11 DIAGNOSIS — I10 Essential (primary) hypertension: Secondary | ICD-10-CM | POA: Diagnosis not present

## 2016-05-11 DIAGNOSIS — S01111A Laceration without foreign body of right eyelid and periocular area, initial encounter: Secondary | ICD-10-CM | POA: Diagnosis not present

## 2016-05-11 DIAGNOSIS — R51 Headache: Secondary | ICD-10-CM | POA: Diagnosis not present

## 2016-05-18 ENCOUNTER — Encounter: Payer: Self-pay | Admitting: Internal Medicine

## 2016-05-18 ENCOUNTER — Ambulatory Visit (INDEPENDENT_AMBULATORY_CARE_PROVIDER_SITE_OTHER): Payer: Medicare Other | Admitting: Internal Medicine

## 2016-05-18 DIAGNOSIS — R946 Abnormal results of thyroid function studies: Secondary | ICD-10-CM | POA: Diagnosis not present

## 2016-05-18 DIAGNOSIS — I10 Essential (primary) hypertension: Secondary | ICD-10-CM

## 2016-05-18 DIAGNOSIS — Z4802 Encounter for removal of sutures: Secondary | ICD-10-CM

## 2016-05-18 DIAGNOSIS — S01111S Laceration without foreign body of right eyelid and periocular area, sequela: Secondary | ICD-10-CM | POA: Diagnosis not present

## 2016-05-18 DIAGNOSIS — R7989 Other specified abnormal findings of blood chemistry: Secondary | ICD-10-CM | POA: Insufficient documentation

## 2016-05-18 NOTE — Assessment & Plan Note (Signed)
3 sutures were removed

## 2016-05-18 NOTE — Assessment & Plan Note (Addendum)
Cardizem, Lisinopril

## 2016-05-18 NOTE — Progress Notes (Signed)
Subjective:  Patient ID: Chad [name], male    DOB: February 15, 1931  Age: 83 y.o. MRN: [id number]  CC: No chief complaint on file.   HPI [name] presents for R eybrow laceration on 5/4 in [place] and abn TSH and HTN  f/u  Outpatient Medications Prior to Visit  Medication Sig Dispense Refill  . cholecalciferol (VITAMIN D) 1000 units tablet Take 2,000 Units by mouth daily.     . clidinium-chlordiazePOXIDE (LIBRAX) 5-2.5 MG capsule Take 1 capsule by mouth 2 (two) times daily. (Patient taking differently: Take 1 capsule by mouth 3 (three) times daily before meals. ) 60 capsule 0  . cyanocobalamin 1000 MCG tablet Take 1,000 mcg by mouth daily.    [name] diltiazem (CARDIZEM) 30 MG tablet Take 1 tablet (30 mg total) by mouth every 6 (six) hours as needed. For palpitations. 30 tablet 11  . docusate sodium (COLACE) 100 MG capsule Take 100 mg by mouth daily as needed for mild constipation.    . fluticasone (FLONASE) 50 MCG/ACT nasal spray Place 2 sprays into both nostrils daily. 48 g 3  . gabapentin (NEURONTIN) 300 MG capsule Take 3 capsules at bedtime. 270 capsule 3  . lisinopril (PRINIVIL,ZESTRIL) 10 MG tablet Take 1 tablet (10 mg total) by mouth daily. 90 tablet 3  . multivitamin-lutein (OCUVITE-LUTEIN) CAPS capsule Take 1 capsule by mouth daily.    [name] omeprazole (PRILOSEC) 20 MG capsule TAKE 1 CAPSULE DAILY ONE HOUR BEFORE BREAKFAST 90 capsule 3  . polyethylene glycol powder (GLYCOLAX/MIRALAX) powder Take 17 g by mouth 2 (two) times daily as needed. 3350 g 1   No facility-administered medications prior to visit.     ROS Review of Systems  Constitutional: Negative for appetite change, fatigue and unexpected weight change.  HENT: Negative for congestion, nosebleeds, sneezing, sore throat and trouble swallowing.   Eyes: Negative for itching and visual disturbance.  Respiratory: Negative for cough.   Cardiovascular: Negative for chest pain, palpitations and leg swelling.  Gastrointestinal:  Negative for abdominal distention, blood in stool, diarrhea and nausea.  Genitourinary: Negative for frequency and hematuria.  Musculoskeletal: Negative for back pain, gait problem, joint swelling and neck pain.  Skin: Positive for wound. Negative for rash.  Neurological: Negative for dizziness, tremors, speech difficulty and weakness.  Psychiatric/Behavioral: Negative for agitation, dysphoric mood and sleep disturbance. The patient is not nervous/anxious.     R eybrow laceration    Objective:  BP 122/74 (BP Location: Left Arm, Patient Position: Sitting, Cuff Size: Normal)   Pulse 84   Temp 97.7 F (36.5 C) (Oral)   Ht 6\' 2"  (1.88 m)   Wt 174 lb 1.9 oz (79 kg)   SpO2 95%   BMI 22.36 kg/m   BP Readings from Last 3 Encounters:  05/18/16 122/74  04/20/16 (!) 142/86  03/14/16 (!) 162/88    Wt Readings from Last 3 Encounters:  05/18/16 174 lb 1.9 oz (79 kg)  04/20/16 174 lb 1.3 oz (79 kg)  03/14/16 176 lb 12.8 oz (80.2 kg)    Physical Exam  Constitutional: He is oriented to person, place, and time. He appears well-developed. No distress.  NAD  HENT:  Mouth/Throat: Oropharynx is clear and moist.  Eyes: Conjunctivae are normal. Pupils are equal, round, and reactive to light.  Neck: Normal range of motion. No JVD present. No thyromegaly present.  Cardiovascular: Normal rate, regular rhythm, normal heart sounds and intact distal pulses.  Exam reveals no gallop and no friction rub.  No murmur heard. Pulmonary/Chest: Effort normal and breath sounds normal. No respiratory distress. He has no wheezes. He has no rales. He exhibits no tenderness.  Abdominal: Soft. Bowel sounds are normal. He exhibits no distension and no mass. There is no tenderness. There is no rebound and no guarding.  Musculoskeletal: Normal range of motion. He exhibits no edema or tenderness.  Lymphadenopathy:    He has no cervical adenopathy.  Neurological: He is alert and oriented to person, place, and time.  He has normal reflexes. No cranial nerve deficit. He exhibits normal muscle tone. He displays a negative Romberg sign. Coordination and gait normal.  Skin: Skin is warm and dry. No rash noted.  Psychiatric: He has a normal mood and affect. His behavior is normal. Judgment and thought content normal.    R eybrow laceration   Lab Results  Component Value Date   WBC 7.2 04/24/2016   HGB 13.2 04/24/2016   HCT 39.7 04/24/2016   PLT 265.0 04/24/2016   GLUCOSE 87 04/24/2016   CHOL 213 (H) 04/24/2016   TRIG 130.0 04/24/2016   HDL 39.00 (L) 04/24/2016   LDLCALC 148 (H) 04/24/2016   ALT 11 04/24/2016   AST 19 04/24/2016   NA 138 04/24/2016   K 5.2 (H) 04/24/2016   CL 103 04/24/2016   CREATININE 1.28 04/24/2016   BUN 12 04/24/2016   CO2 30 04/24/2016   TSH 5.33 (H) 04/24/2016   PSA 1.13 04/24/2016   INR 1.05 01/13/2013   HGBA1C 5.6 01/12/2013    Dg Chest 2 View  Result Date: 03/02/2016 CLINICAL DATA:  Hemoptysis for 1 day EXAM: CHEST  2 VIEW COMPARISON:  02/22/2016 FINDINGS: Indistinct opacities at the bases, best seen laterally. No Kerley lines, effusion, or pneumothorax. Chronic hyperinflation in this patient with history of COPD. Remote lateral left eighth rib fracture. Stable biapical pleural thickening. Extensive arterial calcification. IMPRESSION: 1. Hazy opacities at the bases could be pneumonia, atelectasis, or alveolar hemorrhage. 2. COPD. Electronically Signed   By: Monte Fantasia M.D.   On: 03/02/2016 11:55   Ct Angio Chest Pe W Or Wo Contrast  Result Date: 03/02/2016 CLINICAL DATA:  83 year old male with hemoptysis. Cough since yesterday. Intermittent shortness of breath. Initial encounter. EXAM: CT ANGIOGRAPHY CHEST WITH CONTRAST TECHNIQUE: Multidetector CT imaging of the chest was performed using the standard protocol during bolus administration of intravenous contrast. Multiplanar CT image reconstructions and MIPs were obtained to evaluate the vascular anatomy. CONTRAST:   100 mL Isovue 370 COMPARISON:  Chest radiographs 1148 hours today and earlier. CT Abdomen and Pelvis 11/22/2015. FINDINGS: Cardiovascular: Good contrast bolus timing in the pulmonary arterial tree. No focal filling defect identified in the pulmonary arteries to suggest acute pulmonary embolism. Calcified aortic and coronary artery atherosclerosis. No pericardial effusion. Negative visible aorta otherwise. Mediastinum/Nodes: No mediastinal lymphadenopathy. Mild, reactive appearing bilateral hilar lymph nodes. Lungs/Pleura: Globular 2 cm focus of retained secretions in the trachea at the thoracic inlet (series 5, image 27). Minimal additional retained secretions along the more distal right lateral wall of the trachea. Other Major airways are patent. Confluent bilateral lower lobe and right middle lobe peribronchial and dependent ground-glass opacity, nearing consolidation in the right lung. Superimposed small bilateral layering pleural effusions. Superimposed right costophrenic angle bronchiectasis versus paraseptal emphysema. The upper lobes are spared. There is occasional upper lobe centrilobular emphysema. Upper Abdomen: Negative visualized liver, gallbladder, spleen, pancreas, adrenal glands, kidneys, and bowel in the upper abdomen. Musculoskeletal: Minimally to mildly displaced lateral left sixth, seventh,  and eighth rib fractures are new since November and appear acute. No other No acute osseous abnormality identified. Review of the MIP images confirms the above findings. IMPRESSION: 1.  No evidence of acute pulmonary embolus. 2. Right greater than left lower lobe and right middle lobe pneumonia. Small layering bilateral pleural effusions. Superimposed right costophrenic angle bronchiectasis versus emphysema. 3. Acute appearing left lateral sixth through eighth rib fractures. 4. Two cm globular focus of retained secretions in the trachea at the thoracic inlet. 5. Calcified aortic and coronary artery  atherosclerosis. Electronically Signed   By: Genevie Ann M.D.   On: 03/02/2016 14:24    Assessment & Plan:   There are no diagnoses linked to this encounter. I am having Mr. Kilner maintain his lisinopril, gabapentin, fluticasone, multivitamin-lutein, diltiazem, cholecalciferol, cyanocobalamin, clidinium-chlordiazePOXIDE, docusate sodium, polyethylene glycol powder, and omeprazole.  No orders of the defined types were placed in this encounter.    Follow-up: No Follow-up on file.  Walker Kehr, MD

## 2016-05-18 NOTE — Assessment & Plan Note (Signed)
TSH, FT4 ordered

## 2016-05-23 DIAGNOSIS — Z8719 Personal history of other diseases of the digestive system: Secondary | ICD-10-CM | POA: Diagnosis not present

## 2016-05-30 ENCOUNTER — Other Ambulatory Visit (INDEPENDENT_AMBULATORY_CARE_PROVIDER_SITE_OTHER): Payer: Medicare Other

## 2016-05-30 ENCOUNTER — Other Ambulatory Visit: Payer: Self-pay | Admitting: Internal Medicine

## 2016-05-30 DIAGNOSIS — R7989 Other specified abnormal findings of blood chemistry: Secondary | ICD-10-CM

## 2016-05-30 DIAGNOSIS — E875 Hyperkalemia: Secondary | ICD-10-CM

## 2016-05-30 DIAGNOSIS — R946 Abnormal results of thyroid function studies: Secondary | ICD-10-CM | POA: Diagnosis not present

## 2016-05-30 LAB — BASIC METABOLIC PANEL
BUN: 13 mg/dL (ref 6–23)
CHLORIDE: 100 meq/L (ref 96–112)
CO2: 28 mEq/L (ref 19–32)
Calcium: 8.9 mg/dL (ref 8.4–10.5)
Creatinine, Ser: 1.27 mg/dL (ref 0.40–1.50)
GFR: 57.55 mL/min — ABNORMAL LOW (ref >60.00)
GLUCOSE: 92 mg/dL (ref 70–99)
POTASSIUM: 5.4 meq/L — AB (ref 3.5–5.1)
Sodium: 133 mEq/L — ABNORMAL LOW (ref 135–145)

## 2016-05-30 LAB — T4, FREE: Free T4: 0.8 ng/dL (ref 0.60–1.60)

## 2016-05-30 LAB — TSH: TSH: 4.31 u[IU]/mL (ref 0.35–4.50)

## 2016-05-30 MED ORDER — LISINOPRIL 10 MG PO TABS
5.0000 mg | ORAL_TABLET | Freq: Every day | ORAL | 3 refills | Status: DC
Start: 2016-05-30 — End: 2016-07-13

## 2016-06-05 ENCOUNTER — Encounter: Payer: Medicare Other | Admitting: Family Medicine

## 2016-06-06 ENCOUNTER — Encounter (HOSPITAL_COMMUNITY): Payer: Self-pay | Admitting: Emergency Medicine

## 2016-06-06 ENCOUNTER — Other Ambulatory Visit: Payer: Self-pay | Admitting: Family

## 2016-06-06 ENCOUNTER — Emergency Department (HOSPITAL_COMMUNITY)
Admission: EM | Admit: 2016-06-06 | Discharge: 2016-06-06 | Disposition: A | Discharge status: Home / Self Care | Payer: Medicare Other | Attending: Emergency Medicine | Admitting: Emergency Medicine

## 2016-06-06 ENCOUNTER — Emergency Department (HOSPITAL_COMMUNITY): Payer: Medicare Other

## 2016-06-06 DIAGNOSIS — Z79899 Other long term (current) drug therapy: Secondary | ICD-10-CM | POA: Diagnosis not present

## 2016-06-06 DIAGNOSIS — Z955 Presence of coronary angioplasty implant and graft: Secondary | ICD-10-CM | POA: Insufficient documentation

## 2016-06-06 DIAGNOSIS — J449 Chronic obstructive pulmonary disease, unspecified: Secondary | ICD-10-CM | POA: Diagnosis not present

## 2016-06-06 DIAGNOSIS — Z87891 Personal history of nicotine dependence: Secondary | ICD-10-CM | POA: Diagnosis not present

## 2016-06-06 DIAGNOSIS — R05 Cough: Secondary | ICD-10-CM | POA: Diagnosis not present

## 2016-06-06 DIAGNOSIS — I10 Essential (primary) hypertension: Secondary | ICD-10-CM | POA: Diagnosis not present

## 2016-06-06 DIAGNOSIS — R042 Hemoptysis: Secondary | ICD-10-CM | POA: Insufficient documentation

## 2016-06-06 DIAGNOSIS — J189 Pneumonia, unspecified organism: Secondary | ICD-10-CM | POA: Insufficient documentation

## 2016-06-06 LAB — TROPONIN I: Troponin I: <0.03 ng/mL (ref <0.03)

## 2016-06-06 LAB — CBC WITH DIFFERENTIAL/PLATELET
BASOS PCT: 0 %
Basophils Absolute: 0 10*3/uL (ref 0.0–0.1)
Eosinophils Absolute: 0.1 10*3/uL (ref 0.0–0.7)
Eosinophils Relative: 0 %
HEMATOCRIT: 34.7 % — AB (ref 39.0–52.0)
HEMOGLOBIN: 11.5 g/dL — AB (ref 13.0–17.0)
LYMPHS ABS: 1 10*3/uL (ref 0.7–4.0)
LYMPHS PCT: 7 %
MCH: 28.1 pg (ref 26.0–34.0)
MCHC: 33.1 g/dL (ref 30.0–36.0)
MCV: 84.8 fL (ref 78.0–100.0)
MONO ABS: 0.9 10*3/uL (ref 0.1–1.0)
MONOS PCT: 7 %
NEUTROS ABS: 11.9 10*3/uL — AB (ref 1.7–7.7)
NEUTROS PCT: 86 %
Platelets: 192 10*3/uL (ref 150–400)
RBC: 4.09 MIL/uL — ABNORMAL LOW (ref 4.22–5.81)
RDW: 14 % (ref 11.5–15.5)
WBC: 13.9 10*3/uL — ABNORMAL HIGH (ref 4.0–10.5)

## 2016-06-06 LAB — COMPREHENSIVE METABOLIC PANEL
ALBUMIN: 3.1 g/dL — AB (ref 3.5–5.0)
ALK PHOS: 54 U/L (ref 38–126)
ALT: 13 U/L — ABNORMAL LOW (ref 17–63)
ANION GAP: 6 (ref 5–15)
AST: 25 U/L (ref 15–41)
BUN: 19 mg/dL (ref 6–20)
CALCIUM: 8.5 mg/dL — AB (ref 8.9–10.3)
CO2: 26 mmol/L (ref 22–32)
Chloride: 97 mmol/L — ABNORMAL LOW (ref 101–111)
Creatinine, Ser: 1.41 mg/dL — ABNORMAL HIGH (ref 0.61–1.24)
GFR calc non Af Amer: 44 mL/min — ABNORMAL LOW (ref >60)
GFR, EST AFRICAN AMERICAN: 52 mL/min — AB (ref >60)
GLUCOSE: 90 mg/dL (ref 65–99)
POTASSIUM: 5.1 mmol/L (ref 3.5–5.1)
SODIUM: 129 mmol/L — AB (ref 135–145)
Total Bilirubin: 0.6 mg/dL (ref 0.3–1.2)
Total Protein: 6.3 g/dL — ABNORMAL LOW (ref 6.5–8.1)

## 2016-06-06 LAB — PROTIME-INR
INR: 1.09
PROTHROMBIN TIME: 14.1 s (ref 11.4–15.2)

## 2016-06-06 MED ORDER — LEVOFLOXACIN 500 MG PO TABS: 500.0000 mg | ORAL_TABLET | Freq: Every day | ORAL | 0 refills | Status: DC

## 2016-06-06 MED ORDER — LEVOFLOXACIN IN D5W 750 MG/150ML IV SOLN
750.0000 mg | Freq: Once | INTRAVENOUS | Status: AC
  Administered 2016-06-06: 750 mg via INTRAVENOUS
  Filled 2016-06-06: qty 150

## 2016-06-06 MED ORDER — SODIUM CHLORIDE 0.9 % IV BOLUS (SEPSIS)
1000.0000 mL | Freq: Once | INTRAVENOUS | Status: AC
  Administered 2016-06-06: 1000 mL via INTRAVENOUS

## 2016-06-06 NOTE — ED Provider Notes (Signed)
[facility] DEPT Provider Note   CSN: [id number] Arrival date & time: 06/06/16  0347     History   Chief Complaint Chief Complaint  Patient presents with  . Cough    HPI Chad [name] is a 83 y.o. male.  HPI  Patient presents with concern of cough. Comfortable present for 3 or 4 [name], Chad [name] precipitant. Does note a history of prior pneumonia, Chad today denies fever, chest pain, abdominal pain, lightheadedness, syncope. Since the cough began, no [name] alleviating or exacerbating factors. He does have a history of cancer, 19 years ago. He is also a former smoker, quit during that cancer episode.   Past Medical History:  Diagnosis Date  . Abnormal EKG    ST changes with normal coronaries by cath 05/06/12.  . Arthritis    "hands" (01/12/2013); feet (podiatry consultation)  . Atrial flutter ([place])    a. Slow ~100bpm when in 2:1; dx 04/2012. b. Placed on Xarelto.  c. s/p ablation 07-08-2012 by Dr Rayann Heman  . Atrial tachycardia ([place])   . Carotid artery calcification    By CXR - dopplers 05/06/12 without obvious evidence of significant ICA stenosis >40%  . Colon polyps 06/26/2005  . COPD (chronic obstructive pulmonary disease) ([place])   . Diverticulosis of colon (without mention of hemorrhage)   . Dysrhythmia   . Gastritis, chronic    Pt denies bleeding 04/2012. EGD 2012 reportedly normal.  . GERD (gastroesophageal reflux disease)   . Hypertension   . Hyponatremia    04/2012 r/t diuretic.  . IBS (irritable bowel syndrome)   . Internal and external hemorrhoids without complication   . Macular degeneration of left eye   . 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 Pneumonia    "couple times" (01/12/2013)  . Shortness of breath dyspnea    walking distances    Patient Active Problem List   Diagnosis Date Noted  . Eyebrow laceration, right, sequela 05/18/2016  . Abnormal TSH 05/18/2016  . Well adult exam 04/20/2016  . Enlarged  prostate 04/20/2016  . Community acquired pneumonia   . Pneumonia of both lungs due to infectious organism 03/02/2016  . 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 Anal lesion 06/07/2015  . Gas 01/18/2015  . Aortic stenosis 06/08/2013  . Unsteadiness 02/11/2013  . Ectopic atrial tachycardia ([place]) 01/12/2013  . Atrial tachycardia ([place]) 01/12/2013  . Atrial flutter ([place]) 05/06/2012  . Aortic valve sclerosis 05/06/2012  . Acute renal failure ([place]) 05/05/2012  . 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 Anemia 07/27/2010  . Essential hypertension 11/15/2008  . COPD 11/15/2008  . GERD 11/15/2008  . IBS 11/15/2008  . Midsternal chest pain 11/15/2008    Past Surgical History:  Procedure Laterality Date  . ATRIAL FLUTTER ABLATION  07-08-2012   of typical atrial flutter by Dr Rayann Heman  . ATRIAL FLUTTER ABLATION N/A 07/08/2012   Procedure: ATRIAL FLUTTER ABLATION;  Surgeon: Thompson Grayer, MD;  Location: [facility] C[facility] LAB;  Service: Cardiovascular;  Laterality: N/A;  . CARDIAC CATHETERIZATION  04/2012  . COLONOSCOPY  08/29/10   diverticulosis, internal hemorrhoids  . ESOPHAGOGASTRODUODENOSCOPY  09/20/10   normal  . EYE SURGERY  01/08/2014   Cataract resection L.    [name] [place]  . LEFT HEART CATHETERIZATION WITH CORONARY ANGIOGRAM N/A 05/06/2012   Procedure: LEFT HEART CATHETERIZATION WITH CORONARY ANGIOGRAM;  Surgeon: Peter M Martinique, MD;  Location: [facility] CATH LAB;  Service: Cardiovascular;  Laterality: N/A;  . 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 TRIGGER FINGER RELEASE Right 1970's   "2 fingers"  . WART FULGURATION Left 09/15/2015   Procedure: EXCISIONal biospy of left peri anual and anual canal mass;  Surgeon: Greer Pickerel, MD;  Location: [facility] ORS;  Service: General;  Laterality: Left;       Home Medications    Prior to Admission medications   Medication Sig Start Date End Date Taking? Authorizing  Provider  cholecalciferol (VITAMIN D) 1000 units tablet Take 2,000 Units by mouth daily.     [provider]  clidinium-chlordiazePOXIDE (LIBRAX) 5-2.5 MG capsule Take 1 capsule by mouth 2 (two) times daily. Patient taking differently: Take 1 capsule by mouth 3 (three) times daily before meals.  01/24/16   Golden Circle, FNP  cyanocobalamin 1000 MCG tablet Take 1,000 mcg by mouth daily.    [provider]  diltiazem (CARDIZEM) 30 MG tablet Take 1 tablet (30 mg total) by mouth every 6 (six) hours as needed. For palpitations. 10/20/15   Lelon Perla, MD  docusate sodium (COLACE) 100 MG capsule Take 100 mg by mouth daily as needed for mild constipation.    [provider]  fluticasone (FLONASE) 50 MCG/ACT nasal spray Place 2 sprays into both nostrils daily. 05/31/15   Wardell Honour, MD  gabapentin (NEURONTIN) 300 MG capsule Take 3 capsules at bedtime. 05/31/15   Wardell Honour, MD  lisinopril (PRINIVIL,ZESTRIL) 10 MG tablet Take 0.5 tablets (5 mg total) by mouth daily. 05/30/16   Plotnikov, Evie Lacks, MD  multivitamin-lutein (OCUVITE-LUTEIN) CAPS capsule Take 1 capsule by mouth daily.    [provider]  omeprazole (PRILOSEC) 20 MG capsule TAKE 1 CAPSULE DAILY ONE HOUR BEFORE BREAKFAST 04/17/16   Wardell Honour, MD  polyethylene glycol powder (GLYCOLAX/MIRALAX) powder Take 17 g by mouth 2 (two) times daily as needed. 03/14/16   Wardell Honour, MD    Family History Family History  Problem Relation Age of Onset  . Heart disease Brother   . Throat cancer Brother   . Other Mother        UNSURE  . Other Father        UNSURE  . Emphysema Sister   . Emphysema Sister   . Other Brother        UNSURE  . Diabetes Unknown     Social History Social History  Substance Use Topics  . Smoking status: Former Smoker    Packs/day: 0.50    Years: 20.00    Types: Cigarettes, Pipe, Cigars    Quit date: 01/08/1966  . Smokeless tobacco: Never Used     Comment:      . Alcohol use 0.0 oz/week     Comment: 01/12/2013 "did drink 2-3 12 oz beers a day; stopped drinking in 04/2012"     Allergies   Dyazide [hydrochlorothiazide w-triamterene]   Review of Systems Review of Systems  Constitutional:       Per HPI, otherwise negative  HENT:       Per HPI, otherwise negative  Respiratory:       Per HPI, otherwise negative  Cardiovascular:       Per HPI, otherwise negative  Gastrointestinal: Negative for vomiting.  Endocrine:       Negative aside from HPI  Genitourinary:       Neg aside from HPI   Musculoskeletal:  Per HPI, otherwise negative  Skin: Negative.   Neurological: Negative for syncope.     Physical Exam Updated Vital Signs BP (!) 146/76   Pulse 65   Temp 98.2 F (36.8 C) (Oral)   Resp 17   SpO2 98%   Physical Exam  Constitutional: He is oriented to person, place, and time. He appears well-developed. No distress.  HENT:  Head: Normocephalic and atraumatic.  Eyes: Conjunctivae and EOM are normal.  Cardiovascular: Normal rate and regular rhythm.   Pulmonary/Chest: Effort normal. No stridor. No respiratory distress.  Abdominal: He exhibits no distension.  Musculoskeletal: He exhibits no edema.  Neurological: He is alert and oriented to person, place, and time.  Skin: Skin is warm and dry.  Psychiatric: He has a normal mood and affect.  Nursing note and vitals reviewed.    ED Treatments / Results  Labs (all labs ordered are listed, Chad only abnormal results are displayed) Labs Reviewed  CBC WITH DIFFERENTIAL/PLATELET - Abnormal; Notable for the following:       Result Value   WBC 13.9 (*)    RBC 4.09 (*)    Hemoglobin 11.5 (*)    HCT 34.7 (*)    Neutro Abs 11.9 (*)    All other components within normal limits  COMPREHENSIVE METABOLIC PANEL  TROPONIN I  PROTIME-INR     Radiology Dg Chest 2 View  Result Date: 06/06/2016 CLINICAL DATA:  Cough EXAM: CHEST  2 VIEW COMPARISON:  Chest CT 03/02/2016 FINDINGS:  Airspace disease at both bases, similar to prior. Suspect this is recurrent rather than persistent disease. Lung bases were [name] 12/13/2014. Possible trace pleural effusions. No pulmonary edema. Borderline cardiomegaly. Stable aortic and hilar contours. Viable calcification is from extensive subclavian atherosclerosis and pleural calcification. Healing lateral left eighth rib fracture. IMPRESSION: Airspace disease in both bases; the same pattern was seen 03/02/2016. This could reflect infection, aspiration, or alveolar hemorrhage in this patient with history of hemoptysis. Followup PA and lateral chest X-ray is recommended in 3-4 weeks to ensure resolution. Electronically Signed   By: Monte Fantasia M.D.   On: 06/06/2016 09:57    Procedures Procedures (including critical care time)  Medications Ordered in ED Medications  levofloxacin (LEVAQUIN) IVPB 750 mg (750 mg Intravenous New Bag/Given 06/06/16 1122)  sodium chloride 0.9 % bolus 1,000 mL (1,000 mLs Intravenous New Bag/Given 06/06/16 1122)    Notes notable for fall, pneumonia, 2-1/2 months ago. Initial Impression / Assessment and Plan / ED Course  I have reviewed the triage vital signs and the nursing notes.  Pertinent labs & imaging results that were available during my care of the patient were reviewed by me and considered in my medical decision making (see chart for details).  Update: Patient aware of all findings per  Update: Patient is tolerated initial antibiotics well, fluids well, and we discussed all findings at length. With evidence for recurrent pneumonia, patient has tolerated antibiotics, has a preference for discharge with close outpatient follow-up. Given the absence of abnormal vital signs, is otherwise reassuring labs aside from mild leukocytosis, mild creatinine increase (addressed with fluid bolus), he is appropriate for close outpatient follow-up.   Final Clinical Impressions(s) / ED Diagnoses  Community acquired  pneumonia Hemoptysis   Carmin Muskrat, MD 06/06/16 1354

## 2016-06-06 NOTE — ED Triage Notes (Signed)
Pt sts cough x 3 days and pain with cough; pt sts dark sputum this am with cough

## 2016-06-06 NOTE — ED Notes (Signed)
Hooked patient up to monitor patient is resting waiting provider

## 2016-06-06 NOTE — ED Notes (Signed)
Gave patient a urinal patient is resting

## 2016-06-08 ENCOUNTER — Other Ambulatory Visit: Payer: Self-pay

## 2016-06-08 MED ORDER — CILIDINIUM-CHLORDIAZEPOXIDE 2.5-5 MG PO CAPS: 1.0000 | ORAL_CAPSULE | Freq: Two times a day (BID) | ORAL | 0 refills | Status: DC

## 2016-06-08 NOTE — Telephone Encounter (Signed)
Rx sent 

## 2016-06-08 NOTE — Telephone Encounter (Signed)
Pt walked in regarding this medicine clidinium-chlordiazePOXIDE (LIBRAX) 5-2.5 MG capsule  Would like it sent to [facility] on [address]

## 2016-06-12 ENCOUNTER — Ambulatory Visit (INDEPENDENT_AMBULATORY_CARE_PROVIDER_SITE_OTHER): Payer: Medicare Other | Admitting: Internal Medicine

## 2016-06-12 ENCOUNTER — Encounter: Payer: Self-pay | Admitting: Internal Medicine

## 2016-06-12 DIAGNOSIS — R7989 Other specified abnormal findings of blood chemistry: Secondary | ICD-10-CM

## 2016-06-12 DIAGNOSIS — J189 Pneumonia, unspecified organism: Secondary | ICD-10-CM | POA: Diagnosis not present

## 2016-06-12 DIAGNOSIS — R042 Hemoptysis: Secondary | ICD-10-CM | POA: Diagnosis not present

## 2016-06-12 DIAGNOSIS — IMO0002 Reserved for concepts with insufficient information to code with codable children: Secondary | ICD-10-CM | POA: Insufficient documentation

## 2016-06-12 MED ORDER — PROMETHAZINE-CODEINE 6.25-10 MG/5ML PO SYRP: 5.0000 mL | ORAL_SOLUTION | ORAL | 0 refills | Status: DC | PRN

## 2016-06-12 NOTE — Progress Notes (Signed)
Subjective:  Patient ID: Chad [name], male    DOB: 1932-01-02  Age: 83 y.o. MRN: [id number]  CC: No chief complaint on file.   HPI PHEONIX CLINKSCALE presents for CAP dx'd on 5/30 by ER (CXR w/B airspace disease) - given Levaquin F/u hemoptysis - resolved  Outpatient Medications Prior to Visit  Medication Sig Dispense Refill  . calcium carbonate (OS-CAL) 1250 (500 Ca) MG chewable tablet Chew 1 tablet by mouth daily as needed for heartburn.    . cholecalciferol (VITAMIN D) 1000 units tablet Take 2,000 Units by mouth once a week.     . clidinium-chlordiazePOXIDE (LIBRAX) 5-2.5 MG capsule Take 1 capsule by mouth 2 (two) times daily. 60 capsule 0  . cyanocobalamin 1000 MCG tablet Take 1,000 mcg by mouth daily as needed (supplement).     [name] diltiazem (CARDIZEM) 30 MG tablet Take 1 tablet (30 mg total) by mouth every 6 (six) hours as needed. For palpitations. 30 tablet 11  . docusate sodium (COLACE) 100 MG capsule Take 100 mg by mouth daily.    . fluticasone (FLONASE) 50 MCG/ACT nasal spray Place 2 sprays into both nostrils daily. 48 g 3  . gabapentin (NEURONTIN) 300 MG capsule Take 3 capsules at bedtime. 270 capsule 3  . levofloxacin (LEVAQUIN) 500 MG tablet Take 1 tablet (500 mg total) by mouth daily. 7 tablet 0  . lisinopril (PRINIVIL,ZESTRIL) 10 MG tablet Take 0.5 tablets (5 mg total) by mouth daily. 90 tablet 3  . multivitamin-lutein (OCUVITE-LUTEIN) CAPS capsule Take 1 capsule by mouth once a week.     [name] omeprazole (PRILOSEC) 20 MG capsule TAKE 1 CAPSULE DAILY ONE HOUR BEFORE BREAKFAST 90 capsule 3  . polyethylene glycol powder (GLYCOLAX/MIRALAX) powder Take 17 g by mouth 2 (two) times daily as needed. 3350 g 1   No facility-administered medications prior to visit.     ROS Review of Systems  Constitutional: Negative for appetite change, fatigue and unexpected weight change.  HENT: Negative for congestion, nosebleeds, sneezing, sore throat and trouble swallowing.   Eyes: Negative  for itching and visual disturbance.  Respiratory: Positive for cough.   Cardiovascular: Negative for chest pain, palpitations and leg swelling.  Gastrointestinal: Negative for abdominal distention, blood in stool, diarrhea and nausea.  Genitourinary: Negative for frequency and hematuria.  Musculoskeletal: Negative for back pain, gait problem, joint swelling and neck pain.  Skin: Negative for rash.  Neurological: Negative for dizziness, tremors, speech difficulty and weakness.  Psychiatric/Behavioral: Negative for agitation, dysphoric mood and sleep disturbance. The patient is not nervous/anxious.     Objective:  BP 132/80 (BP Location: Left Arm, Patient Position: Sitting, Cuff Size: Normal)   Pulse 85   Temp 97.7 F (36.5 C) (Oral)   Ht 6\' 2"  (1.88 m)   Wt 175 lb (79.4 kg)   SpO2 98%   BMI 22.47 kg/m   BP Readings from Last 3 Encounters:  06/12/16 132/80  06/06/16 (!) 158/84  05/18/16 122/74    Wt Readings from Last 3 Encounters:  06/12/16 175 lb (79.4 kg)  05/18/16 174 lb 1.9 oz (79 kg)  04/20/16 174 lb 1.3 oz (79 kg)    Physical Exam  Constitutional: He is oriented to person, place, and time. He appears well-developed. No distress.  NAD  HENT:  Mouth/Throat: Oropharynx is clear and moist.  Eyes: Conjunctivae are normal. Pupils are equal, round, and reactive to light.  Neck: Normal range of motion. No JVD present. No thyromegaly present.  Cardiovascular: Normal rate,  regular rhythm, normal heart sounds and intact distal pulses.  Exam reveals no gallop and no friction rub.   No murmur heard. Pulmonary/Chest: Effort normal and breath sounds normal. No respiratory distress. He has no wheezes. He has no rales. He exhibits no tenderness.  Abdominal: Soft. Bowel sounds are normal. He exhibits no distension and no mass. There is no tenderness. There is no rebound and no guarding.  Musculoskeletal: Normal range of motion. He exhibits no edema or tenderness.  Lymphadenopathy:     He has no cervical adenopathy.  Neurological: He is alert and oriented to person, place, and time. He has normal reflexes. No cranial nerve deficit. He exhibits normal muscle tone. He displays a negative Romberg sign. Coordination and gait normal.  Skin: Skin is warm and dry. No rash noted.  Psychiatric: He has a normal mood and affect. His behavior is normal. Judgment and thought content normal.    Lab Results  Component Value Date   WBC 13.9 (H) 06/06/2016   HGB 11.5 (L) 06/06/2016   HCT 34.7 (L) 06/06/2016   PLT 192 06/06/2016   GLUCOSE 90 06/06/2016   CHOL 213 (H) 04/24/2016   TRIG 130.0 04/24/2016   HDL 39.00 (L) 04/24/2016   LDLCALC 148 (H) 04/24/2016   ALT 13 (L) 06/06/2016   AST 25 06/06/2016   NA 129 (L) 06/06/2016   K 5.1 06/06/2016   CL 97 (L) 06/06/2016   CREATININE 1.41 (H) 06/06/2016   BUN 19 06/06/2016   CO2 26 06/06/2016   TSH 4.31 05/30/2016   PSA 1.13 04/24/2016   INR 1.09 06/06/2016   HGBA1C 5.6 01/12/2013    Dg Chest 2 View  Result Date: 06/06/2016 CLINICAL DATA:  Cough EXAM: CHEST  2 VIEW COMPARISON:  Chest CT 03/02/2016 FINDINGS: Airspace disease at both bases, similar to prior. Suspect this is recurrent rather than persistent disease. Lung bases were clear 12/13/2014. Possible trace pleural effusions. No pulmonary edema. Borderline cardiomegaly. Stable aortic and hilar contours. Viable calcification is from extensive subclavian atherosclerosis and pleural calcification. Healing lateral left eighth rib fracture. IMPRESSION: Airspace disease in both bases; the same pattern was seen 03/02/2016. This could reflect infection, aspiration, or alveolar hemorrhage in this patient with history of hemoptysis. Followup PA and lateral chest X-ray is recommended in 3-4 weeks to ensure resolution. Electronically Signed   By: Monte Fantasia M.D.   On: 06/06/2016 09:57    Assessment & Plan:   There are no diagnoses linked to this encounter. I am having Mr. Mangold  maintain his gabapentin, fluticasone, multivitamin-lutein, diltiazem, cholecalciferol, cyanocobalamin, polyethylene glycol powder, omeprazole, lisinopril, calcium carbonate, docusate sodium, levofloxacin, and clidinium-chlordiazePOXIDE.  No orders of the defined types were placed in this encounter.    Follow-up: No Follow-up on file.  Walker Kehr, MD

## 2016-06-12 NOTE — Assessment & Plan Note (Signed)
?  etiol  No signs of vasculitis Repeat labs

## 2016-06-12 NOTE — Assessment & Plan Note (Signed)
CAP dx'd on 5/30 by ER (CXR w/B airspace disease) - given Levaquin  Repeat CXR

## 2016-06-12 NOTE — Assessment & Plan Note (Signed)
CAP related vs other Pulm ref if re-occurred Prom-cod syr CXR in 3-4 wks

## 2016-06-14 ENCOUNTER — Ambulatory Visit (HOSPITAL_COMMUNITY): Payer: Medicare Other

## 2016-06-21 DIAGNOSIS — H353221 Exudative age-related macular degeneration, left eye, with active choroidal neovascularization: Secondary | ICD-10-CM | POA: Diagnosis not present

## 2016-07-04 ENCOUNTER — Ambulatory Visit
Admission: RE | Admit: 2016-07-04 | Discharge: 2016-07-04 | Disposition: A | Discharge status: Home / Self Care | Payer: Medicare Other | Source: Ambulatory Visit | Attending: Internal Medicine | Admitting: Internal Medicine

## 2016-07-04 ENCOUNTER — Other Ambulatory Visit (INDEPENDENT_AMBULATORY_CARE_PROVIDER_SITE_OTHER): Payer: Medicare Other

## 2016-07-04 DIAGNOSIS — R042 Hemoptysis: Secondary | ICD-10-CM | POA: Diagnosis not present

## 2016-07-04 DIAGNOSIS — J189 Pneumonia, unspecified organism: Secondary | ICD-10-CM | POA: Diagnosis not present

## 2016-07-04 DIAGNOSIS — H16002 Unspecified corneal ulcer, left eye: Secondary | ICD-10-CM | POA: Diagnosis not present

## 2016-07-04 DIAGNOSIS — R7989 Other specified abnormal findings of blood chemistry: Secondary | ICD-10-CM | POA: Diagnosis not present

## 2016-07-04 LAB — BASIC METABOLIC PANEL
BUN: 11 mg/dL (ref 6–23)
CHLORIDE: 103 meq/L (ref 96–112)
CO2: 31 mEq/L (ref 19–32)
Calcium: 8.7 mg/dL (ref 8.4–10.5)
Creatinine, Ser: 1.27 mg/dL (ref 0.40–1.50)
GFR: 57.54 mL/min — ABNORMAL LOW (ref >60.00)
Glucose, Bld: 92 mg/dL (ref 70–99)
POTASSIUM: 4.9 meq/L (ref 3.5–5.1)
Sodium: 136 mEq/L (ref 135–145)

## 2016-07-04 LAB — CBC WITH DIFFERENTIAL/PLATELET
BASOS PCT: 1.1 % (ref 0.0–3.0)
Basophils Absolute: 0.1 10*3/uL (ref 0.0–0.1)
EOS PCT: 6.1 % — AB (ref 0.0–5.0)
Eosinophils Absolute: 0.4 10*3/uL (ref 0.0–0.7)
HEMATOCRIT: 37 % — AB (ref 39.0–52.0)
HEMOGLOBIN: 12.4 g/dL — AB (ref 13.0–17.0)
Lymphocytes Relative: 20.2 % (ref 12.0–46.0)
Lymphs Abs: 1.5 10*3/uL (ref 0.7–4.0)
MCHC: 33.6 g/dL (ref 30.0–36.0)
MCV: 86.5 fl (ref 78.0–100.0)
Monocytes Absolute: 0.8 10*3/uL (ref 0.1–1.0)
Monocytes Relative: 11.4 % (ref 3.0–12.0)
Neutro Abs: 4.4 10*3/uL (ref 1.4–7.7)
Neutrophils Relative %: 61.2 % (ref 43.0–77.0)
Platelets: 206 10*3/uL (ref 150.0–400.0)
RBC: 4.28 Mil/uL (ref 4.22–5.81)
RDW: 14.3 % (ref 11.5–15.5)
WBC: 7.2 10*3/uL (ref 4.0–10.5)

## 2016-07-09 ENCOUNTER — Telehealth: Payer: Self-pay

## 2016-07-09 ENCOUNTER — Other Ambulatory Visit: Payer: Self-pay | Admitting: Internal Medicine

## 2016-07-09 ENCOUNTER — Ambulatory Visit (INDEPENDENT_AMBULATORY_CARE_PROVIDER_SITE_OTHER)
Admission: RE | Admit: 2016-07-09 | Discharge: 2016-07-09 | Disposition: A | Discharge status: Home / Self Care | Payer: Medicare Other | Source: Ambulatory Visit | Attending: Internal Medicine | Admitting: Internal Medicine

## 2016-07-09 DIAGNOSIS — J189 Pneumonia, unspecified organism: Secondary | ICD-10-CM

## 2016-07-09 NOTE — Telephone Encounter (Signed)
X-ray called and needed a new order for chest xray in.

## 2016-07-13 ENCOUNTER — Other Ambulatory Visit: Payer: Self-pay | Admitting: Family Medicine

## 2016-07-13 NOTE — Telephone Encounter (Signed)
Refill request for lisinopril  Pt was not prescribe this medication( lisinopril) by our office.  Pt last seen on 03/14/16 for Pneumonia in which was advise to return in 2 mths  Please advise on refill request

## 2016-07-16 NOTE — Telephone Encounter (Signed)
Has established with [name].  Will forward.

## 2016-07-18 ENCOUNTER — Other Ambulatory Visit: Payer: Self-pay

## 2016-07-18 NOTE — Telephone Encounter (Signed)
Rec'd fax from [facility] for refills on Fluticasone and Gabapentin. You have never prescribed these medications, please advise.

## 2016-07-22 MED ORDER — GABAPENTIN 300 MG PO CAPS: ORAL_CAPSULE | ORAL | 3 refills | Status: DC

## 2016-07-22 MED ORDER — FLUTICASONE PROPIONATE 50 MCG/ACT NA SUSP: 2.0000 | Freq: Every day | NASAL | 3 refills | Status: DC

## 2016-07-23 NOTE — Telephone Encounter (Signed)
MD approved refills and sent rx's to mail service...Johny Chess

## 2016-07-31 DIAGNOSIS — G5762 Lesion of plantar nerve, left lower limb: Secondary | ICD-10-CM | POA: Diagnosis not present

## 2016-07-31 DIAGNOSIS — M7752 Other enthesopathy of left foot: Secondary | ICD-10-CM | POA: Diagnosis not present

## 2016-07-31 DIAGNOSIS — G5761 Lesion of plantar nerve, right lower limb: Secondary | ICD-10-CM | POA: Diagnosis not present

## 2016-07-31 DIAGNOSIS — M7751 Other enthesopathy of right foot: Secondary | ICD-10-CM | POA: Diagnosis not present

## 2016-08-06 ENCOUNTER — Other Ambulatory Visit (INDEPENDENT_AMBULATORY_CARE_PROVIDER_SITE_OTHER): Payer: Medicare Other

## 2016-08-06 DIAGNOSIS — E875 Hyperkalemia: Secondary | ICD-10-CM

## 2016-08-06 LAB — BASIC METABOLIC PANEL
BUN: 13 mg/dL (ref 6–23)
CALCIUM: 9.2 mg/dL (ref 8.4–10.5)
CO2: 29 mEq/L (ref 19–32)
Chloride: 102 mEq/L (ref 96–112)
Creatinine, Ser: 1.33 mg/dL (ref 0.40–1.50)
GFR: 54.54 mL/min — AB (ref >60.00)
Glucose, Bld: 91 mg/dL (ref 70–99)
POTASSIUM: 3.7 meq/L (ref 3.5–5.1)
Sodium: 138 mEq/L (ref 135–145)

## 2016-08-07 DIAGNOSIS — M7751 Other enthesopathy of right foot: Secondary | ICD-10-CM | POA: Diagnosis not present

## 2016-08-07 DIAGNOSIS — G5762 Lesion of plantar nerve, left lower limb: Secondary | ICD-10-CM | POA: Diagnosis not present

## 2016-08-07 DIAGNOSIS — G5761 Lesion of plantar nerve, right lower limb: Secondary | ICD-10-CM | POA: Diagnosis not present

## 2016-08-07 DIAGNOSIS — G5790 Unspecified mononeuropathy of unspecified lower limb: Secondary | ICD-10-CM | POA: Diagnosis not present

## 2016-08-07 DIAGNOSIS — M7752 Other enthesopathy of left foot: Secondary | ICD-10-CM | POA: Diagnosis not present

## 2016-08-15 DIAGNOSIS — M79671 Pain in right foot: Secondary | ICD-10-CM | POA: Diagnosis not present

## 2016-08-15 DIAGNOSIS — R634 Abnormal weight loss: Secondary | ICD-10-CM | POA: Diagnosis not present

## 2016-08-15 DIAGNOSIS — E531 Pyridoxine deficiency: Secondary | ICD-10-CM | POA: Diagnosis not present

## 2016-08-15 DIAGNOSIS — G2581 Restless legs syndrome: Secondary | ICD-10-CM | POA: Diagnosis not present

## 2016-08-15 DIAGNOSIS — E538 Deficiency of other specified B group vitamins: Secondary | ICD-10-CM | POA: Diagnosis not present

## 2016-08-15 DIAGNOSIS — G609 Hereditary and idiopathic neuropathy, unspecified: Secondary | ICD-10-CM | POA: Diagnosis not present

## 2016-08-15 DIAGNOSIS — G5601 Carpal tunnel syndrome, right upper limb: Secondary | ICD-10-CM | POA: Diagnosis not present

## 2016-08-15 DIAGNOSIS — M5417 Radiculopathy, lumbosacral region: Secondary | ICD-10-CM | POA: Diagnosis not present

## 2016-08-15 DIAGNOSIS — R202 Paresthesia of skin: Secondary | ICD-10-CM | POA: Diagnosis not present

## 2016-08-15 DIAGNOSIS — R7301 Impaired fasting glucose: Secondary | ICD-10-CM | POA: Diagnosis not present

## 2016-08-15 DIAGNOSIS — G603 Idiopathic progressive neuropathy: Secondary | ICD-10-CM | POA: Diagnosis not present

## 2016-08-16 DIAGNOSIS — H43813 Vitreous degeneration, bilateral: Secondary | ICD-10-CM | POA: Diagnosis not present

## 2016-08-16 DIAGNOSIS — H353221 Exudative age-related macular degeneration, left eye, with active choroidal neovascularization: Secondary | ICD-10-CM | POA: Diagnosis not present

## 2016-08-22 ENCOUNTER — Encounter: Payer: Self-pay | Admitting: Cardiology

## 2016-09-06 DIAGNOSIS — M5442 Lumbago with sciatica, left side: Secondary | ICD-10-CM | POA: Diagnosis not present

## 2016-09-06 DIAGNOSIS — G2581 Restless legs syndrome: Secondary | ICD-10-CM | POA: Diagnosis not present

## 2016-09-06 DIAGNOSIS — G603 Idiopathic progressive neuropathy: Secondary | ICD-10-CM | POA: Diagnosis not present

## 2016-09-06 DIAGNOSIS — G5601 Carpal tunnel syndrome, right upper limb: Secondary | ICD-10-CM | POA: Diagnosis not present

## 2016-09-06 DIAGNOSIS — R202 Paresthesia of skin: Secondary | ICD-10-CM | POA: Diagnosis not present

## 2016-09-06 DIAGNOSIS — M5441 Lumbago with sciatica, right side: Secondary | ICD-10-CM | POA: Diagnosis not present

## 2016-09-06 NOTE — Progress Notes (Signed)
HPI: FU atrial tachycardia and aortic stenosis. Patient presented in April of 2014 with dizziness and noted to be in atrial flutter. Cardiac catheterization on 05/06/12 revealed normal coronary arteries. Carotid Dopplers April 2014 showed no significant stenosis. TSH normal. Patient had atrial flutter ablation on 07/08/2012. Admitted with atrial tachycardia 1/15. Declined ablation. Treated with short acting cardizem as he had evidence of tachy brady. Last echocardiogram July 2017 showed normal LV systolic function, mild aortic stenosis with mean gradient 11 mmHg, trace aortic insufficiency and moderate biatrial enlargement. Since last seen, patient has dyspnea with more vigorous activities but not routine activities. No orthopnea, PND, pedal edema, palpitations, syncope or chest pain.  Current Outpatient Prescriptions  Medication Sig Dispense Refill  . calcium carbonate (OS-CAL) 1250 (500 Ca) MG chewable tablet Chew 1 tablet by mouth daily as needed for heartburn.    . cholecalciferol (VITAMIN D) 1000 units tablet Take 2,000 Units by mouth once a week.     . clidinium-chlordiazePOXIDE (LIBRAX) 5-2.5 MG capsule Take 1 capsule by mouth 2 (two) times daily. 60 capsule 0  . cyanocobalamin 1000 MCG tablet Take 1,000 mcg by mouth daily as needed (supplement).     [name] diltiazem (CARDIZEM) 30 MG tablet Take 1 tablet (30 mg total) by mouth every 6 (six) hours as needed. For palpitations. 30 tablet 11  . docusate sodium (COLACE) 100 MG capsule Take 100 mg by mouth daily.    . fluticasone (FLONASE) 50 MCG/ACT nasal spray Place 2 sprays into both nostrils daily. 48 g 3  . gabapentin (NEURONTIN) 300 MG capsule Take 3 capsules at bedtime. 270 capsule 3  . lisinopril (PRINIVIL,ZESTRIL) 10 MG tablet TAKE 1 TABLET DAILY 90 tablet 3  . multivitamin-lutein (OCUVITE-LUTEIN) CAPS capsule Take 1 capsule by mouth once a week.     [name] omeprazole (PRILOSEC) 20 MG capsule TAKE 1 CAPSULE DAILY ONE HOUR BEFORE BREAKFAST 90  capsule 3  . polyethylene glycol powder (GLYCOLAX/MIRALAX) powder Take 17 g by mouth 2 (two) times daily as needed. 3350 g 1  . promethazine-codeine (PHENERGAN WITH CODEINE) 6.25-10 MG/5ML syrup Take 5 mLs by mouth every 4 (four) hours as needed. 300 mL 0   No current facility-administered medications for this visit.      Past Medical History:  Diagnosis Date  . Abnormal EKG    ST changes with normal coronaries by cath 05/06/12.  . Arthritis    "hands" (01/12/2013); feet (podiatry consultation)  . Atrial flutter ([place])    a. Slow ~100bpm when in 2:1; dx 04/2012. b. Placed on Xarelto.  c. s/p ablation 07-08-2012 by Dr Rayann Heman  . Atrial tachycardia ([place])   . Carotid artery calcification    By CXR - dopplers 05/06/12 without obvious evidence of significant ICA stenosis >40%  . Colon polyps 06/26/2005  . COPD (chronic obstructive pulmonary disease) ([place])   . Diverticulosis of colon (without mention of hemorrhage)   . Dysrhythmia   . Gastritis, chronic    Pt denies bleeding 04/2012. EGD 2012 reportedly normal.  . GERD (gastroesophageal reflux disease)   . Hypertension   . Hyponatremia    04/2012 r/t diuretic.  . IBS (irritable bowel syndrome)   . Internal and external hemorrhoids without complication   . Macular degeneration of left eye   . 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 Pneumonia    "couple times" (01/12/2013)  . Shortness of breath dyspnea    walking distances    Past Surgical History:  Procedure Laterality Date  .  ATRIAL FLUTTER ABLATION  07-08-2012   of typical atrial flutter by Dr Rayann Heman  . ATRIAL FLUTTER ABLATION N/A 07/08/2012   Procedure: ATRIAL FLUTTER ABLATION;  Surgeon: Thompson Grayer, MD;  Location: [facility] CATH LAB;  Service: Cardiovascular;  Laterality: N/A;  . CARDIAC CATHETERIZATION  04/2012  . COLONOSCOPY  08/29/10   diverticulosis, internal hemorrhoids  . ESOPHAGOGASTRODUODENOSCOPY  09/20/10   normal  . EYE SURGERY  01/08/2014   Cataract resection L.    [name] [place]    . LEFT HEART CATHETERIZATION WITH CORONARY ANGIOGRAM N/A 05/06/2012   Procedure: LEFT HEART CATHETERIZATION WITH CORONARY ANGIOGRAM;  Surgeon: Peter M Martinique, MD;  Location: [facility] CATH LAB;  Service: Cardiovascular;  Laterality: N/A;  . 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 TRIGGER FINGER RELEASE Right 1970's   "2 fingers"  . WART FULGURATION Left 09/15/2015   Procedure: EXCISIONal biospy of left peri anual and anual canal mass;  Surgeon: Greer Pickerel, MD;  Location: [facility] ORS;  Service: General;  Laterality: Left;    Social History   Social History  . Marital status: Divorced    Spouse name: N/A  . Number of children: 2  . Years of education: 16   Occupational History  . Retired    Social History Main Topics  . Smoking status: Former Smoker    Packs/day: 0.50    Years: 20.00    Types: Cigarettes, Pipe, Cigars    Quit date: 01/08/1966  . Smokeless tobacco: Never Used     Comment:    . Alcohol use 0.0 oz/week     Comment: 01/12/2013 "did drink 2-3 12 oz beers a day; stopped drinking in 04/2012"  . Drug use: No  . Sexual activity: No   Other Topics Concern  . Not on file   Social History Narrative   Marital status: divorced; lives with ex-wife; not dating in 2017.      Children:  2 sons, 2 grandsons, 1 granddaughter; no gg.      Lives: with ex-wife in house.      Employment: retired first time age 76; retired in 1997.  Barista x 25 years; Actor.      Tobacco: quit smoking 1997; smoked for 27 years.      Alcohol: quit 2004      Exercise:  Walking daily short distances.       ADLs:  NO assistant devices; has cane if needs it; drives; pays bill; grocery shopping by wife; wife cleans house and does laundry.     Advanced Directives:  +LIVING WILL; +FULL CODE.  HCPOA: oldest son Ashe Gago Sr.)       Family History  Problem Relation Age of Onset  . Heart disease Brother   . Throat cancer Brother   . Other Mother        UNSURE  . Other  Father        UNSURE  . Emphysema Sister   . Emphysema Sister   . Other Brother        UNSURE  . Diabetes Unknown     ROS: no fevers or chills, productive cough, hemoptysis, dysphasia, odynophagia, melena, hematochezia, dysuria, hematuria, rash, seizure activity, orthopnea, PND, pedal edema, claudication. Remaining systems are negative.  Physical Exam: Well-developed well-nourished in no acute distress.  Skin is warm and dry.  HEENT is normal.  Neck is supple.  Chest is clear to auscultation with normal expansion.  Cardiovascular exam is regular  rate and rhythm. 2/6 systolic murmur left sternal border. S2 is not diminished.   Abdominal exam nontender or distended. No masses palpated. Extremities show no edema. neuro grossly intact  ECG- atrial tachycardia with a ventricular response of 86. Left axis deviation. personally reviewed  A/P  1 atrial tachycardia-patient is in atrial tachycardia today. However his ventricular response is 86. He is not having palpitations, dyspnea or chest pain. Given that he is asymptomatic there is no reason to proceed with cardioversion or ablation. I will arrange a 24-hour Holter monitor to make sure that his rate is controlled. We can consider intervention in the future if he develops symptoms.   2 history of mild aortic stenosis-aortic stenosis continues to not sound severe on examination. We will likely repeat echocardiogram when he returns in 1 year.  3 hypertension-blood pressure is controlled. Continue present medications.  4 history of atrial flutter-status post ablation.  Kirk Ruths, MD

## 2016-09-14 ENCOUNTER — Encounter: Payer: Self-pay | Admitting: Cardiology

## 2016-09-14 ENCOUNTER — Ambulatory Visit (INDEPENDENT_AMBULATORY_CARE_PROVIDER_SITE_OTHER): Payer: Medicare Other | Admitting: Cardiology

## 2016-09-14 VITALS — BP 120/60 | HR 86 | Ht 74.0 in | Wt 180.0 lb

## 2016-09-14 DIAGNOSIS — I350 Nonrheumatic aortic (valve) stenosis: Secondary | ICD-10-CM | POA: Diagnosis not present

## 2016-09-14 DIAGNOSIS — I471 Supraventricular tachycardia: Secondary | ICD-10-CM | POA: Diagnosis not present

## 2016-09-14 DIAGNOSIS — I10 Essential (primary) hypertension: Secondary | ICD-10-CM | POA: Diagnosis not present

## 2016-09-14 NOTE — Patient Instructions (Signed)
Medication Instructions:   NO CHANGE  Testing/Procedures:  Your physician has recommended that you wear a 24 HOUR holter monitor. Holter monitors are medical devices that record the heart's electrical activity. Doctors most often use these monitors to diagnose arrhythmias. Arrhythmias are problems with the speed or rhythm of the heartbeat. The monitor is a small, portable device. You can wear one while you do your normal daily activities. This is usually used to diagnose what is causing palpitations/syncope (passing out).    Follow-Up:  Your physician wants you to follow-up in: 6 MONTHS WITH DR CRENSHAW You will receive a reminder letter in the mail two months in advance. If you don't receive a letter, please call our office to schedule the follow-up appointment.   If you need a refill on your cardiac medications before your next appointment, please call your pharmacy.    

## 2016-09-18 ENCOUNTER — Telehealth: Payer: Self-pay | Admitting: Internal Medicine

## 2016-09-18 MED ORDER — CILIDINIUM-CHLORDIAZEPOXIDE 2.5-5 MG PO CAPS: 1.0000 | ORAL_CAPSULE | Freq: Two times a day (BID) | ORAL | 2 refills | Status: DC

## 2016-09-18 NOTE — Telephone Encounter (Signed)
Rx printed Thx 

## 2016-09-18 NOTE — Telephone Encounter (Signed)
Patient states he came into our office and he watch [place] [facility]?  Please follow up in regard.  Did notify patient that it looks like we are still waiting on an approval.

## 2016-09-18 NOTE — Telephone Encounter (Signed)
Pt came by the office with a letter from [facility] stating that they have been trying to get in touch with our office regarding a refill on  clidinium-chlordiazePOXIDE (LIBRAX) 5-2.5 MG capsule. They stated that they have not been able to get in touch with [place] and for him to contact his doctor regarding the refill. He asked if the clidinium-chlordiazePOXIDE (LIBRAX) 5-2.5 MG capsule could be sent to [facility] on [address].

## 2016-09-19 NOTE — Telephone Encounter (Signed)
Called pt no answer LMOM rx faxed to [facility]...Chad [name]

## 2016-10-01 ENCOUNTER — Ambulatory Visit (INDEPENDENT_AMBULATORY_CARE_PROVIDER_SITE_OTHER): Payer: Medicare Other

## 2016-10-01 DIAGNOSIS — I471 Supraventricular tachycardia: Secondary | ICD-10-CM | POA: Diagnosis not present

## 2016-10-15 ENCOUNTER — Ambulatory Visit (INDEPENDENT_AMBULATORY_CARE_PROVIDER_SITE_OTHER): Payer: Medicare Other | Admitting: Internal Medicine

## 2016-10-15 ENCOUNTER — Encounter: Payer: Self-pay | Admitting: Internal Medicine

## 2016-10-15 VITALS — BP 128/72 | HR 103 | Temp 97.3°F | Ht 74.0 in | Wt 185.0 lb

## 2016-10-15 DIAGNOSIS — I471 Supraventricular tachycardia: Secondary | ICD-10-CM | POA: Diagnosis not present

## 2016-10-15 DIAGNOSIS — I10 Essential (primary) hypertension: Secondary | ICD-10-CM | POA: Diagnosis not present

## 2016-10-15 DIAGNOSIS — Z23 Encounter for immunization: Secondary | ICD-10-CM | POA: Diagnosis not present

## 2016-10-15 DIAGNOSIS — R7989 Other specified abnormal findings of blood chemistry: Secondary | ICD-10-CM | POA: Diagnosis not present

## 2016-10-15 DIAGNOSIS — R2681 Unsteadiness on feet: Secondary | ICD-10-CM | POA: Diagnosis not present

## 2016-10-15 DIAGNOSIS — K580 Irritable bowel syndrome with diarrhea: Secondary | ICD-10-CM

## 2016-10-15 MED ORDER — VITAMIN D 1000 UNITS PO TABS: 1000.0000 [IU] | ORAL_TABLET | Freq: Every day | ORAL | 3 refills | Status: DC

## 2016-10-15 MED ORDER — VITAMIN D 1000 UNITS PO TABS: 2000.0000 [IU] | ORAL_TABLET | Freq: Every day | ORAL | 3 refills | Status: DC

## 2016-10-15 NOTE — Assessment & Plan Note (Signed)
Diltiazem, Lisinopril

## 2016-10-15 NOTE — Assessment & Plan Note (Signed)
Lab Results  Component Value Date   CREATININE 1.33 08/06/2016

## 2016-10-15 NOTE — Patient Instructions (Addendum)
[facility]   Gluten free trial for 4-6 weeks. OK to use gluten-free bread and gluten-free pasta.    Gluten-Free Diet for Celiac Disease, Adult The gluten-free diet includes all foods that do not contain gluten. Gluten is a protein that is found in wheat, rye, barley, and some other grains. Following the gluten-free diet is the only treatment for people with celiac disease. It helps to prevent damage to the intestines and improves or eliminates the symptoms of celiac disease. Following the gluten-free diet requires some planning. It can be challenging at first, but it gets easier with time and practice. There are more gluten-free options available today than ever before. If you need help finding gluten-free foods or if you have questions, talk with your diet and nutrition specialist (registered dietitian) or your health care provider. What do I need to know about a gluten-free diet?  All fruits, vegetables, and meats are safe to eat and do not contain gluten.  When grocery shopping, start by shopping in the produce, meat, and dairy sections. These sections are more likely to contain gluten-free foods. Then move to the aisles that contain packaged foods if you need to.  Read all food labels. Gluten is often added to foods. Always check the ingredient list and look for warnings, such as "may contain gluten."  Talk with your dietitian or health care provider before taking a gluten-free multivitamin or mineral supplement.  Be aware of gluten-free foods having contact with foods that contain gluten (cross-contamination). This can happen at home and with any processed foods. ? Talk with your health care provider or dietitian about how to reduce the risk of cross-contamination in your home. ? If you have questions about how a food is processed, ask the manufacturer. What key words help to identify gluten? Foods that list any of these key words on the label usually contain gluten:  Wheat, flour,  enriched flour, bromated flour, white flour, durum flour, graham flour, phosphated flour, self-rising flour, semolina, farina, barley (malt), rye, and oats.  Starch, dextrin, modified food starch, or cereal.  Thickening, fillers, or emulsifiers.  Malt flavoring, malt extract, or malt syrup.  Hydrolyzed vegetable protein.  In the [place] packaged foods that are gluten-free are required to be labeled "GF." These foods should be easy to identify and are safe to eat. In the [place] food companies are also required to list common food allergens, including wheat, on their labels. Recommended foods Grains  Amaranth, bean flours, 100% buckwheat flour, corn, millet, nut flours or nut meals, GF oats, quinoa, rice, sorghum, teff, rice wafers, pure cornmeal tortillas, popcorn, and hot cereals made from cornmeal. Hominy, rice, wild rice. Some Asian rice noodles or bean noodles. Arrowroot starch, corn bran, corn flour, corn germ, cornmeal, corn starch, potato flour, potato starch flour, and rice bran. Plain, brown, and sweet rice flours. Rice polish, soy flour, and tapioca starch. Vegetables  All plain fresh, frozen, and canned vegetables. Fruits  All plain fresh, frozen, canned, and dried fruits, and 100% fruit juices. Meats and other protein foods  All fresh beef, pork, poultry, fish, seafood, and eggs. Fish canned in water, oil, brine, or vegetable broth. Plain nuts and seeds, peanut butter. Some lunch meat and some frankfurters. Dried beans, dried peas, and lentils. Dairy  Fresh plain, dry, evaporated, or condensed milk. Cream, butter, sour cream, whipping cream, and most yogurts. Unprocessed cheese, most processed cheeses, some cottage cheese, some cream cheeses. Beverages  Coffee, tea, most herbal teas. Carbonated beverages and  some root beers. Wine, sake, and distilled spirits, such as gin, vodka, and whiskey. Most hard ciders. Fats and oils  Butter, margarine, vegetable oil, hydrogenated  butter, olive oil, shortening, lard, cream, and some mayonnaise. Some commercial salad dressings. Olives. Sweets and desserts  Sugar, honey, some syrups, molasses, jelly, and jam. Plain hard candy, marshmallows, and gumdrops. Pure cocoa powder. Plain chocolate. Custard and some pudding mixes. Gelatin desserts, sorbets, frozen ice pops, and sherbet. Cake, cookies, and other desserts prepared with allowed flours. Some commercial ice creams. Cornstarch, tapioca, and rice puddings. Seasoning and other foods  Some canned or frozen soups. Monosodium glutamate (MSG). Cider, rice, and wine vinegar. Baking soda and baking powder. Cream of tartar. Baking and nutritional yeast. Certain soy sauces made without wheat (ask your dietitian about specific brands that are allowed). Nuts, coconut, and chocolate. Salt, pepper, herbs, spices, flavoring extracts, imitation or artificial flavorings, natural flavorings, and food colorings. Some medicines and supplements. Some lip glosses and other cosmetics. Rice syrups. The items listed may not be a complete list. Talk with your dietitian about what dietary choices are best for you. Foods to avoid Grains  Barley, bran, bulgur, couscous, cracked wheat, [place], farro, graham, malt, matzo, semolina, wheat germ, and all wheat and rye cereals including spelt and kamut. Cereals containing malt as a flavoring, such as rice cereal. Noodles, spaghetti, macaroni, most packaged rice mixes, and all mixes containing wheat, rye, barley, or triticale. Vegetables  Most creamed vegetables and most vegetables canned in sauces. Some commercially prepared vegetables and salads. Fruits  Thickened or prepared fruits and some pie fillings. Some fruit snacks and fruit roll-ups. Meats and other protein foods  Any meat or meat alternative containing wheat, rye, barley, or gluten stabilizers. These are often marinated or packaged meats and lunch meats. Bread-containing products, such as Swiss  steak, croquettes, meatballs, and meatloaf. Most tuna canned in vegetable broth and [place] with hydrolyzed vegetable protein (HVP) injected as part of the basting. Seitan. Imitation fish. Eggs in sauces made from ingredients to avoid. Dairy  Commercial chocolate milk drinks and malted milk. Some non-dairy creamers. Any cheese product containing ingredients to avoid. Beverages  Certain cereal beverages. Beer, ale, malted milk, and some root beers. Some hard ciders. Some instant flavored coffees. Some herbal teas made with barley or with barley malt added. Fats and oils  Some commercial salad dressings. Sour cream containing modified food starch. Sweets and desserts  Some toffees. Chocolate-coated nuts (may be rolled in wheat flour) and some commercial candies and candy bars. Most cakes, cookies, donuts, pastries, and other baked goods. Some commercial ice cream. Ice cream cones. Commercially prepared mixes for cakes, cookies, and other desserts. Bread pudding and other puddings thickened with flour. Products containing brown rice syrup made with barley malt enzyme. Desserts and sweets made with malt flavoring. Seasoning and other foods  Some curry powders, some dry seasoning mixes, some gravy extracts, some meat sauces, some ketchups, some prepared mustards, and horseradish. Certain soy sauces. Malt vinegar. Bouillon and bouillon cubes that contain HVP. Some chip dips, and some chewing gum. Yeast extract. Brewer's yeast. Caramel color. Some medicines and supplements. Some lip glosses and other cosmetics. The items listed may not be a complete list. Talk with your dietitian about what dietary choices are best for you. Summary  Gluten is a protein that is found in wheat, rye, barley, and some other grains. The gluten-free diet includes all foods that do not contain gluten.  If you need help finding gluten-free  foods or if you have questions, talk with your diet and nutrition specialist (registered  dietitian) or your health care provider.  Read all food labels. Gluten is often added to foods. Always check the ingredient list and look for warnings, such as "may contain gluten." This information is not intended to replace advice given to you by your health care provider. Make sure you discuss any questions you have with your health care provider. Document Released: 12/25/2004 Document Revised: 10/10/2015 Document Reviewed: 10/10/2015 Elsevier Interactive Patient Education  2018 [employer].

## 2016-10-15 NOTE — Assessment & Plan Note (Signed)
Diltiazem

## 2016-10-15 NOTE — Progress Notes (Signed)
Subjective:  Patient ID: Chad [name], male    DOB: 1931/10/31  Age: 83 y.o. MRN: [id number]  CC: No chief complaint on file.   HPI Chad [name] presents for A tachycardia, IBS, GERD, AS f/u  Outpatient Medications Prior to Visit  Medication Sig Dispense Refill  . calcium carbonate (OS-CAL) 1250 (500 Ca) MG chewable tablet Chew 1 tablet by mouth daily as needed for heartburn.    . cholecalciferol (VITAMIN D) 1000 units tablet Take 2,000 Units by mouth once a week.     . clidinium-chlordiazePOXIDE (LIBRAX) 5-2.5 MG capsule Take 1 capsule by mouth 2 (two) times daily. 60 capsule 2  . cyanocobalamin 1000 MCG tablet Take 1,000 mcg by mouth daily as needed (supplement).     [name] diltiazem (CARDIZEM) 30 MG tablet Take 1 tablet (30 mg total) by mouth every 6 (six) hours as needed. For palpitations. 30 tablet 11  . docusate sodium (COLACE) 100 MG capsule Take 100 mg by mouth daily.    . fluticasone (FLONASE) 50 MCG/ACT nasal spray Place 2 sprays into both nostrils daily. 48 g 3  . gabapentin (NEURONTIN) 300 MG capsule Take 3 capsules at bedtime. 270 capsule 3  . lisinopril (PRINIVIL,ZESTRIL) 10 MG tablet TAKE 1 TABLET DAILY 90 tablet 3  . multivitamin-lutein (OCUVITE-LUTEIN) CAPS capsule Take 1 capsule by mouth once a week.     [name] omeprazole (PRILOSEC) 20 MG capsule TAKE 1 CAPSULE DAILY ONE HOUR BEFORE BREAKFAST 90 capsule 3  . polyethylene glycol powder (GLYCOLAX/MIRALAX) powder Take 17 g by mouth 2 (two) times daily as needed. 3350 g 1  . promethazine-codeine (PHENERGAN WITH CODEINE) 6.25-10 MG/5ML syrup Take 5 mLs by mouth every 4 (four) hours as needed. 300 mL 0   No facility-administered medications prior to visit.     ROS Review of Systems  Constitutional: Negative for appetite change, fatigue and unexpected weight change.  HENT: Negative for congestion, nosebleeds, sneezing, sore throat and trouble swallowing.   Eyes: Negative for itching and visual disturbance.  Respiratory:  Negative for cough.   Cardiovascular: Negative for chest pain, palpitations and leg swelling.  Gastrointestinal: Positive for abdominal distention and diarrhea. Negative for blood in stool and nausea.  Genitourinary: Negative for frequency and hematuria.  Musculoskeletal: Positive for gait problem. Negative for back pain, joint swelling and neck pain.  Skin: Negative for rash.  Neurological: Negative for dizziness, tremors, speech difficulty and weakness.  Psychiatric/Behavioral: Negative for agitation, dysphoric mood and sleep disturbance. The patient is not nervous/anxious.     Objective:  BP 128/72 (BP Location: Left Arm, Patient Position: Sitting, Cuff Size: Normal)   Pulse (!) 103   Temp (!) 97.3 F (36.3 C) (Oral)   Ht 6\' 2"  (1.88 m)   Wt 185 lb (83.9 kg)   SpO2 95%   BMI 23.75 kg/m   BP Readings from Last 3 Encounters:  10/15/16 128/72  09/14/16 120/60  06/12/16 132/80    Wt Readings from Last 3 Encounters:  10/15/16 185 lb (83.9 kg)  09/14/16 180 lb (81.6 kg)  06/12/16 175 lb (79.4 kg)    Physical Exam  Constitutional: He is oriented to person, place, and time. He appears well-developed. No distress.  NAD  HENT:  Mouth/Throat: Oropharynx is clear and moist.  Eyes: Pupils are equal, round, and reactive to light. Conjunctivae are normal.  Neck: Normal range of motion. No JVD present. No thyromegaly present.  Cardiovascular: Normal rate, regular rhythm, normal heart sounds and intact distal pulses.  Exam reveals no gallop and no friction rub.   No murmur heard. Pulmonary/Chest: Effort normal and breath sounds normal. No respiratory distress. He has no wheezes. He has no rales. He exhibits no tenderness.  Abdominal: Soft. Bowel sounds are normal. He exhibits no distension and no mass. There is no tenderness. There is no rebound and no guarding.  Musculoskeletal: Normal range of motion. He exhibits no edema or tenderness.  Lymphadenopathy:    He has no cervical  adenopathy.  Neurological: He is alert and oriented to person, place, and time. He has normal reflexes. No cranial nerve deficit. He exhibits normal muscle tone. He displays a negative Romberg sign. Coordination abnormal. Gait normal.  Skin: Skin is warm and dry. No rash noted.  Psychiatric: He has a normal mood and affect. His behavior is normal. Judgment and thought content normal.  Cane  Lab Results  Component Value Date   WBC 7.2 07/04/2016   HGB 12.4 (L) 07/04/2016   HCT 37.0 (L) 07/04/2016   PLT 206.0 07/04/2016   GLUCOSE 91 08/06/2016   CHOL 213 (H) 04/24/2016   TRIG 130.0 04/24/2016   HDL 39.00 (L) 04/24/2016   LDLCALC 148 (H) 04/24/2016   ALT 13 (L) 06/06/2016   AST 25 06/06/2016   NA 138 08/06/2016   K 3.7 08/06/2016   CL 102 08/06/2016   CREATININE 1.33 08/06/2016   BUN 13 08/06/2016   CO2 29 08/06/2016   TSH 4.31 05/30/2016   PSA 1.13 04/24/2016   INR 1.09 06/06/2016   HGBA1C 5.6 01/12/2013    Dg Chest 2 View  Result Date: 07/09/2016 CLINICAL DATA:  Pneumonia follow-up . EXAM: CHEST  2 VIEW COMPARISON:  06/06/2016 .  CT 03/02/2016. FINDINGS: Mediastinum and hilar structures are normal. Cardiomegaly with normal pulmonary vascularity. Mild left base infiltrate with small left pleural effusion. No pneumothorax. Aortic and bilateral subclavian atherosclerotic vascular calcification . No acute bony abnormality . IMPRESSION: 1. Persistent mild left base infiltrate. Interim clearing of right base infiltrate. 2. Stable cardiomegaly. 3. Aortic and bilateral subclavian atherosclerotic vascular disease. Electronically Signed   By: Marcello Moores  Register   On: 07/09/2016 09:55    Assessment & Plan:   There are no diagnoses linked to this encounter. I am having Mr. Dillinger maintain his multivitamin-lutein, diltiazem, cholecalciferol, cyanocobalamin, polyethylene glycol powder, omeprazole, calcium carbonate, docusate sodium, promethazine-codeine, lisinopril, fluticasone, gabapentin, and  clidinium-chlordiazePOXIDE.  No orders of the defined types were placed in this encounter.    Follow-up: No Follow-up on file.  Walker Kehr, MD

## 2016-10-15 NOTE — Assessment & Plan Note (Signed)
  Gluten free trial for 4-6 weeks. OK to use gluten-free bread and gluten-free pasta.

## 2016-10-15 NOTE — Assessment & Plan Note (Signed)
Cane

## 2016-10-16 DIAGNOSIS — H40013 Open angle with borderline findings, low risk, bilateral: Secondary | ICD-10-CM | POA: Diagnosis not present

## 2016-10-17 DIAGNOSIS — H353221 Exudative age-related macular degeneration, left eye, with active choroidal neovascularization: Secondary | ICD-10-CM | POA: Diagnosis not present

## 2016-10-17 NOTE — Addendum Note (Signed)
Addended by: Karren Cobble on: 10/17/2016 01:27 PM   Modules accepted: Orders

## 2016-10-22 ENCOUNTER — Ambulatory Visit: Payer: Medicare Other | Admitting: Internal Medicine

## 2016-11-08 DIAGNOSIS — K648 Other hemorrhoids: Secondary | ICD-10-CM | POA: Diagnosis not present

## 2016-11-08 DIAGNOSIS — Z8719 Personal history of other diseases of the digestive system: Secondary | ICD-10-CM | POA: Diagnosis not present

## 2016-11-21 ENCOUNTER — Ambulatory Visit (INDEPENDENT_AMBULATORY_CARE_PROVIDER_SITE_OTHER): Payer: Medicare Other | Admitting: Physician Assistant

## 2016-11-21 ENCOUNTER — Encounter: Payer: Self-pay | Admitting: Physician Assistant

## 2016-11-21 VITALS — BP 136/82 | HR 72 | Ht 73.0 in | Wt 190.0 lb

## 2016-11-21 DIAGNOSIS — K5900 Constipation, unspecified: Secondary | ICD-10-CM

## 2016-11-21 NOTE — Patient Instructions (Signed)
Please purchase the following medications over the counter and take as directed: Align once daily  Increase Miralax twice daily.

## 2016-11-21 NOTE — Progress Notes (Addendum)
Chief Complaint: Constipation  HPI:    Mr. Chad [name] is an 83 year old Caucasian male with a past medical history as listed below, who follows with Dr. Carlean Purl and presents to clinic today with a complaint of constipation.    Patient had an EGD 09/21/10 was normal.  Patient's last colonoscopy was 08/29/10 with findings of moderate diverticulosis, internal and external hemorrhoids and was otherwise normal.    Today, the patient tells me that he was diagnosed with IBS "a long time ago".  He continues his Librax twice a day.  He also continues MiraLAX once daily as well as a daily stool softener.  Patient is a poor historian and it is hard to discern exactly what he is meaning when he tells me that about 3 days a week he feels like he urgently has to run to the bathroom but will then not "produce a nice smooth stool".  Upon further discussion I believe the patient is telling me that he is somewhat constipated occasionally.  It sounds as though he eats a very high fiber diet and likely this is not contributing.  Patient does tell me that he will either feel like he has had a bowel movement and not have one or he will have one and feel like it is not complete.  He describes smaller "pieces of stool instead of a large solid one".  This has been occurring over the past "many months".    Patient denies fever, chills, blood in stool, melena, weight loss, anorexia, nausea, vomiting, heartburn, reflux or symptoms that awaken him at night.  Past Medical History:  Diagnosis Date  . Abnormal EKG    ST changes with normal coronaries by cath 05/06/12.  . Arthritis    "hands" (01/12/2013); feet (podiatry consultation)  . Atrial flutter ([place])    a. Slow ~100bpm when in 2:1; dx 04/2012. b. Placed on Xarelto.  c. s/p ablation 07-08-2012 by Dr Rayann Heman  . Atrial tachycardia ([place])   . Carotid artery calcification    By CXR - dopplers 05/06/12 without obvious evidence of significant ICA stenosis >40%  . Colon polyps 06/26/2005    . COPD (chronic obstructive pulmonary disease) ([place])   . Diverticulosis of colon (without mention of hemorrhage)   . Dysrhythmia   . Gastritis, chronic    Pt denies bleeding 04/2012. EGD 2012 reportedly normal.  . GERD (gastroesophageal reflux disease)   . Hypertension   . Hyponatremia    04/2012 r/t diuretic.  . IBS (irritable bowel syndrome)   . Internal and external hemorrhoids without complication   . Macular degeneration of left eye   . 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 Pneumonia    "couple times" (01/12/2013)  . Shortness of breath dyspnea    walking distances    Past Surgical History:  Procedure Laterality Date  . ATRIAL FLUTTER ABLATION  07-08-2012   of typical atrial flutter by Dr Rayann Heman  . CARDIAC CATHETERIZATION  04/2012  . COLONOSCOPY  08/29/10   diverticulosis, internal hemorrhoids  . ESOPHAGOGASTRODUODENOSCOPY  09/20/10   normal  . EYE SURGERY  01/08/2014   Cataract resection L.    [name] [place]  . 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 TRIGGER FINGER RELEASE Right 1970's   "2 fingers"    Current Outpatient Medications  Medication Sig Dispense Refill  . cholecalciferol (VITAMIN D) 1000 units tablet Take 1 tablet (1,000 Units total) by mouth daily. 100 tablet 3  . clidinium-chlordiazePOXIDE (LIBRAX)  5-2.5 MG capsule Take 1 capsule by mouth 2 (two) times daily. 60 capsule 2  . cyanocobalamin 1000 MCG tablet Take 1,000 mcg by mouth daily as needed (supplement).     [name] diltiazem (CARDIZEM) 30 MG tablet Take 1 tablet (30 mg total) by mouth every 6 (six) hours as needed. For palpitations. 30 tablet 11  . fluticasone (FLONASE) 50 MCG/ACT nasal spray Place 2 sprays into both nostrils daily. 48 g 3  . gabapentin (NEURONTIN) 300 MG capsule Take 3 capsules at bedtime. 270 capsule 3  . lisinopril (PRINIVIL,ZESTRIL) 10 MG tablet TAKE 1 TABLET DAILY 90 tablet 3  . multivitamin-lutein (OCUVITE-LUTEIN) CAPS capsule Take 1 capsule by mouth once a week.     [name]  omeprazole (PRILOSEC) 20 MG capsule TAKE 1 CAPSULE DAILY ONE HOUR BEFORE BREAKFAST 90 capsule 3  . polyethylene glycol powder (GLYCOLAX/MIRALAX) powder Take 17 g by mouth 2 (two) times daily as needed. 3350 g 1   No current facility-administered medications for this visit.     Allergies as of 11/21/2016 - Review Complete 10/15/2016  Allergen Reaction Noted  . Dyazide [hydrochlorothiazide w-triamterene] Other (See Comments) 05/07/2012    Family History  Problem Relation Age of Onset  . Heart disease Brother   . Throat cancer Brother   . Other Mother        UNSURE  . Other Father        UNSURE  . Emphysema Sister   . Emphysema Sister   . Other Brother        UNSURE  . Diabetes Unknown     Social History   Socioeconomic History  . Marital status: Divorced    Spouse name: Not on file  . Number of children: 2  . Years of education: 78  . Highest education level: Not on file  Social Needs  . Financial resource strain: Not on file  . Food insecurity - worry: Not on file  . Food insecurity - inability: Not on file  . Transportation needs - medical: Not on file  . Transportation needs - non-medical: Not on file  Occupational History  . Occupation: Retired  Tobacco Use  . Smoking status: Former Smoker    Packs/day: 0.50    Years: 20.00    Pack years: 10.00    Types: Cigarettes, Pipe, Cigars    Last attempt to quit: 01/08/1966    Years since quitting: 50.9  . Smokeless tobacco: Never Used  . Tobacco comment:    Substance and Sexual Activity  . Alcohol use: Yes    Alcohol/week: 0.0 oz    Comment: 01/12/2013 "did drink 2-3 12 oz beers a day; stopped drinking in 04/2012"  . Drug use: No  . Sexual activity: No  Other Topics Concern  . Not on file  Social History Narrative   Marital status: divorced; lives with ex-wife; not dating in 2017.      Children:  2 sons, 2 grandsons, 1 granddaughter; no gg.      Lives: with ex-wife in house.      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      Tobacco: quit smoking 1997; smoked for 27 years.      Alcohol: quit 2004      Exercise:  Walking daily short distances.       ADLs:  NO assistant devices; has cane if needs it; drives; pays bill; grocery shopping by wife; wife cleans  house and does laundry.     Advanced Directives:  +LIVING WILL; +FULL CODE.  HCPOA: oldest son Chad [name] Sr.)    Review of Systems:    Constitutional: No weight loss, fever or chills Skin: No rash  Cardiovascular: No chest pain Respiratory: No SOB Gastrointestinal: See HPI and otherwise negative   Physical Exam:  Vital signs: BP 136/82   Pulse 72   Ht 6\' 1"  (1.854 m)   Wt 190 lb (86.2 kg)   BMI 25.07 kg/m    Constitutional:   Pleasant Elderly Caucasian male appears to be in NAD, Well developed, Well nourished, alert and cooperative Head:  Normocephalic and atraumatic. Eyes:   PEERL, EOMI. No icterus. Conjunctiva pink. Ears:  Normal auditory acuity. Neck:  Supple Throat: Oral cavity and pharynx without inflammation, swelling or lesion.  Respiratory: Respirations even and unlabored. Lungs clear to auscultation bilaterally.   No wheezes, crackles, or rhonchi.  Cardiovascular: Normal S1, S2. No MRG. Regular rate and rhythm. No peripheral edema, cyanosis or pallor.  Gastrointestinal:  Soft, nondistended, nontender. No rebound or guarding. Normal bowel sounds. No appreciable masses or hepatomegaly. Rectal:  Not performed.  Msk:  Symmetrical without gross deformities. Without edema, no deformity or joint abnormality.  Neurologic:  Alert and  oriented x4;  grossly normal neurologically.  Skin:   Dry and intact without significant lesions or rashes. Psychiatric:  Demonstrates good judgement and reason without abnormal affect or behaviors.  No recent labs or imaging.  Assessment: 1.  Constipation: Patient describes incomplete bowel movements 3 days a week that are  accompanied by a sense of urgency and some gas ; most likely related to known IBS  Plan: 1.  Recommend the patient increase his MiraLAX to twice daily dosing.  Discussed he can take this up to 4 times a day if necessary. 2.  Recommend the patient start a daily probiotic such as Align.  Did provide him with a coupon.  Recommend he stay on this for 2 months. 3.  Patient may continue Librax as prescribed previously twice daily 4.  Patient to follow in clinic with me or Dr. Carlean Purl in 4-6 weeks.  Ellouise Newer, PA-C [facility] 11/21/2016, 2:15 PM  Cc: Cassandria Anger, MD   Agree with Ms. Rishikesh Khachatryan's evaluation and management.  Gatha Mayer, MD, [facility]

## 2016-11-22 ENCOUNTER — Ambulatory Visit: Payer: Medicare Other | Admitting: Physician Assistant

## 2016-12-03 ENCOUNTER — Telehealth: Payer: Self-pay

## 2016-12-03 NOTE — Telephone Encounter (Signed)
-----   Message from Levin Erp, [place] sent at 12/03/2016  1:45 PM EST ----- Regarding: FW: f/u appt Can you please make sure pt has follow up with me or Dr. Carlean Purl around or after 12/21/16. Thanks! JLL  ----- Message ----- From: Gatha Mayer, MD Sent: 12/03/2016   1:29 PM To: Levin Erp, PA Subject: f/u appt                                       I do not see one for you or me?  How does this work??  Is saying with you or me confusing things and leading to no appt?  I am fine with them seeing you but need to close loop.  Thanks  Glendell Docker

## 2016-12-04 NOTE — Telephone Encounter (Signed)
appt with Dr Carlean Purl on 12/10 pt aware

## 2016-12-17 ENCOUNTER — Ambulatory Visit: Payer: Medicare Other | Admitting: Internal Medicine

## 2016-12-19 DIAGNOSIS — H353221 Exudative age-related macular degeneration, left eye, with active choroidal neovascularization: Secondary | ICD-10-CM | POA: Diagnosis not present

## 2017-02-07 ENCOUNTER — Encounter: Payer: Self-pay | Admitting: Internal Medicine

## 2017-02-07 ENCOUNTER — Ambulatory Visit (INDEPENDENT_AMBULATORY_CARE_PROVIDER_SITE_OTHER): Payer: Medicare Other | Admitting: Internal Medicine

## 2017-02-07 VITALS — BP 118/74 | HR 96 | Ht 73.0 in | Wt 196.4 lb

## 2017-02-07 DIAGNOSIS — K588 Other irritable bowel syndrome: Secondary | ICD-10-CM

## 2017-02-07 NOTE — Progress Notes (Signed)
Chad [name] 84 y.o. 08/21/1931 [id number]  Assessment & Plan:   Encounter Diagnosis  Name Primary?  . Irritable bowel syndrome Yes   Pt's sxs likely d/t IBS and associated increased gastrocolic reflex. Pt advised to stop Align since it isn't helping his sxs and start IB guard- 2 tabs before meals- coupons provided. Pt to return if his sxs do not improve.  I have personally seen the patient, reviewed and repeated key elements of the history and physical and participated in formation of the assessment and plan the student has documented.  Gatha Mayer, MD, [facility]  [id number], Evie Lacks, MD   Subjective:   Chief Complaint: constipation f/u  HPI Pt is a 84 yo caucasian male returning for his 2 month follow up of constipation. She was seen by Ellouise Newer, PA-C in November for constipation and was advised to increase his Miralax dose up to 4 times a day if needed and add Align probiotics.  Today he reports no improvement of symptoms. He says that approximately an hour or so after eating, he feels a sense of pressure as though he needs to defecate but often times he's unable to. Most of the time he reports he ends up simply passes flatus and even when has a BM its usually in the form of "hard little balls". He doesn't report feeling constipated, he has a normal BM approx every other day. He reports his biggest concern in feeling embarrassed while eating out with friends and having to go to the bathroom often or passing gas in public.    Allergies  Allergen Reactions  . Dyazide [Hydrochlorothiazide W-Triamterene] Other (See Comments)    Caused hyponatremia 04/2012   Current Meds  Medication Sig  . cholecalciferol (VITAMIN D) 1000 units tablet Take 1 tablet (1,000 Units total) by mouth daily.  . clidinium-chlordiazePOXIDE (LIBRAX) 5-2.5 MG capsule Take 1 capsule by mouth 2 (two) times daily.  . cyanocobalamin 1000 MCG tablet Take 1,000 mcg by mouth daily as needed  (supplement).   [name] diltiazem (CARDIZEM) 30 MG tablet Take 1 tablet (30 mg total) by mouth every 6 (six) hours as needed. For palpitations.  . fluticasone (FLONASE) 50 MCG/ACT nasal spray Place 2 sprays into both nostrils daily.  [name] gabapentin (NEURONTIN) 300 MG capsule Take 3 capsules at bedtime.  [name] lisinopril (PRINIVIL,ZESTRIL) 10 MG tablet TAKE 1 TABLET DAILY (Patient taking differently: take one half daily)  . multivitamin-lutein (OCUVITE-LUTEIN) CAPS capsule Take 1 capsule by mouth once a week.   [name] omeprazole (PRILOSEC) 20 MG capsule TAKE 1 CAPSULE DAILY ONE HOUR BEFORE BREAKFAST  . polyethylene glycol powder (GLYCOLAX/MIRALAX) powder Take 17 g by mouth 2 (two) times daily as needed.   Past Medical History:  Diagnosis Date  . Abnormal EKG    ST changes with normal coronaries by cath 05/06/12.  . Arthritis    "hands" (01/12/2013); feet (podiatry consultation)  . Atrial flutter ([place])    a. Slow ~100bpm when in 2:1; dx 04/2012. b. Placed on Xarelto.  c. s/p ablation 07-08-2012 by Dr Rayann Heman  . Atrial tachycardia ([place])   . Carotid artery calcification    By CXR - dopplers 05/06/12 without obvious evidence of significant ICA stenosis >40%  . Colon polyps 06/26/2005  . COPD (chronic obstructive pulmonary disease) ([place])   . Diverticulosis of colon (without mention of hemorrhage)   . Dysrhythmia   . Gastritis, chronic    Pt denies bleeding 04/2012. EGD 2012 reportedly normal.  . GERD (gastroesophageal reflux  disease)   . Hypertension   . Hyponatremia    04/2012 r/t diuretic.  . IBS (irritable bowel syndrome)   . Internal and external hemorrhoids without complication   . Macular degeneration of left eye   . Oral cancer ([place]) 02/1997  . Pneumonia    "couple times" (01/12/2013)  . Shortness of breath dyspnea    walking distances   Past Surgical History:  Procedure Laterality Date  . ATRIAL FLUTTER ABLATION  07-08-2012   of typical atrial flutter by Dr Rayann Heman  . ATRIAL FLUTTER ABLATION N/A  07/08/2012   Procedure: ATRIAL FLUTTER ABLATION;  Surgeon: Thompson Grayer, MD;  Location: [facility] CATH LAB;  Service: Cardiovascular;  Laterality: N/A;  . CARDIAC CATHETERIZATION  04/2012  . COLONOSCOPY  08/29/10   diverticulosis, internal hemorrhoids  . ESOPHAGOGASTRODUODENOSCOPY  09/20/10   normal  . EYE SURGERY  01/08/2014   Cataract resection L.    [name] [place]  . LEFT HEART CATHETERIZATION WITH CORONARY ANGIOGRAM N/A 05/06/2012   Procedure: LEFT HEART CATHETERIZATION WITH CORONARY ANGIOGRAM;  Surgeon: Peter M Martinique, MD;  Location: [facility] CATH LAB;  Service: Cardiovascular;  Laterality: N/A;  . 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 TRIGGER FINGER RELEASE Right 1970's   "2 fingers"  . WART FULGURATION Left 09/15/2015   Procedure: EXCISIONal biospy of left peri anual and anual canal mass;  Surgeon: Greer Pickerel, MD;  Location: [facility] ORS;  Service: General;  Laterality: Left;   Social History   Social History Narrative   Marital status: divorced; lives with ex-wife; not dating in 2017.      Children:  2 sons, 2 grandsons, 1 granddaughter; no gg.      Lives: with ex-wife in house.      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      Tobacco: quit smoking 1997; smoked for 27 years.      Alcohol: quit 2004      Exercise:  Walking daily short distances.       ADLs:  NO assistant devices; has cane if needs it; drives; pays bill; grocery shopping by wife; wife cleans house and does laundry.     Advanced Directives:  +LIVING WILL; +FULL CODE.  HCPOA: oldest son Zaheer Wageman Sr.)   family history includes Diabetes in his unknown relative; Emphysema in his sister and sister; Heart disease in his brother; Other in his brother, father, and mother; Throat cancer in his brother.   Review of Systems Positive for hard stools, increased flatus. Negative for dyschezia, diarrhea, hematochezia, melena, weight  loss. Negative for all other systems.  Objective:   Physical Exam  @BP  118/74   Pulse 96   Ht 6\' 1"  (1.854 m)   Wt 196 lb 6 oz (89.1 kg)   BMI 25.91 kg/m @  General:  NAD Eyes:   anicteric Abdomen:  soft and nontender, BS+  Rectal:  External: anal wink absent.    DRE: slightly decreased resting tone and decreased   voluntary squeeze. Appropriate simulated    defecation.  Data Reviewed: GI notes, meds.  15 minutes time spent with patient > half in counseling coordination of care

## 2017-02-07 NOTE — Patient Instructions (Signed)
  Stop the align and try IBgard.   Take 2 capsules before your meals .  Coupon provided. Let us know if that doesn't help.    Follow up with [place] as needed.     I appreciate the opportunity to care for you. Silvano Rusk, MD, [facility]

## 2017-02-13 ENCOUNTER — Ambulatory Visit (INDEPENDENT_AMBULATORY_CARE_PROVIDER_SITE_OTHER)
Admission: RE | Admit: 2017-02-13 | Discharge: 2017-02-13 | Disposition: A | Discharge status: Home / Self Care | Payer: Medicare Other | Source: Ambulatory Visit | Attending: Internal Medicine | Admitting: Internal Medicine

## 2017-02-13 ENCOUNTER — Other Ambulatory Visit (INDEPENDENT_AMBULATORY_CARE_PROVIDER_SITE_OTHER): Payer: Medicare Other

## 2017-02-13 ENCOUNTER — Ambulatory Visit (INDEPENDENT_AMBULATORY_CARE_PROVIDER_SITE_OTHER): Payer: Medicare Other | Admitting: Internal Medicine

## 2017-02-13 ENCOUNTER — Encounter: Payer: Self-pay | Admitting: Internal Medicine

## 2017-02-13 VITALS — BP 124/74 | HR 53 | Temp 97.7°F | Ht 73.0 in | Wt 194.0 lb

## 2017-02-13 DIAGNOSIS — R7989 Other specified abnormal findings of blood chemistry: Secondary | ICD-10-CM

## 2017-02-13 DIAGNOSIS — R143 Flatulence: Secondary | ICD-10-CM | POA: Diagnosis not present

## 2017-02-13 DIAGNOSIS — R5383 Other fatigue: Secondary | ICD-10-CM

## 2017-02-13 DIAGNOSIS — I10 Essential (primary) hypertension: Secondary | ICD-10-CM

## 2017-02-13 DIAGNOSIS — R059 Cough, unspecified: Secondary | ICD-10-CM

## 2017-02-13 DIAGNOSIS — R05 Cough: Secondary | ICD-10-CM

## 2017-02-13 LAB — BASIC METABOLIC PANEL
BUN: 15 mg/dL (ref 6–23)
CALCIUM: 8.9 mg/dL (ref 8.4–10.5)
CHLORIDE: 100 meq/L (ref 96–112)
CO2: 31 meq/L (ref 19–32)
Creatinine, Ser: 1.3 mg/dL (ref 0.40–1.50)
GFR: 55.93 mL/min — ABNORMAL LOW (ref >60.00)
GLUCOSE: 98 mg/dL (ref 70–99)
POTASSIUM: 4.6 meq/L (ref 3.5–5.1)
SODIUM: 135 meq/L (ref 135–145)

## 2017-02-13 LAB — TSH: TSH: 4.31 u[IU]/mL (ref 0.35–4.50)

## 2017-02-13 MED ORDER — METRONIDAZOLE 500 MG PO TABS: 500.0000 mg | ORAL_TABLET | Freq: Three times a day (TID) | ORAL | 0 refills | Status: DC

## 2017-02-13 MED ORDER — ALIGN 4 MG PO CAPS
1.0000 | ORAL_CAPSULE | Freq: Every day | ORAL | 0 refills | Status: DC
Start: 2017-02-13 — End: 2017-06-03

## 2017-02-13 MED ORDER — VALSARTAN 80 MG PO TABS: 80.0000 mg | ORAL_TABLET | Freq: Every day | ORAL | 11 refills | Status: DC

## 2017-02-13 NOTE — Assessment & Plan Note (Signed)
Lisinopril - d/c due to cogh. Start Diovan 80 mg/d CXR

## 2017-02-13 NOTE — Assessment & Plan Note (Signed)
Labs

## 2017-02-13 NOTE — Assessment & Plan Note (Signed)
Try Flagyl and Align

## 2017-02-13 NOTE — Assessment & Plan Note (Signed)
Lisinopril - d/c due to cogh. Start Diovan 80 mg/d

## 2017-02-13 NOTE — Progress Notes (Signed)
Subjective:  Patient ID: Chad [name], male    DOB: 1931-02-17  Age: 84 y.o. MRN: [id number]  CC: No chief complaint on file.   HPI Chad [name] presents for c/o cough  x months. Taking Lisinopril 1/2 tab. F/u HTN, COPD C/o bad gas....  Outpatient Medications Prior to Visit  Medication Sig Dispense Refill  . cholecalciferol (VITAMIN D) 1000 units tablet Take 1 tablet (1,000 Units total) by mouth daily. 100 tablet 3  . clidinium-chlordiazePOXIDE (LIBRAX) 5-2.5 MG capsule Take 1 capsule by mouth 2 (two) times daily. 60 capsule 2  . cyanocobalamin 1000 MCG tablet Take 1,000 mcg by mouth daily as needed (supplement).     [name] diltiazem (CARDIZEM) 30 MG tablet Take 1 tablet (30 mg total) by mouth every 6 (six) hours as needed. For palpitations. 30 tablet 11  . fluticasone (FLONASE) 50 MCG/ACT nasal spray Place 2 sprays into both nostrils daily. 48 g 3  . gabapentin (NEURONTIN) 300 MG capsule Take 3 capsules at bedtime. 270 capsule 3  . lisinopril (PRINIVIL,ZESTRIL) 10 MG tablet TAKE 1 TABLET DAILY (Patient taking differently: take one half daily) 90 tablet 3  . multivitamin-lutein (OCUVITE-LUTEIN) CAPS capsule Take 1 capsule by mouth once a week.     [name] omeprazole (PRILOSEC) 20 MG capsule TAKE 1 CAPSULE DAILY ONE HOUR BEFORE BREAKFAST 90 capsule 3  . polyethylene glycol powder (GLYCOLAX/MIRALAX) powder Take 17 g by mouth 2 (two) times daily as needed. 3350 g 1   No facility-administered medications prior to visit.     ROS Review of Systems  Constitutional: Positive for fatigue. Negative for appetite change and unexpected weight change.  HENT: Negative for congestion, nosebleeds, sneezing, sore throat and trouble swallowing.   Eyes: Negative for itching and visual disturbance.  Respiratory: Positive for cough.   Cardiovascular: Negative for chest pain, palpitations and leg swelling.  Gastrointestinal: Negative for abdominal distention, blood in stool, diarrhea and nausea.    Genitourinary: Negative for frequency and hematuria.  Musculoskeletal: Negative for back pain, gait problem, joint swelling and neck pain.  Skin: Negative for rash.  Neurological: Negative for dizziness, tremors, speech difficulty and weakness.  Psychiatric/Behavioral: Negative for agitation, dysphoric mood and sleep disturbance. The patient is not nervous/anxious.     Objective:  BP 124/74 (BP Location: Left Arm, Patient Position: Sitting, Cuff Size: Normal)   Pulse (!) 53   Temp 97.7 F (36.5 C) (Oral)   Ht 6\' 1"  (1.854 m)   Wt 194 lb (88 kg)   SpO2 95%   BMI 25.60 kg/m   BP Readings from Last 3 Encounters:  02/13/17 124/74  02/07/17 118/74  11/21/16 136/82    Wt Readings from Last 3 Encounters:  02/13/17 194 lb (88 kg)  02/07/17 196 lb 6 oz (89.1 kg)  11/21/16 190 lb (86.2 kg)    Physical Exam  Constitutional: He is oriented to person, place, and time. He appears well-developed. No distress.  NAD  HENT:  Mouth/Throat: Oropharynx is clear and moist.  Eyes: Conjunctivae are normal. Pupils are equal, round, and reactive to light.  Neck: Normal range of motion. No JVD present. No thyromegaly present.  Cardiovascular: Normal rate, regular rhythm and intact distal pulses. Exam reveals no gallop and no friction rub.  Murmur heard. Pulmonary/Chest: Effort normal and breath sounds normal. No respiratory distress. He has no wheezes. He has no rales. He exhibits no tenderness.  Abdominal: Soft. Bowel sounds are normal. He exhibits mass. He exhibits no distension. There is no  tenderness. There is no rebound and no guarding.  Musculoskeletal: Normal range of motion. He exhibits no edema or tenderness.  Lymphadenopathy:    He has no cervical adenopathy.  Neurological: He is alert and oriented to person, place, and time. He has normal reflexes. No cranial nerve deficit. He exhibits normal muscle tone. He displays a negative Romberg sign. Coordination abnormal. Gait normal.  Skin:  Skin is warm and dry. No rash noted.  Psychiatric: He has a normal mood and affect. His behavior is normal. Judgment and thought content normal.  umbil hernia Cane  Lab Results  Component Value Date   WBC 7.2 07/04/2016   HGB 12.4 (L) 07/04/2016   HCT 37.0 (L) 07/04/2016   PLT 206.0 07/04/2016   GLUCOSE 91 08/06/2016   CHOL 213 (H) 04/24/2016   TRIG 130.0 04/24/2016   HDL 39.00 (L) 04/24/2016   LDLCALC 148 (H) 04/24/2016   ALT 13 (L) 06/06/2016   AST 25 06/06/2016   NA 138 08/06/2016   K 3.7 08/06/2016   CL 102 08/06/2016   CREATININE 1.33 08/06/2016   BUN 13 08/06/2016   CO2 29 08/06/2016   TSH 4.31 05/30/2016   PSA 1.13 04/24/2016   INR 1.09 06/06/2016   HGBA1C 5.6 01/12/2013    Dg Chest 2 View  Result Date: 07/09/2016 CLINICAL DATA:  Pneumonia follow-up . EXAM: CHEST  2 VIEW COMPARISON:  06/06/2016 .  CT 03/02/2016. FINDINGS: Mediastinum and hilar structures are normal. Cardiomegaly with normal pulmonary vascularity. Mild left base infiltrate with small left pleural effusion. No pneumothorax. Aortic and bilateral subclavian atherosclerotic vascular calcification . No acute bony abnormality . IMPRESSION: 1. Persistent mild left base infiltrate. Interim clearing of right base infiltrate. 2. Stable cardiomegaly. 3. Aortic and bilateral subclavian atherosclerotic vascular disease. Electronically Signed   By: Marcello Moores  Register   On: 07/09/2016 09:55    Assessment & Plan:   There are no diagnoses linked to this encounter. I am having Chad [name] maintain his multivitamin-lutein, diltiazem, cyanocobalamin, polyethylene glycol powder, omeprazole, lisinopril, fluticasone, gabapentin, clidinium-chlordiazePOXIDE, and cholecalciferol.  No orders of the defined types were placed in this encounter.    Follow-up: No Follow-up on file.  Walker Kehr, MD

## 2017-02-14 ENCOUNTER — Telehealth: Payer: Self-pay | Admitting: Internal Medicine

## 2017-02-14 DIAGNOSIS — H353221 Exudative age-related macular degeneration, left eye, with active choroidal neovascularization: Secondary | ICD-10-CM | POA: Diagnosis not present

## 2017-02-14 NOTE — Telephone Encounter (Signed)
Copied from [place]. Topic: Quick Communication - See Telephone Encounter >> Feb 14, 2017  9:40 AM Burnis Medin, NT wrote: CRM for notification. See Telephone encounter for: Patient called and said pharmacy sent over a request for another medication because they were out of valsartan (DIOVAN) 80 MG tablet and hadn't heard back.   02/14/17.

## 2017-02-14 NOTE — Telephone Encounter (Signed)
See pt. Request. Thanks. 

## 2017-02-14 NOTE — Telephone Encounter (Signed)
Valsartan has been recalled. Needing alternative...Chad [name]

## 2017-02-15 MED ORDER — LOSARTAN POTASSIUM 50 MG PO TABS: 50.0000 mg | ORAL_TABLET | Freq: Every day | ORAL | 11 refills | Status: DC

## 2017-02-15 MED ORDER — LOSARTAN POTASSIUM 50 MG PO TABS: 50.0000 mg | ORAL_TABLET | Freq: Every day | ORAL | 0 refills | Status: DC

## 2017-02-15 NOTE — Telephone Encounter (Signed)
Losartan emailed.  Thank you

## 2017-02-15 NOTE — Telephone Encounter (Signed)
Called pt no answer LMOM w/MD response, also sent short term supply to [facility].Marland KitchenJohny [name]

## 2017-03-05 NOTE — Progress Notes (Signed)
HPI: FU atrial tachycardia and aortic stenosis. Patient presented in April of 2014 with dizziness and noted to be in atrial flutter. Cardiac catheterization on 05/06/12 revealed normal coronary arteries. Carotid Dopplers April 2014 showed no significant stenosis. TSH normal. Patient had atrial flutter ablation on 07/08/2012. Admitted with atrial tachycardia 1/15. Declined ablation. Treated with short acting cardizem as he had evidence of tachy brady. Last echocardiogram July 2017 showed normal LV systolic function, mild aortic stenosis with mean gradient 11 mmHg, trace aortic insufficiency and moderate biatrial enlargement. Holter monitor 9/18 showed rate controlled atrial tachycardic. Since last seen, patient occasionally has mild dyspnea but denies orthopnea, PND, pedal edema, chest pain or syncope.  Current Outpatient Medications  Medication Sig Dispense Refill  . cholecalciferol (VITAMIN D) 1000 units tablet Take 1 tablet (1,000 Units total) by mouth daily. 100 tablet 3  . clidinium-chlordiazePOXIDE (LIBRAX) 5-2.5 MG capsule Take 1 capsule by mouth 2 (two) times daily. 60 capsule 2  . cyanocobalamin 1000 MCG tablet Take 1,000 mcg by mouth daily as needed (supplement).     [name] diltiazem (CARDIZEM) 30 MG tablet Take 1 tablet (30 mg total) by mouth every 6 (six) hours as needed. For palpitations. 30 tablet 11  . fluticasone (FLONASE) 50 MCG/ACT nasal spray Place 2 sprays into both nostrils daily. 48 g 3  . gabapentin (NEURONTIN) 300 MG capsule Take 3 capsules at bedtime. 270 capsule 3  . metroNIDAZOLE (FLAGYL) 500 MG tablet Take 1 tablet (500 mg total) by mouth 3 (three) times daily. 21 tablet 0  . multivitamin-lutein (OCUVITE-LUTEIN) CAPS capsule Take 1 capsule by mouth once a week.     [name] omeprazole (PRILOSEC) 20 MG capsule TAKE 1 CAPSULE DAILY ONE HOUR BEFORE BREAKFAST 90 capsule 3  . polyethylene glycol powder (GLYCOLAX/MIRALAX) powder Take 17 g by mouth 2 (two) times daily as needed. 3350 g  1  . Probiotic Product (ALIGN) 4 MG CAPS Take 1 capsule (4 mg total) by mouth daily. Start to take after you finish Metronidazole 30 capsule 0  . valsartan (DIOVAN) 80 MG tablet Take 80 mg by mouth daily.     No current facility-administered medications for this visit.      Past Medical History:  Diagnosis Date  . Abnormal EKG    ST changes with normal coronaries by cath 05/06/12.  . Arthritis    "hands" (01/12/2013); feet (podiatry consultation)  . Atrial flutter ([place])    a. Slow ~100bpm when in 2:1; dx 04/2012. b. Placed on Xarelto.  c. s/p ablation 07-08-2012 by Dr Rayann Heman  . Atrial tachycardia ([place])   . Carotid artery calcification    By CXR - dopplers 05/06/12 without obvious evidence of significant ICA stenosis >40%  . Colon polyps 06/26/2005  . COPD (chronic obstructive pulmonary disease) ([place])   . Diverticulosis of colon (without mention of hemorrhage)   . Dysrhythmia   . Gastritis, chronic    Pt denies bleeding 04/2012. EGD 2012 reportedly normal.  . GERD (gastroesophageal reflux disease)   . Hypertension   . Hyponatremia    04/2012 r/t diuretic.  . IBS (irritable bowel syndrome)   . Internal and external hemorrhoids without complication   . Macular degeneration of left eye   . Oral cancer ([place]) 02/1997  . Pneumonia    "couple times" (01/12/2013)  . Shortness of breath dyspnea    walking distances    Past Surgical History:  Procedure Laterality Date  . ATRIAL FLUTTER ABLATION  07-08-2012   of  typical atrial flutter by Dr Rayann Heman  . ATRIAL FLUTTER ABLATION N/A 07/08/2012   Procedure: ATRIAL FLUTTER ABLATION;  Surgeon: Thompson Grayer, MD;  Location: [facility] CATH LAB;  Service: Cardiovascular;  Laterality: N/A;  . CARDIAC CATHETERIZATION  04/2012  . COLONOSCOPY  08/29/10   diverticulosis, internal hemorrhoids  . ESOPHAGOGASTRODUODENOSCOPY  09/20/10   normal  . EYE SURGERY  01/08/2014   Cataract resection L.    [name] [place]  . LEFT HEART CATHETERIZATION WITH CORONARY  ANGIOGRAM N/A 05/06/2012   Procedure: LEFT HEART CATHETERIZATION WITH CORONARY ANGIOGRAM;  Surgeon: Peter M Martinique, MD;  Location: [facility] CATH LAB;  Service: Cardiovascular;  Laterality: N/A;  . 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 TRIGGER FINGER RELEASE Right 1970's   "2 fingers"  . WART FULGURATION Left 09/15/2015   Procedure: EXCISIONal biospy of left peri anual and anual canal mass;  Surgeon: Greer Pickerel, MD;  Location: [facility] ORS;  Service: General;  Laterality: Left;    Social History   Socioeconomic History  . Marital status: Divorced    Spouse name: Not on file  . Number of children: 2  . Years of education: 21  . Highest education level: Not on file  Social Needs  . Financial resource strain: Not on file  . Food insecurity - worry: Not on file  . Food insecurity - inability: Not on file  . Transportation needs - medical: Not on file  . Transportation needs - non-medical: Not on file  Occupational History  . Occupation: Retired  Tobacco Use  . Smoking status: Former Smoker    Packs/day: 0.50    Years: 20.00    Pack years: 10.00    Types: Cigarettes, Pipe, Cigars    Last attempt to quit: 01/08/1966    Years since quitting: 51.1  . Smokeless tobacco: Never Used  . Tobacco comment:    Substance and Sexual Activity  . Alcohol use: No    Alcohol/week: 0.0 oz    Frequency: Never    Comment: 01/12/2013 "did drink 2-3 12 oz beers a day; stopped drinking in 04/2012"  . Drug use: No  . Sexual activity: No  Other Topics Concern  . Not on file  Social History Narrative   Marital status: divorced; lives with ex-wife; not dating in 2017.      Children:  2 sons, 2 grandsons, 1 granddaughter; no gg.      Lives: with ex-wife in house.      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      Tobacco: quit smoking 1997; smoked for 27 years.      Alcohol: quit 2004      Exercise:  Walking daily short distances.         ADLs:  NO assistant devices; has cane if needs it; drives; pays bill; grocery shopping by wife; wife cleans house and does laundry.     Advanced Directives:  +LIVING WILL; +FULL CODE.  HCPOA: oldest son Tevyn Codd Sr.)    Family History  Problem Relation Age of Onset  . Heart disease Brother   . Throat cancer Brother   . Other Mother        UNSURE  . Other Father        UNSURE  . Emphysema Sister   . Emphysema Sister   . Other Brother        UNSURE  . Diabetes  Unknown     ROS: no fevers or chills, productive cough, hemoptysis, dysphasia, odynophagia, melena, hematochezia, dysuria, hematuria, rash, seizure activity, orthopnea, PND, pedal edema, claudication. Remaining systems are negative.  Physical Exam: Well-developed well-nourished in no acute distress.  Skin is warm and dry.  HEENT is normal.  Neck is supple.  Chest is clear to auscultation with normal expansion.  Cardiovascular exam is regular rate and rhythm.  3/6 systolic murmur left sternal border. Abdominal exam nontender or distended. No masses palpated. Extremities show trace edema. neuro grossly intact   A/P  1 atrial tachycardia-remains asymptomatic with no dyspnea, chest pain or palpitations.  We have therefore elected to continue with rate control.  Continue Cardizem as needed.  2  2 history of aortic stenosis-murmur louder on examination; repeat echocardiogram.    3 hypertension-blood pressure is elevated.  However he states typically controlled.  Continue present medications and follow.  4 history of atrial flutter-status post ablation.    Kirk Ruths, MD

## 2017-03-06 ENCOUNTER — Encounter: Payer: Self-pay | Admitting: Cardiology

## 2017-03-06 ENCOUNTER — Ambulatory Visit (INDEPENDENT_AMBULATORY_CARE_PROVIDER_SITE_OTHER): Payer: Medicare Other | Admitting: Cardiology

## 2017-03-06 VITALS — BP 158/95 | HR 90 | Ht 73.0 in | Wt 194.6 lb

## 2017-03-06 DIAGNOSIS — I471 Supraventricular tachycardia: Secondary | ICD-10-CM | POA: Diagnosis not present

## 2017-03-06 DIAGNOSIS — I350 Nonrheumatic aortic (valve) stenosis: Secondary | ICD-10-CM | POA: Diagnosis not present

## 2017-03-06 DIAGNOSIS — I10 Essential (primary) hypertension: Secondary | ICD-10-CM | POA: Diagnosis not present

## 2017-03-06 NOTE — Patient Instructions (Signed)

## 2017-03-07 ENCOUNTER — Telehealth (HOSPITAL_COMMUNITY): Payer: Self-pay | Admitting: Cardiology

## 2017-03-07 NOTE — Telephone Encounter (Signed)
User: Cherie Dark A Date/time: 03/07/17 2:44 PM  Comment: Called pt and lmsg for him to CB to r/s echo due to the tech being out from the flu  Context:  Outcome: Left Message  Phone number: [phone] Phone Type: Home Phone  [place]. type: Telephone Call type: Outgoing  Contact: Harl Favor E Relation to patient: Self

## 2017-03-08 ENCOUNTER — Other Ambulatory Visit: Payer: Self-pay

## 2017-03-08 ENCOUNTER — Ambulatory Visit (HOSPITAL_COMMUNITY): Payer: Medicare Other | Attending: Cardiology

## 2017-03-08 DIAGNOSIS — I10 Essential (primary) hypertension: Secondary | ICD-10-CM | POA: Diagnosis not present

## 2017-03-08 DIAGNOSIS — Z87891 Personal history of nicotine dependence: Secondary | ICD-10-CM | POA: Insufficient documentation

## 2017-03-08 DIAGNOSIS — I350 Nonrheumatic aortic (valve) stenosis: Secondary | ICD-10-CM | POA: Insufficient documentation

## 2017-03-11 ENCOUNTER — Ambulatory Visit (INDEPENDENT_AMBULATORY_CARE_PROVIDER_SITE_OTHER)
Admission: RE | Admit: 2017-03-11 | Discharge: 2017-03-11 | Disposition: A | Discharge status: Home / Self Care | Payer: Medicare Other | Source: Ambulatory Visit | Attending: Internal Medicine | Admitting: Internal Medicine

## 2017-03-11 ENCOUNTER — Ambulatory Visit (INDEPENDENT_AMBULATORY_CARE_PROVIDER_SITE_OTHER): Payer: Medicare Other | Admitting: Internal Medicine

## 2017-03-11 ENCOUNTER — Encounter: Payer: Self-pay | Admitting: Internal Medicine

## 2017-03-11 DIAGNOSIS — J189 Pneumonia, unspecified organism: Secondary | ICD-10-CM | POA: Diagnosis not present

## 2017-03-11 DIAGNOSIS — I471 Supraventricular tachycardia: Secondary | ICD-10-CM | POA: Diagnosis not present

## 2017-03-11 DIAGNOSIS — I10 Essential (primary) hypertension: Secondary | ICD-10-CM

## 2017-03-11 DIAGNOSIS — R7989 Other specified abnormal findings of blood chemistry: Secondary | ICD-10-CM

## 2017-03-11 DIAGNOSIS — R05 Cough: Secondary | ICD-10-CM | POA: Diagnosis not present

## 2017-03-11 MED ORDER — DILTIAZEM HCL ER COATED BEADS 120 MG PO CP24: 120.0000 mg | ORAL_CAPSULE | Freq: Every day | ORAL | 3 refills | Status: DC

## 2017-03-11 NOTE — Assessment & Plan Note (Signed)
CXR today. CT chest if needed

## 2017-03-11 NOTE — Assessment & Plan Note (Signed)
Diovan 80 mg/d. Added Cardizem CD at [facility]

## 2017-03-11 NOTE — Progress Notes (Addendum)
Subjective:  Patient ID: Chad [name], male    DOB: 1931-06-13  Age: 84 y.o. MRN: [id number]  CC: No chief complaint on file.   HPI KOLT MCWHIRTER presents for pneumonia, HTN, GERD f/u Pt refused shingrix  Outpatient Medications Prior to Visit  Medication Sig Dispense Refill  . cholecalciferol (VITAMIN D) 1000 units tablet Take 1 tablet (1,000 Units total) by mouth daily. 100 tablet 3  . clidinium-chlordiazePOXIDE (LIBRAX) 5-2.5 MG capsule Take 1 capsule by mouth 2 (two) times daily. 60 capsule 2  . cyanocobalamin 1000 MCG tablet Take 1,000 mcg by mouth daily as needed (supplement).     [name] diltiazem (CARDIZEM) 30 MG tablet Take 1 tablet (30 mg total) by mouth every 6 (six) hours as needed. For palpitations. 30 tablet 11  . fluticasone (FLONASE) 50 MCG/ACT nasal spray Place 2 sprays into both nostrils daily. 48 g 3  . gabapentin (NEURONTIN) 300 MG capsule Take 3 capsules at bedtime. 270 capsule 3  . metroNIDAZOLE (FLAGYL) 500 MG tablet Take 1 tablet (500 mg total) by mouth 3 (three) times daily. 21 tablet 0  . multivitamin-lutein (OCUVITE-LUTEIN) CAPS capsule Take 1 capsule by mouth once a week.     [name] omeprazole (PRILOSEC) 20 MG capsule TAKE 1 CAPSULE DAILY ONE HOUR BEFORE BREAKFAST 90 capsule 3  . polyethylene glycol powder (GLYCOLAX/MIRALAX) powder Take 17 g by mouth 2 (two) times daily as needed. 3350 g 1  . Probiotic Product (ALIGN) 4 MG CAPS Take 1 capsule (4 mg total) by mouth daily. Start to take after you finish Metronidazole 30 capsule 0  . valsartan (DIOVAN) 80 MG tablet Take 80 mg by mouth daily.     No facility-administered medications prior to visit.     ROS Review of Systems  Constitutional: Negative for appetite change, fatigue and unexpected weight change.  HENT: Negative for congestion, nosebleeds, sneezing, sore throat and trouble swallowing.   Eyes: Negative for itching and visual disturbance.  Respiratory: Negative for cough.   Cardiovascular: Negative for  chest pain, palpitations and leg swelling.  Gastrointestinal: Negative for abdominal distention, blood in stool, diarrhea and nausea.  Genitourinary: Negative for frequency and hematuria.  Musculoskeletal: Positive for arthralgias. Negative for back pain, gait problem, joint swelling and neck pain.  Skin: Negative for rash.  Neurological: Negative for dizziness, tremors, speech difficulty and weakness.  Psychiatric/Behavioral: Negative for agitation, dysphoric mood and sleep disturbance. The patient is not nervous/anxious.     Objective:  BP (!) 152/88 (BP Location: Left Arm, Patient Position: Sitting, Cuff Size: Normal)   Pulse (!) 57   Temp 98.2 F (36.8 C) (Oral)   Ht 6\' 1"  (1.854 m)   Wt 196 lb (88.9 kg)   SpO2 95%   BMI 25.86 kg/m   BP Readings from Last 3 Encounters:  03/11/17 (!) 152/88  03/06/17 (!) 158/95  02/13/17 124/74    Wt Readings from Last 3 Encounters:  03/11/17 196 lb (88.9 kg)  03/06/17 194 lb 9.6 oz (88.3 kg)  02/13/17 194 lb (88 kg)    Physical Exam  Constitutional: He is oriented to person, place, and time. He appears well-developed. No distress.  NAD  HENT:  Mouth/Throat: Oropharynx is clear and moist.  Eyes: Conjunctivae are normal. Pupils are equal, round, and reactive to light.  Neck: Normal range of motion. No JVD present. No thyromegaly present.  Cardiovascular: Normal rate, regular rhythm and intact distal pulses. Exam reveals no gallop and no friction rub.  Murmur heard. Pulmonary/Chest:  Effort normal and breath sounds normal. No respiratory distress. He has no wheezes. He has no rales. He exhibits no tenderness.  Abdominal: Soft. Bowel sounds are normal. He exhibits no distension and no mass. There is no tenderness. There is no rebound and no guarding.  Musculoskeletal: Normal range of motion. He exhibits no edema or tenderness.  Lymphadenopathy:    He has no cervical adenopathy.  Neurological: He is alert and oriented to person, place, and  time. He has normal reflexes. No cranial nerve deficit. He exhibits normal muscle tone. He displays a negative Romberg sign. Coordination abnormal. Gait normal.  Skin: Skin is warm and dry. No rash noted.  Psychiatric: He has a normal mood and affect. His behavior is normal. Judgment and thought content normal.  cane  Lab Results  Component Value Date   WBC 7.2 07/04/2016   HGB 12.4 (L) 07/04/2016   HCT 37.0 (L) 07/04/2016   PLT 206.0 07/04/2016   GLUCOSE 98 02/13/2017   CHOL 213 (H) 04/24/2016   TRIG 130.0 04/24/2016   HDL 39.00 (L) 04/24/2016   LDLCALC 148 (H) 04/24/2016   ALT 13 (L) 06/06/2016   AST 25 06/06/2016   NA 135 02/13/2017   K 4.6 02/13/2017   CL 100 02/13/2017   CREATININE 1.30 02/13/2017   BUN 15 02/13/2017   CO2 31 02/13/2017   TSH 4.31 02/13/2017   PSA 1.13 04/24/2016   INR 1.09 06/06/2016   HGBA1C 5.6 01/12/2013    Dg Chest 2 View  Result Date: 02/14/2017 CLINICAL DATA:  Cough, fatigue EXAM: CHEST  2 VIEW COMPARISON:  07/04/2016 FINDINGS: Continued airspace opacity at the left lung base is unchanged. No confluent opacity on the right. Biapical scarring. Heart is borderline in size. No effusions or acute bony abnormality. IMPRESSION: Persistent left basilar airspace opacity. This could reflect recurrent residual pneumonia. Given the recurrence or persistence, this could be further evaluated with chest CT. Electronically Signed   By: Rolm Baptise M.D.   On: 02/14/2017 08:43    Assessment & Plan:   There are no diagnoses linked to this encounter. I am having Olman E. Lorenzi maintain his multivitamin-lutein, diltiazem, cyanocobalamin, polyethylene glycol powder, omeprazole, fluticasone, gabapentin, clidinium-chlordiazePOXIDE, cholecalciferol, metroNIDAZOLE, ALIGN, and valsartan.  No orders of the defined types were placed in this encounter.    Follow-up: No Follow-up on file.  Walker Kehr, MD

## 2017-03-11 NOTE — Assessment & Plan Note (Signed)
  Added Cardizem CD at [facility]

## 2017-03-11 NOTE — Assessment & Plan Note (Signed)
Last TSH was ok

## 2017-03-12 ENCOUNTER — Other Ambulatory Visit: Payer: Self-pay | Admitting: *Deleted

## 2017-03-12 ENCOUNTER — Other Ambulatory Visit: Payer: Self-pay | Admitting: Internal Medicine

## 2017-03-12 DIAGNOSIS — R911 Solitary pulmonary nodule: Secondary | ICD-10-CM

## 2017-03-12 MED ORDER — VALSARTAN 80 MG PO TABS: 80.0000 mg | ORAL_TABLET | Freq: Every day | ORAL | 1 refills | Status: DC

## 2017-03-21 ENCOUNTER — Ambulatory Visit (INDEPENDENT_AMBULATORY_CARE_PROVIDER_SITE_OTHER)
Admission: RE | Admit: 2017-03-21 | Discharge: 2017-03-21 | Disposition: A | Discharge status: Home / Self Care | Payer: Medicare Other | Source: Ambulatory Visit | Attending: Internal Medicine | Admitting: Internal Medicine

## 2017-03-21 DIAGNOSIS — J90 Pleural effusion, not elsewhere classified: Secondary | ICD-10-CM | POA: Diagnosis not present

## 2017-03-21 DIAGNOSIS — R911 Solitary pulmonary nodule: Secondary | ICD-10-CM | POA: Diagnosis not present

## 2017-03-21 MED ORDER — IOPAMIDOL (ISOVUE-300) INJECTION 61%
80.0000 mL | Freq: Once | INTRAVENOUS | Status: AC | PRN
  Administered 2017-03-21: 80 mL via INTRAVENOUS

## 2017-03-26 ENCOUNTER — Telehealth: Payer: Self-pay | Admitting: Internal Medicine

## 2017-03-26 DIAGNOSIS — J189 Pneumonia, unspecified organism: Secondary | ICD-10-CM

## 2017-03-26 DIAGNOSIS — R0600 Dyspnea, unspecified: Secondary | ICD-10-CM

## 2017-03-26 NOTE — Telephone Encounter (Signed)
Please advise 

## 2017-03-26 NOTE — Telephone Encounter (Signed)
Pt informed of results, he is still have SOB wants to know what to do next, please advise

## 2017-03-26 NOTE — Telephone Encounter (Signed)
Copied from [place]. Topic: Quick Communication - Other Results >> Mar 26, 2017  8:57 AM Chad [name], RMA wrote: Pt called for CT results please call [phone]

## 2017-03-27 NOTE — Telephone Encounter (Signed)
Please start walking.  Gradually build up the distance.   I will refer him to see a pulmonary doctor for consultation.

## 2017-03-28 NOTE — Telephone Encounter (Signed)
LM notifying pt

## 2017-03-31 ENCOUNTER — Other Ambulatory Visit: Payer: Self-pay | Admitting: Internal Medicine

## 2017-04-02 ENCOUNTER — Telehealth: Payer: Self-pay | Admitting: Internal Medicine

## 2017-04-02 NOTE — Telephone Encounter (Signed)
Copied from [place] [phone]. Topic: Referral - Status >> Apr 02, 2017  1:14 PM Cleaster Corin, [place] wrote: Reason for CRM: pt. Calling to check on status of referral for a lung specialist. Pt. Would like a callback

## 2017-04-02 NOTE — Telephone Encounter (Signed)
Pt scheduled for 04/24/17.

## 2017-04-15 ENCOUNTER — Ambulatory Visit: Payer: Medicare Other | Admitting: Internal Medicine

## 2017-04-16 ENCOUNTER — Ambulatory Visit: Payer: Medicare Other | Admitting: Cardiology

## 2017-04-18 DIAGNOSIS — H40013 Open angle with borderline findings, low risk, bilateral: Secondary | ICD-10-CM | POA: Diagnosis not present

## 2017-04-24 ENCOUNTER — Ambulatory Visit (INDEPENDENT_AMBULATORY_CARE_PROVIDER_SITE_OTHER): Payer: Medicare Other | Admitting: Internal Medicine

## 2017-04-24 ENCOUNTER — Other Ambulatory Visit (INDEPENDENT_AMBULATORY_CARE_PROVIDER_SITE_OTHER): Payer: Medicare Other

## 2017-04-24 ENCOUNTER — Encounter: Payer: Self-pay | Admitting: Internal Medicine

## 2017-04-24 VITALS — BP 112/70 | HR 60 | Ht 73.0 in | Wt 187.0 lb

## 2017-04-24 DIAGNOSIS — I639 Cerebral infarction, unspecified: Secondary | ICD-10-CM

## 2017-04-24 DIAGNOSIS — R0609 Other forms of dyspnea: Secondary | ICD-10-CM

## 2017-04-24 LAB — CBC WITH DIFFERENTIAL/PLATELET
BASOS ABS: 0 10*3/uL (ref 0.0–0.1)
Basophils Relative: 0.2 % (ref 0.0–3.0)
Eosinophils Absolute: 0.2 10*3/uL (ref 0.0–0.7)
Eosinophils Relative: 2.1 % (ref 0.0–5.0)
HEMATOCRIT: 35.7 % — AB (ref 39.0–52.0)
HEMOGLOBIN: 11.8 g/dL — AB (ref 13.0–17.0)
LYMPHS PCT: 17.9 % (ref 12.0–46.0)
Lymphs Abs: 1.5 10*3/uL (ref 0.7–4.0)
MCHC: 33.1 g/dL (ref 30.0–36.0)
MCV: 85 fl (ref 78.0–100.0)
Monocytes Absolute: 0.9 10*3/uL (ref 0.1–1.0)
Monocytes Relative: 10.8 % (ref 3.0–12.0)
NEUTROS ABS: 5.6 10*3/uL (ref 1.4–7.7)
Neutrophils Relative %: 69 % (ref 43.0–77.0)
PLATELETS: 238 10*3/uL (ref 150.0–400.0)
RBC: 4.2 Mil/uL — ABNORMAL LOW (ref 4.22–5.81)
RDW: 15.3 % (ref 11.5–15.5)
WBC: 8.1 10*3/uL (ref 4.0–10.5)

## 2017-04-24 LAB — BRAIN NATRIURETIC PEPTIDE: Pro B Natriuretic peptide (BNP): 246 pg/mL — ABNORMAL HIGH (ref 0.0–100.0)

## 2017-04-24 LAB — BASIC METABOLIC PANEL
BUN: 24 mg/dL — AB (ref 6–23)
CO2: 26 meq/L (ref 19–32)
Calcium: 8.7 mg/dL (ref 8.4–10.5)
Chloride: 101 mEq/L (ref 96–112)
Creatinine, Ser: 1.81 mg/dL — ABNORMAL HIGH (ref 0.40–1.50)
GFR: 38.15 mL/min — AB (ref >60.00)
GLUCOSE: 84 mg/dL (ref 70–99)
POTASSIUM: 4.7 meq/L (ref 3.5–5.1)
Sodium: 134 mEq/L — ABNORMAL LOW (ref 135–145)

## 2017-04-24 NOTE — Patient Instructions (Addendum)
Please remember to go to the lab   department downstairs in the basement  for your tests - we will call you with the results when they are available and I will notify Dr Alain Marion of any further recs

## 2017-04-24 NOTE — Progress Notes (Signed)
Subjective:     Patient ID: Chad [name], male   DOB: [birth year],    MRN: [id number]  HPI  58 yowm quit smoking in 1998 s/p Head and neck surgery 1999 by [facility] and no chemo or RT referred to pulmonary clinic 04/24/2017 by Dr   Chad [name] with fatigue and sob after last admit with pna 02/2016 and "not the right since"     04/24/2017 1st [facility] office visit/ Chad [name]   Chief Complaint  Patient presents with  . Pulmonary Consult    Referred by Dr. Posey [name].  Pt states that he gets tired walking short distances, but not SOB.  He has occ cough with clear sputum.   Ok at at [employer] but can't do more than 10 min s giving out but has had no progression since onset  Able to lie flat / never  sob at rest and more tired than sob with ex and no assoc cough/wheeze with ex/ no exp cp or presyncope.   No obvious day to day or daytime variability or assoc excess/ purulent sputum or mucus plugs or hemoptysis or cp or chest tightness, subjective wheeze or overt sinus or hb symptoms. No unusual exposure hx or h/o childhood pna/ asthma or knowledge of premature birth.  Sleeping  flate ok   without nocturnal  or early am exacerbation  of respiratory  c/o's or need for noct saba. Also denies any obvious fluctuation of symptoms with weather or environmental changes or other aggravating or alleviating factors except as outlined above   Current Allergies, Complete Past Medical History, Past Surgical History, Family History, and Social History were reviewed in [employer] record.  ROS  The following are not active complaints unless bolded Hoarseness, sore throat, dysphagia, dental problems, itching, sneezing,  nasal congestion or discharge of excess mucus or purulent secretions, ear ache,   fever, chills, sweats, unintended wt loss or wt gain, classically pleuritic or exertional cp,  orthopnea pnd or arm/hand swelling  or leg swelling, presyncope, palpitations, abdominal pain,  anorexia, nausea, vomiting, diarrhea  or change in bowel habits or change in bladder habits, change in stools or change in urine, dysuria, hematuria,  rash, arthralgias, visual complaints, headache, numbness, weakness or ataxia or problems with walking or coordination,  change in mood or  memory.        Current Meds  Medication Sig  . cholecalciferol (VITAMIN D) 1000 units tablet Take 1 tablet (1,000 Units total) by mouth daily.  . clidinium-chlordiazePOXIDE (LIBRAX) 5-2.5 MG capsule TAKE 1 CAPSULE BY MOUTH TWICE DAILY  . cyanocobalamin 1000 MCG tablet Take 1,000 mcg by mouth daily as needed (supplement).   [name] diltiazem (CARDIZEM CD) 120 MG 24 hr capsule Take 1 capsule (120 mg total) by mouth at bedtime.  . fluticasone (FLONASE) 50 MCG/ACT nasal spray Place 2 sprays into both nostrils daily.  [name] gabapentin (NEURONTIN) 300 MG capsule Take 3 capsules at bedtime.  . multivitamin-lutein (OCUVITE-LUTEIN) CAPS capsule Take 1 capsule by mouth once a week.   [name] omeprazole (PRILOSEC) 20 MG capsule TAKE 1 CAPSULE DAILY ONE HOUR BEFORE BREAKFAST  . polyethylene glycol powder (GLYCOLAX/MIRALAX) powder Take 17 g by mouth 2 (two) times daily as needed.  . Probiotic Product (ALIGN) 4 MG CAPS Take 1 capsule (4 mg total) by mouth daily. Start to take after you finish Metronidazole  . valsartan (DIOVAN) 80 MG tablet Take 1 tablet (80 mg total) by mouth daily.  Review of Systems     Objective:   Physical Exam Stoic wm nad   Wt Readings from Last 3 Encounters:  04/24/17 187 lb (84.8 kg)  03/11/17 196 lb (88.9 kg)  03/06/17 194 lb 9.6 oz (88.3 kg)     Vital signs reviewed - Note on arrival 02 sats  99% on RA      HEENT: nl   turbinates bilaterally, and oropharynx. Nl external ear canals without cough reflex/ full dentures   NECK :  without JVD/Nodes/TM/ nl carotid upstrokes bilaterally   LUNGS: no acc muscle use,  Nl contour chest which is clear to A and P bilaterally without cough  on insp or exp maneuvers   CV:  RRR  II-III/VI sem  no s3   or increase in P2, and no edema   ABD:  soft and nontender with nl inspiratory excursion in the supine position. No bruits or organomegaly appreciated, bowel sounds nl  MS:  Nl gait/ ext warm without deformities, calf tenderness, cyanosis or clubbing No obvious joint restrictions   SKIN: warm and dry without lesions    NEURO:  alert, approp, nl sensorium with  no motor or cerebellar deficits apparent.        I personally reviewed images and agree with radiology impression as follows:   Chest CT with contrast Scarring/bronchiectasis in the lingula and bilateral lower lobes, likely related to prior infection or aspiration. This accounts for the radiographic appearance.  Trace bilateral pleural effusions, left greater than right, improved.  Mild thoracic and upper abdominal lymphadenopathy, chronic. Reactive lymphadenopathy is possible, although low-grade lymphoproliferative disorder is not excluded.    Labs ordered/ reviewed:      Chemistry      Component Value Date/Time   NA 134 (L) 04/24/2017 1450   NA 137 03/14/2016 1332   K 4.7 04/24/2017 1450   CL 101 04/24/2017 1450   CO2 26 04/24/2017 1450   BUN 24 (H) 04/24/2017 1450   BUN 11 03/14/2016 1332   CREATININE 1.81 (H) 04/24/2017 1450   CREATININE 1.24 (H) 12/06/2015 1549      Component Value Date/Time   CALCIUM 8.7 04/24/2017 1450                                  Lab Results  Component Value Date   WBC 8.1 04/24/2017   HGB 11.8 (L) 04/24/2017   HCT 35.7 (L) 04/24/2017   MCV 85.0 04/24/2017   PLT 238.0 04/24/2017         Lab Results  Component Value Date   TSH 4.31 02/13/2017     Lab Results  Component Value Date   PROBNP 246.0 (H) 04/24/2017                Assessment:

## 2017-04-25 LAB — RESPIRATORY ALLERGY PROFILE REGION II ~~LOC~~
Allergen, A. alternata, m6: <0.1 kU/L
Allergen, Comm Silver Birch, t9: <0.1 kU/L
Allergen, D pternoyssinus,d7: 0.88 kU/L — ABNORMAL HIGH
Allergen, Mouse Urine Protein, e78: <0.1 kU/L
Allergen, Oak,t7: <0.1 kU/L
Bermuda Grass: <0.1 kU/L
Box Elder IgE: <0.1 kU/L
CLADOSPORIUM HERBARUM (M2) IGE: <0.1 kU/L
CLASS: 0
CLASS: 0
CLASS: 0
CLASS: 0
CLASS: 0
CLASS: 0
CLASS: 0
CLASS: 0
CLASS: 0
CLASS: 0
COMMON RAGWEED (SHORT) (W1) IGE: <0.1 kU/L
Class: 0
Class: 0
Class: 0
Class: 0
Class: 0
Class: 0
Class: 0
Class: 0
Class: 0
Class: 0
Class: 0
Class: 1
Class: 2
Class: 2
Cockroach: 0.42 kU/L — ABNORMAL HIGH
D. farinae: 1.94 kU/L — ABNORMAL HIGH
Elm IgE: <0.1 kU/L
IgE (Immunoglobulin E), Serum: 216 kU/L — ABNORMAL HIGH (ref <=114)
Johnson Grass: <0.1 kU/L
Pecan/Hickory Tree IgE: <0.1 kU/L
Timothy Grass: <0.1 kU/L

## 2017-04-25 LAB — INTERPRETATION:

## 2017-04-28 ENCOUNTER — Encounter: Payer: Self-pay | Admitting: Internal Medicine

## 2017-04-28 NOTE — Assessment & Plan Note (Addendum)
Echo 03/08/17  Normal LV size with moderate LV hypertrophy. EF 55-60%. Normal RV   size and systolic function. There was mild to moderate aortic   stenosis (mild by mean gradient, moderate visually and by calculated valve area). - Allergy profile 04/24/2017 >  Eos 0.2 /  IgE  216 pos dust /cockroach  - 04/24/2017  Walked RA x 3 laps @ 185 ft each stopped due to  End of study, slow pace, no   desat - min sob    - Spirometry 04/24/2017 wnl   Symptoms are disproportionate to objective findings and not clear to what extent this is actually a pulmonary  problem but pt does appear to have difficult to sort out respiratory symptoms of unknown origin for which  DDX  = almost all start with A and  include Adherence, Ace Inhibitors, Acid Reflux, Active Sinus Disease, Alpha 1 Antitripsin deficiency, Anxiety masquerading as Airways dz,  ABPA,  Allergy(esp in young), Aspiration (esp in elderly), Adverse effects of meds,  Active smokers, A bunch of PE's/clot burden (a few small clots can't cause this syndrome unless there is already severe underlying pulm or vascular dz with poor reserve),  Anemia or thyroid disorder, plus two Bs  = Bronchiectasis and Beta blocker use..and one C= CHF    Adherence is always the initial "prime suspect" and is a multilayered concern that requires a "trust but verify" approach in every patient - starting with knowing how to use medications, especially inhalers, correctly, keeping up with refills and understanding the fundamental difference between maintenance and prns vs those medications only taken for a very short course and then stopped and not refilled.    ? Allergy / asthma > no noct symptoms or h/o atopy/ rhinitis or variability to ex tol to suggest here   ? ? Anxiety /depression/ deconditioning> usually at the bottom of this list of usual suspects but should be much higher on this pt's based on H and P and note already on psychotropics and may interfere with adherence and also  interpretation of response or lack thereof to symptom management which can be quite subjective.    ? Acid (or non-acid) GERD > always difficult to exclude as up to 75% of pts in some series report no assoc GI/ Heartburn symptoms> rec continue max (24h)  acid suppression and diet restrictions/ reviewed     ? Adverse effects of meds > none of the usual suspects listed   ? Bronchiectasis > def has it on ct chest but not enough of a cough or severe enough to cause any airflow obstruction on spirometry   ? CHF/  AS noted and BNP intermediate so will need to continue to be followed for this but no further pulmonary f/u anticipated - could consider cpst next if symptoms persist   Total time devoted to counseling  > 50 % of initial 60 min office visit:  review case with pt/ discussion of options/alternatives/ personally creating written customized instructions  in presence of pt  then going over those specific  Instructions directly with the pt including how to use all of the meds but in particular covering each new medication in detail and the difference between the maintenance= "automatic" meds and the prns using an action plan format for the latter (If this problem/symptom => do that organization reading Left to right).  Please see AVS from this visit for a full list of these instructions which I personally wrote for this pt and  are unique  to this visit.

## 2017-04-29 ENCOUNTER — Telehealth: Payer: Self-pay | Admitting: Internal Medicine

## 2017-04-29 NOTE — Telephone Encounter (Signed)
4.17.19 lab results per Dr Melvyn Novas: Result Notes for Basic metabolic panel  Notes recorded by Rosana Berger, CMA on 04/29/2017 at 9:01 AM EDT LMTCB ------  Notes recorded by Tanda Rockers, MD on 04/26/2017 at 5:33 AM EDT Call patient :  Studies are c/w allergies to roaches and dust > only rx is avoidance .  Be sure patient has f/u ov so we can go over all the details of this study and get a plan together moving forward - ok to move up f/u if not feeling better and wants to be seen sooner    Called spoke with patient, discussed lab results/recs as stated by MW Pt voiced his understanding Pt was not scheduled for follow up office visit Appt scheduled for 5.20.19 @ 1030 Nothing further needed; will sign off

## 2017-04-29 NOTE — Progress Notes (Signed)
LMTCB

## 2017-04-29 NOTE — Progress Notes (Signed)
Pt aware per 04/29/17 phone note

## 2017-05-22 DIAGNOSIS — K648 Other hemorrhoids: Secondary | ICD-10-CM | POA: Diagnosis not present

## 2017-05-22 DIAGNOSIS — Z8719 Personal history of other diseases of the digestive system: Secondary | ICD-10-CM | POA: Diagnosis not present

## 2017-05-27 ENCOUNTER — Ambulatory Visit (INDEPENDENT_AMBULATORY_CARE_PROVIDER_SITE_OTHER): Payer: Medicare Other | Admitting: Internal Medicine

## 2017-05-27 ENCOUNTER — Encounter: Payer: Self-pay | Admitting: Internal Medicine

## 2017-05-27 VITALS — BP 112/62 | HR 63 | Ht 73.0 in | Wt 191.0 lb

## 2017-05-27 DIAGNOSIS — R05 Cough: Secondary | ICD-10-CM | POA: Diagnosis not present

## 2017-05-27 DIAGNOSIS — R0609 Other forms of dyspnea: Secondary | ICD-10-CM | POA: Diagnosis not present

## 2017-05-27 DIAGNOSIS — J479 Bronchiectasis, uncomplicated: Secondary | ICD-10-CM | POA: Diagnosis not present

## 2017-05-27 DIAGNOSIS — R059 Cough, unspecified: Secondary | ICD-10-CM

## 2017-05-27 MED ORDER — FAMOTIDINE 20 MG PO TABS: ORAL_TABLET | ORAL | 11 refills | Status: DC

## 2017-05-27 MED ORDER — PANTOPRAZOLE SODIUM 40 MG PO TBEC: 40.0000 mg | DELAYED_RELEASE_TABLET | Freq: Every day | ORAL | 2 refills | Status: DC

## 2017-05-27 NOTE — Progress Notes (Signed)
Subjective:     Patient ID: Chad [name], male   DOB: [birth year],    MRN: [id number]   Brief patient profile:  86 yowm quit smoking in 1998 s/p Head and neck surgery 1999 by [facility] and no chemo or RT referred to pulmonary clinic 04/24/2017 by Dr   Posey Rea with fatigue and sob after last admit with pna 02/2016 and "not the right since" dx as bronchiectasis s obstruction.   History of Present Illness  04/24/2017 1st [facility] office visit/ Chad [name]   Chief Complaint  Patient presents with  . Pulmonary Consult    Referred by Dr. Posey Rea.  Pt states that he gets tired walking short distances, but not SOB.  He has occ cough with clear sputum.   Ok at at [employer] but can't do more than 10 min s giving out but has had no progression since onset  Able to lie flat / never  sob at rest and more tired than sob with ex and no assoc cough/wheeze with ex/ no exp cp or presyncope. rec Check labs > note bnp 246   05/27/2017  f/u ov/Chad [name] re: bronchiectasis  Chief Complaint  Patient presents with  . Follow-up    Breathing is unchanged. No new co's.   Dyspnea:  More limited by legs than breathing Cough: mostly daytime/ no related to meals / dry hacky variably severe not related to weather or season  Sleep: disturbed by [place] dreams not breathing  No obvious day to day or daytime variability or assoc excess/ purulent sputum or mucus plugs or hemoptysis or cp or chest tightness, subjective wheeze or overt sinus or hb symptoms. No unusual exposure hx or h/o childhood pna/ asthma or knowledge of premature birth.  Sleeping  Flat   without nocturnal  or early am exacerbation  of respiratory  c/o's or need for noct saba. Also denies any obvious fluctuation of symptoms with weather or environmental changes or other aggravating or alleviating factors except as outlined above   Current Allergies, Complete Past Medical History, Past Surgical History, Family History, and Social History were reviewed in  [employer] record.  ROS  The following are not active complaints unless bolded Hoarseness, sore throat, dysphagia, dental problems, itching, sneezing,  nasal congestion or discharge of excess mucus or purulent secretions, ear ache,   fever, chills, sweats, unintended wt loss or wt gain, classically pleuritic or exertional cp,  orthopnea pnd or arm/hand swelling  or leg swelling, presyncope, palpitations, abdominal pain, anorexia, nausea, vomiting, diarrhea  or change in bowel habits or change in bladder habits, change in stools or change in urine, dysuria, hematuria,  rash, arthralgias, visual complaints, headache, numbness, weakness or ataxia or problems with walking or coordination,  change in mood= depressed  or  memory.        Current Meds  Medication Sig  . cholecalciferol (VITAMIN D) 1000 units tablet Take 1 tablet (1,000 Units total) by mouth daily.  . clidinium-chlordiazePOXIDE (LIBRAX) 5-2.5 MG capsule TAKE 1 CAPSULE BY MOUTH TWICE DAILY  . cyanocobalamin 1000 MCG tablet Take 1,000 mcg by mouth daily as needed (supplement).   [name] diltiazem (CARDIZEM CD) 120 MG 24 hr capsule Take 1 capsule (120 mg total) by mouth at bedtime.  . fluticasone (FLONASE) 50 MCG/ACT nasal spray Place 2 sprays into both nostrils daily.  [name] gabapentin (NEURONTIN) 300 MG capsule Take 3 capsules at bedtime.  . multivitamin-lutein (OCUVITE-LUTEIN) CAPS capsule Take 1 capsule by mouth daily.   [name]  polyethylene glycol powder (GLYCOLAX/MIRALAX) powder Take 17 g by mouth 2 (two) times daily as needed.  . Probiotic Product (ALIGN) 4 MG CAPS Take 1 capsule (4 mg total) by mouth daily. Start to take after you finish Metronidazole  . valsartan (DIOVAN) 80 MG tablet Take 1 tablet (80 mg total) by mouth daily.  . [DISCONTINUED] omeprazole (PRILOSEC) 20 MG capsule TAKE 1 CAPSULE DAILY ONE HOUR BEFORE BREAKFAST                  Objective:  Physical Exam   amb wm walking with cane   05/27/2017         191  04/24/17 187 lb (84.8 kg)  03/11/17 196 lb (88.9 kg)  03/06/17 194 lb 9.6 oz (88.3 kg)     Vital signs reviewed - Note on arrival 02 sats  95% on RA       HEENT: full dentures  nl , turbinates bilaterally, and oropharynx. Nl external ear canals without cough reflex   NECK :  without JVD/Nodes/TM/ nl carotid upstrokes bilaterally   LUNGS: no acc muscle use,  Nl contour chest with a few insp rhonchi L base  without cough on insp or exp maneuvers   CV:  RRR  II-III/VI sem   no s3 or murmur or increase in P2, and no edema   ABD:  soft and nontender with nl inspiratory excursion in the supine position. No bruits or organomegaly appreciated, bowel sounds nl  MS:  Nl gait/ ext warm without deformities, calf tenderness, cyanosis or clubbing No obvious joint restrictions   SKIN: warm and dry without lesions    NEURO:  alert, approp, nl sensorium with  no motor or cerebellar deficits apparent.                         Assessment:

## 2017-05-27 NOTE — Patient Instructions (Addendum)
Try taking the Gabapentin 300 mg with bfast, supper and bedtime to see if it helps with your cough or your bad dreams   Stop prilosec (omerazole)  and take Pantoprazole (protonix) 40 mg   Take  30-60 min before first meal of the day and Pepcid (famotidine)  20 mg one after supper  until return to office - this is the best way to tell whether stomach acid is contributing to your problem.     GERD (REFLUX)  is an extremely common cause of respiratory symptoms just like yours , many times with no obvious heartburn at all.    It can be treated with medication, but also with lifestyle changes including elevation of the head of your bed (ideally with 6 inch  bed blocks),  Smoking cessation, avoidance of late meals, excessive alcohol, and avoid fatty foods, chocolate, peppermint, colas, red wine, and acidic juices such as orange juice.  NO MINT OR MENTHOL PRODUCTS SO NO COUGH DROPS  USE SUGARLESS CANDY INSTEAD (Jolley ranchers or Stover's or Life Savers) or even ice chips will also do - the key is to swallow to prevent all throat clearing. NO OIL BASED VITAMINS - use powdered substitutes.      If you are satisfied with your treatment plan,  let your doctor know and he/she can either refill your medications or you can return here when your prescription runs out.     If in any way you are not 100% satisfied,  please tell us.  If 100% better, tell your friends!  Pulmonary follow up is as needed

## 2017-05-29 ENCOUNTER — Encounter: Payer: Self-pay | Admitting: Internal Medicine

## 2017-05-29 DIAGNOSIS — J479 Bronchiectasis, uncomplicated: Secondary | ICD-10-CM | POA: Insufficient documentation

## 2017-05-29 NOTE — Assessment & Plan Note (Signed)
CT chest  03/21/17   Scarring/bronchiectasis in the lingula and bilateral lower lobes, likely related to prior infection or aspiration. This accounts for the radiographic appearance. Trace bilateral pleural effusions, left greater than right, improved. Mild thoracic and upper abdominal lymphadenopathy, chronic. Reactive lymphadenopathy is possible, although low-grade lymphoproliferative disorder is not excluded   Advised re dx of bronchiectasis using the elevator analogy/ pulmonary f/u can be prn flare

## 2017-05-29 NOTE — Assessment & Plan Note (Signed)
Echo 03/08/17  Normal LV size with moderate LV hypertrophy. EF 55-60%. Normal RV   size and systolic function. There was mild to moderate aortic   stenosis (mild by mean gradient, moderate visually and by calculated valve area). - Allergy profile 04/24/2017 >  Eos 0.2 /  IgE  216 pos dust /cockroach  - 04/24/2017  Walked RA x 3 laps @ 185 ft each stopped due to  End of study, slow pace, no   desat - min sob    - Spirometry 04/24/2017 wnl  At this point more limited by legs than breathing so really not a candidate for CPST > needs cards f/u for AS but here prn     Each maintenance medication was reviewed in detail including most importantly the difference between maintenance and as needed and under what circumstances the prns are to be used. This was done in the context of a medication calendar review which provided the patient with a user-friendly unambiguous mechanism for medication administration and reconciliation and provides an action plan for all active problems. It is critical that this be shown to every doctor  for modification during the office visit if necessary so the patient can use it as a working document.

## 2017-05-29 NOTE — Assessment & Plan Note (Signed)
Most c/w UACS   Upper airway cough syndrome (previously labeled PNDS),  is so named because it's frequently impossible to sort out how much is  CR/sinusitis with freq throat clearing (which can be related to primary GERD)   vs  causing  secondary (" extra esophageal")  GERD from wide swings in gastric pressure that occur with throat clearing, often  promoting self use of mint and menthol lozenges that reduce the lower esophageal sphincter tone and exacerbate the problem further in a cyclical fashion.   These are the same pts (now being labeled as having "irritable larynx syndrome" by some cough centers) who not infrequently have a history of having failed to tolerate ace inhibitors,  dry powder inhalers or biphosphonates or report having atypical/extraesophageal reflux symptoms that don't respond to standard doses of PPI  and are easily confused as having aecopd or asthma flares by even experienced allergists/ pulmonologists (myself included).    rec max rx for gerd and try spreading out the gabapentin during the day - this may reduce the cns effects he's noting from taking it all at once  Pulmonary f/u is prn

## 2017-06-03 ENCOUNTER — Encounter (HOSPITAL_COMMUNITY): Payer: Self-pay

## 2017-06-03 ENCOUNTER — Observation Stay (HOSPITAL_COMMUNITY): Payer: Medicare Other

## 2017-06-03 ENCOUNTER — Emergency Department (HOSPITAL_COMMUNITY): Payer: Medicare Other

## 2017-06-03 ENCOUNTER — Other Ambulatory Visit: Payer: Self-pay

## 2017-06-03 ENCOUNTER — Inpatient Hospital Stay (HOSPITAL_COMMUNITY)
Admission: EM | Admit: 2017-06-03 | Discharge: 2017-06-05 | DRG: 065 | Disposition: A | Discharge status: Home Health | Payer: Medicare Other | Attending: Internal Medicine | Admitting: Internal Medicine

## 2017-06-03 DIAGNOSIS — I4892 Unspecified atrial flutter: Secondary | ICD-10-CM | POA: Diagnosis not present

## 2017-06-03 DIAGNOSIS — E1122 Type 2 diabetes mellitus with diabetic chronic kidney disease: Secondary | ICD-10-CM | POA: Diagnosis present

## 2017-06-03 DIAGNOSIS — I63511 Cerebral infarction due to unspecified occlusion or stenosis of right middle cerebral artery: Secondary | ICD-10-CM | POA: Diagnosis not present

## 2017-06-03 DIAGNOSIS — J8410 Pulmonary fibrosis, unspecified: Secondary | ICD-10-CM | POA: Diagnosis not present

## 2017-06-03 DIAGNOSIS — R402142 Coma scale, eyes open, spontaneous, at arrival to emergency department: Secondary | ICD-10-CM | POA: Diagnosis present

## 2017-06-03 DIAGNOSIS — N184 Chronic kidney disease, stage 4 (severe): Secondary | ICD-10-CM | POA: Diagnosis not present

## 2017-06-03 DIAGNOSIS — R471 Dysarthria and anarthria: Secondary | ICD-10-CM | POA: Diagnosis present

## 2017-06-03 DIAGNOSIS — I6340 Cerebral infarction due to embolism of unspecified cerebral artery: Secondary | ICD-10-CM

## 2017-06-03 DIAGNOSIS — I10 Essential (primary) hypertension: Secondary | ICD-10-CM | POA: Diagnosis present

## 2017-06-03 DIAGNOSIS — I639 Cerebral infarction, unspecified: Secondary | ICD-10-CM | POA: Diagnosis not present

## 2017-06-03 DIAGNOSIS — R402252 Coma scale, best verbal response, oriented, at arrival to emergency department: Secondary | ICD-10-CM | POA: Diagnosis present

## 2017-06-03 DIAGNOSIS — K589 Irritable bowel syndrome without diarrhea: Secondary | ICD-10-CM | POA: Diagnosis present

## 2017-06-03 DIAGNOSIS — I350 Nonrheumatic aortic (valve) stenosis: Secondary | ICD-10-CM | POA: Diagnosis present

## 2017-06-03 DIAGNOSIS — G459 Transient cerebral ischemic attack, unspecified: Secondary | ICD-10-CM | POA: Diagnosis not present

## 2017-06-03 DIAGNOSIS — Z85819 Personal history of malignant neoplasm of unspecified site of lip, oral cavity, and pharynx: Secondary | ICD-10-CM

## 2017-06-03 DIAGNOSIS — N189 Chronic kidney disease, unspecified: Secondary | ICD-10-CM | POA: Diagnosis present

## 2017-06-03 DIAGNOSIS — J449 Chronic obstructive pulmonary disease, unspecified: Secondary | ICD-10-CM | POA: Diagnosis present

## 2017-06-03 DIAGNOSIS — K219 Gastro-esophageal reflux disease without esophagitis: Secondary | ICD-10-CM | POA: Diagnosis present

## 2017-06-03 DIAGNOSIS — Z86010 Personal history of colonic polyps: Secondary | ICD-10-CM

## 2017-06-03 DIAGNOSIS — R402362 Coma scale, best motor response, obeys commands, at arrival to emergency department: Secondary | ICD-10-CM | POA: Diagnosis present

## 2017-06-03 DIAGNOSIS — I5032 Chronic diastolic (congestive) heart failure: Secondary | ICD-10-CM | POA: Diagnosis not present

## 2017-06-03 DIAGNOSIS — Z87891 Personal history of nicotine dependence: Secondary | ICD-10-CM

## 2017-06-03 DIAGNOSIS — I130 Hypertensive heart and chronic kidney disease with heart failure and stage 1 through stage 4 chronic kidney disease, or unspecified chronic kidney disease: Secondary | ICD-10-CM | POA: Diagnosis present

## 2017-06-03 DIAGNOSIS — N179 Acute kidney failure, unspecified: Secondary | ICD-10-CM | POA: Diagnosis not present

## 2017-06-03 DIAGNOSIS — R2990 Unspecified symptoms and signs involving the nervous system: Secondary | ICD-10-CM | POA: Diagnosis not present

## 2017-06-03 DIAGNOSIS — E785 Hyperlipidemia, unspecified: Secondary | ICD-10-CM | POA: Diagnosis present

## 2017-06-03 DIAGNOSIS — R29703 NIHSS score 3: Secondary | ICD-10-CM | POA: Diagnosis present

## 2017-06-03 DIAGNOSIS — R531 Weakness: Secondary | ICD-10-CM | POA: Diagnosis not present

## 2017-06-03 DIAGNOSIS — Z888 Allergy status to other drugs, medicaments and biological substances status: Secondary | ICD-10-CM

## 2017-06-03 DIAGNOSIS — J479 Bronchiectasis, uncomplicated: Secondary | ICD-10-CM

## 2017-06-03 LAB — I-STAT CHEM 8, ED
BUN: 18 mg/dL (ref 6–20)
CHLORIDE: 102 mmol/L (ref 101–111)
Calcium, Ion: 1.11 mmol/L — ABNORMAL LOW (ref 1.15–1.40)
Creatinine, Ser: 2.1 mg/dL — ABNORMAL HIGH (ref 0.61–1.24)
Glucose, Bld: 88 mg/dL (ref 65–99)
HEMATOCRIT: 34 % — AB (ref 39.0–52.0)
Hemoglobin: 11.6 g/dL — ABNORMAL LOW (ref 13.0–17.0)
Potassium: 4.6 mmol/L (ref 3.5–5.1)
SODIUM: 137 mmol/L (ref 135–145)
TCO2: 20 mmol/L — AB (ref 22–32)

## 2017-06-03 LAB — PROTIME-INR
INR: 1.06
PROTHROMBIN TIME: 13.7 s (ref 11.4–15.2)

## 2017-06-03 LAB — CBC
HCT: 34.2 % — ABNORMAL LOW (ref 39.0–52.0)
Hemoglobin: 11 g/dL — ABNORMAL LOW (ref 13.0–17.0)
MCH: 27.6 pg (ref 26.0–34.0)
MCHC: 32.2 g/dL (ref 30.0–36.0)
MCV: 85.9 fL (ref 78.0–100.0)
PLATELETS: 221 10*3/uL (ref 150–400)
RBC: 3.98 MIL/uL — ABNORMAL LOW (ref 4.22–5.81)
RDW: 14.8 % (ref 11.5–15.5)
WBC: 9.7 10*3/uL (ref 4.0–10.5)

## 2017-06-03 LAB — I-STAT TROPONIN, ED: Troponin i, poc: 0.06 ng/mL (ref 0.00–0.08)

## 2017-06-03 LAB — COMPREHENSIVE METABOLIC PANEL
ALBUMIN: 3.1 g/dL — AB (ref 3.5–5.0)
ALT: 12 U/L — ABNORMAL LOW (ref 17–63)
ANION GAP: 8 (ref 5–15)
AST: 21 U/L (ref 15–41)
Alkaline Phosphatase: 59 U/L (ref 38–126)
BUN: 17 mg/dL (ref 6–20)
CO2: 22 mmol/L (ref 22–32)
Calcium: 8.5 mg/dL — ABNORMAL LOW (ref 8.9–10.3)
Chloride: 104 mmol/L (ref 101–111)
Creatinine, Ser: 2.3 mg/dL — ABNORMAL HIGH (ref 0.61–1.24)
GFR calc non Af Amer: 24 mL/min — ABNORMAL LOW (ref >60)
GFR, EST AFRICAN AMERICAN: 28 mL/min — AB (ref >60)
GLUCOSE: 93 mg/dL (ref 65–99)
Potassium: 4.6 mmol/L (ref 3.5–5.1)
SODIUM: 134 mmol/L — AB (ref 135–145)
Total Bilirubin: 0.4 mg/dL (ref 0.3–1.2)
Total Protein: 6.7 g/dL (ref 6.5–8.1)

## 2017-06-03 LAB — DIFFERENTIAL
ABS IMMATURE GRANULOCYTES: 0.1 10*3/uL (ref 0.0–0.1)
BASOS PCT: 1 %
Basophils Absolute: 0.1 10*3/uL (ref 0.0–0.1)
EOS ABS: 0.2 10*3/uL (ref 0.0–0.7)
EOS PCT: 3 %
IMMATURE GRANULOCYTES: 1 %
Lymphocytes Relative: 15 %
Lymphs Abs: 1.5 10*3/uL (ref 0.7–4.0)
MONO ABS: 0.8 10*3/uL (ref 0.1–1.0)
Monocytes Relative: 8 %
Neutro Abs: 7.1 10*3/uL (ref 1.7–7.7)
Neutrophils Relative %: 72 %

## 2017-06-03 LAB — APTT: APTT: 31 s (ref 24–36)

## 2017-06-03 MED ORDER — FLUTICASONE PROPIONATE 50 MCG/ACT NA SUSP
2.0000 | Freq: Every day | NASAL | Status: DC | PRN
  Filled 2017-06-03: qty 16

## 2017-06-03 MED ORDER — ATORVASTATIN CALCIUM 40 MG PO TABS
40.0000 mg | ORAL_TABLET | Freq: Every day | ORAL | Status: DC
  Administered 2017-06-04: 40 mg via ORAL
  Filled 2017-06-03: qty 1

## 2017-06-03 MED ORDER — PANTOPRAZOLE SODIUM 40 MG PO TBEC
40.0000 mg | DELAYED_RELEASE_TABLET | Freq: Every day | ORAL | Status: DC
  Administered 2017-06-04 – 2017-06-05 (×2): 40 mg via ORAL
  Filled 2017-06-03 (×2): qty 1

## 2017-06-03 MED ORDER — SENNOSIDES-DOCUSATE SODIUM 8.6-50 MG PO TABS: 1.0000 | ORAL_TABLET | Freq: Every evening | ORAL | Status: DC | PRN

## 2017-06-03 MED ORDER — VITAMIN B-12 1000 MCG PO TABS: 1000.0000 ug | ORAL_TABLET | Freq: Every day | ORAL | Status: DC | PRN

## 2017-06-03 MED ORDER — ENOXAPARIN SODIUM 30 MG/0.3ML ~~LOC~~ SOLN
30.0000 mg | SUBCUTANEOUS | Status: DC
  Administered 2017-06-03: 30 mg via SUBCUTANEOUS
  Filled 2017-06-03: qty 0.3

## 2017-06-03 MED ORDER — DILTIAZEM HCL ER COATED BEADS 120 MG PO CP24
120.0000 mg | ORAL_CAPSULE | Freq: Every day | ORAL | Status: DC
Start: 2017-06-03 — End: 2017-06-03

## 2017-06-03 MED ORDER — FAMOTIDINE 20 MG PO TABS
20.0000 mg | ORAL_TABLET | Freq: Every day | ORAL | Status: DC
  Administered 2017-06-03 – 2017-06-04 (×2): 20 mg via ORAL
  Filled 2017-06-03 (×2): qty 1

## 2017-06-03 MED ORDER — POLYETHYLENE GLYCOL 3350 17 GM/SCOOP PO POWD: 17.0000 g | Freq: Two times a day (BID) | ORAL | Status: DC | PRN

## 2017-06-03 MED ORDER — ASPIRIN 81 MG PO CHEW
81.0000 mg | CHEWABLE_TABLET | Freq: Once | ORAL | Status: AC
  Administered 2017-06-03: 81 mg via ORAL
  Filled 2017-06-03: qty 1

## 2017-06-03 MED ORDER — POLYETHYLENE GLYCOL 3350 17 G PO PACK
17.0000 g | PACK | Freq: Two times a day (BID) | ORAL | Status: DC | PRN
Start: 2017-06-03 — End: 2017-06-05

## 2017-06-03 MED ORDER — IRBESARTAN 75 MG PO TABS: 75.0000 mg | ORAL_TABLET | Freq: Every day | ORAL | Status: DC

## 2017-06-03 MED ORDER — STROKE: EARLY STAGES OF RECOVERY BOOK
Freq: Once | Status: DC
  Filled 2017-06-03: qty 1

## 2017-06-03 MED ORDER — VITAMIN D 1000 UNITS PO TABS
1000.0000 [IU] | ORAL_TABLET | Freq: Every day | ORAL | Status: DC
  Administered 2017-06-04 – 2017-06-05 (×2): 1000 [IU] via ORAL
  Filled 2017-06-03 (×2): qty 1

## 2017-06-03 MED ORDER — CILIDINIUM-CHLORDIAZEPOXIDE 2.5-5 MG PO CAPS
1.0000 | ORAL_CAPSULE | Freq: Two times a day (BID) | ORAL | Status: DC
  Administered 2017-06-04 – 2017-06-05 (×3): 1 via ORAL
  Filled 2017-06-03 (×5): qty 1

## 2017-06-03 MED ORDER — GABAPENTIN 300 MG PO CAPS
300.0000 mg | ORAL_CAPSULE | Freq: Every day | ORAL | Status: DC
  Administered 2017-06-03 – 2017-06-04 (×2): 300 mg via ORAL
  Filled 2017-06-03 (×2): qty 1

## 2017-06-03 NOTE — ED Triage Notes (Signed)
Pt presents for evaluation of weakness to L arm. Last known well was last night. Patient reports symptoms worsened around 11 today. Pt reports feeling numbness and uncoordinated with L arm. Small drift noted. No other abnormalities.

## 2017-06-03 NOTE — H&P (Signed)
History and Physical    Chad [name] ZOX:[id number] DOB: 1932/01/02 DOA: 06/03/2017  PCP: Tresa Garter, MD Patient coming from: home  Chief Complaint: left arm weakness  HPI: Chad [name] is a delightful 84 y.o. male with medical history significant for a flutter status post ablation 2014 not on anticoagulation, hypertension, COPD not on home oxygen,aortic stenosis, presents to emergency department with the chief complaint left arm weakness. [facility] are asked to admit for stroke workup.  Information is obtained from the patient and the chart. atient reports being in his usual state of health until this morning he was driving [place] the [place] in his left arm began to "feel funny". Once he arrived to the golf course he got out of the car and ambulated to a nearby perinatal table. He has water bottle with him and attempted to take a drink of water using his left hand. He reports "my left arm wasn't working right". He reports not being able to get the water bottle to his mouth. He describes lack of control over that left arm. He denies any pain numbness tingling. He denies headache dizziness syncope or near-syncope. He denies any visual disturbances difficulty chewing swallowing. He denies any pain in that arm. He denies any weakness numbness tingling of his left leg. He denies chest pain palpitations no worsening shortness of breath No abdominal pain nausea vomiting. No recent diarrhea constipation melena bright red blood per rectum. No complaints of dysuria hematuria frequency or urgency    ED Course: in the emergency department he's afebrile hemodynamically stable and not hypoxic.  Review of Systems: As per HPI otherwise all other systems reviewed and are negative.   Ambulatory Status: at home with his wife. Ambulates with a cane. No recent falls  Past Medical History:  Diagnosis Date  . Abnormal EKG    ST changes with normal coronaries by cath 05/06/12.  . Arthritis     "hands" (01/12/2013); feet (podiatry consultation)  . Atrial flutter (HCC)    a. Slow ~100bpm when in 2:1; dx 04/2012. b. Placed on Xarelto.  c. s/p ablation 07-08-2012 by Dr Johney Frame  . Atrial tachycardia (HCC)   . Carotid artery calcification    By CXR - dopplers 05/06/12 without obvious evidence of significant ICA stenosis >40%  . Colon polyps 06/26/2005  . COPD (chronic obstructive pulmonary disease) (HCC)   . Diverticulosis of colon (without mention of hemorrhage)   . Dysrhythmia   . Gastritis, chronic    Pt denies bleeding 04/2012. EGD 2012 reportedly normal.  . GERD (gastroesophageal reflux disease)   . Hypertension   . Hyponatremia    04/2012 r/t diuretic.  . IBS (irritable bowel syndrome)   . Internal and external hemorrhoids without complication   . Macular degeneration of left eye   . 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 Pneumonia    "couple times" (01/12/2013)  . Shortness of breath dyspnea    walking distances    Past Surgical History:  Procedure Laterality Date  . ATRIAL FLUTTER ABLATION  07-08-2012   of typical atrial flutter by Dr Johney Frame  . ATRIAL FLUTTER ABLATION N/A 07/08/2012   Procedure: ATRIAL FLUTTER ABLATION;  Surgeon: Hillis Range, MD;  Location[facility] CATH LAB;  Service: Cardiovascular;  Laterality: N/A;  . CARDIAC CATHETERIZATION  04/2012  . COLONOSCOPY  08/29/10   diverticulosis, internal hemorrhoids  . ESOPHAGOGASTRODUODENOSCOPY  09/20/10   normal  . EYE SURGERY  01/08/2014   Cataract resection L.    [name]  HEMORRHOID SURGERY  1968  . LEFT HEART CATHETERIZATION WITH CORONARY ANGIOGRAM N/A 05/06/2012   Procedure: LEFT HEART CATHETERIZATION WITH CORONARY ANGIOGRAM;  Surgeon: Peter M Swaziland, MD;  Location: [facility] CATH LAB;  Service: Cardiovascular;  Laterality: N/A;  . 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 TRIGGER FINGER RELEASE Right 1970's   "2 fingers"  . WART FULGURATION Left 09/15/2015   Procedure: EXCISIONal biospy of left peri anual and anual canal mass;   Surgeon: Gaynelle Adu, MD;  Location: [facility] ORS;  Service: General;  Laterality: Left;    Social History   Socioeconomic History  . Marital status: Divorced    Spouse name: Not on file  . Number of children: 2  . Years of education: 18  . Highest education level: Not on file  Occupational History  . Occupation: Retired  Engineer, production  . Financial resource strain: Not on file  . Food insecurity:    Worry: Not on file    Inability: Not on file  . Transportation needs:    Medical: Not on file    Non-medical: Not on file  Tobacco Use  . Smoking status: Former Smoker    Packs/day: 0.50    Years: 20.00    Pack years: 10.00    Types: Cigarettes, Pipe, Cigars    Last attempt to quit: 01/09/1996    Years since quitting: 21.4  . Smokeless tobacco: Never Used  . Tobacco comment:    Substance and Sexual Activity  . Alcohol use: No    Alcohol/week: 0.0 oz    Frequency: Never    Comment: 01/12/2013 "did drink 2-3 12 oz beers a day; stopped drinking in 04/2012"  . Drug use: No  . Sexual activity: Never  Lifestyle  . Physical activity:    Days per week: Not on file    Minutes per session: Not on file  . Stress: Not on file  Relationships  . Social connections:    Talks on phone: Not on file    Gets together: Not on file    Attends religious service: Not on file    Active member of club or organization: Not on file    Attends meetings of clubs or organizations: Not on file    Relationship status: Not on file  . Intimate partner violence:    Fear of current or ex partner: Not on file    Emotionally abused: Not on file    Physically abused: Not on file    Forced sexual activity: Not on file  Other Topics Concern  . Not on file  Social History Narrative   Marital status: divorced; lives with ex-wife; not dating in 2017.      Children:  2 sons, 2 grandsons, 1 granddaughter; no gg.      Lives: with ex-wife in house.      Employment: retired first time age 29; retired in 1997.  Corporate treasurer x 25 years; Research officer, political [employer].      Tobacco: quit smoking 1997; smoked for 27 years.      Alcohol: quit 2004      Exercise:  Walking daily short distances.       ADLs:  NO assistant devices; has cane if needs it; drives; pays bill; grocery shopping by wife; wife cleans house and does laundry.     Advanced Directives:  +LIVING WILL; +FULL CODE.  HCPOA: oldest son Seif Baul Sr.)    Allergies  Allergen Reactions  . Dyazide [Hydrochlorothiazide  W-Triamterene] Other (See Comments)    Caused hyponatremia 04/2012  . Lisinopril     cough    Family History  Problem Relation Age of Onset  . Heart disease Brother   . Throat cancer Brother   . Other Mother        UNSURE  . Other Father        UNSURE  . Emphysema Sister   . Emphysema Sister   . Other Brother        UNSURE  . Diabetes Unknown     Prior to Admission medications   Medication Sig Start Date End Date Taking? Authorizing Provider  cholecalciferol (VITAMIN D) 1000 units tablet Take 1 tablet (1,000 Units total) by mouth daily. 10/15/16   Plotnikov, Georgina Quint, MD  clidinium-chlordiazePOXIDE (LIBRAX) 5-2.5 MG capsule TAKE 1 CAPSULE BY MOUTH TWICE DAILY 04/01/17   Plotnikov, Georgina Quint, MD  cyanocobalamin 1000 MCG tablet Take 1,000 mcg by mouth daily as needed (supplement).     [provider]  diltiazem (CARDIZEM CD) 120 MG 24 hr capsule Take 1 capsule (120 mg total) by mouth at bedtime. 03/11/17   Plotnikov, Georgina Quint, MD  famotidine (PEPCID) 20 MG tablet One at bedtime 05/27/17   Nyoka Cowden, MD  fluticasone [facility]) 50 MCG/ACT nasal spray Place 2 sprays into both nostrils daily. 07/22/16   Plotnikov, Georgina Quint, MD  gabapentin (NEURONTIN) 300 MG capsule Take 3 capsules at bedtime. 07/22/16   Plotnikov, Georgina Quint, MD  multivitamin-lutein (OCUVITE-LUTEIN) CAPS capsule Take 1 capsule by mouth daily.     [provider]  pantoprazole (PROTONIX) 40 MG tablet Take 1 tablet (40 mg total) by mouth daily.  Take 30-60 min before first meal of the day 05/27/17   Nyoka Cowden, MD  polyethylene glycol powder (GLYCOLAX/MIRALAX) powder Take 17 g by mouth 2 (two) times daily as needed. 03/14/16   Ethelda Chick, MD  Probiotic Product (ALIGN) 4 MG CAPS Take 1 capsule (4 mg total) by mouth daily. Start to take after you finish Metronidazole 02/13/17   Plotnikov, Georgina Quint, MD  valsartan (DIOVAN) 80 MG tablet Take 1 tablet (80 mg total) by mouth daily. 03/12/17   Lewayne Bunting, MD    Physical Exam: Vitals:   06/03/17 1233 06/03/17 1421 06/03/17 1453  BP: 132/61 (!) 141/64 (!) 163/71  Pulse: (!) 59 (!) 59 64  Resp: 18 13 14   Temp: 98.2 F (36.8 C)    TempSrc: Oral    SpO2: 96% 99% 100%     General:  Appears calm and comfortable in no acute distress Eyes:  PERRL, EOMI, normal lids, iris ENT:  grossly normal hearing, lips & tongue, mucous membranes of his mouth are moist and pink Neck:  no LAD, masses or thyromegaly Cardiovascular:  Regularly irregular, no m/r/g. No LE edema.  Respiratory:  CTA bilaterally, no w/r/r. Normal respiratory effort. Abdomen:  soft, ntnd, ositive bowel sounds throughout no guarding or rebounding Skin:  no rash or induration seen on limited exam Musculoskeletal:  grossly normal tone BUE/BLE, good ROM, no bony abnormality Psychiatric:  grossly normal mood and affect, speech fluent and appropriate, AOx3 Neurologic:  CN 2-12 grossly intact, moves all extremities in coordinated fashion, sensation intact bilateral grip 5 out of 5. Bilateral lower extremity strength 5 out of 5 speech clear facial symmetry tongue midline, mild pronator drift  Labs on Admission: I have personally reviewed following labs and imaging studies  CBC: Recent Labs  Lab 06/03/17 1249 06/03/17  1256  WBC 9.7  --   NEUTROABS 7.1  --   HGB 11.0* 11.6*  HCT 34.2* 34.0*  MCV 85.9  --   PLT 221  --    Basic Metabolic Panel: Recent Labs  Lab 06/03/17 1249 06/03/17 1256  NA 134* 137  K 4.6 4.6    CL 104 102  CO2 22  --   GLUCOSE 93 88  BUN 17 18  CREATININE 2.30* 2.10*  CALCIUM 8.5*  --    GFR: Estimated Creatinine Clearance: 29.6 mL/min (A) (by C-G formula based on SCr of 2.1 mg/dL (H)). Liver Function Tests: Recent Labs  Lab 06/03/17 1249  AST 21  ALT 12*  ALKPHOS 59  BILITOT 0.4  PROT 6.7  ALBUMIN 3.1*   No results for input(s): LIPASE, AMYLASE in the last 168 hours. No results for input(s): AMMONIA in the last 168 hours. Coagulation Profile: Recent Labs  Lab 06/03/17 1249  INR 1.06   Cardiac Enzymes: No results for input(s): CKTOTAL, CKMB, CKMBINDEX, TROPONINI in the last 168 hours. BNP (last 3 results) Recent Labs    04/24/17 1450  PROBNP 246.0*   HbA1C: No results for input(s): HGBA1C in the last 72 hours. CBG: No results for input(s): GLUCAP in the last 168 hours. Lipid Profile: No results for input(s): CHOL, HDL, LDLCALC, TRIG, CHOLHDL, LDLDIRECT in the last 72 hours. Thyroid Function Tests: No results for input(s): TSH, T4TOTAL, FREET4, T3FREE, THYROIDAB in the last 72 hours. Anemia Panel: No results for input(s): VITAMINB12, FOLATE, FERRITIN, TIBC, IRON, RETICCTPCT in the last 72 hours. Urine analysis:    Component Value Date/Time   COLORURINE YELLOW 04/24/2016 0728   APPEARANCEUR CLEAR 04/24/2016 0728   LABSPEC 1.010 04/24/2016 0728   PHURINE 7.5 04/24/2016 0728   GLUCOSEU NEGATIVE 04/24/2016 0728   HGBUR NEGATIVE 04/24/2016 0728   BILIRUBINUR NEGATIVE 04/24/2016 0728   BILIRUBINUR negative 05/31/2015 0910   BILIRUBINUR Negative 04/28/2014 1516   KETONESUR NEGATIVE 04/24/2016 0728   PROTEINUR NEGATIVE 11/22/2015 0633   UROBILINOGEN 0.2 04/24/2016 0728   NITRITE NEGATIVE 04/24/2016 0728   LEUKOCYTESUR TRACE (A) 04/24/2016 0728    Creatinine Clearance: Estimated Creatinine Clearance: 29.6 mL/min (A) (by C-G formula based on SCr of 2.1 mg/dL (H)).  Sepsis Labs: @LABRCNTIP (procalcitonin:4,lacticidven:4) )No results found for this  or any previous visit (from the past 240 hour(s)).   Radiological Exams on Admission: Ct Head Wo Contrast  Result Date: 06/03/2017 CLINICAL DATA:  Left upper extremity weakness EXAM: CT HEAD WITHOUT CONTRAST TECHNIQUE: Contiguous axial images were obtained from the base of the skull through the vertex without intravenous contrast. COMPARISON:  None. FINDINGS: Brain: There is mild diffuse atrophy. There is no intracranial mass, hemorrhage, extra-axial fluid collection, or midline shift. There is evidence of a prior small infarct in medial superior right frontal lobe. There is patchy small vessel disease in the centra semiovale bilaterally. Elsewhere gray-white compartments appear normal. Vascular: No hyperdense vessels are evident. There is calcification each carotid siphon region and distal vertebral artery. Skull: The bony calvarium appears intact. Sinuses/Orbits: There is mucosal thickening in multiple ethmoid air cells with opacification of anterior ethmoid air cells on the left. Other visualized paranasal sinuses are clear. There is an old healed fracture of the right nasal bone. Orbits appear symmetric bilaterally except for evidence of previous cataract resection on the left. Other: Visualized mastoid air cells are clear. IMPRESSION: Atrophy with periventricular small vessel disease. Prior infarct medial superior right frontal lobe. No acute infarct demonstrable. No mass  or hemorrhage evident. Foci arterial vascular calcification. Areas of paranasal sinus disease. Old healed fracture right nasal bone. Electronically Signed   By: Bretta Bang III M.D.   On: 06/03/2017 13:16    EKG: Independently reviewed. Atrial fibrillation with slow ventricular response with a competing junctional pacemaker Left axis deviation Non-specific intra-ventricular conduction delay   Assessment/Plan Principal Problem:   Stroke-like symptoms Active Problems:   Atrial flutter (HCC)   Essential hypertension   GERD    Aortic stenosis   Bronchiectasis without complication (HCC)   1. Strokelike symptoms. As a history of atrial flutter status post ablation not on anticoagulation Patient with left arm weakness. No other focal deficits. Symptom resolved at the time of admission. CT of the head reveals atrophy/small vessel disease, prior infarct superior right frontal lobe, no acute infarct, no mass or hemorrhage evident.No signs of infectious process. No metabolic derangements. Evaluated by neurology who reportedly opines symptom too mild for thrombolytics and recommends admission for stroke work up -admit to telemetry -MRI/MRA of the brain -Carotid Dopplers -2-D echo -Hemoglobin A1c, lipid panel -Speech therapy/occupational therapy/physical therapy -aspirin and statin -await further recommendations from neuro  #2. A flutter. Status post ablation 2014. Patient states he was on anticoagulation prior to ablation but afterwards was taken off. EKG as noted above.Chest pain/palpitations. Home medications include Cardizem and diovan.  -Continue home meds -Monitor -Follow 2-D echo  #3. Hypertension. Fair control in the emergency department -Continue home meds -Monitor  #4.GERD. Stable at baseline -continue home meds  #5. Bronchiectasis without complication. Former smoker. Not on home oxygen. Oxygen saturation level greater than 90 on room air -follow chest xray   DVT prophylaxis: lovenox  Code Status: full  Family Communication: wife and son at bedside   Disposition Plan: home hopefully tomorrow  Consults called: [place]  Admission status: obs    Toya Smothers M MD [facility]  If 7PM-7AM, please contact night-coverage [email or web address] Password [facility]  06/03/2017, 3:03 PM

## 2017-06-03 NOTE — Consult Note (Addendum)
Referring Physician: ER Consult for: Code Stoke   Chief Complaint: "my arm isn't right"  HPI: ETAI COPADO is an 84 y.o. male with PMH listed below, who awoke today in his usual state and went to watch his friends play golf. He says this was around 10:00-10:30 this am. He doesn't know when exactly, but he noticed his arm 'not working right' when he tried to pick up his water during his visit at the golf course. He drove home and told his family and they brought him to the ER via POV. There was some confusion as to the last seen normal. Triage thought it was 11pm the night before. CTH was done at 29, shows old small Rt frontal stroke, but no acute change. Upon initial exam by the ER doctor, a Code Stroke was then called at 1345. The 4.5h window was quickly closing for IV tPA once pt was emergently evaluated. With a low and improving NIHSS it was decided to not give IV tPA at this time. Baseline mRS 0. NIHSS 3.  Date last known well: 06/03/17 Time last known well: 1000-30 am  tPA Given: not given d/t improving and low NIHSS and uncertain TLK well  Past Medical History Past Medical History:  Diagnosis Date  . Abnormal EKG    ST changes with normal coronaries by cath 05/06/12.  . Arthritis    "hands" (01/12/2013); feet (podiatry consultation)  . Atrial flutter ([place])    a. Slow ~100bpm when in 2:1; dx 04/2012. b. Placed on Xarelto.  c. s/p ablation 07-08-2012 by Dr Rayann Heman  . Atrial tachycardia ([place])   . Carotid artery calcification    By CXR - dopplers 05/06/12 without obvious evidence of significant ICA stenosis >40%  . Colon polyps 06/26/2005  . COPD (chronic obstructive pulmonary disease) ([place])   . Diverticulosis of colon (without mention of hemorrhage)   . Dysrhythmia   . Gastritis, chronic    Pt denies bleeding 04/2012. EGD 2012 reportedly normal.  . GERD (gastroesophageal reflux disease)   . Hypertension   . Hyponatremia    04/2012 r/t diuretic.  . IBS (irritable bowel syndrome)   .  Internal and external hemorrhoids without complication   . Macular degeneration of left eye   . 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 Pneumonia    "couple times" (01/12/2013)  . Shortness of breath dyspnea    walking distances    Surgical History Past Surgical History:  Procedure Laterality Date  . ATRIAL FLUTTER ABLATION  07-08-2012   of typical atrial flutter by Dr Rayann Heman  . ATRIAL FLUTTER ABLATION N/A 07/08/2012   Procedure: ATRIAL FLUTTER ABLATION;  Surgeon: Thompson Grayer, MD;  Location: [facility] CATH LAB;  Service: Cardiovascular;  Laterality: N/A;  . CARDIAC CATHETERIZATION  04/2012  . COLONOSCOPY  08/29/10   diverticulosis, internal hemorrhoids  . ESOPHAGOGASTRODUODENOSCOPY  09/20/10   normal  . EYE SURGERY  01/08/2014   Cataract resection L.    [name] Caroleen  . LEFT HEART CATHETERIZATION WITH CORONARY ANGIOGRAM N/A 05/06/2012   Procedure: LEFT HEART CATHETERIZATION WITH CORONARY ANGIOGRAM;  Surgeon: Peter M Martinique, MD;  Location: [facility] CATH LAB;  Service: Cardiovascular;  Laterality: N/A;  . 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 TRIGGER FINGER RELEASE Right 1970's   "2 fingers"  . WART FULGURATION Left 09/15/2015   Procedure: EXCISIONal biospy of left peri anual and anual canal mass;  Surgeon: Greer Pickerel, MD;  Location: [facility] ORS;  Service: General;  Laterality: Left;    Family History  Family History  Problem Relation Age of Onset  . Heart disease Brother   . Throat cancer Brother   . Other Mother        UNSURE  . Other Father        UNSURE  . Emphysema Sister   . Emphysema Sister   . Other Brother        UNSURE  . Diabetes Unknown     Social History:   reports that he quit smoking about 21 years ago. His smoking use included cigarettes, pipe, and cigars. He has a 10.00 pack-year smoking history. He has never used smokeless tobacco. He reports that he does not drink alcohol or use drugs.  Allergies:  Allergies  Allergen Reactions  . Dyazide  [Hydrochlorothiazide W-Triamterene] Other (See Comments)    Caused hyponatremia 04/2012  . Lisinopril     cough    Home Medications:   (Not in a hospital admission)  Hospital Medications .  stroke: mapping our early stages of recovery book   Does not apply Once  . aspirin  81 mg Oral Once  . cholecalciferol  1,000 Units Oral Daily  . clidinium-chlordiazePOXIDE  1 capsule Oral BID  . diltiazem  120 mg Oral QHS  . enoxaparin (LOVENOX) injection  40 mg Subcutaneous Q24H  . famotidine  20 mg Oral QHS  . fluticasone  2 spray Each Nare Daily  . gabapentin  300 mg Oral QHS  . irbesartan  75 mg Oral Daily  . pantoprazole  40 mg Oral Daily    ROS: History obtained from pt  General ROS: negative for - chills, fatigue, fever, night sweats, weight gain or weight loss Psychological ROS: negative for - behavioral disorder, hallucinations, memory difficulties, mood swings or suicidal ideation Ophthalmic ROS: negative for - blurry vision, double vision, eye pain or loss of vision ENT ROS: negative for - epistaxis, nasal discharge, oral lesions, sore throat, tinnitus or vertigo Allergy and Immunology ROS: negative for - hives or itchy/watery eyes Hematological and Lymphatic ROS: negative for - bleeding problems, bruising or swollen lymph nodes Endocrine ROS: negative for - galactorrhea, hair pattern changes, polydipsia/polyuria or temperature intolerance Respiratory ROS: negative for - cough, hemoptysis, shortness of breath or wheezing Cardiovascular ROS: negative for - chest pain, dyspnea on exertion, edema or irregular heartbeat Gastrointestinal ROS: negative for - abdominal pain, diarrhea, hematemesis, nausea/vomiting or stool incontinence Genito-Urinary ROS: negative for - dysuria, hematuria, incontinence or urinary frequency/urgency Musculoskeletal ROS: negative for - joint swelling or muscular weakness Neurological ROS: as noted in HPI Dermatological ROS: negative for rash and skin lesion  changes   Physical Examination: Vitals:   06/03/17 1233 06/03/17 1421 06/03/17 1453  BP: 132/61 (!) 141/64 (!) 163/71  Pulse: (!) 59 (!) 59 64  Resp: 18 13 14   Temp: 98.2 F (36.8 C)    TempSrc: Oral    SpO2: 96% 99% 100%    General - well nourished, appears younger than state age. No acute distress. Heart - Regular rate and rhythm - no murmer appreciated Lungs - Clear to auscultation  Abdomen - Soft - non tender Extremities - Distal pulses intact - no edema Skin - Warm and dry   Neurologic Examination:  Mental Status: Alert, oriented, thought content appropriate.  Speech fluent without evidence of aphasia. Able to follow 3 step commands without difficulty. There is mild-moderate dysphagia Cranial Nerves: II: Discs not visualized; Visual fields grossly normal, pupils equal, round, reactive to  light III,IV, VI: ptosis not present, extra-ocular motions intact bilaterally V,VII: smile symmetric, facial light touch sensation normal bilaterally VIII: hearing normal bilaterally IX,X: gag reflex present XI: bilateral shoulder shrug XII: midline tongue extension Motor: RUE - 5/5    LUE - 5/5  But with pronator drift RLE - 5/5    LLE - 5/5 Tone and bulk:normal tone throughout; no atrophy noted Sensory: Light touch intact throughout, bilaterally Deep Tendon Reflexes: 2+ and symmetric throughout Plantars: Right: downgoing   Left: downgoing Cerebellar: He overshoots with his left finger to nose. Lt HTS is wnl. Rt side wnl. Gait: deferred at this time.  NIHSS 1a Level of Conscious:0 1b LOC Questions: 0 1c LOC Commands: 0 2 Best Gaze: 0 3 Visual: 0 4 Facial Palsy: 0 5a Motor Arm - left: 1 5b Motor Arm - Right: 0 6a Motor Leg - Left: 0 6b Motor Leg - Right:0  7 Limb Ataxia: 1 8 Sensory: 0 9 Best Language: 0 10 Dysarthria:1 11 Extinct. and Inattention:0 TOTAL: 3   LABORATORY STUDIES:  Basic Metabolic Panel: Recent Labs  Lab 06/03/17 1249 06/03/17 1256  NA 134*  137  K 4.6 4.6  CL 104 102  CO2 22  --   GLUCOSE 93 88  BUN 17 18  CREATININE 2.30* 2.10*  CALCIUM 8.5*  --     Liver Function Tests: Recent Labs  Lab 06/03/17 1249  AST 21  ALT 12*  ALKPHOS 59  BILITOT 0.4  PROT 6.7  ALBUMIN 3.1*   No results for input(s): LIPASE, AMYLASE in the last 168 hours. No results for input(s): AMMONIA in the last 168 hours.  CBC: Recent Labs  Lab 06/03/17 1249 06/03/17 1256  WBC 9.7  --   NEUTROABS 7.1  --   HGB 11.0* 11.6*  HCT 34.2* 34.0*  MCV 85.9  --   PLT 221  --     Cardiac Enzymes: No results for input(s): CKTOTAL, CKMB, CKMBINDEX, TROPONINI in the last 168 hours.  BNP: Invalid input(s): POCBNP  CBG: No results for input(s): GLUCAP in the last 168 hours.  Microbiology:   Coagulation Studies: Recent Labs    06/03/17 1249  LABPROT 13.7  INR 1.06    Urinalysis: No results for input(s): COLORURINE, LABSPEC, PHURINE, GLUCOSEU, HGBUR, BILIRUBINUR, KETONESUR, PROTEINUR, UROBILINOGEN, NITRITE, LEUKOCYTESUR in the last 168 hours.  Invalid input(s): APPERANCEUR  Lipid Panel:     Component Value Date/Time   CHOL 213 (H) 04/24/2016 0728   TRIG 130.0 04/24/2016 0728   HDL 39.00 (L) 04/24/2016 0728   CHOLHDL 5 04/24/2016 0728   VLDL 26.0 04/24/2016 0728   LDLCALC 148 (H) 04/24/2016 0728    HgbA1C:  Lab Results  Component Value Date   HGBA1C 5.6 01/12/2013    Urine Drug Screen:  No results found for: LABOPIA, COCAINSCRNUR, LABBENZ, AMPHETMU, THCU, LABBARB   Alcohol Level:  No results for input(s): ETH in the last 168 hours.  Miscellaneous labs:  EKG  EKG   IMAGING Reviewed: Ct Head Wo Contrast  Result Date: 06/03/2017 CLINICAL DATA:  Left upper extremity weakness EXAM: CT HEAD WITHOUT CONTRAST TECHNIQUE: Contiguous axial images were obtained from the base of the skull through the vertex without intravenous contrast. COMPARISON:  None. FINDINGS: Brain: There is mild diffuse atrophy. There is no intracranial  mass, hemorrhage, extra-axial fluid collection, or midline shift. There is evidence of a prior small infarct in medial superior right frontal lobe. There is patchy small vessel disease in the centra semiovale bilaterally.  Elsewhere gray-white compartments appear normal. Vascular: No hyperdense vessels are evident. There is calcification each carotid siphon region and distal vertebral artery. Skull: The bony calvarium appears intact. Sinuses/Orbits: There is mucosal thickening in multiple ethmoid air cells with opacification of anterior ethmoid air cells on the left. Other visualized paranasal sinuses are clear. There is an old healed fracture of the right nasal bone. Orbits appear symmetric bilaterally except for evidence of previous cataract resection on the left. Other: Visualized mastoid air cells are clear. IMPRESSION: Atrophy with periventricular small vessel disease. Prior infarct medial superior right frontal lobe. No acute infarct demonstrable. No mass or hemorrhage evident. Foci arterial vascular calcification. Areas of paranasal sinus disease. Old healed fracture right nasal bone. Electronically Signed   By: Lowella Grip III M.D.   On: 06/03/2017 13:16      Assessment: 84 y.o. male presents with clumsy left arm and dysarthria.  Stroke Risk Factors - age, previous stoke, HTN, carotid artery calcifications, Afib s/p ablation in 2014.  # Acute stroke- suspect lacunar type based on clumsy arm/dysarthia presentation. CTH neg. MRI ordered for better etiology. Stoke wk ordered. ASA started. # HTN- allow for permissive HTN to SBP 220 for now. # No clinical h/o stroke, but seen old left frontal stroke on [facility] # h/o carotid artery calcification- cannot CTA d/t CKD, will ck CUS # CKD III- IV- Crt 2, will need to trend and add gentle hydration # h/o Afib s/p ablation- no further Xarelto since 2014  Plan:  HgbA1c, fasting lipid panel  MRI, MRA  of the brain without contrast  PT consult, OT  consult, Speech consult  Echocardiogram  Carotid dopplers  Prophylactic therapy  Statin Therapy  Risk factor modification  Telemetry monitoring  Frequent neuro checks  Fall Precautions Seen with and d/w Dr Leonel Ramsay, neurology attending  Attending Neurologist's note to follow   I have seen the patient reviewed the above note.  84 year old male with acute onset left arm ataxia and dysarthria.  I suspect a small subcortical stroke either internal capsule or pons(clumsy hand dysarthria syndrome).    I discussed treatment with IV TPA with the patient, but given his mild symptoms I recommended against treatment and he expressed understanding.  He will need to be admitted for stroke risk factor modification and therapy.   Roland Rack, MD [facility] [phone]  If 7pm- 7am, please page neurology on call as listed in [place].

## 2017-06-03 NOTE — ED Provider Notes (Signed)
[facility] EMERGENCY DEPARTMENT Provider Note   CSN: [id number] Arrival date & time: 06/03/17  1230     History   Chief Complaint Chief Complaint  Patient presents with  . Stroke Symptoms    HPI Chad [name] is a 84 y.o. male.  HPI  Patient is an 84 year old male, he has a known history of atrial flutter, he has a history of COPD as well as a history of hypertension, he presents to the hospital approximately 2-1/2 hours after developing acute onset of difficulty using his left arm.  He states that at 11:00 he tried to pick up [place], his hand would not work right and he was dropping the cooler.  He noticed that his arm is felt abnormal since that time.  He feels like he does not have coordination it.  Symptoms are persistent, severe, nothing seems to make this better or worse, it is not associated with a headache changes in vision, changes in speech, facial droop, lower extremity symptoms.  Past Medical History:  Diagnosis Date  . Abnormal EKG    ST changes with normal coronaries by cath 05/06/12.  . Arthritis    "hands" (01/12/2013); feet (podiatry consultation)  . Atrial flutter ([place])    a. Slow ~100bpm when in 2:1; dx 04/2012. b. Placed on Xarelto.  c. s/p ablation 07-08-2012 by Dr Rayann Heman  . Atrial tachycardia ([place])   . Carotid artery calcification    By CXR - dopplers 05/06/12 without obvious evidence of significant ICA stenosis >40%  . Colon polyps 06/26/2005  . COPD (chronic obstructive pulmonary disease) ([place])   . Diverticulosis of colon (without mention of hemorrhage)   . Dysrhythmia   . Gastritis, chronic    Pt denies bleeding 04/2012. EGD 2012 reportedly normal.  . GERD (gastroesophageal reflux disease)   . Hypertension   . Hyponatremia    04/2012 r/t diuretic.  . IBS (irritable bowel syndrome)   . Internal and external hemorrhoids without complication   . Macular degeneration of left eye   . 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 Pneumonia    "couple times" (01/12/2013)  . Shortness of breath dyspnea    walking distances    Patient Active Problem List   Diagnosis Date Noted  . Stroke-like symptoms 06/03/2017  . Bronchiectasis without complication ([place]) 51/76/1607  . DOE (dyspnea on exertion) 04/24/2017  . Cough 02/13/2017  . Flatulence 02/13/2017  . Hemoptysis 06/12/2016  . Creatinine elevation 06/12/2016  . Eyebrow laceration, right, sequela 05/18/2016  . Abnormal TSH 05/18/2016  . Well adult exam 04/20/2016  . Enlarged prostate 04/20/2016  . Community acquired pneumonia   . Pneumonia of both lungs due to infectious organism 03/02/2016  . 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 Anal lesion 06/07/2015  . Gas 01/18/2015  . Aortic stenosis 06/08/2013  . Unsteadiness 02/11/2013  . Ectopic atrial tachycardia ([place]) 01/12/2013  . Atrial tachycardia ([place]) 01/12/2013  . Atrial flutter ([place]) 05/06/2012  . Aortic valve sclerosis 05/06/2012  . Acute renal failure ([place]) 05/05/2012  . 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 Anemia 07/27/2010  . Essential hypertension 11/15/2008  . COPD 11/15/2008  . GERD 11/15/2008  . IBS 11/15/2008  . Midsternal chest pain 11/15/2008    Past Surgical History:  Procedure Laterality Date  . ATRIAL FLUTTER ABLATION  07-08-2012   of typical atrial flutter by Dr Rayann Heman  .  ATRIAL FLUTTER ABLATION N/A 07/08/2012   Procedure: ATRIAL FLUTTER ABLATION;  Surgeon: Thompson Grayer, MD;  Location: [facility] CATH LAB;  Service: Cardiovascular;  Laterality: N/A;  . CARDIAC CATHETERIZATION  04/2012  . COLONOSCOPY  08/29/10   diverticulosis, internal hemorrhoids  . ESOPHAGOGASTRODUODENOSCOPY  09/20/10   normal  . EYE SURGERY  01/08/2014   Cataract resection L.    [name] [place]  . LEFT HEART CATHETERIZATION WITH CORONARY ANGIOGRAM N/A 05/06/2012   Procedure: LEFT HEART CATHETERIZATION WITH CORONARY ANGIOGRAM;   Surgeon: Peter M Martinique, MD;  Location: [facility] CATH LAB;  Service: Cardiovascular;  Laterality: N/A;  . 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 TRIGGER FINGER RELEASE Right 1970's   "2 fingers"  . WART FULGURATION Left 09/15/2015   Procedure: EXCISIONal biospy of left peri anual and anual canal mass;  Surgeon: Greer Pickerel, MD;  Location: [facility] ORS;  Service: General;  Laterality: Left;        Home Medications    Prior to Admission medications   Medication Sig Start Date End Date Taking? Authorizing Provider  cholecalciferol (VITAMIN D) 1000 units tablet Take 1 tablet (1,000 Units total) by mouth daily. 10/15/16   Plotnikov, Evie Lacks, MD  clidinium-chlordiazePOXIDE (LIBRAX) 5-2.5 MG capsule TAKE 1 CAPSULE BY MOUTH TWICE DAILY 04/01/17   Plotnikov, Evie Lacks, MD  cyanocobalamin 1000 MCG tablet Take 1,000 mcg by mouth daily as needed (supplement).     [provider]  diltiazem (CARDIZEM CD) 120 MG 24 hr capsule Take 1 capsule (120 mg total) by mouth at bedtime. 03/11/17   Plotnikov, Evie Lacks, MD  famotidine (PEPCID) 20 MG tablet One at bedtime 05/27/17   Tanda Rockers, MD  fluticasone [facility]) 50 MCG/ACT nasal spray Place 2 sprays into both nostrils daily. 07/22/16   Plotnikov, Evie Lacks, MD  gabapentin (NEURONTIN) 300 MG capsule Take 3 capsules at bedtime. 07/22/16   Plotnikov, Evie Lacks, MD  multivitamin-lutein (OCUVITE-LUTEIN) CAPS capsule Take 1 capsule by mouth daily.     [provider]  pantoprazole (PROTONIX) 40 MG tablet Take 1 tablet (40 mg total) by mouth daily. Take 30-60 min before first meal of the day 05/27/17   Tanda Rockers, MD  polyethylene glycol powder (GLYCOLAX/MIRALAX) powder Take 17 g by mouth 2 (two) times daily as needed. 03/14/16   Wardell Honour, MD  Probiotic Product (ALIGN) 4 MG CAPS Take 1 capsule (4 mg total) by mouth daily. Start to take after you finish Metronidazole 02/13/17   Plotnikov, Evie Lacks, MD  valsartan (DIOVAN) 80 MG tablet Take  1 tablet (80 mg total) by mouth daily. 03/12/17   Lelon Perla, MD    Family History Family History  Problem Relation Age of Onset  . Heart disease Brother   . Throat cancer Brother   . Other Mother        UNSURE  . Other Father        UNSURE  . Emphysema Sister   . Emphysema Sister   . Other Brother        UNSURE  . Diabetes Unknown     Social History Social History   Tobacco Use  . Smoking status: Former Smoker    Packs/day: 0.50    Years: 20.00    Pack years: 10.00    Types: Cigarettes, Pipe, Cigars    Last attempt to quit: 01/09/1996    Years since quitting: 21.4  . Smokeless tobacco: Never Used  .  Tobacco comment:    Substance Use Topics  . Alcohol use: No    Alcohol/week: 0.0 oz    Frequency: Never    Comment: 01/12/2013 "did drink 2-3 12 oz beers a day; stopped drinking in 04/2012"  . Drug use: No     Allergies   Dyazide [hydrochlorothiazide w-triamterene] and Lisinopril   Review of Systems Review of Systems  All other systems reviewed and are negative.    Physical Exam Updated Vital Signs BP (!) 141/64 (BP Location: Left Arm)   Pulse (!) 59   Temp 98.2 F (36.8 C) (Oral)   Resp 13   SpO2 99%   Physical Exam  Constitutional: He appears well-developed and well-nourished. No distress.  HENT:  Head: Normocephalic and atraumatic.  Mouth/Throat: Oropharynx is clear and moist. No oropharyngeal exudate.  Eyes: Pupils are equal, round, and reactive to light. Conjunctivae and EOM are normal. Right eye exhibits no discharge. Left eye exhibits no discharge. No scleral icterus.  Neck: Normal range of motion. Neck supple. No JVD present. No thyromegaly present.  Cardiovascular: Normal rate, regular rhythm, normal heart sounds and intact distal pulses. Exam reveals no gallop and no friction rub.  No murmur heard. Pulmonary/Chest: Effort normal and breath sounds normal. No respiratory distress. He has no wheezes. He has no rales.  Abdominal: Soft. Bowel  sounds are normal. He exhibits no distension and no mass. There is no tenderness.  Musculoskeletal: Normal range of motion. He exhibits no edema or tenderness.  Lymphadenopathy:    He has no cervical adenopathy.  Neurological: He is alert.  Speech is clear, cranial nerves III through XII are intact, memory is intact, strength is normal in all 4 extremities except for the left hand which has a slightly weak grip, left upper extremity which is slightly weak and dysmetria in the left hand, sensation is intact to light touch and pinprick in all 4 extremities.  There is limb ataxia on the left, he has some pronator drift on the left, he has no weakness or disc coordination of the lower extremity's.    Skin: Skin is warm and dry. No rash noted. No erythema.  Psychiatric: He has a normal mood and affect. His behavior is normal.  Nursing note and vitals reviewed.    ED Treatments / Results  Labs (all labs ordered are listed, but only abnormal results are displayed) Labs Reviewed  CBC - Abnormal; Notable for the following components:      Result Value   RBC 3.98 (*)    Hemoglobin 11.0 (*)    HCT 34.2 (*)    All other components within normal limits  COMPREHENSIVE METABOLIC PANEL - Abnormal; Notable for the following components:   Sodium 134 (*)    Creatinine, Ser 2.30 (*)    Calcium 8.5 (*)    Albumin 3.1 (*)    ALT 12 (*)    GFR calc non Af Amer 24 (*)    GFR calc Af Amer 28 (*)    All other components within normal limits  I-STAT CHEM 8, ED - Abnormal; Notable for the following components:   Creatinine, Ser 2.10 (*)    Calcium, Ion 1.11 (*)    TCO2 20 (*)    Hemoglobin 11.6 (*)    HCT 34.0 (*)    All other components within normal limits  PROTIME-INR  APTT  DIFFERENTIAL  I-STAT TROPONIN, ED  CBG MONITORING, ED    EKG EKG Interpretation  Date/Time:  Monday Jun 03 2017  12:35:52 EDT Ventricular Rate:  47 PR Interval:    QRS Duration: 122 QT Interval:  456 QTC  Calculation: 403 R Axis:   -53 Text Interpretation:  Atrial fibrillation with slow ventricular response with a competing junctional pacemaker Left axis deviation Non-specific intra-ventricular conduction delay Abnormal ECG Since last tracing rate slower Confirmed by Noemi Chapel [phone]) on 06/03/2017 1:51:20 PM   Radiology Ct Head Wo Contrast  Result Date: 06/03/2017 CLINICAL DATA:  Left upper extremity weakness EXAM: CT HEAD WITHOUT CONTRAST TECHNIQUE: Contiguous axial images were obtained from the base of the skull through the vertex without intravenous contrast. COMPARISON:  None. FINDINGS: Brain: There is mild diffuse atrophy. There is no intracranial mass, hemorrhage, extra-axial fluid collection, or midline shift. There is evidence of a prior small infarct in medial superior right frontal lobe. There is patchy small vessel disease in the centra semiovale bilaterally. Elsewhere gray-white compartments appear normal. Vascular: No hyperdense vessels are evident. There is calcification each carotid siphon region and distal vertebral artery. Skull: The bony calvarium appears intact. Sinuses/Orbits: There is mucosal thickening in multiple ethmoid air cells with opacification of anterior ethmoid air cells on the left. Other visualized paranasal sinuses are clear. There is an old healed fracture of the right nasal bone. Orbits appear symmetric bilaterally except for evidence of previous cataract resection on the left. Other: Visualized mastoid air cells are clear. IMPRESSION: Atrophy with periventricular small vessel disease. Prior infarct medial superior right frontal lobe. No acute infarct demonstrable. No mass or hemorrhage evident. Foci arterial vascular calcification. Areas of paranasal sinus disease. Old healed fracture right nasal bone. Electronically Signed   By: Lowella Grip III M.D.   On: 06/03/2017 13:16    Procedures Procedures (including critical care time)  Medications Ordered in  ED Medications - No data to display   Initial Impression / Assessment and Plan / ED Course  I have reviewed the triage vital signs and the nursing notes.  Pertinent labs & imaging results that were available during my care of the patient were reviewed by me and considered in my medical decision making (see chart for details).  Clinical Course as of Jun 03 1433  Mon Jun 03, 2017  1415 Allergy has seen the patient and recommends inpatient admission for stroke but thrombolytics not indicated according to neurology due to the mildness of the symptoms.   [BM]    Clinical Course User Index [BM] Noemi Chapel, MD   CT scan reviewed, no signs of acute hemorrhage, code stroke was activated immediately upon my evaluation of the patient.  Initially it was thought that the patient had symptoms at onset 11:00 last night however it is actually 11:00 AM.  He is within the 4-1/2-hour window, neurology code stroke was activated at 1:45 PM.  The patient is not on anticoagulants, his creatinine is 2, his troponin is normal, his INR is normal.  I discussed the care with the hospitalist service, Santiago Glad, nurse practitioner will admit the patient for acute ischemic stroke.  Final Clinical Impressions(s) / ED Diagnoses   Final diagnoses:  Acute ischemic stroke ([place])  Acute kidney injury ([place])      Noemi Chapel, MD 06/03/17 1435

## 2017-06-04 ENCOUNTER — Inpatient Hospital Stay (HOSPITAL_COMMUNITY): Payer: Medicare Other

## 2017-06-04 ENCOUNTER — Encounter (HOSPITAL_COMMUNITY): Payer: Self-pay

## 2017-06-04 DIAGNOSIS — E785 Hyperlipidemia, unspecified: Secondary | ICD-10-CM | POA: Diagnosis present

## 2017-06-04 DIAGNOSIS — N179 Acute kidney failure, unspecified: Secondary | ICD-10-CM | POA: Diagnosis not present

## 2017-06-04 DIAGNOSIS — N184 Chronic kidney disease, stage 4 (severe): Secondary | ICD-10-CM | POA: Diagnosis present

## 2017-06-04 DIAGNOSIS — I361 Nonrheumatic tricuspid (valve) insufficiency: Secondary | ICD-10-CM

## 2017-06-04 DIAGNOSIS — R471 Dysarthria and anarthria: Secondary | ICD-10-CM | POA: Diagnosis present

## 2017-06-04 DIAGNOSIS — I350 Nonrheumatic aortic (valve) stenosis: Secondary | ICD-10-CM | POA: Diagnosis present

## 2017-06-04 DIAGNOSIS — Z87891 Personal history of nicotine dependence: Secondary | ICD-10-CM | POA: Diagnosis not present

## 2017-06-04 DIAGNOSIS — R2990 Unspecified symptoms and signs involving the nervous system: Secondary | ICD-10-CM

## 2017-06-04 DIAGNOSIS — I4892 Unspecified atrial flutter: Secondary | ICD-10-CM | POA: Diagnosis present

## 2017-06-04 DIAGNOSIS — I63511 Cerebral infarction due to unspecified occlusion or stenosis of right middle cerebral artery: Secondary | ICD-10-CM | POA: Diagnosis present

## 2017-06-04 DIAGNOSIS — I130 Hypertensive heart and chronic kidney disease with heart failure and stage 1 through stage 4 chronic kidney disease, or unspecified chronic kidney disease: Secondary | ICD-10-CM | POA: Diagnosis present

## 2017-06-04 DIAGNOSIS — E1122 Type 2 diabetes mellitus with diabetic chronic kidney disease: Secondary | ICD-10-CM | POA: Diagnosis present

## 2017-06-04 DIAGNOSIS — R402142 Coma scale, eyes open, spontaneous, at arrival to emergency department: Secondary | ICD-10-CM | POA: Diagnosis present

## 2017-06-04 DIAGNOSIS — Z86010 Personal history of colonic polyps: Secondary | ICD-10-CM | POA: Diagnosis not present

## 2017-06-04 DIAGNOSIS — J449 Chronic obstructive pulmonary disease, unspecified: Secondary | ICD-10-CM | POA: Diagnosis present

## 2017-06-04 DIAGNOSIS — I6340 Cerebral infarction due to embolism of unspecified cerebral artery: Secondary | ICD-10-CM

## 2017-06-04 DIAGNOSIS — I5032 Chronic diastolic (congestive) heart failure: Secondary | ICD-10-CM | POA: Diagnosis present

## 2017-06-04 DIAGNOSIS — R402252 Coma scale, best verbal response, oriented, at arrival to emergency department: Secondary | ICD-10-CM | POA: Diagnosis present

## 2017-06-04 DIAGNOSIS — Z85819 Personal history of malignant neoplasm of unspecified site of lip, oral cavity, and pharynx: Secondary | ICD-10-CM | POA: Diagnosis not present

## 2017-06-04 DIAGNOSIS — K219 Gastro-esophageal reflux disease without esophagitis: Secondary | ICD-10-CM | POA: Diagnosis present

## 2017-06-04 DIAGNOSIS — Z888 Allergy status to other drugs, medicaments and biological substances status: Secondary | ICD-10-CM | POA: Diagnosis not present

## 2017-06-04 DIAGNOSIS — R29703 NIHSS score 3: Secondary | ICD-10-CM | POA: Diagnosis present

## 2017-06-04 DIAGNOSIS — I639 Cerebral infarction, unspecified: Secondary | ICD-10-CM | POA: Diagnosis not present

## 2017-06-04 DIAGNOSIS — I483 Typical atrial flutter: Secondary | ICD-10-CM

## 2017-06-04 DIAGNOSIS — K589 Irritable bowel syndrome without diarrhea: Secondary | ICD-10-CM | POA: Diagnosis present

## 2017-06-04 DIAGNOSIS — I10 Essential (primary) hypertension: Secondary | ICD-10-CM | POA: Diagnosis not present

## 2017-06-04 DIAGNOSIS — G459 Transient cerebral ischemic attack, unspecified: Secondary | ICD-10-CM | POA: Diagnosis not present

## 2017-06-04 DIAGNOSIS — N189 Chronic kidney disease, unspecified: Secondary | ICD-10-CM | POA: Diagnosis present

## 2017-06-04 DIAGNOSIS — J479 Bronchiectasis, uncomplicated: Secondary | ICD-10-CM | POA: Diagnosis not present

## 2017-06-04 DIAGNOSIS — R402362 Coma scale, best motor response, obeys commands, at arrival to emergency department: Secondary | ICD-10-CM | POA: Diagnosis present

## 2017-06-04 LAB — ECHOCARDIOGRAM COMPLETE

## 2017-06-04 LAB — LIPID PANEL
CHOL/HDL RATIO: 5.7 ratio
CHOLESTEROL: 176 mg/dL (ref 0–200)
HDL: 31 mg/dL — ABNORMAL LOW (ref >40)
LDL Cholesterol: 122 mg/dL — ABNORMAL HIGH (ref 0–99)
Triglycerides: 113 mg/dL (ref <150)
VLDL: 23 mg/dL (ref 0–40)

## 2017-06-04 LAB — HEMOGLOBIN A1C
Hgb A1c MFr Bld: 5.6 % (ref 4.8–5.6)
Mean Plasma Glucose: 114.02 mg/dL

## 2017-06-04 MED ORDER — APIXABAN 2.5 MG PO TABS
2.5000 mg | ORAL_TABLET | Freq: Two times a day (BID) | ORAL | Status: DC
  Administered 2017-06-04 – 2017-06-05 (×2): 2.5 mg via ORAL
  Filled 2017-06-04 (×2): qty 1

## 2017-06-04 MED ORDER — ASPIRIN EC 81 MG PO TBEC
81.0000 mg | DELAYED_RELEASE_TABLET | Freq: Every day | ORAL | Status: DC
  Administered 2017-06-04: 81 mg via ORAL
  Filled 2017-06-04: qty 1

## 2017-06-04 MED ORDER — ENSURE ENLIVE PO LIQD
237.0000 mL | Freq: Two times a day (BID) | ORAL | Status: DC
  Administered 2017-06-04: 237 mL via ORAL

## 2017-06-04 NOTE — Progress Notes (Addendum)
STROKE TEAM PROGRESS NOTE  HPI: ( Dr Leonel Ramsay )Chad [name] is an 84 y.o. male with PMH listed below, who awoke today in his usual state and went to watch his friends play golf. He says this was around 10:00-10:30 this am. He doesn't know when exactly, but he noticed his arm 'not working right' when he tried to pick up his water during his visit at the golf course. He drove home and told his family and they brought him to the ER via POV. There was some confusion as to the last seen normal. Triage thought it was 11pm the night before. CTH was done at 39, shows old small Rt frontal stroke, but no acute change. Upon initial exam by the ER doctor, a Code Stroke was then called at 1345. The 4.5h window was quickly closing for IV tPA once pt was emergently evaluated. With a low and improving NIHSS it was decided to not give IV tPA at this time. Baseline mRS 0. NIHSS 3.  Date last known well: 06/03/17 Time last known well: 1000-30 am  tPA Given: not given d/t improving and low NIHSS and uncertain TLK well   INTERVAL HISTORY Patient up in the chair. No complaints. States he feels back to normal. He had trouble with his L hand yesterday - weak when he went to pick up his big drink with both hands. He is R handed. I have reviewed the history of presenting illness in detail with the patient. He was previously on Xarelto which was discontinued after ablation for his atrial flutter a few years ago.  Vitals:   06/03/17 2200 06/04/17 0000 06/04/17 0327 06/04/17 0755  BP: (!) 150/86 137/76 (!) 148/71 (!) 163/74  Pulse: 88 (!) 58    Resp: 17 12    Temp:   98 F (36.7 C) 97.8 F (36.6 C)  TempSrc:   Axillary Oral  SpO2: 99% 96%      CBC:  Recent Labs  Lab 06/03/17 1249 06/03/17 1256  WBC 9.7  --   NEUTROABS 7.1  --   HGB 11.0* 11.6*  HCT 34.2* 34.0*  MCV 85.9  --   PLT 221  --     Basic Metabolic Panel:  Recent Labs  Lab 06/03/17 1249 06/03/17 1256  NA 134* 137  K 4.6 4.6  CL 104  102  CO2 22  --   GLUCOSE 93 88  BUN 17 18  CREATININE 2.30* 2.10*  CALCIUM 8.5*  --    Lipid Panel:     Component Value Date/Time   CHOL 176 06/04/2017 0626   TRIG 113 06/04/2017 0626   HDL 31 (L) 06/04/2017 0626   CHOLHDL 5.7 06/04/2017 0626   VLDL 23 06/04/2017 0626   LDLCALC 122 (H) 06/04/2017 0626   HgbA1c:  Lab Results  Component Value Date   HGBA1C 5.6 01/12/2013   Urine Drug Screen: No results found for: LABOPIA, COCAINSCRNUR, LABBENZ, AMPHETMU, THCU, LABBARB  Alcohol Level No results found for: [facility]  IMAGING Dg Chest 2 View  Result Date: 06/03/2017 CLINICAL DATA:  Transient ischemic attack. History of cardiac arrhythmia EXAM: CHEST - 2 VIEW COMPARISON:  None. FINDINGS: There is bibasilar fibrotic change with mild lower lobe bronchiectatic change. No edema or consolidation. Heart is mildly enlarged with pulmonary vascularity normal. No adenopathy. There is aortic atherosclerosis. No evident bone lesions. There is calcification in both carotid arteries. IMPRESSION: Bibasilar fibrosis and bronchiectasis. No frank edema or consolidation. Stable cardiac prominence with pulmonary vascularity normal.  There is aortic atherosclerosis as well as extensive carotid artery calcification bilaterally. Aortic Atherosclerosis (ICD10-I70.0). Electronically Signed   By: Lowella Grip III M.D.   On: 06/03/2017 15:25   Ct Head Wo Contrast  Result Date: 06/03/2017 CLINICAL DATA:  Left upper extremity weakness EXAM: CT HEAD WITHOUT CONTRAST TECHNIQUE: Contiguous axial images were obtained from the base of the skull through the vertex without intravenous contrast. COMPARISON:  None. FINDINGS: Brain: There is mild diffuse atrophy. There is no intracranial mass, hemorrhage, extra-axial fluid collection, or midline shift. There is evidence of a prior small infarct in medial superior right frontal lobe. There is patchy small vessel disease in the centra semiovale bilaterally. Elsewhere gray-white  compartments appear normal. Vascular: No hyperdense vessels are evident. There is calcification each carotid siphon region and distal vertebral artery. Skull: The bony calvarium appears intact. Sinuses/Orbits: There is mucosal thickening in multiple ethmoid air cells with opacification of anterior ethmoid air cells on the left. Other visualized paranasal sinuses are clear. There is an old healed fracture of the right nasal bone. Orbits appear symmetric bilaterally except for evidence of previous cataract resection on the left. Other: Visualized mastoid air cells are clear. IMPRESSION: Atrophy with periventricular small vessel disease. Prior infarct medial superior right frontal lobe. No acute infarct demonstrable. No mass or hemorrhage evident. Foci arterial vascular calcification. Areas of paranasal sinus disease. Old healed fracture right nasal bone. Electronically Signed   By: Lowella Grip III M.D.   On: 06/03/2017 13:16   Mr Brain Wo Contrast  Result Date: 06/03/2017 CLINICAL DATA:  Acute onset of LEFT arm weakness. EXAM: MRI HEAD WITHOUT CONTRAST MRA HEAD WITHOUT CONTRAST TECHNIQUE: Multiplanar, multiecho pulse sequences of the brain and surrounding structures were obtained without intravenous contrast. Angiographic images of the head were obtained using MRA technique without contrast. COMPARISON:  CT head earlier today. FINDINGS: MRI HEAD FINDINGS Brain: Small foci of restricted diffusion affect the RIGHT posterior frontal cortex and subcortical white matter consistent with acute infarction. No similar lesions elsewhere. No hemorrhage, mass lesion, or extra-axial fluid. Generalized atrophy. Hydrocephalus ex vacuo. Mild subcortical and periventricular T2 and FLAIR hyperintensities, likely chronic microvascular ischemic change. Old RIGHT ACA infarct affects the medial frontal cortex. Vascular: Normal flow voids. Skull and upper cervical spine: Normal marrow signal. Sinuses/Orbits: Negative. Other: None.  MRA HEAD FINDINGS The internal carotid arteries are widely patent. The basilar artery is widely patent with vertebrals codominant. RIGHT A1 ACA widely patent. The distal anterior cerebral arteries are fed exclusively from the RIGHT A1 ACA. The LEFT A1 ACA is diseased or hypoplastic. Both middle cerebral arteries are widely patent. No MCA disease of significance. Both posterior cerebral arteries are widely patent. No cerebellar branch occlusion. No saccular aneurysm. IMPRESSION: Acute RIGHT MCA territory infarct, nonhemorrhagic, affecting the precentral posterior frontal cortex and subcortical white matter. The location would correlate with symptoms of LEFT arm weakness. Atrophy and small vessel disease.  Old RIGHT ACA infarct. MRA demonstrating no ICA or MCA abnormality of significance. Severely diseased hypoplastic LEFT A1 ACA, incidental finding. Electronically Signed   By: Staci Righter M.D.   On: 06/03/2017 19:57   Mr Jodene Nam Head Wo Contrast  Result Date: 06/03/2017 CLINICAL DATA:  Acute onset of LEFT arm weakness. EXAM: MRI HEAD WITHOUT CONTRAST MRA HEAD WITHOUT CONTRAST TECHNIQUE: Multiplanar, multiecho pulse sequences of the brain and surrounding structures were obtained without intravenous contrast. Angiographic images of the head were obtained using MRA technique without contrast. COMPARISON:  CT head earlier today.  FINDINGS: MRI HEAD FINDINGS Brain: Small foci of restricted diffusion affect the RIGHT posterior frontal cortex and subcortical white matter consistent with acute infarction. No similar lesions elsewhere. No hemorrhage, mass lesion, or extra-axial fluid. Generalized atrophy. Hydrocephalus ex vacuo. Mild subcortical and periventricular T2 and FLAIR hyperintensities, likely chronic microvascular ischemic change. Old RIGHT ACA infarct affects the medial frontal cortex. Vascular: Normal flow voids. Skull and upper cervical spine: Normal marrow signal. Sinuses/Orbits: Negative. Other: None. MRA HEAD  FINDINGS The internal carotid arteries are widely patent. The basilar artery is widely patent with vertebrals codominant. RIGHT A1 ACA widely patent. The distal anterior cerebral arteries are fed exclusively from the RIGHT A1 ACA. The LEFT A1 ACA is diseased or hypoplastic. Both middle cerebral arteries are widely patent. No MCA disease of significance. Both posterior cerebral arteries are widely patent. No cerebellar branch occlusion. No saccular aneurysm. IMPRESSION: Acute RIGHT MCA territory infarct, nonhemorrhagic, affecting the precentral posterior frontal cortex and subcortical white matter. The location would correlate with symptoms of LEFT arm weakness. Atrophy and small vessel disease.  Old RIGHT ACA infarct. MRA demonstrating no ICA or MCA abnormality of significance. Severely diseased hypoplastic LEFT A1 ACA, incidental finding. Electronically Signed   By: Staci Righter M.D.   On: 06/03/2017 19:57    PHYSICAL EXAM Pleasant obese elderly Caucasian male currently not in distress. . Afebrile. Head is nontraumatic. Neck is supple without bruit.    Cardiac exam no murmur or gallop. Lungs are clear to auscultation. Distal pulses are well felt. Neurological Exam ;  Patient alert and oriented x 3. Speech clear. No aphasia. No dysarthria. Extraoccular movements intact. Visual fields full. Face symmetric. Tongue midline. Moves all extremities x 4. Strength normal. Coordination normal except for mild left finger-to-nose dysmetria.. Sensation intact. ait not tested.   ASSESSMENT/PLAN Chad [name] is a 84 y.o. male with history of AF s/p ablation in 2014 not on AC since that time, HTN, HLD, COPD, GERD, oral cancer, OS macular degeneration, CKD and former smoker presenting with L arm and handweakness.   Stroke:  right MCA cortical and subcortical infarcts embolic secondary to known atrial fibrillation  CT head Small vessel disease. Atrophy. Old R frontal infarct. Paranasal sinus dx. Old healed fx R  nasal bone.   MRI  R MCA precentral posterior frontal cortex and subcortical white matter. Small vessel disease. Atrophy. Old R ACA infarct.   MRA  Hypoplastic L A1 ACA  Carotid Doppler  pending   2D Echo  pending   LDL 122  HgbA1c 5.6  Lovenox 30 mg sq daily for VTE prophylaxis  No antithrombotic prior to admission, now on No antithrombotic. Aspirin 81 mg added. Given AF hx, recommend life long anticoagulation for secondary stroke prevention. Eliquis favored given CKD and ability to dose adjust. Discussed with pt. Will ask pharmacy to dose.  Therapy recommendations:  HH PT  Disposition:  pending (anticipate return home)  Hx Atrial Flutter s/p ablation 2014 Hx Atrial tachycardia  Home anticoagulation:  No antithrombotic. Was on xarleto post ablation, off in 2014  CHA2DS2-VASc Score = at least 6, ?2 oral anticoagulation recommended  Age in Years:  ?68   +2    Sex:  Male   Male   +1    Hypertension History:  yes   +1     Diabetes Mellitus:  0  Congestive Heart Failure History:  0  Vascular Disease History:  0     Stroke/TIA/Thromboembolism History:  yes   +2  Recommend life long anticoagulation for secondary stroke prevention. Eliquis favored given CKD and ability to dose adjust. Discussed with pt. Will ask pharmacy to dose.  Hypertension  Stable . Permissive hypertension (OK if < 220/120) but gradually normalize in 5-7 days . Long-term BP goal normotensive  Hyperlipidemia  Home meds:  No statin  LDL 122, goal < 70  Now on Lipitor 40  Continue statin at discharge  Other Stroke Risk Factors  Advanced age  Former Cigarette smoker, quit 21 yrs ago  Aortic stenosis  Other Active Problems  COPD/bronchiectasis  Hospital day # 0  Burnetta Sabin, MSN, APRN, ANVP-BC, AGPCNP-BC Advanced Practice Stroke Nurse [place] for Schedule & Pager information 06/04/2017 2:00 PM  I have personally examined this patient, reviewed notes,  independently viewed imaging studies, participated in medical decision making and plan of care.ROS completed by me personally and pertinent positives fully documented  I have made any additions or clarifications directly to the above note. Agree with note above. He has presented with embolic right frontal MCA branch infarct with known history of atrial fibrillation and hence needs to be on anticoagulation. Given his worsening renal function dose adjusted eliquis on warfarin would prefer. Long discussion with patient about his embolic stroke and treatment options and answered questions. Greater than 50% time during this 35 minute visit was spent on counseling and coordination of care about his embolic stroke, atrial fibrillation and answered questions. Discussed with Dr. Beatris Si, MD Medical Director [place] Pager: [phone] 06/04/2017 2:28 PM  To contact Stroke Continuity provider, please refer to [email or web address] After hours, contact General Neurology

## 2017-06-04 NOTE — Progress Notes (Signed)
ANTICOAGULATION CONSULT NOTE - Initial Consult  Pharmacy Consult for Eliquis Indication: atrial fibrillation  Allergies  Allergen Reactions  . Dyazide [Hydrochlorothiazide W-Triamterene] Other (See Comments)    Caused hyponatremia 04/2012  . Lisinopril Other (See Comments)    cough    Patient Measurements:    Vital Signs: Temp: 97.6 F (36.4 C) (05/28 1148) Temp Source: Oral (05/28 1148) BP: 163/74 (05/28 0755)  Labs: Recent Labs    06/03/17 1249 06/03/17 1256  HGB 11.0* 11.6*  HCT 34.2* 34.0*  PLT 221  --   APTT 31  --   LABPROT 13.7  --   INR 1.06  --   CREATININE 2.30* 2.10*    Estimated Creatinine Clearance: 29.6 mL/min (A) (by C-G formula based on SCr of 2.1 mg/dL (H)).   Assessment: 87 YOM with history of AFib s/p ablation in 2014, has not been on anticoagulation since then. Now with R MCA stroke and to start Eliquis- per stroke team, ok to stop aspirin when Eliquis starts.  Based on patient's age (86) and renal function (Scr 2.1), he meets requirements for dose reduction. No bleeding noted.  Goal of Therapy:    Monitor platelets by anticoagulation protocol: Yes   Plan:  Eliquis 2.5mg  PO BID starting this evening Aspirin has been discontinued Lovenox for VTE prophylaxis has also been discontinued as it is not needed with Eliquis Follow CBC and s/s bleeding Will educate prior to discharge  Filippa Yarbough D. Nataline Basara, PharmD, BCPS Clinical Pharmacist [phone] 06/04/2017 2:26 PM

## 2017-06-04 NOTE — Progress Notes (Signed)
PROGRESS NOTE  Chad [name] SJG:[id number] DOB: 1931/10/03 DOA: 06/03/2017 PCP: Cassandria Anger, MD   LOS: 0 days   Brief Narrative / Interim history: 84 year old male with history of a flutter status post ablation in 2014, not on anticoagulation, hypertension, COPD, aortic stenosis who was admitted to the hospital on 5/27 with left arm weakness.  MRI on admission showed acute right MCA territory infarct.  Assessment & Plan: Principal Problem:   Stroke-like symptoms Active Problems:   Essential hypertension   GERD   Atrial flutter (HCC)   Aortic stenosis   Bronchiectasis without complication (HCC)   Cerebral embolism with cerebral infarction   Acute CVA -Morning on admission with acute right MCA territory infarct, nonhemorrhagic -Complete stroke work-up with 2D echo -Lipid panel showed an LDL of 122, continue statin -Hemoglobin A1c 5.6 -appreciate neurology consultation, discussed with Dr. Leonie Man over the phone  Hypertension -Allow permissive hypertension  Hyperlipidemia -Continue statin  History of a flutter -Admission EKG appears to be in rate controlled fibrillation, he is in sinus on telemetry today.  Patient tells me that he gets palpitations once in a while -Likely needs to be started on anticoagulation  COPD/bronchiectasis -Former smoker, stable, not hypoxic   DVT prophylaxis: Lovenox Code Status: Full code Family Communication: No family present at bedside Disposition Plan: Home when work-up complete  Consultants:   Neurology  Procedures:   2D echo: pending  Antimicrobials:  None    Subjective: - no chest pain, shortness of breath, no abdominal pain, nausea or vomiting.  Feels back to normal, his weakness is resolved  Objective: Vitals:   06/04/17 0000 06/04/17 0327 06/04/17 0755 06/04/17 1148  BP: 137/76 (!) 148/71 (!) 163/74   Pulse: (!) 58     Resp: 12     Temp:  98 F (36.7 C) 97.8 F (36.6 C) 97.6 F (36.4 C)  TempSrc:   Axillary Oral Oral  SpO2: 96%       Intake/Output Summary (Last 24 hours) at 06/04/2017 1327 Last data filed at 06/04/2017 0328 Gross per 24 hour  Intake 0 ml  Output 800 ml  Net -800 ml   There were no vitals filed for this visit.  Examination:  Constitutional: NAD Eyes: PERRL, lids and conjunctivae normal ENMT: Mucous membranes are moist.  Neck: normal, supple Respiratory: clear to auscultation bilaterally, no wheezing, no crackles. Normal respiratory effort.  Cardiovascular: Regular rate and rhythm, no murmurs / rubs / gallops. No LE edema. 2+ pedal pulses.  Abdomen: no tenderness. Bowel sounds positive.  Skin: no rashes Neurologic: non focal    Data Reviewed: I have independently reviewed following labs and imaging studies   CBC: Recent Labs  Lab 06/03/17 1249 06/03/17 1256  WBC 9.7  --   NEUTROABS 7.1  --   HGB 11.0* 11.6*  HCT 34.2* 34.0*  MCV 85.9  --   PLT 221  --    Basic Metabolic Panel: Recent Labs  Lab 06/03/17 1249 06/03/17 1256  NA 134* 137  K 4.6 4.6  CL 104 102  CO2 22  --   GLUCOSE 93 88  BUN 17 18  CREATININE 2.30* 2.10*  CALCIUM 8.5*  --    GFR: Estimated Creatinine Clearance: 29.6 mL/min (A) (by C-G formula based on SCr of 2.1 mg/dL (H)). Liver Function Tests: Recent Labs  Lab 06/03/17 1249  AST 21  ALT 12*  ALKPHOS 59  BILITOT 0.4  PROT 6.7  ALBUMIN 3.1*   No results for input(s): LIPASE,  AMYLASE in the last 168 hours. No results for input(s): AMMONIA in the last 168 hours. Coagulation Profile: Recent Labs  Lab 06/03/17 1249  INR 1.06   Cardiac Enzymes: No results for input(s): CKTOTAL, CKMB, CKMBINDEX, TROPONINI in the last 168 hours. BNP (last 3 results) Recent Labs    04/24/17 1450  PROBNP 246.0*   HbA1C: Recent Labs    06/04/17 0648  HGBA1C 5.6   CBG: No results for input(s): GLUCAP in the last 168 hours. Lipid Profile: Recent Labs    06/04/17 0626  CHOL 176  HDL 31*  LDLCALC 122*  TRIG 113    CHOLHDL 5.7   Thyroid Function Tests: No results for input(s): TSH, T4TOTAL, FREET4, T3FREE, THYROIDAB in the last 72 hours. Anemia Panel: No results for input(s): VITAMINB12, FOLATE, FERRITIN, TIBC, IRON, RETICCTPCT in the last 72 hours. Urine analysis:    Component Value Date/Time   COLORURINE YELLOW 04/24/2016 0728   APPEARANCEUR CLEAR 04/24/2016 0728   LABSPEC 1.010 04/24/2016 0728   PHURINE 7.5 04/24/2016 0728   GLUCOSEU NEGATIVE 04/24/2016 0728   HGBUR NEGATIVE 04/24/2016 0728   BILIRUBINUR NEGATIVE 04/24/2016 0728   BILIRUBINUR negative 05/31/2015 0910   BILIRUBINUR Negative 04/28/2014 1516   KETONESUR NEGATIVE 04/24/2016 0728   PROTEINUR NEGATIVE 11/22/2015 0633   UROBILINOGEN 0.2 04/24/2016 0728   NITRITE NEGATIVE 04/24/2016 0728   LEUKOCYTESUR TRACE (A) 04/24/2016 0728   Sepsis Labs: Invalid input(s): PROCALCITONIN, LACTICIDVEN  No results found for this or any previous visit (from the past 240 hour(s)).    Radiology Studies: Dg Chest 2 View  Result Date: 06/03/2017 CLINICAL DATA:  Transient ischemic attack. History of cardiac arrhythmia EXAM: CHEST - 2 VIEW COMPARISON:  None. FINDINGS: There is bibasilar fibrotic change with mild lower lobe bronchiectatic change. No edema or consolidation. Heart is mildly enlarged with pulmonary vascularity normal. No adenopathy. There is aortic atherosclerosis. No evident bone lesions. There is calcification in both carotid arteries. IMPRESSION: Bibasilar fibrosis and bronchiectasis. No frank edema or consolidation. Stable cardiac prominence with pulmonary vascularity normal. There is aortic atherosclerosis as well as extensive carotid artery calcification bilaterally. Aortic Atherosclerosis (ICD10-I70.0). Electronically Signed   By: Lowella Grip III M.D.   On: 06/03/2017 15:25   Ct Head Wo Contrast  Result Date: 06/03/2017 CLINICAL DATA:  Left upper extremity weakness EXAM: CT HEAD WITHOUT CONTRAST TECHNIQUE: Contiguous axial  images were obtained from the base of the skull through the vertex without intravenous contrast. COMPARISON:  None. FINDINGS: Brain: There is mild diffuse atrophy. There is no intracranial mass, hemorrhage, extra-axial fluid collection, or midline shift. There is evidence of a prior small infarct in medial superior right frontal lobe. There is patchy small vessel disease in the centra semiovale bilaterally. Elsewhere gray-white compartments appear normal. Vascular: No hyperdense vessels are evident. There is calcification each carotid siphon region and distal vertebral artery. Skull: The bony calvarium appears intact. Sinuses/Orbits: There is mucosal thickening in multiple ethmoid air cells with opacification of anterior ethmoid air cells on the left. Other visualized paranasal sinuses are clear. There is an old healed fracture of the right nasal bone. Orbits appear symmetric bilaterally except for evidence of previous cataract resection on the left. Other: Visualized mastoid air cells are clear. IMPRESSION: Atrophy with periventricular small vessel disease. Prior infarct medial superior right frontal lobe. No acute infarct demonstrable. No mass or hemorrhage evident. Foci arterial vascular calcification. Areas of paranasal sinus disease. Old healed fracture right nasal bone. Electronically Signed   By: Gwyndolyn Saxon  Jasmine December III M.D.   On: 06/03/2017 13:16   Mr Brain Wo Contrast  Result Date: 06/03/2017 CLINICAL DATA:  Acute onset of LEFT arm weakness. EXAM: MRI HEAD WITHOUT CONTRAST MRA HEAD WITHOUT CONTRAST TECHNIQUE: Multiplanar, multiecho pulse sequences of the brain and surrounding structures were obtained without intravenous contrast. Angiographic images of the head were obtained using MRA technique without contrast. COMPARISON:  CT head earlier today. FINDINGS: MRI HEAD FINDINGS Brain: Small foci of restricted diffusion affect the RIGHT posterior frontal cortex and subcortical white matter consistent with  acute infarction. No similar lesions elsewhere. No hemorrhage, mass lesion, or extra-axial fluid. Generalized atrophy. Hydrocephalus ex vacuo. Mild subcortical and periventricular T2 and FLAIR hyperintensities, likely chronic microvascular ischemic change. Old RIGHT ACA infarct affects the medial frontal cortex. Vascular: Normal flow voids. Skull and upper cervical spine: Normal marrow signal. Sinuses/Orbits: Negative. Other: None. MRA HEAD FINDINGS The internal carotid arteries are widely patent. The basilar artery is widely patent with vertebrals codominant. RIGHT A1 ACA widely patent. The distal anterior cerebral arteries are fed exclusively from the RIGHT A1 ACA. The LEFT A1 ACA is diseased or hypoplastic. Both middle cerebral arteries are widely patent. No MCA disease of significance. Both posterior cerebral arteries are widely patent. No cerebellar branch occlusion. No saccular aneurysm. IMPRESSION: Acute RIGHT MCA territory infarct, nonhemorrhagic, affecting the precentral posterior frontal cortex and subcortical white matter. The location would correlate with symptoms of LEFT arm weakness. Atrophy and small vessel disease.  Old RIGHT ACA infarct. MRA demonstrating no ICA or MCA abnormality of significance. Severely diseased hypoplastic LEFT A1 ACA, incidental finding. Electronically Signed   By: Staci Righter M.D.   On: 06/03/2017 19:57   Mr Jodene Nam Head Wo Contrast  Result Date: 06/03/2017 CLINICAL DATA:  Acute onset of LEFT arm weakness. EXAM: MRI HEAD WITHOUT CONTRAST MRA HEAD WITHOUT CONTRAST TECHNIQUE: Multiplanar, multiecho pulse sequences of the brain and surrounding structures were obtained without intravenous contrast. Angiographic images of the head were obtained using MRA technique without contrast. COMPARISON:  CT head earlier today. FINDINGS: MRI HEAD FINDINGS Brain: Small foci of restricted diffusion affect the RIGHT posterior frontal cortex and subcortical white matter consistent with acute  infarction. No similar lesions elsewhere. No hemorrhage, mass lesion, or extra-axial fluid. Generalized atrophy. Hydrocephalus ex vacuo. Mild subcortical and periventricular T2 and FLAIR hyperintensities, likely chronic microvascular ischemic change. Old RIGHT ACA infarct affects the medial frontal cortex. Vascular: Normal flow voids. Skull and upper cervical spine: Normal marrow signal. Sinuses/Orbits: Negative. Other: None. MRA HEAD FINDINGS The internal carotid arteries are widely patent. The basilar artery is widely patent with vertebrals codominant. RIGHT A1 ACA widely patent. The distal anterior cerebral arteries are fed exclusively from the RIGHT A1 ACA. The LEFT A1 ACA is diseased or hypoplastic. Both middle cerebral arteries are widely patent. No MCA disease of significance. Both posterior cerebral arteries are widely patent. No cerebellar branch occlusion. No saccular aneurysm. IMPRESSION: Acute RIGHT MCA territory infarct, nonhemorrhagic, affecting the precentral posterior frontal cortex and subcortical white matter. The location would correlate with symptoms of LEFT arm weakness. Atrophy and small vessel disease.  Old RIGHT ACA infarct. MRA demonstrating no ICA or MCA abnormality of significance. Severely diseased hypoplastic LEFT A1 ACA, incidental finding. Electronically Signed   By: Staci Righter M.D.   On: 06/03/2017 19:57     Scheduled Meds: .  stroke: mapping our early stages of recovery book   Does not apply Once  . aspirin EC  81 mg Oral Daily  .  atorvastatin  40 mg Oral q1800  . cholecalciferol  1,000 Units Oral Daily  . clidinium-chlordiazePOXIDE  1 capsule Oral BID  . enoxaparin (LOVENOX) injection  30 mg Subcutaneous Q24H  . famotidine  20 mg Oral QHS  . feeding supplement (ENSURE ENLIVE)  237 mL Oral BID BM  . gabapentin  300 mg Oral QHS  . pantoprazole  40 mg Oral Daily   Continuous Infusions:  Marzetta Board, MD, PhD [facility] Pager [phone]  If 7PM-7AM,  please contact night-coverage [email or web address] Password [email or web address] 06/04/2017, 1:27 PM

## 2017-06-04 NOTE — Evaluation (Signed)
Physical Therapy Evaluation Patient Details Name: Chad [name] MRN: [id number] DOB: [birth year] Today's Date: 06/04/2017   History of Present Illness  84 year old male with acute onset left arm ataxia and dysarthria. Stroke work up underway.  Clinical Impression  Orders received for PT evaluation. Patient demonstrates deficits in functional mobility as indicated below. Will benefit from continued skilled PT to address deficits and maximize function. Will see as indicated and progress as tolerated.      Follow Up Recommendations Home health PT;Supervision for mobility/OOB    Equipment Recommendations  None recommended by PT    Recommendations for Other Services       Precautions / Restrictions Precautions Precautions: Fall      Mobility  Bed Mobility Overal bed mobility: Modified Independent             General bed mobility comments: increased time to perform  Transfers Overall transfer level: Needs assistance Equipment used: Straight cane Transfers: Sit to/from Stand Sit to Stand: Min guard         General transfer comment: min guard for safety, no physical assist required  Ambulation/Gait Ambulation/Gait assistance: Min guard Ambulation Distance (Feet): 180 Feet Assistive device: Straight cane Gait Pattern/deviations: Step-through pattern;Decreased stride length;Drifts right/left Gait velocity: decreas4ed Gait velocity interpretation: <1.8 ft/sec, indicate of risk for recurrent falls General Gait Details: some noted instability, no overt LOB   Stairs            Wheelchair Mobility    Modified Rankin (Stroke Patients Only) Modified Rankin (Stroke Patients Only) Pre-Morbid Rankin Score: No symptoms Modified Rankin: Slight disability     Balance Overall balance assessment: Needs assistance Sitting-balance support: Feet supported Sitting balance-Leahy Scale: Good Sitting balance - Comments: able to perfrom dynamic tasks   Standing balance  support: During functional activity Standing balance-Leahy Scale: Fair Standing balance comment: able to stand without UE support             High level balance activites: Backward walking;Direction changes;Turns;Head turns High Level Balance Comments: modest instability, poor speed change, no overt LOB noted             Pertinent Vitals/Pain Pain Assessment: No/denies pain    Home Living Family/patient expects to be discharged to:: Private residence Living Arrangements: Spouse/significant other;Other relatives Available Help at Discharge: Family;Friend(s);Available PRN/intermittently Type of Home: House Home Access: Ramped entrance     Home Layout: One level Home Equipment: Shower seat - built in;Cane - single point;Grab bars - toilet      Prior Function Level of Independence: Independent with assistive device(s)   Gait / Transfers Assistance Needed: pt uses cane for mobility  ADL's / Homemaking Assistance Needed: pt reports that he is independnet with all ADLs, IADLs and home mgt        Hand Dominance   Dominant Hand: Right    Extremity/Trunk Assessment   Upper Extremity Assessment Upper Extremity Assessment: LUE deficits/detail LUE Deficits / Details: modest assymetry proximally 4+/5    Lower Extremity Assessment Lower Extremity Assessment: Overall WFL for tasks assessed       Communication   Communication: HOH  Cognition Arousal/Alertness: Awake/alert Behavior During Therapy: WFL for tasks assessed/performed Overall Cognitive Status: Within Functional Limits for tasks assessed                                        General Comments      Exercises  Assessment/Plan    PT Assessment Patient needs continued PT services  PT Problem List Decreased strength;Decreased activity tolerance;Decreased balance;Decreased mobility       PT Treatment Interventions DME instruction;Gait training;Functional mobility training;Therapeutic  activities;Therapeutic exercise;Balance training;Patient/family education    PT Goals (Current goals can be found in the Care Plan section)  Acute Rehab PT Goals Patient Stated Goal: to go home PT Goal Formulation: With patient Time For Goal Achievement: 06/18/17 Potential to Achieve Goals: Good    Frequency Min 3X/week   Barriers to discharge        Co-evaluation               AM-PAC PT "6 Clicks" Daily Activity  Outcome Measure Difficulty turning over in bed (including adjusting bedclothes, sheets and blankets)?: A Little Difficulty moving from lying on back to sitting on the side of the bed? : A Little Difficulty sitting down on and standing up from a chair with arms (e.g., wheelchair, bedside commode, etc,.)?: A Little Help needed moving to and from a bed to chair (including a wheelchair)?: A Little Help needed walking in hospital room?: A Little Help needed climbing 3-5 steps with a railing? : A Little 6 Click Score: 18    End of Session Equipment Utilized During Treatment: Gait belt Activity Tolerance: Patient tolerated treatment well Patient left: in chair;with call bell/phone within reach Nurse Communication: Mobility status PT Visit Diagnosis: Unsteadiness on feet (R26.81)    Time: 1023-1050 PT Time Calculation (min) (ACUTE ONLY): 27 min   Charges:   PT Evaluation $PT Eval Moderate Complexity: 1 Mod     PT G Codes:        Alben Deeds, PT DPT  Board Certified Neurologic Specialist Chad [name] 06/04/2017, 10:52 AM

## 2017-06-04 NOTE — Progress Notes (Signed)
Echocardiogram 2D Echocardiogram has been performed.  Chad [name] 06/04/2017, 3:10 PM

## 2017-06-04 NOTE — Evaluation (Signed)
Occupational Therapy Evaluation Patient Details Name: Chad [name] MRN: [id number] DOB: [birth year] Today's Date: 06/04/2017    History of Present Illness 84 year old male with acute onset left arm ataxia and dysarthria. Stroke work up underway.   Clinical Impression   Pt with decline in function and safety with ADLs and ADL mobility with impairments in balance and mobility. Pt would benefit from acute OT services to address impairments to maximize level of function and safety    Follow Up Recommendations  No OT follow up;Supervision - Intermittent    Equipment Recommendations  None recommended by OT    Recommendations for Other Services       Precautions / Restrictions Precautions Precautions: Fall Restrictions Weight Bearing Restrictions: No      Mobility Bed Mobility Overal bed mobility: Modified Independent             General bed mobility comments: increased time to perform  Transfers Overall transfer level: Needs assistance Equipment used: Straight cane Transfers: Sit to/from Stand Sit to Stand: Min guard         General transfer comment: min guard for safety, no physical assist required    Balance Overall balance assessment: Needs assistance Sitting-balance support: Feet supported Sitting balance-Leahy Scale: Good Sitting balance - Comments: able to perfrom dynamic tasks   Standing balance support: During functional activity Standing balance-Leahy Scale: Fair Standing balance comment: able to stand without UE support             High level balance activites: Backward walking;Direction changes;Turns;Head turns High Level Balance Comments: modest instability, poor speed change, no overt LOB noted           ADL either performed or assessed with clinical judgement   ADL Overall ADL's : Needs assistance/impaired Eating/Feeding: Independent;Sitting   Grooming: Wash/dry hands;Wash/dry face;Min guard;Standing   Upper Body Bathing: Set  up   Lower Body Bathing: Min guard   Upper Body Dressing : Set up   Lower Body Dressing: Min guard   Toilet Transfer: Min guard;Ambulation;Regular Toilet;Grab bars   Toileting- Clothing Manipulation and Hygiene: Supervision/safety;Sit to/from stand   Tub/ Shower Transfer: Min guard;Ambulation;Grab bars   Functional mobility during ADLs: Min guard General ADL Comments: min guard A for safety     Vision Baseline Vision/History: Wears glasses Wears Glasses: At all times Patient Visual Report: No change from baseline       Perception     Praxis      Pertinent Vitals/Pain Pain Assessment: No/denies pain     Hand Dominance Right   Extremity/Trunk Assessment Upper Extremity Assessment Upper Extremity Assessment: LUE deficits/detail LUE Deficits / Details: modest assymetry proximally 4+/5   Lower Extremity Assessment Lower Extremity Assessment: Overall WFL for tasks assessed       Communication Communication Communication: HOH   Cognition Arousal/Alertness: Awake/alert Behavior During Therapy: WFL for tasks assessed/performed Overall Cognitive Status: Within Functional Limits for tasks assessed                                     General Comments       Exercises     Shoulder Instructions      Home Living Family/patient expects to be discharged to:: Private residence Living Arrangements: Spouse/significant other;Other relatives Available Help at Discharge: Family;Friend(s);Available PRN/intermittently Type of Home: House Home Access: Ramped entrance     Home Layout: One level     Bathroom Shower/Tub: Walk-in shower  Bathroom Toilet: Standard     Home Equipment: Shower seat - built in;Cane - single point;Grab bars - toilet          Prior Functioning/Environment Level of Independence: Independent with assistive device(s)  Gait / Transfers Assistance Needed: pt uses cane for mobility ADL's / Homemaking Assistance Needed: pt  reports that he is independnet with all ADLs, IADLs and home mgt            OT Problem List: Decreased activity tolerance;Decreased knowledge of use of DME or AE;Impaired balance (sitting and/or standing)      OT Treatment/Interventions: Self-care/ADL training;Therapeutic exercise;Balance training;DME and/or AE instruction;Neuromuscular education;Therapeutic activities;Patient/family education    OT Goals(Current goals can be found in the care plan section) Acute Rehab OT Goals Patient Stated Goal: to go home OT Goal Formulation: With patient Time For Goal Achievement: 06/18/17 Potential to Achieve Goals: Good ADL Goals Pt Will Perform Grooming: with set-up;with supervision;standing Pt Will Perform Lower Body Bathing: with set-up;with supervision;sit to/from stand Pt Will Perform Lower Body Dressing: with supervision;with set-up;sit to/from stand Pt Will Transfer to Toilet: with supervision;with modified independence;ambulating;regular height toilet;grab bars Pt Will Perform Toileting - Clothing Manipulation and hygiene: with supervision;with modified independence;sit to/from stand Pt Will Perform Tub/Shower Transfer: with supervision;ambulating;with caregiver independent in assisting  OT Frequency: Min 2X/week   Barriers to D/C:    no barriers       Co-evaluation              AM-PAC PT "6 Clicks" Daily Activity     Outcome Measure Help from another person eating meals?: None Help from another person taking care of personal grooming?: A Little Help from another person toileting, which includes using toliet, bedpan, or urinal?: A Little Help from another person bathing (including washing, rinsing, drying)?: A Little Help from another person to put on and taking off regular upper body clothing?: None Help from another person to put on and taking off regular lower body clothing?: A Little 6 Click Score: 20   End of Session Equipment Utilized During Treatment: Gait  belt  Activity Tolerance: Patient tolerated treatment well Patient left: in chair;with call bell/phone within reach  OT Visit Diagnosis: Unsteadiness on feet (R26.81);Muscle weakness (generalized) (M62.81)                Time: 1937-9024 OT Time Calculation (min): 26 min Charges:  OT Evaluation $OT Eval Moderate Complexity: 1 Mod G-Codes: OT G-codes **NOT FOR INPATIENT CLASS** Functional Assessment Tool Used: AM-PAC 6 Clicks Daily Activity     Britt Bottom 06/04/2017, 2:07 PM

## 2017-06-04 NOTE — Progress Notes (Signed)
*  PRELIMINARY RESULTS* Vascular Ultrasound Carotid Duplex (Doppler) has been completed.  Findings suggest 1-39% internal carotid artery stenosis bilaterally. Vertebral arteries are patent with antegrade flow.  06/04/2017 4:06 PM Maudry Mayhew, BS, RVT, RDCS, RDMS

## 2017-06-04 NOTE — Progress Notes (Signed)
Initial Nutrition Assessment  DOCUMENTATION CODES:   Not applicable  INTERVENTION:   Ensure Enlive po BID, each supplement provides 350 kcal and 20 grams of protein  Multivitamin with Minerals  NUTRITION DIAGNOSIS:   Increased nutrient needs related to acute illness as evidenced by estimated needs.  GOAL:   Patient will meet greater than or equal to 90% of their needs  MONITOR:   PO intake, Supplement acceptance, I & O's, Labs  REASON FOR ASSESSMENT:   Consult Assessment of nutrition requirement/status  ASSESSMENT:   Chad [name] is an 84 yo male with PMH COPD, presents with clumsy left arm and dysarthria, acute stroke, CTH found old left frontal stroke as well.  RD consulted to see patient for CVA. Spoke with Chad [name] at bedside. He reports a usual intake of:  Breakfast:  2 scrambled eggs with cheese and ensure with 2 cups of coffee or raisin bran and ensure with 2 cups of coffee or frozen waffles and ensure with 2 cups of coffee  Lunch: Normally eats at [facility] [employer] cafeteria with friends, will have soup, vegetables, and a dessert  Dinner: May eat a light dinner of peanut butter crackers, potato sticks, and cookies if he ate a big lunch or he will eat whatever his wife cooks.   He reports a UBW of 185-193 pounds with recent weight gain. Recently went to his PCP and was 199 pounds. Had eaten 50% of lunch during RD visit, he states "it's hospital food." Did not seem to care much for the food, but was thankful to have ensure ordered for him.  Complete NFPE, patient exhibits some muscle wasting and fat depletions but appears to be related to aging. No concerns for malnutrition at this time.  No other needs at this time.  Labs reviewed Medications reviewed and include:  Vitamin D  NUTRITION - FOCUSED PHYSICAL EXAM:    Most Recent Value  Orbital Region  Moderate depletion  Upper Arm Region  No depletion  Thoracic and Lumbar Region  No depletion  Buccal Region   Mild depletion  Temple Region  Moderate depletion  Clavicle Bone Region  Mild depletion  Clavicle and Acromion Bone Region  No depletion  Scapular Bone Region  Mild depletion  Dorsal Hand  Mild depletion  Patellar Region  No depletion  Anterior Thigh Region  No depletion  Posterior Calf Region  No depletion       Diet Order:   Diet Order           Diet Heart Room service appropriate? Yes; Fluid consistency: Thin  Diet effective now          EDUCATION NEEDS:   Not appropriate for education at this time  Skin:  Skin Assessment: Reviewed RN Assessment  Last BM:  06/03/2017  Height:   Ht Readings from Last 1 Encounters:  05/27/17 6\' 1"  (1.854 m)    Weight:   Wt Readings from Last 1 Encounters:  05/27/17 191 lb (86.6 kg)    Ideal Body Weight:  83.63 kg  Estimated Nutritional Needs:   Kcal:  1950-2100 (MSJ x1.2-1.4)  Protein:  104-121 grams (1.2-1.4g/kg)  Fluid:  >2L  Chad Anis.  Grosser, MS, RD LDN Inpatient Clinical Dietitian Pager [phone]

## 2017-06-05 ENCOUNTER — Encounter: Payer: Self-pay | Admitting: Family Medicine

## 2017-06-05 DIAGNOSIS — I10 Essential (primary) hypertension: Secondary | ICD-10-CM

## 2017-06-05 DIAGNOSIS — J479 Bronchiectasis, uncomplicated: Secondary | ICD-10-CM

## 2017-06-05 LAB — CBC
HCT: 35.4 % — ABNORMAL LOW (ref 39.0–52.0)
HEMOGLOBIN: 11.2 g/dL — AB (ref 13.0–17.0)
MCH: 27.5 pg (ref 26.0–34.0)
MCHC: 31.6 g/dL (ref 30.0–36.0)
MCV: 87 fL (ref 78.0–100.0)
PLATELETS: 210 10*3/uL (ref 150–400)
RBC: 4.07 MIL/uL — AB (ref 4.22–5.81)
RDW: 14.8 % (ref 11.5–15.5)
WBC: 6.6 10*3/uL (ref 4.0–10.5)

## 2017-06-05 MED ORDER — ATORVASTATIN CALCIUM 40 MG PO TABS: 40.0000 mg | ORAL_TABLET | Freq: Every day | ORAL | 1 refills | Status: DC

## 2017-06-05 MED ORDER — APIXABAN 2.5 MG PO TABS: 2.5000 mg | ORAL_TABLET | Freq: Two times a day (BID) | ORAL | 1 refills | Status: DC

## 2017-06-05 NOTE — Discharge Summary (Signed)
Physician Discharge Summary  Chad [name] LKT:[id number] DOB: 1931/07/07 DOA: 06/03/2017  PCP: Cassandria Anger, MD  Admit date: 06/03/2017 Discharge date: 06/05/2017  Admitted From: home Disposition:  home  Recommendations for Outpatient Follow-up:  1. Follow up with PCP in 1-2 weeks 2. Follow up with Dr. Leonie Man in 6 weeks 3. Follow up with Dr. Stanford Breed in 2-3 weeks   Home Health: PT Equipment/Devices: cane  Discharge Condition: stable CODE STATUS: Full code Diet recommendation: heart healthy  HPI: Per Dr. Lorin Mercy, Chad [name] is a 84 y.o. male with a Past Medical History of remote oral cancer; IBS; HTN; GERD; COPD; and atrial flutter not on [facility] who presents with LE weakness and dyscoordination with acute onset today about 11AM.  He [place] drinking water under an umbrella watching the golfers when the symptoms started.  He denies dysarthria or dysphagia.  At this time, his symptoms have resolved completely and he is pleasant and reports feeling well.   Hospital Course: Acute CVA -MRI on admission with nonhemorragic acute right MCA territory infarct, affecting the precentral posterior frontal cortex and subcortical white matter. Neurology was consulted and followed patient while hospitalized. Lipid panel showed an LDL of 122, continue statin. Hemoglobin A1c 5.6. 2D echo showing normal EF 55-60%, grade 3 DD and AS (full report below) Hypertension -Allow permissive hypertension, gradually normalize Chronic diastolic CHF - euvolemic on discharge AS -followed regularly as an outpatient, no chest pain, able to do all his ADLs, appears asymptomatic.  Hyperlipidemia -Continue statin History of a flutter -Admission EKG appears to be in rate controlled Atrial fibrillation, he converted and remained in sinus rhythm on telemetry throughout his stay. Patient tells me that he gets palpitations once in a while including an episode 2-3 months ago.Patient's  CHA2DS2-VASc Score for Stroke Risk is > 2, start on Eliquis, renal adjusted.  COPD/bronchiectasis -Former smoker, stable, not hypoxic CKD stage III-IV -Cr slightly worse in the past few months, 1.8 in April, now 2.3 on admit improving to 2.1 on discharge. Will hold ARB on discharge and recommend repeat BMP in 1-2 weeks as an outpatient.    Discharge Diagnoses:  Principal Problem:   Stroke-like symptoms Active Problems:   Essential hypertension   GERD   Atrial flutter (HCC)   Aortic stenosis   Bronchiectasis without complication (HCC)   Cerebral embolism with cerebral infarction   CVA (cerebral vascular accident) [facility])  Discharge Instructions  Allergies as of 06/05/2017      Reactions   Dyazide [hydrochlorothiazide W-triamterene] Other (See Comments)   Caused hyponatremia 04/2012   Lisinopril Other (See Comments)   cough      Medication List    STOP taking these medications   valsartan 80 MG tablet Commonly known as:  DIOVAN     TAKE these medications   apixaban 2.5 MG Tabs tablet Commonly known as:  ELIQUIS Take 1 tablet (2.5 mg total) by mouth 2 (two) times daily.   atorvastatin 40 MG tablet Commonly known as:  LIPITOR Take 1 tablet (40 mg total) by mouth daily at 6 PM.   cholecalciferol 1000 units tablet Commonly known as:  VITAMIN D Take 1 tablet (1,000 Units total) by mouth daily.   clidinium-chlordiazePOXIDE 5-2.5 MG capsule Commonly known as:  LIBRAX TAKE 1 CAPSULE BY MOUTH TWICE DAILY   cyanocobalamin 1000 MCG tablet Take 1,000 mcg by mouth daily as needed (supplement).   diltiazem 120 MG 24 hr capsule Commonly known as:  CARDIZEM CD Take 1 capsule (120 mg total) by mouth at bedtime.   famotidine 20 MG tablet Commonly known as:  PEPCID One at bedtime What changed:    how much to take  how to take this  when to take this  additional instructions   fluticasone 50 MCG/ACT nasal spray Commonly known as:  FLONASE Place 2 sprays into both  nostrils daily.   gabapentin 300 MG capsule Commonly known as:  NEURONTIN Take 3 capsules at bedtime. What changed:    how much to take  how to take this  when to take this  additional instructions   multivitamin-lutein Caps capsule Take 1 capsule by mouth daily.   pantoprazole 40 MG tablet Commonly known as:  PROTONIX Take 1 tablet (40 mg total) by mouth daily. Take 30-60 min before first meal of the day   polyethylene glycol powder powder Commonly known as:  GLYCOLAX/MIRALAX Take 17 g by mouth 2 (two) times daily as needed.      Follow-up Information    Garvin Fila, MD. Schedule an appointment as soon as possible for a visit in 6 week(s).   Specialties:  Neurology, Radiology Contact information: [address] [place] [address] [phone]        Lelon Perla, MD. Schedule an appointment as soon as possible for a visit in 2 week(s).   Specialty:  Cardiology Contact information: [address] [place] [address] [phone]           Consultations:  Neurology   Procedures/Studies:  2D echo Study Conclusions - Left ventricle: The cavity size was normal. There was mild concentric hypertrophy. Systolic function was normal. The estimated ejection fraction was in the range of 55% to 60%. Hypokinesis of the basal-midanteroseptal and inferoseptal myocardium. Doppler parameters are consistent with a reversible restrictive pattern, indicative of decreased left ventricular diastolic compliance and/or increased left atrial pressure (grade 3 diastolic dysfunction). - Aortic valve: Valve mobility was restricted. Transvalvular velocity was within the normal range. There was no stenosis. There was no regurgitation. Valve area (VTI): 2.75 cm^2. Valve area (Vmax): 3.08 cm^2. Valve area (Vmean): 3.6 cm^2. - Mitral valve: Calcified annulus. Transvalvular velocity was within the normal range. There was no evidence for stenosis. There  was mild regurgitation. - Left atrium: The atrium was mildly dilated. - Right ventricle: Systolic function was normal. - Tricuspid valve: There was moderate-severe regurgitation. - Pulmonic valve: There was moderate regurgitation. - Pulmonary arteries: Systolic pressure was mildly increased. PA peak pressure: 45 mm Hg (S).   Impressions: - Visually, there appears to be severe aortic stenosis. However, gradients across the aortic valve are consistent with mild aortic stenosis. Consider repeating limited echo and looking at right parasternal views, TEE or cath if clinically indicated.  Dg Chest 2 View  Result Date: 06/03/2017 CLINICAL DATA:  Transient ischemic attack. History of cardiac arrhythmia EXAM: CHEST - 2 VIEW COMPARISON:  None. FINDINGS: There is bibasilar fibrotic change with mild lower lobe bronchiectatic change. No edema or consolidation. Heart is mildly enlarged with pulmonary vascularity normal. No adenopathy. There is aortic atherosclerosis. No evident bone lesions. There is calcification in both carotid arteries. IMPRESSION: Bibasilar fibrosis and bronchiectasis. No frank edema or consolidation. Stable cardiac prominence with pulmonary vascularity normal. There is aortic atherosclerosis as well as extensive carotid artery calcification bilaterally. Aortic Atherosclerosis (ICD10-I70.0). Electronically Signed   By: Lowella Grip III M.D.   On: 06/03/2017 15:25   Ct Head Wo Contrast  Result Date: 06/03/2017 CLINICAL DATA:  Left upper extremity weakness EXAM: CT HEAD WITHOUT CONTRAST TECHNIQUE: Contiguous axial images were obtained from the base of the skull through the vertex without intravenous contrast. COMPARISON:  None. FINDINGS: Brain: There is mild diffuse atrophy. There is no intracranial mass, hemorrhage, extra-axial fluid collection, or midline shift. There is evidence of a prior small infarct in medial superior right frontal lobe. There is patchy small vessel disease in the  centra semiovale bilaterally. Elsewhere gray-white compartments appear normal. Vascular: No hyperdense vessels are evident. There is calcification each carotid siphon region and distal vertebral artery. Skull: The bony calvarium appears intact. Sinuses/Orbits: There is mucosal thickening in multiple ethmoid air cells with opacification of anterior ethmoid air cells on the left. Other visualized paranasal sinuses are clear. There is an old healed fracture of the right nasal bone. Orbits appear symmetric bilaterally except for evidence of previous cataract resection on the left. Other: Visualized mastoid air cells are clear. IMPRESSION: Atrophy with periventricular small vessel disease. Prior infarct medial superior right frontal lobe. No acute infarct demonstrable. No mass or hemorrhage evident. Foci arterial vascular calcification. Areas of paranasal sinus disease. Old healed fracture right nasal bone. Electronically Signed   By: Lowella Grip III M.D.   On: 06/03/2017 13:16   Mr Brain Wo Contrast  Result Date: 06/03/2017 CLINICAL DATA:  Acute onset of LEFT arm weakness. EXAM: MRI HEAD WITHOUT CONTRAST MRA HEAD WITHOUT CONTRAST TECHNIQUE: Multiplanar, multiecho pulse sequences of the brain and surrounding structures were obtained without intravenous contrast. Angiographic images of the head were obtained using MRA technique without contrast. COMPARISON:  CT head earlier today. FINDINGS: MRI HEAD FINDINGS Brain: Small foci of restricted diffusion affect the RIGHT posterior frontal cortex and subcortical white matter consistent with acute infarction. No similar lesions elsewhere. No hemorrhage, mass lesion, or extra-axial fluid. Generalized atrophy. Hydrocephalus ex vacuo. Mild subcortical and periventricular T2 and FLAIR hyperintensities, likely chronic microvascular ischemic change. Old RIGHT ACA infarct affects the medial frontal cortex. Vascular: Normal flow voids. Skull and upper cervical spine: Normal  marrow signal. Sinuses/Orbits: Negative. Other: None. MRA HEAD FINDINGS The internal carotid arteries are widely patent. The basilar artery is widely patent with vertebrals codominant. RIGHT A1 ACA widely patent. The distal anterior cerebral arteries are fed exclusively from the RIGHT A1 ACA. The LEFT A1 ACA is diseased or hypoplastic. Both middle cerebral arteries are widely patent. No MCA disease of significance. Both posterior cerebral arteries are widely patent. No cerebellar branch occlusion. No saccular aneurysm. IMPRESSION: Acute RIGHT MCA territory infarct, nonhemorrhagic, affecting the precentral posterior frontal cortex and subcortical white matter. The location would correlate with symptoms of LEFT arm weakness. Atrophy and small vessel disease.  Old RIGHT ACA infarct. MRA demonstrating no ICA or MCA abnormality of significance. Severely diseased hypoplastic LEFT A1 ACA, incidental finding. Electronically Signed   By: Staci Righter M.D.   On: 06/03/2017 19:57   Mr Jodene Nam Head Wo Contrast  Result Date: 06/03/2017 CLINICAL DATA:  Acute onset of LEFT arm weakness. EXAM: MRI HEAD WITHOUT CONTRAST MRA HEAD WITHOUT CONTRAST TECHNIQUE: Multiplanar, multiecho pulse sequences of the brain and surrounding structures were obtained without intravenous contrast. Angiographic images of the head were obtained using MRA technique without contrast. COMPARISON:  CT head earlier today. FINDINGS: MRI HEAD FINDINGS Brain: Small foci of restricted diffusion affect the RIGHT posterior frontal cortex and subcortical white matter consistent with acute infarction. No similar lesions elsewhere. No hemorrhage, mass lesion, or extra-axial fluid. Generalized atrophy.  Hydrocephalus ex vacuo. Mild subcortical and periventricular T2 and FLAIR hyperintensities, likely chronic microvascular ischemic change. Old RIGHT ACA infarct affects the medial frontal cortex. Vascular: Normal flow voids. Skull and upper cervical spine: Normal marrow  signal. Sinuses/Orbits: Negative. Other: None. MRA HEAD FINDINGS The internal carotid arteries are widely patent. The basilar artery is widely patent with vertebrals codominant. RIGHT A1 ACA widely patent. The distal anterior cerebral arteries are fed exclusively from the RIGHT A1 ACA. The LEFT A1 ACA is diseased or hypoplastic. Both middle cerebral arteries are widely patent. No MCA disease of significance. Both posterior cerebral arteries are widely patent. No cerebellar branch occlusion. No saccular aneurysm. IMPRESSION: Acute RIGHT MCA territory infarct, nonhemorrhagic, affecting the precentral posterior frontal cortex and subcortical white matter. The location would correlate with symptoms of LEFT arm weakness. Atrophy and small vessel disease.  Old RIGHT ACA infarct. MRA demonstrating no ICA or MCA abnormality of significance. Severely diseased hypoplastic LEFT A1 ACA, incidental finding. Electronically Signed   By: Staci Righter M.D.   On: 06/03/2017 19:57      Subjective: - no chest pain, shortness of breath, no abdominal pain, nausea or vomiting.   Discharge Exam: Vitals:   06/05/17 0353 06/05/17 0815  BP: 136/71   Pulse: 65   Resp: 18   Temp: 97.9 F (36.6 C) 97.9 F (36.6 C)  SpO2: 96%     General: Pt is alert, awake, not in acute distress Cardiovascular: RRR, 3/6 SEM Respiratory: CTA bilaterally, no wheezing, no rhonchi Abdominal: Soft, NT, ND, bowel sounds + Extremities: no edema, no cyanosis    The results of significant diagnostics from this hospitalization (including imaging, microbiology, ancillary and laboratory) are listed below for reference.     Microbiology: No results found for this or any previous visit (from the past 240 hour(s)).   Labs: BNP (last 3 results) No results for input(s): BNP in the last 8760 hours. Basic Metabolic Panel: Recent Labs  Lab 06/03/17 1249 06/03/17 1256  NA 134* 137  K 4.6 4.6  CL 104 102  CO2 22  --   GLUCOSE 93 88  BUN  17 18  CREATININE 2.30* 2.10*  CALCIUM 8.5*  --    Liver Function Tests: Recent Labs  Lab 06/03/17 1249  AST 21  ALT 12*  ALKPHOS 59  BILITOT 0.4  PROT 6.7  ALBUMIN 3.1*   No results for input(s): LIPASE, AMYLASE in the last 168 hours. No results for input(s): AMMONIA in the last 168 hours. CBC: Recent Labs  Lab 06/03/17 1249 06/03/17 1256 06/05/17 0628  WBC 9.7  --  6.6  NEUTROABS 7.1  --   --   HGB 11.0* 11.6* 11.2*  HCT 34.2* 34.0* 35.4*  MCV 85.9  --  87.0  PLT 221  --  210   Cardiac Enzymes: No results for input(s): CKTOTAL, CKMB, CKMBINDEX, TROPONINI in the last 168 hours. BNP: Invalid input(s): POCBNP CBG: No results for input(s): GLUCAP in the last 168 hours. D-Dimer No results for input(s): DDIMER in the last 72 hours. Hgb A1c Recent Labs    06/04/17 0648  HGBA1C 5.6   Lipid Profile Recent Labs    06/04/17 0626  CHOL 176  HDL 31*  LDLCALC 122*  TRIG 113  CHOLHDL 5.7   Thyroid function studies No results for input(s): TSH, T4TOTAL, T3FREE, THYROIDAB in the last 72 hours.  Invalid input(s): FREET3 Anemia work up No results for input(s): VITAMINB12, FOLATE, FERRITIN, TIBC, IRON, RETICCTPCT in the last  72 hours. Urinalysis    Component Value Date/Time   COLORURINE YELLOW 04/24/2016 0728   APPEARANCEUR CLEAR 04/24/2016 0728   LABSPEC 1.010 04/24/2016 0728   PHURINE 7.5 04/24/2016 0728   GLUCOSEU NEGATIVE 04/24/2016 0728   HGBUR NEGATIVE 04/24/2016 0728   BILIRUBINUR NEGATIVE 04/24/2016 0728   BILIRUBINUR negative 05/31/2015 0910   BILIRUBINUR Negative 04/28/2014 1516   KETONESUR NEGATIVE 04/24/2016 0728   PROTEINUR NEGATIVE 11/22/2015 0633   UROBILINOGEN 0.2 04/24/2016 0728   NITRITE NEGATIVE 04/24/2016 0728   LEUKOCYTESUR TRACE (A) 04/24/2016 0728   Sepsis Labs Invalid input(s): PROCALCITONIN,  WBC,  LACTICIDVEN   Time coordinating discharge: 45 minutes  SIGNED:  Marzetta Board, MD  [facility] 06/05/2017, 10:46  AM Pager [phone]  If 7PM-7AM, please contact night-coverage [email or web address] Password [email or web address]

## 2017-06-05 NOTE — Progress Notes (Signed)
Physical Therapy Treatment Patient Details Name: Chad [name] MRN: [id number] DOB: 1931-11-04 Today's Date: 06/05/2017    History of Present Illness 84 year old male with acute onset left arm ataxia and dysarthria. Stroke work up underway.    PT Comments    Patient seen for mobility progression in preparation for discharge home. Patient mobilizing well with SPC today. Patient planning to discharge home today.  Follow Up Recommendations  Home health PT;Supervision for mobility/OOB     Equipment Recommendations  None recommended by PT    Recommendations for Other Services       Precautions / Restrictions Precautions Precautions: Fall Restrictions Weight Bearing Restrictions: No    Mobility  Bed Mobility Overal bed mobility: Modified Independent             General bed mobility comments: increased time to perform  Transfers Overall transfer level: Needs assistance Equipment used: Straight cane Transfers: Sit to/from Stand Sit to Stand: Independent         General transfer comment: no need for assist today  Ambulation/Gait Ambulation/Gait assistance: Modified independent (Device/Increase time) Ambulation Distance (Feet): 160 Feet Assistive device: Straight cane Gait Pattern/deviations: Step-through pattern;Decreased stride length;Drifts right/left     General Gait Details: some noted instability, no overt LOB    Stairs             Wheelchair Mobility    Modified Rankin (Stroke Patients Only) Modified Rankin (Stroke Patients Only) Pre-Morbid Rankin Score: No symptoms Modified Rankin: Slight disability     Balance Overall balance assessment: Needs assistance Sitting-balance support: Feet supported Sitting balance-Leahy Scale: Good Sitting balance - Comments: able to perfrom dynamic tasks   Standing balance support: During functional activity Standing balance-Leahy Scale: Good Standing balance comment: stood to perform dressing and  functional tasks                            Cognition Arousal/Alertness: Awake/alert Behavior During Therapy: WFL for tasks assessed/performed Overall Cognitive Status: Within Functional Limits for tasks assessed                                        Exercises      General Comments        Pertinent Vitals/Pain Pain Assessment: No/denies pain    Home Living                      Prior Function            PT Goals (current goals can now be found in the care plan section) Acute Rehab PT Goals Patient Stated Goal: to go home PT Goal Formulation: With patient Time For Goal Achievement: 06/18/17 Potential to Achieve Goals: Good Progress towards PT goals: Progressing toward goals    Frequency    Min 3X/week      PT Plan Current plan remains appropriate    Co-evaluation              AM-PAC PT "6 Clicks" Daily Activity  Outcome Measure  Difficulty turning over in bed (including adjusting bedclothes, sheets and blankets)?: A Little Difficulty moving from lying on back to sitting on the side of the bed? : A Little Difficulty sitting down on and standing up from a chair with arms (e.g., wheelchair, bedside commode, etc,.)?: A Little Help needed moving to and from a bed  to chair (including a wheelchair)?: A Little Help needed walking in hospital room?: A Little Help needed climbing 3-5 steps with a railing? : A Little 6 Click Score: 18    End of Session Equipment Utilized During Treatment: Gait belt Activity Tolerance: Patient tolerated treatment well Patient left: in chair;with call bell/phone within reach Nurse Communication: Mobility status PT Visit Diagnosis: Unsteadiness on feet (R26.81)     Time: 7121-9758 PT Time Calculation (min) (ACUTE ONLY): 12 min  Charges:  $Gait Training: 8-22 mins                    G Codes:       Alben Deeds, PT DPT  Board Certified Neurologic Specialist [phone]    Duncan Dull 06/05/2017, 11:28 AM

## 2017-06-05 NOTE — Progress Notes (Signed)
Occupational Therapy Treatment Patient Details Name: Chad [name] MRN: [id number] DOB: December 26, 1931 Today's Date: 06/05/2017    History of present illness 84 year old male with acute onset left arm ataxia and dysarthria. Stroke work up underway.   OT comments  Pt making good progress with functional goals. Pt to d/c home this afternoon  Follow Up Recommendations  No OT follow up;Supervision - Intermittent    Equipment Recommendations  None recommended by OT    Recommendations for Other Services      Precautions / Restrictions Precautions Precautions: Fall Restrictions Weight Bearing Restrictions: No       Mobility Bed Mobility Overal bed mobility: Modified Independent             General bed mobility comments: increased time to perform  Transfers Overall transfer level: Needs assistance Equipment used: Straight cane Transfers: Sit to/from Stand Sit to Stand: Independent         General transfer comment: no need for assist today    Balance Overall balance assessment: Needs assistance Sitting-balance support: Feet supported Sitting balance-Leahy Scale: Good Sitting balance - Comments: able to perfrom dynamic tasks   Standing balance support: During functional activity Standing balance-Leahy Scale: Good Standing balance comment: stood to perform dressing and functional tasks                           ADL either performed or assessed with clinical judgement   ADL Overall ADL's : Needs assistance/impaired     Grooming: Wash/dry hands;Wash/dry face;Standing;Supervision/safety           Upper Body Dressing : Set up;Standing   Lower Body Dressing: Supervision/safety;Set up;Sit to/from stand   Toilet Transfer: Ambulation;Regular Toilet;Grab bars;Independent   Toileting- Clothing Manipulation and Hygiene: Sit to/from stand;Independent   Tub/ Shower Transfer: Ambulation;Grab bars;Supervision/safety   Functional mobility during ADLs:  Independent;Supervision/safety       Vision Patient Visual Report: No change from baseline     Perception     Praxis      Cognition Arousal/Alertness: Awake/alert Behavior During Therapy: WFL for tasks assessed/performed Overall Cognitive Status: Within Functional Limits for tasks assessed                                          Exercises     Shoulder Instructions       General Comments      Pertinent Vitals/ Pain       Pain Assessment: No/denies pain  Home Living     Available Help at Discharge: (P) Family;Friend(s);Available PRN/intermittently Type of Home: (P) House                              Lives With: (P) Significant other    Prior Functioning/Environment              Frequency  Min 2X/week        Progress Toward Goals  OT Goals(current goals can now be found in the care plan section)  Progress towards OT goals: Progressing toward goals  Acute Rehab OT Goals Patient Stated Goal: to go home  Plan      Co-evaluation                 AM-PAC PT "6 Clicks" Daily Activity     Outcome Measure  Help from another person eating meals?: None Help from another person taking care of personal grooming?: None Help from another person toileting, which includes using toliet, bedpan, or urinal?: None Help from another person bathing (including washing, rinsing, drying)?: A Little Help from another person to put on and taking off regular upper body clothing?: None Help from another person to put on and taking off regular lower body clothing?: A Little 6 Click Score: 22    End of Session    OT Visit Diagnosis: Unsteadiness on feet (R26.81);Muscle weakness (generalized) (M62.81)   Activity Tolerance Patient tolerated treatment well   Patient Left in chair;with call bell/phone within reach   Nurse Communication      Functional Assessment Tool Used: AM-PAC 6 Clicks Daily Activity   Time: 6004-5997 OT Time  Calculation (min): 25 min  Charges: OT G-codes **NOT FOR INPATIENT CLASS** Functional Assessment Tool Used: AM-PAC 6 Clicks Daily Activity OT General Charges $OT Visit: 1 Visit OT Treatments $Self Care/Home Management : 23-37 mins     Britt Bottom 06/05/2017, 11:39 AM

## 2017-06-05 NOTE — Progress Notes (Addendum)
STROKE TEAM PROGRESS NOTE  INTERVAL HISTORY Patient doing word finds in the bed. No changes since yesterday. Hopeful for d/c today. Discussed reasoning for Eliquis choice over Xarelto, which he was on before (renal). He is excited to go home. Agreeable to [facility] as recommended.  Vitals:   06/04/17 2007 06/04/17 2343 06/05/17 0353 06/05/17 0815  BP: (!) 130/93 131/89 136/71   Pulse: 90 93 65   Resp: 18 16 18    Temp: 97.9 F (36.6 C) 98 F (36.7 C) 97.9 F (36.6 C) 97.9 F (36.6 C)  TempSrc: Oral Oral Oral Oral  SpO2: 99% 95% 96%     CBC:  Recent Labs  Lab 06/03/17 1249 06/03/17 1256 06/05/17 0628  WBC 9.7  --  6.6  NEUTROABS 7.1  --   --   HGB 11.0* 11.6* 11.2*  HCT 34.2* 34.0* 35.4*  MCV 85.9  --  87.0  PLT 221  --  540    Basic Metabolic Panel:  Recent Labs  Lab 06/03/17 1249 06/03/17 1256  NA 134* 137  K 4.6 4.6  CL 104 102  CO2 22  --   GLUCOSE 93 88  BUN 17 18  CREATININE 2.30* 2.10*  CALCIUM 8.5*  --    Lipid Panel:     Component Value Date/Time   CHOL 176 06/04/2017 0626   TRIG 113 06/04/2017 0626   HDL 31 (L) 06/04/2017 0626   CHOLHDL 5.7 06/04/2017 0626   VLDL 23 06/04/2017 0626   LDLCALC 122 (H) 06/04/2017 0626   HgbA1c:  Lab Results  Component Value Date   HGBA1C 5.6 06/04/2017   Urine Drug Screen: No results found for: LABOPIA, COCAINSCRNUR, LABBENZ, AMPHETMU, THCU, LABBARB  Alcohol Level No results found for: [facility]  IMAGING Dg Chest 2 View  Result Date: 06/03/2017 CLINICAL DATA:  Transient ischemic attack. History of cardiac arrhythmia EXAM: CHEST - 2 VIEW COMPARISON:  None. FINDINGS: There is bibasilar fibrotic change with mild lower lobe bronchiectatic change. No edema or consolidation. Heart is mildly enlarged with pulmonary vascularity normal. No adenopathy. There is aortic atherosclerosis. No evident bone lesions. There is calcification in both carotid arteries. IMPRESSION: Bibasilar fibrosis and bronchiectasis. No frank edema or  consolidation. Stable cardiac prominence with pulmonary vascularity normal. There is aortic atherosclerosis as well as extensive carotid artery calcification bilaterally. Aortic Atherosclerosis (ICD10-I70.0). Electronically Signed   By: Lowella Grip III M.D.   On: 06/03/2017 15:25   Ct Head Wo Contrast  Result Date: 06/03/2017 CLINICAL DATA:  Left upper extremity weakness EXAM: CT HEAD WITHOUT CONTRAST TECHNIQUE: Contiguous axial images were obtained from the base of the skull through the vertex without intravenous contrast. COMPARISON:  None. FINDINGS: Brain: There is mild diffuse atrophy. There is no intracranial mass, hemorrhage, extra-axial fluid collection, or midline shift. There is evidence of a prior small infarct in medial superior right frontal lobe. There is patchy small vessel disease in the centra semiovale bilaterally. Elsewhere gray-white compartments appear normal. Vascular: No hyperdense vessels are evident. There is calcification each carotid siphon region and distal vertebral artery. Skull: The bony calvarium appears intact. Sinuses/Orbits: There is mucosal thickening in multiple ethmoid air cells with opacification of anterior ethmoid air cells on the left. Other visualized paranasal sinuses are clear. There is an old healed fracture of the right nasal bone. Orbits appear symmetric bilaterally except for evidence of previous cataract resection on the left. Other: Visualized mastoid air cells are clear. IMPRESSION: Atrophy with periventricular small vessel disease. Prior infarct medial  superior right frontal lobe. No acute infarct demonstrable. No mass or hemorrhage evident. Foci arterial vascular calcification. Areas of paranasal sinus disease. Old healed fracture right nasal bone. Electronically Signed   By: Lowella Grip III M.D.   On: 06/03/2017 13:16   Mr Brain Wo Contrast  Result Date: 06/03/2017 CLINICAL DATA:  Acute onset of LEFT arm weakness. EXAM: MRI HEAD WITHOUT  CONTRAST MRA HEAD WITHOUT CONTRAST TECHNIQUE: Multiplanar, multiecho pulse sequences of the brain and surrounding structures were obtained without intravenous contrast. Angiographic images of the head were obtained using MRA technique without contrast. COMPARISON:  CT head earlier today. FINDINGS: MRI HEAD FINDINGS Brain: Small foci of restricted diffusion affect the RIGHT posterior frontal cortex and subcortical white matter consistent with acute infarction. No similar lesions elsewhere. No hemorrhage, mass lesion, or extra-axial fluid. Generalized atrophy. Hydrocephalus ex vacuo. Mild subcortical and periventricular T2 and FLAIR hyperintensities, likely chronic microvascular ischemic change. Old RIGHT ACA infarct affects the medial frontal cortex. Vascular: Normal flow voids. Skull and upper cervical spine: Normal marrow signal. Sinuses/Orbits: Negative. Other: None. MRA HEAD FINDINGS The internal carotid arteries are widely patent. The basilar artery is widely patent with vertebrals codominant. RIGHT A1 ACA widely patent. The distal anterior cerebral arteries are fed exclusively from the RIGHT A1 ACA. The LEFT A1 ACA is diseased or hypoplastic. Both middle cerebral arteries are widely patent. No MCA disease of significance. Both posterior cerebral arteries are widely patent. No cerebellar branch occlusion. No saccular aneurysm. IMPRESSION: Acute RIGHT MCA territory infarct, nonhemorrhagic, affecting the precentral posterior frontal cortex and subcortical white matter. The location would correlate with symptoms of LEFT arm weakness. Atrophy and small vessel disease.  Old RIGHT ACA infarct. MRA demonstrating no ICA or MCA abnormality of significance. Severely diseased hypoplastic LEFT A1 ACA, incidental finding. Electronically Signed   By: Staci Righter M.D.   On: 06/03/2017 19:57   Mr Jodene Nam Head Wo Contrast  Result Date: 06/03/2017 CLINICAL DATA:  Acute onset of LEFT arm weakness. EXAM: MRI HEAD WITHOUT CONTRAST  MRA HEAD WITHOUT CONTRAST TECHNIQUE: Multiplanar, multiecho pulse sequences of the brain and surrounding structures were obtained without intravenous contrast. Angiographic images of the head were obtained using MRA technique without contrast. COMPARISON:  CT head earlier today. FINDINGS: MRI HEAD FINDINGS Brain: Small foci of restricted diffusion affect the RIGHT posterior frontal cortex and subcortical white matter consistent with acute infarction. No similar lesions elsewhere. No hemorrhage, mass lesion, or extra-axial fluid. Generalized atrophy. Hydrocephalus ex vacuo. Mild subcortical and periventricular T2 and FLAIR hyperintensities, likely chronic microvascular ischemic change. Old RIGHT ACA infarct affects the medial frontal cortex. Vascular: Normal flow voids. Skull and upper cervical spine: Normal marrow signal. Sinuses/Orbits: Negative. Other: None. MRA HEAD FINDINGS The internal carotid arteries are widely patent. The basilar artery is widely patent with vertebrals codominant. RIGHT A1 ACA widely patent. The distal anterior cerebral arteries are fed exclusively from the RIGHT A1 ACA. The LEFT A1 ACA is diseased or hypoplastic. Both middle cerebral arteries are widely patent. No MCA disease of significance. Both posterior cerebral arteries are widely patent. No cerebellar branch occlusion. No saccular aneurysm. IMPRESSION: Acute RIGHT MCA territory infarct, nonhemorrhagic, affecting the precentral posterior frontal cortex and subcortical white matter. The location would correlate with symptoms of LEFT arm weakness. Atrophy and small vessel disease.  Old RIGHT ACA infarct. MRA demonstrating no ICA or MCA abnormality of significance. Severely diseased hypoplastic LEFT A1 ACA, incidental finding. Electronically Signed   By: Roderic Ovens.D.  On: 06/03/2017 19:57   Carotid Doppler   Right Carotid: Velocities in the right ICA are consistent with a 1-39% stenosis. Left Carotid: Velocities in the left ICA  are consistent with a 1-39% stenosis. Vertebrals: Bilateral vertebral arteries demonstrate antegrade flow. Subclavians: Normal flow hemodynamics were seen in bilateral subclavian arteries.  2D Echocardiogram  - Left ventricle: The cavity size was normal. There was mild concentric hypertrophy. Systolic function was normal. The estimated ejection fraction was in the range of 55% to 60%. Hypokinesis of the basal-midanteroseptal and inferoseptal myocardium. Doppler parameters are consistent with a reversible restrictive pattern, indicative of decreased left ventricular diastolic compliance and/or increased left atrial pressure (grade 3 diastolic dysfunction). - Aortic valve: Valve mobility was restricted. Transvalvular velocity was within the normal range. There was no stenosis. There was no regurgitation. Valve area (VTI): 2.75 cm^2. Valve area (Vmax): 3.08 cm^2. Valve area (Vmean): 3.6 cm^2. - Mitral valve: Calcified annulus. Transvalvular velocity was within the normal range. There was no evidence for stenosis. There was mild regurgitation. - Left atrium: The atrium was mildly dilated. - Right ventricle: Systolic function was normal. - Tricuspid valve: There was moderate-severe regurgitation. - Pulmonic valve: There was moderate regurgitation. - Pulmonary arteries: Systolic pressure was mildly increased. PA peak pressure: 45 mm Hg (S). Impressions:  Visually, there appears to be severe aortic stenosis. However, gradients across the aortic valve are consistent with mild aortic stenosis. Consider repeating limited echo and looking at right parasternal views, TEE or cath if clinically indicated.  PHYSICAL EXAM Pleasant elderly Caucasian male currently not in distress. Afebrile. Head is nontraumatic. Neck is supple without bruit.  Distal pulses are well felt. Neurological Exam ;  Patient alert and oriented x 3. Speech clear. No aphasia. No dysarthria. Extraoccular movements intact. Visual fields full.  Face symmetric. Tongue midline. Moves all extremities x 4. Strength normal. Coordination normal except for mild left finger-to-nose dysmetria. Sensation intact. Gait not tested.   ASSESSMENT/PLAN Mr. Chad [name] is a 84 y.o. male with history of AF s/p ablation in 2014 not on AC since that time, HTN, HLD, COPD, GERD, oral cancer, OS macular degeneration, CKD and former smoker presenting with L arm and handweakness.   Stroke:  right MCA cortical and subcortical infarcts embolic secondary to known atrial fibrillation  CT head Small vessel disease. Atrophy. Old R frontal infarct. Paranasal sinus dx. Old healed fx R nasal bone.   MRI  R MCA precentral posterior frontal cortex and subcortical white matter. Small vessel disease. Atrophy. Old R ACA infarct.   MRA  Hypoplastic L A1 ACA  Carotid Doppler  B ICA 1-39% stenosis, VAs antegrade   2D Echo  EF 55-60%. No source of embolus. Mild vs severe aortic stenosis  LDL 122  HgbA1c 5.6  Lovenox 30 mg sq daily for VTE prophylaxis  No antithrombotic prior to admission, now on Eliquis (apixaban) daily. Given AF hx, recommend life long anticoagulation for secondary stroke prevention.   Therapy recommendations:  HH PT, no OT  Disposition:  return home NOTHING FURTHER TO ADD FROM THE STROKE STANDPOINT Ongoing risk factor control by Primary Care Physician Stroke Service will sign off. Please call should any needs arise. Follow-up Stroke Clinic at [facility] in 4 weeks, order placed.    Hx Atrial Flutter s/p ablation 2014 Hx Atrial tachycardia  Home anticoagulation:  No antithrombotic. Was on xarleto post ablation, off in 2014  CHA2DS2-VASc Score = at least 6, ?2 oral anticoagulation recommended  Age in Years:  ?  64   +2    Sex:  Male   Male   +1    Hypertension History:  yes   +1     Diabetes Mellitus:  0  Congestive Heart Failure History:  0  Vascular Disease History:  0     Stroke/TIA/Thromboembolism History:   yes   +2  Recommend life long anticoagulation for secondary stroke prevention. Eliquis favored given CKD and ability to dose adjust. Continue at d/c. Can do coumadin if needed in future if worsening renal condition.  Hypertension  Stable . BP goal normotensive  Hyperlipidemia  Home meds:  No statin  LDL 122, goal < 70  Now on Lipitor 40  Continue statin at discharge  Other Stroke Risk Factors  Advanced age  Former Cigarette smoker, quit 21 yrs ago  Known aortic stenosis  Other Active Problems  COPD/bronchiectasis  Hospital day # [place], MSN, APRN, ANVP-BC, AGPCNP-BC Advanced Practice Stroke Nurse [place] for Schedule & Pager information 06/05/2017 8:41 AM  Patient is ready for discharge. Follow-up as an outpatient in the stroke clinic in 6 weeks. Discussed with Dr. Beatris Si, MD Medical Director [place] Pager: [phone] 06/05/2017 3:48 PM. To contact Stroke Continuity provider, please refer to [email or web address] After hours, contact General Neurology

## 2017-06-05 NOTE — Progress Notes (Signed)
Discharge instructions given to patient. Pt. Overwhelmed and confused with scheduling follow ups and ensuring he is taking the right meds. Gave further med details on AVS. Called each of the patient's physician offices and set up appointments. Pt. Given updated discharge paperwork. Await arrival of the patient's ride.

## 2017-06-05 NOTE — Evaluation (Signed)
Speech Language Pathology Evaluation Patient Details Name: Chad [name] MRN: [id number] DOB: 10-13-1931 Today's Date: 06/05/2017 Time: 5053-9767 SLP Time Calculation (min) (ACUTE ONLY): 19 min  Problem List:  Patient Active Problem List   Diagnosis Date Noted  . Cerebral embolism with cerebral infarction 06/04/2017  . CVA (cerebral vascular accident) ([place]) 06/04/2017  . Stroke-like symptoms 06/03/2017  . Bronchiectasis without complication ([place]) 34/19/3790  . DOE (dyspnea on exertion) 04/24/2017  . Cough 02/13/2017  . Flatulence 02/13/2017  . Hemoptysis 06/12/2016  . Creatinine elevation 06/12/2016  . Eyebrow laceration, right, sequela 05/18/2016  . Abnormal TSH 05/18/2016  . Well adult exam 04/20/2016  . Enlarged prostate 04/20/2016  . Community acquired pneumonia   . Pneumonia of both lungs due to infectious organism 03/02/2016  . 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 Anal lesion 06/07/2015  . Gas 01/18/2015  . Aortic stenosis 06/08/2013  . Unsteadiness 02/11/2013  . Ectopic atrial tachycardia ([place]) 01/12/2013  . Atrial tachycardia ([place]) 01/12/2013  . Atrial flutter ([place]) 05/06/2012  . Aortic valve sclerosis 05/06/2012  . Acute renal failure ([place]) 05/05/2012  . 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 Anemia 07/27/2010  . Essential hypertension 11/15/2008  . COPD 11/15/2008  . GERD 11/15/2008  . IBS 11/15/2008  . Midsternal chest pain 11/15/2008   Past Medical History:  Past Medical History:  Diagnosis Date  . Abnormal EKG    ST changes with normal coronaries by cath 05/06/12.  . Arthritis    "hands" (01/12/2013); feet (podiatry consultation)  . Atrial flutter ([place])    a. Slow ~100bpm when in 2:1; dx 04/2012. b. Placed on Xarelto.  c. s/p ablation 07-08-2012 by Dr Rayann Heman  . Atrial tachycardia ([place])   . Carotid artery calcification    By CXR - dopplers 05/06/12 without obvious evidence of  significant ICA stenosis >40%  . Colon polyps 06/26/2005  . COPD (chronic obstructive pulmonary disease) ([place])   . Diverticulosis of colon (without mention of hemorrhage)   . Dysrhythmia   . Gastritis, chronic    Pt denies bleeding 04/2012. EGD 2012 reportedly normal.  . GERD (gastroesophageal reflux disease)   . Hypertension   . Hyponatremia    04/2012 r/t diuretic.  . IBS (irritable bowel syndrome)   . Internal and external hemorrhoids without complication   . Macular degeneration of left eye   . Oral cancer ([place]) 02/1997  . Pneumonia    "couple times" (01/12/2013)  . Shortness of breath dyspnea    walking distances   Past Surgical History:  Past Surgical History:  Procedure Laterality Date  . ATRIAL FLUTTER ABLATION  07-08-2012   of typical atrial flutter by Dr Rayann Heman  . ATRIAL FLUTTER ABLATION N/A 07/08/2012   Procedure: ATRIAL FLUTTER ABLATION;  Surgeon: Thompson Grayer, MD;  Location[facility] C[facility] LAB;  Service: Cardiovascular;  Laterality: N/A;  . CARDIAC CATHETERIZATION  04/2012  . COLONOSCOPY  08/29/10   diverticulosis, internal hemorrhoids  . ESOPHAGOGASTRODUODENOSCOPY  09/20/10   normal  . EYE SURGERY  01/08/2014   Cataract resection L.    [name] [place]  . LEFT HEART CATHETERIZATION WITH CORONARY ANGIOGRAM N/A 05/06/2012   Procedure: LEFT HEART CATHETERIZATION WITH CORONARY ANGIOGRAM;  Surgeon: Peter M Martinique, MD;  Location: [facility] CATH LAB;  Service: Cardiovascular;  Laterality: N/A;  . 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 TRIGGER FINGER RELEASE Right 1970's   "2 fingers"  .  WART FULGURATION Left 09/15/2015   Procedure: EXCISIONal biospy of left peri anual and anual canal mass;  Surgeon: Greer Pickerel, MD;  Location: [facility] ORS;  Service: General;  Laterality: Left;   HPI:  84 y.o. male with history of AF s/p ablation in 2014 not on AC since that time, HTN, HLD, COPD, GERD, oral cancer, OS macular degeneration, CKD and former smoker presenting with L arm and  handweakness.  Imaging reveals right MCA cortical and subcortical infarcts embolic secondary to known atrial fibrillation   Assessment / Plan / Recommendation Clinical Impression  [name] is a socially active, engaged 84 y.o. who presents with better than anticipated cognitive/linguistic function given neuro hx.  Expression/comprehension are WNL.  Working Marine scientist, attention, and problem solving are WFL.  Pragmatics/understanding of nuances of social interaction are a strength.  No SLP f/u needs are identified - our services will sign off.     SLP Assessment  SLP Recommendation/Assessment: Patient does not need any further Speech Lanaguage Pathology Services    Follow Up Recommendations       Frequency and Duration           SLP Evaluation Cognition  Overall Cognitive Status: Within Functional Limits for tasks assessed Arousal/Alertness: Awake/alert Orientation Level: Oriented X4 Attention: Alternating Alternating Attention: Appears intact Memory: Appears intact Awareness: Appears intact Problem Solving: Appears intact       Comprehension  Auditory Comprehension Overall Auditory Comprehension: Appears within functional limits for tasks assessed Visual Recognition/Discrimination Discrimination: Within Function Limits Reading Comprehension Reading Status: Within funtional limits    Expression Expression Primary Mode of Expression: Verbal Verbal Expression Overall Verbal Expression: Appears within functional limits for tasks assessed   Oral / Motor  Motor Speech Overall Motor Speech: Appears within functional limits for tasks assessed   GO                    Chad [name] 06/05/2017, 11:39 AM

## 2017-06-05 NOTE — Discharge Instructions (Signed)
Follow with Plotnikov, Evie Lacks, MD in 5-7 days  Please get a complete blood count and chemistry panel checked by your Primary MD at your next visit, and again as instructed by your Primary MD. Please get your medications reviewed and adjusted by your Primary MD.  Please request your Primary MD to go over all Hospital Tests and Procedure/Radiological results at the follow up, please get all Hospital records sent to your Prim MD by signing hospital release before you go home.  If you had Pneumonia of Lung problems at the Hospital: Please get a 2 view Chest X ray done in 6-8 weeks after hospital discharge or sooner if instructed by your Primary MD.  If you have Congestive Heart Failure: Please call your Cardiologist or Primary MD anytime you have any of the following symptoms:  1) 3 pound weight gain in 24 hours or 5 pounds in 1 week  2) shortness of breath, with or without a dry hacking cough  3) swelling in the hands, feet or stomach  4) if you have to sleep on extra pillows at night in order to breathe  Follow cardiac low salt diet and 1.5 lit/day fluid restriction.  If you have diabetes Accuchecks 4 times/day, Once in AM empty stomach and then before each meal. Log in all results and show them to your primary doctor at your next visit. If any glucose reading is under 80 or above 300 call your primary MD immediately.  If you have Seizure/Convulsions/Epilepsy: Please do not drive, operate heavy machinery, participate in activities at heights or participate in high speed sports until you have seen by Primary MD or a Neurologist and advised to do so again.  If you had Gastrointestinal Bleeding: Please ask your Primary MD to check a complete blood count within one week of discharge or at your next visit. Your endoscopic/colonoscopic biopsies that are pending at the time of discharge, will also need to followed by your Primary MD.  Get Medicines reviewed and adjusted. Please take all your  medications with you for your next visit with your Primary MD  Please request your Primary MD to go over all hospital tests and procedure/radiological results at the follow up, please ask your Primary MD to get all Hospital records sent to his/her office.  If you experience worsening of your admission symptoms, develop shortness of breath, life threatening emergency, suicidal or homicidal thoughts you must seek medical attention immediately by calling 911 or calling your MD immediately  if symptoms less severe.  You must read complete instructions/literature along with all the possible adverse reactions/side effects for all the Medicines you take and that have been prescribed to you. Take any new Medicines after you have completely understood and accpet all the possible adverse reactions/side effects.   Do not drive or operate heavy machinery when taking Pain medications.   Do not take more than prescribed Pain, Sleep and Anxiety Medications  Special Instructions: If you have smoked or chewed Tobacco  in the last 2 yrs please stop smoking, stop any regular Alcohol  and or any Recreational drug use.  Wear Seat belts while driving.  Please note You were cared for by a hospitalist during your hospital stay. If you have any questions about your discharge medications or the care you received while you were in the hospital after you are discharged, you can call the unit and asked to speak with the hospitalist on call if the hospitalist that took care of you is not available.  Once you are discharged, your primary care physician will handle any further medical issues. Please note that NO REFILLS for any discharge medications will be authorized once you are discharged, as it is imperative that you return to your primary care physician (or establish a relationship with a primary care physician if you do not have one) for your aftercare needs so that they can reassess your need for medications and monitor your  lab values.  You can reach the hospitalist office at phone [phone] or fax [phone]   If you do not have a primary care physician, you can call [phone] for a physician referral.  Activity: As tolerated with Full fall precautions use walker/cane & assistance as needed  Diet: heart healthy  Disposition Home     Information on my medicine - ELIQUIS (apixaban)  This medication education was reviewed with me or my healthcare representative as part of my discharge preparation.   Why was Eliquis prescribed for you? Eliquis was prescribed for you to reduce the risk of a blood clot forming that can cause a stroke if you have a medical condition called atrial fibrillation (a type of irregular heartbeat).  What do You need to know about Eliquis ? Take your Eliquis TWICE DAILY - one tablet in the morning and one tablet in the evening with or without food. If you have difficulty swallowing the tablet whole please discuss with your pharmacist how to take the medication safely.  Take Eliquis exactly as prescribed by your doctor and DO NOT stop taking Eliquis without talking to the doctor who prescribed the medication.  Stopping may increase your risk of developing a stroke.  Refill your prescription before you run out.  After discharge, you should have regular check-up appointments with your healthcare provider that is prescribing your Eliquis.  In the future your dose may need to be changed if your kidney function or weight changes by a significant amount or as you get older.  What do you do if you miss a dose? If you miss a dose, take it as soon as you remember on the same day and resume taking twice daily.  Do not take more than one dose of ELIQUIS at the same time to make up a missed dose.  Important Safety Information A possible side effect of Eliquis is bleeding. You should call your healthcare provider right away if you experience any of the following: ? Bleeding from an  injury or your nose that does not stop. ? Unusual colored urine (red or dark brown) or unusual colored stools (red or black). ? Unusual bruising for unknown reasons. ? A serious fall or if you hit your head (even if there is no bleeding).  Some medicines may interact with Eliquis and might increase your risk of bleeding or clotting while on Eliquis. To help avoid this, consult your healthcare provider or pharmacist prior to using any new prescription or non-prescription medications, including herbals, vitamins, non-steroidal anti-inflammatory drugs (NSAIDs) and supplements.  This website has more information on Eliquis (apixaban): http://www.eliquis.com/eliquis/home

## 2017-06-05 NOTE — Care Management Note (Addendum)
Case Management Note  Patient Details  Name: Chad [name] MRN: [id number] Date of Birth: [birth year]  Subjective/Objective:   Pt admitted with CVA. He is from home with his ex wife.                 Action/Plan: Pt with orders for [facility] services. CM provided choice and [facility] decided on. Dorian Pod with [facility] notified and accepted the referral.  Pt states he has transportation home.  Pt stated on Eliquis. CM provided him the 30 day free card. Benefits check submitted but not resulted. Pt states his [facility] and [employer] scripts usually cover his meds.    Expected Discharge Date:  06/05/17               Expected Discharge Plan:  [place]  In-House Referral:     Discharge planning Services  CM Consult  Post Acute Care Choice:    Choice offered to:  Patient  DME Arranged:    DME Agency:     HH Arranged:  PT HH Agency:  [facility]  Status of Service:  Completed, signed off  If discussed at [place] of Stay Meetings, dates discussed:    Additional Comments:  Pollie Friar, RN 06/05/2017, 12:10 PM

## 2017-06-05 NOTE — Plan of Care (Signed)
Pt. Meeting goals adequately for discharge.

## 2017-06-05 NOTE — Consult Note (Signed)
[facility] CM Primary Care Navigator  06/05/2017  Chad [name] October 23, 1931 [id number]   Went to seepatient in the room to identify possible discharge needsbuthe was already dischargedperstaffreport. Patient was discharged home today with home health services.  Per MD note, patientpresented to the hospital with left arm weakness. (acute CVA)  Primary care provider's officeis listed as providingtransition of care (TOC)follow-up.   Patient has discharge instruction to follow-up withprimary care provider in 1- 2 weeks after discharge.  Noted order for EMMI Stroke calls to follow-up recovery at home already in place.   For additional questions please contact:  Karin Golden A. Christobal Morado, BSN, RN-BC [facility] PRIMARY CARE Navigator Cell: [phone]

## 2017-06-06 ENCOUNTER — Telehealth: Payer: Self-pay | Admitting: *Deleted

## 2017-06-06 NOTE — Telephone Encounter (Signed)
Transition Care Management Follow-up Telephone Call   Date discharged? 06/05/17   How have you been since you were released from the hospital? Pt states he is doing ok   Do you understand why you were in the hospital? YES   Do you understand the discharge instructions? YES   Where were you discharged to? Home   Items Reviewed:  Medications reviewed: YES  Allergies reviewed: YES  Dietary changes reviewed: YES, heart healthy  Referrals reviewed: YES, waiting on appt w/cardiology & Neurology   Functional Questionnaire:   Activities of Daily Living (ADLs):   He states he are independent in the following: ambulation, bathing and hygiene, feeding, continence, grooming, toileting and dressing States they require assistance with the following: ambulation   Any transportation issues/concerns?: NO   Any patient concerns? NO   Confirmed importance and date/time of follow-up visits scheduled YES, appt 06/17/17  Provider Appointment booked with [name]  Confirmed with patient if condition begins to worsen call PCP or go to the ER.  Patient was given the office number and encouraged to call back with question or concerns.  : YES

## 2017-06-11 ENCOUNTER — Inpatient Hospital Stay (HOSPITAL_COMMUNITY)
Admission: EM | Admit: 2017-06-11 | Discharge: 2017-06-14 | DRG: 918 | Disposition: A | Discharge status: Home Health | Payer: Medicare Other | Attending: Family Medicine | Admitting: Family Medicine

## 2017-06-11 ENCOUNTER — Ambulatory Visit: Payer: Self-pay

## 2017-06-11 ENCOUNTER — Encounter (HOSPITAL_COMMUNITY): Payer: Self-pay | Admitting: Emergency Medicine

## 2017-06-11 ENCOUNTER — Other Ambulatory Visit: Payer: Self-pay

## 2017-06-11 ENCOUNTER — Emergency Department (HOSPITAL_COMMUNITY): Payer: Medicare Other

## 2017-06-11 ENCOUNTER — Inpatient Hospital Stay (HOSPITAL_COMMUNITY): Payer: Medicare Other

## 2017-06-11 DIAGNOSIS — K648 Other hemorrhoids: Secondary | ICD-10-CM | POA: Diagnosis present

## 2017-06-11 DIAGNOSIS — Z79899 Other long term (current) drug therapy: Secondary | ICD-10-CM

## 2017-06-11 DIAGNOSIS — N400 Enlarged prostate without lower urinary tract symptoms: Secondary | ICD-10-CM | POA: Diagnosis present

## 2017-06-11 DIAGNOSIS — K589 Irritable bowel syndrome without diarrhea: Secondary | ICD-10-CM | POA: Diagnosis present

## 2017-06-11 DIAGNOSIS — K219 Gastro-esophageal reflux disease without esophagitis: Secondary | ICD-10-CM | POA: Diagnosis present

## 2017-06-11 DIAGNOSIS — J449 Chronic obstructive pulmonary disease, unspecified: Secondary | ICD-10-CM | POA: Diagnosis present

## 2017-06-11 DIAGNOSIS — M545 Low back pain: Secondary | ICD-10-CM | POA: Diagnosis present

## 2017-06-11 DIAGNOSIS — I5032 Chronic diastolic (congestive) heart failure: Secondary | ICD-10-CM | POA: Diagnosis not present

## 2017-06-11 DIAGNOSIS — R402413 Glasgow coma scale score 13-15, at hospital admission: Secondary | ICD-10-CM | POA: Diagnosis present

## 2017-06-11 DIAGNOSIS — Z888 Allergy status to other drugs, medicaments and biological substances status: Secondary | ICD-10-CM

## 2017-06-11 DIAGNOSIS — R001 Bradycardia, unspecified: Secondary | ICD-10-CM | POA: Diagnosis not present

## 2017-06-11 DIAGNOSIS — I440 Atrioventricular block, first degree: Secondary | ICD-10-CM | POA: Diagnosis present

## 2017-06-11 DIAGNOSIS — G8929 Other chronic pain: Secondary | ICD-10-CM | POA: Diagnosis present

## 2017-06-11 DIAGNOSIS — Z8673 Personal history of transient ischemic attack (TIA), and cerebral infarction without residual deficits: Secondary | ICD-10-CM

## 2017-06-11 DIAGNOSIS — M47816 Spondylosis without myelopathy or radiculopathy, lumbar region: Secondary | ICD-10-CM | POA: Diagnosis present

## 2017-06-11 DIAGNOSIS — M19041 Primary osteoarthritis, right hand: Secondary | ICD-10-CM | POA: Diagnosis present

## 2017-06-11 DIAGNOSIS — R531 Weakness: Secondary | ICD-10-CM | POA: Diagnosis not present

## 2017-06-11 DIAGNOSIS — N184 Chronic kidney disease, stage 4 (severe): Secondary | ICD-10-CM | POA: Diagnosis not present

## 2017-06-11 DIAGNOSIS — I471 Supraventricular tachycardia: Secondary | ICD-10-CM | POA: Diagnosis present

## 2017-06-11 DIAGNOSIS — E871 Hypo-osmolality and hyponatremia: Secondary | ICD-10-CM | POA: Diagnosis present

## 2017-06-11 DIAGNOSIS — M19042 Primary osteoarthritis, left hand: Secondary | ICD-10-CM | POA: Diagnosis present

## 2017-06-11 DIAGNOSIS — J431 Panlobular emphysema: Secondary | ICD-10-CM | POA: Diagnosis not present

## 2017-06-11 DIAGNOSIS — I371 Nonrheumatic pulmonary valve insufficiency: Secondary | ICD-10-CM | POA: Diagnosis present

## 2017-06-11 DIAGNOSIS — R079 Chest pain, unspecified: Secondary | ICD-10-CM | POA: Diagnosis not present

## 2017-06-11 DIAGNOSIS — H3530 Unspecified macular degeneration: Secondary | ICD-10-CM | POA: Diagnosis present

## 2017-06-11 DIAGNOSIS — M5137 Other intervertebral disc degeneration, lumbosacral region: Secondary | ICD-10-CM | POA: Diagnosis present

## 2017-06-11 DIAGNOSIS — E785 Hyperlipidemia, unspecified: Secondary | ICD-10-CM | POA: Diagnosis present

## 2017-06-11 DIAGNOSIS — Z86010 Personal history of colonic polyps: Secondary | ICD-10-CM

## 2017-06-11 DIAGNOSIS — M4316 Spondylolisthesis, lumbar region: Secondary | ICD-10-CM | POA: Diagnosis present

## 2017-06-11 DIAGNOSIS — M5136 Other intervertebral disc degeneration, lumbar region: Secondary | ICD-10-CM | POA: Diagnosis not present

## 2017-06-11 DIAGNOSIS — I071 Rheumatic tricuspid insufficiency: Secondary | ICD-10-CM | POA: Diagnosis present

## 2017-06-11 DIAGNOSIS — I495 Sick sinus syndrome: Secondary | ICD-10-CM | POA: Diagnosis not present

## 2017-06-11 DIAGNOSIS — I130 Hypertensive heart and chronic kidney disease with heart failure and stage 1 through stage 4 chronic kidney disease, or unspecified chronic kidney disease: Secondary | ICD-10-CM | POA: Diagnosis present

## 2017-06-11 DIAGNOSIS — I4891 Unspecified atrial fibrillation: Secondary | ICD-10-CM | POA: Diagnosis present

## 2017-06-11 DIAGNOSIS — I10 Essential (primary) hypertension: Secondary | ICD-10-CM | POA: Diagnosis not present

## 2017-06-11 DIAGNOSIS — Z7901 Long term (current) use of anticoagulants: Secondary | ICD-10-CM

## 2017-06-11 DIAGNOSIS — N179 Acute kidney failure, unspecified: Secondary | ICD-10-CM | POA: Diagnosis not present

## 2017-06-11 DIAGNOSIS — I272 Pulmonary hypertension, unspecified: Secondary | ICD-10-CM | POA: Diagnosis present

## 2017-06-11 DIAGNOSIS — M549 Dorsalgia, unspecified: Secondary | ICD-10-CM

## 2017-06-11 DIAGNOSIS — Z85810 Personal history of malignant neoplasm of tongue: Secondary | ICD-10-CM

## 2017-06-11 DIAGNOSIS — R29700 NIHSS score 0: Secondary | ICD-10-CM | POA: Diagnosis present

## 2017-06-11 DIAGNOSIS — I4892 Unspecified atrial flutter: Secondary | ICD-10-CM | POA: Diagnosis not present

## 2017-06-11 DIAGNOSIS — I639 Cerebral infarction, unspecified: Secondary | ICD-10-CM | POA: Diagnosis not present

## 2017-06-11 DIAGNOSIS — R7989 Other specified abnormal findings of blood chemistry: Secondary | ICD-10-CM | POA: Diagnosis present

## 2017-06-11 DIAGNOSIS — I350 Nonrheumatic aortic (valve) stenosis: Secondary | ICD-10-CM | POA: Diagnosis not present

## 2017-06-11 DIAGNOSIS — N189 Chronic kidney disease, unspecified: Secondary | ICD-10-CM

## 2017-06-11 DIAGNOSIS — I959 Hypotension, unspecified: Secondary | ICD-10-CM | POA: Diagnosis present

## 2017-06-11 DIAGNOSIS — T461X1A Poisoning by calcium-channel blockers, accidental (unintentional), initial encounter: Secondary | ICD-10-CM | POA: Diagnosis not present

## 2017-06-11 DIAGNOSIS — Z87891 Personal history of nicotine dependence: Secondary | ICD-10-CM

## 2017-06-11 DIAGNOSIS — E7849 Other hyperlipidemia: Secondary | ICD-10-CM | POA: Diagnosis not present

## 2017-06-11 DIAGNOSIS — I69992 Facial weakness following unspecified cerebrovascular disease: Secondary | ICD-10-CM

## 2017-06-11 HISTORY — DX: Malignant neoplasm of tongue, unspecified: C02.9

## 2017-06-11 LAB — APTT: aPTT: 51 seconds — ABNORMAL HIGH (ref 24–36)

## 2017-06-11 LAB — COMPREHENSIVE METABOLIC PANEL
ALBUMIN: 3.3 g/dL — AB (ref 3.5–5.0)
ALK PHOS: 67 U/L (ref 38–126)
ALT: 15 U/L — AB (ref 17–63)
ANION GAP: 14 (ref 5–15)
AST: 26 U/L (ref 15–41)
BUN: 40 mg/dL — ABNORMAL HIGH (ref 6–20)
CALCIUM: 8.8 mg/dL — AB (ref 8.9–10.3)
CHLORIDE: 97 mmol/L — AB (ref 101–111)
CO2: 20 mmol/L — AB (ref 22–32)
Creatinine, Ser: 4.76 mg/dL — ABNORMAL HIGH (ref 0.61–1.24)
GFR calc Af Amer: 12 mL/min — ABNORMAL LOW (ref >60)
GFR calc non Af Amer: 10 mL/min — ABNORMAL LOW (ref >60)
GLUCOSE: 99 mg/dL (ref 65–99)
Potassium: 4.6 mmol/L (ref 3.5–5.1)
Sodium: 131 mmol/L — ABNORMAL LOW (ref 135–145)
Total Bilirubin: 0.7 mg/dL (ref 0.3–1.2)
Total Protein: 7.2 g/dL (ref 6.5–8.1)

## 2017-06-11 LAB — I-STAT CHEM 8, ED
BUN: 38 mg/dL — AB (ref 6–20)
CHLORIDE: 96 mmol/L — AB (ref 101–111)
CREATININE: 4.7 mg/dL — AB (ref 0.61–1.24)
Calcium, Ion: 1.14 mmol/L — ABNORMAL LOW (ref 1.15–1.40)
Glucose, Bld: 104 mg/dL — ABNORMAL HIGH (ref 65–99)
HEMATOCRIT: 33 % — AB (ref 39.0–52.0)
Hemoglobin: 11.2 g/dL — ABNORMAL LOW (ref 13.0–17.0)
POTASSIUM: 4.5 mmol/L (ref 3.5–5.1)
Sodium: 130 mmol/L — ABNORMAL LOW (ref 135–145)
TCO2: 22 mmol/L (ref 22–32)

## 2017-06-11 LAB — TROPONIN I: Troponin I: 0.03 ng/mL (ref <0.03)

## 2017-06-11 LAB — CBC WITH DIFFERENTIAL/PLATELET
ABS IMMATURE GRANULOCYTES: 0.1 10*3/uL (ref 0.0–0.1)
Basophils Absolute: 0.1 10*3/uL (ref 0.0–0.1)
Basophils Relative: 1 %
EOS ABS: 0.1 10*3/uL (ref 0.0–0.7)
Eosinophils Relative: 0 %
HEMATOCRIT: 34.8 % — AB (ref 39.0–52.0)
HEMOGLOBIN: 10.9 g/dL — AB (ref 13.0–17.0)
IMMATURE GRANULOCYTES: 1 %
LYMPHS ABS: 1.9 10*3/uL (ref 0.7–4.0)
LYMPHS PCT: 16 %
MCH: 27.7 pg (ref 26.0–34.0)
MCHC: 31.3 g/dL (ref 30.0–36.0)
MCV: 88.5 fL (ref 78.0–100.0)
MONOS PCT: 10 %
Monocytes Absolute: 1.2 10*3/uL — ABNORMAL HIGH (ref 0.1–1.0)
NEUTROS PCT: 72 %
Neutro Abs: 8.4 10*3/uL — ABNORMAL HIGH (ref 1.7–7.7)
Platelets: 210 10*3/uL (ref 150–400)
RBC: 3.93 MIL/uL — ABNORMAL LOW (ref 4.22–5.81)
RDW: 15 % (ref 11.5–15.5)
WBC: 11.6 10*3/uL — ABNORMAL HIGH (ref 4.0–10.5)

## 2017-06-11 LAB — URINALYSIS, ROUTINE W REFLEX MICROSCOPIC
BILIRUBIN URINE: NEGATIVE
Glucose, UA: NEGATIVE mg/dL
Hgb urine dipstick: NEGATIVE
KETONES UR: NEGATIVE mg/dL
Leukocytes, UA: NEGATIVE
NITRITE: NEGATIVE
Protein, ur: NEGATIVE mg/dL
Specific Gravity, Urine: 1.005 (ref 1.005–1.030)
pH: 6 (ref 5.0–8.0)

## 2017-06-11 LAB — MRSA PCR SCREENING: MRSA by PCR: NEGATIVE

## 2017-06-11 LAB — MAGNESIUM: MAGNESIUM: 2.3 mg/dL (ref 1.7–2.4)

## 2017-06-11 LAB — OSMOLALITY, URINE: OSMOLALITY UR: 190 mosm/kg — AB (ref 300–900)

## 2017-06-11 LAB — TSH: TSH: 5.393 u[IU]/mL — AB (ref 0.350–4.500)

## 2017-06-11 LAB — SODIUM, URINE, RANDOM: Sodium, Ur: 34 mmol/L

## 2017-06-11 LAB — OSMOLALITY: OSMOLALITY: 284 mosm/kg (ref 275–295)

## 2017-06-11 LAB — HEPARIN LEVEL (UNFRACTIONATED): Heparin Unfractionated: >2.2 IU/mL — ABNORMAL HIGH (ref 0.30–0.70)

## 2017-06-11 MED ORDER — FAMOTIDINE 20 MG PO TABS
20.0000 mg | ORAL_TABLET | Freq: Every day | ORAL | Status: DC
  Administered 2017-06-11 – 2017-06-13 (×3): 20 mg via ORAL
  Filled 2017-06-11 (×3): qty 1

## 2017-06-11 MED ORDER — HEPARIN (PORCINE) IN NACL 100-0.45 UNIT/ML-% IJ SOLN
1300.0000 [IU]/h | INTRAMUSCULAR | Status: DC
Start: 2017-06-11 — End: 2017-06-12
  Administered 2017-06-11: 1300 [IU]/h via INTRAVENOUS
  Filled 2017-06-11: qty 250

## 2017-06-11 MED ORDER — ONDANSETRON HCL 4 MG/2ML IJ SOLN: 4.0000 mg | Freq: Four times a day (QID) | INTRAMUSCULAR | Status: DC | PRN

## 2017-06-11 MED ORDER — GABAPENTIN 300 MG PO CAPS
300.0000 mg | ORAL_CAPSULE | Freq: Three times a day (TID) | ORAL | Status: DC
  Administered 2017-06-11 – 2017-06-14 (×7): 300 mg via ORAL
  Filled 2017-06-11 (×8): qty 1

## 2017-06-11 MED ORDER — PANTOPRAZOLE SODIUM 40 MG PO TBEC
40.0000 mg | DELAYED_RELEASE_TABLET | Freq: Every day | ORAL | Status: DC
  Administered 2017-06-12 – 2017-06-14 (×3): 40 mg via ORAL
  Filled 2017-06-11 (×3): qty 1

## 2017-06-11 MED ORDER — ACETAMINOPHEN 325 MG PO TABS: 650.0000 mg | ORAL_TABLET | Freq: Four times a day (QID) | ORAL | Status: DC | PRN

## 2017-06-11 MED ORDER — SENNOSIDES-DOCUSATE SODIUM 8.6-50 MG PO TABS
1.0000 | ORAL_TABLET | Freq: Every day | ORAL | Status: DC
  Administered 2017-06-12 – 2017-06-13 (×2): 1 via ORAL
  Filled 2017-06-11 (×3): qty 1

## 2017-06-11 MED ORDER — ACETAMINOPHEN 650 MG RE SUPP: 650.0000 mg | Freq: Four times a day (QID) | RECTAL | Status: DC | PRN

## 2017-06-11 MED ORDER — VITAMIN E 45 MG (100 UNIT) PO CAPS
100.0000 [IU] | ORAL_CAPSULE | Freq: Every day | ORAL | Status: DC
  Administered 2017-06-12 – 2017-06-14 (×3): 100 [IU] via ORAL
  Filled 2017-06-11 (×3): qty 1

## 2017-06-11 MED ORDER — HEPARIN (PORCINE) IN NACL 100-0.45 UNIT/ML-% IJ SOLN
1300.0000 [IU]/h | INTRAMUSCULAR | Status: DC
  Filled 2017-06-11: qty 250

## 2017-06-11 MED ORDER — ATORVASTATIN CALCIUM 40 MG PO TABS
40.0000 mg | ORAL_TABLET | Freq: Every day | ORAL | Status: DC
  Administered 2017-06-12 – 2017-06-13 (×2): 40 mg via ORAL
  Filled 2017-06-11 (×2): qty 1

## 2017-06-11 MED ORDER — SODIUM CHLORIDE 0.9% FLUSH: 3.0000 mL | Freq: Two times a day (BID) | INTRAVENOUS | Status: DC

## 2017-06-11 MED ORDER — LIDOCAINE 5 % EX PTCH
1.0000 | MEDICATED_PATCH | CUTANEOUS | Status: DC
  Administered 2017-06-12 – 2017-06-13 (×2): 1 via TRANSDERMAL
  Filled 2017-06-11 (×3): qty 1

## 2017-06-11 MED ORDER — CILIDINIUM-CHLORDIAZEPOXIDE 2.5-5 MG PO CAPS
1.0000 | ORAL_CAPSULE | Freq: Two times a day (BID) | ORAL | Status: DC
  Administered 2017-06-12 – 2017-06-14 (×5): 1 via ORAL
  Filled 2017-06-11 (×6): qty 1

## 2017-06-11 MED ORDER — ONDANSETRON HCL 4 MG PO TABS: 4.0000 mg | ORAL_TABLET | Freq: Four times a day (QID) | ORAL | Status: DC | PRN

## 2017-06-11 MED ORDER — SODIUM CHLORIDE 0.9 % IV SOLN
INTRAVENOUS | Status: DC
  Administered 2017-06-11 – 2017-06-13 (×4): via INTRAVENOUS

## 2017-06-11 MED ORDER — FLUTICASONE PROPIONATE 50 MCG/ACT NA SUSP
2.0000 | Freq: Every day | NASAL | Status: DC
  Administered 2017-06-12 – 2017-06-13 (×2): 2 via NASAL
  Filled 2017-06-11: qty 16

## 2017-06-11 NOTE — Progress Notes (Addendum)
ANTICOAGULATION CONSULT NOTE - Initial Consult  Pharmacy Consult for heparin Indication: atrial fibrillation  Allergies  Allergen Reactions  . Dyazide [Hydrochlorothiazide W-Triamterene] Other (See Comments)    Caused hyponatremia 04/2012  . Lisinopril Other (See Comments)    cough    Patient Measurements: Height: 6\' 1"  (185.4 cm) Weight: 191 lb (86.6 kg) IBW/kg (Calculated) : 79.9 Heparin Dosing Weight: 86.6 kg  Vital Signs: Temp: 98.1 F (36.7 C) (06/04 1438) Temp Source: Oral (06/04 1438) BP: 102/37 (06/04 1600) Pulse Rate: 33 (06/04 1600)  Labs: Recent Labs    06/11/17 1454 06/11/17 1517  HGB 10.9* 11.2*  HCT 34.8* 33.0*  PLT 210  --   CREATININE  --  4.70*    Estimated Creatinine Clearance: 13.2 mL/min (A) (by C-G formula based on SCr of 4.7 mg/dL (H)).   Medical History: Past Medical History:  Diagnosis Date  . Abnormal EKG    ST changes with normal coronaries by cath 05/06/12.  . Arthritis    "hands" (01/12/2013); feet (podiatry consultation)  . Atrial flutter ([place])    a. Slow ~100bpm when in 2:1; dx 04/2012. b. Placed on Xarelto.  c. s/p ablation 07-08-2012 by Dr Rayann Heman  . Atrial tachycardia ([place])   . Carotid artery calcification    By CXR - dopplers 05/06/12 without obvious evidence of significant ICA stenosis >40%  . Colon polyps 06/26/2005  . COPD (chronic obstructive pulmonary disease) ([place])   . Diverticulosis of colon (without mention of hemorrhage)   . Dysrhythmia   . Gastritis, chronic    Pt denies bleeding 04/2012. EGD 2012 reportedly normal.  . GERD (gastroesophageal reflux disease)   . Hypertension   . Hyponatremia    04/2012 r/t diuretic.  . IBS (irritable bowel syndrome)   . Internal and external hemorrhoids without complication   . Macular degeneration of left eye   . 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 Pneumonia    "couple times" (01/12/2013)  . Shortness of breath dyspnea    walking distances    Assessment: 61 yoM admitted with bradycardia.  Pt on Eliquis PTA for AFib to transition to heparin per pharmacy while admitted with concern for renal failure. Last dose of apixaban was PTA at 0630 per pt.  Goal of Therapy:  Heparin level 0.3-0.7 units/ml aPTT 60-102s seconds Monitor platelets by anticoagulation protocol: Yes   Plan:  -Initiate heparin with no bolus at 1300 units/hr at 1830 -Check 8-hr aPTT/heparin level -Daily CBC  Arrie Senate, PharmD, BCPS PGY-2 Cardiology Pharmacy Resident Phone: [phone] 06/11/2017

## 2017-06-11 NOTE — H&P (Signed)
History and Physical    MIKELL HOLLAND ZOX:[id number] DOB: 05-Mar-1931 DOA: 06/11/2017  **Will admit patient based on the expectation that the patient will need hospitalization/ hospital care that crosses at least 2 midnights  PCP: Plotnikov, Georgina Quint, MD   Attending physician: Nelson Chimes  Patient coming from/Resides with: Private residence  Chief Complaint: Bradycardia  HPI: Chad [name] is a 84 y.o. male with medical history significant for acute right MCA area ischemic stroke, history of atrial flutter with previous documentation of tachybradycardia syndrome, hypertension, chronic diastolic heart failure, chronic kidney disease stage III, COPD with bronchiectasis, dyslipidemia and lumbar spine osteoarthritis.  Is discharged from the hospital on 5/29 after an admission for stroke as above.  Had associated acute kidney injury and given severity of chronic kidney disease his Diovan was discontinued.  Attending physician during previous admission felt patient was in atrial fibrillation so Cardizem was initiated along with Eliquis given recent stroke.  Patient states that previous stroke symptoms involving the left upper extremity have resolved.  Patient was with physical therapy today with the therapist noted the patient was bradycardic and sent him to the ER for further evaluation.  When I spoke with the patient he denied issues such as dizziness, shortness of breath, chest pain or difficulty mobilizing.  Patient reported to the triage nurse that he was having chest pain stating he accidentally took extra diltiazem and took his valsartan which had been discontinued during the previous admission.  In the ER the patient was normotensive although SBP optimal between 90 and 110.  Heart rates were in the 30s with underlying junctional rhythm.  Creatinine has doubled since discharge 2.1 to 4.76.  It is unknown how long the patient has been bradycardic.  He has been evaluated by cardiology who report current  rhythm is accelerated junctional rhythm.  EKGs from previous admission demonstrate sinus bradycardia with prolonged PR interval and junctional beats and admission EKG during previous admission demonstrated junctional rhythm with a heart rate in the 40s.  Due to the severity of his acute kidney injury and low GFR his previous Eliquis has been changed to IV heparin.  ED Course:  Vital Signs: BP (!) 149/56 (BP Location: Right Arm)   Pulse (!) 36   Temp 98.1 F (36.7 C) (Oral)   Resp 17   Ht 6\' 1"  (1.854 m)   Wt 86.7 kg (191 lb 1.6 oz)   SpO2 100%   BMI 25.21 kg/m  CXR: Patchy infiltrate left base with small left pleural effusion and stable apical pleural thickening Lumbar spine: Unchanged severe degenerative disc disease and grade 1 anterolisthesis at L5-S1 due to bilateral L5 pars defects Lab data: Sodium 131, potassium 4.6, chloride 97, CO2 20, glucose 99, BUN 40, creatinine 4.76, anion gap 14, LFTs not elevated, troponin 0.03, white count 11,600 with neutrophils 72% and absolute neutrophils 8.4%, hemoglobin 10.9, platelets 210,000 Medications and treatments: None  Review of Systems:  In addition to the HPI above,  No Fever-chills, myalgias or other constitutional symptoms No Headache, changes with Vision or hearing, new weakness, tingling, numbness in any extremity, dizziness, dysarthria or word finding difficulty, gait disturbance or imbalance, tremors or seizure activity No problems swallowing food or Liquids, indigestion/reflux, choking or coughing while eating, abdominal pain with or after eating ?Chest pain, No Cough or Shortness of Breath, palpitations, orthopnea or DOE No Abdominal pain, N/V, melena,hematochezia, dark tarry stools, constipation No dysuria, malodorous urine, hematuria or flank pain No new skin rashes, lesions, masses or  bruises, No new joint pains, aches, swelling or redness No recent unintentional weight gain or loss No polyuria, polydypsia or polyphagia   Past  Medical History:  Diagnosis Date  . Abnormal EKG    ST changes with normal coronaries by cath 05/06/12.  . Arthritis    "hands" (01/12/2013); feet (podiatry consultation)  . Atrial flutter (HCC)    a. Slow ~100bpm when in 2:1; dx 04/2012. b. Placed on Xarelto.  c. s/p ablation 07-08-2012 by Dr Johney Frame  . Atrial tachycardia (HCC)   . Carotid artery calcification    By CXR - dopplers 05/06/12 without obvious evidence of significant ICA stenosis >40%  . Colon polyps 06/26/2005  . COPD (chronic obstructive pulmonary disease) (HCC)   . Diverticulosis of colon (without mention of hemorrhage)   . Dysrhythmia   . Gastritis, chronic    Pt denies bleeding 04/2012. EGD 2012 reportedly normal.  . GERD (gastroesophageal reflux disease)   . Hypertension   . Hyponatremia    04/2012 r/t diuretic.  . IBS (irritable bowel syndrome)   . Internal and external hemorrhoids without complication   . Macular degeneration of left eye   . 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 Pneumonia    "couple times" (01/12/2013)  . Shortness of breath dyspnea    walking distances    Past Surgical History:  Procedure Laterality Date  . ATRIAL FLUTTER ABLATION  07-08-2012   of typical atrial flutter by Dr Johney Frame  . ATRIAL FLUTTER ABLATION N/A 07/08/2012   Procedure: ATRIAL FLUTTER ABLATION;  Surgeon: Hillis Range, MD;  Location: [facility] CATH LAB;  Service: Cardiovascular;  Laterality: N/A;  . CARDIAC CATHETERIZATION  04/2012  . COLONOSCOPY  08/29/10   diverticulosis, internal hemorrhoids  . ESOPHAGOGASTRODUODENOSCOPY  09/20/10   normal  . EYE SURGERY  01/08/2014   Cataract resection L.    [name] HEMORRHOID SURGERY  1968  . LEFT HEART CATHETERIZATION WITH CORONARY ANGIOGRAM N/A 05/06/2012   Procedure: LEFT HEART CATHETERIZATION WITH CORONARY ANGIOGRAM;  Surgeon: Peter M Swaziland, MD;  Location: [facility] CATH LAB;  Service: Cardiovascular;  Laterality: N/A;  . 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 TRIGGER FINGER RELEASE Right 1970's   "2  fingers"  . WART FULGURATION Left 09/15/2015   Procedure: EXCISIONal biospy of left peri anual and anual canal mass;  Surgeon: Gaynelle Adu, MD;  Location: [facility] ORS;  Service: General;  Laterality: Left;    Social History   Socioeconomic History  . Marital status: Divorced    Spouse name: Not on file  . Number of children: 2  . Years of education: 110  . Highest education level: Not on file  Occupational History  . Occupation: Retired  Engineer, production  . Financial resource strain: Not on file  . Food insecurity:    Worry: Not on file    Inability: Not on file  . Transportation needs:    Medical: Not on file    Non-medical: Not on file  Tobacco Use  . Smoking status: Former Smoker    Packs/day: 0.50    Years: 20.00    Pack years: 10.00    Types: Cigarettes, Pipe, Cigars    Last attempt to quit: 01/09/1996    Years since quitting: 21.4  . Smokeless tobacco: Never Used  . Tobacco comment:    Substance and Sexual Activity  . Alcohol use: No    Alcohol/week: 0.0 oz    Frequency: Never    Comment: 01/12/2013 "did drink 2-3 12  oz beers a day; stopped drinking in 04/2012"  . Drug use: No  . Sexual activity: Never  Lifestyle  . Physical activity:    Days per week: Not on file    Minutes per session: Not on file  . Stress: Not on file  Relationships  . Social connections:    Talks on phone: Not on file    Gets together: Not on file    Attends religious service: Not on file    Active member of club or organization: Not on file    Attends meetings of clubs or organizations: Not on file    Relationship status: Not on file  . Intimate partner violence:    Fear of current or ex partner: Not on file    Emotionally abused: Not on file    Physically abused: Not on file    Forced sexual activity: Not on file  Other Topics Concern  . Not on file  Social History Narrative   Marital status: divorced; lives with ex-wife; not dating in 2017.      Children:  2 sons, 2 grandsons, 1  granddaughter; no gg.      Lives: with ex-wife in house.      Employment: retired first time age 68; retired in 1997.  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      Tobacco: quit smoking 1997; smoked for 27 years.      Alcohol: quit 2004      Exercise:  Walking daily short distances.       ADLs:  NO assistant devices; has cane if needs it; drives; pays bill; grocery shopping by wife; wife cleans house and does laundry.     Advanced Directives:  +LIVING WILL; +FULL CODE.  HCPOA: oldest son Adrean Molzahn Sr.)    Mobility: Independent but does require a cane for ambulating long distances Work history: Not obtained   Allergies  Allergen Reactions  . Dyazide [Hydrochlorothiazide W-Triamterene] Other (See Comments)    Caused hyponatremia 04/2012  . Lisinopril Other (See Comments)    cough    Family History  Problem Relation Age of Onset  . Heart disease Brother   . Throat cancer Brother   . Other Mother        UNSURE  . Other Father        UNSURE  . Emphysema Sister   . Emphysema Sister   . Other Brother        UNSURE  . Diabetes Unknown      Prior to Admission medications   Medication Sig Start Date End Date Taking? Authorizing Provider  apixaban (ELIQUIS) 2.5 MG TABS tablet Take 1 tablet (2.5 mg total) by mouth 2 (two) times daily. 06/05/17  Yes Leatha Gilding, MD  atorvastatin (LIPITOR) 40 MG tablet Take 1 tablet (40 mg total) by mouth daily at 6 PM. 06/05/17  Yes Gherghe, Daylene Katayama, MD  cholecalciferol (VITAMIN D) 1000 units tablet Take 1 tablet (1,000 Units total) by mouth daily. 10/15/16  Yes Plotnikov, Georgina Quint, MD  clidinium-chlordiazePOXIDE (LIBRAX) 5-2.5 MG capsule TAKE 1 CAPSULE BY MOUTH TWICE DAILY 04/01/17  Yes Plotnikov, Georgina Quint, MD  cyanocobalamin 1000 MCG tablet Take 1,000 mcg by mouth daily.    Yes [provider]  diltiazem (CARDIZEM CD) 120 MG 24 hr capsule Take 1 capsule (120 mg total) by mouth at bedtime. 03/11/17  Yes Plotnikov, Georgina Quint, MD  famotidine (PEPCID) 20 MG tablet One at bedtime Patient taking differently: Take  20 mg by mouth at bedtime.  05/27/17  Yes Nyoka Cowden, MD  fluticasone (FLONASE) 50 MCG/ACT nasal spray Place 2 sprays into both nostrils daily. Patient taking differently: Place 2 sprays into both nostrils at bedtime.  07/22/16  Yes Plotnikov, Georgina Quint, MD  gabapentin (NEURONTIN) 300 MG capsule Take 3 capsules at bedtime. Patient taking differently: Take 300 mg by mouth 3 (three) times daily.  07/22/16  Yes Plotnikov, Georgina Quint, MD  multivitamin-lutein (OCUVITE-LUTEIN) CAPS capsule Take 1 capsule by mouth daily.    Yes [provider]  pantoprazole (PROTONIX) 40 MG tablet Take 1 tablet (40 mg total) by mouth daily. Take 30-60 min before first meal of the day 05/27/17  Yes Wert, Charlaine Dalton, MD  polyethylene glycol powder (GLYCOLAX/MIRALAX) powder Take 17 g by mouth 2 (two) times daily as needed. 03/14/16  Yes Ethelda Chick, MD  senna-docusate (SENOKOT-S) 8.6-50 MG tablet Take 1 tablet by mouth daily.   Yes [provider]  vitamin E 100 UNIT capsule Take 100 Units by mouth daily.   Yes [provider]    Physical Exam: Vitals:   06/11/17 1630 06/11/17 1700 06/11/17 1715 06/11/17 1809  BP: 109/60 (!) 121/46  (!) 149/56  Pulse: (!) 33 (!) 33 (!) 39 (!) 36  Resp: (!) 21 16 17    Temp:    98.1 F (36.7 C)  TempSrc:    Oral  SpO2: 98% 96% 99% 100%  Weight:    86.7 kg (191 lb 1.6 oz)  Height:    6\' 1"  (1.854 m)      Constitutional: NAD, calm, comfortable, appears underweight Eyes: PERRL, lids and conjunctivae normal ENMT: Mucous membranes are moist. Posterior pharynx clear of any exudate or lesions. Neck: normal, supple, no masses, no thyromegaly Respiratory: clear to auscultation bilaterally, no wheezing, no crackles. Normal respiratory effort. No accessory muscle use.  Cardiovascular: Regular junctional rhythm bradycardic rate 35 to 38 bpm, no murmurs / rubs / gallops. No  extremity edema. 2+ pedal pulses. No carotid bruits.  Pacer pads have been applied to the patient. Abdomen: no tenderness, no masses palpated. No hepatosplenomegaly. Bowel sounds positive.  Musculoskeletal: no clubbing / cyanosis. No joint deformity upper and lower extremities. Good ROM, no contractures. Normal muscle tone.  Skin: no rashes, lesions, ulcers. No induration Neurologic: CN 2-12 grossly intact. Sensation intact, DTR normal. Strength 5/5 x all 4 extremities.  Psychiatric: Normal judgment and insight. Alert and oriented x 3. Normal mood.    Labs on Admission: I have personally reviewed following labs and imaging studies  CBC: Recent Labs  Lab 06/05/17 0628 06/11/17 1454 06/11/17 1517  WBC 6.6 11.6*  --   NEUTROABS  --  8.4*  --   HGB 11.2* 10.9* 11.2*  HCT 35.4* 34.8* 33.0*  MCV 87.0 88.5  --   PLT 210 210  --    Basic Metabolic Panel: Recent Labs  Lab 06/11/17 1454 06/11/17 1517  NA 131* 130*  K 4.6 4.5  CL 97* 96*  CO2 20*  --   GLUCOSE 99 104*  BUN 40* 38*  CREATININE 4.76* 4.70*  CALCIUM 8.8*  --    GFR: Estimated Creatinine Clearance: 13.2 mL/min (A) (by C-G formula based on SCr of 4.7 mg/dL (H)). Liver Function Tests: Recent Labs  Lab 06/11/17 1454  AST 26  ALT 15*  ALKPHOS 67  BILITOT 0.7  PROT 7.2  ALBUMIN 3.3*   No results for input(s): LIPASE, AMYLASE in the last 168 hours.  No results for input(s): AMMONIA in the last 168 hours. Coagulation Profile: No results for input(s): INR, PROTIME in the last 168 hours. Cardiac Enzymes: Recent Labs  Lab 06/11/17 1454  TROPONINI 0.03*   BNP (last 3 results) Recent Labs    04/24/17 1450  PROBNP 246.0*   HbA1C: No results for input(s): HGBA1C in the last 72 hours. CBG: No results for input(s): GLUCAP in the last 168 hours. Lipid Profile: No results for input(s): CHOL, HDL, LDLCALC, TRIG, CHOLHDL, LDLDIRECT in the last 72 hours. Thyroid Function Tests: No results for input(s): TSH,  T4TOTAL, FREET4, T3FREE, THYROIDAB in the last 72 hours. Anemia Panel: No results for input(s): VITAMINB12, FOLATE, FERRITIN, TIBC, IRON, RETICCTPCT in the last 72 hours. Urine analysis:    Component Value Date/Time   COLORURINE YELLOW 04/24/2016 0728   APPEARANCEUR CLEAR 04/24/2016 0728   LABSPEC 1.010 04/24/2016 0728   PHURINE 7.5 04/24/2016 0728   GLUCOSEU NEGATIVE 04/24/2016 0728   HGBUR NEGATIVE 04/24/2016 0728   BILIRUBINUR NEGATIVE 04/24/2016 0728   BILIRUBINUR negative 05/31/2015 0910   BILIRUBINUR Negative 04/28/2014 1516   KETONESUR NEGATIVE 04/24/2016 0728   PROTEINUR NEGATIVE 11/22/2015 0633   UROBILINOGEN 0.2 04/24/2016 0728   NITRITE NEGATIVE 04/24/2016 0728   LEUKOCYTESUR TRACE (A) 04/24/2016 0728   Sepsis Labs: @LABRCNTIP (procalcitonin:4,lacticidven:4) )No results found for this or any previous visit (from the past 240 hour(s)).   Radiological Exams on Admission: Dg Lumbar Spine 2-3 Views  Result Date: 06/11/2017 CLINICAL DATA:  Chronic back pain. EXAM: LUMBAR SPINE - 2-3 VIEW COMPARISON:  CT abdomen pelvis dated November 22, 2015. FINDINGS: Five lumbar type vertebral bodies. No acute fracture or subluxation. Vertebral body heights are preserved. Unchanged 8-9 mm anterolisthesis at L5-S1 due to bilateral L5 pars defects. Severe disc height loss at L5-S1, also unchanged. Remaining intervertebral disc heights are preserved. Aortic atherosclerosis. IMPRESSION: 1. Unchanged severe degenerative disc disease and grade 1 anterolisthesis at L5-S1 due to bilateral L5 pars defects. Electronically Signed   By: Obie Dredge M.D.   On: 06/11/2017 17:25   Dg Chest Portable 1 View  Result Date: 06/11/2017 CLINICAL DATA:  Chest pain and bradycardia EXAM: PORTABLE CHEST 1 VIEW COMPARISON:  Jun 03, 2017 FINDINGS: There is patchy infiltrate in the left base with small left pleural effusion. There is stable apical pleural thickening bilaterally. Lungs elsewhere are clear. Heart is mildly  enlarged with pulmonary vascularity normal. No adenopathy. There is aortic atherosclerosis. There is calcification in each carotid artery. No bony lesions evident. IMPRESSION: Patchy infiltrate left base with small left pleural effusion. Stable apical pleural thickening. Stable cardiomegaly. There is aortic atherosclerosis as well as extensive visualized carotid artery calcification bilaterally. Aortic Atherosclerosis (ICD10-I70.0). Electronically Signed   By: Bretta Bang III M.D.   On: 06/11/2017 15:39    EKG: (Independently reviewed) junctional rhythm with nonspecific IVCD vs LBBB, no ectopy, delayed R wave rotation, no acute ischemic changes  Assessment/Plan Principal Problem:   Acute kidney injury on Chronic kidney disease (CKD), stage IV (severe)  -Patient presented with bradycardia detected by physical therapist during pulse oximetry check; lab work revealed worsening kidney function with acute on chronic kidney disease -Cardiology suspects hypoperfusion from unknown duration bradycardia likely contributing to development of AKI -Patient admitted to triage nurse that he had taken at least 1 dose of his Diovan which had been discontinued during previous admission secondary to progressive renal disease-he told me he had stopped his medication-unclear actually how many doses of this medication the patient may  have taken and this certainly could contribute to worsening renal function as well -With underlying bradycardia with heart rates in the 30s blood pressure suboptimal so will begin NS at 100 cc/hr monitor I's/O closely and monitor for volume overload/heart failure symptoms -Patient is voiding and no clinical signs of urinary retention -Renal ultrasound -Follow labs  Active Problems:   Symptomatic bradycardia/Paroxysmal atrial flutter  -Patient with history of a flutter and tachybradycardia syndrome -Cardiology managing -Holding Cardizem and all AVN blocking agents -Continue  telemetry -Continue pacer pads -If bradycardia/rhythm does not improve after appropriate washout of calcium channel blocker patient may require pacemaker implantation -Eliquis discontinued in context of low GFR/AKI -Pharmacy managing IV heparin    Acute hyponatremia -Likely secondary to abnormal renal perfusion in the setting of acute kidney injury -Serum osmolality is normal -Urine osmolality and sodium pending -Do not suspect SIADH at this juncture -Was not on diuretics prior to admission    Hypertension -Current blood pressure readings suboptimal in the context of symptomatic bradycardia -Being preadmission Cardizem as above -Diovan had been discontinued during previous admission but it appears patient took at least 1 dose since discharge    History of CVA (cerebrovascular accident)-May 2019 -Heparin as above -Continue Lipitor -Patient reports resolution of prior stroke symptoms    Chronic diastolic heart failure  -Appears compensated -Monitor respiratory response to volume replacement as above -Cardiogram 06/04/2017: Mild concentric hypertrophy, LVEF 55 to 60%, grade 3 diastolic dysfunction, moderate to severe tricuspid regurgitation and moderate pulmonic regurgitation with mild to moderate pulmonary hypertension 45 mmHg; Rita reports that visually there appeared to be severe aortic stenosis but the gradients were more consistent with mild aortic stenosis    Back pain -Patient was stable, chronic severe degenerative disc disease and grade 1 anterolisthesis at L5-S1 -Lidoderm patch -K pad -Tylenol -Avoid narcotics for chronic pain in in patients with acute kidney injury -Unable to utilize NSAIDs secondary to CKD    COPD (chronic obstructive pulmonary disease) w/ bronchiectasis -Currently stable without wheezing -Was not on SABA or LABA prior to admission     HLD(hyperlipidemia)  -Continue statin  **Additional lab, imaging and/or diagnostic evaluation at discretion of  supervising physician  DVT prophylaxis: IV heparin Code Status: Full Family Communication: No family at bedside Disposition Plan: Home Consults called: Cardiology/[place]    Trysta Showman L. ANP-BC [facility] Pager [phone]   If 7PM-7AM, please contact night-coverage [email or web address] Password [facility]  06/11/2017, 6:15 PM

## 2017-06-11 NOTE — H&P (Deleted)
Cardiology Admission History and Physical:   Patient ID: Chad [name]; MRN: [id number]; DOB: May 20, 1931   Admission date: 06/11/2017  Primary Care Provider: Cassandria Anger, MD Primary Cardiologist: Chad Ruths, MD  Primary Electrophysiologist:    Chief Complaint:  bradycardia  Patient Profile:   Chad [name] is a 84 y.o. male with a history of atrial flutter (04/2012), normal coronaries by cath (2014), s/p atrial flutter ablation 07/08/12. He had an occurrence of atrial tachycardia 01/2013 and declined ablation at that time. Echo 05/2017 showed mild aortic stenosis. He also has a history of HTN, COPD, and recent stroke (05/2017).   History of Present Illness:   Chad [name]  is a 84 y.o. male with a history of atrial flutter (04/2012), normal coronaries by cath (2014), s/p atrial flutter ablation 07/08/12. He had an occurrence of atrial tachycardia 01/2013 and declined ablation at that time. He was treated with short acting cardizem but then showed evidence of tachy-brady syndrome. Holter monitor 09/2016 with rate-controlled atrial tachycardia.   Pt last saw Dr. Stanford [name] in clinic on 03/06/17. He was doing well at that time with no medication changes. He continued on low dose eliquis for atrial flutter history and cardizem 30 mg PRN for atrial tachycardia. HTN controlled with valsartan 80 mg.   He was recently seen in the ED with stroke-like symptoms 06/03/17. Imaging revealed acute right MCA territory infarct, nonhemorrhagic.  He was started on anticoagulation by primary team during that hospitalization for atrial fibrillation on EKG. Cardiology was not consulted. On review of his telemetry strips in Epic and EKG, his rhythm appears to be sinus brady with prolonged PR interval with junctional beats. Short-acting cardizem was changed to 120 mg long-acting. Admission EKG prior to cardizem showed a junctional rhythm with a heart rate in the 40s. He was started on low dose eliquis by primary team.  Notes in Epic described the patient as being confused about medications prior to discharge.   The home health nurse visited the patient in his home today and found his heart rate in the 30s. She also found that he had accidentally been taking double cardizem 120 mg. Valsartan had been D/C'ed. He presented to [facility] with bradycardia.  HR in the 30s. EKG with sinus bradycardia. Pressures are stable, it appears valsartan had been D/C'ed during his recent hospitalization. In review of his EKGs during his 05/2017, heart rate was 47, but was discharged with cardizem 120mg . Telemetry strip in "media" appears to be junctional.  He denies dizziness, SOB, chest pain, lower extremity swelling, and recent falls and syncope. He is tolerating the heart rate remarkably well.    Past Medical History:  Diagnosis Date  . Abnormal EKG    ST changes with normal coronaries by cath 05/06/12.  . Arthritis    "hands" (01/12/2013); feet (podiatry consultation)  . Atrial flutter ([place])    a. Slow ~100bpm when in 2:1; dx 04/2012. b. Placed on Xarelto.  c. s/p ablation 07-08-2012 by Dr Chad [name]  . Atrial tachycardia ([place])   . Carotid artery calcification    By CXR - dopplers 05/06/12 without obvious evidence of significant ICA stenosis >40%  . Colon polyps 06/26/2005  . COPD (chronic obstructive pulmonary disease) ([place])   . Diverticulosis of colon (without mention of hemorrhage)   . Dysrhythmia   . Gastritis, chronic    Pt denies bleeding 04/2012. EGD 2012 reportedly normal.  . GERD (gastroesophageal reflux disease)   . Hypertension   . Hyponatremia  04/2012 r/t diuretic.  . IBS (irritable bowel syndrome)   . Internal and external hemorrhoids without complication   . Macular degeneration of left eye   . 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 Pneumonia    "couple times" (01/12/2013)  . Shortness of breath dyspnea    walking distances    Past Surgical History:  Procedure Laterality Date  . ATRIAL FLUTTER ABLATION  07-08-2012   of  typical atrial flutter by Dr Chad [name]  . ATRIAL FLUTTER ABLATION N/A 07/08/2012   Procedure: ATRIAL FLUTTER ABLATION;  Surgeon: Chad Grayer, MD;  Location[facility] CATH LAB;  Service: Cardiovascular;  Laterality: N/A;  . CARDIAC CATHETERIZATION  04/2012  . COLONOSCOPY  08/29/10   diverticulosis, internal hemorrhoids  . ESOPHAGOGASTRODUODENOSCOPY  09/20/10   normal  . EYE SURGERY  01/08/2014   Cataract resection L.    [name] [place]  . LEFT HEART CATHETERIZATION WITH CORONARY ANGIOGRAM N/A 05/06/2012   Procedure: LEFT HEART CATHETERIZATION WITH CORONARY ANGIOGRAM;  Surgeon: Chad M Martinique, MD;  Location: [facility] CATH LAB;  Service: Cardiovascular;  Laterality: N/A;  . 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 TRIGGER FINGER RELEASE Right 1970's   "2 fingers"  . WART FULGURATION Left 09/15/2015   Procedure: EXCISIONal biospy of left peri anual and anual canal mass;  Surgeon: Chad Pickerel, MD;  Location: [facility] ORS;  Service: General;  Laterality: Left;     Medications Prior to Admission: Prior to Admission medications   Medication Sig Start Date End Date Taking? Authorizing Provider  apixaban (ELIQUIS) 2.5 MG TABS tablet Take 1 tablet (2.5 mg total) by mouth 2 (two) times daily. 06/05/17   Chad Griffins, MD  atorvastatin (LIPITOR) 40 MG tablet Take 1 tablet (40 mg total) by mouth daily at 6 PM. 06/05/17   Gherghe, Vella Redhead, MD  cholecalciferol (VITAMIN D) 1000 units tablet Take 1 tablet (1,000 Units total) by mouth daily. 10/15/16   Plotnikov, Evie Lacks, MD  clidinium-chlordiazePOXIDE (LIBRAX) 5-2.5 MG capsule TAKE 1 CAPSULE BY MOUTH TWICE DAILY 04/01/17   Plotnikov, Evie Lacks, MD  cyanocobalamin 1000 MCG tablet Take 1,000 mcg by mouth daily as needed (supplement).     [provider]  diltiazem (CARDIZEM CD) 120 MG 24 hr capsule Take 1 capsule (120 mg total) by mouth at bedtime. 03/11/17   Plotnikov, Evie Lacks, MD  famotidine (PEPCID) 20 MG tablet One at bedtime Patient taking  differently: Take 20 mg by mouth daily.  05/27/17   Chad Rockers, MD  fluticasone (FLONASE) 50 MCG/ACT nasal spray Place 2 sprays into both nostrils daily. 07/22/16   Plotnikov, Evie Lacks, MD  gabapentin (NEURONTIN) 300 MG capsule Take 3 capsules at bedtime. Patient taking differently: Take 300 mg by mouth 3 (three) times daily.  07/22/16   Plotnikov, Evie Lacks, MD  multivitamin-lutein (OCUVITE-LUTEIN) CAPS capsule Take 1 capsule by mouth daily.     [provider]  pantoprazole (PROTONIX) 40 MG tablet Take 1 tablet (40 mg total) by mouth daily. Take 30-60 min before first meal of the day 05/27/17   Chad Rockers, MD  polyethylene glycol powder (GLYCOLAX/MIRALAX) powder Take 17 g by mouth 2 (two) times daily as needed. 03/14/16   Wardell Honour, MD     Allergies:    Allergies  Allergen Reactions  . Dyazide [Hydrochlorothiazide W-Triamterene] Other (See Comments)    Caused hyponatremia 04/2012  . Lisinopril Other (See Comments)    cough    Social  History:   Social History   Socioeconomic History  . Marital status: Divorced    Spouse name: Not on file  . Number of children: 2  . Years of education: 62  . Highest education level: Not on file  Occupational History  . Occupation: Retired  Scientific laboratory technician  . Financial resource strain: Not on file  . Food insecurity:    Worry: Not on file    Inability: Not on file  . Transportation needs:    Medical: Not on file    Non-medical: Not on file  Tobacco Use  . Smoking status: Former Smoker    Packs/day: 0.50    Years: 20.00    Pack years: 10.00    Types: Cigarettes, Pipe, Cigars    Last attempt to quit: 01/09/1996    Years since quitting: 21.4  . Smokeless tobacco: Never Used  . Tobacco comment:    Substance and Sexual Activity  . Alcohol use: No    Alcohol/week: 0.0 oz    Frequency: Never    Comment: 01/12/2013 "did drink 2-3 12 oz beers a day; stopped drinking in 04/2012"  . Drug use: No  . Sexual activity: Never    Lifestyle  . Physical activity:    Days per week: Not on file    Minutes per session: Not on file  . Stress: Not on file  Relationships  . Social connections:    Talks on phone: Not on file    Gets together: Not on file    Attends religious service: Not on file    Active member of club or organization: Not on file    Attends meetings of clubs or organizations: Not on file    Relationship status: Not on file  . Intimate partner violence:    Fear of current or ex partner: Not on file    Emotionally abused: Not on file    Physically abused: Not on file    Forced sexual activity: Not on file  Other Topics Concern  . Not on file  Social History Narrative   Marital status: divorced; lives with ex-wife; not dating in 2017.      Children:  2 sons, 2 grandsons, 1 granddaughter; no gg.      Lives: with ex-wife in house.      Employment: retired first time age 51; retired in 1997.  Barista x 25 years; Actor.      Tobacco: quit smoking 1997; smoked for 27 years.      Alcohol: quit 2004      Exercise:  Walking daily short distances.       ADLs:  NO assistant devices; has cane if needs it; drives; pays bill; grocery shopping by wife; wife cleans house and does laundry.     Advanced Directives:  +LIVING WILL; +FULL CODE.  HCPOA: oldest son Zael Shuman Sr.)    Family History:  The patient's family history includes Diabetes in his unknown relative; Emphysema in his sister and sister; Heart disease in his brother; Other in his brother, father, and mother; Throat cancer in his brother.    ROS:  Please see the history of present illness.  All other ROS reviewed and negative.     Physical Exam/Data:   Vitals:   06/11/17 1457 06/11/17 1500 06/11/17 1530 06/11/17 1600  BP:  (!) 110/49 (!) 100/55 (!) 102/37  Pulse: (!) 35 (!) 34 (!) 53 (!) 33  Resp: 15 (!) 22 20 (!) 21  Temp:  TempSrc:      SpO2: 97% 98% 98% 100%  Weight:      Height:       No intake or  output data in the 24 hours ending 06/11/17 1622 Filed Weights   06/11/17 1439  Weight: 191 lb (86.6 kg)   Body mass index is 25.2 kg/m.  General:  Well nourished, well developed, in no acute distress HEENT: normal Neck: no JVD Vascular: No carotid bruits Cardiac:  normal S1, S2; RRR; 3/6 systoic murmur Lungs:  clear to auscultation bilaterally, no wheezing, rhonchi or rales  Abd: soft, nontender, no hepatomegaly  Ext: no edema Musculoskeletal:  No deformities, BUE and BLE strength normal and equal Skin: warm and dry  Neuro:  CNs 2-12 intact, no focal abnormalities noted Psych:  Normal affect    EKG:  The ECG that was done was personally reviewed and demonstrates sinus bradycardia with junctional beats  Relevant CV Studies:  Echo 06/04/17: Study Conclusions - Left ventricle: The cavity size was normal. There was mild   concentric hypertrophy. Systolic function was normal. The   estimated ejection fraction was in the range of 55% to 60%.   Hypokinesis of the basal-midanteroseptal and inferoseptal   myocardium. Doppler parameters are consistent with a reversible   restrictive pattern, indicative of decreased left ventricular   diastolic compliance and/or increased left atrial pressure (grade   3 diastolic dysfunction). - Aortic valve: Valve mobility was restricted. Transvalvular   velocity was within the normal range. There was no stenosis.   There was no regurgitation. Valve area (VTI): 2.75 cm^2. Valve   area (Vmax): 3.08 cm^2. Valve area (Vmean): 3.6 cm^2. - Mitral valve: Calcified annulus. Transvalvular velocity was   within the normal range. There was no evidence for stenosis.   There was mild regurgitation. - Left atrium: The atrium was mildly dilated. - Right ventricle: Systolic function was normal. - Tricuspid valve: There was moderate-severe regurgitation. - Pulmonic valve: There was moderate regurgitation. - Pulmonary arteries: Systolic pressure was mildly  increased. PA   peak pressure: 45 mm Hg (S).  Impressions: - Visually, there appears to be severe aortic stenosis. However,   gradients across the aortic valve are consistent with mild aortic   stenosis. Consider repeating limited echo and looking at right   parasternal views, TEE or cath if clinically indicated.   Laboratory Data:  Chemistry Recent Labs  Lab 06/11/17 1517  NA 130*  K 4.5  CL 96*  GLUCOSE 104*  BUN 38*  CREATININE 4.70*    No results for input(s): PROT, ALBUMIN, AST, ALT, ALKPHOS, BILITOT in the last 168 hours. Hematology Recent Labs  Lab 06/05/17 0628 06/11/17 1454 06/11/17 1517  WBC 6.6 11.6*  --   RBC 4.07* 3.93*  --   HGB 11.2* 10.9* 11.2*  HCT 35.4* 34.8* 33.0*  MCV 87.0 88.5  --   MCH 27.5 27.7  --   MCHC 31.6 31.3  --   RDW 14.8 15.0  --   PLT 210 210  --    Cardiac EnzymesNo results for input(s): TROPONINI in the last 168 hours. No results for input(s): TROPIPOC in the last 168 hours.  BNPNo results for input(s): BNP, PROBNP in the last 168 hours.  DDimer No results for input(s): DDIMER in the last 168 hours.  Radiology/Studies:  Dg Chest Portable 1 View  Result Date: 06/11/2017 CLINICAL DATA:  Chest pain and bradycardia EXAM: PORTABLE CHEST 1 VIEW COMPARISON:  Jun 03, 2017 FINDINGS: There is patchy  infiltrate in the left base with small left pleural effusion. There is stable apical pleural thickening bilaterally. Lungs elsewhere are clear. Heart is mildly enlarged with pulmonary vascularity normal. No adenopathy. There is aortic atherosclerosis. There is calcification in each carotid artery. No bony lesions evident. IMPRESSION: Patchy infiltrate left base with small left pleural effusion. Stable apical pleural thickening. Stable cardiomegaly. There is aortic atherosclerosis as well as extensive visualized carotid artery calcification bilaterally. Aortic Atherosclerosis (ICD10-I70.0). Electronically Signed   By: Lowella Grip III M.D.   On:  06/11/2017 15:39    Assessment and Plan:   1. Medication-mediated bradycardia - recent discharge 06/04/17 after stroke with the following medication changes: 30 mg cardizem PRN was discontinued, cardizem 120 mg CD started, eliquis 2.5 mgBID started, valsartan D/C'ed - per the home health nurse, he had been taking cardizem twice each day in stead of once daily - he has not had any bleeding issues on the low dose eliquis   2. Mild aortic stenosis - murmur on exam - stable, no shortness of breath   3. HTN - pressures well-controlled - no anti-hypertensives at this time   4. Back pain - pt describes lower back pain that started yesterday - will order lumbar xray   Plan to admit to cardiology stepdown unit for continuous telemetry monitoring. Avoid all AV nodal blocking agents. Will let effects of cardizem dissipate over the next 24-48 hrs. Underlying rhythm will guide next steps in his medication selection. Continue eliquis.   This patients CHA2DS2-VASc Score and unadjusted Ischemic Stroke Rate (% per year) is equal to 9.7 % stroke rate/year from a score of 6 (HTN, stroke, age, plaque). May consider repeat event monitor in the future to assess Afib burden and rate control.     Severity of Illness: The appropriate patient status for this patient is INPATIENT. Inpatient status is judged to be reasonable and necessary in order to provide the required intensity of service to ensure the patient's safety. The patient's presenting symptoms, physical exam findings, and initial radiographic and laboratory data in the context of their chronic comorbidities is felt to place them at high risk for further clinical deterioration. Furthermore, it is not anticipated that the patient will be medically stable for discharge from the hospital within 2 midnights of admission. The following factors support the patient status of inpatient.   " The patient's presenting symptoms include profound  bradycardia. " The worrisome physical exam findings include bradycardia. " The initial radiographic and laboratory data are worrisome because of bradycardia. " The chronic co-morbidities include atrial flutter, stroke.   * I certify that at the point of admission it is my clinical judgment that the patient will require inpatient hospital care spanning beyond 2 midnights from the point of admission due to high intensity of service, high risk for further deterioration and high frequency of surveillance required.*    For questions or updates, please contact [place] Please consult [email or web address] for contact info under Cardiology/STEMI.    Signed, [place], PA  06/11/2017 4:22 PM

## 2017-06-11 NOTE — ED Notes (Signed)
Pt placed on zoll pads 

## 2017-06-11 NOTE — ED Provider Notes (Signed)
[facility] EMERGENCY DEPARTMENT Provider Note   CSN: [id number] Arrival date & time: 06/11/17  1425     History   Chief Complaint Chief Complaint  Patient presents with  . Chest Pain    HPI Chad [name] is a 84 y.o. male.  HPI Patient presents with bradycardia.  Had recent stroke while in the hospital.  History of atrial flutter.  On Eliquis.  Discharged around 5 days ago.  States that since discharge she has accidentally been taking an extra dose of his atorvastatin and Cardizem.  Has been taking twice a day instead of once a day.  Heart rate in the 30s.  No chest pain.  No shortness of breath.  States he feels fine.  Has had some mild fatigue. Past Medical History:  Diagnosis Date  . Abnormal EKG    ST changes with normal coronaries by cath 05/06/12.  . Arthritis    "hands" (01/12/2013); feet (podiatry consultation)  . Atrial flutter ([place])    a. Slow ~100bpm when in 2:1; dx 04/2012. b. Placed on Xarelto.  c. s/p ablation 07-08-2012 by Dr Rayann Heman  . Atrial tachycardia ([place])   . Carotid artery calcification    By CXR - dopplers 05/06/12 without obvious evidence of significant ICA stenosis >40%  . Colon polyps 06/26/2005  . COPD (chronic obstructive pulmonary disease) ([place])   . Diverticulosis of colon (without mention of hemorrhage)   . Dysrhythmia   . Gastritis, chronic    Pt denies bleeding 04/2012. EGD 2012 reportedly normal.  . GERD (gastroesophageal reflux disease)   . Hypertension   . Hyponatremia    04/2012 r/t diuretic.  . IBS (irritable bowel syndrome)   . Internal and external hemorrhoids without complication   . Macular degeneration of left eye   . 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 Pneumonia    "couple times" (01/12/2013)  . Shortness of breath dyspnea    walking distances    Patient Active Problem List   Diagnosis Date Noted  . Cerebral embolism with cerebral infarction 06/04/2017  . CVA (cerebral vascular accident) ([place]) 06/04/2017  .  Stroke-like symptoms 06/03/2017  . Bronchiectasis without complication ([place]) 51/76/1607  . DOE (dyspnea on exertion) 04/24/2017  . Cough 02/13/2017  . Flatulence 02/13/2017  . Hemoptysis 06/12/2016  . Creatinine elevation 06/12/2016  . Eyebrow laceration, right, sequela 05/18/2016  . Abnormal TSH 05/18/2016  . Well adult exam 04/20/2016  . Enlarged prostate 04/20/2016  . Community acquired pneumonia   . Pneumonia of both lungs due to infectious organism 03/02/2016  . 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 Anal lesion 06/07/2015  . Gas 01/18/2015  . Aortic stenosis 06/08/2013  . Unsteadiness 02/11/2013  . Ectopic atrial tachycardia ([place]) 01/12/2013  . Atrial tachycardia ([place]) 01/12/2013  . Atrial flutter ([place]) 05/06/2012  . Aortic valve sclerosis 05/06/2012  . Acute renal failure ([place]) 05/05/2012  . 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 Anemia 07/27/2010  . Essential hypertension 11/15/2008  . COPD 11/15/2008  . GERD 11/15/2008  . IBS 11/15/2008  . Midsternal chest pain 11/15/2008    Past Surgical History:  Procedure Laterality Date  . ATRIAL FLUTTER ABLATION  07-08-2012   of typical atrial flutter by Dr Rayann Heman  . ATRIAL FLUTTER ABLATION N/A 07/08/2012   Procedure: ATRIAL FLUTTER ABLATION;  Surgeon: Thompson Grayer, MD;  Location: [facility] CATH LAB;  Service: Cardiovascular;  Laterality: N/A;  . CARDIAC CATHETERIZATION  04/2012  .  COLONOSCOPY  08/29/10   diverticulosis, internal hemorrhoids  . ESOPHAGOGASTRODUODENOSCOPY  09/20/10   normal  . EYE SURGERY  01/08/2014   Cataract resection L.    [name]  . LEFT HEART CATHETERIZATION WITH CORONARY ANGIOGRAM N/A 05/06/2012   Procedure: LEFT HEART CATHETERIZATION WITH CORONARY ANGIOGRAM;  Surgeon: Peter M Martinique, MD;  Location: [facility] CATH LAB;  Service: Cardiovascular;  Laterality: N/A;  . 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 TRIGGER  FINGER RELEASE Right 1970's   "2 fingers"  . WART FULGURATION Left 09/15/2015   Procedure: EXCISIONal biospy of left peri anual and anual canal mass;  Surgeon: Greer Pickerel, MD;  Location: [facility] ORS;  Service: General;  Laterality: Left;        Home Medications    Prior to Admission medications   Medication Sig Start Date End Date Taking? Authorizing Provider  apixaban (ELIQUIS) 2.5 MG TABS tablet Take 1 tablet (2.5 mg total) by mouth 2 (two) times daily. 06/05/17   Caren Griffins, MD  atorvastatin (LIPITOR) 40 MG tablet Take 1 tablet (40 mg total) by mouth daily at 6 PM. 06/05/17   Gherghe, Vella Redhead, MD  cholecalciferol (VITAMIN D) 1000 units tablet Take 1 tablet (1,000 Units total) by mouth daily. 10/15/16   Plotnikov, Evie Lacks, MD  clidinium-chlordiazePOXIDE (LIBRAX) 5-2.5 MG capsule TAKE 1 CAPSULE BY MOUTH TWICE DAILY 04/01/17   Plotnikov, Evie Lacks, MD  cyanocobalamin 1000 MCG tablet Take 1,000 mcg by mouth daily as needed (supplement).     [provider]  diltiazem (CARDIZEM CD) 120 MG 24 hr capsule Take 1 capsule (120 mg total) by mouth at bedtime. 03/11/17   Plotnikov, Evie Lacks, MD  famotidine (PEPCID) 20 MG tablet One at bedtime Patient taking differently: Take 20 mg by mouth daily.  05/27/17   Tanda Rockers, MD  fluticasone (FLONASE) 50 MCG/ACT nasal spray Place 2 sprays into both nostrils daily. 07/22/16   Plotnikov, Evie Lacks, MD  gabapentin (NEURONTIN) 300 MG capsule Take 3 capsules at bedtime. Patient taking differently: Take 300 mg by mouth 3 (three) times daily.  07/22/16   Plotnikov, Evie Lacks, MD  multivitamin-lutein (OCUVITE-LUTEIN) CAPS capsule Take 1 capsule by mouth daily.     [provider]  pantoprazole (PROTONIX) 40 MG tablet Take 1 tablet (40 mg total) by mouth daily. Take 30-60 min before first meal of the day 05/27/17   Tanda Rockers, MD  polyethylene glycol powder (GLYCOLAX/MIRALAX) powder Take 17 g by mouth 2 (two) times daily as needed. 03/14/16    Wardell Honour, MD    Family History Family History  Problem Relation Age of Onset  . Heart disease Brother   . Throat cancer Brother   . Other Mother        UNSURE  . Other Father        UNSURE  . Emphysema Sister   . Emphysema Sister   . Other Brother        UNSURE  . Diabetes Unknown     Social History Social History   Tobacco Use  . Smoking status: Former Smoker    Packs/day: 0.50    Years: 20.00    Pack years: 10.00    Types: Cigarettes, Pipe, Cigars    Last attempt to quit: 01/09/1996    Years since quitting: 21.4  . Smokeless tobacco: Never Used  . Tobacco comment:    Substance Use Topics  . Alcohol use: No  Alcohol/week: 0.0 oz    Frequency: Never    Comment: 01/12/2013 "did drink 2-3 12 oz beers a day; stopped drinking in 04/2012"  . Drug use: No     Allergies   Dyazide [hydrochlorothiazide w-triamterene] and Lisinopril   Review of Systems Review of Systems  Constitutional: Negative for appetite change.  HENT: Negative for congestion.   Respiratory: Negative for shortness of breath.   Cardiovascular: Negative for chest pain.  Gastrointestinal: Negative for abdominal pain.  Genitourinary: Negative for flank pain.  Musculoskeletal: Negative for back pain.  Skin: Negative for rash.  Neurological: Positive for weakness. Negative for numbness.  Hematological: Negative for adenopathy.  Psychiatric/Behavioral: Negative for confusion.     Physical Exam Updated Vital Signs BP (!) 102/37   Pulse (!) 33   Temp 98.1 F (36.7 C) (Oral)   Resp (!) 21   Ht 6\' 1"  (1.854 m)   Wt 86.6 kg (191 lb)   SpO2 100%   BMI 25.20 kg/m   Physical Exam  Constitutional: He appears well-developed.  HENT:  Head: Normocephalic.  Cardiovascular:  Irregular bradycardia  Pulmonary/Chest: Effort normal and breath sounds normal.  Abdominal: There is no tenderness.  Musculoskeletal:       Right lower leg: He exhibits no edema.       Left lower leg: He exhibits no  edema.  Neurological: He is alert.  Skin: Skin is warm. Capillary refill takes less than 2 seconds.  Psychiatric: He has a normal mood and affect.     ED Treatments / Results  Labs (all labs ordered are listed, but only abnormal results are displayed) Labs Reviewed  CBC WITH DIFFERENTIAL/PLATELET - Abnormal; Notable for the following components:      Result Value   WBC 11.6 (*)    RBC 3.93 (*)    Hemoglobin 10.9 (*)    HCT 34.8 (*)    Neutro Abs 8.4 (*)    Monocytes Absolute 1.2 (*)    All other components within normal limits  I-STAT CHEM 8, ED - Abnormal; Notable for the following components:   Sodium 130 (*)    Chloride 96 (*)    BUN 38 (*)    Creatinine, Ser 4.70 (*)    Glucose, Bld 104 (*)    Calcium, Ion 1.14 (*)    Hemoglobin 11.2 (*)    HCT 33.0 (*)    All other components within normal limits  COMPREHENSIVE METABOLIC PANEL  TROPONIN I    EKG EKG Interpretation  Date/Time:  Tuesday June 11 2017 14:33:32 EDT Ventricular Rate:  39 PR Interval:    QRS Duration: 152 QT Interval:  524 QTC Calculation: 421 R Axis:   -53 Text Interpretation: Critical Test Result: Arrhythmia Idioventricular rhythm vs afib with occasional Premature ventricular complexes Left axis deviation Left bundle branch block Abnormal ECG Confirmed by Davonna Belling [phone]) on 06/11/2017 2:42:49 PM   Radiology Dg Chest Portable 1 View  Result Date: 06/11/2017 CLINICAL DATA:  Chest pain and bradycardia EXAM: PORTABLE CHEST 1 VIEW COMPARISON:  Jun 03, 2017 FINDINGS: There is patchy infiltrate in the left base with small left pleural effusion. There is stable apical pleural thickening bilaterally. Lungs elsewhere are clear. Heart is mildly enlarged with pulmonary vascularity normal. No adenopathy. There is aortic atherosclerosis. There is calcification in each carotid artery. No bony lesions evident. IMPRESSION: Patchy infiltrate left base with small left pleural effusion. Stable apical pleural  thickening. Stable cardiomegaly. There is aortic atherosclerosis as well as extensive visualized  carotid artery calcification bilaterally. Aortic Atherosclerosis (ICD10-I70.0). Electronically Signed   By: Lowella Grip III M.D.   On: 06/11/2017 15:39    Procedures Procedures (including critical care time)  Medications Ordered in ED Medications - No data to display   Initial Impression / Assessment and Plan / ED Course  I have reviewed the triage vital signs and the nursing notes.  Pertinent labs & imaging results that were available during my care of the patient were reviewed by me and considered in my medical decision making (see chart for details).     Patient brought in for bradycardia.  Has been accidentally taking double dose of his Cardizem.  Has been doing this since discharge.  Has what appears to be in atrial fibrillation but his rate is in the 30s.  He states he does not feel particularly bad with it however. He does have a acute kidney injury also with creatinine up to almost 5 now. Has had a cough for the last months.  Pretty much unchanged.  However x-ray does show mildly worsening infiltrate in left base.  Will allow primary pulmonology to decide if this needs treatment or is just some of his chronic bronchiectasis.  Will admit to hospitalist.  Cardiology has been consulted.  CRITICAL CARE Performed by: Davonna Belling Total critical care time: 30 minutes Critical care time was exclusive of separately billable procedures and treating other patients. Critical care was necessary to treat or prevent imminent or life-threatening deterioration. Critical care was time spent personally by me on the following activities: development of treatment plan with patient and/or surrogate as well as nursing, discussions with consultants, evaluation of patient's response to treatment, examination of patient, obtaining history from patient or surrogate, ordering and performing treatments  and interventions, ordering and review of laboratory studies, ordering and review of radiographic studies, pulse oximetry and re-evaluation of patient's condition.  Final Clinical Impressions(s) / ED Diagnoses   Final diagnoses:  Bradycardia  AKI (acute kidney injury) ([place])  Calcium channel blocker overdose, accidental or unintentional, initial encounter    ED Discharge Orders    None       Davonna Belling, MD 06/11/17 1609

## 2017-06-11 NOTE — ED Notes (Signed)
Pt reports he has been taking double doses of his diltiazem and valsartan twice a day for the past 2-3 days since being d.c from the hospital.

## 2017-06-11 NOTE — ED Notes (Signed)
Attempted report X1

## 2017-06-11 NOTE — ED Triage Notes (Signed)
Patient present to the ED with complaints of chest pain and Loletha Grayer. Patient reports Accidentally took double diltiazem and Valsartan that was D/C. Patient alert and oriented. Denies any Shortness of breath or weakness.

## 2017-06-11 NOTE — Telephone Encounter (Signed)
Tillie Rung PT from [facility] called to report pt's HR ranging from 37 to 50 bpm. Tillie Rung stated that pt accidentally took a double dose of Valsartan (which per Tillie Rung had been discontinued.) He also took a double dose of diltiazem. BP 100/60. No there sx noted by Tillie Rung and pt denies any sx. Due to his HR being so low, advised Tillie Rung that pt needs to be evaluated at the ED. Tillie Rung stated understanding.    Reason for Disposition . Heart beating very slowly (e.g., < 50 / minute)  (Exception: athlete)  Answer Assessment - Initial Assessment Questions 1. DESCRIPTION: "Please describe your heart rate or heart beat that you are having" (e.g., fast/slow, regular/irregular, skipped or extra beats, "palpitations")    HR 30-50 2. ONSET: "When did it start?" (Minutes, hours or days)      unknown 3. DURATION: "How long does it last" (e.g., seconds, minutes, hours)     Unsure  4. PATTERN "Does it come and go, or has it been constant since it started?"  "Does it get worse with exertion?"   "Are you feeling it now?"     Not known 5. TAP: "Using your hand, can you tap out what you are feeling on a chair or table in front of you, so that I can hear?" (Note: not all patients can do this)       Pt is unable  6. HEART RATE: "Can you tell me your heart rate?" "How many beats in 15 seconds?"  (Note: not all patients can do this)       Per Kendra 37 bpm to 50 bpm. 7. RECURRENT SYMPTOM: "Have you ever had this before?" If so, ask: "When was the last time?" and "What happened that time?"      Did not ask  8. CAUSE: "What do you think is causing the palpitations?"     No palpitations 9. CARDIAC HISTORY: "Do you have any history of heart disease?" (e.g., heart attack, angina, bypass surgery, angioplasty, arrhythmia)      Atrial tachycardia 10. OTHER SYMPTOMS: "Do you have any other symptoms?" (e.g., dizziness, chest pain, sweating, difficulty breathing)       none 11. PREGNANCY: "Is there any chance you are pregnant?"  "When was your last menstrual period?"       n/a  Protocols used: HEART RATE AND HEARTBEAT QUESTIONS-A-AH

## 2017-06-11 NOTE — Consult Note (Addendum)
Cardiology Admission History and Physical:   Patient ID: Chad [name]; MRN: [id number]; DOB: 01-11-1931   Admission date: 06/11/2017  Primary Care Provider: Cassandria Anger, MD Primary Cardiologist: Kirk Ruths, MD  Primary Electrophysiologist:    Chief Complaint:  bradycardia  Patient Profile:   [name] is a 84 y.o. male with a history of atrial flutter (04/2012), normal coronaries by cath (2014), s/p atrial flutter ablation 07/08/12. He had an occurrence of atrial tachycardia 01/2013 and declined ablation at that time. Echo 05/2017 showed mild aortic stenosis. He also has a history of HTN, COPD, and recent stroke (05/2017).   History of Present Illness:   Chad [name]  is a 84 y.o. male with a history of atrial flutter (04/2012), normal coronaries by cath (2014), s/p atrial flutter ablation 07/08/12. He had an occurrence of atrial tachycardia 01/2013 and declined ablation at that time. He was treated with short acting cardizem but then showed evidence of tachy-brady syndrome. Holter monitor 09/2016 with rate-controlled atrial tachycardia.   Pt last saw Dr. Stanford Breed in clinic on 03/06/17. He was doing well at that time with no medication changes. He continued on low dose eliquis for atrial flutter history and cardizem 30 mg PRN for atrial tachycardia. HTN controlled with valsartan 80 mg.   He was recently seen in the ED with stroke-like symptoms 06/03/17. Imaging revealed acute right MCA territory infarct, nonhemorrhagic.  He was started on anticoagulation by primary team during that hospitalization for atrial fibrillation on EKG. Cardiology was not consulted. On review of his telemetry strips in Epic and EKG, his rhythm appears to be sinus brady with prolonged PR interval with junctional beats. Short-acting cardizem was changed to 120 mg long-acting. Admission EKG prior to cardizem showed a junctional rhythm with a heart rate in the 40s. He was started on low dose eliquis by primary team.  Notes in Epic described the patient as being confused about medications prior to discharge.   The home health nurse visited the patient in his home today and found his heart rate in the 30s. She also found that he had accidentally been taking double cardizem 120 mg. Valsartan had been D/C'ed. He presented to [facility] with bradycardia.  HR in the 30s. EKG with sinus bradycardia. Pressures are stable, it appears valsartan had been D/C'ed during his recent hospitalization. In review of his EKGs during his 05/2017, heart rate was 47, but was discharged with cardizem 120mg . Telemetry strip in "media" appears to be junctional.  He denies dizziness, SOB, chest pain, lower extremity swelling, and recent falls and syncope. He is tolerating the heart rate remarkably well.    Past Medical History:  Diagnosis Date  . Abnormal EKG    ST changes with normal coronaries by cath 05/06/12.  . Arthritis    "hands" (01/12/2013); feet (podiatry consultation)  . Atrial flutter ([place])    a. Slow ~100bpm when in 2:1; dx 04/2012. b. Placed on Xarelto.  c. s/p ablation 07-08-2012 by Dr Rayann Heman  . Atrial tachycardia ([place])   . Carotid artery calcification    By CXR - dopplers 05/06/12 without obvious evidence of significant ICA stenosis >40%  . Colon polyps 06/26/2005  . COPD (chronic obstructive pulmonary disease) ([place])   . Diverticulosis of colon (without mention of hemorrhage)   . Dysrhythmia   . Gastritis, chronic    Pt denies bleeding 04/2012. EGD 2012 reportedly normal.  . GERD (gastroesophageal reflux disease)   . Hypertension   . Hyponatremia  04/2012 r/t diuretic.  . IBS (irritable bowel syndrome)   . Internal and external hemorrhoids without complication   . Macular degeneration of left eye   . Oral cancer ([place]) 02/1997  . Pneumonia    "couple times" (01/12/2013)  . Shortness of breath dyspnea    walking distances    Past Surgical History:  Procedure Laterality Date  . ATRIAL FLUTTER ABLATION  07-08-2012   of  typical atrial flutter by Dr Rayann Heman  . ATRIAL FLUTTER ABLATION N/A 07/08/2012   Procedure: ATRIAL FLUTTER ABLATION;  Surgeon: Thompson Grayer, MD;  Location: [facility] C[facility] LAB;  Service: Cardiovascular;  Laterality: N/A;  . CARDIAC CATHETERIZATION  04/2012  . COLONOSCOPY  08/29/10   diverticulosis, internal hemorrhoids  . ESOPHAGOGASTRODUODENOSCOPY  09/20/10   normal  . EYE SURGERY  01/08/2014   Cataract resection L.    Chad [name]  . LEFT HEART CATHETERIZATION WITH CORONARY ANGIOGRAM N/A 05/06/2012   Procedure: LEFT HEART CATHETERIZATION WITH CORONARY ANGIOGRAM;  Surgeon: Peter M Martinique, MD;  Location: [facility] CATH LAB;  Service: Cardiovascular;  Laterality: N/A;  . 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 TRIGGER FINGER RELEASE Right 1970's   "2 fingers"  . WART FULGURATION Left 09/15/2015   Procedure: EXCISIONal biospy of left peri anual and anual canal mass;  Surgeon: Greer Pickerel, MD;  Location: [facility] ORS;  Service: General;  Laterality: Left;     Medications Prior to Admission: Prior to Admission medications   Medication Sig Start Date End Date Taking? Authorizing Provider  apixaban (ELIQUIS) 2.5 MG TABS tablet Take 1 tablet (2.5 mg total) by mouth 2 (two) times daily. 06/05/17   Caren Griffins, MD  atorvastatin (LIPITOR) 40 MG tablet Take 1 tablet (40 mg total) by mouth daily at 6 PM. 06/05/17   Gherghe, Vella Redhead, MD  cholecalciferol (VITAMIN D) 1000 units tablet Take 1 tablet (1,000 Units total) by mouth daily. 10/15/16   Plotnikov, Evie Lacks, MD  clidinium-chlordiazePOXIDE (LIBRAX) 5-2.5 MG capsule TAKE 1 CAPSULE BY MOUTH TWICE DAILY 04/01/17   Plotnikov, Evie Lacks, MD  cyanocobalamin 1000 MCG tablet Take 1,000 mcg by mouth daily as needed (supplement).     [provider]  diltiazem (CARDIZEM CD) 120 MG 24 hr capsule Take 1 capsule (120 mg total) by mouth at bedtime. 03/11/17   Plotnikov, Evie Lacks, MD  famotidine (PEPCID) 20 MG tablet One at bedtime Patient taking  differently: Take 20 mg by mouth daily.  05/27/17   Tanda Rockers, MD  fluticasone (FLONASE) 50 MCG/ACT nasal spray Place 2 sprays into both nostrils daily. 07/22/16   Plotnikov, Evie Lacks, MD  gabapentin (NEURONTIN) 300 MG capsule Take 3 capsules at bedtime. Patient taking differently: Take 300 mg by mouth 3 (three) times daily.  07/22/16   Plotnikov, Evie Lacks, MD  multivitamin-lutein (OCUVITE-LUTEIN) CAPS capsule Take 1 capsule by mouth daily.     [provider]  pantoprazole (PROTONIX) 40 MG tablet Take 1 tablet (40 mg total) by mouth daily. Take 30-60 min before first meal of the day 05/27/17   Tanda Rockers, MD  polyethylene glycol powder (GLYCOLAX/MIRALAX) powder Take 17 g by mouth 2 (two) times daily as needed. 03/14/16   Wardell Honour, MD     Allergies:    Allergies  Allergen Reactions  . Dyazide [Hydrochlorothiazide W-Triamterene] Other (See Comments)    Caused hyponatremia 04/2012  . Lisinopril Other (See Comments)    cough    Social  History:   Social History   Socioeconomic History  . Marital status: Divorced    Spouse name: Not on file  . Number of children: 2  . Years of education: 58  . Highest education level: Not on file  Occupational History  . Occupation: Retired  Scientific laboratory technician  . Financial resource strain: Not on file  . Food insecurity:    Worry: Not on file    Inability: Not on file  . Transportation needs:    Medical: Not on file    Non-medical: Not on file  Tobacco Use  . Smoking status: Former Smoker    Packs/day: 0.50    Years: 20.00    Pack years: 10.00    Types: Cigarettes, Pipe, Cigars    Last attempt to quit: 01/09/1996    Years since quitting: 21.4  . Smokeless tobacco: Never Used  . Tobacco comment:    Substance and Sexual Activity  . Alcohol use: No    Alcohol/week: 0.0 oz    Frequency: Never    Comment: 01/12/2013 "did drink 2-3 12 oz beers a day; stopped drinking in 04/2012"  . Drug use: No  . Sexual activity: Never    Lifestyle  . Physical activity:    Days per week: Not on file    Minutes per session: Not on file  . Stress: Not on file  Relationships  . Social connections:    Talks on phone: Not on file    Gets together: Not on file    Attends religious service: Not on file    Active member of club or organization: Not on file    Attends meetings of clubs or organizations: Not on file    Relationship status: Not on file  . Intimate partner violence:    Fear of current or ex partner: Not on file    Emotionally abused: Not on file    Physically abused: Not on file    Forced sexual activity: Not on file  Other Topics Concern  . Not on file  Social History Narrative   Marital status: divorced; lives with ex-wife; not dating in 2017.      Children:  2 sons, 2 grandsons, 1 granddaughter; no gg.      Lives: with ex-wife in house.      Employment: retired first time age 59; retired in 1997.  Barista x 25 years; Actor.      Tobacco: quit smoking 1997; smoked for 27 years.      Alcohol: quit 2004      Exercise:  Walking daily short distances.       ADLs:  NO assistant devices; has cane if needs it; drives; pays bill; grocery shopping by wife; wife cleans house and does laundry.     Advanced Directives:  +LIVING WILL; +FULL CODE.  HCPOA: oldest son Mattis Featherly Sr.)    Family History:  The patient's family history includes Diabetes in his unknown relative; Emphysema in his sister and sister; Heart disease in his brother; Other in his brother, father, and mother; Throat cancer in his brother.    ROS:  Please see the history of present illness.  All other ROS reviewed and negative.     Physical Exam/Data:   Vitals:   06/11/17 1457 06/11/17 1500 06/11/17 1530 06/11/17 1600  BP:  (!) 110/49 (!) 100/55 (!) 102/37  Pulse: (!) 35 (!) 34 (!) 53 (!) 33  Resp: 15 (!) 22 20 (!) 21  Temp:  TempSrc:      SpO2: 97% 98% 98% 100%  Weight:      Height:       No intake or  output data in the 24 hours ending 06/11/17 1622 Filed Weights   06/11/17 1439  Weight: 191 lb (86.6 kg)   Body mass index is 25.2 kg/m.  General:  Well nourished, well developed, in no acute distress HEENT: normal Neck: no JVD Vascular: No carotid bruits Cardiac:  normal S1, S2; RRR; 3/6 systoic murmur Lungs:  clear to auscultation bilaterally, no wheezing, rhonchi or rales  Abd: soft, nontender, no hepatomegaly  Ext: no edema Musculoskeletal:  No deformities, BUE and BLE strength normal and equal Skin: warm and dry  Neuro:  CNs 2-12 intact, no focal abnormalities noted Psych:  Normal affect    EKG:  The ECG that was done was personally reviewed and demonstrates sinus bradycardia with junctional beats  Relevant CV Studies:  Echo 06/04/17: Study Conclusions - Left ventricle: The cavity size was normal. There was mild   concentric hypertrophy. Systolic function was normal. The   estimated ejection fraction was in the range of 55% to 60%.   Hypokinesis of the basal-midanteroseptal and inferoseptal   myocardium. Doppler parameters are consistent with a reversible   restrictive pattern, indicative of decreased left ventricular   diastolic compliance and/or increased left atrial pressure (grade   3 diastolic dysfunction). - Aortic valve: Valve mobility was restricted. Transvalvular   velocity was within the normal range. There was no stenosis.   There was no regurgitation. Valve area (VTI): 2.75 cm^2. Valve   area (Vmax): 3.08 cm^2. Valve area (Vmean): 3.6 cm^2. - Mitral valve: Calcified annulus. Transvalvular velocity was   within the normal range. There was no evidence for stenosis.   There was mild regurgitation. - Left atrium: The atrium was mildly dilated. - Right ventricle: Systolic function was normal. - Tricuspid valve: There was moderate-severe regurgitation. - Pulmonic valve: There was moderate regurgitation. - Pulmonary arteries: Systolic pressure was mildly  increased. PA   peak pressure: 45 mm Hg (S).  Impressions: - Visually, there appears to be severe aortic stenosis. However,   gradients across the aortic valve are consistent with mild aortic   stenosis. Consider repeating limited echo and looking at right   parasternal views, TEE or cath if clinically indicated.   Laboratory Data:  Chemistry Recent Labs  Lab 06/11/17 1517  NA 130*  K 4.5  CL 96*  GLUCOSE 104*  BUN 38*  CREATININE 4.70*    No results for input(s): PROT, ALBUMIN, AST, ALT, ALKPHOS, BILITOT in the last 168 hours. Hematology Recent Labs  Lab 06/05/17 0628 06/11/17 1454 06/11/17 1517  WBC 6.6 11.6*  --   RBC 4.07* 3.93*  --   HGB 11.2* 10.9* 11.2*  HCT 35.4* 34.8* 33.0*  MCV 87.0 88.5  --   MCH 27.5 27.7  --   MCHC 31.6 31.3  --   RDW 14.8 15.0  --   PLT 210 210  --    Cardiac EnzymesNo results for input(s): TROPONINI in the last 168 hours. No results for input(s): TROPIPOC in the last 168 hours.  BNPNo results for input(s): BNP, PROBNP in the last 168 hours.  DDimer No results for input(s): DDIMER in the last 168 hours.  Radiology/Studies:  Dg Chest Portable 1 View  Result Date: 06/11/2017 CLINICAL DATA:  Chest pain and bradycardia EXAM: PORTABLE CHEST 1 VIEW COMPARISON:  Jun 03, 2017 FINDINGS: There is patchy  infiltrate in the left base with small left pleural effusion. There is stable apical pleural thickening bilaterally. Lungs elsewhere are clear. Heart is mildly enlarged with pulmonary vascularity normal. No adenopathy. There is aortic atherosclerosis. There is calcification in each carotid artery. No bony lesions evident. IMPRESSION: Patchy infiltrate left base with small left pleural effusion. Stable apical pleural thickening. Stable cardiomegaly. There is aortic atherosclerosis as well as extensive visualized carotid artery calcification bilaterally. Aortic Atherosclerosis (ICD10-I70.0). Electronically Signed   By: Lowella Grip III M.D.   On:  06/11/2017 15:39    Assessment and Plan:   1. Medication-mediated bradycardia - recent discharge 06/04/17 after stroke with the following medication changes: 30 mg cardizem PRN was discontinued, cardizem 120 mg CD started, eliquis 2.5 mgBID started, valsartan D/C'ed - per the home health nurse, he had been taking cardizem twice each day in stead of once daily - he has not had any bleeding issues on the low dose eliquis - will D/C eliquis with renal failure and start heparin drip - can't rule out episodes of atrial fibrillation especially given his recent stroke  This patients CHA2DS2-VASc Score and unadjusted Ischemic Stroke Rate (% per year) is equal to 9.7 % stroke rate/year from a score of 6 (HTN, stroke, age, plaque). May consider repeat event monitor in the future to assess Afib burden and rate control.    2. Mild aortic stenosis - murmur on exam - stable, no shortness of breath   3. HTN - pressures well-controlled - no anti-hypertensives at this time   4. Back pain - pt describes lower back pain that started yesterday - will order lumbar xray   5. Acute renal insufficiency - creatinine on admission 4.70 - per medicine - will change eliquis to heparin   Plan to admit to Medicine to a stepdown unit for continuous telemetry monitoring. Avoid all AV nodal blocking agents. Will let effects of cardizem dissipate over the next 24-48 hrs. Underlying rhythm will guide next steps in his medication selection. Continue eliquis.     For questions or updates, please contact [place] Please consult [email or web address] for contact info under Cardiology/STEMI.    Signed, Ledora Bottcher, PA  06/11/2017 4:22 PM   I have seen and examined the patient along with Ledora Bottcher, PA.  I have reviewed the chart, notes and new data.  I agree with PA/NP's note.  Key new complaints: tolerating severe bradycardia very well, essentially asymptomatic at rest Key examination changes:  bradycardia, very harsh holosystolic murmur LLSB, probably separate AS murmur, early peaking RUSB, no diastolic murmurs, prominent cervicothoracic collateral superficial veins Key new findings / data: echo reports reviewed. Suspect last study underestimated his AS. Unusual variability in assessment of PV and TV abnormalities. Will review images. Acute on chronic renal failure.  Reviewed all the ECGs and all the rhythm strips. I do not find any convincing evidence of atrial fibrillation There is consistent evidence of very long PR interval, that worsens at faster heart rate and mimics accelerated junctional rhythm. The ECG labeled "atrial fibrillation" has clear P waves - easy to miss due to very long PR interval (sinus bradycardia, PACs, junctional escape beats). Today's ECG shows low junctional or idioventricular escape rhythm with occasional atrial capture/fusion beats.  PLAN: Suspect renal failure is due to bradycardia. Stop diltiazem. Monitor as an inpatient. Need extended event monitor to evaluate for atrial fibrillation, since the patient is definitely at risk for atrial fibrillation and he had a recent stroke. Would continue anticoagulation,  but switch to IV heparin until renal parameters improve. Event monitor may also provide more evidence for need for permanent pacemaker (although he is remarkably asymptomatic). Will review his echocardiograms to clarify hemodynamic significance of his valvular abnormalities.Sanda Klein, MD, [place] [phone] 06/11/2017, 4:58 PM

## 2017-06-12 ENCOUNTER — Inpatient Hospital Stay (HOSPITAL_COMMUNITY): Payer: Medicare Other

## 2017-06-12 ENCOUNTER — Encounter (HOSPITAL_COMMUNITY): Payer: Self-pay | Admitting: General Practice

## 2017-06-12 ENCOUNTER — Other Ambulatory Visit: Payer: Self-pay

## 2017-06-12 DIAGNOSIS — I272 Pulmonary hypertension, unspecified: Secondary | ICD-10-CM

## 2017-06-12 DIAGNOSIS — J431 Panlobular emphysema: Secondary | ICD-10-CM

## 2017-06-12 DIAGNOSIS — E871 Hypo-osmolality and hyponatremia: Secondary | ICD-10-CM

## 2017-06-12 DIAGNOSIS — I350 Nonrheumatic aortic (valve) stenosis: Secondary | ICD-10-CM

## 2017-06-12 DIAGNOSIS — G8929 Other chronic pain: Secondary | ICD-10-CM

## 2017-06-12 DIAGNOSIS — I495 Sick sinus syndrome: Secondary | ICD-10-CM

## 2017-06-12 DIAGNOSIS — I639 Cerebral infarction, unspecified: Secondary | ICD-10-CM

## 2017-06-12 DIAGNOSIS — I471 Supraventricular tachycardia: Secondary | ICD-10-CM

## 2017-06-12 DIAGNOSIS — M545 Low back pain: Secondary | ICD-10-CM

## 2017-06-12 DIAGNOSIS — Z8673 Personal history of transient ischemic attack (TIA), and cerebral infarction without residual deficits: Secondary | ICD-10-CM

## 2017-06-12 DIAGNOSIS — I4892 Unspecified atrial flutter: Secondary | ICD-10-CM

## 2017-06-12 LAB — CBC
HEMATOCRIT: 33 % — AB (ref 39.0–52.0)
Hemoglobin: 10.5 g/dL — ABNORMAL LOW (ref 13.0–17.0)
MCH: 27.6 pg (ref 26.0–34.0)
MCHC: 31.8 g/dL (ref 30.0–36.0)
MCV: 86.6 fL (ref 78.0–100.0)
Platelets: 181 10*3/uL (ref 150–400)
RBC: 3.81 MIL/uL — ABNORMAL LOW (ref 4.22–5.81)
RDW: 14.7 % (ref 11.5–15.5)
WBC: 9.5 10*3/uL (ref 4.0–10.5)

## 2017-06-12 LAB — BASIC METABOLIC PANEL
Anion gap: 6 (ref 5–15)
BUN: 34 mg/dL — AB (ref 6–20)
CHLORIDE: 103 mmol/L (ref 101–111)
CO2: 25 mmol/L (ref 22–32)
Calcium: 8.3 mg/dL — ABNORMAL LOW (ref 8.9–10.3)
Creatinine, Ser: 3.68 mg/dL — ABNORMAL HIGH (ref 0.61–1.24)
GFR calc Af Amer: 16 mL/min — ABNORMAL LOW (ref >60)
GFR, EST NON AFRICAN AMERICAN: 14 mL/min — AB (ref >60)
GLUCOSE: 100 mg/dL — AB (ref 65–99)
Potassium: 4.9 mmol/L (ref 3.5–5.1)
Sodium: 134 mmol/L — ABNORMAL LOW (ref 135–145)

## 2017-06-12 LAB — APTT
aPTT: 175 seconds (ref 24–36)
aPTT: 95 seconds — ABNORMAL HIGH (ref 24–36)

## 2017-06-12 LAB — BRAIN NATRIURETIC PEPTIDE: B Natriuretic Peptide: 1385.5 pg/mL — ABNORMAL HIGH (ref 0.0–100.0)

## 2017-06-12 LAB — HEPARIN LEVEL (UNFRACTIONATED): Heparin Unfractionated: >2.2 IU/mL — ABNORMAL HIGH (ref 0.30–0.70)

## 2017-06-12 MED ORDER — HEPARIN (PORCINE) IN NACL 100-0.45 UNIT/ML-% IJ SOLN
750.0000 [IU]/h | INTRAMUSCULAR | Status: DC
  Administered 2017-06-13: 800 [IU]/h via INTRAVENOUS
  Filled 2017-06-12 (×2): qty 250

## 2017-06-12 NOTE — Progress Notes (Signed)
ANTICOAGULATION CONSULT NOTE - f/u Consult  Pharmacy Consult for Heparin Indication: Afib and recent CVA   Allergies  Allergen Reactions  . Dyazide [Hydrochlorothiazide W-Triamterene] Other (See Comments)    Caused hyponatremia 04/2012  . Lisinopril Other (See Comments)    cough    Patient Measurements: Height: 6\' 1"  (185.4 cm) Weight: 189 lb (85.7 kg) IBW/kg (Calculated) : 79.9 Heparin Dosing Weight:   Vital Signs: Temp: 97.5 F (36.4 C) (06/05 1145) Temp Source: Oral (06/05 1145) BP: 139/63 (06/05 1145) Pulse Rate: 57 (06/05 1145)  Labs: Recent Labs    06/11/17 1454 06/11/17 1517 06/11/17 1838 06/12/17 0235 06/12/17 1208  HGB 10.9* 11.2*  --  10.5*  --   HCT 34.8* 33.0*  --  33.0*  --   PLT 210  --   --  181  --   APTT  --   --  51* 175* 95*  HEPARINUNFRC  --   --  >2.20* >2.20*  --   CREATININE 4.76* 4.70*  --  3.68*  --   TROPONINI 0.03*  --   --   --   --     Estimated Creatinine Clearance: 16.9 mL/min (A) (by C-G formula based on SCr of 3.68 mg/dL (H)).   Medical History: Past Medical History:  Diagnosis Date  . Abnormal EKG    ST changes with normal coronaries by cath 05/06/12.  . Arthritis    "hands" (01/12/2013); feet (podiatry consultation)  . Atrial flutter ([place])    a. Slow ~100bpm when in 2:1; dx 04/2012. b. Placed on Xarelto.  c. s/p ablation 07-08-2012 by Dr Rayann Heman  . Atrial tachycardia ([place])   . Carotid artery calcification    By CXR - dopplers 05/06/12 without obvious evidence of significant ICA stenosis >40%  . Colon polyps 06/26/2005  . COPD (chronic obstructive pulmonary disease) ([place])   . Diverticulosis of colon (without mention of hemorrhage)   . Dysrhythmia   . Gastritis, chronic    Pt denies bleeding 04/2012. EGD 2012 reportedly normal.  . GERD (gastroesophageal reflux disease)   . Hypertension   . Hyponatremia    04/2012 r/t diuretic.  . IBS (irritable bowel syndrome)   . Internal and external hemorrhoids without complication   .  Macular degeneration of left eye   . Oral cancer ([place]) 02/1997  . Pneumonia    "couple times" (01/12/2013)  . Shortness of breath dyspnea    walking distances   Assessment: Anticoag: apix PTA for AFib and recent CVA found to have bradycardia and ARF - IV hep for now, last dose apix 6/4 0630. Elevated aPTT this aM now in goal at 95. Hgb 10.5 down slightly today. Plts 181.  Goal of Therapy:  Heparin level 0.3-0.7 APTT 66-102   Plan:  Continue heparin at 1050 units/hr Daily aPTT, heparin level, and CBC  Alford Highland, [employer] 06/12/2017,2:56 PM

## 2017-06-12 NOTE — Progress Notes (Signed)
Progress Note  Patient Name: Chad [name] Date of Encounter: 06/12/2017  Primary Cardiologist: Kirk Ruths, MD   Subjective   He has no complaints this morning. Prolonged bradycardia overnight, with heart rates as low as the high 20s and long pauses, but over the last roughly 6 hours heart rate has been consistently in the 60-70 range, sinus rhythm with a very long first-degree AV block. Slight improvement in renal parameters on labs.  Good urine output.  Inpatient Medications    Scheduled Meds: . atorvastatin  40 mg Oral q1800  . clidinium-chlordiazePOXIDE  1 capsule Oral BID  . famotidine  20 mg Oral QHS  . fluticasone  2 spray Each Nare QHS  . gabapentin  300 mg Oral TID  . lidocaine  1 patch Transdermal Q24H  . pantoprazole  40 mg Oral Daily  . senna-docusate  1 tablet Oral Daily  . sodium chloride flush  3 mL Intravenous Q12H  . vitamin E  100 Units Oral Daily   Continuous Infusions: . sodium chloride 100 mL/hr at 06/11/17 2100  . heparin 1,050 Units/hr (06/12/17 0420)   PRN Meds: acetaminophen **OR** acetaminophen, ondansetron **OR** ondansetron (ZOFRAN) IV   Vital Signs    Vitals:   06/11/17 2017 06/11/17 2351 06/12/17 0601 06/12/17 0832  BP: 125/60 117/66 116/66 (!) 123/58  Pulse: 68 66 75 66  Resp: 16     Temp: 97.8 F (36.6 C) 97.8 F (36.6 C) 97.6 F (36.4 C)   TempSrc: Oral Oral Oral Oral  SpO2: 98% 99% 95% 95%  Weight:   189 lb (85.7 kg)   Height:        Intake/Output Summary (Last 24 hours) at 06/12/2017 0922 Last data filed at 06/12/2017 9798 Gross per 24 hour  Intake 688.4 ml  Output 1425 ml  Net -736.6 ml   Filed Weights   06/11/17 1439 06/11/17 1809 06/12/17 0601  Weight: 191 lb (86.6 kg) 191 lb 1.6 oz (86.7 kg) 189 lb (85.7 kg)    Telemetry    Severe sinus bradycardia, episodes of second-degree AV block with pauses up to 3 seconds overnight; normal sinus rhythm with first-degree AV block over the last several hours- Personally  Reviewed  ECG    No new tracing- Personally Reviewed  Physical Exam  Very comfortable GEN: No acute distress.   Neck: No JVD Cardiac: RRR, no  rubs, or gallops. very harsh holosystolic murmur LLSB, probably separate AS murmur, early peaking at RUSB, no diastolic murmurs, prominent cervicothoracic collateral superficial veins Respiratory: Clear to auscultation bilaterally. GI: Soft, nontender, non-distended  MS: No edema; No deformity. Neuro:  Nonfocal  Psych: Normal affect   Labs    Chemistry Recent Labs  Lab 06/11/17 1454 06/11/17 1517 06/12/17 0235  NA 131* 130* 134*  K 4.6 4.5 4.9  CL 97* 96* 103  CO2 20*  --  25  GLUCOSE 99 104* 100*  BUN 40* 38* 34*  CREATININE 4.76* 4.70* 3.68*  CALCIUM 8.8*  --  8.3*  PROT 7.2  --   --   ALBUMIN 3.3*  --   --   AST 26  --   --   ALT 15*  --   --   ALKPHOS 67  --   --   BILITOT 0.7  --   --   GFRNONAA 10*  --  14*  GFRAA 12*  --  16*  ANIONGAP 14  --  6     Hematology Recent Labs  Lab  06/11/17 1454 06/11/17 1517 06/12/17 0235  WBC 11.6*  --  9.5  RBC 3.93*  --  3.81*  HGB 10.9* 11.2* 10.5*  HCT 34.8* 33.0* 33.0*  MCV 88.5  --  86.6  MCH 27.7  --  27.6  MCHC 31.3  --  31.8  RDW 15.0  --  14.7  PLT 210  --  181    Cardiac Enzymes Recent Labs  Lab 06/11/17 1454  TROPONINI 0.03*   No results for input(s): TROPIPOC in the last 168 hours.   BNP Recent Labs  Lab 06/12/17 0235  BNP 1,385.5*     DDimer No results for input(s): DDIMER in the last 168 hours.   Radiology    Dg Lumbar Spine 2-3 Views  Result Date: 06/11/2017 CLINICAL DATA:  Chronic back pain. EXAM: LUMBAR SPINE - 2-3 VIEW COMPARISON:  CT abdomen pelvis dated November 22, 2015. FINDINGS: Five lumbar type vertebral bodies. No acute fracture or subluxation. Vertebral body heights are preserved. Unchanged 8-9 mm anterolisthesis at L5-S1 due to bilateral L5 pars defects. Severe disc height loss at L5-S1, also unchanged. Remaining intervertebral disc  heights are preserved. Aortic atherosclerosis. IMPRESSION: 1. Unchanged severe degenerative disc disease and grade 1 anterolisthesis at L5-S1 due to bilateral L5 pars defects. Electronically Signed   By: Titus Dubin M.D.   On: 06/11/2017 17:25   US Renal  Result Date: 06/12/2017 CLINICAL DATA:  Acute kidney injury EXAM: RENAL / URINARY TRACT ULTRASOUND COMPLETE COMPARISON:  None. FINDINGS: Right Kidney: Length: 9.6 cm. Echogenicity within normal limits. No mass or hydronephrosis visualized. Left Kidney: Length: 10.4 cm. Echogenicity within normal limits. No mass or hydronephrosis visualized. Small renal cyst measures 9 x 6 x 7 mm. Bladder: Appears normal for degree of bladder distention. Prostate: Prostate is mildly enlarged measuring 3.2 x 3.0 x 5.2 cm. IMPRESSION: 1. No hydronephrosis or other focal renal abnormality. 2. Mildly enlarged prostate gland. Electronically Signed   By: Ulyses Jarred M.D.   On: 06/12/2017 06:41   Dg Chest Portable 1 View  Result Date: 06/11/2017 CLINICAL DATA:  Chest pain and bradycardia EXAM: PORTABLE CHEST 1 VIEW COMPARISON:  Jun 03, 2017 FINDINGS: There is patchy infiltrate in the left base with small left pleural effusion. There is stable apical pleural thickening bilaterally. Lungs elsewhere are clear. Heart is mildly enlarged with pulmonary vascularity normal. No adenopathy. There is aortic atherosclerosis. There is calcification in each carotid artery. No bony lesions evident. IMPRESSION: Patchy infiltrate left base with small left pleural effusion. Stable apical pleural thickening. Stable cardiomegaly. There is aortic atherosclerosis as well as extensive visualized carotid artery calcification bilaterally. Aortic Atherosclerosis (ICD10-I70.0). Electronically Signed   By: Lowella Grip III M.D.   On: 06/11/2017 15:39    Cardiac Studies   Echo 06/04/17: Study Conclusions - Left ventricle: The cavity size was normal. There was mild concentric hypertrophy.  Systolic function was normal. The estimated ejection fraction was in the range of 55% to 60%. Hypokinesis of the basal-midanteroseptal and inferoseptal myocardium. Doppler parameters are consistent with a reversible restrictive pattern, indicative of decreased left ventricular diastolic compliance and/or increased left atrial pressure (grade 3 diastolic dysfunction). - Aortic valve: Valve mobility was restricted. Transvalvular velocity was within the normal range. There was no stenosis. There was no regurgitation. Valve area (VTI): 2.75 cm^2. Valve area (Vmax): 3.08 cm^2. Valve area (Vmean): 3.6 cm^2. - Mitral valve: Calcified annulus. Transvalvular velocity was within the normal range. There was no evidence for stenosis. There was mild regurgitation. -  Left atrium: The atrium was mildly dilated. - Right ventricle: Systolic function was normal. - Tricuspid valve: There was moderate-severe regurgitation. - Pulmonic valve: There was moderate regurgitation. - Pulmonary arteries: Systolic pressure was mildly increased. PA peak pressure: 45 mm Hg (S).  Impressions: - Visually, there appears to be severe aortic stenosis. However, gradients across the aortic valve are consistent with mild aortic stenosis. Consider repeating limited echo and looking at right parasternal views, TEE or cath if clinically indicated.    Patient Profile     84 y.o. male with remote history of atrial flutter ablation, paroxysmal atrial tachycardia, mild aortic stenosis, recently admitted for stroke with presumed embolic etiology, now admitted for severe bradycardia complicated by acute nonoliguric renal failure.  Assessment & Plan    1. Bradycardia: On background of sinus node dysfunction and first-degree AV block, treatment with diltiazem led to severe bradycardia.  Now back in normal rhythm.  Avoid all negative chronotropic agents other than occasional "as needed" p.o. short  acting diltiazem for paroxysmal atrial tachycardia.  He will need a pacemaker at some point in the future, but it does not yet appear to be mandatory.  He remains asymptomatic. 2. PAT: Well documented in the past and on recent telemetry strips, but not require treatment while asymptomatic. 3. PAFib: This diagnosis is not formally documented in my opinion.  Automatic computer interpretation of atrial fibrillation on previous admission is not accurate.  However, he is at high risk for having atrial fibrillation due to his age and previous radiofrequency ablation procedure.  Due to his recent stroke to the middle cerebral artery, highly consistent with embolic etiology, I believe it is best to keep him on anticoagulation until we have a better understanding of his arrhythmia.  If atrial fibrillation is confirmed: CHA2DS2-VASc Score and unadjusted Ischemic Stroke Rate (% per year) is equal to 9.7 % stroke rate/year from a score of 6 (HTN, stroke, age, plaque).  On the other hand, if he does not truly have atrial fibrillation, the oral anticoagulant would expose him to a relatively high bleeding risk in view of his age and renal dysfunction. Plan event monitor at discharge, unless he has clear evidence of atrial fibrillation during this hospital stay. Unfortunately, it is not safe to continue his Eliquis with the current renal function.  Temporarily on intravenous heparin until renal function improves. 4. ARF: It is encouraging that his creatinine is already improving after only a few hours of better heart rate.  Hopefully will see substantial further improvement in renal function by tomorrow and can restart his Eliquis.  Avoid all drugs with potential for worsening renal function such as NSAIDs, contrast and RAAS inhibitors. 5. HTN: Excellent control. 6. AS: I reviewed his echoes.  Clearly he has moderate aortic stenosis with a gradient severely underestimated on the most recent echocardiogram.  I also believe  that the severity of his pulmonic regurgitation and tricuspid regurgitation was overestimated on the most recent report.  Both those lesions are only mild to moderate.  For questions or updates, please contact [place] Please consult [email or web address] for contact info under Cardiology/STEMI.      Signed, Sanda Klein, MD  06/12/2017, 9:22 AM

## 2017-06-12 NOTE — Progress Notes (Signed)
PROGRESS NOTE    Chad [name]  MVH:[id number] DOB: [birth year] DOA: 06/11/2017 PCP: Tresa Garter, MD   Brief Narrative:   84 y.o. male with PMHx Acute RIGHT MCA area ischemic CVA, CVA with previous documentation of Tachybradycardia syndrome, Atrial flutter HTN Chronic Diastolic CHF, CKd stage III, COPD with bronchiectasis, Dyslipidemia and Lumbar spine osteoarthritis.   Discharged from the hospital on 5/29 after an admission for stroke as above.  Had associated acute kidney injury and given severity of chronic kidney disease his Diovan was discontinued.  Attending physician during previous admission felt patient was in atrial fibrillation so Cardizem was initiated along with Eliquis given recent stroke.  Patient states that previous stroke symptoms involving the left upper extremity have resolved.    Patient was with physical therapy today with the therapist noted the patient was bradycardic and sent him to the ER for further evaluation.  When I spoke with the patient he denied issues such as dizziness, shortness of breath, chest pain or difficulty mobilizing.  Patient reported to the triage nurse that he was having chest pain stating he accidentally took extra diltiazem and took his valsartan which had been discontinued during the previous admission.  In the ER the patient was normotensive although SBP optimal between 90 and 110.  Heart rates were in the 30s with underlying junctional rhythm.  Creatinine has doubled since discharge 2.1 to 4.76.  It is unknown how long the patient has been bradycardic.  He has been evaluated by cardiology who report current rhythm is accelerated junctional rhythm.  EKGs from previous admission demonstrate sinus bradycardia with prolonged PR interval and junctional beats and admission EKG during previous admission demonstrated junctional rhythm with a heart rate in the 40s.  Due to the severity of his acute kidney injury and low GFR his previous Eliquis has been  changed to IV heparin.   ED Course:  Vital Signs: BP (!) 149/56 (BP Location: Right Arm)   Pulse (!) 36   Temp 98.1 F (36.7 C) (Oral)   Resp 17   Ht 6\' 1"  (1.854 m)   Wt 86.7 kg (191 lb 1.6 oz)   SpO2 100%   BMI 25.21 kg/m  CXR: Patchy infiltrate left base with small left pleural effusion and stable apical pleural thickening Lumbar spine: Unchanged severe degenerative disc disease and grade 1 anterolisthesis at L5-S1 due to bilateral L5 pars defects Lab data: Sodium 131, potassium 4.6, chloride 97, CO2 20, glucose 99, BUN 40, creatinine 4.76, anion gap 14, LFTs not elevated, troponin 0.03, white count 11,600 with neutrophils 72% and absolute neutrophils 8.4%, hemoglobin 10.9, platelets 210,000 Medications and treatments: None    Subjective: 6/5 A/O x4, negative CP, negative S OB, negative abdominal pain.   Assessment & Plan:   Principal Problem:   Acute kidney injury (HCC) Active Problems:   Back pain   Symptomatic bradycardia   Hypertension   COPD (chronic obstructive pulmonary disease) w/ bronchiectasis   History of CVA (cerebrovascular accident)-May 2019   Acute hyponatremia   Chronic kidney disease (CKD), stage IV (severe) (HCC)   Chronic diastolic heart failure (HCC)   HLD (hyperlipidemia)   Paroxysmal atrial flutter (HCC)  Acute on CKD stage IV (baseline Cr 2.1) - Acute renal failure most likely secondary to bradycardia--> hypotension - Per patient patient mistakenly doses of Diovan which had been discontinued on previous admission.  Also plan to his acute renal failure. Recent Labs  Lab 06/11/17 1454 06/11/17 1517 06/12/17 0235  CREATININE 4.76*  4.70* 3.68*  -Improving with improved cardiac function. -Normal saline 116ml/hr -Avoid renal toxic medication - Renal ultrasound pending -Unsafe for patient be on Eliquis with renal failure  Symptomatic Bradycardia/Paroxysmal atrial flutter -Cardiology on board -On background of sinus node dysfunction and  first-degree AV block, treatment with diltiazem led to severe bradycardia.  Now back in normal rhythm.   -Avoid all negative chronotropic agents other than occasional "as needed" p.o. short acting diltiazem for paroxysmal atrial tachycardia.   -He will need a pacemaker at some point in the future, but it does not yet appear to be mandatory.  He remains asymptomatic. - P-AFib:  not formally documented per cardiology.  Automatic computer interpretation of atrial fibrillation on previous admission is not accurate.  However, he is at high risk for having atrial fibrillation due to his age and previous radiofrequency ablation procedure.  Due to his recent stroke to the middle cerebral artery, highly consistent with embolic etiology, I believe it is best to keep him on anticoagulation until we have a better understanding of his arrhythmia.  If atrial fibrillation is confirmed: CHA2DS2-VASc Score and unadjusted Ischemic Stroke Rate (% per year) is equal to 9.7 % stroke rate/year from a score of 6 (HTN, stroke, age, plaque).  On the other hand, if he does not truly have atrial fibrillation, the oral anticoagulant would expose him to a relatively high bleeding risk in view of his age and renal dysfunction. Plan event monitor at discharge, unless he has clear evidence of atrial fibrillation during this hospital stay.   Chronic diastolic CHF -Echocardiogram 06/04/2017 EF 55 to 60%, grade 3 diastolic dysfunction.  Pulmonary hypertension 45 mmHg -Strict in and out -Daily weight -Transfuse for hemoglobin<8  Pulmonary HTN -CHF   Essential HTN -Controlled without medication  COPD with Bronchiectasis -Stable   Acute hyponatremia - Most likely secondary to acute on CKD stage IV and hypoperfusion. - Serum osmolality normal: Urine osmolality/sodium pending -Not on diuretics - Resolving        CVA (May 2019 -Residual right facial droop    -Continue heparin -Continue Lipitor  Back pain - Chronic severe  DDD and grade 1 anterolisthesis at L5-S1  -Conservative management -Avoid NSAIDs secondary to acute on chronic renal failure - Avoid narcotics for chronic pain secondary to acute on chronic kidney failure   HLD -Lipitor 40 mg daily           DVT prophylaxis: Heparin per pharmacy Code Status: Full Family Communication: None Disposition Plan: Per cardiology   Consultants:  Cardiology     Procedures/Significant Events:     I have personally reviewed and interpreted all radiology studies and my findings are as above.  VENTILATOR SETTINGS:    Cultures   Antimicrobials:    Devices    LINES / TUBES:      Continuous Infusions: . sodium chloride 100 mL/hr at 06/11/17 2100  . heparin 1,050 Units/hr (06/12/17 0420)     Objective: Vitals:   06/11/17 2017 06/11/17 2351 06/12/17 0601 06/12/17 0832  BP: 125/60 117/66 116/66 (!) 123/58  Pulse: 68 66 75 66  Resp: 16     Temp: 97.8 F (36.6 C) 97.8 F (36.6 C) 97.6 F (36.4 C)   TempSrc: Oral Oral Oral Oral  SpO2: 98% 99% 95% 95%  Weight:   189 lb (85.7 kg)   Height:        Intake/Output Summary (Last 24 hours) at 06/12/2017 0846 Last data filed at 06/12/2017 0832 Gross per 24 hour  Intake 688.4 ml  Output 1425 ml  Net -736.6 ml   Filed Weights   06/11/17 1439 06/11/17 1809 06/12/17 0601  Weight: 191 lb (86.6 kg) 191 lb 1.6 oz (86.7 kg) 189 lb (85.7 kg)    Examination:  General: A/O x4, No acute respiratory distress Neck:  Negative scars, masses, torticollis, lymphadenopathy, JVD Lungs: Clear to auscultation bilaterally without wheezes or crackles Cardiovascular: Regular rate and rhythm without murmur gallop or rub normal S1 and S2 Abdomen: negative abdominal pain, nondistended, positive soft, bowel sounds, no rebound, no ascites, no appreciable mass Extremities: No significant cyanosis, clubbing, or edema bilateral lower extremities Skin: Negative rashes, lesions, ulcers Psychiatric:  Negative  depression, negative anxiety, negative fatigue, negative mania  Central nervous system:  Cranial nerves II through XII intact, tongue/uvula midline, all extremities muscle strength 5/5, sensation intact throughout, mild left facial droop residual from previous CVA, positive mild dysarthria, negative expressive aphasia, negative receptive aphasia.  .     Data Reviewed: Care during the described time interval was provided by me .  I have reviewed this patient's available data, including medical history, events of note, physical examination, and all test results as part of my evaluation.   CBC: Recent Labs  Lab 06/11/17 1454 06/11/17 1517 06/12/17 0235  WBC 11.6*  --  9.5  NEUTROABS 8.4*  --   --   HGB 10.9* 11.2* 10.5*  HCT 34.8* 33.0* 33.0*  MCV 88.5  --  86.6  PLT 210  --  181   Basic Metabolic Panel: Recent Labs  Lab 06/11/17 1454 06/11/17 1517 06/11/17 1838 06/12/17 0235  NA 131* 130*  --  134*  K 4.6 4.5  --  4.9  CL 97* 96*  --  103  CO2 20*  --   --  25  GLUCOSE 99 104*  --  100*  BUN 40* 38*  --  34*  CREATININE 4.76* 4.70*  --  3.68*  CALCIUM 8.8*  --   --  8.3*  MG  --   --  2.3  --    GFR: Estimated Creatinine Clearance: 16.9 mL/min (A) (by C-G formula based on SCr of 3.68 mg/dL (H)). Liver Function Tests: Recent Labs  Lab 06/11/17 1454  AST 26  ALT 15*  ALKPHOS 67  BILITOT 0.7  PROT 7.2  ALBUMIN 3.3*   No results for input(s): LIPASE, AMYLASE in the last 168 hours. No results for input(s): AMMONIA in the last 168 hours. Coagulation Profile: No results for input(s): INR, PROTIME in the last 168 hours. Cardiac Enzymes: Recent Labs  Lab 06/11/17 1454  TROPONINI 0.03*   BNP (last 3 results) Recent Labs    04/24/17 1450  PROBNP 246.0*   HbA1C: No results for input(s): HGBA1C in the last 72 hours. CBG: No results for input(s): GLUCAP in the last 168 hours. Lipid Profile: No results for input(s): CHOL, HDL, LDLCALC, TRIG, CHOLHDL, LDLDIRECT  in the last 72 hours. Thyroid Function Tests: Recent Labs    06/11/17 1838  TSH 5.393*   Anemia Panel: No results for input(s): VITAMINB12, FOLATE, FERRITIN, TIBC, IRON, RETICCTPCT in the last 72 hours. Urine analysis:    Component Value Date/Time   COLORURINE YELLOW 06/11/2017 2325   APPEARANCEUR CLEAR 06/11/2017 2325   LABSPEC 1.005 06/11/2017 2325   PHURINE 6.0 06/11/2017 2325   GLUCOSEU NEGATIVE 06/11/2017 2325   GLUCOSEU NEGATIVE 04/24/2016 0728   HGBUR NEGATIVE 06/11/2017 2325   BILIRUBINUR NEGATIVE 06/11/2017 2325   BILIRUBINUR negative 05/31/2015  0910   BILIRUBINUR Negative 04/28/2014 1516   KETONESUR NEGATIVE 06/11/2017 2325   PROTEINUR NEGATIVE 06/11/2017 2325   UROBILINOGEN 0.2 04/24/2016 0728   NITRITE NEGATIVE 06/11/2017 2325   LEUKOCYTESUR NEGATIVE 06/11/2017 2325   Sepsis Labs: @LABRCNTIP (procalcitonin:4,lacticidven:4)  ) Recent Results (from the past 240 hour(s))  MRSA PCR Screening     Status: None   Collection Time: 06/11/17  6:20 PM  Result Value Ref Range Status   MRSA by PCR NEGATIVE NEGATIVE Final    Comment:        The GeneXpert MRSA Assay (FDA approved for NASAL specimens only), is one component of a comprehensive MRSA colonization surveillance program. It is not intended to diagnose MRSA infection nor to guide or monitor treatment for MRSA infections. Performed at [facility], [address], [place], [place] [address]          Radiology Studies: Dg Lumbar Spine 2-3 Views  Result Date: 06/11/2017 CLINICAL DATA:  Chronic back pain. EXAM: LUMBAR SPINE - 2-3 VIEW COMPARISON:  CT abdomen pelvis dated November 22, 2015. FINDINGS: Five lumbar type vertebral bodies. No acute fracture or subluxation. Vertebral body heights are preserved. Unchanged 8-9 mm anterolisthesis at L5-S1 due to bilateral L5 pars defects. Severe disc height loss at L5-S1, also unchanged. Remaining intervertebral disc heights are preserved. Aortic atherosclerosis.  IMPRESSION: 1. Unchanged severe degenerative disc disease and grade 1 anterolisthesis at L5-S1 due to bilateral L5 pars defects. Electronically Signed   By: Obie Dredge M.D.   On: 06/11/2017 17:25   US Renal  Result Date: 06/12/2017 CLINICAL DATA:  Acute kidney injury EXAM: RENAL / URINARY TRACT ULTRASOUND COMPLETE COMPARISON:  None. FINDINGS: Right Kidney: Length: 9.6 cm. Echogenicity within normal limits. No mass or hydronephrosis visualized. Left Kidney: Length: 10.4 cm. Echogenicity within normal limits. No mass or hydronephrosis visualized. Small renal cyst measures 9 x 6 x 7 mm. Bladder: Appears normal for degree of bladder distention. Prostate: Prostate is mildly enlarged measuring 3.2 x 3.0 x 5.2 cm. IMPRESSION: 1. No hydronephrosis or other focal renal abnormality. 2. Mildly enlarged prostate gland. Electronically Signed   By: Deatra Robinson M.D.   On: 06/12/2017 06:41   Dg Chest Portable 1 View  Result Date: 06/11/2017 CLINICAL DATA:  Chest pain and bradycardia EXAM: PORTABLE CHEST 1 VIEW COMPARISON:  Jun 03, 2017 FINDINGS: There is patchy infiltrate in the left base with small left pleural effusion. There is stable apical pleural thickening bilaterally. Lungs elsewhere are clear. Heart is mildly enlarged with pulmonary vascularity normal. No adenopathy. There is aortic atherosclerosis. There is calcification in each carotid artery. No bony lesions evident. IMPRESSION: Patchy infiltrate left base with small left pleural effusion. Stable apical pleural thickening. Stable cardiomegaly. There is aortic atherosclerosis as well as extensive visualized carotid artery calcification bilaterally. Aortic Atherosclerosis (ICD10-I70.0). Electronically Signed   By: Bretta Bang III M.D.   On: 06/11/2017 15:39        Scheduled Meds: . atorvastatin  40 mg Oral q1800  . clidinium-chlordiazePOXIDE  1 capsule Oral BID  . famotidine  20 mg Oral QHS  . fluticasone  2 spray Each Nare QHS  . gabapentin   300 mg Oral TID  . lidocaine  1 patch Transdermal Q24H  . pantoprazole  40 mg Oral Daily  . senna-docusate  1 tablet Oral Daily  . sodium chloride flush  3 mL Intravenous Q12H  . vitamin E  100 Units Oral Daily   Continuous Infusions: . sodium chloride 100  mL/hr at 06/11/17 2100  . heparin 1,050 Units/hr (06/12/17 0420)     LOS: 1 day    Time spent: 40 minutes    Lilee Aldea, Roselind Messier, MD [facility] Pager [phone]   If 7PM-7AM, please contact night-coverage [email or web address] Password [facility] 06/12/2017, 8:46 AM

## 2017-06-12 NOTE — Telephone Encounter (Signed)
Pt went to ED

## 2017-06-12 NOTE — Progress Notes (Signed)
Notified by CCMD pt had a 4.2 and a 3.7 second slow ventricular response w/HR dropping to 28bpm. Pt completely asymptomatic, denies dizziness/CP/SOB or any other complaints. Strips saved, will continue to monitor. Jessie Foot, RN

## 2017-06-12 NOTE — Progress Notes (Signed)
[place] for Heparin (Apixaban on hold) Indication: atrial fibrillation  Allergies  Allergen Reactions  . Dyazide [Hydrochlorothiazide W-Triamterene] Other (See Comments)    Caused hyponatremia 04/2012  . Lisinopril Other (See Comments)    cough    Patient Measurements: Height: 6\' 1"  (185.4 cm) Weight: 191 lb 1.6 oz (86.7 kg) IBW/kg (Calculated) : 79.9 Heparin Dosing Weight: 86.6 kg  Vital Signs: Temp: 97.8 F (36.6 C) (06/04 2351) Temp Source: Oral (06/04 2351) BP: 117/66 (06/04 2351) Pulse Rate: 66 (06/04 2351)  Labs: Recent Labs    06/11/17 1454 06/11/17 1517 06/11/17 1838 06/12/17 0235  HGB 10.9* 11.2*  --  10.5*  HCT 34.8* 33.0*  --  33.0*  PLT 210  --   --  181  APTT  --   --  51* 175*  HEPARINUNFRC  --   --  >2.20*  --   CREATININE 4.76* 4.70*  --   --   TROPONINI 0.03*  --   --   --     Estimated Creatinine Clearance: 13.2 mL/min (A) (by C-G formula based on SCr of 4.7 mg/dL (H)).   Medical History: Past Medical History:  Diagnosis Date  . Abnormal EKG    ST changes with normal coronaries by cath 05/06/12.  . Arthritis    "hands" (01/12/2013); feet (podiatry consultation)  . Atrial flutter ([place])    a. Slow ~100bpm when in 2:1; dx 04/2012. b. Placed on Xarelto.  c. s/p ablation 07-08-2012 by Dr Rayann Heman  . Atrial tachycardia ([place])   . Carotid artery calcification    By CXR - dopplers 05/06/12 without obvious evidence of significant ICA stenosis >40%  . Colon polyps 06/26/2005  . COPD (chronic obstructive pulmonary disease) ([place])   . Diverticulosis of colon (without mention of hemorrhage)   . Dysrhythmia   . Gastritis, chronic    Pt denies bleeding 04/2012. EGD 2012 reportedly normal.  . GERD (gastroesophageal reflux disease)   . Hypertension   . Hyponatremia    04/2012 r/t diuretic.  . IBS (irritable bowel syndrome)   . Internal and external hemorrhoids without complication   . Macular degeneration of left eye   .  Oral cancer ([place]) 02/1997  . Pneumonia    "couple times" (01/12/2013)  . Shortness of breath dyspnea    walking distances    Assessment: 45 yoM admitted with bradycardia. Pt on Eliquis PTA for AFib to transition to heparin per pharmacy while admitted with concern for renal failure. Last dose of apixaban was PTA at 0630 per pt.  6/5 AM update: aPTT is elevated at 175, using aPTT to dose for now given apixaban influence on anti-Xa levels, no issues per RN.   Goal of Therapy:  Heparin level 0.3-0.7 units/mL APTT 66-102 secs Monitor platelets by anticoagulation protocol: Yes   Plan:  -Hold heparin x 1 hr -Re-start heparin at 1050 units/hr at 0415 -1230 aPTT -Daily CBC/aPTT/HL  -Monitor for bleeding  Narda Bonds, PharmD, BCPS Clinical Pharmacist Phone: [phone]

## 2017-06-13 DIAGNOSIS — E7849 Other hyperlipidemia: Secondary | ICD-10-CM

## 2017-06-13 LAB — APTT
APTT: 136 s — AB (ref 24–36)
APTT: 77 s — AB (ref 24–36)
APTT: 94 s — AB (ref 24–36)

## 2017-06-13 LAB — BASIC METABOLIC PANEL
Anion gap: 6 (ref 5–15)
BUN: 19 mg/dL (ref 6–20)
CALCIUM: 8.2 mg/dL — AB (ref 8.9–10.3)
CO2: 24 mmol/L (ref 22–32)
CREATININE: 1.97 mg/dL — AB (ref 0.61–1.24)
Chloride: 107 mmol/L (ref 101–111)
GFR, EST AFRICAN AMERICAN: 34 mL/min — AB (ref >60)
GFR, EST NON AFRICAN AMERICAN: 29 mL/min — AB (ref >60)
Glucose, Bld: 107 mg/dL — ABNORMAL HIGH (ref 65–99)
Potassium: 4.8 mmol/L (ref 3.5–5.1)
SODIUM: 137 mmol/L (ref 135–145)

## 2017-06-13 LAB — CBC
HCT: 34.2 % — ABNORMAL LOW (ref 39.0–52.0)
Hemoglobin: 10.9 g/dL — ABNORMAL LOW (ref 13.0–17.0)
MCH: 27.9 pg (ref 26.0–34.0)
MCHC: 31.9 g/dL (ref 30.0–36.0)
MCV: 87.7 fL (ref 78.0–100.0)
PLATELETS: 202 10*3/uL (ref 150–400)
RBC: 3.9 MIL/uL — AB (ref 4.22–5.81)
RDW: 14.8 % (ref 11.5–15.5)
WBC: 7.9 10*3/uL (ref 4.0–10.5)

## 2017-06-13 LAB — MAGNESIUM: Magnesium: 1.8 mg/dL (ref 1.7–2.4)

## 2017-06-13 LAB — URINE CULTURE

## 2017-06-13 LAB — HEPARIN LEVEL (UNFRACTIONATED): HEPARIN UNFRACTIONATED: 1.7 [IU]/mL — AB (ref 0.30–0.70)

## 2017-06-13 MED ORDER — AMLODIPINE BESYLATE 2.5 MG PO TABS
2.5000 mg | ORAL_TABLET | Freq: Every day | ORAL | Status: DC
  Administered 2017-06-14: 2.5 mg via ORAL
  Filled 2017-06-13: qty 1

## 2017-06-13 NOTE — Progress Notes (Signed)
[place] for Heparin (Apixaban on hold) Indication: atrial fibrillation, recent CVA  Allergies  Allergen Reactions  . Dyazide [Hydrochlorothiazide W-Triamterene] Other (See Comments)    Caused hyponatremia 04/2012  . Lisinopril Other (See Comments)    cough    Patient Measurements: Height: 6\' 1"  (185.4 cm) Weight: 187 lb 6.4 oz (85 kg) IBW/kg (Calculated) : 79.9 Heparin Dosing Weight: 86.6 kg  Vital Signs: Temp: 97.7 F (36.5 C) (06/06 0417) Temp Source: Oral (06/06 0417) BP: 154/68 (06/06 0502) Pulse Rate: 78 (06/06 0417)  Labs: Recent Labs    06/11/17 1454 06/11/17 1517  06/11/17 1838 06/12/17 0235 06/12/17 1208 06/13/17 0354  HGB 10.9* 11.2*  --   --  10.5*  --  10.9*  HCT 34.8* 33.0*  --   --  33.0*  --  34.2*  PLT 210  --   --   --  181  --  202  APTT  --   --    < > 51* 175* 95* 136*  HEPARINUNFRC  --   --   --  >2.20* >2.20*  --  1.70*  CREATININE 4.76* 4.70*  --   --  3.68*  --   --   TROPONINI 0.03*  --   --   --   --   --   --    < > = values in this interval not displayed.    Estimated Creatinine Clearance: 16.9 mL/min (A) (by C-G formula based on SCr of 3.68 mg/dL (H)).   Medical History: Past Medical History:  Diagnosis Date  . Abnormal EKG    ST changes with normal coronaries by cath 05/06/12.  . Arthritis    "hands" (01/12/2013); feet (podiatry consultation); "back" (06/12/2017)  . Atrial flutter ([place])    a. Slow ~100bpm when in 2:1; dx 04/2012. b. Placed on Xarelto.  c. s/p ablation 07-08-2012 by Dr Rayann Heman  . Atrial tachycardia ([place])   . Carotid artery calcification    By CXR - dopplers 05/06/12 without obvious evidence of significant ICA stenosis >40%  . Chronic lower back pain   . Colon polyps 06/26/2005  . COPD (chronic obstructive pulmonary disease) ([place])   . Diverticulosis of colon (without mention of hemorrhage)   . Dysrhythmia   . Gastritis, chronic    Pt denies bleeding 04/2012. EGD 2012 reportedly  normal.  . GERD (gastroesophageal reflux disease)   . Heart murmur   . Hypertension   . Hyponatremia    04/2012 r/t diuretic.  . IBS (irritable bowel syndrome)   . Internal and external hemorrhoids without complication   . Macular degeneration of left eye   . Pneumonia    "@ least 5 times" (06/12/2017)  . Shortness of breath dyspnea    walking distances  . Tongue cancer ([place]) 02/1997    Assessment: 75 yoM admitted with bradycardia. Pt on Eliquis PTA for AFib to transition to heparin per pharmacy while admitted with concern for renal failure. Last dose of apixaban was PTA at 0630 per pt.  6/5 AM update: aPTT is elevated at 136, using aPTT to dose for now given apixaban influence on anti-Xa levels, no issues per RN.   Goal of Therapy:  Heparin level 0.3-0.5 units/mL APTT 66-84 secs  Monitor platelets by anticoagulation protocol: Yes   Plan:  -Reduce heparin to 800 units/hr -1400 aPTT -Daily CBC/aPTT/HL  -Monitor for bleeding  Narda Bonds, PharmD, BCPS Clinical Pharmacist Phone: [phone]

## 2017-06-13 NOTE — Progress Notes (Addendum)
PROGRESS NOTE    Chad [name]  ZOX:[id number] DOB: [birth year] DOA: 06/11/2017 PCP: Tresa Garter, MD   Brief Narrative:   84 y.o. male with PMHx Acute RIGHT MCA area ischemic CVA, CVA with previous documentation of Tachybradycardia syndrome, Atrial flutter HTN Chronic Diastolic CHF, CKd stage III, COPD with bronchiectasis, Dyslipidemia and Lumbar spine osteoarthritis.   Discharged from the hospital on 5/29 after an admission for stroke as above.  Had associated acute kidney injury and given severity of chronic kidney disease his Diovan was discontinued.  Attending physician during previous admission felt patient was in atrial fibrillation so Cardizem was initiated along with Eliquis given recent stroke.  Patient states that previous stroke symptoms involving the left upper extremity have resolved.    Patient was with physical therapy today with the therapist noted the patient was bradycardic and sent him to the ER for further evaluation.  When I spoke with the patient he denied issues such as dizziness, shortness of breath, chest pain or difficulty mobilizing.  Patient reported to the triage nurse that he was having chest pain stating he accidentally took extra diltiazem and took his valsartan which had been discontinued during the previous admission.  In the ER the patient was normotensive although SBP optimal between 90 and 110.  Heart rates were in the 30s with underlying junctional rhythm.  Creatinine has doubled since discharge 2.1 to 4.76.  It is unknown how long the patient has been bradycardic.  He has been evaluated by cardiology who report current rhythm is accelerated junctional rhythm.  EKGs from previous admission demonstrate sinus bradycardia with prolonged PR interval and junctional beats and admission EKG during previous admission demonstrated junctional rhythm with a heart rate in the 40s.  Due to the severity of his acute kidney injury and low GFR his previous Eliquis has been  changed to IV heparin.   ED Course:  Vital Signs: BP (!) 149/56 (BP Location: Right Arm)   Pulse (!) 36   Temp 98.1 F (36.7 C) (Oral)   Resp 17   Ht 6\' 1"  (1.854 m)   Wt 86.7 kg (191 lb 1.6 oz)   SpO2 100%   BMI 25.21 kg/m  CXR: Patchy infiltrate left base with small left pleural effusion and stable apical pleural thickening Lumbar spine: Unchanged severe degenerative disc disease and grade 1 anterolisthesis at L5-S1 due to bilateral L5 pars defects Lab data: Sodium 131, potassium 4.6, chloride 97, CO2 20, glucose 99, BUN 40, creatinine 4.76, anion gap 14, LFTs not elevated, troponin 0.03, white count 11,600 with neutrophils 72% and absolute neutrophils 8.4%, hemoglobin 10.9, platelets 210,000 Medications and treatments: None    Subjective: 6/6 A/O x4, negative CP, negative S OB, negative abdominal pain.  Expressed concern that Dr. Jens Som cardiology has not been to see him (explained that they are all in 1 week group for now).   Assessment & Plan:   Principal Problem:   Acute kidney injury (HCC) Active Problems:   Back pain   Symptomatic bradycardia   Hypertension   COPD (chronic obstructive pulmonary disease) w/ bronchiectasis   History of CVA (cerebrovascular accident)-May 2019   Acute hyponatremia   Chronic kidney disease (CKD), stage IV (severe) (HCC)   Chronic diastolic heart failure (HCC)   HLD (hyperlipidemia)   Paroxysmal atrial flutter (HCC)  Acute on CKD stage IV (baseline Cr 2.1) - Acute renal failure most likely secondary to bradycardia--> hypotension - Per patient patient mistakenly doses of Diovan which had been discontinued  on previous admission.  Also plan to his acute renal failure. Recent Labs  Lab 06/11/17 1454 06/11/17 1517 06/12/17 0235  CREATININE 4.76* 4.70* 3.68*  -Continues to slowly improve but not at baseline . -Continue normal saline 100 ml/hr -Avoid renal toxic medication  - Renal ultrasound: Nondiagnostic for cause all acute renal  failure see results below.  -Unsafe for patient be on Eliquis with renal failure  Symptomatic Bradycardia/Paroxysmal atrial flutter -Cardiology on board -On background of sinus node dysfunction and first-degree AV block, treatment with diltiazem led to severe bradycardia.  Now back in normal rhythm.   -Avoid all negative chronotropic agents other than occasional "as needed" p.o. short acting diltiazem for paroxysmal atrial tachycardia.   -He will need a pacemaker at some point in the future, but it does not yet appear to be mandatory.  He remains asymptomatic. - P-AFib:  not formally documented per cardiology.  Automatic computer interpretation of atrial fibrillation on previous admission is not accurate.  However, he is at high risk for having atrial fibrillation due to his age and previous radiofrequency ablation procedure.  Due to his recent stroke to the middle cerebral artery, highly consistent with embolic etiology, I believe it is best to keep him on anticoagulation until we have a better understanding of his arrhythmia.  If atrial fibrillation is confirmed: CHA2DS2-VASc Score and unadjusted Ischemic Stroke Rate (% per year) is equal to 9.7 % stroke rate/year from a score of 6 (HTN, stroke, age, plaque).  On the other hand, if he does not truly have atrial fibrillation, the oral anticoagulant would expose him to a relatively high bleeding risk in view of his age and renal dysfunction. - 6/6 patient had multiple witnessed episodes by cardiology of cardiac pauses, therefore patient not safe for discharge.  Patient to see EP prior to discharge.  Pacer placement?.  Was seen by Dr. Elberta Fortis EP at time of this note, note had not been placed awaiting recommendations.  Chronic diastolic CHF -Echocardiogram 06/04/2017 EF 55 to 60%, grade 3 diastolic dysfunction.  Pulmonary hypertension 45 mmHg -Strict in and out since admission +870ml -Daily weight Filed Weights   06/11/17 1809 06/12/17 0601 06/13/17  0417  Weight: 191 lb 1.6 oz (86.7 kg) 189 lb (85.7 kg) 187 lb 6.4 oz (85 kg)  -Transfuse for hemoglobin<8  Pulmonary HTN -CHF   Essential HTN -Amlodipine 2.5 mg daily  COPD with Bronchiectasis -Stable   Acute hyponatremia - Most likely secondary to acute on CKD stage IV and hypoperfusion. - Serum osmolality normal: Urine osmolality/sodium pending -Not on diuretics - Resolved.      CVA (May 2019 -Residual right facial droop    -Continue heparin -Continue Lipitor  Back pain - Chronic severe DDD and grade 1 anterolisthesis at L5-S1  -Conservative management -Avoid NSAIDs secondary to acute on chronic renal failure - Avoid narcotics for chronic pain secondary to acute on chronic kidney failure   HLD -Lipitor 40 mg daily           DVT prophylaxis: Heparin per pharmacy Code Status: Full Family Communication: None Disposition Plan: Per cardiology   Consultants:  Cardiology     Procedures/Significant Events:  6/5 Renal ultrasound:-Negative hydronephrosis or focal renal abnormality.-Mildly enlarged prostate gland.     I have personally reviewed and interpreted all radiology studies and my findings are as above.  VENTILATOR SETTINGS:    Cultures   Antimicrobials:    Devices    LINES / TUBES:      Continuous Infusions: .  sodium chloride 100 mL/hr at 06/12/17 2139  . heparin 800 Units/hr (06/13/17 0517)     Objective: Vitals:   06/13/17 0001 06/13/17 0417 06/13/17 0502 06/13/17 0816  BP: (!) 163/80 (!) 171/86 (!) 154/68 129/84  Pulse: 75 78  91  Resp: 18 20    Temp: (!) 97.5 F (36.4 C) 97.7 F (36.5 C)  98.2 F (36.8 C)  TempSrc: Oral Oral  Oral  SpO2: 96% 100%  98%  Weight:  187 lb 6.4 oz (85 kg)    Height:        Intake/Output Summary (Last 24 hours) at 06/13/2017 0900 Last data filed at 06/13/2017 6578 Gross per 24 hour  Intake 3716.71 ml  Output 2275 ml  Net 1441.71 ml   Filed Weights   06/11/17 1809 06/12/17 0601  06/13/17 0417  Weight: 191 lb 1.6 oz (86.7 kg) 189 lb (85.7 kg) 187 lb 6.4 oz (85 kg)    Physical Exam:  General: A/O x4, No acute respiratory distress Neck:  Negative scars, masses, torticollis, lymphadenopathy, JVD Lungs: Clear to auscultation bilaterally without wheezes or crackles Cardiovascular: Regular rate and rhythm without murmur gallop or rub normal S1 and S2 Abdomen: negative abdominal pain, nondistended, positive soft, bowel sounds, no rebound, no ascites, no appreciable mass Extremities: No significant cyanosis, clubbing, or edema bilateral lower extremities Skin: Negative rashes, lesions, ulcers Psychiatric:  Negative depression, negative anxiety, negative fatigue, negative mania  Central nervous system:  Cranial nerves II through XII intact, tongue/uvula midline, all extremities muscle strength 5/5, sensation intact throughout,  negative dysarthria, negative expressive aphasia, negative receptive aphasia..     Data Reviewed: Care during the described time interval was provided by me .  I have reviewed this patient's available data, including medical history, events of note, physical examination, and all test results as part of my evaluation.   CBC: Recent Labs  Lab 06/11/17 1454 06/11/17 1517 06/12/17 0235 06/13/17 0354  WBC 11.6*  --  9.5 7.9  NEUTROABS 8.4*  --   --   --   HGB 10.9* 11.2* 10.5* 10.9*  HCT 34.8* 33.0* 33.0* 34.2*  MCV 88.5  --  86.6 87.7  PLT 210  --  181 202   Basic Metabolic Panel: Recent Labs  Lab 06/11/17 1454 06/11/17 1517 06/11/17 1838 06/12/17 0235  NA 131* 130*  --  134*  K 4.6 4.5  --  4.9  CL 97* 96*  --  103  CO2 20*  --   --  25  GLUCOSE 99 104*  --  100*  BUN 40* 38*  --  34*  CREATININE 4.76* 4.70*  --  3.68*  CALCIUM 8.8*  --   --  8.3*  MG  --   --  2.3  --    GFR: Estimated Creatinine Clearance: 16.9 mL/min (A) (by C-G formula based on SCr of 3.68 mg/dL (H)). Liver Function Tests: Recent Labs  Lab 06/11/17 1454    AST 26  ALT 15*  ALKPHOS 67  BILITOT 0.7  PROT 7.2  ALBUMIN 3.3*   No results for input(s): LIPASE, AMYLASE in the last 168 hours. No results for input(s): AMMONIA in the last 168 hours. Coagulation Profile: No results for input(s): INR, PROTIME in the last 168 hours. Cardiac Enzymes: Recent Labs  Lab 06/11/17 1454  TROPONINI 0.03*   BNP (last 3 results) Recent Labs    04/24/17 1450  PROBNP 246.0*   HbA1C: No results for input(s): HGBA1C in the last 72  hours. CBG: No results for input(s): GLUCAP in the last 168 hours. Lipid Profile: No results for input(s): CHOL, HDL, LDLCALC, TRIG, CHOLHDL, LDLDIRECT in the last 72 hours. Thyroid Function Tests: Recent Labs    06/11/17 1838  TSH 5.393*   Anemia Panel: No results for input(s): VITAMINB12, FOLATE, FERRITIN, TIBC, IRON, RETICCTPCT in the last 72 hours. Urine analysis:    Component Value Date/Time   COLORURINE YELLOW 06/11/2017 2325   APPEARANCEUR CLEAR 06/11/2017 2325   LABSPEC 1.005 06/11/2017 2325   PHURINE 6.0 06/11/2017 2325   GLUCOSEU NEGATIVE 06/11/2017 2325   GLUCOSEU NEGATIVE 04/24/2016 0728   HGBUR NEGATIVE 06/11/2017 2325   BILIRUBINUR NEGATIVE 06/11/2017 2325   BILIRUBINUR negative 05/31/2015 0910   BILIRUBINUR Negative 04/28/2014 1516   KETONESUR NEGATIVE 06/11/2017 2325   PROTEINUR NEGATIVE 06/11/2017 2325   UROBILINOGEN 0.2 04/24/2016 0728   NITRITE NEGATIVE 06/11/2017 2325   LEUKOCYTESUR NEGATIVE 06/11/2017 2325   Sepsis Labs: @LABRCNTIP (procalcitonin:4,lacticidven:4)  ) Recent Results (from the past 240 hour(s))  MRSA PCR Screening     Status: None   Collection Time: 06/11/17  6:20 PM  Result Value Ref Range Status   MRSA by PCR NEGATIVE NEGATIVE Final    Comment:        The GeneXpert MRSA Assay (FDA approved for NASAL specimens only), is one component of a comprehensive MRSA colonization surveillance program. It is not intended to diagnose MRSA infection nor to guide or monitor  treatment for MRSA infections. Performed at [facility], [address] [place], [place] [address]          Radiology Studies: Dg Lumbar Spine 2-3 Views  Result Date: 06/11/2017 CLINICAL DATA:  Chronic back pain. EXAM: LUMBAR SPINE - 2-3 VIEW COMPARISON:  CT abdomen pelvis dated November 22, 2015. FINDINGS: Five lumbar type vertebral bodies. No acute fracture or subluxation. Vertebral body heights are preserved. Unchanged 8-9 mm anterolisthesis at L5-S1 due to bilateral L5 pars defects. Severe disc height loss at L5-S1, also unchanged. Remaining intervertebral disc heights are preserved. Aortic atherosclerosis. IMPRESSION: 1. Unchanged severe degenerative disc disease and grade 1 anterolisthesis at L5-S1 due to bilateral L5 pars defects. Electronically Signed   By: Obie Dredge M.D.   On: 06/11/2017 17:25   US Renal  Result Date: 06/12/2017 CLINICAL DATA:  Acute kidney injury EXAM: RENAL / URINARY TRACT ULTRASOUND COMPLETE COMPARISON:  None. FINDINGS: Right Kidney: Length: 9.6 cm. Echogenicity within normal limits. No mass or hydronephrosis visualized. Left Kidney: Length: 10.4 cm. Echogenicity within normal limits. No mass or hydronephrosis visualized. Small renal cyst measures 9 x 6 x 7 mm. Bladder: Appears normal for degree of bladder distention. Prostate: Prostate is mildly enlarged measuring 3.2 x 3.0 x 5.2 cm. IMPRESSION: 1. No hydronephrosis or other focal renal abnormality. 2. Mildly enlarged prostate gland. Electronically Signed   By: Deatra Robinson M.D.   On: 06/12/2017 06:41   Dg Chest Portable 1 View  Result Date: 06/11/2017 CLINICAL DATA:  Chest pain and bradycardia EXAM: PORTABLE CHEST 1 VIEW COMPARISON:  Jun 03, 2017 FINDINGS: There is patchy infiltrate in the left base with small left pleural effusion. There is stable apical pleural thickening bilaterally. Lungs elsewhere are clear. Heart is mildly enlarged with pulmonary vascularity normal. No adenopathy. There is aortic  atherosclerosis. There is calcification in each carotid artery. No bony lesions evident. IMPRESSION: Patchy infiltrate left base with small left pleural effusion. Stable apical pleural thickening. Stable cardiomegaly. There is aortic atherosclerosis as well as extensive visualized  carotid artery calcification bilaterally. Aortic Atherosclerosis (ICD10-I70.0). Electronically Signed   By: Bretta Bang III M.D.   On: 06/11/2017 15:39        Scheduled Meds: . atorvastatin  40 mg Oral q1800  . clidinium-chlordiazePOXIDE  1 capsule Oral BID  . famotidine  20 mg Oral QHS  . fluticasone  2 spray Each Nare QHS  . gabapentin  300 mg Oral TID  . lidocaine  1 patch Transdermal Q24H  . pantoprazole  40 mg Oral Daily  . senna-docusate  1 tablet Oral Daily  . sodium chloride flush  3 mL Intravenous Q12H  . vitamin E  100 Units Oral Daily   Continuous Infusions: . sodium chloride 100 mL/hr at 06/12/17 2139  . heparin 800 Units/hr (06/13/17 0517)     LOS: 2 days    Time spent: 40 minutes    Aleecia Tapia, Roselind Messier, MD [facility] Pager [phone]   If 7PM-7AM, please contact night-coverage [email or web address] Password [email or web address] 06/13/2017, 9:00 AM

## 2017-06-13 NOTE — Progress Notes (Addendum)
[place] for Heparin (Apixaban on hold) Indication: atrial fibrillation, recent CVA  Allergies  Allergen Reactions  . Dyazide [Hydrochlorothiazide W-Triamterene] Other (See Comments)    Caused hyponatremia 04/2012  . Lisinopril Other (See Comments)    cough    Patient Measurements: Height: 6\' 1"  (185.4 cm) Weight: 187 lb 6.4 oz (85 kg) IBW/kg (Calculated) : 79.9 Heparin Dosing Weight: 86.6 kg  Vital Signs: Temp: 97.7 F (36.5 C) (06/06 1256) Temp Source: Oral (06/06 1256) BP: 156/79 (06/06 1256) Pulse Rate: 64 (06/06 1256)  Labs: Recent Labs    06/11/17 1454 06/11/17 1517  06/11/17 1838 06/12/17 0235 06/12/17 1208 06/13/17 0354 06/13/17 0912 06/13/17 1407  HGB 10.9* 11.2*  --   --  10.5*  --  10.9*  --   --   HCT 34.8* 33.0*  --   --  33.0*  --  34.2*  --   --   PLT 210  --   --   --  181  --  202  --   --   APTT  --   --    < > 51* 175* 95* 136*  --  77*  HEPARINUNFRC  --   --   --  >2.20* >2.20*  --  1.70*  --   --   CREATININE 4.76* 4.70*  --   --  3.68*  --   --  1.97*  --   TROPONINI 0.03*  --   --   --   --   --   --   --   --    < > = values in this interval not displayed.    Estimated Creatinine Clearance: 31.5 mL/min (A) (by C-G formula based on SCr of 1.97 mg/dL (H)).   Medical History: Past Medical History:  Diagnosis Date  . Abnormal EKG    ST changes with normal coronaries by cath 05/06/12.  . Arthritis    "hands" (01/12/2013); feet (podiatry consultation); "back" (06/12/2017)  . Atrial flutter ([place])    a. Slow ~100bpm when in 2:1; dx 04/2012. b. Placed on Xarelto.  c. s/p ablation 07-08-2012 by Dr Rayann Heman  . Atrial tachycardia ([place])   . Carotid artery calcification    By CXR - dopplers 05/06/12 without obvious evidence of significant ICA stenosis >40%  . Chronic lower back pain   . Colon polyps 06/26/2005  . COPD (chronic obstructive pulmonary disease) ([place])   . Diverticulosis of colon (without mention of  hemorrhage)   . Dysrhythmia   . Gastritis, chronic    Pt denies bleeding 04/2012. EGD 2012 reportedly normal.  . GERD (gastroesophageal reflux disease)   . Heart murmur   . Hypertension   . Hyponatremia    04/2012 r/t diuretic.  . IBS (irritable bowel syndrome)   . Internal and external hemorrhoids without complication   . Macular degeneration of left eye   . Pneumonia    "@ least 5 times" (06/12/2017)  . Shortness of breath dyspnea    walking distances  . Tongue cancer ([place]) 02/1997    Assessment: 73 yoM admitted with bradycardia. Pt on Eliquis PTA for AFib to transition to heparin per pharmacy while admitted with concern for acute kidney injury.  Last dose of apixaban was 6/4 at 0630 per pt.  aPTT now therapeutic on 800 units/hr of heparin.  Will recheck in 6 hours for confirmation.  Goal of Therapy:  Heparin level 0.3-0.5 units/mL APTT 66-84 secs  Monitor platelets by  anticoagulation protocol: Yes   Plan:  Continue heparin at 800 units/hr 2000 aPTT Daily CBC/aPTT/HL  Monitor for bleeding  [employer], Pharm.D., BCPS Clinical Pharmacist 06/13/2017 3:10 PM    Addendum:  aPTT done for confirmation is now supra-therapeutic Will lower heparin rate to 700 units/hr to keep within aPTT goal of 66-84 sec.  [employer], Pharm.D., BCPS Clinical Pharmacist 06/13/2017 9:11 PM

## 2017-06-13 NOTE — Progress Notes (Signed)
RN notified of 3.63 sec pause by CMT.  Dr. Sallyanne Kuster at desk, shown telemetry strips with pauses.  Strips saved to chart.  Patient is asymptomatic BP=124/70.  Hold off on norvasc per Dr Sallyanne Kuster.

## 2017-06-13 NOTE — Care Management Note (Signed)
Case Management Note  Patient Details  Name: Chad [name] MRN: [id number] Date of Birth: 11/16/1931  Subjective/Objective:   Pt presented for Bradycardia. PTA from home with support of his brother n law. Pt states he was active with [facility] for PT. Pt expressed some confusion with medication management. Pt could benefit from [facility] RN once stable to transition home.                  Action/Plan: CM will continue to monitor. If the plan continues to be home-Pt will need [facility] RN, PT orders and F2F.    Expected Discharge Date:                  Expected Discharge Plan:  [place]  In-House Referral:  NA  Discharge planning Services  CM Consult  Post Acute Care Choice:  Home Health, Resumption of Svcs/PTA Provider Choice offered to:  Patient  DME Arranged:  N/A DME Agency:  NA  HH Arranged:  RN, Disease Management, PT [place] Agency:  [facility]  Status of Service:  In process, will continue to follow  If discussed at Long Length of Stay Meetings, dates discussed:    Additional Comments:  Bethena Roys, RN 06/13/2017, 2:01 PM

## 2017-06-13 NOTE — Consult Note (Addendum)
ELECTROPHYSIOLOGY CONSULT NOTE    Patient ID: ZYIAN BROKENSHIRE MRN: [id number], DOB/AGE: Mar 23, 1931 84 y.o.  Admit date: 06/11/2017 Date of Consult: 06/13/2017  Primary Physician: Tresa Garter, MD Primary Cardiologist: Jens Som Electrophysiologist: Elberta Fortis (new this admission)  Patient Profile: JONAEL DARLEY is a 84 y.o. male with a history of atrial flutter, atrial tachycardia and recent stroke who is being seen today for the evaluation of sinus pauses at the request of Dr Royann Shivers.  HPI:  SHEHAB BROWNE is a 84 y.o. male with the above past medical history. He was admitted after his home health nurse found his heart rate to be in the 30's.  On presentation to the ER, he was found to be in junctional rhythm in the 30's.  On review of meds, he was started on Diltiazem during last admission for concern for AF.  Instead of taking once daily, he had been taking twice daily.  His Diltiazem was allowed to wash out and rates improved.  Today however, he developed sinus pauses of up to 4.5 seconds that were asymptomatic. EP has been asked to evaluate for treatment options.  He currently feels well and wants to go home.  He denies chest pain, palpitations (above baseline), dyspnea, PND, orthopnea, nausea, vomiting, dizziness, syncope, edema, weight gain, or early satiety.  Past Medical History:  Diagnosis Date  . Abnormal EKG    ST changes with normal coronaries by cath 05/06/12.  . Arthritis    "hands" (01/12/2013); feet (podiatry consultation); "back" (06/12/2017)  . Atrial flutter (HCC)    a. Slow ~100bpm when in 2:1; dx 04/2012. b. Placed on Xarelto.  c. s/p ablation 07-08-2012 by Dr Johney Frame  . Atrial tachycardia (HCC)   . Carotid artery calcification    By CXR - dopplers 05/06/12 without obvious evidence of significant ICA stenosis >40%  . Chronic lower back pain   . Colon polyps 06/26/2005  . COPD (chronic obstructive pulmonary disease) (HCC)   . Diverticulosis of colon (without mention  of hemorrhage)   . Dysrhythmia   . Gastritis, chronic    Pt denies bleeding 04/2012. EGD 2012 reportedly normal.  . GERD (gastroesophageal reflux disease)   . Heart murmur   . Hypertension   . Hyponatremia    04/2012 r/t diuretic.  . IBS (irritable bowel syndrome)   . Internal and external hemorrhoids without complication   . Macular degeneration of left eye   . Pneumonia    "@ least 5 times" (06/12/2017)  . Shortness of breath dyspnea    walking distances  . Tongue cancer (HCC) 02/1997     Surgical History:  Past Surgical History:  Procedure Laterality Date  . ATRIAL FLUTTER ABLATION N/A 07/08/2012   Procedure: ATRIAL FLUTTER ABLATION;  Surgeon: Hillis Range, MD;  Location: [facility] CATH LAB;  Service: Cardiovascular;  Laterality: N/A;  . CARDIAC CATHETERIZATION  04/2012  . CATARACT EXTRACTION W/ INTRAOCULAR LENS IMPLANT Left   . COLONOSCOPY  08/29/10   diverticulosis, internal hemorrhoids  . ESOPHAGOGASTRODUODENOSCOPY  09/20/10   normal  . HEMORRHOID SURGERY  1968  . LEFT HEART CATHETERIZATION WITH CORONARY ANGIOGRAM N/A 05/06/2012   Procedure: LEFT HEART CATHETERIZATION WITH CORONARY ANGIOGRAM;  Surgeon: Peter M Swaziland, MD;  Location: [facility] CATH LAB;  Service: Cardiovascular;  Laterality: N/A;  . 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 TRIGGER FINGER RELEASE Right 1970's   "2 fingers"  . WART FULGURATION Left 09/15/2015   Procedure: EXCISIONal biospy of left peri anual  and anual canal mass;  Surgeon: Gaynelle Adu, MD;  Location: [facility] ORS;  Service: General;  Laterality: Left;     Medications Prior to Admission  Medication Sig Dispense Refill Last Dose  . apixaban (ELIQUIS) 2.5 MG TABS tablet Take 1 tablet (2.5 mg total) by mouth 2 (two) times daily. 60 tablet 1 06/11/2017 at 0600  . atorvastatin (LIPITOR) 40 MG tablet Take 1 tablet (40 mg total) by mouth daily at 6 PM. 30 tablet 1 06/10/2017 at 1800  . cholecalciferol (VITAMIN D) 1000 units tablet Take 1 tablet (1,000 Units total)  by mouth daily. 100 tablet 3 06/11/2017 at 0600  . clidinium-chlordiazePOXIDE (LIBRAX) 5-2.5 MG capsule TAKE 1 CAPSULE BY MOUTH TWICE DAILY 60 capsule 3 06/11/2017 at 0600  . cyanocobalamin 1000 MCG tablet Take 1,000 mcg by mouth daily.    06/11/2017 at 0600  . diltiazem (CARDIZEM CD) 120 MG 24 hr capsule Take 1 capsule (120 mg total) by mouth at bedtime. 90 capsule 3 06/10/2017 at 2000  . famotidine (PEPCID) 20 MG tablet One at bedtime (Patient taking differently: Take 20 mg by mouth at bedtime. ) 30 tablet 11 06/10/2017 at 20000  . fluticasone (FLONASE) 50 MCG/ACT nasal spray Place 2 sprays into both nostrils daily. (Patient taking differently: Place 2 sprays into both nostrils at bedtime. ) 48 g 3 06/10/2017 at 2000  . gabapentin (NEURONTIN) 300 MG capsule Take 3 capsules at bedtime. (Patient taking differently: Take 300 mg by mouth 3 (three) times daily. ) 270 capsule 3 06/11/2017 at 0600  . multivitamin-lutein (OCUVITE-LUTEIN) CAPS capsule Take 1 capsule by mouth daily.    06/11/2017 at 0600  . pantoprazole (PROTONIX) 40 MG tablet Take 1 tablet (40 mg total) by mouth daily. Take 30-60 min before first meal of the day 30 tablet 2 06/11/2017 at 0600  . polyethylene glycol powder (GLYCOLAX/MIRALAX) powder Take 17 g by mouth 2 (two) times daily as needed. 3350 g 1 unknown  . senna-docusate (SENOKOT-S) 8.6-50 MG tablet Take 1 tablet by mouth daily.   06/11/2017 at 0600  . vitamin E 100 UNIT capsule Take 100 Units by mouth daily.   06/11/2017 at 0600    Inpatient Medications:  . amLODipine  2.5 mg Oral Daily  . atorvastatin  40 mg Oral q1800  . clidinium-chlordiazePOXIDE  1 capsule Oral BID  . famotidine  20 mg Oral QHS  . fluticasone  2 spray Each Nare QHS  . gabapentin  300 mg Oral TID  . lidocaine  1 patch Transdermal Q24H  . pantoprazole  40 mg Oral Daily  . senna-docusate  1 tablet Oral Daily  . sodium chloride flush  3 mL Intravenous Q12H  . vitamin E  100 Units Oral Daily    Allergies:  Allergies    Allergen Reactions  . Dyazide [Hydrochlorothiazide W-Triamterene] Other (See Comments)    Caused hyponatremia 04/2012  . Lisinopril Other (See Comments)    cough    Social History   Socioeconomic History  . Marital status: Divorced    Spouse name: Not on file  . Number of children: 2  . Years of education: 17  . Highest education level: Not on file  Occupational History  . Occupation: Retired  Engineer, production  . Financial resource strain: Not on file  . Food insecurity:    Worry: Not on file    Inability: Not on file  . Transportation needs:    Medical: Not on file    Non-medical: Not on file  Tobacco Use  . Smoking status: Former Smoker    Packs/day: 0.50    Years: 20.00    Pack years: 10.00    Types: Cigarettes, Pipe, Cigars    Last attempt to quit: 12/08/1996    Years since quitting: 20.5  . Smokeless tobacco: Never Used  . Tobacco comment:    Substance and Sexual Activity  . Alcohol use: No    Alcohol/week: 0.0 oz    Frequency: Never    Comment: 06/12/2017"did drink 2-3 12 oz beers a day; stopped drinking in 04/2012"  . Drug use: Never  . Sexual activity: Not Currently  Lifestyle  . Physical activity:    Days per week: Not on file    Minutes per session: Not on file  . Stress: Not on file  Relationships  . Social connections:    Talks on phone: Not on file    Gets together: Not on file    Attends religious service: Not on file    Active member of club or organization: Not on file    Attends meetings of clubs or organizations: Not on file    Relationship status: Not on file  . Intimate partner violence:    Fear of current or ex partner: Not on file    Emotionally abused: Not on file    Physically abused: Not on file    Forced sexual activity: Not on file  Other Topics Concern  . Not on file  Social History Narrative   Marital status: divorced; lives with ex-wife; not dating in 2017.      Children:  2 sons, 2 grandsons, 1 granddaughter; no gg.      Lives:  with ex-wife in house.      Employment: retired first time age 51; retired in 1997.  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      Tobacco: quit smoking 1997; smoked for 27 years.      Alcohol: quit 2004      Exercise:  Walking daily short distances.       ADLs:  NO assistant devices; has cane if needs it; drives; pays bill; grocery shopping by wife; wife cleans house and does laundry.     Advanced Directives:  +LIVING [place]; +FULL CODE.  HCPOA: oldest son Crowley Krasner Sr.)     Family History  Problem Relation Age of Onset  . Heart disease Brother   . Throat cancer Brother   . Other Mother        UNSURE  . Other Father        UNSURE  . Emphysema Sister   . Emphysema Sister   . Other Brother        UNSURE  . Diabetes Unknown      Review of Systems: All other systems reviewed and are otherwise negative except as noted above.  Physical Exam: Vitals:   06/13/17 0816 06/13/17 0947 06/13/17 1256 06/13/17 1710  BP: 129/84 124/70 (!) 156/79 (!) 160/83  Pulse: 91  64 75  Resp:      Temp: 98.2 F (36.8 C)  97.7 F (36.5 C) 97.7 F (36.5 C)  TempSrc: Oral  Oral Oral  SpO2: 98%  100% 98%  Weight:      Height:        GEN- The patient is elderly appearing, alert and oriented x 3 today.   HEENT: normocephalic, atraumatic; sclera clear, conjunctiva pink; hearing intact; oropharynx clear; neck supple Lungs- Clear to ausculation bilaterally, normal work of  breathing.  No wheezes, rales, rhonchi Heart- Regular rate and rhythm GI- soft, non-tender, non-distended, bowel sounds present Extremities- no clubbing, cyanosis, or edema; DP/PT/radial pulses 2+ bilaterally MS- no significant deformity or atrophy Skin- warm and dry, no rash or lesion Psych- euthymic mood, full affect Neuro- strength and sensation are intact  Labs:   Lab Results  Component Value Date   WBC 7.9 06/13/2017   HGB 10.9 (L) 06/13/2017   HCT 34.2 (L) 06/13/2017   MCV 87.7 06/13/2017   PLT 202  06/13/2017    Recent Labs  Lab 06/11/17 1454  06/13/17 0912  NA 131*   < > 137  K 4.6   < > 4.8  CL 97*   < > 107  CO2 20*   < > 24  BUN 40*   < > 19  CREATININE 4.76*   < > 1.97*  CALCIUM 8.8*   < > 8.2*  PROT 7.2  --   --   BILITOT 0.7  --   --   ALKPHOS 67  --   --   ALT 15*  --   --   AST 26  --   --   GLUCOSE 99   < > 107*   < > = values in this interval not displayed.      Radiology/Studies: Dg Chest 2 View  Result Date: 06/03/2017 CLINICAL DATA:  Transient ischemic attack. History of cardiac arrhythmia EXAM: CHEST - 2 VIEW COMPARISON:  None. FINDINGS: There is bibasilar fibrotic change with mild lower lobe bronchiectatic change. No edema or consolidation. Heart is mildly enlarged with pulmonary vascularity normal. No adenopathy. There is aortic atherosclerosis. No evident bone lesions. There is calcification in both carotid arteries. IMPRESSION: Bibasilar fibrosis and bronchiectasis. No frank edema or consolidation. Stable cardiac prominence with pulmonary vascularity normal. There is aortic atherosclerosis as well as extensive carotid artery calcification bilaterally. Aortic Atherosclerosis (ICD10-I70.0). Electronically Signed   By: Bretta Bang III M.D.   On: 06/03/2017 15:25   Dg Lumbar Spine 2-3 Views  Result Date: 06/11/2017 CLINICAL DATA:  Chronic back pain. EXAM: LUMBAR SPINE - 2-3 VIEW COMPARISON:  CT abdomen pelvis dated November 22, 2015. FINDINGS: Five lumbar type vertebral bodies. No acute fracture or subluxation. Vertebral body heights are preserved. Unchanged 8-9 mm anterolisthesis at L5-S1 due to bilateral L5 pars defects. Severe disc height loss at L5-S1, also unchanged. Remaining intervertebral disc heights are preserved. Aortic atherosclerosis. IMPRESSION: 1. Unchanged severe degenerative disc disease and grade 1 anterolisthesis at L5-S1 due to bilateral L5 pars defects. Electronically Signed   By: Obie Dredge M.D.   On: 06/11/2017 17:25   Ct Head Wo  Contrast  Result Date: 06/03/2017 CLINICAL DATA:  Left upper extremity weakness EXAM: CT HEAD WITHOUT CONTRAST TECHNIQUE: Contiguous axial images were obtained from the base of the skull through the vertex without intravenous contrast. COMPARISON:  None. FINDINGS: Brain: There is mild diffuse atrophy. There is no intracranial mass, hemorrhage, extra-axial fluid collection, or midline shift. There is evidence of a prior small infarct in medial superior right frontal lobe. There is patchy small vessel disease in the centra semiovale bilaterally. Elsewhere gray-white compartments appear normal. Vascular: No hyperdense vessels are evident. There is calcification each carotid siphon region and distal vertebral artery. Skull: The bony calvarium appears intact. Sinuses/Orbits: There is mucosal thickening in multiple ethmoid air cells with opacification of anterior ethmoid air cells on the left. Other visualized paranasal sinuses are clear. There is an old healed fracture  of the right nasal bone. Orbits appear symmetric bilaterally except for evidence of previous cataract resection on the left. Other: Visualized mastoid air cells are clear. IMPRESSION: Atrophy with periventricular small vessel disease. Prior infarct medial superior right frontal lobe. No acute infarct demonstrable. No mass or hemorrhage evident. Foci arterial vascular calcification. Areas of paranasal sinus disease. Old healed fracture right nasal bone. Electronically Signed   By: Bretta Bang III M.D.   On: 06/03/2017 13:16   Mr Brain Wo Contrast  Result Date: 06/03/2017 CLINICAL DATA:  Acute onset of LEFT arm weakness. EXAM: MRI HEAD WITHOUT CONTRAST MRA HEAD WITHOUT CONTRAST TECHNIQUE: Multiplanar, multiecho pulse sequences of the brain and surrounding structures were obtained without intravenous contrast. Angiographic images of the head were obtained using MRA technique without contrast. COMPARISON:  CT head earlier today. FINDINGS: MRI HEAD  FINDINGS Brain: Small foci of restricted diffusion affect the RIGHT posterior frontal cortex and subcortical white matter consistent with acute infarction. No similar lesions elsewhere. No hemorrhage, mass lesion, or extra-axial fluid. Generalized atrophy. Hydrocephalus ex vacuo. Mild subcortical and periventricular T2 and FLAIR hyperintensities, likely chronic microvascular ischemic change. Old RIGHT ACA infarct affects the medial frontal cortex. Vascular: Normal flow voids. Skull and upper cervical spine: Normal marrow signal. Sinuses/Orbits: Negative. Other: None. MRA HEAD FINDINGS The internal carotid arteries are widely patent. The basilar artery is widely patent with vertebrals codominant. RIGHT A1 ACA widely patent. The distal anterior cerebral arteries are fed exclusively from the RIGHT A1 ACA. The LEFT A1 ACA is diseased or hypoplastic. Both middle cerebral arteries are widely patent. No MCA disease of significance. Both posterior cerebral arteries are widely patent. No cerebellar branch occlusion. No saccular aneurysm. IMPRESSION: Acute RIGHT MCA territory infarct, nonhemorrhagic, affecting the precentral posterior frontal cortex and subcortical white matter. The location would correlate with symptoms of LEFT arm weakness. Atrophy and small vessel disease.  Old RIGHT ACA infarct. MRA demonstrating no ICA or MCA abnormality of significance. Severely diseased hypoplastic LEFT A1 ACA, incidental finding. Electronically Signed   By: Elsie Stain M.D.   On: 06/03/2017 19:57   US Renal  Result Date: 06/12/2017 CLINICAL DATA:  Acute kidney injury EXAM: RENAL / URINARY TRACT ULTRASOUND COMPLETE COMPARISON:  None. FINDINGS: Right Kidney: Length: 9.6 cm. Echogenicity within normal limits. No mass or hydronephrosis visualized. Left Kidney: Length: 10.4 cm. Echogenicity within normal limits. No mass or hydronephrosis visualized. Small renal cyst measures 9 x 6 x 7 mm. Bladder: Appears normal for degree of bladder  distention. Prostate: Prostate is mildly enlarged measuring 3.2 x 3.0 x 5.2 cm. IMPRESSION: 1. No hydronephrosis or other focal renal abnormality. 2. Mildly enlarged prostate gland. Electronically Signed   By: Deatra Robinson M.D.   On: 06/12/2017 06:41   Dg Chest Portable 1 View  Result Date: 06/11/2017 CLINICAL DATA:  Chest pain and bradycardia EXAM: PORTABLE CHEST 1 VIEW COMPARISON:  Jun 03, 2017 FINDINGS: There is patchy infiltrate in the left base with small left pleural effusion. There is stable apical pleural thickening bilaterally. Lungs elsewhere are clear. Heart is mildly enlarged with pulmonary vascularity normal. No adenopathy. There is aortic atherosclerosis. There is calcification in each carotid artery. No bony lesions evident. IMPRESSION: Patchy infiltrate left base with small left pleural effusion. Stable apical pleural thickening. Stable cardiomegaly. There is aortic atherosclerosis as well as extensive visualized carotid artery calcification bilaterally. Aortic Atherosclerosis (ICD10-I70.0). Electronically Signed   By: Bretta Bang III M.D.   On: 06/11/2017 15:39   Mr Maxine Glenn  Head Wo Contrast  Result Date: 06/03/2017 CLINICAL DATA:  Acute onset of LEFT arm weakness. EXAM: MRI HEAD WITHOUT CONTRAST MRA HEAD WITHOUT CONTRAST TECHNIQUE: Multiplanar, multiecho pulse sequences of the brain and surrounding structures were obtained without intravenous contrast. Angiographic images of the head were obtained using MRA technique without contrast. COMPARISON:  CT head earlier today. FINDINGS: MRI HEAD FINDINGS Brain: Small foci of restricted diffusion affect the RIGHT posterior frontal cortex and subcortical white matter consistent with acute infarction. No similar lesions elsewhere. No hemorrhage, mass lesion, or extra-axial fluid. Generalized atrophy. Hydrocephalus ex vacuo. Mild subcortical and periventricular T2 and FLAIR hyperintensities, likely chronic microvascular ischemic change. Old RIGHT ACA  infarct affects the medial frontal cortex. Vascular: Normal flow voids. Skull and upper cervical spine: Normal marrow signal. Sinuses/Orbits: Negative. Other: None. MRA HEAD FINDINGS The internal carotid arteries are widely patent. The basilar artery is widely patent with vertebrals codominant. RIGHT A1 ACA widely patent. The distal anterior cerebral arteries are fed exclusively from the RIGHT A1 ACA. The LEFT A1 ACA is diseased or hypoplastic. Both middle cerebral arteries are widely patent. No MCA disease of significance. Both posterior cerebral arteries are widely patent. No cerebellar branch occlusion. No saccular aneurysm. IMPRESSION: Acute RIGHT MCA territory infarct, nonhemorrhagic, affecting the precentral posterior frontal cortex and subcortical white matter. The location would correlate with symptoms of LEFT arm weakness. Atrophy and small vessel disease.  Old RIGHT ACA infarct. MRA demonstrating no ICA or MCA abnormality of significance. Severely diseased hypoplastic LEFT A1 ACA, incidental finding. Electronically Signed   By: Elsie Stain M.D.   On: 06/03/2017 19:57    BJY:[id number] rhythm, LBBB (personally reviewed)  TELEMETRY: SR, up to 4.5 second sinus pauses (personally reviewed)  Assessment/Plan: 1.  Sinus node dysfunction The patient has sinus node dysfunction and baseline conduction system disease with 1st degree AV block.  On presentation, he was taking diltiazem twice daily which has been held. His heart rates initially improved but he then developed sinus pauses which have been asymptomatic.  Renal function is improving.  As he is asymptomatic, would recommend continued monitoring for now.  He would like to avoid pacemaker if able.  If he has symptomatic pauses, Sherita Decoste need to consider pacing.   2.  ?Atrial fibrillation Diagnosed at time of recent stroke EKG's that were read as AF are not AF Could consider 30 day monitor as an outpatient.   3.  AKI Improving      Signed, Gypsy Balsam, NP 06/13/2017 6:57 PM    I have seen and examined this patient with Gypsy Balsam.  Agree with above, note added to reflect my findings.  On exam, RRR, no murmurs, lungs clear. Presented to the hospital with junctional rhythm. Diltiazem held with resolution of junctional rhythm. Had sinus pauses but was asymptomatic. Would continue to monitor for further pauses. Would prefer to avoid pacemaker.    Judyth Demarais M. Kesha Hurrell MD 06/13/2017 9:36 PM

## 2017-06-13 NOTE — Progress Notes (Addendum)
Progress Note  Patient Name: Chad [name] Date of Encounter: 06/13/2017  Primary Cardiologist: Kirk Ruths, MD   Subjective   He feels well and has not had any further problems with bradycardia on telemetry.  Maintains excellent urine output.  Labs pending.  Inpatient Medications    Scheduled Meds: . atorvastatin  40 mg Oral q1800  . clidinium-chlordiazePOXIDE  1 capsule Oral BID  . famotidine  20 mg Oral QHS  . fluticasone  2 spray Each Nare QHS  . gabapentin  300 mg Oral TID  . lidocaine  1 patch Transdermal Q24H  . pantoprazole  40 mg Oral Daily  . senna-docusate  1 tablet Oral Daily  . sodium chloride flush  3 mL Intravenous Q12H  . vitamin E  100 Units Oral Daily   Continuous Infusions: . sodium chloride 100 mL/hr at 06/12/17 2139  . heparin 800 Units/hr (06/13/17 0517)   PRN Meds: acetaminophen **OR** acetaminophen, ondansetron **OR** ondansetron (ZOFRAN) IV   Vital Signs    Vitals:   06/13/17 0001 06/13/17 0417 06/13/17 0502 06/13/17 0816  BP: (!) 163/80 (!) 171/86 (!) 154/68 129/84  Pulse: 75 78  91  Resp: 18 20    Temp: (!) 97.5 F (36.4 C) 97.7 F (36.5 C)  98.2 F (36.8 C)  TempSrc: Oral Oral  Oral  SpO2: 96% 100%  98%  Weight:  187 lb 6.4 oz (85 kg)    Height:        Intake/Output Summary (Last 24 hours) at 06/13/2017 0940 Last data filed at 06/13/2017 3664 Gross per 24 hour  Intake 3716.71 ml  Output 2275 ml  Net 1441.71 ml   Filed Weights   06/11/17 1809 06/12/17 0601 06/13/17 0417  Weight: 191 lb 1.6 oz (86.7 kg) 189 lb (85.7 kg) 187 lb 6.4 oz (85 kg)    Telemetry    Sinus rhythm with very long first-degree AV block; when he developed sinus tachycardia this mimics junctional or paroxysmal atrial tachycardia, but the increase and decrease in heart rate is gradual consistent with sinus mechanism.- Personally Reviewed  ECG    No new tracing- Personally Reviewed  Physical Exam  Looks very comfortable GEN: No acute distress.     Neck: No JVD; prominent cervical collateral vein formation Cardiac: RRR, very harsh holosystolic murmur LLSB, probably separate AS murmur, early peaking at RUSB, no diastolic murmurs, rubs, or gallops.  Respiratory: Clear to auscultation bilaterally. GI: Soft, nontender, non-distended  MS: No edema; No deformity. Neuro:  Nonfocal  Psych: Normal affect   Labs    Chemistry Recent Labs  Lab 06/11/17 1454 06/11/17 1517 06/12/17 0235  NA 131* 130* 134*  K 4.6 4.5 4.9  CL 97* 96* 103  CO2 20*  --  25  GLUCOSE 99 104* 100*  BUN 40* 38* 34*  CREATININE 4.76* 4.70* 3.68*  CALCIUM 8.8*  --  8.3*  PROT 7.2  --   --   ALBUMIN 3.3*  --   --   AST 26  --   --   ALT 15*  --   --   ALKPHOS 67  --   --   BILITOT 0.7  --   --   GFRNONAA 10*  --  14*  GFRAA 12*  --  16*  ANIONGAP 14  --  6     Hematology Recent Labs  Lab 06/11/17 1454 06/11/17 1517 06/12/17 0235 06/13/17 0354  WBC 11.6*  --  9.5 7.9  RBC 3.93*  --  3.81* 3.90*  HGB 10.9* 11.2* 10.5* 10.9*  HCT 34.8* 33.0* 33.0* 34.2*  MCV 88.5  --  86.6 87.7  MCH 27.7  --  27.6 27.9  MCHC 31.3  --  31.8 31.9  RDW 15.0  --  14.7 14.8  PLT 210  --  181 202    Cardiac Enzymes Recent Labs  Lab 06/11/17 1454  TROPONINI 0.03*   No results for input(s): TROPIPOC in the last 168 hours.   BNP Recent Labs  Lab 06/12/17 0235  BNP 1,385.5*     DDimer No results for input(s): DDIMER in the last 168 hours.   Radiology    Dg Lumbar Spine 2-3 Views  Result Date: 06/11/2017 CLINICAL DATA:  Chronic back pain. EXAM: LUMBAR SPINE - 2-3 VIEW COMPARISON:  CT abdomen pelvis dated November 22, 2015. FINDINGS: Five lumbar type vertebral bodies. No acute fracture or subluxation. Vertebral body heights are preserved. Unchanged 8-9 mm anterolisthesis at L5-S1 due to bilateral L5 pars defects. Severe disc height loss at L5-S1, also unchanged. Remaining intervertebral disc heights are preserved. Aortic atherosclerosis. IMPRESSION: 1.  Unchanged severe degenerative disc disease and grade 1 anterolisthesis at L5-S1 due to bilateral L5 pars defects. Electronically Signed   By: Titus Dubin M.D.   On: 06/11/2017 17:25   US Renal  Result Date: 06/12/2017 CLINICAL DATA:  Acute kidney injury EXAM: RENAL / URINARY TRACT ULTRASOUND COMPLETE COMPARISON:  None. FINDINGS: Right Kidney: Length: 9.6 cm. Echogenicity within normal limits. No mass or hydronephrosis visualized. Left Kidney: Length: 10.4 cm. Echogenicity within normal limits. No mass or hydronephrosis visualized. Small renal cyst measures 9 x 6 x 7 mm. Bladder: Appears normal for degree of bladder distention. Prostate: Prostate is mildly enlarged measuring 3.2 x 3.0 x 5.2 cm. IMPRESSION: 1. No hydronephrosis or other focal renal abnormality. 2. Mildly enlarged prostate gland. Electronically Signed   By: Ulyses Jarred M.D.   On: 06/12/2017 06:41   Dg Chest Portable 1 View  Result Date: 06/11/2017 CLINICAL DATA:  Chest pain and bradycardia EXAM: PORTABLE CHEST 1 VIEW COMPARISON:  Jun 03, 2017 FINDINGS: There is patchy infiltrate in the left base with small left pleural effusion. There is stable apical pleural thickening bilaterally. Lungs elsewhere are clear. Heart is mildly enlarged with pulmonary vascularity normal. No adenopathy. There is aortic atherosclerosis. There is calcification in each carotid artery. No bony lesions evident. IMPRESSION: Patchy infiltrate left base with small left pleural effusion. Stable apical pleural thickening. Stable cardiomegaly. There is aortic atherosclerosis as well as extensive visualized carotid artery calcification bilaterally. Aortic Atherosclerosis (ICD10-I70.0). Electronically Signed   By: Lowella Grip III M.D.   On: 06/11/2017 15:39    Cardiac Studies   Echo 06/04/17: Study Conclusions - Left ventricle: The cavity size was normal. There was mild concentric hypertrophy. Systolic function was normal. The estimated ejection fraction  was in the range of 55% to 60%. Hypokinesis of the basal-midanteroseptal and inferoseptal myocardium. Doppler parameters are consistent with a reversible restrictive pattern, indicative of decreased left ventricular diastolic compliance and/or increased left atrial pressure (grade 3 diastolic dysfunction). - Aortic valve: Valve mobility was restricted. Transvalvular velocity was within the normal range. There was no stenosis. There was no regurgitation. Valve area (VTI): 2.75 cm^2. Valve area (Vmax): 3.08 cm^2. Valve area (Vmean): 3.6 cm^2. - Mitral valve: Calcified annulus. Transvalvular velocity was within the normal range. There was no evidence for stenosis. There was mild regurgitation. - Left atrium: The atrium was mildly dilated. - Right ventricle:  Systolic function was normal. - Tricuspid valve: There was moderate-severe regurgitation. - Pulmonic valve: There was moderate regurgitation. - Pulmonary arteries: Systolic pressure was mildly increased. PA peak pressure: 45 mm Hg (S).  Impressions: - Visually, there appears to be severe aortic stenosis. However, gradients across the aortic valve are consistent with mild aortic stenosis. Consider repeating limited echo and looking at right parasternal views, TEE or cath if clinically indicated.     Patient Profile     84 y.o. male with remote history of atrial flutter ablation, paroxysmal atrial tachycardia, mild aortic stenosis, recently admitted for stroke with presumed embolic etiology, now admitted for severe bradycardia complicated by acute nonoliguric renal failure.  Assessment & Plan    1. Bradycardia:  Resolved.  He should not receive long-acting negative chronotropic agents, less he has a pacemaker placed.  He can use short acting as needed diltiazem 30 mg for rapid palpitations. 2. PAT:  No real evidence of this arrhythmia during this hospitalization.  Sinus tachycardia with very long  first-degree AV block is seen. 3. PAFib: This diagnosis is not documented, it was assumed based on an incorrectly interpreted electrocardiogram during his hospitalization in May for stroke.  Recommend a 30-day event monitor to clarify this diagnosis.   If he truly has it he should remain on lifelong anticoagulation: CHA2DS2-VASc Score and unadjusted Ischemic Stroke Rate (% per year) is equal to 9.7 % stroke rate/year from a score of 6 (HTN, stroke, age, plaque).   Temporarily on intravenous heparin until renal function improves.  Can resume Eliquis once renal function improves. 4. ARF:  Due to bradycardia and low cardiac output. Avoid all drugs with potential for worsening renal function such as NSAIDs, contrast and RAAS inhibitors. 5. HTN:  Has increased as diltiazem has worn off.  Amlodipine 2.5 mg once daily added. 6. AS: Moderate stenosis, not symptomatic.    For questions or updates, please contact [place] Please consult [email or web address] for contact info under Cardiology/STEMI.      Signed, Sanda Klein, MD  06/13/2017, 9:40 AM    Almost immediately after I finished consulting him, he developed a new episode of sinus pause of 3.6 seconds in duration, asymptomatic.  Not long after that another pause up to 4.5 seconds.   It has been 48 hours since his last dose of diltiazem.  We will ask EP to see.

## 2017-06-13 NOTE — Progress Notes (Signed)
Creatinine rapidly improving. He will be able to go back on Eliquis, but would keep on IV heparin until seen by EP, in case pacemaker implantation is desired on this admission.  Sanda Klein, MD, [facility] [phone] office [phone] pager

## 2017-06-14 ENCOUNTER — Other Ambulatory Visit: Payer: Self-pay | Admitting: Medical

## 2017-06-14 DIAGNOSIS — I10 Essential (primary) hypertension: Secondary | ICD-10-CM

## 2017-06-14 DIAGNOSIS — T461X1A Poisoning by calcium-channel blockers, accidental (unintentional), initial encounter: Secondary | ICD-10-CM

## 2017-06-14 DIAGNOSIS — N184 Chronic kidney disease, stage 4 (severe): Secondary | ICD-10-CM

## 2017-06-14 DIAGNOSIS — I5032 Chronic diastolic (congestive) heart failure: Secondary | ICD-10-CM

## 2017-06-14 DIAGNOSIS — I4892 Unspecified atrial flutter: Secondary | ICD-10-CM

## 2017-06-14 DIAGNOSIS — R001 Bradycardia, unspecified: Secondary | ICD-10-CM

## 2017-06-14 LAB — BASIC METABOLIC PANEL
ANION GAP: 6 (ref 5–15)
BUN: 15 mg/dL (ref 6–20)
CALCIUM: 8.2 mg/dL — AB (ref 8.9–10.3)
CO2: 23 mmol/L (ref 22–32)
Chloride: 109 mmol/L (ref 101–111)
Creatinine, Ser: 1.67 mg/dL — ABNORMAL HIGH (ref 0.61–1.24)
GFR, EST AFRICAN AMERICAN: 42 mL/min — AB (ref >60)
GFR, EST NON AFRICAN AMERICAN: 36 mL/min — AB (ref >60)
Glucose, Bld: 89 mg/dL (ref 65–99)
POTASSIUM: 4.7 mmol/L (ref 3.5–5.1)
Sodium: 138 mmol/L (ref 135–145)

## 2017-06-14 LAB — CBC
HCT: 32.4 % — ABNORMAL LOW (ref 39.0–52.0)
Hemoglobin: 10.4 g/dL — ABNORMAL LOW (ref 13.0–17.0)
MCH: 27.7 pg (ref 26.0–34.0)
MCHC: 32.1 g/dL (ref 30.0–36.0)
MCV: 86.4 fL (ref 78.0–100.0)
PLATELETS: 199 10*3/uL (ref 150–400)
RBC: 3.75 MIL/uL — AB (ref 4.22–5.81)
RDW: 14.8 % (ref 11.5–15.5)
WBC: 7.5 10*3/uL (ref 4.0–10.5)

## 2017-06-14 LAB — APTT: APTT: 62 s — AB (ref 24–36)

## 2017-06-14 LAB — HEPARIN LEVEL (UNFRACTIONATED): Heparin Unfractionated: 0.61 IU/mL (ref 0.30–0.70)

## 2017-06-14 MED ORDER — APIXABAN 2.5 MG PO TABS: 2.5000 mg | ORAL_TABLET | Freq: Two times a day (BID) | ORAL | Status: DC

## 2017-06-14 MED ORDER — AMLODIPINE BESYLATE 2.5 MG PO TABS: 2.5000 mg | ORAL_TABLET | Freq: Every day | ORAL | 0 refills | Status: DC

## 2017-06-14 NOTE — Progress Notes (Signed)
[place] for Heparin (Apixaban on hold) Indication: atrial fibrillation, recent CVA  Allergies  Allergen Reactions  . Dyazide [Hydrochlorothiazide W-Triamterene] Other (See Comments)    Caused hyponatremia 04/2012  . Lisinopril Other (See Comments)    cough    Patient Measurements: Height: 6\' 1"  (185.4 cm) Weight: 188 lb 8 oz (85.5 kg) IBW/kg (Calculated) : 79.9 Heparin Dosing Weight: 86.6 kg  Vital Signs: Temp: 98 F (36.7 C) (06/07 0523) Temp Source: Oral (06/07 0523) BP: 168/83 (06/07 0840) Pulse Rate: 83 (06/07 0523)  Labs: Recent Labs    06/11/17 1454 06/11/17 1517  06/12/17 0235  06/13/17 0354 06/13/17 0912 06/13/17 1407 06/13/17 1945 06/14/17 0500  HGB 10.9* 11.2*  --  10.5*  --  10.9*  --   --   --  10.4*  HCT 34.8* 33.0*  --  33.0*  --  34.2*  --   --   --  32.4*  PLT 210  --   --  181  --  202  --   --   --  199  APTT  --   --    < > 175*   < > 136*  --  77* 94* 62*  HEPARINUNFRC  --   --    < > >2.20*  --  1.70*  --   --   --  0.61  CREATININE 4.76* 4.70*  --  3.68*  --   --  1.97*  --   --   --   TROPONINI 0.03*  --   --   --   --   --   --   --   --   --    < > = values in this interval not displayed.    Estimated Creatinine Clearance: 31.5 mL/min (A) (by C-G formula based on SCr of 1.97 mg/dL (H)).   Medical History: Past Medical History:  Diagnosis Date  . Abnormal EKG    ST changes with normal coronaries by cath 05/06/12.  . Arthritis    "hands" (01/12/2013); feet (podiatry consultation); "back" (06/12/2017)  . Atrial flutter ([place])    a. Slow ~100bpm when in 2:1; dx 04/2012. b. Placed on Xarelto.  c. s/p ablation 07-08-2012 by Dr Rayann Heman  . Atrial tachycardia ([place])   . Carotid artery calcification    By CXR - dopplers 05/06/12 without obvious evidence of significant ICA stenosis >40%  . Chronic lower back pain   . Colon polyps 06/26/2005  . COPD (chronic obstructive pulmonary disease) ([place])   . Diverticulosis  of colon (without mention of hemorrhage)   . Dysrhythmia   . Gastritis, chronic    Pt denies bleeding 04/2012. EGD 2012 reportedly normal.  . GERD (gastroesophageal reflux disease)   . Heart murmur   . Hypertension   . Hyponatremia    04/2012 r/t diuretic.  . IBS (irritable bowel syndrome)   . Internal and external hemorrhoids without complication   . Macular degeneration of left eye   . Pneumonia    "@ least 5 times" (06/12/2017)  . Shortness of breath dyspnea    walking distances  . Tongue cancer ([place]) 02/1997    Assessment: 81 yoM admitted with bradycardia. Pt on Eliquis PTA for AFib to transition to heparin per pharmacy while admitted with concern for renal failure. Per EP- continuing to monitor, no pacemaker now.  -aPTT= 62, HL= 0.61 (possible influence of Eliquis) -SCr= 1.97 (was 2.1 05/2015; prior to that baseline was  1.2-1.4)   Goal of Therapy:  Heparin level 0.3-0.5 units/mL APTT 66-84 secs  Monitor platelets by anticoagulation protocol: Yes   Plan:  -Increase heparin to 750 units/hr -Daily aPTT/heparin level  Hildred Laser, PharmD Clinical Pharmacist Clinical phone from 8:30-4:00 is [phone] After 4pm, please call [facility] [phone]) for assistance. 06/14/2017 9:41 AM

## 2017-06-14 NOTE — Progress Notes (Signed)
Progress Note  Patient Name: Chad [name] Date of Encounter: 06/14/2017  Primary Cardiologist: Kirk Ruths, MD   Subjective   Feels well. No new bradycardia/pauses since yesterday 9:30AM  Inpatient Medications    Scheduled Meds: . amLODipine  2.5 mg Oral Daily  . atorvastatin  40 mg Oral q1800  . clidinium-chlordiazePOXIDE  1 capsule Oral BID  . famotidine  20 mg Oral QHS  . fluticasone  2 spray Each Nare QHS  . gabapentin  300 mg Oral TID  . lidocaine  1 patch Transdermal Q24H  . pantoprazole  40 mg Oral Daily  . senna-docusate  1 tablet Oral Daily  . sodium chloride flush  3 mL Intravenous Q12H  . vitamin E  100 Units Oral Daily   Continuous Infusions: . sodium chloride 50 mL/hr at 06/13/17 1948  . heparin 700 Units/hr (06/13/17 2219)   PRN Meds: acetaminophen **OR** acetaminophen, ondansetron **OR** ondansetron (ZOFRAN) IV   Vital Signs    Vitals:   06/13/17 1710 06/13/17 2016 06/14/17 0523 06/14/17 0840  BP: (!) 160/83 132/82 (!) 126/91 (!) 168/83  Pulse: 75 88 83   Resp:      Temp: 97.7 F (36.5 C) 97.7 F (36.5 C) 98 F (36.7 C)   TempSrc: Oral Oral Oral   SpO2: 98% 99% 97%   Weight:   188 lb 8 oz (85.5 kg)   Height:        Intake/Output Summary (Last 24 hours) at 06/14/2017 1036 Last data filed at 06/14/2017 0521 Gross per 24 hour  Intake -  Output 1350 ml  Net -1350 ml   Filed Weights   06/12/17 0601 06/13/17 0417 06/14/17 0523  Weight: 189 lb (85.7 kg) 187 lb 6.4 oz (85 kg) 188 lb 8 oz (85.5 kg)    Telemetry    NSR, long 1st deg AVB; no bradyarrhythmia in last 24h - Personally Reviewed  ECG    No new tracing - Personally Reviewed  Physical Exam  Alert, relaxed GEN: No acute distress.   Neck: No JVD Cardiac: RRR,very harsh holosystolic murmur LLSB, probably separate AS murmur, early peakingatRUSB, no diastolic murmurs, rubs, or gallops Respiratory: Clear to auscultation bilaterally. GI: Soft, nontender, non-distended  MS: No  edema; No deformity. Neuro:  Nonfocal  Psych: Normal affect   Labs    Chemistry Recent Labs  Lab 06/11/17 1454  06/12/17 0235 06/13/17 0912 06/14/17 0847  NA 131*   < > 134* 137 138  K 4.6   < > 4.9 4.8 4.7  CL 97*   < > 103 107 109  CO2 20*  --  25 24 23   GLUCOSE 99   < > 100* 107* 89  BUN 40*   < > 34* 19 15  CREATININE 4.76*   < > 3.68* 1.97* 1.67*  CALCIUM 8.8*  --  8.3* 8.2* 8.2*  PROT 7.2  --   --   --   --   ALBUMIN 3.3*  --   --   --   --   AST 26  --   --   --   --   ALT 15*  --   --   --   --   ALKPHOS 67  --   --   --   --   BILITOT 0.7  --   --   --   --   GFRNONAA 10*  --  14* 29* 36*  GFRAA 12*  --  16* 34* 42*  ANIONGAP 14  --  6 6 6    < > = values in this interval not displayed.     Hematology Recent Labs  Lab 06/12/17 0235 06/13/17 0354 06/14/17 0500  WBC 9.5 7.9 7.5  RBC 3.81* 3.90* 3.75*  HGB 10.5* 10.9* 10.4*  HCT 33.0* 34.2* 32.4*  MCV 86.6 87.7 86.4  MCH 27.6 27.9 27.7  MCHC 31.8 31.9 32.1  RDW 14.7 14.8 14.8  PLT 181 202 199    Cardiac Enzymes Recent Labs  Lab 06/11/17 1454  TROPONINI 0.03*   No results for input(s): TROPIPOC in the last 168 hours.   BNP Recent Labs  Lab 06/12/17 0235  BNP 1,385.5*     DDimer No results for input(s): DDIMER in the last 168 hours.   Radiology    No results found.  Cardiac Studies    Echo 06/04/17: Study Conclusions - Left ventricle: The cavity size was normal. There was mild concentric hypertrophy. Systolic function was normal. The estimated ejection fraction was in the range of 55% to 60%. Hypokinesis of the basal-midanteroseptal and inferoseptal myocardium. Doppler parameters are consistent with a reversible restrictive pattern, indicative of decreased left ventricular diastolic compliance and/or increased left atrial pressure (grade 3 diastolic dysfunction). - Aortic valve: Valve mobility was restricted. Transvalvular velocity was within the normal range. There  was no stenosis. There was no regurgitation. Valve area (VTI): 2.75 cm^2. Valve area (Vmax): 3.08 cm^2. Valve area (Vmean): 3.6 cm^2. - Mitral valve: Calcified annulus. Transvalvular velocity was within the normal range. There was no evidence for stenosis. There was mild regurgitation. - Left atrium: The atrium was mildly dilated. - Right ventricle: Systolic function was normal. - Tricuspid valve: There was moderate-severe regurgitation. - Pulmonic valve: There was moderate regurgitation. - Pulmonary arteries: Systolic pressure was mildly increased. PA peak pressure: 45 mm Hg (S).  Impressions: - Visually, there appears to be severe aortic stenosis. However, gradients across the aortic valve are consistent with mild aortic stenosis. Consider repeating limited echo and looking at right parasternal views, TEE or cath if clinically indicated.     Patient Profile     84 y.o. male with remote history of atrial flutter ablation, paroxysmal atrial tachycardia, mild aortic stenosis, recently admitted for stroke with presumed embolic etiology,nowadmitted for severe bradycardia complicated by acute nonoliguric renal failure.  Assessment & Plan    1. Bradycardia: Resolved > 24hours.  He should not receive long-acting negative chronotropic agents, less he first has a pacemaker placed.  He can use short acting as needed diltiazem 30 mg for rapid palpitations. 2. PAT: Not seen during this hospitalization.  Sinus tachycardia with very long first-degree AV block is seen. 3. PAFib:This diagnosis is not documented, it was assumed based on an incorrectly interpreted electrocardiogram during his hospitalization in May for stroke.  Recommend a 30-day event monitor to clarify this diagnosis.  If he truly has it he should remain on lifelong anticoagulation:CHA2DS2-VASc Score and unadjusted Ischemic Stroke Rate (% per year) is equal to 9.7 % stroke rate/year from a score of 6 (HTN,  stroke, age, plaque). Temporarily on intravenous heparin until renal function improved.  Can resume Eliquis now (GFR around 40). 4. ARF: Due to bradycardia and low cardiac output.Resolved. Avoid all drugs with potential for worsening renal function such as NSAIDs, contrastand RAAS inhibitors.  5. HTN: Has increased as diltiazem has worn off.  Amlodipine 2.5 mg once daily added yesterday, wait for a week until we titrate further. Avoid diltiazem, verapamil, beta blockers. 6.  AS: Moderate stenosis, not yet symptomatic.  OK for DC. Will arrange outpatient event monitor for possible atrial fibrillation diagnosis.  For questions or updates, please contact [place] Please consult [email or web address] for contact info under Cardiology/STEMI.      Signed, Sanda Klein, MD  06/14/2017, 10:36 AM

## 2017-06-14 NOTE — Care Management Important Message (Signed)
Important Message  Patient Details  Name: Chad [name] MRN: [id number] Date of Birth: 11-06-1931   Medicare Important Message Given:  Yes    Morrison Masser P Ninette Cotta 06/14/2017, 3:34 PM

## 2017-06-14 NOTE — Discharge Summary (Signed)
Physician Discharge Summary  Chad [name] IDP:[id number] DOB: 08/16/1931 DOA: 06/11/2017  PCP: Cassandria Anger, MD  Admit date: 06/11/2017 Discharge date: 06/14/2017  Time spent: 42 minutes  Recommendations for Outpatient Follow-up:  1. Follow-up PCP in 1 week 2. Follow-up with cardiology on 06/27/2017  Ms. Barrett at 1:30 PM 3. Follow-up with cardiology group on 06/18/2017 to arrange 30-day heart monitor at 12:30 PM  Discharge Diagnoses:  Principal Problem:   Acute kidney injury [facility]) Active Problems:   Back pain   Symptomatic bradycardia   Hypertension   COPD (chronic obstructive pulmonary disease) w/ bronchiectasis   History of CVA (cerebrovascular accident)-May 2019   Acute hyponatremia   Chronic kidney disease (CKD), stage IV (severe) (HCC)   Chronic diastolic heart failure (HCC)   HLD (hyperlipidemia)   Paroxysmal atrial flutter (HCC)   Bradycardia   Calcium channel blocker overdose   Discharge Condition: Stable and improved back to baseline  Diet recommendation: Healthy heart  Filed Weights   06/12/17 0601 06/13/17 0417 06/14/17 0523  Weight: 85.7 kg (189 lb) 85 kg (187 lb 6.4 oz) 85.5 kg (188 lb 8 oz)     Hospital Course:  84 year old male presented with acute on chronic kidney disease and was found to have significant bradycardia with hypotension.  Worsening of his renal failure was thought to be due to blood pressure medications and prerenal azotemia from bradycardia and hypotension.  His Eliquis was changed to heparin due to his renal failure.  He received IV fluids.  His diuretics along with his ARB and other blood pressure medications were held.  Within 48 hours his renal function markedly improved.  His creatinine peaked at 4.76 at the time of discharge it was less than 2.  Patient urinating normally throughout hospitalization.  Is also noted to have significant pauses up to 4.5 seconds on his telemetry monitoring.  Cardiology was involved who advised since  patient was asymptomatic to hold off on pacemaker placement at this time.  They are also arrange for him to get a [employer] P[facility] [employer] as an outpatient.  Renal ultrasound was negative for any acute issues.  Was recommended for him to avoid all negative chronotropic agents in the future.  His diltiazem was stopped.  His Eliquis was resumed once his renal function improved. Patient instructed to avoid NSAID usage over-the-counter.  Norvasc 2.5 mg p.o. daily was added to his medications for hypertension control yesterday.  Cardiology will continue to adjust his blood pressure medications as an outpatient.  Discharge in stable and improved condition.  He has the above arrangement for follow-up with his primary care physician and cardiology service arranged.  Him his wife and his son have been all updated and all questions answered at bedside.  Discharge Exam: Vitals:   06/14/17 0523 06/14/17 0840  BP: (!) 126/91 (!) 168/83  Pulse: 83   Resp:    Temp: 98 F (36.7 C)   SpO2: 97%     General: Alert and oriented x4 no apparent distress Cardiovascular: Regular rate and rhythm without murmurs rubs or gallops Respiratory: Clear to auscultation bilaterally no wheezes rhonchus or rales  Discharge Instructions   Discharge Instructions    Diet - low sodium heart healthy   Complete by:  As directed    Increase activity slowly   Complete by:  As directed      Allergies as of 06/14/2017      Reactions   Dyazide [hydrochlorothiazide W-triamterene] Other (See Comments)   Caused hyponatremia  04/2012   Lisinopril Other (See Comments)   cough      Medication List    STOP taking these medications   diltiazem 120 MG 24 hr capsule Commonly known as:  CARDIZEM CD     TAKE these medications   amLODipine 2.5 MG tablet Commonly known as:  NORVASC Take 1 tablet (2.5 mg total) by mouth daily. Start taking on:  06/15/2017   apixaban 2.5 MG Tabs tablet Commonly known as:  ELIQUIS Take 1 tablet (2.5 mg total)  by mouth 2 (two) times daily.   atorvastatin 40 MG tablet Commonly known as:  LIPITOR Take 1 tablet (40 mg total) by mouth daily at 6 PM.   cholecalciferol 1000 units tablet Commonly known as:  VITAMIN D Take 1 tablet (1,000 Units total) by mouth daily.   clidinium-chlordiazePOXIDE 5-2.5 MG capsule Commonly known as:  LIBRAX TAKE 1 CAPSULE BY MOUTH TWICE DAILY   cyanocobalamin 1000 MCG tablet Take 1,000 mcg by mouth daily.   famotidine 20 MG tablet Commonly known as:  PEPCID One at bedtime What changed:    how much to take  how to take this  when to take this  additional instructions   fluticasone 50 MCG/ACT nasal spray Commonly known as:  FLONASE Place 2 sprays into both nostrils daily. What changed:  when to take this   gabapentin 300 MG capsule Commonly known as:  NEURONTIN Take 3 capsules at bedtime. What changed:    how much to take  how to take this  when to take this  additional instructions   multivitamin-lutein Caps capsule Take 1 capsule by mouth daily.   pantoprazole 40 MG tablet Commonly known as:  PROTONIX Take 1 tablet (40 mg total) by mouth daily. Take 30-60 min before first meal of the day   polyethylene glycol powder powder Commonly known as:  GLYCOLAX/MIRALAX Take 17 g by mouth 2 (two) times daily as needed.   senna-docusate 8.6-50 MG tablet Commonly known as:  Senokot-S Take 1 tablet by mouth daily.   vitamin E 100 UNIT capsule Take 100 Units by mouth daily.      Allergies  Allergen Reactions  . Dyazide [Hydrochlorothiazide W-Triamterene] Other (See Comments)    Caused hyponatremia 04/2012  . Lisinopril Other (See Comments)    cough   Follow-up Information    Barrett, Evelene Croon, PA-C Follow up on 06/27/2017.   Specialties:  Cardiology, Radiology Why:  Please arrive 15 minutes early for your 1:30pm appointment Contact information: [address] [address] [place] [address] [phone]        [place] [facility] Follow up on 06/18/2017.   Specialty:  Cardiology Why:  Please arrive 15 minutes early for your 12:30pm appointment to pick up your 30-day heart monitor. Contact information: [address] [place] [phone]       Plotnikov, Evie Lacks, MD Follow up in 1 week(s).   Specialty:  Internal Medicine Contact information: [place] [address] [phone]        Lelon Perla, MD .   Specialty:  Cardiology Contact information: [address] [place] [address] [phone]            The results of significant diagnostics from this hospitalization (including imaging, microbiology, ancillary and laboratory) are listed below for reference.    Significant Diagnostic Studies: Dg Chest 2 View  Result Date: 06/03/2017 CLINICAL DATA:  Transient ischemic attack. History  of cardiac arrhythmia EXAM: CHEST - 2 VIEW COMPARISON:  None. FINDINGS: There is bibasilar fibrotic change with mild lower lobe bronchiectatic change. No edema or consolidation. Heart is mildly enlarged with pulmonary vascularity normal. No adenopathy. There is aortic atherosclerosis. No evident bone lesions. There is calcification in both carotid arteries. IMPRESSION: Bibasilar fibrosis and bronchiectasis. No frank edema or consolidation. Stable cardiac prominence with pulmonary vascularity normal. There is aortic atherosclerosis as well as extensive carotid artery calcification bilaterally. Aortic Atherosclerosis (ICD10-I70.0). Electronically Signed   By: Lowella Grip III M.D.   On: 06/03/2017 15:25   Dg Lumbar Spine 2-3 Views  Result Date: 06/11/2017 CLINICAL DATA:  Chronic back pain. EXAM: LUMBAR SPINE - 2-3 VIEW COMPARISON:  CT abdomen pelvis dated November 22, 2015. FINDINGS: Five lumbar type vertebral bodies. No acute fracture or subluxation. Vertebral body heights are preserved. Unchanged 8-9 mm anterolisthesis at L5-S1  due to bilateral L5 pars defects. Severe disc height loss at L5-S1, also unchanged. Remaining intervertebral disc heights are preserved. Aortic atherosclerosis. IMPRESSION: 1. Unchanged severe degenerative disc disease and grade 1 anterolisthesis at L5-S1 due to bilateral L5 pars defects. Electronically Signed   By: Titus Dubin M.D.   On: 06/11/2017 17:25   Ct Head Wo Contrast  Result Date: 06/03/2017 CLINICAL DATA:  Left upper extremity weakness EXAM: CT HEAD WITHOUT CONTRAST TECHNIQUE: Contiguous axial images were obtained from the base of the skull through the vertex without intravenous contrast. COMPARISON:  None. FINDINGS: Brain: There is mild diffuse atrophy. There is no intracranial mass, hemorrhage, extra-axial fluid collection, or midline shift. There is evidence of a prior small infarct in medial superior right frontal lobe. There is patchy small vessel disease in the centra semiovale bilaterally. Elsewhere gray-white compartments appear normal. Vascular: No hyperdense vessels are evident. There is calcification each carotid siphon region and distal vertebral artery. Skull: The bony calvarium appears intact. Sinuses/Orbits: There is mucosal thickening in multiple ethmoid air cells with opacification of anterior ethmoid air cells on the left. Other visualized paranasal sinuses are clear. There is an old healed fracture of the right nasal bone. Orbits appear symmetric bilaterally except for evidence of previous cataract resection on the left. Other: Visualized mastoid air cells are clear. IMPRESSION: Atrophy with periventricular small vessel disease. Prior infarct medial superior right frontal lobe. No acute infarct demonstrable. No mass or hemorrhage evident. Foci arterial vascular calcification. Areas of paranasal sinus disease. Old healed fracture right nasal bone. Electronically Signed   By: Lowella Grip III M.D.   On: 06/03/2017 13:16   Mr Brain Wo Contrast  Result Date:  06/03/2017 CLINICAL DATA:  Acute onset of LEFT arm weakness. EXAM: MRI HEAD WITHOUT CONTRAST MRA HEAD WITHOUT CONTRAST TECHNIQUE: Multiplanar, multiecho pulse sequences of the brain and surrounding structures were obtained without intravenous contrast. Angiographic images of the head were obtained using MRA technique without contrast. COMPARISON:  CT head earlier today. FINDINGS: MRI HEAD FINDINGS Brain: Small foci of restricted diffusion affect the RIGHT posterior frontal cortex and subcortical white matter consistent with acute infarction. No similar lesions elsewhere. No hemorrhage, mass lesion, or extra-axial fluid. Generalized atrophy. Hydrocephalus ex vacuo. Mild subcortical and periventricular T2 and FLAIR hyperintensities, likely chronic microvascular ischemic change. Old RIGHT ACA infarct affects the medial frontal cortex. Vascular: Normal flow voids. Skull and upper cervical spine: Normal marrow signal. Sinuses/Orbits: Negative. Other: None. MRA HEAD FINDINGS The internal carotid arteries are widely patent. The basilar artery is widely patent with vertebrals codominant. RIGHT A1 ACA widely patent.  The distal anterior cerebral arteries are fed exclusively from the RIGHT A1 ACA. The LEFT A1 ACA is diseased or hypoplastic. Both middle cerebral arteries are widely patent. No MCA disease of significance. Both posterior cerebral arteries are widely patent. No cerebellar branch occlusion. No saccular aneurysm. IMPRESSION: Acute RIGHT MCA territory infarct, nonhemorrhagic, affecting the precentral posterior frontal cortex and subcortical white matter. The location would correlate with symptoms of LEFT arm weakness. Atrophy and small vessel disease.  Old RIGHT ACA infarct. MRA demonstrating no ICA or MCA abnormality of significance. Severely diseased hypoplastic LEFT A1 ACA, incidental finding. Electronically Signed   By: Staci Righter M.D.   On: 06/03/2017 19:57   US Renal  Result Date: 06/12/2017 CLINICAL  DATA:  Acute kidney injury EXAM: RENAL / URINARY TRACT ULTRASOUND COMPLETE COMPARISON:  None. FINDINGS: Right Kidney: Length: 9.6 cm. Echogenicity within normal limits. No mass or hydronephrosis visualized. Left Kidney: Length: 10.4 cm. Echogenicity within normal limits. No mass or hydronephrosis visualized. Small renal cyst measures 9 x 6 x 7 mm. Bladder: Appears normal for degree of bladder distention. Prostate: Prostate is mildly enlarged measuring 3.2 x 3.0 x 5.2 cm. IMPRESSION: 1. No hydronephrosis or other focal renal abnormality. 2. Mildly enlarged prostate gland. Electronically Signed   By: Ulyses Jarred M.D.   On: 06/12/2017 06:41   Dg Chest Portable 1 View  Result Date: 06/11/2017 CLINICAL DATA:  Chest pain and bradycardia EXAM: PORTABLE CHEST 1 VIEW COMPARISON:  Jun 03, 2017 FINDINGS: There is patchy infiltrate in the left base with small left pleural effusion. There is stable apical pleural thickening bilaterally. Lungs elsewhere are clear. Heart is mildly enlarged with pulmonary vascularity normal. No adenopathy. There is aortic atherosclerosis. There is calcification in each carotid artery. No bony lesions evident. IMPRESSION: Patchy infiltrate left base with small left pleural effusion. Stable apical pleural thickening. Stable cardiomegaly. There is aortic atherosclerosis as well as extensive visualized carotid artery calcification bilaterally. Aortic Atherosclerosis (ICD10-I70.0). Electronically Signed   By: Lowella Grip III M.D.   On: 06/11/2017 15:39   Mr Jodene Nam Head Wo Contrast  Result Date: 06/03/2017 CLINICAL DATA:  Acute onset of LEFT arm weakness. EXAM: MRI HEAD WITHOUT CONTRAST MRA HEAD WITHOUT CONTRAST TECHNIQUE: Multiplanar, multiecho pulse sequences of the brain and surrounding structures were obtained without intravenous contrast. Angiographic images of the head were obtained using MRA technique without contrast. COMPARISON:  CT head earlier today. FINDINGS: MRI HEAD FINDINGS  Brain: Small foci of restricted diffusion affect the RIGHT posterior frontal cortex and subcortical white matter consistent with acute infarction. No similar lesions elsewhere. No hemorrhage, mass lesion, or extra-axial fluid. Generalized atrophy. Hydrocephalus ex vacuo. Mild subcortical and periventricular T2 and FLAIR hyperintensities, likely chronic microvascular ischemic change. Old RIGHT ACA infarct affects the medial frontal cortex. Vascular: Normal flow voids. Skull and upper cervical spine: Normal marrow signal. Sinuses/Orbits: Negative. Other: None. MRA HEAD FINDINGS The internal carotid arteries are widely patent. The basilar artery is widely patent with vertebrals codominant. RIGHT A1 ACA widely patent. The distal anterior cerebral arteries are fed exclusively from the RIGHT A1 ACA. The LEFT A1 ACA is diseased or hypoplastic. Both middle cerebral arteries are widely patent. No MCA disease of significance. Both posterior cerebral arteries are widely patent. No cerebellar branch occlusion. No saccular aneurysm. IMPRESSION: Acute RIGHT MCA territory infarct, nonhemorrhagic, affecting the precentral posterior frontal cortex and subcortical white matter. The location would correlate with symptoms of LEFT arm weakness. Atrophy and small vessel disease.  Old RIGHT  ACA infarct. MRA demonstrating no ICA or MCA abnormality of significance. Severely diseased hypoplastic LEFT A1 ACA, incidental finding. Electronically Signed   By: Staci Righter M.D.   On: 06/03/2017 19:57    Microbiology: Recent Results (from the past 240 hour(s))  MRSA PCR Screening     Status: None   Collection Time: 06/11/17  6:20 PM  Result Value Ref Range Status   MRSA by PCR NEGATIVE NEGATIVE Final    Comment:        The GeneXpert MRSA Assay (FDA approved for NASAL specimens only), is one component of a comprehensive MRSA colonization surveillance program. It is not intended to diagnose MRSA infection nor to guide or monitor  treatment for MRSA infections. Performed at [facility], [place] [address], [place], [place] [address]   Culture, Urine     Status: Abnormal   Collection Time: 06/11/17 10:31 PM  Result Value Ref Range Status   Specimen Description URINE, CLEAN CATCH  Final   Special Requests   Final    NONE Performed at [facility], [place] [address] [place], [place] [address]    Culture MULTIPLE SPECIES PRESENT, SUGGEST RECOLLECTION (A)  Final   Report Status 06/13/2017 FINAL  Final     Labs: Basic Metabolic Panel: Recent Labs  Lab 06/11/17 1454 06/11/17 1517 06/11/17 1838 06/12/17 0235 06/13/17 0912 06/14/17 0847  NA 131* 130*  --  134* 137 138  K 4.6 4.5  --  4.9 4.8 4.7  CL 97* 96*  --  103 107 109  CO2 20*  --   --  25 24 23   GLUCOSE 99 104*  --  100* 107* 89  BUN 40* 38*  --  34* 19 15  CREATININE 4.76* 4.70*  --  3.68* 1.97* 1.67*  CALCIUM 8.8*  --   --  8.3* 8.2* 8.2*  MG  --   --  2.3  --  1.8  --    Liver Function Tests: Recent Labs  Lab 06/11/17 1454  AST 26  ALT 15*  ALKPHOS 67  BILITOT 0.7  PROT 7.2  ALBUMIN 3.3*   No results for input(s): LIPASE, AMYLASE in the last 168 hours. No results for input(s): AMMONIA in the last 168 hours. CBC: Recent Labs  Lab 06/11/17 1454 06/11/17 1517 06/12/17 0235 06/13/17 0354 06/14/17 0500  WBC 11.6*  --  9.5 7.9 7.5  NEUTROABS 8.4*  --   --   --   --   HGB 10.9* 11.2* 10.5* 10.9* 10.4*  HCT 34.8* 33.0* 33.0* 34.2* 32.4*  MCV 88.5  --  86.6 87.7 86.4  PLT 210  --  181 202 199   Cardiac Enzymes: Recent Labs  Lab 06/11/17 1454  TROPONINI 0.03*   BNP: BNP (last 3 results) Recent Labs    06/12/17 0235  BNP 1,385.5*    ProBNP (last 3 results) Recent Labs    04/24/17 1450  PROBNP 246.0*    CBG: No results for input(s): GLUCAP in the last 168 hours.     Signed:  Phillips Grout MD.  [facility] 06/14/2017, 5:11 PM

## 2017-06-17 ENCOUNTER — Ambulatory Visit (INDEPENDENT_AMBULATORY_CARE_PROVIDER_SITE_OTHER): Payer: Medicare Other | Admitting: Internal Medicine

## 2017-06-17 ENCOUNTER — Encounter: Payer: Self-pay | Admitting: Internal Medicine

## 2017-06-17 DIAGNOSIS — I5032 Chronic diastolic (congestive) heart failure: Secondary | ICD-10-CM

## 2017-06-17 DIAGNOSIS — I4892 Unspecified atrial flutter: Secondary | ICD-10-CM | POA: Diagnosis not present

## 2017-06-17 DIAGNOSIS — B079 Viral wart, unspecified: Secondary | ICD-10-CM

## 2017-06-17 DIAGNOSIS — T461X1S Poisoning by calcium-channel blockers, accidental (unintentional), sequela: Secondary | ICD-10-CM | POA: Diagnosis not present

## 2017-06-17 DIAGNOSIS — N184 Chronic kidney disease, stage 4 (severe): Secondary | ICD-10-CM

## 2017-06-17 DIAGNOSIS — R001 Bradycardia, unspecified: Secondary | ICD-10-CM

## 2017-06-17 NOTE — Assessment & Plan Note (Signed)
In NSR now 

## 2017-06-17 NOTE — Assessment & Plan Note (Signed)
Cryo  

## 2017-06-17 NOTE — Assessment & Plan Note (Signed)
BMET Hydration  

## 2017-06-17 NOTE — Assessment & Plan Note (Signed)
Amlodipine - low dose

## 2017-06-17 NOTE — Assessment & Plan Note (Addendum)
He is deconditioned - mild exercise discussed Eliquis

## 2017-06-17 NOTE — Progress Notes (Signed)
Subjective:  Patient ID: Chad [name], male    DOB: [birth year]  Age: 84 y.o. MRN: [id number]  CC: No chief complaint on file.   HPI Chad [name] presents for recent hosp stay for bradycardia (d/c'd on 06/14/17). He is to get a monitor tomorrow. Per hx: "84 year old male presented with acute on chronic kidney disease and was found to have significant bradycardia with hypotension.  Worsening of his renal failure was thought to be due to blood pressure medications and prerenal azotemia from bradycardia and hypotension.  His Eliquis was changed to heparin due to his renal failure.  He received IV fluids.  His diuretics along with his ARB and other blood pressure medications were held.  Within 48 hours his renal function markedly improved.  His creatinine peaked at 4.76 at the time of discharge it was less than 2.  Patient urinating normally throughout hospitalization.  Is also noted to have significant pauses up to 4.5 seconds on his telemetry monitoring.  Cardiology was involved who advised since patient was asymptomatic to hold off on pacemaker placement at this time.  They are also arrange for him to get a [facility] as an outpatient.  Renal ultrasound was negative for any acute issues.  Was recommended for him to avoid all negative chronotropic agents in the future.  His diltiazem was stopped.  His Eliquis was resumed once his renal function improved. Patient instructed to avoid NSAID usage over-the-counter.  Norvasc 2.5 mg p.o. daily was added to his medications for hypertension control yesterday.  Cardiology will continue to adjust his blood pressure medications as an outpatient.  Discharge in stable and improved condition.  He has the above arrangement for follow-up with his primary care physician and cardiology service arranged.  Him his wife and his son have been all updated and all questions answered at bedside."  F/u ARF/CRI, COPD, PAF    Outpatient Medications Prior to Visit    Medication Sig Dispense Refill  . amLODipine (NORVASC) 2.5 MG tablet Take 1 tablet (2.5 mg total) by mouth daily. 30 tablet 0  . apixaban (ELIQUIS) 2.5 MG TABS tablet Take 1 tablet (2.5 mg total) by mouth 2 (two) times daily. 60 tablet 1  . atorvastatin (LIPITOR) 40 MG tablet Take 1 tablet (40 mg total) by mouth daily at 6 PM. 30 tablet 1  . cholecalciferol (VITAMIN D) 1000 units tablet Take 1 tablet (1,000 Units total) by mouth daily. 100 tablet 3  . clidinium-chlordiazePOXIDE (LIBRAX) 5-2.5 MG capsule TAKE 1 CAPSULE BY MOUTH TWICE DAILY 60 capsule 3  . cyanocobalamin 1000 MCG tablet Take 1,000 mcg by mouth daily.     . famotidine (PEPCID) 20 MG tablet One at bedtime (Patient taking differently: Take 20 mg by mouth at bedtime. ) 30 tablet 11  . fluticasone (FLONASE) 50 MCG/ACT nasal spray Place 2 sprays into both nostrils daily. (Patient taking differently: Place 2 sprays into both nostrils at bedtime. ) 48 g 3  . gabapentin (NEURONTIN) 300 MG capsule Take 3 capsules at bedtime. (Patient taking differently: Take 300 mg by mouth 3 (three) times daily. ) 270 capsule 3  . multivitamin-lutein (OCUVITE-LUTEIN) CAPS capsule Take 1 capsule by mouth daily.     . pantoprazole (PROTONIX) 40 MG tablet Take 1 tablet (40 mg total) by mouth daily. Take 30-60 min before first meal of the day 30 tablet 2  . polyethylene glycol powder (GLYCOLAX/MIRALAX) powder Take 17 g by mouth 2 (two) times daily as needed. 3350 g  1  . senna-docusate (SENOKOT-S) 8.6-50 MG tablet Take 1 tablet by mouth daily.    . vitamin E 100 UNIT capsule Take 100 Units by mouth daily.     No facility-administered medications prior to visit.     ROS: Review of Systems  Constitutional: Positive for fatigue. Negative for appetite change and unexpected weight change.  HENT: Negative for congestion, nosebleeds, sneezing, sore throat and trouble swallowing.   Eyes: Negative for itching and visual disturbance.  Respiratory: Negative for  cough.   Cardiovascular: Negative for chest pain, palpitations and leg swelling.  Gastrointestinal: Negative for abdominal distention, blood in stool, diarrhea and nausea.  Genitourinary: Negative for frequency and hematuria.  Musculoskeletal: Negative for back pain, gait problem, joint swelling and neck pain.  Skin: Negative for rash.  Neurological: Negative for dizziness, tremors, speech difficulty and weakness.  Psychiatric/Behavioral: Negative for agitation, dysphoric mood and sleep disturbance. The patient is not nervous/anxious.     Objective:  BP 114/70 (BP Location: Left Arm, Patient Position: Sitting, Cuff Size: Normal)   Pulse 84   Temp 97.8 F (36.6 C) (Oral)   Ht 6\' 1"  (1.854 m)   Wt 192 lb (87.1 kg)   SpO2 98%   BMI 25.33 kg/m   BP Readings from Last 3 Encounters:  06/17/17 114/70  06/14/17 (!) 168/83  06/05/17 136/71    Wt Readings from Last 3 Encounters:  06/17/17 192 lb (87.1 kg)  06/14/17 188 lb 8 oz (85.5 kg)  05/27/17 191 lb (86.6 kg)    Physical Exam  Constitutional: He is oriented to person, place, and time. He appears well-developed. No distress.  NAD  HENT:  Mouth/Throat: Oropharynx is clear and moist.  Eyes: Pupils are equal, round, and reactive to light. Conjunctivae are normal.  Neck: Normal range of motion. No JVD present. No thyromegaly present.  Cardiovascular: Regular rhythm and intact distal pulses. Exam reveals no gallop and no friction rub.  Murmur heard. Pulmonary/Chest: Effort normal and breath sounds normal. No respiratory distress. He has no wheezes. He has no rales. He exhibits no tenderness.  Abdominal: Soft. Bowel sounds are normal. He exhibits no distension and no mass. There is no tenderness. There is no rebound and no guarding.  Musculoskeletal: Normal range of motion. He exhibits no edema or tenderness.  Lymphadenopathy:    He has no cervical adenopathy.  Neurological: He is alert and oriented to person, place, and time. He  has normal reflexes. No cranial nerve deficit. He exhibits normal muscle tone. He displays a negative Romberg sign. Coordination and gait normal.  Skin: Skin is warm and dry. No rash noted.  Psychiatric: He has a normal mood and affect. His behavior is normal. Judgment and thought content normal.  tachycardic w/exertion Wart R forearm   Procedure Note :     Procedure : Cryosurgery   Indication:  Wart(s)     Risks including unsuccessful procedure , bleeding, infection, bruising, scar, a need for a repeat  procedure and others were explained to the patient in detail as well as the benefits. Informed consent was obtained verbally.   1  lesion(s)  on R forearm   was/were treated with liquid nitrogen on a Q-tip in a usual fasion . Band-Aid was applied and antibiotic ointment was given for a later use.   Tolerated well. Complications none.   Postprocedure instructions :     Keep the wounds clean. You can wash them with liquid soap and water. Pat dry with gauze or a  Kleenex tissue  Before applying antibiotic ointment and a Band-Aid.   You need to report immediately  if  any signs of infection develop.    Lab Results  Component Value Date   WBC 7.5 06/14/2017   HGB 10.4 (L) 06/14/2017   HCT 32.4 (L) 06/14/2017   PLT 199 06/14/2017   GLUCOSE 89 06/14/2017   CHOL 176 06/04/2017   TRIG 113 06/04/2017   HDL 31 (L) 06/04/2017   LDLCALC 122 (H) 06/04/2017   ALT 15 (L) 06/11/2017   AST 26 06/11/2017   NA 138 06/14/2017   K 4.7 06/14/2017   CL 109 06/14/2017   CREATININE 1.67 (H) 06/14/2017   BUN 15 06/14/2017   CO2 23 06/14/2017   TSH 5.393 (H) 06/11/2017   PSA 1.13 04/24/2016   INR 1.06 06/03/2017   HGBA1C 5.6 06/04/2017    Dg Lumbar Spine 2-3 Views  Result Date: 06/11/2017 CLINICAL DATA:  Chronic back pain. EXAM: LUMBAR SPINE - 2-3 VIEW COMPARISON:  CT abdomen pelvis dated November 22, 2015. FINDINGS: Five lumbar type vertebral bodies. No acute fracture or subluxation. Vertebral  body heights are preserved. Unchanged 8-9 mm anterolisthesis at L5-S1 due to bilateral L5 pars defects. Severe disc height loss at L5-S1, also unchanged. Remaining intervertebral disc heights are preserved. Aortic atherosclerosis. IMPRESSION: 1. Unchanged severe degenerative disc disease and grade 1 anterolisthesis at L5-S1 due to bilateral L5 pars defects. Electronically Signed   By: Titus Dubin M.D.   On: 06/11/2017 17:25   US Renal  Result Date: 06/12/2017 CLINICAL DATA:  Acute kidney injury EXAM: RENAL / URINARY TRACT ULTRASOUND COMPLETE COMPARISON:  None. FINDINGS: Right Kidney: Length: 9.6 cm. Echogenicity within normal limits. No mass or hydronephrosis visualized. Left Kidney: Length: 10.4 cm. Echogenicity within normal limits. No mass or hydronephrosis visualized. Small renal cyst measures 9 x 6 x 7 mm. Bladder: Appears normal for degree of bladder distention. Prostate: Prostate is mildly enlarged measuring 3.2 x 3.0 x 5.2 cm. IMPRESSION: 1. No hydronephrosis or other focal renal abnormality. 2. Mildly enlarged prostate gland. Electronically Signed   By: Ulyses Jarred M.D.   On: 06/12/2017 06:41   Dg Chest Portable 1 View  Result Date: 06/11/2017 CLINICAL DATA:  Chest pain and bradycardia EXAM: PORTABLE CHEST 1 VIEW COMPARISON:  Jun 03, 2017 FINDINGS: There is patchy infiltrate in the left base with small left pleural effusion. There is stable apical pleural thickening bilaterally. Lungs elsewhere are clear. Heart is mildly enlarged with pulmonary vascularity normal. No adenopathy. There is aortic atherosclerosis. There is calcification in each carotid artery. No bony lesions evident. IMPRESSION: Patchy infiltrate left base with small left pleural effusion. Stable apical pleural thickening. Stable cardiomegaly. There is aortic atherosclerosis as well as extensive visualized carotid artery calcification bilaterally. Aortic Atherosclerosis (ICD10-I70.0). Electronically Signed   By: Lowella Grip  III M.D.   On: 06/11/2017 15:39    Assessment & Plan:   There are no diagnoses linked to this encounter.   No orders of the defined types were placed in this encounter.    Follow-up: No follow-ups on file.  Walker Kehr, MD

## 2017-06-17 NOTE — Patient Instructions (Signed)
Tolerated well. Complications none.   Postprocedure instructions :     Keep the wounds clean. You can wash them with liquid soap and water. Pat dry with gauze or a Kleenex tissue  Before applying antibiotic ointment and a Band-Aid.   You need to report immediately  if  any signs of infection develop.

## 2017-06-18 ENCOUNTER — Ambulatory Visit (INDEPENDENT_AMBULATORY_CARE_PROVIDER_SITE_OTHER): Payer: Medicare Other

## 2017-06-18 DIAGNOSIS — I4892 Unspecified atrial flutter: Secondary | ICD-10-CM

## 2017-06-19 ENCOUNTER — Encounter: Payer: Self-pay | Admitting: Cardiology

## 2017-06-19 DIAGNOSIS — I4892 Unspecified atrial flutter: Secondary | ICD-10-CM | POA: Diagnosis not present

## 2017-06-20 ENCOUNTER — Encounter: Payer: Self-pay | Admitting: Internal Medicine

## 2017-06-27 ENCOUNTER — Encounter: Payer: Self-pay | Admitting: Physician Assistant

## 2017-06-27 ENCOUNTER — Telehealth: Payer: Self-pay | Admitting: Cardiology

## 2017-06-27 ENCOUNTER — Ambulatory Visit (INDEPENDENT_AMBULATORY_CARE_PROVIDER_SITE_OTHER): Payer: Medicare Other | Admitting: Physician Assistant

## 2017-06-27 VITALS — BP 110/69 | HR 70 | Ht 73.0 in | Wt 188.0 lb

## 2017-06-27 DIAGNOSIS — I10 Essential (primary) hypertension: Secondary | ICD-10-CM

## 2017-06-27 DIAGNOSIS — I350 Nonrheumatic aortic (valve) stenosis: Secondary | ICD-10-CM

## 2017-06-27 DIAGNOSIS — N179 Acute kidney failure, unspecified: Secondary | ICD-10-CM

## 2017-06-27 DIAGNOSIS — I480 Paroxysmal atrial fibrillation: Secondary | ICD-10-CM

## 2017-06-27 DIAGNOSIS — R001 Bradycardia, unspecified: Secondary | ICD-10-CM

## 2017-06-27 DIAGNOSIS — Z79899 Other long term (current) drug therapy: Secondary | ICD-10-CM | POA: Diagnosis not present

## 2017-06-27 NOTE — Telephone Encounter (Signed)
Have critical EKG

## 2017-06-27 NOTE — Patient Instructions (Signed)
Medication Instructions: Your physician recommends that you cut your lipitor in half (20 mg) to see if it helps with your leg pain.     If you need a refill on your cardiac medications before your next appointment, please call your pharmacy.   Labwork: Your physician recommends that you return for lab work in: Today (BMP)    Follow-Up: Your physician wants you to keep your follow up appointment with Dr. Stanford Breed  Special Instructions:    Thank you for choosing [facility] at [facility]!!

## 2017-06-27 NOTE — Progress Notes (Signed)
Cardiology Office Note   Date:  06/27/2017   ID:  Chad [name], DOB 02-25-1931, MRN [id number]  PCP:  Cassandria Anger, MD  Cardiologist: Dr. Stanford Breed, 03/06/2017 Electrophysiologist: Dr. Leona Singleton, PA-C   Chief Complaint  Patient presents with  . Follow-up    2 weeks     History of Present Illness: Chad [name] is a 84 y.o. male with a history of A. fib, a flutter, CVA on Xarelto, COPD, GERD, HTN  Admitted 06/04-06/07 for bradycardia, AKI (Cr 4.76), hypotension. 4.5 sec pauses>>Dilt d/c'd, Norvasc added Monitor applied 06/11 Critical ECG 06/18, pt in atrial fib  Chad [name] presents for cardiology follow up.   He denies presyncope or syncope. Has not felt his heart skip or flutter, no palpitations.  He has had bad dreams 2 nights, once this week and once last week. He felt someone was trying to drag him out of bed. He got cold also. He felt better once it was daylight, but was still cold.   He has not had chest pain.   He complains of weakness.  He feels he has lost a great deal of strength.   His appetite is poor, he has not eaten lunch (2 pm). Not really hungry but will eat before he goes home.   He is compliant with his medications, but wonders why he needs so many vitamins.  Wonders if of multivitamin does not cover some of them.   Past Medical History:  Diagnosis Date  . Abnormal EKG    ST changes with normal coronaries by cath 05/06/12.  . Arthritis    "hands" (01/12/2013); feet (podiatry consultation); "back" (06/12/2017)  . Atrial flutter ([facility])    a. Slow ~100bpm when in 2:1; dx 04/2012. b. Placed on Xarelto.  c. s/p ablation 07-08-2012 by Dr Rayann Heman  . Atrial tachycardia ([place])   . Carotid artery calcification    By CXR - dopplers 05/06/12 without obvious evidence of significant ICA stenosis >40%  . Chronic lower back pain   . Colon polyps 06/26/2005  . COPD (chronic obstructive pulmonary disease) ([place])   . Diverticulosis of colon  (without mention of hemorrhage)   . Dysrhythmia   . Gastritis, chronic    Pt denies bleeding 04/2012. EGD 2012 reportedly normal.  . GERD (gastroesophageal reflux disease)   . Heart murmur   . Hypertension   . Hyponatremia    04/2012 r/t diuretic.  . IBS (irritable bowel syndrome)   . Internal and external hemorrhoids without complication   . Macular degeneration of left eye   . Pneumonia    "@ least 5 times" (06/12/2017)  . Shortness of breath dyspnea    walking distances  . Tongue cancer ([place]) 02/1997    Past Surgical History:  Procedure Laterality Date  . ATRIAL FLUTTER ABLATION N/A 07/08/2012   Procedure: ATRIAL FLUTTER ABLATION;  Surgeon: Thompson Grayer, MD;  Location: [facility] CATH LAB;  Service: Cardiovascular;  Laterality: N/A;  . CARDIAC CATHETERIZATION  04/2012  . CATARACT EXTRACTION W/ INTRAOCULAR LENS IMPLANT Left   . COLONOSCOPY  08/29/10   diverticulosis, internal hemorrhoids  . ESOPHAGOGASTRODUODENOSCOPY  09/20/10   normal  . [place]  . LEFT HEART CATHETERIZATION WITH CORONARY ANGIOGRAM N/A 05/06/2012   Procedure: LEFT HEART CATHETERIZATION WITH CORONARY ANGIOGRAM;  Surgeon: Peter M Martinique, MD;  Location: [facility] CATH LAB;  Service: Cardiovascular;  Laterality: N/A;  . 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 TRIGGER  FINGER RELEASE Right 1970's   "2 fingers"  . WART FULGURATION Left 09/15/2015   Procedure: EXCISIONal biospy of left peri anual and anual canal mass;  Surgeon: Greer Pickerel, MD;  Location: [facility] ORS;  Service: General;  Laterality: Left;    Current Outpatient Medications  Medication Sig Dispense Refill  . amLODipine (NORVASC) 2.5 MG tablet Take 1 tablet (2.5 mg total) by mouth daily. 30 tablet 0  . apixaban (ELIQUIS) 2.5 MG TABS tablet Take 1 tablet (2.5 mg total) by mouth 2 (two) times daily. 60 tablet 1  . atorvastatin (LIPITOR) 40 MG tablet Take 1 tablet (40 mg total) by mouth daily at 6 PM. 30 tablet 1  . cholecalciferol (VITAMIN D) 1000  units tablet Take 1 tablet (1,000 Units total) by mouth daily. 100 tablet 3  . clidinium-chlordiazePOXIDE (LIBRAX) 5-2.5 MG capsule TAKE 1 CAPSULE BY MOUTH TWICE DAILY 60 capsule 3  . cyanocobalamin 1000 MCG tablet Take 1,000 mcg by mouth daily.     . famotidine (PEPCID) 20 MG tablet One at bedtime (Patient taking differently: Take 20 mg by mouth at bedtime. ) 30 tablet 11  . fluticasone (FLONASE) 50 MCG/ACT nasal spray Place 2 sprays into both nostrils daily. (Patient taking differently: Place 2 sprays into both nostrils at bedtime. ) 48 g 3  . gabapentin (NEURONTIN) 300 MG capsule Take 3 capsules at bedtime. (Patient taking differently: Take 300 mg by mouth 3 (three) times daily. ) 270 capsule 3  . multivitamin-lutein (OCUVITE-LUTEIN) CAPS capsule Take 1 capsule by mouth daily.     . pantoprazole (PROTONIX) 40 MG tablet Take 1 tablet (40 mg total) by mouth daily. Take 30-60 min before first meal of the day 30 tablet 2  . polyethylene glycol powder (GLYCOLAX/MIRALAX) powder Take 17 g by mouth 2 (two) times daily as needed. 3350 g 1  . senna-docusate (SENOKOT-S) 8.6-50 MG tablet Take 1 tablet by mouth daily.    . vitamin E 100 UNIT capsule Take 100 Units by mouth daily.     No current facility-administered medications for this visit.     Allergies:   Dyazide [hydrochlorothiazide w-triamterene] and Lisinopril    Social History:  The patient  reports that he quit smoking about 20 years ago. His smoking use included cigarettes, pipe, and cigars. He has a 10.00 pack-year smoking history. He has never used smokeless tobacco. He reports that he does not drink alcohol or use drugs.   Family History:  The patient's family history includes Diabetes in his unknown relative; Emphysema in his sister and sister; Heart disease in his brother; Other in his brother, father, and mother; Throat cancer in his brother.    ROS:  Please see the history of present illness. All other systems are reviewed and  negative.    PHYSICAL EXAM: VS:  BP 110/69   Pulse 70   Ht 6\' 1"  (1.854 m)   Wt 188 lb (85.3 kg)   BMI 24.80 kg/m  , BMI Body mass index is 24.8 kg/m. GEN: Well nourished, well developed, male in no acute distress  HEENT: normal for age  Neck: no JVD, no carotid bruit, no masses Cardiac: RRR; 3/6 murmur, no rubs, or gallops Respiratory:  clear to auscultation bilaterally, normal work of breathing GI: soft, nontender, nondistended, + BS MS: no deformity or atrophy; no edema; distal pulses are 2+ in all 4 extremities   Skin: warm and dry, no rash Neuro:  Strength and sensation are intact Psych: euthymic mood, full affect  EKG:  EKG is ordered today. The ekg ordered today demonstrates sinus rhythm, heart rate 70 prolonged first-degree AV block at 362 ms, other intervals are normal.  Previous ECGs were idioventricular from his last admission, sinus rhythm with slightly prolonged PR interval 09/2016  ECHO: 06/04/2017 - Left ventricle: The cavity size was normal. There was mild   concentric hypertrophy. Systolic function was normal. The   estimated ejection fraction was in the range of 55% to 60%.   Hypokinesis of the basal-midanteroseptal and inferoseptal   myocardium. Doppler parameters are consistent with a reversible   restrictive pattern, indicative of decreased left ventricular   diastolic compliance and/or increased left atrial pressure (grade   3 diastolic dysfunction). - Aortic valve: Valve mobility was restricted. Transvalvular   velocity was within the normal range. There was no stenosis.   There was no regurgitation. Valve area (VTI): 2.75 cm^2. Valve   area (Vmax): 3.08 cm^2. Valve area (Vmean): 3.6 cm^2. - Mitral valve: Calcified annulus. Transvalvular velocity was   within the normal range. There was no evidence for stenosis.   There was mild regurgitation. - Left atrium: The atrium was mildly dilated. - Right ventricle: Systolic function was normal. - Tricuspid  valve: There was moderate-severe regurgitation. - Pulmonic valve: There was moderate regurgitation. - Pulmonary arteries: Systolic pressure was mildly increased. PA   peak pressure: 45 mm Hg (S).  Impressions:  - Visually, there appears to be severe aortic stenosis. However,   gradients across the aortic valve are consistent with mild aortic   stenosis. Consider repeating limited echo and looking at right   parasternal views, TEE or cath if clinically indicated.  Recent Labs: 04/24/2017: Pro B Natriuretic peptide (BNP) 246.0 06/11/2017: ALT 15; TSH 5.393 06/12/2017: B Natriuretic Peptide 1,385.5 06/13/2017: Magnesium 1.8 06/14/2017: BUN 15; Creatinine, Ser 1.67; Hemoglobin 10.4; Platelets 199; Potassium 4.7; Sodium 138    Lipid Panel    Component Value Date/Time   CHOL 176 06/04/2017 0626   TRIG 113 06/04/2017 0626   HDL 31 (L) 06/04/2017 0626   CHOLHDL 5.7 06/04/2017 0626   VLDL 23 06/04/2017 0626   LDLCALC 122 (H) 06/04/2017 0626     Wt Readings from Last 3 Encounters:  06/27/17 188 lb (85.3 kg)  06/17/17 192 lb (87.1 kg)  06/14/17 188 lb 8 oz (85.5 kg)     Other studies Reviewed: Additional studies/ records that were reviewed today include: Office notes, hospital records and testing.  ASSESSMENT AND PLAN:  1.  PAF: He was unaware of the atrial fibrillation.  He is currently in sinus rhythm.  When he was in atrial fibrillation, the rate was controlled.  No med changes, continue Eliquis for anticoagulation.  2.  Prolonged PR interval: His PR interval has not been this long before.  Upon review of telemetry, it was similar to this prior to discharge.  Continue to follow.  3.  Bradycardia and pauses: He is currently in sinus rhythm and has had no pauses on the event monitor.  Continue to wear it.  4.  Hypertension: His blood pressure is well controlled, no change in amlodipine dosage  5.  Acute renal failure on CKD: Peak creatinine 4.76 during his last admission, recheck  today  6.  Aortic stenosis: Echo report as above, he is having no symptoms related to this.  Continue to follow.   Current medicines are reviewed at length with the patient today.  The patient does not have concerns regarding medicines.  The following changes have  been made:  no change  Labs/ tests ordered today include:   Orders Placed This Encounter  Procedures  . Basic metabolic panel  . EKG 12-Lead     Disposition:   FU with Dr. Stanford Breed  Signed, Rosaria Ferries, PA-C  06/27/2017 6:39 PM    [place] Phone: [phone]; Fax: [phone]  This note was written with the assistance of speech recognition software. Please excuse any transcriptional errors.

## 2017-06-27 NOTE — Telephone Encounter (Signed)
Strips arrived to office for review.  Strips given to Dr Stanford Breed

## 2017-06-27 NOTE — Telephone Encounter (Signed)
SPOKE TO [facility]. FRANZ- COURTESY CALL  INFORMED RN THAT A STRIP ON 06/25/17 SHOWED AFIB. STRIP WILL BE FAXED FOR REVIEW.

## 2017-06-27 NOTE — Telephone Encounter (Signed)
Information was given to [facility] , instead of Dr Stanford Breed. PATIENT is being seen at present time- office visit with Suanne Marker

## 2017-06-28 LAB — BASIC METABOLIC PANEL
BUN / CREAT RATIO: 10 (ref 10–24)
BUN: 17 mg/dL (ref 8–27)
CO2: 23 mmol/L (ref 20–29)
CREATININE: 1.72 mg/dL — AB (ref 0.76–1.27)
Calcium: 8.8 mg/dL (ref 8.6–10.2)
Chloride: 100 mmol/L (ref 96–106)
GFR, EST AFRICAN AMERICAN: 41 mL/min/{1.73_m2} — AB (ref >59)
GFR, EST NON AFRICAN AMERICAN: 36 mL/min/{1.73_m2} — AB (ref >59)
Glucose: 91 mg/dL (ref 65–99)
POTASSIUM: 4.8 mmol/L (ref 3.5–5.2)
SODIUM: 136 mmol/L (ref 134–144)

## 2017-07-01 ENCOUNTER — Other Ambulatory Visit: Payer: Medicare Other

## 2017-07-02 ENCOUNTER — Telehealth: Payer: Self-pay

## 2017-07-02 NOTE — Telephone Encounter (Signed)
Received a Strip from [place] stating pt had an episode of sustained afib with a HR of 70. Reviewed by DOD Dr. Gwenlyn Found who confirmed afib. Pt is currently anticoagulated.

## 2017-07-05 ENCOUNTER — Telehealth: Payer: Self-pay | Admitting: Internal Medicine

## 2017-07-05 NOTE — Telephone Encounter (Signed)
Copied from [place] [id number]. Topic: Quick Communication - Rx Refill/Question >> Jul 05, 2017  4:19 PM Waylan Rocher, Lumin L wrote: Medication: amLODipine (NORVASC) 2.5 MG tablet   Has the patient contacted their pharmacy? Yes.   (Agent: If no, request that the patient contact the pharmacy for the refill.) (Agent: If yes, when and what did the pharmacy advise?)  Preferred Pharmacy (with phone number or street name): [place], [place] [address] Phone: [phone] Fax: [phone]    Agent: Please be advised that RX refills may take up to 3 business days. We ask that you follow-up with your pharmacy.

## 2017-07-06 NOTE — Telephone Encounter (Signed)
Called and informed pt that Amlodipine is being filled by his cardiologist and not PCP. Pt verbalized understanding and states he has appt with cardiologist on Tuesday and will let the the cardiologist know he needs a refill.

## 2017-07-09 ENCOUNTER — Telehealth: Payer: Self-pay | Admitting: Physician Assistant

## 2017-07-09 ENCOUNTER — Encounter: Payer: Self-pay | Admitting: Adult Health

## 2017-07-09 ENCOUNTER — Ambulatory Visit (INDEPENDENT_AMBULATORY_CARE_PROVIDER_SITE_OTHER): Payer: Medicare Other | Admitting: Adult Health

## 2017-07-09 VITALS — BP 113/67 | HR 84 | Ht 73.0 in | Wt 190.0 lb

## 2017-07-09 DIAGNOSIS — I10 Essential (primary) hypertension: Secondary | ICD-10-CM | POA: Diagnosis not present

## 2017-07-09 DIAGNOSIS — I63411 Cerebral infarction due to embolism of right middle cerebral artery: Secondary | ICD-10-CM

## 2017-07-09 DIAGNOSIS — E7849 Other hyperlipidemia: Secondary | ICD-10-CM | POA: Diagnosis not present

## 2017-07-09 DIAGNOSIS — I4892 Unspecified atrial flutter: Secondary | ICD-10-CM

## 2017-07-09 MED ORDER — AMLODIPINE BESYLATE 2.5 MG PO TABS: 2.5000 mg | ORAL_TABLET | Freq: Every day | ORAL | 5 refills | Status: DC

## 2017-07-09 NOTE — Progress Notes (Signed)
[facility] [address] [place]. [place] [address] [id number]       OFFICE FOLLOW UP NOTE  Chad [name] Date of Birth:  [birth year] Medical Record Number:  [id number]   Reason for Referral:  hospital stroke follow up  CHIEF COMPLAINT:  Chief Complaint  Patient presents with  . Follow-up    Stroke hospital follow up room 9 patient is alone    HPI: Chad [name] is being seen today for initial visit in the office for right MCA infarct on 06/03/2017. History obtained from patient and chart review. Reviewed all radiology images and labs personally.  Chad [name] is a 84 y.o. male with history of AF s/p ablation in 2014 not on AC since that time, HTN, HLD, COPD, GERD, oral cancer, OS macular degeneration, CKD and former smoker who presented with left arm and hand weakness.  CT head reviewed was negative for acute abnormality but did show atrophy, old right frontal infarct and paranasal sinus disease.  MRI head reviewed and showed right MCA precentral posterior frontal cortex and subcortical white matter along with small vessel disease.  MRA showed hypoplastic left A1 ACA.  Carotid Doppler showed bilateral ICA stenosis of 1 to 39% and VAC antegrade.  2D echo showed an EF of 55 to 60% without cardiac source of embolus.  LDL 122 and as patient was not on statin PTA it was recommended to start Lipitor 40 mg daily.  A1c satisfactory at 5.6.  Patient was not on antithrombotic prior to admission and given AF history it was recommended lifelong anticoagulation for secondary stroke prevention such as Eliquis.  Therapies recommended home PT and was discharged in stable condition.  Patient is being seen for hospital follow-up and overall doing well.  He states all symptoms have resolved and has no residual deficit.  He continues to take Eliquis without side effects of bleeding or bruising.  Continues to take Lipitor without side effects myalgias.  He did not undergo home PT  as recommended during hospitalization but does state that he does his own home exercises.  He feels as though right now he does not need PT/OT.  Blood pressure today satisfactory 113/67.  He is currently wearing a heart monitor due to hospital admission on 06/08/2017 for bradycardia.  This heart monitor is a 30-day study and he will return this on 07/18/2017.  He is using his cane for ambulation but overall ambulating well.  He currently lives with his ex-wife but does not need additional assistance from outside sources.  Denies new or worsening stroke/TIA symptoms at this time.  ROS:   14 system review of systems performed and negative with exception of no complaints  PMH:  Past Medical History:  Diagnosis Date  . Abnormal EKG    ST changes with normal coronaries by cath 05/06/12.  . Arthritis    "hands" (01/12/2013); feet (podiatry consultation); "back" (06/12/2017)  . Atrial flutter ([place])    a. Slow ~100bpm when in 2:1; dx 04/2012. b. Placed on Xarelto.  c. s/p ablation 07-08-2012 by Dr Rayann Heman  . Atrial tachycardia ([place])   . Carotid artery calcification    By CXR - dopplers 05/06/12 without obvious evidence of significant ICA stenosis >40%  . Chronic lower back pain   . Colon polyps 06/26/2005  . COPD (chronic obstructive pulmonary disease) ([place])   . Diverticulosis of colon (without mention of hemorrhage)   . Dysrhythmia   . Gastritis, chronic    Pt denies  bleeding 04/2012. EGD 2012 reportedly normal.  . GERD (gastroesophageal reflux disease)   . Heart murmur   . Hypertension   . Hyponatremia    04/2012 r/t diuretic.  . IBS (irritable bowel syndrome)   . Internal and external hemorrhoids without complication   . Macular degeneration of left eye   . Pneumonia    "@ least 5 times" (06/12/2017)  . Shortness of breath dyspnea    walking distances  . Stroke ([place])   . Tongue cancer ([place]) 02/1997    PSH:  Past Surgical History:  Procedure Laterality Date  . ATRIAL FLUTTER ABLATION N/A 07/08/2012    Procedure: ATRIAL FLUTTER ABLATION;  Surgeon: Thompson Grayer, MD;  Location: [facility] CATH LAB;  Service: Cardiovascular;  Laterality: N/A;  . CARDIAC CATHETERIZATION  04/2012  . CATARACT EXTRACTION W/ INTRAOCULAR LENS IMPLANT Left   . COLONOSCOPY  08/29/10   diverticulosis, internal hemorrhoids  . ESOPHAGOGASTRODUODENOSCOPY  09/20/10   normal  . [place]  . LEFT HEART CATHETERIZATION WITH CORONARY ANGIOGRAM N/A 05/06/2012   Procedure: LEFT HEART CATHETERIZATION WITH CORONARY ANGIOGRAM;  Surgeon: Peter M Martinique, MD;  Location: [facility] CATH LAB;  Service: Cardiovascular;  Laterality: N/A;  . 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 TRIGGER FINGER RELEASE Right 1970's   "2 fingers"  . WART FULGURATION Left 09/15/2015   Procedure: EXCISIONal biospy of left peri anual and anual canal mass;  Surgeon: Greer Pickerel, MD;  Location: [facility] ORS;  Service: General;  Laterality: Left;    Social History:  Social History   Socioeconomic History  . Marital status: Divorced    Spouse name: Not on file  . Number of children: 2  . Years of education: 61  . Highest education level: Not on file  Occupational History  . Occupation: Retired  Scientific laboratory technician  . Financial resource strain: Not on file  . Food insecurity:    Worry: Not on file    Inability: Not on file  . Transportation needs:    Medical: Not on file    Non-medical: Not on file  Tobacco Use  . Smoking status: Former Smoker    Packs/day: 0.50    Years: 20.00    Pack years: 10.00    Types: Cigarettes, Pipe, Cigars    Last attempt to quit: 12/08/1996    Years since quitting: 20.5  . Smokeless tobacco: Never Used  . Tobacco comment:    Substance and Sexual Activity  . Alcohol use: No    Alcohol/week: 0.0 oz    Frequency: Never    Comment: last since 1998  . Drug use: Never  . Sexual activity: Not Currently  Lifestyle  . Physical activity:    Days per week: Not on file    Minutes per session: Not on file  . Stress:  Not on file  Relationships  . Social connections:    Talks on phone: Not on file    Gets together: Not on file    Attends religious service: Not on file    Active member of club or organization: Not on file    Attends meetings of clubs or organizations: Not on file    Relationship status: Not on file  . Intimate partner violence:    Fear of current or ex partner: Not on file    Emotionally abused: Not on file    Physically abused: Not on file    Forced sexual activity: Not on file  Other Topics Concern  .  Not on file  Social History Narrative   Marital status: divorced; lives with ex-wife; not dating in 2017.      Children:  2 sons, 2 grandsons, 1 granddaughter; no gg.      Lives: with ex-wife in house.      Employment: retired first time age 68; retired in 1997.  Barista x 25 years; Actor.      Tobacco: quit smoking 1997; smoked for 27 years.      Alcohol: quit 2004      Exercise:  Walking daily short distances.       ADLs:  NO assistant devices; has cane if needs it; drives; pays bill; grocery shopping by wife; wife cleans house and does laundry.     Advanced Directives:  +LIVING WILL; +FULL CODE.  HCPOA: oldest son Chad Paris Sr.)    Family History:  Family History  Problem Relation Age of Onset  . Heart disease Brother   . Throat cancer Brother   . Other Mother        UNSURE  . Other Father        UNSURE  . Emphysema Sister   . Emphysema Sister   . Other Brother        UNSURE  . Diabetes Unknown     Medications:   Current Outpatient Medications on File Prior to Visit  Medication Sig Dispense Refill  . amLODipine (NORVASC) 2.5 MG tablet Take 1 tablet (2.5 mg total) by mouth daily. 30 tablet 0  . apixaban (ELIQUIS) 2.5 MG TABS tablet Take 1 tablet (2.5 mg total) by mouth 2 (two) times daily. 60 tablet 1  . atorvastatin (LIPITOR) 40 MG tablet Take 1 tablet (40 mg total) by mouth daily at 6 PM. 30 tablet 1  . cholecalciferol (VITAMIN D)  1000 units tablet Take 1 tablet (1,000 Units total) by mouth daily. 100 tablet 3  . clidinium-chlordiazePOXIDE (LIBRAX) 5-2.5 MG capsule TAKE 1 CAPSULE BY MOUTH TWICE DAILY 60 capsule 3  . cyanocobalamin 1000 MCG tablet Take 1,000 mcg by mouth daily.     . famotidine (PEPCID) 20 MG tablet One at bedtime (Patient taking differently: Take 20 mg by mouth at bedtime. ) 30 tablet 11  . fluticasone (FLONASE) 50 MCG/ACT nasal spray Place 2 sprays into both nostrils daily. (Patient taking differently: Place 2 sprays into both nostrils at bedtime. ) 48 g 3  . gabapentin (NEURONTIN) 300 MG capsule Take 3 capsules at bedtime. (Patient taking differently: Take 300 mg by mouth 3 (three) times daily. ) 270 capsule 3  . multivitamin-lutein (OCUVITE-LUTEIN) CAPS capsule Take 1 capsule by mouth daily.     . pantoprazole (PROTONIX) 40 MG tablet Take 1 tablet (40 mg total) by mouth daily. Take 30-60 min before first meal of the day 30 tablet 2  . polyethylene glycol powder (GLYCOLAX/MIRALAX) powder Take 17 g by mouth 2 (two) times daily as needed. 3350 g 1  . senna-docusate (SENOKOT-S) 8.6-50 MG tablet Take 1 tablet by mouth daily.    . vitamin E 100 UNIT capsule Take 100 Units by mouth daily.     No current facility-administered medications on file prior to visit.     Allergies:   Allergies  Allergen Reactions  . Dyazide [Hydrochlorothiazide W-Triamterene] Other (See Comments)    Caused hyponatremia 04/2012  . Lisinopril Other (See Comments)    cough     Physical Exam  Vitals:   07/09/17 1347  BP: 113/67  Pulse: 84  Weight: 190 lb (86.2 kg)  Height: 6\' 1"  (1.854 m)   Body mass index is 25.07 kg/m. No exam data present  General: Frail elderly pleasant male, seated, in no evident distress Head: head normocephalic and atraumatic.   Neck: supple with no carotid or supraclavicular bruits Cardiovascular: regular rate and rhythm, no murmurs Musculoskeletal: no deformity Skin:  no  rash/petichiae Vascular:  Normal pulses all extremities  Neurologic Exam Mental Status: Awake and fully alert. Oriented to place and time. Recent and remote memory intact. Attention span, concentration and fund of knowledge appropriate. Mood and affect appropriate.  Cranial Nerves: Fundoscopic exam reveals sharp disc margins. Pupils equal, briskly reactive to light. Extraocular movements full without nystagmus. Visual fields full to confrontation. Hearing intact. Facial sensation intact. Face, tongue, palate moves normally and symmetrically.  Motor: Normal bulk and tone. Normal strength in all tested extremity muscles. Sensory.: intact to touch , pinprick , position and vibratory sensation.  Coordination: Rapid alternating movements normal in all extremities. Finger-to-nose and heel-to-shin performed accurately bilaterally. Gait and Station: Arises from chair without difficulty. Stance is normal. Gait demonstrates normal stride length and balance with assistance of cane.  Reflexes: 1+ and symmetric. Toes downgoing.    NIHSS  0 Modified Rankin  1 HAS-BLED 3 CHA2DS2-VASc 6   Diagnostic Data (Labs, Imaging, Testing)  CT HEAD WO CONTRAST 06/03/2017 IMPRESSION: Atrophy with periventricular small vessel disease. Prior infarct medial superior right frontal lobe. No acute infarct demonstrable. No mass or hemorrhage evident. Foci arterial vascular calcification. Areas of paranasal sinus disease. Old healed fracture right nasal bone.  MR BRAIN WO CONTRAST MR MRA  HEAD WO CONTRAST 06/03/2017 IMPRESSION: Acute RIGHT MCA territory infarct, nonhemorrhagic, affecting the precentral posterior frontal cortex and subcortical white matter. The location would correlate with symptoms of LEFT arm weakness. Atrophy and small vessel disease.  Old RIGHT ACA infarct. MRA demonstrating no ICA or MCA abnormality of significance. Severely diseased hypoplastic LEFT A1 ACA, incidental  finding.  ECHOCARDIOGRAM 06/04/17 Impressions: - Visually, there appears to be severe aortic stenosis. However,   gradients across the aortic valve are consistent with mild aortic   stenosis. Consider repeating limited echo and looking at right   parasternal views, TEE or cath if clinically indicated.     ASSESSMENT: Chad [name] is a 84 y.o. year old male here with right MCA infarct on 06/03/17 secondary to known atrial fibrillation. Vascular risk factors include AF, HLD, HTN.     PLAN: -Continue Eliquis (apixaban) daily  and Lipitor for secondary stroke prevention -F/u with PCP regarding your HLD and HTN management -f/u with cardiologist regarding atrial fibrillation and Eliquis management -Continue home exercises -continue to monitor BP at home -Continue to stay active and eat healthy -Maintain strict control of hypertension with blood pressure goal below 130/90, diabetes with hemoglobin A1c goal below 6.5% and cholesterol with LDL cholesterol (bad cholesterol) goal below 70 mg/dL. I also advised the patient to eat a healthy diet with plenty of whole grains, cereals, fruits and vegetables, exercise regularly and maintain ideal body weight.  Follow up in 6 months or call earlier if needed   Greater than 50% of time during this 25 minute visit was spent on counseling,explanation of diagnosis of right MCA infarct, reviewing risk factor management of AF, HLD and HTN, planning of further management, discussion with patient and family and coordination of care    Venancio Poisson, [facility]  [facility] [address] [place], [place] [address]  Phone [phone]  Fax [phone]

## 2017-07-09 NOTE — Patient Instructions (Signed)
Continue Eliquis (apixaban) daily  and lipitor  for secondary stroke prevention  Continue to follow up with PCP regarding cholesterol and blood pressure management   Continue to follow up with cardiologist regarding atrial fibrillation and eliquis management   Continue to monitor blood pressure at home  Maintain strict control of hypertension with blood pressure goal below 130/90, diabetes with hemoglobin A1c goal below 6.5% and cholesterol with LDL cholesterol (bad cholesterol) goal below 70 mg/dL. I also advised the patient to eat a healthy diet with plenty of whole grains, cereals, fruits and vegetables, exercise regularly and maintain ideal body weight.  Followup in the future with me in 6 months or call earlier if needed       Thank you for coming to see [place] at [facility]. I hope we have been able to provide you high quality care today.  You may receive a patient satisfaction survey over the next few weeks. We would appreciate your feedback and comments so that we may continue to improve ourselves and the health of our patients.

## 2017-07-09 NOTE — Telephone Encounter (Signed)
New Message    *STAT* If patient is at the pharmacy, call can be transferred to refill team.   1. Which medications need to be refilled? (please list name of each medication and dose if known) amLODipine (NORVASC) 2.5 MG tablet  2. Which pharmacy/location (including street and city if local pharmacy) is medication to be sent to? [facility], [address]  3. Do they need a 30 day or 90 day supply? [place]

## 2017-07-15 NOTE — Progress Notes (Signed)
I agree with the above plan 

## 2017-07-16 ENCOUNTER — Telehealth: Payer: Self-pay | Admitting: Internal Medicine

## 2017-07-16 NOTE — Telephone Encounter (Signed)
Received call patient pushed the passed out button on monitor.  Rhythms benign, NSR.  Attempted to contact patient, without response.  Will attempt again.   Zorita Pang, MD Cardiology

## 2017-07-17 ENCOUNTER — Telehealth: Payer: Self-pay | Admitting: Adult Health

## 2017-07-17 NOTE — Telephone Encounter (Signed)
Left message to call back  Strips received from [employer] and given to Luisa Dago RN with Dr Stanford Breed

## 2017-07-17 NOTE — Telephone Encounter (Signed)
I did not order any labs on patient during visit. I also do not see any labs that were obtained. Please advise. Thank you.

## 2017-07-17 NOTE — Telephone Encounter (Signed)
Pt states he had blood drawn on 07-02 but hasnt heard anything about the results of the bloodwork.  Pt is asking for a call back

## 2017-07-17 NOTE — Telephone Encounter (Signed)
Called the patient and made him aware that his visit on 7/2 was just a regular office visit. There was no lab work of procedures that were ordered during that visit. The patient states he was concerned about kidney levels. I reviewed his chart and informed him that Rosaria Ferries ordered lab work to assess that on 6/20 and appeared he was called with those results on 6/26. I did review those results once more. Encouraged to make sure patient was drinking plenty of fluids and to follow up with PCP. Pt verbalized understanding and was appreciative for the call back.

## 2017-07-23 ENCOUNTER — Other Ambulatory Visit: Payer: Self-pay | Admitting: *Deleted

## 2017-07-23 DIAGNOSIS — R001 Bradycardia, unspecified: Secondary | ICD-10-CM

## 2017-07-24 ENCOUNTER — Telehealth: Payer: Self-pay | Admitting: Cardiology

## 2017-07-24 NOTE — Telephone Encounter (Signed)
Patient needs appointment with Dr. Curt Bears per Dr. Jacalyn Lefevre results note. See note below from monitor results. Informed patient that someone will call him to schedule an appointment. Patient verbalized understanding. Will send message to scheduling.   Notes recorded by Lelon Perla, MD on 07/23/2017 at 12:12 PM EDT Please arrange fu with Dr Curt Bears; may need pacemaker Kirk Ruths

## 2017-07-24 NOTE — Telephone Encounter (Signed)
Follow up    Patient is calling because he is confused as to a phone call that he received. He states that he was told that he needed a pacemaker after his holter monitor results. Please call to discuss.

## 2017-07-24 NOTE — Telephone Encounter (Signed)
New message: ° ° ° ° ° ° °Pt is returning a call °

## 2017-07-25 NOTE — Telephone Encounter (Signed)
Patient has MD OV on 08/14/17 with Elliot Cousin MD

## 2017-07-29 ENCOUNTER — Encounter: Payer: Self-pay | Admitting: Internal Medicine

## 2017-07-29 ENCOUNTER — Ambulatory Visit (INDEPENDENT_AMBULATORY_CARE_PROVIDER_SITE_OTHER): Payer: Medicare Other | Admitting: Internal Medicine

## 2017-07-29 DIAGNOSIS — I5032 Chronic diastolic (congestive) heart failure: Secondary | ICD-10-CM | POA: Diagnosis not present

## 2017-07-29 DIAGNOSIS — I4892 Unspecified atrial flutter: Secondary | ICD-10-CM | POA: Diagnosis not present

## 2017-07-29 DIAGNOSIS — J449 Chronic obstructive pulmonary disease, unspecified: Secondary | ICD-10-CM | POA: Diagnosis not present

## 2017-07-29 DIAGNOSIS — I639 Cerebral infarction, unspecified: Secondary | ICD-10-CM | POA: Diagnosis not present

## 2017-07-29 DIAGNOSIS — J4489 Other specified chronic obstructive pulmonary disease: Secondary | ICD-10-CM

## 2017-07-29 MED ORDER — PROMETHAZINE-CODEINE 6.25-10 MG/5ML PO SYRP: 5.0000 mL | ORAL_SOLUTION | ORAL | 0 refills | Status: DC | PRN

## 2017-07-29 MED ORDER — CEFUROXIME AXETIL 250 MG PO TABS: 250.0000 mg | ORAL_TABLET | Freq: Two times a day (BID) | ORAL | 0 refills | Status: DC

## 2017-07-29 MED ORDER — ACLIDINIUM BROMIDE 400 MCG/ACT IN AEPB: 1.0000 | INHALATION_SPRAY | Freq: Two times a day (BID) | RESPIRATORY_TRACT | 3 refills | Status: DC

## 2017-07-29 NOTE — Progress Notes (Signed)
Subjective:  Patient ID: Chad [name], male    DOB: Jan 16, 1931  Age: 84 y.o. MRN: [id number]  CC: No chief complaint on file.   HPI Chad [name] presents for A fib, CHF, OA f/u C/o bad productive for brown sputum cough x 2 weeks. Worse at night. No SOB. No fever  Outpatient Medications Prior to Visit  Medication Sig Dispense Refill  . amLODipine (NORVASC) 2.5 MG tablet Take 1 tablet (2.5 mg total) by mouth daily. 30 tablet 5  . apixaban (ELIQUIS) 2.5 MG TABS tablet Take 1 tablet (2.5 mg total) by mouth 2 (two) times daily. 60 tablet 1  . atorvastatin (LIPITOR) 40 MG tablet Take 1 tablet (40 mg total) by mouth daily at 6 PM. 30 tablet 1  . cholecalciferol (VITAMIN D) 1000 units tablet Take 1 tablet (1,000 Units total) by mouth daily. 100 tablet 3  . clidinium-chlordiazePOXIDE (LIBRAX) 5-2.5 MG capsule TAKE 1 CAPSULE BY MOUTH TWICE DAILY 60 capsule 3  . cyanocobalamin 1000 MCG tablet Take 1,000 mcg by mouth daily.     . famotidine (PEPCID) 20 MG tablet One at bedtime (Patient taking differently: Take 20 mg by mouth at bedtime. ) 30 tablet 11  . fluticasone (FLONASE) 50 MCG/ACT nasal spray Place 2 sprays into both nostrils daily. (Patient taking differently: Place 2 sprays into both nostrils at bedtime. ) 48 g 3  . gabapentin (NEURONTIN) 300 MG capsule Take 3 capsules at bedtime. (Patient taking differently: Take 300 mg by mouth 3 (three) times daily. ) 270 capsule 3  . multivitamin-lutein (OCUVITE-LUTEIN) CAPS capsule Take 1 capsule by mouth daily.     . pantoprazole (PROTONIX) 40 MG tablet Take 1 tablet (40 mg total) by mouth daily. Take 30-60 min before first meal of the day 30 tablet 2  . polyethylene glycol powder (GLYCOLAX/MIRALAX) powder Take 17 g by mouth 2 (two) times daily as needed. 3350 g 1  . senna-docusate (SENOKOT-S) 8.6-50 MG tablet Take 1 tablet by mouth daily.    . vitamin E 100 UNIT capsule Take 100 Units by mouth daily.     No facility-administered medications  prior to visit.     ROS: Review of Systems  Constitutional: Negative for appetite change, fatigue and unexpected weight change.  HENT: Negative for congestion, nosebleeds, sneezing, sore throat and trouble swallowing.   Eyes: Negative for itching and visual disturbance.  Respiratory: Positive for cough. Negative for shortness of breath.   Cardiovascular: Negative for chest pain, palpitations and leg swelling.  Gastrointestinal: Negative for abdominal distention, blood in stool, diarrhea and nausea.  Genitourinary: Negative for frequency and hematuria.  Musculoskeletal: Negative for back pain, gait problem, joint swelling and neck pain.  Skin: Negative for rash.  Neurological: Negative for dizziness, tremors, speech difficulty and weakness.  Psychiatric/Behavioral: Negative for agitation, dysphoric mood and sleep disturbance. The patient is not nervous/anxious.     Objective:  BP 120/72 (BP Location: Left Arm, Patient Position: Sitting, Cuff Size: Normal)   Pulse 71   Temp 98 F (36.7 C) (Oral)   Ht 6\' 1"  (1.854 m)   Wt 184 lb (83.5 kg)   SpO2 94%   BMI 24.28 kg/m   BP Readings from Last 3 Encounters:  07/29/17 120/72  07/09/17 113/67  06/27/17 110/69    Wt Readings from Last 3 Encounters:  07/29/17 184 lb (83.5 kg)  07/09/17 190 lb (86.2 kg)  06/27/17 188 lb (85.3 kg)    Physical Exam  Constitutional: He is oriented to  person, place, and time. He appears well-developed. No distress.  NAD  HENT:  Mouth/Throat: Oropharynx is clear and moist.  Eyes: Pupils are equal, round, and reactive to light. Conjunctivae are normal.  Neck: Normal range of motion. No JVD present. No thyromegaly present.  Cardiovascular: Normal rate, regular rhythm, normal heart sounds and intact distal pulses. Exam reveals no gallop and no friction rub.  No murmur heard. Pulmonary/Chest: Effort normal and breath sounds normal. No respiratory distress. He has no wheezes. He has no rales. He exhibits  no tenderness.  Abdominal: Soft. Bowel sounds are normal. He exhibits no distension and no mass. There is no tenderness. There is no rebound and no guarding.  Musculoskeletal: Normal range of motion. He exhibits no edema or tenderness.  Lymphadenopathy:    He has no cervical adenopathy.  Neurological: He is alert and oriented to person, place, and time. He has normal reflexes. No cranial nerve deficit. He exhibits normal muscle tone. He displays a negative Romberg sign. Coordination and gait normal.  Skin: Skin is warm and dry. No rash noted.  Psychiatric: He has a normal mood and affect. His behavior is normal. Judgment and thought content normal.  coughing a lot Decreased BS at bases B Kasandra Knudsen   I personally provided [place] inhaler use teaching. After the teaching patient was able to demonstrate it's use effectively. All questions were answered  Lab Results  Component Value Date   WBC 7.5 06/14/2017   HGB 10.4 (L) 06/14/2017   HCT 32.4 (L) 06/14/2017   PLT 199 06/14/2017   GLUCOSE 91 06/27/2017   CHOL 176 06/04/2017   TRIG 113 06/04/2017   HDL 31 (L) 06/04/2017   LDLCALC 122 (H) 06/04/2017   ALT 15 (L) 06/11/2017   AST 26 06/11/2017   NA 136 06/27/2017   K 4.8 06/27/2017   CL 100 06/27/2017   CREATININE 1.72 (H) 06/27/2017   BUN 17 06/27/2017   CO2 23 06/27/2017   TSH 5.393 (H) 06/11/2017   PSA 1.13 04/24/2016   INR 1.06 06/03/2017   HGBA1C 5.6 06/04/2017    Dg Lumbar Spine 2-3 Views  Result Date: 06/11/2017 CLINICAL DATA:  Chronic back pain. EXAM: LUMBAR SPINE - 2-3 VIEW COMPARISON:  CT abdomen pelvis dated November 22, 2015. FINDINGS: Five lumbar type vertebral bodies. No acute fracture or subluxation. Vertebral body heights are preserved. Unchanged 8-9 mm anterolisthesis at L5-S1 due to bilateral L5 pars defects. Severe disc height loss at L5-S1, also unchanged. Remaining intervertebral disc heights are preserved. Aortic atherosclerosis. IMPRESSION: 1. Unchanged severe  degenerative disc disease and grade 1 anterolisthesis at L5-S1 due to bilateral L5 pars defects. Electronically Signed   By: Titus Dubin M.D.   On: 06/11/2017 17:25   US Renal  Result Date: 06/12/2017 CLINICAL DATA:  Acute kidney injury EXAM: RENAL / URINARY TRACT ULTRASOUND COMPLETE COMPARISON:  None. FINDINGS: Right Kidney: Length: 9.6 cm. Echogenicity within normal limits. No mass or hydronephrosis visualized. Left Kidney: Length: 10.4 cm. Echogenicity within normal limits. No mass or hydronephrosis visualized. Small renal cyst measures 9 x 6 x 7 mm. Bladder: Appears normal for degree of bladder distention. Prostate: Prostate is mildly enlarged measuring 3.2 x 3.0 x 5.2 cm. IMPRESSION: 1. No hydronephrosis or other focal renal abnormality. 2. Mildly enlarged prostate gland. Electronically Signed   By: Ulyses Jarred M.D.   On: 06/12/2017 06:41   Dg Chest Portable 1 View  Result Date: 06/11/2017 CLINICAL DATA:  Chest pain and bradycardia EXAM: PORTABLE CHEST 1  VIEW COMPARISON:  Jun 03, 2017 FINDINGS: There is patchy infiltrate in the left base with small left pleural effusion. There is stable apical pleural thickening bilaterally. Lungs elsewhere are clear. Heart is mildly enlarged with pulmonary vascularity normal. No adenopathy. There is aortic atherosclerosis. There is calcification in each carotid artery. No bony lesions evident. IMPRESSION: Patchy infiltrate left base with small left pleural effusion. Stable apical pleural thickening. Stable cardiomegaly. There is aortic atherosclerosis as well as extensive visualized carotid artery calcification bilaterally. Aortic Atherosclerosis (ICD10-I70.0). Electronically Signed   By: Lowella Grip III M.D.   On: 06/11/2017 15:39    Assessment & Plan:   There are no diagnoses linked to this encounter.   No orders of the defined types were placed in this encounter.    Follow-up: No follow-ups on file.  Walker Kehr, MD

## 2017-07-29 NOTE — Assessment & Plan Note (Addendum)
6/19 CXR IMPRESSION: Patchy infiltrate left base with small left pleural effusion. Stable apical pleural thickening.  Stable cardiomegaly. There is aortic atherosclerosis as well as extensive visualized carotid artery calcification bilaterally.  Aortic Atherosclerosis (ICD10-I70.0).   Electronically Signed   By: Lowella Grip III M.D.   On: 06/11/2017 15:39  Start Ceftin Tudorza qd

## 2017-07-29 NOTE — Assessment & Plan Note (Signed)
CXR Eliquis

## 2017-07-29 NOTE — Assessment & Plan Note (Signed)
Eliquis 

## 2017-08-01 ENCOUNTER — Encounter (HOSPITAL_COMMUNITY): Payer: Self-pay

## 2017-08-01 ENCOUNTER — Inpatient Hospital Stay (HOSPITAL_COMMUNITY)
Admission: EM | Disposition: A | Discharge status: Home Health | Payer: Self-pay | Source: Home / Self Care | Attending: Cardiology

## 2017-08-01 ENCOUNTER — Inpatient Hospital Stay (HOSPITAL_COMMUNITY): Payer: Medicare Other

## 2017-08-01 ENCOUNTER — Emergency Department (HOSPITAL_COMMUNITY): Payer: Medicare Other

## 2017-08-01 ENCOUNTER — Other Ambulatory Visit: Payer: Self-pay

## 2017-08-01 ENCOUNTER — Inpatient Hospital Stay (HOSPITAL_COMMUNITY)
Admission: EM | Admit: 2017-08-01 | Discharge: 2017-08-02 | DRG: 242 | Disposition: A | Discharge status: Home Health | Payer: Medicare Other | Attending: Cardiology | Admitting: Cardiology

## 2017-08-01 DIAGNOSIS — M479 Spondylosis, unspecified: Secondary | ICD-10-CM | POA: Diagnosis present

## 2017-08-01 DIAGNOSIS — M19072 Primary osteoarthritis, left ankle and foot: Secondary | ICD-10-CM | POA: Diagnosis not present

## 2017-08-01 DIAGNOSIS — Z961 Presence of intraocular lens: Secondary | ICD-10-CM | POA: Diagnosis present

## 2017-08-01 DIAGNOSIS — R001 Bradycardia, unspecified: Secondary | ICD-10-CM | POA: Diagnosis not present

## 2017-08-01 DIAGNOSIS — N184 Chronic kidney disease, stage 4 (severe): Secondary | ICD-10-CM | POA: Diagnosis not present

## 2017-08-01 DIAGNOSIS — J440 Chronic obstructive pulmonary disease with acute lower respiratory infection: Secondary | ICD-10-CM | POA: Diagnosis present

## 2017-08-01 DIAGNOSIS — R296 Repeated falls: Secondary | ICD-10-CM | POA: Diagnosis not present

## 2017-08-01 DIAGNOSIS — Z888 Allergy status to other drugs, medicaments and biological substances status: Secondary | ICD-10-CM

## 2017-08-01 DIAGNOSIS — Z8673 Personal history of transient ischemic attack (TIA), and cerebral infarction without residual deficits: Secondary | ICD-10-CM | POA: Diagnosis not present

## 2017-08-01 DIAGNOSIS — J189 Pneumonia, unspecified organism: Secondary | ICD-10-CM | POA: Diagnosis not present

## 2017-08-01 DIAGNOSIS — Z87891 Personal history of nicotine dependence: Secondary | ICD-10-CM

## 2017-08-01 DIAGNOSIS — R531 Weakness: Secondary | ICD-10-CM | POA: Diagnosis not present

## 2017-08-01 DIAGNOSIS — M19042 Primary osteoarthritis, left hand: Secondary | ICD-10-CM | POA: Diagnosis present

## 2017-08-01 DIAGNOSIS — R55 Syncope and collapse: Secondary | ICD-10-CM | POA: Diagnosis present

## 2017-08-01 DIAGNOSIS — Z95818 Presence of other cardiac implants and grafts: Secondary | ICD-10-CM

## 2017-08-01 DIAGNOSIS — K5730 Diverticulosis of large intestine without perforation or abscess without bleeding: Secondary | ICD-10-CM | POA: Diagnosis not present

## 2017-08-01 DIAGNOSIS — R918 Other nonspecific abnormal finding of lung field: Secondary | ICD-10-CM | POA: Diagnosis not present

## 2017-08-01 DIAGNOSIS — Z808 Family history of malignant neoplasm of other organs or systems: Secondary | ICD-10-CM

## 2017-08-01 DIAGNOSIS — Y95 Nosocomial condition: Secondary | ICD-10-CM | POA: Diagnosis present

## 2017-08-01 DIAGNOSIS — H3530 Unspecified macular degeneration: Secondary | ICD-10-CM | POA: Diagnosis present

## 2017-08-01 DIAGNOSIS — M19071 Primary osteoarthritis, right ankle and foot: Secondary | ICD-10-CM | POA: Diagnosis not present

## 2017-08-01 DIAGNOSIS — I441 Atrioventricular block, second degree: Secondary | ICD-10-CM | POA: Diagnosis not present

## 2017-08-01 DIAGNOSIS — Z86010 Personal history of colonic polyps: Secondary | ICD-10-CM | POA: Diagnosis not present

## 2017-08-01 DIAGNOSIS — I081 Rheumatic disorders of both mitral and tricuspid valves: Secondary | ICD-10-CM | POA: Diagnosis present

## 2017-08-01 DIAGNOSIS — Z7901 Long term (current) use of anticoagulants: Secondary | ICD-10-CM

## 2017-08-01 DIAGNOSIS — Z9842 Cataract extraction status, left eye: Secondary | ICD-10-CM | POA: Diagnosis not present

## 2017-08-01 DIAGNOSIS — K219 Gastro-esophageal reflux disease without esophagitis: Secondary | ICD-10-CM | POA: Diagnosis present

## 2017-08-01 DIAGNOSIS — Z8249 Family history of ischemic heart disease and other diseases of the circulatory system: Secondary | ICD-10-CM

## 2017-08-01 DIAGNOSIS — Z7951 Long term (current) use of inhaled steroids: Secondary | ICD-10-CM | POA: Diagnosis not present

## 2017-08-01 DIAGNOSIS — M19041 Primary osteoarthritis, right hand: Secondary | ICD-10-CM | POA: Diagnosis not present

## 2017-08-01 DIAGNOSIS — J90 Pleural effusion, not elsewhere classified: Secondary | ICD-10-CM | POA: Diagnosis not present

## 2017-08-01 DIAGNOSIS — I480 Paroxysmal atrial fibrillation: Secondary | ICD-10-CM | POA: Diagnosis not present

## 2017-08-01 DIAGNOSIS — Z85810 Personal history of malignant neoplasm of tongue: Secondary | ICD-10-CM | POA: Diagnosis not present

## 2017-08-01 DIAGNOSIS — I5032 Chronic diastolic (congestive) heart failure: Secondary | ICD-10-CM | POA: Diagnosis not present

## 2017-08-01 DIAGNOSIS — E785 Hyperlipidemia, unspecified: Secondary | ICD-10-CM | POA: Diagnosis present

## 2017-08-01 DIAGNOSIS — I130 Hypertensive heart and chronic kidney disease with heart failure and stage 1 through stage 4 chronic kidney disease, or unspecified chronic kidney disease: Secondary | ICD-10-CM | POA: Diagnosis not present

## 2017-08-01 DIAGNOSIS — Z825 Family history of asthma and other chronic lower respiratory diseases: Secondary | ICD-10-CM

## 2017-08-01 HISTORY — PX: PACEMAKER IMPLANT: EP1218

## 2017-08-01 LAB — I-STAT TROPONIN, ED: Troponin i, poc: 0 ng/mL (ref 0.00–0.08)

## 2017-08-01 LAB — URINALYSIS, ROUTINE W REFLEX MICROSCOPIC
Bilirubin Urine: NEGATIVE
GLUCOSE, UA: NEGATIVE mg/dL
HGB URINE DIPSTICK: NEGATIVE
Ketones, ur: NEGATIVE mg/dL
LEUKOCYTES UA: NEGATIVE
Nitrite: NEGATIVE
PROTEIN: NEGATIVE mg/dL
Specific Gravity, Urine: 1.006 (ref 1.005–1.030)
pH: 6 (ref 5.0–8.0)

## 2017-08-01 LAB — BASIC METABOLIC PANEL
ANION GAP: 11 (ref 5–15)
BUN: 17 mg/dL (ref 8–23)
CO2: 24 mmol/L (ref 22–32)
Calcium: 8.5 mg/dL — ABNORMAL LOW (ref 8.9–10.3)
Chloride: 100 mmol/L (ref 98–111)
Creatinine, Ser: 1.8 mg/dL — ABNORMAL HIGH (ref 0.61–1.24)
GFR calc Af Amer: 38 mL/min — ABNORMAL LOW (ref >60)
GFR, EST NON AFRICAN AMERICAN: 33 mL/min — AB (ref >60)
GLUCOSE: 99 mg/dL (ref 70–99)
POTASSIUM: 4.4 mmol/L (ref 3.5–5.1)
Sodium: 135 mmol/L (ref 135–145)

## 2017-08-01 LAB — HEPATIC FUNCTION PANEL
ALBUMIN: 2.8 g/dL — AB (ref 3.5–5.0)
ALK PHOS: 57 U/L (ref 38–126)
ALT: 16 U/L (ref 0–44)
AST: 25 U/L (ref 15–41)
BILIRUBIN TOTAL: 0.7 mg/dL (ref 0.3–1.2)
Bilirubin, Direct: 0.1 mg/dL (ref 0.0–0.2)
Indirect Bilirubin: 0.6 mg/dL (ref 0.3–0.9)
TOTAL PROTEIN: 6.7 g/dL (ref 6.5–8.1)

## 2017-08-01 LAB — CBC
HEMATOCRIT: 35.2 % — AB (ref 39.0–52.0)
HEMOGLOBIN: 10.9 g/dL — AB (ref 13.0–17.0)
MCH: 27.3 pg (ref 26.0–34.0)
MCHC: 31 g/dL (ref 30.0–36.0)
MCV: 88 fL (ref 78.0–100.0)
Platelets: 265 10*3/uL (ref 150–400)
RBC: 4 MIL/uL — ABNORMAL LOW (ref 4.22–5.81)
RDW: 14.2 % (ref 11.5–15.5)
WBC: 9.6 10*3/uL (ref 4.0–10.5)

## 2017-08-01 LAB — I-STAT CG4 LACTIC ACID, ED
LACTIC ACID, VENOUS: 0.84 mmol/L (ref 0.5–1.9)
Lactic Acid, Venous: 1.72 mmol/L (ref 0.5–1.9)

## 2017-08-01 LAB — BRAIN NATRIURETIC PEPTIDE: B Natriuretic Peptide: 475 pg/mL — ABNORMAL HIGH (ref 0.0–100.0)

## 2017-08-01 LAB — TROPONIN I: Troponin I: <0.03 ng/mL (ref <0.03)

## 2017-08-01 LAB — MAGNESIUM: MAGNESIUM: 2.1 mg/dL (ref 1.7–2.4)

## 2017-08-01 SURGERY — PACEMAKER IMPLANT
Anesthesia: LOCAL

## 2017-08-01 MED ORDER — VANCOMYCIN HCL 10 G IV SOLR
1500.0000 mg | Freq: Once | INTRAVENOUS | Status: AC
  Administered 2017-08-01: 1500 mg via INTRAVENOUS
  Filled 2017-08-01: qty 1500

## 2017-08-01 MED ORDER — GENTAMICIN SULFATE 40 MG/ML IJ SOLN
INTRAMUSCULAR | Status: AC
  Filled 2017-08-01: qty 2

## 2017-08-01 MED ORDER — HEPARIN (PORCINE) IN NACL 1000-0.9 UT/500ML-% IV SOLN
INTRAVENOUS | Status: AC
  Filled 2017-08-01: qty 500

## 2017-08-01 MED ORDER — FENTANYL CITRATE (PF) 100 MCG/2ML IJ SOLN
INTRAMUSCULAR | Status: DC | PRN
  Administered 2017-08-01: 25 ug via INTRAVENOUS

## 2017-08-01 MED ORDER — ONDANSETRON HCL 4 MG/2ML IJ SOLN: 4.0000 mg | Freq: Four times a day (QID) | INTRAMUSCULAR | Status: DC | PRN

## 2017-08-01 MED ORDER — LIDOCAINE HCL (PF) 1 % IJ SOLN
INTRAMUSCULAR | Status: AC
  Filled 2017-08-01: qty 30

## 2017-08-01 MED ORDER — CEFEPIME HCL 2 G IJ SOLR
2.0000 g | Freq: Once | INTRAMUSCULAR | Status: AC
  Administered 2017-08-01: 2 g via INTRAVENOUS
  Filled 2017-08-01: qty 2

## 2017-08-01 MED ORDER — SODIUM CHLORIDE 0.9 % IV SOLN
INTRAVENOUS | Status: DC | PRN
  Administered 2017-08-01: 17:00:00

## 2017-08-01 MED ORDER — CEFAZOLIN SODIUM-DEXTROSE 1-4 GM/50ML-% IV SOLN: 1.0000 g | Freq: Four times a day (QID) | INTRAVENOUS | Status: DC

## 2017-08-01 MED ORDER — MIDAZOLAM HCL 5 MG/5ML IJ SOLN
INTRAMUSCULAR | Status: DC | PRN
  Administered 2017-08-01: 1 mg via INTRAVENOUS

## 2017-08-01 MED ORDER — ACETAMINOPHEN 325 MG PO TABS: 325.0000 mg | ORAL_TABLET | ORAL | Status: DC | PRN

## 2017-08-01 MED ORDER — MIDAZOLAM HCL 5 MG/5ML IJ SOLN
INTRAMUSCULAR | Status: AC
  Filled 2017-08-01: qty 5

## 2017-08-01 MED ORDER — SODIUM CHLORIDE 0.9 % IV SOLN
1.0000 g | INTRAVENOUS | Status: DC
  Filled 2017-08-01: qty 1

## 2017-08-01 MED ORDER — LIDOCAINE HCL (PF) 1 % IJ SOLN
INTRAMUSCULAR | Status: DC | PRN
  Administered 2017-08-01: 60 mL

## 2017-08-01 MED ORDER — HEPARIN (PORCINE) IN NACL 1000-0.9 UT/500ML-% IV SOLN
INTRAVENOUS | Status: DC | PRN
  Administered 2017-08-01: 500 mL

## 2017-08-01 MED ORDER — VANCOMYCIN HCL 10 G IV SOLR
1250.0000 mg | INTRAVENOUS | Status: DC
  Filled 2017-08-01: qty 1250

## 2017-08-01 MED ORDER — CEFAZOLIN SODIUM-DEXTROSE 2-4 GM/100ML-% IV SOLN
INTRAVENOUS | Status: AC
  Filled 2017-08-01: qty 100

## 2017-08-01 MED ORDER — FENTANYL CITRATE (PF) 100 MCG/2ML IJ SOLN
INTRAMUSCULAR | Status: AC
  Filled 2017-08-01: qty 2

## 2017-08-01 MED ORDER — CEFAZOLIN SODIUM-DEXTROSE 1-4 GM/50ML-% IV SOLN
1.0000 g | Freq: Three times a day (TID) | INTRAVENOUS | Status: AC
  Administered 2017-08-01 – 2017-08-02 (×3): 1 g via INTRAVENOUS
  Filled 2017-08-01 (×3): qty 50

## 2017-08-01 SURGICAL SUPPLY — 8 items
CABLE SURGICAL S-101-97-12 (CABLE) ×2 IMPLANT
LEAD TENDRIL MRI 52CM LPA1200M (Lead) ×1 IMPLANT
LEAD TENDRIL MRI 58CM LPA1200M (Lead) ×1 IMPLANT
PACEMAKER ASSURITY DR-RF (Pacemaker) ×1 IMPLANT
PAD DEFIB LIFELINK (PAD) ×1 IMPLANT
SHEATH CLASSIC 8F (SHEATH) ×2 IMPLANT
TRAY PACEMAKER INSERTION (PACKS) ×2 IMPLANT
WIRE HI TORQ VERSACORE-J 145CM (WIRE) ×1 IMPLANT

## 2017-08-01 NOTE — ED Triage Notes (Signed)
Pt endorses generalized weakness x 1 week with multiple falls. Pt states "I fall down and I can't get back up" Pt is supposed to be getting a pacemaker soon. Hypotensive in triage. No neuro deficits.

## 2017-08-01 NOTE — ED Notes (Signed)
Radiology at bedside for cxr.

## 2017-08-01 NOTE — Consult Note (Addendum)
ELECTROPHYSIOLOGY CONSULT NOTE    Patient ID: Chad [name] MRN: [id number], DOB/AGE: [birth year] 82 y.o.  Admit date: 08/01/2017 Date of Consult: 08/01/2017  Primary Physician: Cassandria Anger, MD Primary Cardiologist: Stanford Breed Electrophysiologist: Curt Bears  Patient Profile: Chad [name] is a 84 y.o. male with a history of paroxysmal atrial fibrillation, prior CVA, COPD, GERD, and hypertension who is being seen today for the evaluation of syncope and weakness at the request of Dr Ellender Hose.  HPI:  Chad [name] is a 84 y.o. male with the above past medical history.  He wore an event monitor recently for evaluation of recurrent syncope. While wearing the monitor, he had pauses of up to 3.5 seconds as well as junctional bradycardia.  He was referred to EP as an outpatient for consideration of PPM implant.  He presented to the ER today for persistent weakness and syncope last night.  He was walking to the bathroom last night when he passed out without warning and awoke on the floor.  His weak spells also come on without warning bur last for up to 10-15 minutes and resolve slowly over time.  His wife also reports intermittent confusion. He denies recent chest pain or shortness of breath.   Echo 05/2017 demonstrated EF 55-60%, hypokinesis of basal-midanteroseptal and inferoseptal myocardium.  LA mildly dilated, moderate to severe TR, mild MR, PA pressure 45.  He currently is in NSR with a rate in the 70's and "feels normal".   He denies chest pain, palpitations, dyspnea, PND, orthopnea, nausea, vomiting, edema, weight gain, or early satiety.  Past Medical History:  Diagnosis Date  . Abnormal EKG    ST changes with normal coronaries by cath 05/06/12.  . Arthritis    "hands" (01/12/2013); feet (podiatry consultation); "back" (06/12/2017)  . Atrial flutter ([place])    a. Slow ~100bpm when in 2:1; dx 04/2012. b. Placed on Xarelto.  c. s/p ablation 07-08-2012 by Dr Rayann Heman  . Atrial tachycardia  ([place])   . Carotid artery calcification    By CXR - dopplers 05/06/12 without obvious evidence of significant ICA stenosis >40%  . Chronic lower back pain   . Colon polyps 06/26/2005  . COPD (chronic obstructive pulmonary disease) ([place])   . Diverticulosis of colon (without mention of hemorrhage)   . Dysrhythmia   . Gastritis, chronic    Pt denies bleeding 04/2012. EGD 2012 reportedly normal.  . GERD (gastroesophageal reflux disease)   . Heart murmur   . Hypertension   . Hyponatremia    04/2012 r/t diuretic.  . IBS (irritable bowel syndrome)   . Internal and external hemorrhoids without complication   . Macular degeneration of left eye   . Pneumonia    "@ least 5 times" (06/12/2017)  . Shortness of breath dyspnea    walking distances  . Stroke ([place])   . Tongue cancer ([place]) 02/1997     Surgical History:  Past Surgical History:  Procedure Laterality Date  . ATRIAL FLUTTER ABLATION N/A 07/08/2012   Procedure: ATRIAL FLUTTER ABLATION;  Surgeon: Thompson Grayer, MD;  Location: [facility] CATH LAB;  Service: Cardiovascular;  Laterality: N/A;  . CARDIAC CATHETERIZATION  04/2012  . CATARACT EXTRACTION W/ INTRAOCULAR LENS IMPLANT Left   . COLONOSCOPY  08/29/10   diverticulosis, internal hemorrhoids  . ESOPHAGOGASTRODUODENOSCOPY  09/20/10   normal  . [place]  . LEFT HEART CATHETERIZATION WITH CORONARY ANGIOGRAM N/A 05/06/2012   Procedure: LEFT HEART CATHETERIZATION WITH CORONARY ANGIOGRAM;  Surgeon: Peter M Martinique,  MD;  Location: [facility] CATH LAB;  Service: Cardiovascular;  Laterality: N/A;  . 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 TRIGGER FINGER RELEASE Right 1970's   "2 fingers"  . WART FULGURATION Left 09/15/2015   Procedure: EXCISIONal biospy of left peri anual and anual canal mass;  Surgeon: Greer Pickerel, MD;  Location: [facility] ORS;  Service: General;  Laterality: Left;       Current Facility-Administered Medications:  .  [START ON 08/02/2017] ceFEPIme (MAXIPIME) 1 g in  sodium chloride 0.9 % 100 mL IVPB, 1 g, Intravenous, Q24H, Rumbarger, Rachel L, RPH .  ceFEPIme (MAXIPIME) 2 g in sodium chloride 0.9 % 100 mL IVPB, 2 g, Intravenous, Once, Rumbarger, Valeda Malm, RPH, Last Rate: 200 mL/hr at 08/01/17 1504, 2 g at 08/01/17 1504 .  [START ON 08/02/2017] vancomycin (VANCOCIN) 1,250 mg in sodium chloride 0.9 % 250 mL IVPB, 1,250 mg, Intravenous, Q24H, Rumbarger, Valeda Malm, RPH .  vancomycin (VANCOCIN) 1,500 mg in sodium chloride 0.9 % 500 mL IVPB, 1,500 mg, Intravenous, Once, Rumbarger, Valeda Malm, [facility]  Current Outpatient Medications:  .  Aclidinium Bromide (TUDORZA PRESSAIR) 400 MCG/ACT AEPB, Inhale 1 Act into the lungs 2 (two) times daily., Disp: 3 each, Rfl: 3 .  amLODipine (NORVASC) 2.5 MG tablet, Take 1 tablet (2.5 mg total) by mouth daily., Disp: 30 tablet, Rfl: 5 .  apixaban (ELIQUIS) 2.5 MG TABS tablet, Take 1 tablet (2.5 mg total) by mouth 2 (two) times daily., Disp: 60 tablet, Rfl: 1 .  atorvastatin (LIPITOR) 40 MG tablet, Take 1 tablet (40 mg total) by mouth daily at 6 PM., Disp: 30 tablet, Rfl: 1 .  cefUROXime (CEFTIN) 250 MG tablet, Take 1 tablet (250 mg total) by mouth 2 (two) times daily for 14 days., Disp: 14 tablet, Rfl: 0 .  cholecalciferol (VITAMIN D) 1000 units tablet, Take 1 tablet (1,000 Units total) by mouth daily., Disp: 100 tablet, Rfl: 3 .  clidinium-chlordiazePOXIDE (LIBRAX) 5-2.5 MG capsule, TAKE 1 CAPSULE BY MOUTH TWICE DAILY, Disp: 60 capsule, Rfl: 3 .  cyanocobalamin 1000 MCG tablet, Take 1,000 mcg by mouth daily. , Disp: , Rfl:  .  famotidine (PEPCID) 20 MG tablet, One at bedtime (Patient taking differently: Take 20 mg by mouth at bedtime. ), Disp: 30 tablet, Rfl: 11 .  fluticasone (FLONASE) 50 MCG/ACT nasal spray, Place 2 sprays into both nostrils daily. (Patient taking differently: Place 2 sprays into both nostrils at bedtime. ), Disp: 48 g, Rfl: 3 .  gabapentin (NEURONTIN) 300 MG capsule, Take 3 capsules at bedtime. (Patient taking  differently: Take 300 mg by mouth 3 (three) times daily. ), Disp: 270 capsule, Rfl: 3 .  multivitamin-lutein (OCUVITE-LUTEIN) CAPS capsule, Take 1 capsule by mouth daily. , Disp: , Rfl:  .  pantoprazole (PROTONIX) 40 MG tablet, Take 1 tablet (40 mg total) by mouth daily. Take 30-60 min before first meal of the day, Disp: 30 tablet, Rfl: 2 .  polyethylene glycol powder (GLYCOLAX/MIRALAX) powder, Take 17 g by mouth 2 (two) times daily as needed., Disp: 3350 g, Rfl: 1 .  promethazine-codeine (PHENERGAN WITH CODEINE) 6.25-10 MG/5ML syrup, Take 5 mLs by mouth every 4 (four) hours as needed., Disp: 300 mL, Rfl: 0 .  senna-docusate (SENOKOT-S) 8.6-50 MG tablet, Take 1 tablet by mouth daily., Disp: , Rfl:  .  tetrahydrozoline 0.05 % ophthalmic solution, Place 1 drop into both eyes as needed (dry eyes)., Disp: , Rfl:   Allergies:  Allergies  Allergen Reactions  .  Dyazide [Hydrochlorothiazide W-Triamterene] Other (See Comments)    Caused hyponatremia 04/2012  . Lisinopril Other (See Comments)    cough    Social History   Socioeconomic History  . Marital status: Divorced    Spouse name: Not on file  . Number of children: 2  . Years of education: 62  . Highest education level: Not on file  Occupational History  . Occupation: Retired  Scientific laboratory technician  . Financial resource strain: Not on file  . Food insecurity:    Worry: Not on file    Inability: Not on file  . Transportation needs:    Medical: Not on file    Non-medical: Not on file  Tobacco Use  . Smoking status: Former Smoker    Packs/day: 0.50    Years: 20.00    Pack years: 10.00    Types: Cigarettes, Pipe, Cigars    Last attempt to quit: 12/08/1996    Years since quitting: 20.6  . Smokeless tobacco: Never Used  . Tobacco comment:    Substance and Sexual Activity  . Alcohol use: No    Alcohol/week: 0.0 oz    Frequency: Never    Comment: last since 1998  . Drug use: Never  . Sexual activity: Not Currently  Lifestyle  . Physical  activity:    Days per week: Not on file    Minutes per session: Not on file  . Stress: Not on file  Relationships  . Social connections:    Talks on phone: Not on file    Gets together: Not on file    Attends religious service: Not on file    Active member of club or organization: Not on file    Attends meetings of clubs or organizations: Not on file    Relationship status: Not on file  . Intimate partner violence:    Fear of current or ex partner: Not on file    Emotionally abused: Not on file    Physically abused: Not on file    Forced sexual activity: Not on file  Other Topics Concern  . Not on file  Social History Narrative   Marital status: divorced; lives with ex-wife; not dating in 2017.      Children:  2 sons, 2 grandsons, 1 granddaughter; no gg.      Lives: with ex-wife in house.      Employment: retired first time age 67; retired in 1997.  Barista x 25 years; Actor.      Tobacco: quit smoking 1997; smoked for 27 years.      Alcohol: quit 2004      Exercise:  Walking daily short distances.       ADLs:  NO assistant devices; has cane if needs it; drives; pays bill; grocery shopping by wife; wife cleans house and does laundry.     Advanced Directives:  +LIVING WILL; +FULL CODE.  HCPOA: oldest son Chad Creekmore Sr.)     Family History  Problem Relation Age of Onset  . Heart disease Brother   . Throat cancer Brother   . Other Mother        UNSURE  . Other Father        UNSURE  . Emphysema Sister   . Emphysema Sister   . Other Brother        UNSURE  . Diabetes Unknown      Review of Systems: All other systems reviewed and are otherwise negative except as noted above.  Physical Exam: Vitals:   08/01/17 1330 08/01/17 1345 08/01/17 1415 08/01/17 1445  BP: (!) 141/71 (!) 150/70 (!) 151/75 (!) 142/80  Pulse:  66 67 65  Resp: 14 16 12 11   Temp:      TempSrc:      SpO2:  98% 96% 97%  Weight:      Height:        GEN- The patient is  elderly appearing, alert and oriented x 3 today.   HEENT: normocephalic, atraumatic; sclera clear, conjunctiva pink; hearing intact; oropharynx clear; neck supple Lungs- Clear to ausculation bilaterally, normal work of breathing.  No wheezes, rales, rhonchi Heart- Regular rate and rhythm  GI- soft, non-tender, non-distended, bowel sounds present Extremities- no clubbing, cyanosis, 1+ BLE edema MS- no significant deformity or atrophy Skin- warm and dry, no rash or lesion Psych- euthymic mood, full affect Neuro- strength and sensation are intact  Labs:  Lab Results  Component Value Date   WBC 9.6 08/01/2017   HGB 10.9 (L) 08/01/2017   HCT 35.2 (L) 08/01/2017   MCV 88.0 08/01/2017   PLT 265 08/01/2017   Recent Labs  Lab 08/01/17 1253  NA 135  K 4.4  CL 100  CO2 24  BUN 17  CREATININE 1.80*  CALCIUM 8.5*  PROT 6.7  BILITOT 0.7  ALKPHOS 57  ALT 16  AST 25  GLUCOSE 99      Radiology/Studies: Dg Chest Portable 1 View  Result Date: 08/01/2017 CLINICAL DATA:  84 year old male with weakness EXAM: PORTABLE CHEST 1 VIEW COMPARISON:  Prior chest x-ray 06/11/2017 FINDINGS: The cardiac and mediastinal contours are unchanged. Stable cardiomegaly. Atherosclerotic calcifications are present in the transverse aorta. Extensive patchy airspace opacities present in the left lower lobe completely obscuring the cardiac margin. Similarly, chronic changes are present in the medial aspect of the right lower lobe. No pneumothorax. No pulmonary edema. Overall, inspiratory volumes are low. No acute osseous abnormality. Bilateral carotid artery calcifications. IMPRESSION: 1. Extensive left lower lobe airspace opacities, similar in appearance compared to 06/11/2017. This may represent persistent or recurrent pneumonia versus atelectasis superimposed on a background of chronic parenchymal disease. Small left pleural effusion is not excluded. 2. Similar appearance of chronic atelectasis or scarring in the  medial right lower lobe. 3. Inspiratory volumes are low. 4.  Aortic Atherosclerosis (ICD10-170.0) Electronically Signed   By: Jacqulynn Cadet M.D.   On: 08/01/2017 14:00    EKG:SR, 1st degree AV block (personally reviewed)  TELEMETRY: SR (personally reviewed)  Assessment/Plan: 1.  Recurrent syncope He has had recurrent syncope with documented pauses of up to 3.5 seconds during waking hours.  He is on no AVN blocking agents Risks, benefits to pacemaker implantation reviewed with the patient who wishes to proceed. Will plan for later today  2.  Weakness Unclear cause CXR concerning for persistent or recurrent pneumonia Will defer antibiotics and further work up to primary team I do not think that pacemaker will help all of his weakness  3.  Paroxysmal atrial fibrillation Currently maintaining SR Will hold Eliquis for a couple of doses post procedure depending on how pocket looks at time of implant  CHADS2VASC is 5  4.  CKD Will limit contrast during pacemaker implant   Signed, Chanetta Marshall, NP 08/01/2017 3:07 PM  I have seen and examined this patient with Chanetta Marshall.  Agree with above, note added to reflect my findings.  On exam, RRR, no murmurs, lungs clear.  Patient has a history of recurrent syncope.  Cardiac monitor shows junctional bradycardia as well as up to 3-1/2-second pauses.  Due to his recurrent syncope, pacemaker is indicated.  Chad [name] has presented today for surgery, with the diagnosis of junctional bradycardia, syncope.  The various methods of treatment have been discussed with the patient and family. After consideration of risks, benefits and other options for treatment, the patient has consented to  Procedure(s): Pacemaker implant as a surgical intervention .  Risks include but not limited to bleeding, tamponade, infection, pneumothorax, among others. The patient's history has been reviewed, patient examined, no change in status, stable for surgery.  I  have reviewed the patient's chart and labs.  Questions were answered to the patient's satisfaction.    Will M. Camnitz MD 08/01/2017 3:58 PM

## 2017-08-01 NOTE — Progress Notes (Signed)
Pharmacy Antibiotic Note  Chad [name] is a 84 y.o. male admitted on 08/01/2017 with pneumonia. Pharmacy has been consulted for vancomycin and cefepime dosing. Pt is afebrile and WBC is WNL. SCr is elevated at 1.8. Lactic acid is <2.   Plan: Vancomycin 1500mg  IV x 1 then 1250mg  IV Q24H Cefepime 2gm IV x 1 then 1gm IV Q24H F/u renal fxn, C&S, clinical status and trough at SS  Height: 6\' 1"  (185.4 cm) Weight: 184 lb (83.5 kg) IBW/kg (Calculated) : 79.9  Temp (24hrs), Avg:97.4 F (36.3 C), Min:97.4 F (36.3 C), Max:97.4 F (36.3 C)  Recent Labs  Lab 08/01/17 1253 08/01/17 1329  WBC 9.6  --   CREATININE 1.80*  --   LATICACIDVEN  --  1.72    Estimated Creatinine Clearance: 34.5 mL/min (A) (by C-G formula based on SCr of 1.8 mg/dL (H)).    Allergies  Allergen Reactions  . Dyazide [Hydrochlorothiazide W-Triamterene] Other (See Comments)    Caused hyponatremia 04/2012  . Lisinopril Other (See Comments)    cough    Antimicrobials this admission: Vanc 7/25>> Zosyn 7/25>>  Dose adjustments this admission: N/A  Microbiology results: Pending  Thank you for allowing pharmacy to be a part of this patient's care.  Tabor Bartram, Rande Lawman 08/01/2017 2:37 PM

## 2017-08-01 NOTE — Progress Notes (Signed)
Patient admitted to 6E10 from Cath Lab via bed.  Bed in low position, wheels locked.  Patient denies chest pain/shortness of breath.  Telemetry monitor applied.  Patient oriented to environment, including call bell, TV, meal times, and hourly rounding. Left pacer site clean, dry and intact. No swelling. Sling on left arm.

## 2017-08-01 NOTE — ED Provider Notes (Addendum)
[place] EMERGENCY DEPARTMENT Provider Note   CSN: [id number] Arrival date & time: 08/01/17  1225     History   Chief Complaint Chief Complaint  Patient presents with  . Weakness  . Fall    HPI Chad [name] is a 84 y.o. male.  HPI   84 year old male with extensive past medical history as below including history of A. fib, a flutter, and idioventricular rhythm with planned pacemaker in several weeks, here with generalized weakness.  The patient reports that over the last 2 to 3 weeks, has had progressively worsening generalized weakness.  The patient has had associated dizziness upon standing and with exertion.  Over the last week, he has syncopized multiple times.  He states that he began standing, then begins to feel weak.  He then passes out.  Is also had episodes of passing out prior to any kind of warning symptoms.  He has been unable to walk for the last several days due to his weakness.  He said little to no appetite.  No known fevers or chills.  No other medical complaints.  Denies any abdominal pain, nausea, vomiting, or diarrhea.  Symptoms seem to come and go randomly, are not particularly worse with exertion.  He does state his lightheadedness occasionally gets worse when standing, but this is different from his passing out episodes.  Past Medical History:  Diagnosis Date  . Arthritis    "hands" (01/12/2013); feet (podiatry consultation); "back" (06/12/2017)  . Atrial flutter ([place])    a. Slow ~100bpm when in 2:1; dx 04/2012. b. Placed on Xarelto.  c. s/p ablation 07-08-2012 by Dr Rayann Heman  . Carotid artery calcification    By CXR - dopplers 05/06/12 without obvious evidence of significant ICA stenosis >40%  . Colon polyps 06/26/2005  . COPD (chronic obstructive pulmonary disease) ([place])   . Diverticulosis of colon (without mention of hemorrhage)   . Gastritis, chronic    Pt denies bleeding 04/2012. EGD 2012 reportedly normal.  . GERD (gastroesophageal reflux  disease)   . Hypertension   . Hyponatremia    04/2012 r/t diuretic.  . IBS (irritable bowel syndrome)   . Internal and external hemorrhoids without complication   . Macular degeneration of left eye   . Pneumonia    "@ least 5 times" (06/12/2017)  . Stroke ([place])   . Tongue cancer ([place]) 02/1997    Patient Active Problem List   Diagnosis Date Noted  . Syncope 08/01/2017  . Wart viral 06/17/2017  . Bradycardia   . Calcium channel blocker overdose   . Back pain 06/11/2017  . Symptomatic bradycardia 06/11/2017  . Hypertension 06/11/2017  . 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 History of CVA (cerebrovascular accident)-May 2019 06/11/2017  . Acute hyponatremia 06/11/2017  . Chronic kidney disease (CKD), stage IV (severe) ([place]) 06/11/2017  . Chronic diastolic heart failure ([place]) 06/11/2017  . HLD (hyperlipidemia) 06/11/2017  . Paroxysmal atrial flutter ([place]) 06/11/2017  . Cerebral embolism with cerebral infarction 06/04/2017  . CVA (cerebral vascular accident) ([place]) 06/04/2017  . Stroke-like symptoms 06/03/2017  . Bronchiectasis without complication ([place]) 83/38/2505  . DOE (dyspnea on exertion) 04/24/2017  . Cough in adult 02/13/2017  . Flatulence 02/13/2017  . Hemoptysis 06/12/2016  . Creatinine elevation 06/12/2016  . Eyebrow laceration, right, sequela 05/18/2016  . Abnormal TSH 05/18/2016  . Well adult exam 04/20/2016  . Enlarged prostate 04/20/2016  . Community acquired pneumonia   . Pneumonia of both lungs due  to infectious organism 03/02/2016  . 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 Anal lesion 06/07/2015  . Gas 01/18/2015  . Aortic stenosis 06/08/2013  . Unsteadiness 02/11/2013  . Ectopic atrial tachycardia ([place]) 01/12/2013  . Atrial tachycardia ([place]) 01/12/2013  . Atrial flutter ([place]) 05/06/2012  . Aortic valve sclerosis 05/06/2012  . Acute renal failure ([place]) 05/05/2012  . Intra-atrial reentry tachycardia  05/05/2012  . 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 Anemia 07/27/2010  . Essential hypertension 11/15/2008  . COPD (chronic obstructive pulmonary disease) with chronic bronchitis ([place]) 11/15/2008  . GERD 11/15/2008  . IBS 11/15/2008  . Midsternal chest pain 11/15/2008    Past Surgical History:  Procedure Laterality Date  . ATRIAL FLUTTER ABLATION N/A 07/08/2012   Procedure: ATRIAL FLUTTER ABLATION;  Surgeon: Thompson Grayer, MD;  Location: [facility] CATH LAB;  Service: Cardiovascular;  Laterality: N/A;  . CARDIAC CATHETERIZATION  04/2012  . CATARACT EXTRACTION W/ INTRAOCULAR LENS IMPLANT Left   . COLONOSCOPY  08/29/10   diverticulosis, internal hemorrhoids  . ESOPHAGOGASTRODUODENOSCOPY  09/20/10   normal  . [place]  . LEFT HEART CATHETERIZATION WITH CORONARY ANGIOGRAM N/A 05/06/2012   Procedure: LEFT HEART CATHETERIZATION WITH CORONARY ANGIOGRAM;  Surgeon: Peter M Martinique, MD;  Location: [facility] CATH LAB;  Service: Cardiovascular;  Laterality: N/A;  . 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 TRIGGER FINGER RELEASE Right 1970's   "2 fingers"  . WART FULGURATION Left 09/15/2015   Procedure: EXCISIONal biospy of left peri anual and anual canal mass;  Surgeon: Greer Pickerel, MD;  Location: [facility] ORS;  Service: General;  Laterality: Left;        Home Medications    Prior to Admission medications   Medication Sig Start Date End Date Taking? Authorizing Provider  Aclidinium Bromide (TUDORZA PRESSAIR) 400 MCG/ACT AEPB Inhale 1 Act into the lungs 2 (two) times daily. 07/29/17  Yes Plotnikov, Evie Lacks, MD  amLODipine (NORVASC) 2.5 MG tablet Take 1 tablet (2.5 mg total) by mouth daily. 07/09/17  Yes Lelon Perla, MD  apixaban (ELIQUIS) 2.5 MG TABS tablet Take 1 tablet (2.5 mg total) by mouth 2 (two) times daily. 06/05/17  Yes Caren Griffins, MD  atorvastatin (LIPITOR) 40 MG tablet Take 1 tablet (40 mg total) by mouth daily at 6 PM. 06/05/17  Yes Gherghe, Vella Redhead, MD    cefUROXime (CEFTIN) 250 MG tablet Take 1 tablet (250 mg total) by mouth 2 (two) times daily for 14 days. 07/29/17 08/12/17 Yes Plotnikov, Evie Lacks, MD  cholecalciferol (VITAMIN D) 1000 units tablet Take 1 tablet (1,000 Units total) by mouth daily. 10/15/16  Yes Plotnikov, Evie Lacks, MD  clidinium-chlordiazePOXIDE (LIBRAX) 5-2.5 MG capsule TAKE 1 CAPSULE BY MOUTH TWICE DAILY 04/01/17  Yes Plotnikov, Evie Lacks, MD  cyanocobalamin 1000 MCG tablet Take 1,000 mcg by mouth daily.    Yes [provider]  famotidine (PEPCID) 20 MG tablet One at bedtime Patient taking differently: Take 20 mg by mouth at bedtime.  05/27/17  Yes Tanda Rockers, MD  fluticasone (FLONASE) 50 MCG/ACT nasal spray Place 2 sprays into both nostrils daily. Patient taking differently: Place 2 sprays into both nostrils at bedtime.  07/22/16  Yes Plotnikov, Evie Lacks, MD  gabapentin (NEURONTIN) 300 MG capsule Take 3 capsules at bedtime. Patient taking differently: Take 300 mg by mouth 3 (three) times daily.  07/22/16  Yes Plotnikov, Evie Lacks, MD  multivitamin-lutein (OCUVITE-LUTEIN) CAPS capsule Take 1 capsule by  mouth daily.    Yes [provider]  pantoprazole (PROTONIX) 40 MG tablet Take 1 tablet (40 mg total) by mouth daily. Take 30-60 min before first meal of the day 05/27/17  Yes Wert, Christena Deem, MD  polyethylene glycol powder (GLYCOLAX/MIRALAX) powder Take 17 g by mouth 2 (two) times daily as needed. 03/14/16  Yes Wardell Honour, MD  promethazine-codeine [facility] WITH CODEINE) 6.25-10 MG/5ML syrup Take 5 mLs by mouth every 4 (four) hours as needed. 07/29/17  Yes Plotnikov, Evie Lacks, MD  senna-docusate (SENOKOT-S) 8.6-50 MG tablet Take 1 tablet by mouth daily.   Yes [provider]  tetrahydrozoline 0.05 % ophthalmic solution Place 1 drop into both eyes as needed (dry eyes).   Yes [provider]    Family History Family History  Problem Relation Age of Onset  . Heart disease Brother   .  Throat cancer Brother   . Other Mother        UNSURE  . Other Father        UNSURE  . Emphysema Sister   . Emphysema Sister   . Other Brother        UNSURE  . Diabetes Unknown     Social History Social History   Tobacco Use  . Smoking status: Former Smoker    Packs/day: 0.50    Years: 20.00    Pack years: 10.00    Types: Cigarettes, Pipe, Cigars    Last attempt to quit: 12/08/1996    Years since quitting: 20.6  . Smokeless tobacco: Never Used  . Tobacco comment:    Substance Use Topics  . Alcohol use: No    Alcohol/week: 0.0 oz    Frequency: Never    Comment: last since 1998  . Drug use: Never     Allergies   Dyazide [hydrochlorothiazide w-triamterene] and Lisinopril   Review of Systems Review of Systems  Constitutional: Positive for activity change, appetite change and fatigue. Negative for chills and fever.  HENT: Negative for congestion and rhinorrhea.   Eyes: Negative for visual disturbance.  Respiratory: Negative for cough, shortness of breath and wheezing.   Cardiovascular: Negative for chest pain and leg swelling.  Gastrointestinal: Negative for abdominal pain, diarrhea, nausea and vomiting.  Genitourinary: Negative for dysuria and flank pain.  Musculoskeletal: Positive for gait problem. Negative for neck pain and neck stiffness.  Skin: Negative for rash and wound.  Allergic/Immunologic: Negative for immunocompromised state.  Neurological: Positive for syncope and weakness. Negative for headaches.  All other systems reviewed and are negative.    Physical Exam Updated Vital Signs BP (!) 141/70   Pulse 65   Temp (!) 97.4 F (36.3 C) (Oral)   Resp 20   Ht 6\' 1"  (1.854 m)   Wt 83.5 kg (184 lb)   SpO2 96%   BMI 24.28 kg/m   Physical Exam  Constitutional: He is oriented to person, place, and time. He appears well-developed and well-nourished. No distress.  HENT:  Head: Normocephalic and atraumatic.  Dry MM  Eyes: Conjunctivae are normal.   Neck: Neck supple.  Cardiovascular: Normal heart sounds. An irregular rhythm present. Bradycardia present. Exam reveals no friction rub.  No murmur heard. Pulmonary/Chest: Effort normal and breath sounds normal. No respiratory distress. He has no wheezes. He has no rales.  Abdominal: He exhibits no distension.  Musculoskeletal: He exhibits no edema.  Neurological: He is alert and oriented to person, place, and time. He exhibits normal muscle tone.  Skin: Skin is  warm. Capillary refill takes less than 2 seconds.  Psychiatric: He has a normal mood and affect.  Nursing note and vitals reviewed.    ED Treatments / Results  Labs (all labs ordered are listed, but only abnormal results are displayed) Labs Reviewed  BASIC METABOLIC PANEL - Abnormal; Notable for the following components:      Result Value   Creatinine, Ser 1.80 (*)    Calcium 8.5 (*)    GFR calc non Af Amer 33 (*)    GFR calc Af Amer 38 (*)    All other components within normal limits  CBC - Abnormal; Notable for the following components:   RBC 4.00 (*)    Hemoglobin 10.9 (*)    HCT 35.2 (*)    All other components within normal limits  HEPATIC FUNCTION PANEL - Abnormal; Notable for the following components:   Albumin 2.8 (*)    All other components within normal limits  BRAIN NATRIURETIC PEPTIDE - Abnormal; Notable for the following components:   B Natriuretic Peptide 475.0 (*)    All other components within normal limits  CULTURE, BLOOD (ROUTINE X 2)  CULTURE, BLOOD (ROUTINE X 2)  TROPONIN I  URINALYSIS, ROUTINE W REFLEX MICROSCOPIC  MAGNESIUM  I-STAT TROPONIN, ED  I-STAT CG4 LACTIC ACID, ED  I-STAT CG4 LACTIC ACID, ED    EKG EKG Interpretation  Date/Time:  Thursday August 01 2017 12:50:48 EDT Ventricular Rate:  79 PR Interval:  354 QRS Duration: 120 QT Interval:  398 QTC Calculation: 456 R Axis:   -56 Text Interpretation:  Sinus rhythm with 1st degree A-V block Left axis deviation Non-specific  intra-ventricular conduction delay Abnormal ECG Since last EKG, marked first degree HB has replaced IV rhythm Confirmed by Duffy Bruce [phone]) on 08/01/2017 1:56:37 PM   Radiology Dg Chest Portable 1 View  Result Date: 08/01/2017 CLINICAL DATA:  84 year old male with weakness EXAM: PORTABLE CHEST 1 VIEW COMPARISON:  Prior chest x-ray 06/11/2017 FINDINGS: The cardiac and mediastinal contours are unchanged. Stable cardiomegaly. Atherosclerotic calcifications are present in the transverse aorta. Extensive patchy airspace opacities present in the left lower lobe completely obscuring the cardiac margin. Similarly, chronic changes are present in the medial aspect of the right lower lobe. No pneumothorax. No pulmonary edema. Overall, inspiratory volumes are low. No acute osseous abnormality. Bilateral carotid artery calcifications. IMPRESSION: 1. Extensive left lower lobe airspace opacities, similar in appearance compared to 06/11/2017. This may represent persistent or recurrent pneumonia versus atelectasis superimposed on a background of chronic parenchymal disease. Small left pleural effusion is not excluded. 2. Similar appearance of chronic atelectasis or scarring in the medial right lower lobe. 3. Inspiratory volumes are low. 4.  Aortic Atherosclerosis (ICD10-170.0) Electronically Signed   By: Jacqulynn Cadet M.D.   On: 08/01/2017 14:00    Procedures Procedures (including critical care time)  Medications Ordered in ED Medications  ceFEPIme (MAXIPIME) 1 g in sodium chloride 0.9 % 100 mL IVPB ( Intravenous MAR Unhold 08/01/17 1751)  vancomycin (VANCOCIN) 1,250 mg in sodium chloride 0.9 % 250 mL IVPB ( Intravenous MAR Unhold 08/01/17 1751)  acetaminophen (TYLENOL) tablet 325-650 mg (has no administration in time range)  ondansetron (ZOFRAN) injection 4 mg (has no administration in time range)  ceFAZolin (ANCEF) IVPB 1 g/50 mL premix (has no administration in time range)  vancomycin (VANCOCIN) 1,500  mg in sodium chloride 0.9 % 500 mL IVPB (1,500 mg Intravenous New Bag/Given 08/01/17 1518)  ceFEPIme (MAXIPIME) 2 g in sodium chloride 0.9 %  100 mL IVPB (0 g Intravenous Stopped 08/01/17 1534)     Initial Impression / Assessment and Plan / ED Course  I have reviewed the triage vital signs and the nursing notes.  Pertinent labs & imaging results that were available during my care of the patient were reviewed by me and considered in my medical decision making (see chart for details).   84 yo M here with generalized weakness, fall, syncope. Suspect multifactorial deconditioning, complicated by underlying intermittent heart block. CXR today shows RLL PNA - this was actually noted previously but not treated per my review of records. Labs are o/w reassuring, mild worsening of Cr likely dehydration related. Will give fluids, start empiric ABX, and consult Cardiology. Will add on CT Chest - can be done as inpatient, to further evaluation. Doubt PE, Cr will not allow contrast.  Cardiology to take for PM placement. Pt remains HDS. Will admit after PM per Cards.  Final Clinical Impressions(s) / ED Diagnoses   Final diagnoses:  Second degree heart block  HCAP (healthcare-associated pneumonia)    ED Discharge Orders    None       Duffy Bruce, MD 08/01/17 Kristian Covey    Duffy Bruce, MD 08/01/17 Bosie Helper

## 2017-08-01 NOTE — ED Notes (Signed)
Pt Report given to Cath Lab RN. Family taken to waiting area by ED EMT.

## 2017-08-02 ENCOUNTER — Inpatient Hospital Stay (HOSPITAL_COMMUNITY): Payer: Medicare Other

## 2017-08-02 ENCOUNTER — Encounter (HOSPITAL_COMMUNITY): Payer: Self-pay | Admitting: Cardiology

## 2017-08-02 MED ORDER — APIXABAN 2.5 MG PO TABS: 2.5000 mg | ORAL_TABLET | Freq: Two times a day (BID) | ORAL | 1 refills | Status: DC

## 2017-08-02 MED ORDER — CEFAZOLIN SODIUM-DEXTROSE 2-4 GM/100ML-% IV SOLN
2.0000 g | INTRAVENOUS | Status: DC
  Filled 2017-08-02: qty 100

## 2017-08-02 MED ORDER — CHLORHEXIDINE GLUCONATE 4 % EX LIQD: 60.0000 mL | Freq: Once | CUTANEOUS | Status: DC

## 2017-08-02 MED ORDER — YOU HAVE A PACEMAKER BOOK
Freq: Once | Status: AC
  Administered 2017-08-02: 06:00:00
  Filled 2017-08-02: qty 1

## 2017-08-02 MED ORDER — SODIUM CHLORIDE 0.9 % IV SOLN
80.0000 mg | INTRAVENOUS | Status: DC
  Filled 2017-08-02 (×2): qty 2

## 2017-08-02 MED ORDER — SODIUM CHLORIDE 0.9 % IV SOLN: INTRAVENOUS | Status: DC

## 2017-08-02 MED FILL — Cefazolin Sodium-Dextrose IV Solution 2 GM/100ML-4%: INTRAVENOUS | Qty: 100 | Status: AC

## 2017-08-02 NOTE — Care Management Note (Signed)
Case Management Note  Patient Details  Name: Chad [name] MRN: [id number] Date of Birth: Sep 25, 1931  Subjective/Objective: Pt presented for syncope. PTA from home with ex wife and his brother. PT consulted and recommendations for [facility].  Agency list provided and patient chose [facility] for services.                   Action/Plan: Referral sent to [facility] for [facility] PT Services. [facility] will deliver RW to room prior to transition home. No further needs from CM at this time.   Expected Discharge Date:  08/02/17               Expected Discharge Plan:  [place]  In-House Referral:  NA  Discharge planning Services  CM Consult  Post Acute Care Choice:  Durable Medical Equipment, Home Health Choice offered to:  Patient  DME Arranged:  Walker rolling DME Agency:  [place]:  PT [facility] Agency:  [place]  Status of Service:  Completed, signed off  If discussed at [place] of Stay Meetings, dates discussed:    Additional Comments:  Bethena Roys, RN 08/02/2017, 10:50 AM

## 2017-08-02 NOTE — Progress Notes (Signed)
[facility] delivered walker.  Discharge instructions reviewed with pt and son. Pt and son have no questions at this time. Printed prescription given to pt. Pacemaker site is covered with steri strips and is clean dry and intact.

## 2017-08-02 NOTE — Evaluation (Signed)
Physical Therapy Evaluation Patient Details Name: Chad [name] MRN: [id number] DOB: Oct 06, 1931 Today's Date: 08/02/2017   History of Present Illness  Chad [name] is a 84 y.o. male with the above past medical history. He has recently had recurrent syncope and holter monitor demonstrated sinus pauses as well as intermittent junctional bradycardia. The day of admission he had recurrent syncope and weakness and came to the hospital for evaluation.  The patient has had symptomatic bradycardia without reversible causes identified.  Risks, benefits, and alternatives to PPM implantation were reviewed with the patient who wished to proceed.  The patient was admitted and underwent implantation of a STJ dual chamber PPM with details as outlined above.  He  was monitored on telemetry overnight which demonstrated SR.  Clinical Impression  Pt admitted with above diagnosis. Pt currently with functional limitations due to the deficits listed below (see PT Problem List). Pt was able to ambulate with RW with good stability. Pt states he will use the RW at all times. VSS.  Will get HHPT safety eval and HHOT to address safety with ADLs.  Will follow acutely while pt is here.   Pt will benefit from skilled PT to increase their independence and safety with mobility to allow discharge to the venue listed below.      Follow Up Recommendations Home health PT;Supervision - Intermittent(HHOT)    Equipment Recommendations  Rolling walker with 5" wheels    Recommendations for Other Services       Precautions / Restrictions Precautions Precautions: Fall;ICD/Pacemaker Restrictions Weight Bearing Restrictions: No LUE Weight Bearing: Non weight bearing      Mobility  Bed Mobility Overal bed mobility: Needs Assistance Bed Mobility: Sit to Supine;Supine to Sit     Supine to sit: Supervision Sit to supine: Supervision   General bed mobility comments: No assist but incr time to come to EOB>    Transfers Overall transfer level: Needs assistance Equipment used: Rolling walker (2 wheeled) Transfers: Sit to/from Stand Sit to Stand: Min guard         General transfer comment: Pt needed cues for hand placement.  No physical assist needed.   Ambulation/Gait Ambulation/Gait assistance: Min guard Gait Distance (Feet): 350 Feet Assistive device: None;Rolling walker (2 wheeled) Gait Pattern/deviations: Step-through pattern;Decreased stride length;Trunk flexed   Gait velocity interpretation: <1.31 ft/sec, indicative of household ambulator General Gait Details: Pt unsteady without use of RW with pt reaching for sink and objects in room to steady himself.  Did much better with RW.  Feel that pt is safe to go home with use of RW. Stopped by bathroom on way back from walk and was able to clean self on his own and no assist for sit to stand.  Also washed hands at sink with supervision.   Stairs            Wheelchair Mobility    Modified Rankin (Stroke Patients Only)       Balance Overall balance assessment: Needs assistance Sitting-balance support: No upper extremity supported;Feet supported Sitting balance-Leahy Scale: Good     Standing balance support: Bilateral upper extremity supported;During functional activity Standing balance-Leahy Scale: Poor Standing balance comment: relies on RW for support                             Pertinent Vitals/Pain Pain Assessment: No/denies pain    Home Living Family/patient expects to be discharged to:: Private residence Living Arrangements: Spouse/significant other Available  Help at Discharge: Family;Friend(s);Available PRN/intermittently Type of Home: House Home Access: Ramped entrance     Home Layout: One level Home Equipment: Shower seat - built in;Cane - single point;Grab bars - toilet Additional Comments: used cane outdoors onlly.     Prior Function Level of Independence: Independent;Independent with  assistive device(s);Needs assistance   Gait / Transfers Assistance Needed: pt uses cane outdoors for mobility, no device indoors  ADL's / Homemaking Assistance Needed: pt reports that he is independnet with all ADLs, IADLs and home mgt  Comments: pt drives     Hand Dominance   Dominant Hand: Right    Extremity/Trunk Assessment   Upper Extremity Assessment Upper Extremity Assessment: Defer to OT evaluation    Lower Extremity Assessment Lower Extremity Assessment: Generalized weakness    Cervical / Trunk Assessment Cervical / Trunk Assessment: Kyphotic  Communication   Communication: HOH  Cognition Arousal/Alertness: Awake/alert Behavior During Therapy: WFL for tasks assessed/performed Overall Cognitive Status: Within Functional Limits for tasks assessed                                        General Comments      Exercises     Assessment/Plan    PT Assessment Patient needs continued PT services  PT Problem List Decreased strength;Decreased activity tolerance;Decreased balance;Decreased mobility;Decreased knowledge of use of DME;Decreased safety awareness;Decreased knowledge of precautions       PT Treatment Interventions DME instruction;Gait training;Functional mobility training;Therapeutic activities;Therapeutic exercise;Balance training;Patient/family education    PT Goals (Current goals can be found in the Care Plan section)  Acute Rehab PT Goals Patient Stated Goal: to go home today PT Goal Formulation: With patient Time For Goal Achievement: 08/16/17 Potential to Achieve Goals: Good    Frequency Min 3X/week   Barriers to discharge Decreased caregiver support      Co-evaluation               AM-PAC PT "6 Clicks" Daily Activity  Outcome Measure Difficulty turning over in bed (including adjusting bedclothes, sheets and blankets)?: None Difficulty moving from lying on back to sitting on the side of the bed? : None Difficulty  sitting down on and standing up from a chair with arms (e.g., wheelchair, bedside commode, etc,.)?: None Help needed moving to and from a bed to chair (including a wheelchair)?: A Little Help needed walking in hospital room?: A Little Help needed climbing 3-5 steps with a railing? : A Little 6 Click Score: 21    End of Session Equipment Utilized During Treatment: Gait belt Activity Tolerance: Patient limited by fatigue Patient left: in bed;with call bell/phone within reach Nurse Communication: Mobility status(home needs) PT Visit Diagnosis: Muscle weakness (generalized) (M62.81)    Time: 0865-7846 PT Time Calculation (min) (ACUTE ONLY): 38 min   Charges:   PT Evaluation $PT Eval Moderate Complexity: 1 Mod PT Treatments $Gait Training: 23-37 mins        Trindon Dorton,PT Acute Rehabilitation [phone] (pager)   Berline Lopes 08/02/2017, 10:10 AM

## 2017-08-02 NOTE — Discharge Summary (Addendum)
ELECTROPHYSIOLOGY PROCEDURE DISCHARGE SUMMARY    Patient ID: Chad [name],  MRN: [id number], DOB/AGE: 1931/05/05 84 y.o.  Admit date: 08/01/2017 Discharge date: 08/02/2017  Primary Care Physician: Tresa Garter, MD Primary Cardiologist: Jens Som Electrophysiologist: [facility]  Primary Discharge Diagnosis:  Symptomatic bradycardia and syncope status post pacemaker implantation this admission  Secondary Discharge Diagnosis:  1.  Paroxysmal atrial fibrillation 2.  Prior CVA 3.  COPD 4.  GERD 5.  Hypertension  Allergies  Allergen Reactions  . Dyazide [Hydrochlorothiazide W-Triamterene] Other (See Comments)    Caused hyponatremia 04/2012  . Lisinopril Other (See Comments)    cough     Procedures This Admission:  1.  Implantation of a STJ dual chamber PPM on 08/02/17 by Dr Elberta Fortis.  See op note for full details. There were no immediate post procedure complications. 2.  CXR on 08/02/17 demonstrated no pneumothorax status post device implantation.   Brief HPI/Hospital Course:  Chad [name] is a 84 y.o. male with the above past medical history. He has recently had recurrent syncope and holter monitor demonstrated sinus pauses as well as intermittent junctional bradycardia. The day of admission he had recurrent syncope and weakness and came to the hospital for evaluation.  The patient has had symptomatic bradycardia without reversible causes identified.  Risks, benefits, and alternatives to PPM implantation were reviewed with the patient who wished to proceed.  The patient was admitted and underwent implantation of a STJ dual chamber PPM with details as outlined above.  He  was monitored on telemetry overnight which demonstrated SR.  Left chest was without hematoma or ecchymosis.  The device was interrogated and found to be functioning normally.  CXR was obtained and demonstrated no pneumothorax status post device implantation.  Wound care, arm mobility, and restrictions were  reviewed with the patient.  The patient was examined by Dr Elberta Fortis and considered stable for discharge to home.   The morning of discharge, he is feeling improved. He was started on Ceftin as an outpatient by pulmonary which Sadiyah Kangas be continued for pneumonia. I have advised that he follow up with PCP in the next week to follow up on pneumonia and CKD.    Physical Exam: Vitals:   08/01/17 2000 08/01/17 2130 08/02/17 0000 08/02/17 0656  BP:  (!) 139/56 117/65 (!) 143/67  Pulse: 78 66 72 64  Resp: 17 12 12 19   Temp:  (!) 97.5 F (36.4 C)  97.8 F (36.6 C)  TempSrc:  Oral  Oral  SpO2: 97% 97% 95% 95%  Weight:      Height:        GEN- The patient is well appearing, alert and oriented x 3 today.   HEENT: normocephalic, atraumatic; sclera clear, conjunctiva pink; hearing intact; oropharynx clear; neck supple  Lungs- Clear to ausculation bilaterally, normal work of breathing.  No wheezes, rales, rhonchi Heart- Regular rate and rhythm  GI- soft, non-tender, non-distended, bowel sounds present  Extremities- no clubbing, cyanosis, 1+ BLE edema MS- no significant deformity or atrophy Skin- warm and dry, no rash or lesion, left chest without hematoma/ecchymosis Psych- euthymic mood, full affect Neuro- strength and sensation are intact   Labs:   Lab Results  Component Value Date   WBC 9.6 08/01/2017   HGB 10.9 (L) 08/01/2017   HCT 35.2 (L) 08/01/2017   MCV 88.0 08/01/2017   PLT 265 08/01/2017    Recent Labs  Lab 08/01/17 1253  NA 135  K 4.4  CL 100  CO2 24  BUN 17  CREATININE 1.80*  CALCIUM 8.5*  PROT 6.7  BILITOT 0.7  ALKPHOS 57  ALT 16  AST 25  GLUCOSE 99    Discharge Medications:  Allergies as of 08/02/2017      Reactions   Dyazide [hydrochlorothiazide W-triamterene] Other (See Comments)   Caused hyponatremia 04/2012   Lisinopril Other (See Comments)   cough      Medication List    TAKE these medications   Aclidinium Bromide 400 MCG/ACT Aepb Commonly known  as:  TUDORZA PRESSAIR Inhale 1 Act into the lungs 2 (two) times daily.   amLODipine 2.5 MG tablet Commonly known as:  NORVASC Take 1 tablet (2.5 mg total) by mouth daily.   apixaban 2.5 MG Tabs tablet Commonly known as:  ELIQUIS Take 1 tablet (2.5 mg total) by mouth 2 (two) times daily. Resume 7/28/19PM What changed:  additional instructions   atorvastatin 40 MG tablet Commonly known as:  LIPITOR Take 1 tablet (40 mg total) by mouth daily at 6 PM.   cefUROXime 250 MG tablet Commonly known as:  CEFTIN Take 1 tablet (250 mg total) by mouth 2 (two) times daily for 14 days.   cholecalciferol 1000 units tablet Commonly known as:  VITAMIN D Take 1 tablet (1,000 Units total) by mouth daily.   clidinium-chlordiazePOXIDE 5-2.5 MG capsule Commonly known as:  LIBRAX TAKE 1 CAPSULE BY MOUTH TWICE DAILY   cyanocobalamin 1000 MCG tablet Take 1,000 mcg by mouth daily.   famotidine 20 MG tablet Commonly known as:  PEPCID One at bedtime What changed:    how much to take  how to take this  when to take this  additional instructions   fluticasone 50 MCG/ACT nasal spray Commonly known as:  FLONASE Place 2 sprays into both nostrils daily. What changed:  when to take this   gabapentin 300 MG capsule Commonly known as:  NEURONTIN Take 3 capsules at bedtime. What changed:    how much to take  how to take this  when to take this  additional instructions   multivitamin-lutein Caps capsule Take 1 capsule by mouth daily.   pantoprazole 40 MG tablet Commonly known as:  PROTONIX Take 1 tablet (40 mg total) by mouth daily. Take 30-60 min before first meal of the day   polyethylene glycol powder powder Commonly known as:  GLYCOLAX/MIRALAX Take 17 g by mouth 2 (two) times daily as needed.   promethazine-codeine 6.25-10 MG/5ML syrup Commonly known as:  PHENERGAN with CODEINE Take 5 mLs by mouth every 4 (four) hours as needed.   senna-docusate 8.6-50 MG tablet Commonly  known as:  Senokot-S Take 1 tablet by mouth daily.   tetrahydrozoline 0.05 % ophthalmic solution Place 1 drop into both eyes as needed (dry eyes).       Disposition:  Discharge Instructions    Diet - low sodium heart healthy   Complete by:  As directed    Increase activity slowly   Complete by:  As directed      Follow-up Information    [facility] Follow up on 08/12/2017.   Specialty:  Cardiology Why:  at [facility] for wound check  Contact information: [address], [address] [place] [address] [phone]       Lewayne Bunting, MD Follow up on 09/05/2017.   Specialty:  Cardiology Why:  at 1:40PM  Contact information: [address] [place] [address] [phone]  Regan Lemming, MD Follow up on 11/05/2017.   Specialty:  Cardiology Why:  at 9:30AM Contact information: [address] [place] [address] [phone]        Plotnikov, Georgina Quint, MD Follow up in 1 week(s).   Specialty:  Internal Medicine Why:  call primary care physician to make an appt to be seen next week for follow up on pneumonia and kidney function. Contact information: [address] [place] [address] [phone]           Duration of Discharge Encounter: Greater than 30 minutes including physician time.  Signed, Gypsy Balsam, NP 08/02/2017 8:44 AM  I have seen and examined this patient with Gypsy Balsam.  Agree with above, note added to reflect my findings.  On exam, RRR, no murmurs, lungs clear. Admitted with recurrent syncope, monitor with junctional bradycardia and sinus pauses. S/p St. Jude pacemaker. Plan for follow up in device clinic.    Lititia Sen M. Asah Lamay MD 08/02/2017 11:06 AM

## 2017-08-02 NOTE — Consult Note (Signed)
   [facility] CM Inpatient Consult   08/02/2017  Chad [name] 1931-07-22 [id number]    Patient screened for potential [facility] Care Management services due to unplanned readmission risk score of 27% (high) and multiple hospitalizations.Martin Majestic to bedside to speak with [name] about [place] Management program. He is agreeable and [facility] Care Management written consent obtained. [facility] folder provided.  He reports he lives with his ex- wife and brother-in-law. Denies concerns issues with transportation or medication. Confirmed Primary Care MD is [name] (Shelba Flake listed as doing toc).  Confirmed best contact number for [name] is [phone].  Will make referral for [place]. Has medical history of COPD, AFIB, HTN, pneumonia.   Spoke with inpatient RNCM to make aware [facility] will follow. [name] will have home health with [facility].    Marthenia Rolling, MSN-Ed, RN,BSN [facility] Liaison [phone]

## 2017-08-02 NOTE — Discharge Instructions (Signed)
° ° °  Supplemental Discharge Instructions for  Pacemaker/Defibrillator Patients  Activity No heavy lifting or vigorous activity with your left/right arm for 6 to 8 weeks.  Do not raise your left/right arm above your head for one week.  Gradually raise your affected arm as drawn below.           __        08/06/17                      08/07/17                     08/08/17                   08/09/17  NO DRIVING for    1 week ; you may begin driving on 7/0/14    .  WOUND CARE - Keep the wound area clean and dry.  Do not get this area wet for one week. No showers for one week; you may shower on   08/09/17  . - The tape/steri-strips on your wound will fall off; do not pull them off.  No bandage is needed on the site.  DO  NOT apply any creams, oils, or ointments to the wound area. - If you notice any drainage or discharge from the wound, any swelling or bruising at the site, or you develop a fever > 101? F after you are discharged home, call the office at once.  Special Instructions - You are still able to use cellular telephones; use the ear opposite the side where you have your pacemaker/defibrillator.  Avoid carrying your cellular phone near your device. - When traveling through airports, show security personnel your identification card to avoid being screened in the metal detectors.  Ask the security personnel to use the hand wand. - Avoid arc welding equipment, MRI testing (magnetic resonance imaging), TENS units (transcutaneous nerve stimulators).  Call the office for questions about other devices. - Avoid electrical appliances that are in poor condition or are not properly grounded. - Microwave ovens are safe to be near or to operate.

## 2017-08-04 DIAGNOSIS — N184 Chronic kidney disease, stage 4 (severe): Secondary | ICD-10-CM | POA: Diagnosis not present

## 2017-08-04 DIAGNOSIS — K589 Irritable bowel syndrome without diarrhea: Secondary | ICD-10-CM | POA: Diagnosis not present

## 2017-08-04 DIAGNOSIS — I130 Hypertensive heart and chronic kidney disease with heart failure and stage 1 through stage 4 chronic kidney disease, or unspecified chronic kidney disease: Secondary | ICD-10-CM | POA: Diagnosis not present

## 2017-08-04 DIAGNOSIS — K5790 Diverticulosis of intestine, part unspecified, without perforation or abscess without bleeding: Secondary | ICD-10-CM | POA: Diagnosis not present

## 2017-08-04 DIAGNOSIS — Z87891 Personal history of nicotine dependence: Secondary | ICD-10-CM | POA: Diagnosis not present

## 2017-08-04 DIAGNOSIS — I5032 Chronic diastolic (congestive) heart failure: Secondary | ICD-10-CM | POA: Diagnosis not present

## 2017-08-04 DIAGNOSIS — Z950 Presence of cardiac pacemaker: Secondary | ICD-10-CM | POA: Diagnosis not present

## 2017-08-04 DIAGNOSIS — E785 Hyperlipidemia, unspecified: Secondary | ICD-10-CM | POA: Diagnosis not present

## 2017-08-04 DIAGNOSIS — Z48812 Encounter for surgical aftercare following surgery on the circulatory system: Secondary | ICD-10-CM | POA: Diagnosis not present

## 2017-08-04 DIAGNOSIS — J189 Pneumonia, unspecified organism: Secondary | ICD-10-CM | POA: Diagnosis not present

## 2017-08-04 DIAGNOSIS — Z7901 Long term (current) use of anticoagulants: Secondary | ICD-10-CM | POA: Diagnosis not present

## 2017-08-04 DIAGNOSIS — Z8673 Personal history of transient ischemic attack (TIA), and cerebral infarction without residual deficits: Secondary | ICD-10-CM | POA: Diagnosis not present

## 2017-08-04 DIAGNOSIS — Z9181 History of falling: Secondary | ICD-10-CM | POA: Diagnosis not present

## 2017-08-04 DIAGNOSIS — Z85810 Personal history of malignant neoplasm of tongue: Secondary | ICD-10-CM | POA: Diagnosis not present

## 2017-08-04 DIAGNOSIS — K219 Gastro-esophageal reflux disease without esophagitis: Secondary | ICD-10-CM | POA: Diagnosis not present

## 2017-08-04 DIAGNOSIS — I480 Paroxysmal atrial fibrillation: Secondary | ICD-10-CM | POA: Diagnosis not present

## 2017-08-04 DIAGNOSIS — J440 Chronic obstructive pulmonary disease with acute lower respiratory infection: Secondary | ICD-10-CM | POA: Diagnosis not present

## 2017-08-05 ENCOUNTER — Other Ambulatory Visit: Payer: Self-pay | Admitting: *Deleted

## 2017-08-05 ENCOUNTER — Telehealth: Payer: Self-pay | Admitting: Internal Medicine

## 2017-08-05 NOTE — Telephone Encounter (Signed)
Copied from [place] [id number]. Topic: Quick Communication - See Telephone Encounter >> Aug 05, 2017 11:53 AM Antonieta Iba C wrote: CRM for notification. See Telephone encounter for: 08/05/17.  Rip Harbour RN w/ [facility] called in to request vo for home health PT and Skilled nursing  Frequency  PT -- 2 week 3, 1 week 3 and 1 every other week x 3  Frequency Skilled Nursing:  2 week 1, 1 week 3 and 1 every other week for 3   [phone]  ext [phone]

## 2017-08-05 NOTE — Patient Outreach (Signed)
[facility]) Care Management  08/05/2017  FREMAN LAPAGE October 02, 1931 [id number]   Referral received from hospital liaison as member was recently admitted to hospital 7/25-7/26 for symptomatic bradycardia, had to have pacemaker placed.  Primary MD office will complete transition of care assessment.  Per chart, he also has history of hypertension, atrial flutter, aortic stenosis, hypertension, heart failure, COPD, throat cancer, chronic kidney disease, and CVA.    Call placed to member for follow up after discharge, identity verified.  This care manager introduced self and stated purpose of call.  He report he is "doing the best I can."  Denies any pain or discomfort at this time.  State he had home health visit yesterday, will receive nursing and PT services.  Agrees to home visit on Wednesday for further evaluation/assessment of needs.  Will develop individualized care plan at that time.    Valente David, [place], MSN [place] [phone]

## 2017-08-06 ENCOUNTER — Other Ambulatory Visit: Payer: Self-pay

## 2017-08-06 LAB — CULTURE, BLOOD (ROUTINE X 2)
CULTURE: NO GROWTH
CULTURE: NO GROWTH
Special Requests: ADEQUATE

## 2017-08-06 MED ORDER — APIXABAN 2.5 MG PO TABS: 2.5000 mg | ORAL_TABLET | Freq: Two times a day (BID) | ORAL | 1 refills | Status: DC

## 2017-08-06 NOTE — Telephone Encounter (Signed)
Ok Thx 

## 2017-08-06 NOTE — Telephone Encounter (Signed)
Notified Melinda w/MD response.Marland KitchenJohny Chess

## 2017-08-07 ENCOUNTER — Other Ambulatory Visit: Payer: Self-pay | Admitting: *Deleted

## 2017-08-07 ENCOUNTER — Other Ambulatory Visit: Payer: Self-pay

## 2017-08-07 ENCOUNTER — Other Ambulatory Visit: Payer: Self-pay | Admitting: Pharmacist Clinician (PhC)/ Clinical Pharmacy Specialist

## 2017-08-07 ENCOUNTER — Encounter: Payer: Self-pay | Admitting: *Deleted

## 2017-08-07 MED ORDER — APIXABAN 2.5 MG PO TABS: 2.5000 mg | ORAL_TABLET | Freq: Two times a day (BID) | ORAL | 1 refills | Status: DC

## 2017-08-07 NOTE — Patient Outreach (Addendum)
[facility]) Care Management   08/07/2017  Chad [name] Apr 06, 1931 [id number]  Chad [name] is an 84 y.o. male  Subjective:   Member alert and oriented x3.  Denies pain or discomfort, denies signs/symptoms of infection at insertion site.  Does report he was told he had pneumonia upon discharge, non-productive cough.  Was given inhaler at MD office but has not been able to use it properly (may not be able to take a dep enough breath to use correctly).  Has MD visit on 8/2 with primary MD.  Follow up with cardiology for wound check on 8/5, will meet with cardiologist on 8/29.    Objective:   Review of Systems  Constitutional: Negative.   HENT: Negative.   Eyes: Negative.   Respiratory: Negative.   Cardiovascular: Negative.   Gastrointestinal: Negative.   Genitourinary: Negative.   Musculoskeletal: Negative.   Skin: Negative.   Neurological: Negative.   Endo/Heme/Allergies: Negative.   Psychiatric/Behavioral: Negative.     Physical Exam  Constitutional: He is oriented to person, place, and time. He appears well-developed and well-nourished.  Neck: Normal range of motion.  Cardiovascular: Normal rate, regular rhythm and normal heart sounds.  Respiratory: Effort normal and breath sounds normal.  GI: Soft. Bowel sounds are normal.  Musculoskeletal: Normal range of motion.  Neurological: He is alert and oriented to person, place, and time.  Skin: Skin is warm.   BP 118/64   Pulse 74   Resp 20   Ht 1.854 m (6' 1" )   Wt 188 lb (85.3 kg)   SpO2 95%   BMI 24.80 kg/m   Encounter Medications:   Outpatient Encounter Medications as of 08/07/2017  Medication Sig  . Aclidinium Bromide (TUDORZA PRESSAIR) 400 MCG/ACT AEPB Inhale 1 Act into the lungs 2 (two) times daily.  [name] amLODipine (NORVASC) 2.5 MG tablet Take 1 tablet (2.5 mg total) by mouth daily.  [name] apixaban (ELIQUIS) 2.5 MG TABS tablet Take 1 tablet (2.5 mg total) by mouth 2 (two) times daily. Resume  7/28/19PM  . atorvastatin (LIPITOR) 40 MG tablet Take 1 tablet (40 mg total) by mouth daily at 6 PM.  . cefUROXime (CEFTIN) 250 MG tablet Take 1 tablet (250 mg total) by mouth 2 (two) times daily for 14 days.  . cholecalciferol (VITAMIN D) 1000 units tablet Take 1 tablet (1,000 Units total) by mouth daily.  . clidinium-chlordiazePOXIDE (LIBRAX) 5-2.5 MG capsule TAKE 1 CAPSULE BY MOUTH TWICE DAILY  . cyanocobalamin 1000 MCG tablet Take 1,000 mcg by mouth daily.   . famotidine (PEPCID) 20 MG tablet One at bedtime (Patient taking differently: Take 20 mg by mouth at bedtime. )  . fluticasone (FLONASE) 50 MCG/ACT nasal spray Place 2 sprays into both nostrils daily. (Patient taking differently: Place 2 sprays into both nostrils at bedtime. )  . gabapentin (NEURONTIN) 300 MG capsule Take 3 capsules at bedtime. (Patient taking differently: Take 300 mg by mouth 3 (three) times daily. )  . multivitamin-lutein (OCUVITE-LUTEIN) CAPS capsule Take 1 capsule by mouth daily.   . pantoprazole (PROTONIX) 40 MG tablet Take 1 tablet (40 mg total) by mouth daily. Take 30-60 min before first meal of the day  . polyethylene glycol powder (GLYCOLAX/MIRALAX) powder Take 17 g by mouth 2 (two) times daily as needed.  . promethazine-codeine (PHENERGAN WITH CODEINE) 6.25-10 MG/5ML syrup Take 5 mLs by mouth every 4 (four) hours as needed.  . senna-docusate (SENOKOT-S) 8.6-50 MG tablet Take 1 tablet by mouth daily.  [name]  tetrahydrozoline 0.05 % ophthalmic solution Place 1 drop into both eyes as needed (dry eyes).   No facility-administered encounter medications on file as of 08/07/2017.     Functional Status:   In your present state of health, do you have any difficulty performing the following activities: 08/07/2017 08/01/2017  Hearing? N -  Vision? Y -  Comment - -  Difficulty concentrating or making decisions? Y -  Comment - -  Walking or climbing stairs? Y -  Comment - -  Dressing or bathing? Y -  Doing errands,  shopping? Tempie Donning  Preparing Food and eating ? Y -  Using the Toilet? N -  In the past six months, have you accidently leaked urine? N -  Do you have problems with loss of bowel control? N -  Managing your Medications? N -  Managing your Finances? N -  Housekeeping or managing your Housekeeping? Y -  Some recent data might be hidden    Fall/Depression Screening:    Fall Risk  08/07/2017 07/09/2017 06/17/2017  Falls in the past year? Yes No Yes  Number falls in past yr: 2 or more - 1  Injury with Fall? No - No  Comment - - -  Risk Factor Category  High Fall Risk - -  Risk for fall due to : History of fall(s) - -  Follow up Falls prevention discussed - -   PHQ 2/9 Scores 08/07/2017 06/17/2017 03/14/2016 02/22/2016 02/04/2016 12/06/2015 11/16/2015  PHQ - 2 Score 1 0 0 0 0 0 0    Assessment:    Met with member at scheduled time.  Ex-wife and brother in law present during visit.  He expresses frustration regarding loss of energy and strength and having to use walker.  He has had frequent falls prior to pacer placement, inquires about when PT will visit.  [facility] performed initial assessment on Sunday, but he was told PT would call to schedule visit.  He is aware of PT name Merrilee Seashore) and that he will call to discuss best date/time to visit.    Ex-wife expresses concern regarding mobility.  State when member has fallen, she has at times been the only one in the home and is unable to get member up from floor.  She has hoyer lift from a patient she worked with in the past but is in need of hydraulic fluid. Also requesting a different style wheelchair that what member has in the home.  She is advised to discuss concerns for mobility with PT.  Member expresses that he often have trouble with PTSD as he served several years in the [employer].  He has inquired about treatment in the past.  He has appointment within the next couple weeks at the [facility] center in [place], will address concerns and inquire about a treatment  plan at that time.  Denies any urgent concerns, advised to contact this care manager with concerns.  Provided with this contact information for this care manager as well as 24 hour nurse triage line.  Plan:   Will follow up within the next 2 weeks regarding inhaler use. Will follow up with home visit next month.  [facility] CM Care Plan Problem One     Most Recent Value  Care Plan Problem One  Risk for hopsitalization related to complications of pacemaker as evidenced by recent surgery/admission  Role Documenting the Problem One  Care Management [place] for Problem One  Active  [facility] Long Term Goal  Member will not be readmitted to hospital within the next 31 days  [facility] Long Term Goal Start Date  08/07/17  Interventions for Problem One Long Term Goal  Discussed with member the importance of following discharge instructions, including follow up appointments, medications, diet, and home health involvement, to decrease the risk of readmission  [facility] CM Short Term Goal #1   Member will keep and attend follow up appointments with primary MD, cardiology for wound check, and cardiology within the next 4 weeks  [facility] CM Short Term Goal #1 Start Date  08/07/17  Interventions for Short Term Goal #1  Follow up appointments reviewed with member and family, dates written on calendar as reminders.  Verified ex-wife will provide transportation  [facility] CM Short Term Goal #2   Member will report involvement with home health for PT over the next 4 weeks  [facility] CM Short Term Goal #2 Start Date  08/07/17  Interventions for Short Term Goal #2  Call placed to [facility] to inquire about assignment for PT and visit date/time     Valente David, RN, MSN [place] Manager [phone]

## 2017-08-09 ENCOUNTER — Encounter: Payer: Self-pay | Admitting: Internal Medicine

## 2017-08-09 ENCOUNTER — Ambulatory Visit (INDEPENDENT_AMBULATORY_CARE_PROVIDER_SITE_OTHER): Payer: Medicare Other | Admitting: Internal Medicine

## 2017-08-09 DIAGNOSIS — I130 Hypertensive heart and chronic kidney disease with heart failure and stage 1 through stage 4 chronic kidney disease, or unspecified chronic kidney disease: Secondary | ICD-10-CM | POA: Diagnosis not present

## 2017-08-09 DIAGNOSIS — J449 Chronic obstructive pulmonary disease, unspecified: Secondary | ICD-10-CM

## 2017-08-09 DIAGNOSIS — J189 Pneumonia, unspecified organism: Secondary | ICD-10-CM | POA: Diagnosis not present

## 2017-08-09 DIAGNOSIS — I5032 Chronic diastolic (congestive) heart failure: Secondary | ICD-10-CM | POA: Diagnosis not present

## 2017-08-09 DIAGNOSIS — I639 Cerebral infarction, unspecified: Secondary | ICD-10-CM | POA: Diagnosis not present

## 2017-08-09 DIAGNOSIS — J440 Chronic obstructive pulmonary disease with acute lower respiratory infection: Secondary | ICD-10-CM | POA: Diagnosis not present

## 2017-08-09 DIAGNOSIS — N184 Chronic kidney disease, stage 4 (severe): Secondary | ICD-10-CM | POA: Diagnosis not present

## 2017-08-09 DIAGNOSIS — Z48812 Encounter for surgical aftercare following surgery on the circulatory system: Secondary | ICD-10-CM | POA: Diagnosis not present

## 2017-08-09 MED ORDER — METHYLPREDNISOLONE 4 MG PO TBPK: ORAL_TABLET | ORAL | 0 refills | Status: DC

## 2017-08-09 MED ORDER — METHYLPREDNISOLONE ACETATE 80 MG/ML IJ SUSP
80.0000 mg | Freq: Once | INTRAMUSCULAR | Status: AC
  Administered 2017-08-09: 80 mg via INTRAMUSCULAR

## 2017-08-09 MED ORDER — GLYCOPYRROLATE 25 MCG/ML IN SOLN: 1.0000 | Freq: Every day | RESPIRATORY_TRACT | 11 refills | Status: DC

## 2017-08-09 MED ORDER — GLYCOPYRROLATE 25 MCG/ML IN SOLN: 1.0000 | Freq: Every day | RESPIRATORY_TRACT | Status: DC

## 2017-08-09 NOTE — Assessment & Plan Note (Addendum)
Pt is unable to trigger Tudorza - too weak [place] qd Medrol dosepack Depo 80 mg IM

## 2017-08-09 NOTE — Progress Notes (Signed)
Subjective:  Patient ID: Chad [name], male    DOB: 04-25-1931  Age: 84 y.o. MRN: [id number]  CC: No chief complaint on file.   HPI Chad [name] presents for URI/COPD - unable to trigger Chad [name] - too weak. Not better...  Outpatient Medications Prior to Visit  Medication Sig Dispense Refill  . Aclidinium Bromide (TUDORZA PRESSAIR) 400 MCG/ACT AEPB Inhale 1 Act into the lungs 2 (two) times daily. 3 each 3  . amLODipine (NORVASC) 2.5 MG tablet Take 1 tablet (2.5 mg total) by mouth daily. 30 tablet 5  . apixaban (ELIQUIS) 2.5 MG TABS tablet Take 1 tablet (2.5 mg total) by mouth 2 (two) times daily. Resume 7/28/19PM 180 tablet 1  . atorvastatin (LIPITOR) 40 MG tablet Take 1 tablet (40 mg total) by mouth daily at 6 PM. 30 tablet 1  . cefUROXime (CEFTIN) 250 MG tablet Take 1 tablet (250 mg total) by mouth 2 (two) times daily for 14 days. 14 tablet 0  . cholecalciferol (VITAMIN D) 1000 units tablet Take 1 tablet (1,000 Units total) by mouth daily. 100 tablet 3  . clidinium-chlordiazePOXIDE (LIBRAX) 5-2.5 MG capsule TAKE 1 CAPSULE BY MOUTH TWICE DAILY 60 capsule 3  . cyanocobalamin 1000 MCG tablet Take 1,000 mcg by mouth daily.     . famotidine (PEPCID) 20 MG tablet One at bedtime (Patient taking differently: Take 20 mg by mouth at bedtime. ) 30 tablet 11  . fluticasone (FLONASE) 50 MCG/ACT nasal spray Place 2 sprays into both nostrils daily. (Patient taking differently: Place 2 sprays into both nostrils at bedtime. ) 48 g 3  . gabapentin (NEURONTIN) 300 MG capsule Take 3 capsules at bedtime. (Patient taking differently: Take 300 mg by mouth 3 (three) times daily. ) 270 capsule 3  . multivitamin-lutein (OCUVITE-LUTEIN) CAPS capsule Take 1 capsule by mouth daily.     . pantoprazole (PROTONIX) 40 MG tablet Take 1 tablet (40 mg total) by mouth daily. Take 30-60 min before first meal of the day 30 tablet 2  . polyethylene glycol powder (GLYCOLAX/MIRALAX) powder Take 17 g by mouth 2 (two) times  daily as needed. 3350 g 1  . promethazine-codeine (PHENERGAN WITH CODEINE) 6.25-10 MG/5ML syrup Take 5 mLs by mouth every 4 (four) hours as needed. 300 mL 0  . senna-docusate (SENOKOT-S) 8.6-50 MG tablet Take 1 tablet by mouth daily.    [name] tetrahydrozoline 0.05 % ophthalmic solution Place 1 drop into both eyes as needed (dry eyes).     No facility-administered medications prior to visit.     ROS: Review of Systems  Constitutional: Positive for fatigue. Negative for appetite change and unexpected weight change.  HENT: Negative for congestion, nosebleeds, sneezing, sore throat and trouble swallowing.   Eyes: Negative for itching and visual disturbance.  Respiratory: Positive for cough, shortness of breath and wheezing.   Cardiovascular: Negative for chest pain, palpitations and leg swelling.  Gastrointestinal: Negative for abdominal distention, blood in stool, diarrhea and nausea.  Genitourinary: Negative for frequency and hematuria.  Musculoskeletal: Negative for back pain, gait problem, joint swelling and neck pain.  Skin: Negative for rash.  Neurological: Negative for dizziness, tremors, speech difficulty and weakness.  Psychiatric/Behavioral: Positive for sleep disturbance. Negative for agitation and dysphoric mood. The patient is not nervous/anxious.     Objective:  BP 122/74 (BP Location: Left Arm, Patient Position: Sitting, Cuff Size: Normal)   Pulse 97   Temp 98.2 F (36.8 C) (Oral)   Ht 6\' 1"  (1.854 m)  Wt 188 lb (85.3 kg)   SpO2 99%   BMI 24.80 kg/m   BP Readings from Last 3 Encounters:  08/09/17 122/74  08/07/17 118/64  08/02/17 (!) 155/74    Wt Readings from Last 3 Encounters:  08/09/17 188 lb (85.3 kg)  08/07/17 188 lb (85.3 kg)  08/01/17 184 lb (83.5 kg)    Physical Exam  Constitutional: He is oriented to person, place, and time. He appears well-developed. No distress.  NAD  HENT:  Mouth/Throat: Oropharynx is clear and moist.  Eyes: Pupils are equal,  round, and reactive to light. Conjunctivae are normal.  Neck: Normal range of motion. No JVD present. No thyromegaly present.  Cardiovascular: Normal rate, regular rhythm, normal heart sounds and intact distal pulses. Exam reveals no gallop and no friction rub.  No murmur heard. Pulmonary/Chest: Effort normal and breath sounds normal. No respiratory distress. He has no wheezes. He has no rales. He exhibits no tenderness.  Abdominal: Soft. Bowel sounds are normal. He exhibits no distension and no mass. There is no tenderness. There is no rebound and no guarding.  Musculoskeletal: Normal range of motion. He exhibits no edema or tenderness.  Lymphadenopathy:    He has no cervical adenopathy.  Neurological: He is alert and oriented to person, place, and time. He has normal reflexes. No cranial nerve deficit. He exhibits normal muscle tone. He displays a negative Romberg sign. Coordination and gait normal.  Skin: Skin is warm and dry. No rash noted.  Psychiatric: He has a normal mood and affect. His behavior is normal. Judgment and thought content normal.  decreased BS B  Lab Results  Component Value Date   WBC 9.6 08/01/2017   HGB 10.9 (L) 08/01/2017   HCT 35.2 (L) 08/01/2017   PLT 265 08/01/2017   GLUCOSE 99 08/01/2017   CHOL 176 06/04/2017   TRIG 113 06/04/2017   HDL 31 (L) 06/04/2017   LDLCALC 122 (H) 06/04/2017   ALT 16 08/01/2017   AST 25 08/01/2017   NA 135 08/01/2017   K 4.4 08/01/2017   CL 100 08/01/2017   CREATININE 1.80 (H) 08/01/2017   BUN 17 08/01/2017   CO2 24 08/01/2017   TSH 5.393 (H) 06/11/2017   PSA 1.13 04/24/2016   INR 1.06 06/03/2017   HGBA1C 5.6 06/04/2017    Dg Chest 2 View  Result Date: 08/02/2017 CLINICAL DATA:  Pacemaker placement EXAM: CHEST - 2 VIEW COMPARISON:  CT chest and chest x-ray 08/01/2017 FINDINGS: Interval placement of left subclavian transvenous dual lead pacemaker with leads in the right atrium and right ventricle in good position. No  pneumothorax. Bibasilar airspace disease left greater than right unchanged, possible pneumonia. Small left effusion unchanged. Vascularity normal. IMPRESSION: Satisfactory dual lead pacemaker placement.  No pneumothorax No change bibasilar airspace disease, suspicious for pneumonia. Electronically Signed   By: Franchot Gallo M.D.   On: 08/02/2017 07:36   Ct Head Wo Contrast  Result Date: 08/01/2017 CLINICAL DATA:  Weakness for the past week with multiple falls. EXAM: CT HEAD WITHOUT CONTRAST TECHNIQUE: Contiguous axial images were obtained from the base of the skull through the vertex without intravenous contrast. COMPARISON:  Brain MR and head CT dated 06/03/2017. FINDINGS: Brain: Diffusely enlarged ventricles and subarachnoid spaces. Patchy white matter low density in both cerebral hemispheres. Stable old right frontal lobe infarct. No intracranial hemorrhage, mass lesion or CT evidence of acute infarction. Vascular: No hyperdense vessel or unexpected calcification. Skull: Normal. Negative for fracture or focal lesion. Sinuses/Orbits: Status  post left cataract extraction. Unremarkable paranasal sinuses. Other: None. IMPRESSION: 1. No acute abnormality. 2. Stable diffuse cerebral and cerebellar atrophy, chronic small vessel white matter ischemic changes in both cerebral hemispheres and old right frontal lobe infarct. Electronically Signed   By: Claudie Revering M.D.   On: 08/01/2017 19:09   Ct Chest Wo Contrast  Result Date: 08/01/2017 CLINICAL DATA:  Generalized weakness for the past week with multiple falls. EXAM: CT CHEST WITHOUT CONTRAST TECHNIQUE: Multidetector CT imaging of the chest was performed following the standard protocol without IV contrast. COMPARISON:  Portable chest obtained today and chest CT dated 03/21/2017. FINDINGS: Cardiovascular: Minimal pericardial effusion with a maximum thickness of 7 mm. Mild four-chamber enlargement of the heart, most pronounced involving the left atrium.  Atheromatous arterial calcifications, including the thoracic aorta. Interval left subclavian bipolar pacemaker with lead tips in the right atrium and right ventricular apex, not present earlier today. Mediastinum/Nodes: Mildly enlarged mediastinal and right hilar nodes. A previously demonstrated 14 mm short axis right hilar node has a short axis diameter today of 16 mm on image number 96 series 3. A previously demonstrated 12 mm short axis subcarinal node has a short axis diameter of 14 mm on image number 81 series 3. A right lateral paratracheal node has a short axis diameter of 7 mm on image number 47 series 3, previously 5 mm. Unremarkable thyroid gland and esophagus. Lungs/Pleura: Small bilateral pleural effusions, mildly increased on both sides. Significant increase in patchy opacity in both lower lobes and dependent atelectasis in the left upper lobe. Cylindrical bronchiectasis is again demonstrated in those areas. Also noted are mild bullous changes. No pneumothorax seen following pacemaker placement. Upper Abdomen: Unremarkable. Musculoskeletal: Small posterior epidural lipoma in the inferior thoracic spine on the last images. IMPRESSION: 1. Significant increase in patchy atelectasis or pneumonia in both lower lobes and increased atelectasis in the posterior aspect of the left upper lobe. 2. Previously noted bilateral lower lobe and posterior left upper lobe bronchiectasis. 3. Mildly progressive mediastinal and right hilar adenopathy. This could be reactive, metastatic or lymphoproliferative in nature. 4. Interval left subclavian bipolar pacemaker. 5. Minimal pericardial effusion. 6. Small bilateral pleural effusions, mildly increased. 7. Mild bilateral bullous changes. 8. Mild calcific aortic atherosclerosis. Aortic Atherosclerosis (ICD10-I70.0) and Emphysema (ICD10-J43.9). Electronically Signed   By: Claudie Revering M.D.   On: 08/01/2017 19:23   Dg Chest Portable 1 View  Result Date: 08/01/2017 CLINICAL  DATA:  84 year old male with weakness EXAM: PORTABLE CHEST 1 VIEW COMPARISON:  Prior chest x-ray 06/11/2017 FINDINGS: The cardiac and mediastinal contours are unchanged. Stable cardiomegaly. Atherosclerotic calcifications are present in the transverse aorta. Extensive patchy airspace opacities present in the left lower lobe completely obscuring the cardiac margin. Similarly, chronic changes are present in the medial aspect of the right lower lobe. No pneumothorax. No pulmonary edema. Overall, inspiratory volumes are low. No acute osseous abnormality. Bilateral carotid artery calcifications. IMPRESSION: 1. Extensive left lower lobe airspace opacities, similar in appearance compared to 06/11/2017. This may represent persistent or recurrent pneumonia versus atelectasis superimposed on a background of chronic parenchymal disease. Small left pleural effusion is not excluded. 2. Similar appearance of chronic atelectasis or scarring in the medial right lower lobe. 3. Inspiratory volumes are low. 4.  Aortic Atherosclerosis (ICD10-170.0) Electronically Signed   By: Jacqulynn Cadet M.D.   On: 08/01/2017 14:00    Assessment & Plan:   There are no diagnoses linked to this encounter.   No orders of the defined  types were placed in this encounter.    Follow-up: No follow-ups on file.  Walker Kehr, MD

## 2017-08-12 ENCOUNTER — Ambulatory Visit (INDEPENDENT_AMBULATORY_CARE_PROVIDER_SITE_OTHER): Payer: Medicare Other | Admitting: *Deleted

## 2017-08-12 DIAGNOSIS — Z85810 Personal history of malignant neoplasm of tongue: Secondary | ICD-10-CM

## 2017-08-12 DIAGNOSIS — K219 Gastro-esophageal reflux disease without esophagitis: Secondary | ICD-10-CM | POA: Diagnosis not present

## 2017-08-12 DIAGNOSIS — I480 Paroxysmal atrial fibrillation: Secondary | ICD-10-CM

## 2017-08-12 DIAGNOSIS — J440 Chronic obstructive pulmonary disease with acute lower respiratory infection: Secondary | ICD-10-CM

## 2017-08-12 DIAGNOSIS — R001 Bradycardia, unspecified: Secondary | ICD-10-CM | POA: Diagnosis not present

## 2017-08-12 DIAGNOSIS — Z950 Presence of cardiac pacemaker: Secondary | ICD-10-CM | POA: Diagnosis not present

## 2017-08-12 DIAGNOSIS — N184 Chronic kidney disease, stage 4 (severe): Secondary | ICD-10-CM

## 2017-08-12 DIAGNOSIS — I130 Hypertensive heart and chronic kidney disease with heart failure and stage 1 through stage 4 chronic kidney disease, or unspecified chronic kidney disease: Secondary | ICD-10-CM

## 2017-08-12 DIAGNOSIS — Z87891 Personal history of nicotine dependence: Secondary | ICD-10-CM

## 2017-08-12 DIAGNOSIS — I5032 Chronic diastolic (congestive) heart failure: Secondary | ICD-10-CM

## 2017-08-12 DIAGNOSIS — K5790 Diverticulosis of intestine, part unspecified, without perforation or abscess without bleeding: Secondary | ICD-10-CM

## 2017-08-12 DIAGNOSIS — K589 Irritable bowel syndrome without diarrhea: Secondary | ICD-10-CM

## 2017-08-12 DIAGNOSIS — E785 Hyperlipidemia, unspecified: Secondary | ICD-10-CM

## 2017-08-12 DIAGNOSIS — Z8673 Personal history of transient ischemic attack (TIA), and cerebral infarction without residual deficits: Secondary | ICD-10-CM

## 2017-08-12 DIAGNOSIS — Z9181 History of falling: Secondary | ICD-10-CM

## 2017-08-12 DIAGNOSIS — J189 Pneumonia, unspecified organism: Secondary | ICD-10-CM

## 2017-08-12 DIAGNOSIS — Z7901 Long term (current) use of anticoagulants: Secondary | ICD-10-CM | POA: Diagnosis not present

## 2017-08-13 DIAGNOSIS — I5032 Chronic diastolic (congestive) heart failure: Secondary | ICD-10-CM | POA: Diagnosis not present

## 2017-08-13 DIAGNOSIS — N184 Chronic kidney disease, stage 4 (severe): Secondary | ICD-10-CM | POA: Diagnosis not present

## 2017-08-13 DIAGNOSIS — Z48812 Encounter for surgical aftercare following surgery on the circulatory system: Secondary | ICD-10-CM | POA: Diagnosis not present

## 2017-08-13 DIAGNOSIS — J189 Pneumonia, unspecified organism: Secondary | ICD-10-CM | POA: Diagnosis not present

## 2017-08-13 DIAGNOSIS — J440 Chronic obstructive pulmonary disease with acute lower respiratory infection: Secondary | ICD-10-CM | POA: Diagnosis not present

## 2017-08-13 DIAGNOSIS — I130 Hypertensive heart and chronic kidney disease with heart failure and stage 1 through stage 4 chronic kidney disease, or unspecified chronic kidney disease: Secondary | ICD-10-CM | POA: Diagnosis not present

## 2017-08-13 LAB — CUP PACEART INCLINIC DEVICE CHECK
Brady Statistic RA Percent Paced: 1.8 %
Brady Statistic RV Percent Paced: 6.7 %
Date Time Interrogation Session: 20190805192729
Implantable Lead Implant Date: 20190725
Implantable Lead Location: 753859
Implantable Lead Location: 753860
Lead Channel Impedance Value: 600 Ohm
Lead Channel Pacing Threshold Amplitude: 1 V
Lead Channel Pacing Threshold Amplitude: 1 V
Lead Channel Pacing Threshold Pulse Width: 0.5 ms
Lead Channel Pacing Threshold Pulse Width: 0.5 ms
Lead Channel Sensing Intrinsic Amplitude: 2.6 mV
Lead Channel Setting Pacing Amplitude: 1.25 V
Lead Channel Setting Pacing Amplitude: 3.5 V
Lead Channel Setting Pacing Pulse Width: 0.5 ms
Lead Channel Setting Sensing Sensitivity: 2 mV
MDC IDC LEAD IMPLANT DT: 20190725
MDC IDC MSMT BATTERY REMAINING LONGEVITY: 134 mo
MDC IDC MSMT BATTERY VOLTAGE: 3.13 V
MDC IDC MSMT LEADCHNL RA IMPEDANCE VALUE: 537.5 Ohm
MDC IDC MSMT LEADCHNL RA PACING THRESHOLD AMPLITUDE: 1 V
MDC IDC MSMT LEADCHNL RA PACING THRESHOLD PULSEWIDTH: 0.5 ms
MDC IDC MSMT LEADCHNL RV PACING THRESHOLD AMPLITUDE: 1 V
MDC IDC MSMT LEADCHNL RV PACING THRESHOLD PULSEWIDTH: 0.5 ms
MDC IDC MSMT LEADCHNL RV SENSING INTR AMPL: 9.5 mV
MDC IDC PG IMPLANT DT: 20190725
Pulse Gen Model: 2272
Pulse Gen Serial Number: 9047588

## 2017-08-13 NOTE — Progress Notes (Signed)
Wound check appointment. Steri-strips removed. Wound without redness or edema. Incision edges approximated, wound well healed. Normal device function. Thresholds, sensing, and impedances consistent with implant measurements. Device programmed at 3.5V (RA) for extra safety margin until 3 month visit. RV auto cap programmed on at implant. PVARP decreased to 223ms. VA block noted. Histogram distribution appropriate for patient and level of activity. No mode switches or high ventricular rates noted. Patient educated about wound care, arm mobility, lifting restrictions. ROV in 3 months with WC.  Patient states that he's been out of his Eliquis since yesterday. [facility] - pharmacist states that [facility] already sent the medication through patient's insurance, so pt would need to pay out of pocket for the prescription. Samples x 1 week given to patient. [facility]. Patient was counseled about the importance of continuing the anticoagulant especially since he has a h/o previous CVA. Patient/son verbalized understanding.

## 2017-08-14 ENCOUNTER — Other Ambulatory Visit: Payer: Self-pay | Admitting: Internal Medicine

## 2017-08-14 ENCOUNTER — Other Ambulatory Visit: Payer: Self-pay | Admitting: *Deleted

## 2017-08-14 ENCOUNTER — Emergency Department (HOSPITAL_COMMUNITY): Payer: Medicare Other

## 2017-08-14 ENCOUNTER — Other Ambulatory Visit: Payer: Self-pay

## 2017-08-14 ENCOUNTER — Ambulatory Visit: Payer: Medicare Other | Admitting: Cardiology

## 2017-08-14 ENCOUNTER — Emergency Department (HOSPITAL_COMMUNITY)
Admission: EM | Admit: 2017-08-14 | Discharge: 2017-08-14 | Disposition: A | Discharge status: Home / Self Care | Payer: Medicare Other | Attending: Emergency Medicine | Admitting: Emergency Medicine

## 2017-08-14 DIAGNOSIS — J449 Chronic obstructive pulmonary disease, unspecified: Secondary | ICD-10-CM | POA: Diagnosis not present

## 2017-08-14 DIAGNOSIS — Z7901 Long term (current) use of anticoagulants: Secondary | ICD-10-CM | POA: Insufficient documentation

## 2017-08-14 DIAGNOSIS — R079 Chest pain, unspecified: Secondary | ICD-10-CM | POA: Diagnosis not present

## 2017-08-14 DIAGNOSIS — Z87891 Personal history of nicotine dependence: Secondary | ICD-10-CM | POA: Insufficient documentation

## 2017-08-14 DIAGNOSIS — Z8673 Personal history of transient ischemic attack (TIA), and cerebral infarction without residual deficits: Secondary | ICD-10-CM | POA: Diagnosis not present

## 2017-08-14 DIAGNOSIS — Z950 Presence of cardiac pacemaker: Secondary | ICD-10-CM | POA: Diagnosis not present

## 2017-08-14 DIAGNOSIS — N184 Chronic kidney disease, stage 4 (severe): Secondary | ICD-10-CM | POA: Insufficient documentation

## 2017-08-14 DIAGNOSIS — Z85810 Personal history of malignant neoplasm of tongue: Secondary | ICD-10-CM | POA: Insufficient documentation

## 2017-08-14 DIAGNOSIS — R0789 Other chest pain: Secondary | ICD-10-CM | POA: Diagnosis not present

## 2017-08-14 DIAGNOSIS — I130 Hypertensive heart and chronic kidney disease with heart failure and stage 1 through stage 4 chronic kidney disease, or unspecified chronic kidney disease: Secondary | ICD-10-CM | POA: Insufficient documentation

## 2017-08-14 DIAGNOSIS — I5032 Chronic diastolic (congestive) heart failure: Secondary | ICD-10-CM | POA: Insufficient documentation

## 2017-08-14 DIAGNOSIS — Z79899 Other long term (current) drug therapy: Secondary | ICD-10-CM | POA: Insufficient documentation

## 2017-08-14 DIAGNOSIS — M25512 Pain in left shoulder: Secondary | ICD-10-CM | POA: Insufficient documentation

## 2017-08-14 LAB — CBC
HEMATOCRIT: 37.6 % — AB (ref 39.0–52.0)
Hemoglobin: 12.1 g/dL — ABNORMAL LOW (ref 13.0–17.0)
MCH: 27.6 pg (ref 26.0–34.0)
MCHC: 32.2 g/dL (ref 30.0–36.0)
MCV: 85.6 fL (ref 78.0–100.0)
Platelets: 289 10*3/uL (ref 150–400)
RBC: 4.39 MIL/uL (ref 4.22–5.81)
RDW: 14.5 % (ref 11.5–15.5)
WBC: 15 10*3/uL — ABNORMAL HIGH (ref 4.0–10.5)

## 2017-08-14 LAB — BASIC METABOLIC PANEL
Anion gap: 11 (ref 5–15)
BUN: 10 mg/dL (ref 8–23)
CHLORIDE: 100 mmol/L (ref 98–111)
CO2: 23 mmol/L (ref 22–32)
Calcium: 9.3 mg/dL (ref 8.9–10.3)
Creatinine, Ser: 1.49 mg/dL — ABNORMAL HIGH (ref 0.61–1.24)
GFR calc Af Amer: 48 mL/min — ABNORMAL LOW (ref >60)
GFR calc non Af Amer: 41 mL/min — ABNORMAL LOW (ref >60)
GLUCOSE: 107 mg/dL — AB (ref 70–99)
POTASSIUM: 4.1 mmol/L (ref 3.5–5.1)
Sodium: 134 mmol/L — ABNORMAL LOW (ref 135–145)

## 2017-08-14 LAB — I-STAT TROPONIN, ED: Troponin i, poc: 0.01 ng/mL (ref 0.00–0.08)

## 2017-08-14 MED ORDER — AMLODIPINE BESYLATE 2.5 MG PO TABS: 2.5000 mg | ORAL_TABLET | Freq: Every day | ORAL | 3 refills | Status: DC

## 2017-08-14 MED ORDER — FAMOTIDINE 20 MG PO TABS: 20.0000 mg | ORAL_TABLET | Freq: Every day | ORAL | 1 refills | Status: DC

## 2017-08-14 MED ORDER — PANTOPRAZOLE SODIUM 40 MG PO TBEC: 40.0000 mg | DELAYED_RELEASE_TABLET | Freq: Every day | ORAL | 1 refills | Status: DC

## 2017-08-14 NOTE — ED Triage Notes (Signed)
Pt endorses pain around pacemaker site, pace maker placed 2 weeks ago. Also endorses some shob. VSS

## 2017-08-14 NOTE — ED Provider Notes (Signed)
[facility] EMERGENCY DEPARTMENT Provider Note   CSN: [id number] Arrival date & time: 08/14/17  1054     History   Chief Complaint Chief Complaint  Patient presents with  . Pain around pacemaker insertion site    HPI Chad [name] is a 84 y.o. male.  He had a recent pacemaker placement about 2-1/2 weeks ago.  He is been at home doing well until today when he was experiencing some pain at the pacer site.  He had been told earlier if he had any pain there that he should be seen in the emergency department.  The only thing he can think of that is different is he took a shower by himself today and he has been using that arm more and uses that left arm to get himself off of the toilet because that is where the assist bar is.  There is been no fall no specific injury.  No fever no shortness of breath no chest pain.  He denies any pain currently.  The history is provided by the patient.  Shoulder Pain   This is a new problem. The current episode started 1 to 2 hours ago. The problem has been resolved. The pain is present in the left shoulder. The quality of the pain is described as sharp. The pain is moderate. Pertinent negatives include no numbness. The symptoms are aggravated by activity. He has tried rest for the symptoms. The treatment provided significant relief. There has been no history of extremity trauma.    Past Medical History:  Diagnosis Date  . Arthritis    "hands" (01/12/2013); feet (podiatry consultation); "back" (06/12/2017)  . Atrial flutter ([place])    a. Slow ~100bpm when in 2:1; dx 04/2012. b. Placed on Xarelto.  c. s/p ablation 07-08-2012 by Dr Rayann Heman  . Carotid artery calcification    By CXR - dopplers 05/06/12 without obvious evidence of significant ICA stenosis >40%  . Colon polyps 06/26/2005  . COPD (chronic obstructive pulmonary disease) ([place])   . Diverticulosis of colon (without mention of hemorrhage)   . Gastritis, chronic    Pt denies bleeding 04/2012.  EGD 2012 reportedly normal.  . GERD (gastroesophageal reflux disease)   . Hypertension   . Hyponatremia    04/2012 r/t diuretic.  . IBS (irritable bowel syndrome)   . Internal and external hemorrhoids without complication   . Macular degeneration of left eye   . Pneumonia    "@ least 5 times" (06/12/2017)  . Stroke ([place])   . Tongue cancer ([place]) 02/1997    Patient Active Problem List   Diagnosis Date Noted  . Syncope 08/01/2017  . Wart viral 06/17/2017  . Bradycardia   . Calcium channel blocker overdose   . Back pain 06/11/2017  . Symptomatic bradycardia 06/11/2017  . Hypertension 06/11/2017  . 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 History of CVA (cerebrovascular accident)-May 2019 06/11/2017  . Acute hyponatremia 06/11/2017  . Chronic kidney disease (CKD), stage IV (severe) ([place]) 06/11/2017  . Chronic diastolic heart failure ([place]) 06/11/2017  . HLD (hyperlipidemia) 06/11/2017  . Paroxysmal atrial flutter ([place]) 06/11/2017  . Cerebral embolism with cerebral infarction 06/04/2017  . CVA (cerebral vascular accident) ([place]) 06/04/2017  . Stroke-like symptoms 06/03/2017  . Bronchiectasis without complication ([place]) 40/98/1191  . DOE (dyspnea on exertion) 04/24/2017  . Cough in adult 02/13/2017  . Flatulence 02/13/2017  . Hemoptysis 06/12/2016  . Creatinine elevation 06/12/2016  . Eyebrow laceration, right, sequela 05/18/2016  .  Abnormal TSH 05/18/2016  . Well adult exam 04/20/2016  . Enlarged prostate 04/20/2016  . Community acquired pneumonia   . Pneumonia of both lungs due to infectious organism 03/02/2016  . 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 Anal lesion 06/07/2015  . Gas 01/18/2015  . Aortic stenosis 06/08/2013  . Unsteadiness 02/11/2013  . Ectopic atrial tachycardia ([place]) 01/12/2013  . Atrial tachycardia ([place]) 01/12/2013  . Atrial flutter ([place]) 05/06/2012  . Aortic valve sclerosis 05/06/2012  . Acute renal  failure ([place]) 05/05/2012  . 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 Anemia 07/27/2010  . Essential hypertension 11/15/2008  . COPD (chronic obstructive pulmonary disease) with chronic bronchitis ([place]) 11/15/2008  . GERD 11/15/2008  . IBS 11/15/2008  . Midsternal chest pain 11/15/2008    Past Surgical History:  Procedure Laterality Date  . ATRIAL FLUTTER ABLATION N/A 07/08/2012   Procedure: ATRIAL FLUTTER ABLATION;  Surgeon: Thompson Grayer, MD;  Location: [facility] CATH LAB;  Service: Cardiovascular;  Laterality: N/A;  . CARDIAC CATHETERIZATION  04/2012  . CATARACT EXTRACTION W/ INTRAOCULAR LENS IMPLANT Left   . COLONOSCOPY  08/29/10   diverticulosis, internal hemorrhoids  . ESOPHAGOGASTRODUODENOSCOPY  09/20/10   normal  . [place]  . LEFT HEART CATHETERIZATION WITH CORONARY ANGIOGRAM N/A 05/06/2012   Procedure: LEFT HEART CATHETERIZATION WITH CORONARY ANGIOGRAM;  Surgeon: Peter M Martinique, MD;  Location: [facility] CATH LAB;  Service: Cardiovascular;  Laterality: N/A;  . 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 PACEMAKER IMPLANT N/A 08/01/2017   Procedure: PACEMAKER IMPLANT;  Surgeon: Constance Haw, MD;  Location: [facility] CV LAB;  Service: Cardiovascular;  Laterality: N/A;  . TRIGGER FINGER RELEASE Right 1970's   "2 fingers"  . WART FULGURATION Left 09/15/2015   Procedure: EXCISIONal biospy of left peri anual and anual canal mass;  Surgeon: Greer Pickerel, MD;  Location: [facility] ORS;  Service: General;  Laterality: Left;        Home Medications    Prior to Admission medications   Medication Sig Start Date End Date Taking? Authorizing Provider  amLODipine (NORVASC) 2.5 MG tablet Take 1 tablet (2.5 mg total) by mouth daily. 08/14/17  Yes Lelon Perla, MD  apixaban (ELIQUIS) 2.5 MG TABS tablet Take 1 tablet (2.5 mg total) by mouth 2 (two) times daily. Resume 7/28/19PM 08/07/17  Yes Lelon Perla, MD  atorvastatin  (LIPITOR) 40 MG tablet Take 1 tablet (40 mg total) by mouth daily at 6 PM. 06/05/17  Yes Gherghe, Vella Redhead, MD  cholecalciferol (VITAMIN D) 1000 units tablet Take 1 tablet (1,000 Units total) by mouth daily. 10/15/16  Yes Plotnikov, Evie Lacks, MD  clidinium-chlordiazePOXIDE (LIBRAX) 5-2.5 MG capsule TAKE 1 CAPSULE BY MOUTH TWICE DAILY 04/01/17  Yes Plotnikov, Evie Lacks, MD  cyanocobalamin 1000 MCG tablet Take 1,000 mcg by mouth daily.    Yes [provider]  famotidine (PEPCID) 20 MG tablet One at bedtime Patient taking differently: Take 20 mg by mouth at bedtime.  05/27/17  Yes Tanda Rockers, MD  fluticasone (FLONASE) 50 MCG/ACT nasal spray Place 2 sprays into both nostrils daily. Patient taking differently: Place 2 sprays into both nostrils at bedtime.  07/22/16  Yes Plotnikov, Evie Lacks, MD  Glycopyrrolate [facility] MAGNAIR REFILL KIT) 25 MCG/ML SOLN Inhale 1 Dose into the lungs daily. 08/09/17  Yes Plotnikov, Evie Lacks, MD  methylPREDNISolone (MEDROL DOSEPAK) 4 MG TBPK tablet As directed Patient taking differently:  Take 4-32 mg by mouth as directed. As directed 08/09/17  Yes Plotnikov, Evie Lacks, MD  multivitamin-lutein (OCUVITE-LUTEIN) CAPS capsule Take 1 capsule by mouth daily.    Yes [provider]  pantoprazole (PROTONIX) 40 MG tablet Take 1 tablet (40 mg total) by mouth daily. Take 30-60 min before first meal of the day 05/27/17  Yes Wert, Christena Deem, MD  polyethylene glycol powder (GLYCOLAX/MIRALAX) powder Take 17 g by mouth 2 (two) times daily as needed. Patient taking differently: Take 17 g by mouth 2 (two) times daily as needed for mild constipation.  03/14/16  Yes Wardell Honour, MD  promethazine-codeine [facility] WITH CODEINE) 6.25-10 MG/5ML syrup Take 5 mLs by mouth every 4 (four) hours as needed. Patient taking differently: Take 5 mLs by mouth every 4 (four) hours as needed for cough.  07/29/17  Yes Plotnikov, Evie Lacks, MD  senna-docusate (SENOKOT-S) 8.6-50 MG tablet Take 1  tablet by mouth daily.   Yes [provider]  tetrahydrozoline 0.05 % ophthalmic solution Place 1 drop into both eyes as needed (dry eyes).   Yes [provider]  Aclidinium Bromide (TUDORZA PRESSAIR) 400 MCG/ACT AEPB Inhale 1 Act into the lungs 2 (two) times daily. Patient not taking: Reported on 08/14/2017 07/29/17   Plotnikov, Evie Lacks, MD  gabapentin (NEURONTIN) 300 MG capsule Take 3 capsules at bedtime. Patient not taking: Reported on 08/12/2017 07/22/16   Plotnikov, Evie Lacks, MD    Family History Family History  Problem Relation Age of Onset  . Heart disease Brother   . Throat cancer Brother   . Other Mother        UNSURE  . Other Father        UNSURE  . Emphysema Sister   . Emphysema Sister   . Other Brother        UNSURE  . Diabetes Unknown     Social History Social History   Tobacco Use  . Smoking status: Former Smoker    Packs/day: 0.50    Years: 20.00    Pack years: 10.00    Types: Cigarettes, Pipe, Cigars    Last attempt to quit: 12/08/1996    Years since quitting: 20.6  . Smokeless tobacco: Never Used  . Tobacco comment:    Substance Use Topics  . Alcohol use: No    Alcohol/week: 0.0 oz    Frequency: Never    Comment: last since 1998  . Drug use: Never     Allergies   Dyazide [hydrochlorothiazide w-triamterene] and Lisinopril   Review of Systems Review of Systems  Constitutional: Negative for fever.  HENT: Negative for sore throat.   Eyes: Negative for visual disturbance.  Respiratory: Negative for shortness of breath.   Cardiovascular: Negative for chest pain.  Gastrointestinal: Negative for abdominal pain.  Genitourinary: Negative for dysuria.  Musculoskeletal: Negative for neck pain.  Skin: Negative for rash.  Neurological: Negative for numbness.     Physical Exam Updated Vital Signs BP (!) 141/77   Pulse 75   Temp 97.7 F (36.5 C) (Oral)   Resp 19   Ht 6' 1"  (1.854 m)   Wt 85.3 kg (188 lb)   SpO2 100%   BMI 24.80  kg/m   Physical Exam  Constitutional: He appears well-developed and well-nourished.  HENT:  Head: Normocephalic and atraumatic.  Eyes: Conjunctivae are normal.  Neck: Neck supple.  Cardiovascular: Normal rate, regular rhythm, normal heart sounds and intact distal pulses.  Patient has a pacemaker in his left  upper chest.  Scar is intact and well-healed.  There is no overlying erythema no tenderness and no fluctuance in the pocket.  There is no reproducible tenderness moving the shoulder .   Pulmonary/Chest: Effort normal and breath sounds normal. He has no wheezes. He has no rales.  Abdominal: Soft. He exhibits no mass.  Musculoskeletal: Normal range of motion. He exhibits no tenderness or deformity.  Neurological: He is alert. GCS eye subscore is 4. GCS verbal subscore is 5. GCS motor subscore is 6.  Skin: Skin is warm and dry.  Psychiatric: He has a normal mood and affect.  Nursing note and vitals reviewed.    ED Treatments / Results  Labs (all labs ordered are listed, but only abnormal results are displayed) Labs Reviewed  BASIC METABOLIC PANEL - Abnormal; Notable for the following components:      Result Value   Sodium 134 (*)    Glucose, Bld 107 (*)    Creatinine, Ser 1.49 (*)    GFR calc non Af Amer 41 (*)    GFR calc Af Amer 48 (*)    All other components within normal limits  CBC - Abnormal; Notable for the following components:   WBC 15.0 (*)    Hemoglobin 12.1 (*)    HCT 37.6 (*)    All other components within normal limits  I-STAT TROPONIN, ED    EKG EKG Interpretation  Date/Time:  Wednesday August 14 2017 11:41:17 EDT Ventricular Rate:  101 PR Interval:    QRS Duration: 120 QT Interval:  366 QTC Calculation: 474 R Axis:   -46 Text Interpretation:  Ventricular-paced rhythm Abnormal ECG new pacer spikes and increased rate from prior 7/19 Confirmed by Aletta Edouard [id number]) on 08/14/2017 1:15:48 PM   Radiology Dg Chest 2 View  Result Date:  08/14/2017 CLINICAL DATA:  Chest pain around the pacemaker site. Shortness of breath. EXAM: CHEST - 2 VIEW COMPARISON:  Chest x-ray dated 08/02/2017 and chest CT dated 08/01/2017 FINDINGS: Chronic mild cardiomegaly. Pulmonary vascularity is normal. Aortic atherosclerosis. Pacemaker in place, unchanged. Chronic interstitial disease and bronchiectasis at the lung bases. The infiltrates noted on the prior exam have resolved. No effusions. No significant bone abnormality. IMPRESSION: No acute abnormalities. Interval clearing of the infiltrates at the lung bases. Chronic interstitial lung disease at the bases with previously demonstrated bronchiectasis. Aortic Atherosclerosis (ICD10-I70.0). Electronically Signed   By: Lorriane Shire M.D.   On: 08/14/2017 12:15    Procedures Procedures (including critical care time)  Medications Ordered in ED Medications - No data to display   Initial Impression / Assessment and Plan / ED Course  I have reviewed the triage vital signs and the nursing notes.  Pertinent labs & imaging results that were available during my care of the patient were reviewed by me and considered in my medical decision making (see chart for details).  Clinical Course as of Aug 15 917  Wed Aug 14, 2017  1335 On clinical exam there is no evidence of a pacer pocket infection.  By EKG the pacemaker is functioning.  He does have a elevation of his WBCs but denies any infectious symptoms.  His creatinine is improved from baseline.  First troponin negative.  Chest x-ray shows no acute findings.  Due to the fact the patient has increase his activity think this pain at the pacemaker site likely reflects musculoskeletal.  I did inform cardiology and they will make him an appointment to follow-up in the office.   [MB]  Clinical Course User Index [MB] Hayden Rasmussen, MD     Final Clinical Impressions(s) / ED Diagnoses   Final diagnoses:  Acute pain of left shoulder    ED Discharge Orders     None       Hayden Rasmussen, MD 08/15/17 [phone]

## 2017-08-14 NOTE — ED Notes (Signed)
Pt reports pain down left arm this morning, denies CP.

## 2017-08-14 NOTE — ED Notes (Signed)
Discharge instructions discussed with Pt. Pt verbalized understanding. Pt stable and ambulatory.    

## 2017-08-14 NOTE — Discharge Instructions (Addendum)
You were seen in the emergency department for left shoulder pain.  This is at the site where he recently had a pacemaker placed.  We do not see any obvious signs of infection or a problem with the pacemaker.  This pain is likely muscular as you have been doing a lot more activity using her left arm.  Please take Tylenol as needed for pain and follow-up with the cardiology clinic.  Return if any concerns.

## 2017-08-14 NOTE — ED Notes (Signed)
Walked patient to the bathroom patient is now back in bed on the monitor with call bell in reach and family at bedside

## 2017-08-15 ENCOUNTER — Other Ambulatory Visit: Payer: Self-pay | Admitting: Internal Medicine

## 2017-08-15 DIAGNOSIS — J440 Chronic obstructive pulmonary disease with acute lower respiratory infection: Secondary | ICD-10-CM | POA: Diagnosis not present

## 2017-08-15 DIAGNOSIS — I5032 Chronic diastolic (congestive) heart failure: Secondary | ICD-10-CM | POA: Diagnosis not present

## 2017-08-15 DIAGNOSIS — I130 Hypertensive heart and chronic kidney disease with heart failure and stage 1 through stage 4 chronic kidney disease, or unspecified chronic kidney disease: Secondary | ICD-10-CM | POA: Diagnosis not present

## 2017-08-15 DIAGNOSIS — Z48812 Encounter for surgical aftercare following surgery on the circulatory system: Secondary | ICD-10-CM | POA: Diagnosis not present

## 2017-08-15 DIAGNOSIS — N184 Chronic kidney disease, stage 4 (severe): Secondary | ICD-10-CM | POA: Diagnosis not present

## 2017-08-15 DIAGNOSIS — J189 Pneumonia, unspecified organism: Secondary | ICD-10-CM | POA: Diagnosis not present

## 2017-08-20 DIAGNOSIS — Z48812 Encounter for surgical aftercare following surgery on the circulatory system: Secondary | ICD-10-CM | POA: Diagnosis not present

## 2017-08-20 DIAGNOSIS — N184 Chronic kidney disease, stage 4 (severe): Secondary | ICD-10-CM | POA: Diagnosis not present

## 2017-08-20 DIAGNOSIS — J440 Chronic obstructive pulmonary disease with acute lower respiratory infection: Secondary | ICD-10-CM | POA: Diagnosis not present

## 2017-08-20 DIAGNOSIS — I130 Hypertensive heart and chronic kidney disease with heart failure and stage 1 through stage 4 chronic kidney disease, or unspecified chronic kidney disease: Secondary | ICD-10-CM | POA: Diagnosis not present

## 2017-08-20 DIAGNOSIS — J189 Pneumonia, unspecified organism: Secondary | ICD-10-CM | POA: Diagnosis not present

## 2017-08-20 DIAGNOSIS — I5032 Chronic diastolic (congestive) heart failure: Secondary | ICD-10-CM | POA: Diagnosis not present

## 2017-08-21 ENCOUNTER — Other Ambulatory Visit: Payer: Self-pay | Admitting: *Deleted

## 2017-08-21 MED ORDER — ATORVASTATIN CALCIUM 40 MG PO TABS: 40.0000 mg | ORAL_TABLET | Freq: Every day | ORAL | 1 refills | Status: DC

## 2017-08-21 MED ORDER — CILIDINIUM-CHLORDIAZEPOXIDE 2.5-5 MG PO CAPS: 1.0000 | ORAL_CAPSULE | Freq: Two times a day (BID) | ORAL | 1 refills | Status: DC

## 2017-08-23 ENCOUNTER — Other Ambulatory Visit: Payer: Self-pay | Admitting: *Deleted

## 2017-08-23 DIAGNOSIS — Z48812 Encounter for surgical aftercare following surgery on the circulatory system: Secondary | ICD-10-CM | POA: Diagnosis not present

## 2017-08-23 DIAGNOSIS — J440 Chronic obstructive pulmonary disease with acute lower respiratory infection: Secondary | ICD-10-CM | POA: Diagnosis not present

## 2017-08-23 DIAGNOSIS — I130 Hypertensive heart and chronic kidney disease with heart failure and stage 1 through stage 4 chronic kidney disease, or unspecified chronic kidney disease: Secondary | ICD-10-CM | POA: Diagnosis not present

## 2017-08-23 DIAGNOSIS — I5032 Chronic diastolic (congestive) heart failure: Secondary | ICD-10-CM | POA: Diagnosis not present

## 2017-08-23 DIAGNOSIS — N184 Chronic kidney disease, stage 4 (severe): Secondary | ICD-10-CM | POA: Diagnosis not present

## 2017-08-23 DIAGNOSIS — J189 Pneumonia, unspecified organism: Secondary | ICD-10-CM | POA: Diagnosis not present

## 2017-08-23 NOTE — Patient Outreach (Signed)
[facility] [place] [facility]) Care Management  08/23/2017  Chad [name] 24-Apr-1931 [id number]   Call placed to follow up on current health condition.  He report he is doing well, state he feel his strength is increasing with the help of physical therapy.   Denies any pain at pacer insertion site, reports itching.  Advised against scratching and peeling steri strips off, allowing wound to heal and strips fall off on its own.  He verbalizes understanding.  Denies any urgent concerns at this time.  Agrees to home visit within the next 2 weeks.  Valente David, [place], MSN [place] [phone]

## 2017-08-27 ENCOUNTER — Ambulatory Visit (INDEPENDENT_AMBULATORY_CARE_PROVIDER_SITE_OTHER): Payer: Medicare Other | Admitting: Internal Medicine

## 2017-08-27 ENCOUNTER — Encounter: Payer: Self-pay | Admitting: Internal Medicine

## 2017-08-27 VITALS — BP 124/72 | HR 75 | Temp 97.8°F | Ht 73.0 in | Wt 177.0 lb

## 2017-08-27 DIAGNOSIS — I4892 Unspecified atrial flutter: Secondary | ICD-10-CM

## 2017-08-27 DIAGNOSIS — J479 Bronchiectasis, uncomplicated: Secondary | ICD-10-CM

## 2017-08-27 DIAGNOSIS — I10 Essential (primary) hypertension: Secondary | ICD-10-CM | POA: Diagnosis not present

## 2017-08-27 DIAGNOSIS — I5032 Chronic diastolic (congestive) heart failure: Secondary | ICD-10-CM

## 2017-08-27 DIAGNOSIS — I639 Cerebral infarction, unspecified: Secondary | ICD-10-CM

## 2017-08-27 DIAGNOSIS — R059 Cough, unspecified: Secondary | ICD-10-CM

## 2017-08-27 DIAGNOSIS — J0191 Acute recurrent sinusitis, unspecified: Secondary | ICD-10-CM

## 2017-08-27 DIAGNOSIS — R05 Cough: Secondary | ICD-10-CM | POA: Diagnosis not present

## 2017-08-27 MED ORDER — GLYCOPYRROLATE 25 MCG/ML IN SOLN: 1.0000 | Freq: Every day | RESPIRATORY_TRACT | 3 refills | Status: DC

## 2017-08-27 NOTE — Progress Notes (Signed)
Subjective:  Patient ID: Chad [name], male    DOB: 05/21/1931  Age: 84 y.o. MRN: [id number]  CC: No chief complaint on file.   HPI Chad [name] presents for ongoing cough since his last hospital stay - worse in the mornings. [place] helped some. Thick white mucus in the sinuses in am. Comes w/son F/u HTN, A fib  Outpatient Medications Prior to Visit  Medication Sig Dispense Refill  . Aclidinium Bromide (TUDORZA PRESSAIR) 400 MCG/ACT AEPB Inhale 1 Act into the lungs 2 (two) times daily. 3 each 3  . amLODipine (NORVASC) 2.5 MG tablet Take 1 tablet (2.5 mg total) by mouth daily. 90 tablet 3  . apixaban (ELIQUIS) 2.5 MG TABS tablet Take 1 tablet (2.5 mg total) by mouth 2 (two) times daily. Resume 7/28/19PM 180 tablet 1  . atorvastatin (LIPITOR) 40 MG tablet Take 1 tablet (40 mg total) by mouth daily at 6 PM. 90 tablet 1  . cholecalciferol (VITAMIN D) 1000 units tablet Take 1 tablet (1,000 Units total) by mouth daily. 100 tablet 3  . clidinium-chlordiazePOXIDE (LIBRAX) 5-2.5 MG capsule Take 1 capsule by mouth 2 (two) times daily. 180 capsule 1  . cyanocobalamin 1000 MCG tablet Take 1,000 mcg by mouth daily.     . famotidine (PEPCID) 20 MG tablet Take 1 tablet (20 mg total) by mouth at bedtime. 90 tablet 1  . fluticasone (FLONASE) 50 MCG/ACT nasal spray USE 2 SPRAYS IN EACH NOSTRIL DAILY 48 g 3  . gabapentin (NEURONTIN) 300 MG capsule Take 3 capsules at bedtime. 270 capsule 3  . Glycopyrrolate (LONHALA MAGNAIR REFILL KIT) 25 MCG/ML SOLN Inhale 1 Dose into the lungs daily. 90 mL 3  . multivitamin-lutein (OCUVITE-LUTEIN) CAPS capsule Take 1 capsule by mouth daily.     . pantoprazole (PROTONIX) 40 MG tablet Take 1 tablet (40 mg total) by mouth daily. Take 30-60 min before first meal of the day 90 tablet 1  . polyethylene glycol powder (GLYCOLAX/MIRALAX) powder Take 17 g by mouth 2 (two) times daily as needed. (Patient taking differently: Take 17 g by mouth 2 (two) times daily as needed for  mild constipation. ) 3350 g 1  . promethazine-codeine (PHENERGAN WITH CODEINE) 6.25-10 MG/5ML syrup Take 5 mLs by mouth every 4 (four) hours as needed. (Patient taking differently: Take 5 mLs by mouth every 4 (four) hours as needed for cough. ) 300 mL 0  . senna-docusate (SENOKOT-S) 8.6-50 MG tablet Take 1 tablet by mouth daily.    [name] tetrahydrozoline 0.05 % ophthalmic solution Place 1 drop into both eyes as needed (dry eyes).    . methylPREDNISolone (MEDROL DOSEPAK) 4 MG TBPK tablet As directed (Patient not taking: Reported on 08/27/2017) 21 tablet 0   No facility-administered medications prior to visit.     ROS: Review of Systems  Constitutional: Positive for fatigue. Negative for appetite change and unexpected weight change.  HENT: Negative for congestion, nosebleeds, sneezing, sore throat and trouble swallowing.   Eyes: Negative for itching and visual disturbance.  Respiratory: Positive for cough.   Cardiovascular: Negative for chest pain, palpitations and leg swelling.  Gastrointestinal: Negative for abdominal distention, blood in stool, diarrhea and nausea.  Genitourinary: Negative for frequency and hematuria.  Musculoskeletal: Negative for back pain, gait problem, joint swelling and neck pain.  Skin: Negative for rash.  Neurological: Negative for dizziness, tremors, speech difficulty and weakness.  Psychiatric/Behavioral: Negative for agitation, dysphoric mood and sleep disturbance. The patient is not nervous/anxious.     Objective:  BP 124/72 (BP Location: Left Arm, Patient Position: Sitting, Cuff Size: Normal)   Pulse 75   Temp 97.8 F (36.6 C) (Oral)   Ht 6' 1"  (1.854 m)   Wt 177 lb (80.3 kg)   SpO2 94%   BMI 23.35 kg/m   BP Readings from Last 3 Encounters:  08/27/17 124/72  08/14/17 (!) 148/83  08/09/17 122/74    Wt Readings from Last 3 Encounters:  08/27/17 177 lb (80.3 kg)  08/14/17 188 lb (85.3 kg)  08/09/17 188 lb (85.3 kg)    Physical Exam    Constitutional: He is oriented to person, place, and time. He appears well-developed. No distress.  NAD  HENT:  Mouth/Throat: Oropharynx is clear and moist.  Eyes: Pupils are equal, round, and reactive to light. Conjunctivae are normal.  Neck: Normal range of motion. No JVD present. No thyromegaly present.  Cardiovascular: Normal rate, regular rhythm, normal heart sounds and intact distal pulses. Exam reveals no gallop and no friction rub.  No murmur heard. Pulmonary/Chest: Effort normal and breath sounds normal. No respiratory distress. He has no wheezes. He has no rales. He exhibits no tenderness.  Abdominal: Soft. Bowel sounds are normal. He exhibits no distension and no mass. There is no tenderness. There is no rebound and no guarding.  Musculoskeletal: Normal range of motion. He exhibits no edema or tenderness.  Lymphadenopathy:    He has no cervical adenopathy.  Neurological: He is alert and oriented to person, place, and time. He has normal reflexes. No cranial nerve deficit. He exhibits normal muscle tone. He displays a negative Romberg sign. Coordination and gait normal.  Skin: Skin is warm and dry. No rash noted.  Psychiatric: He has a normal mood and affect. His behavior is normal. Judgment and thought content normal.    Lab Results  Component Value Date   WBC 15.0 (H) 08/14/2017   HGB 12.1 (L) 08/14/2017   HCT 37.6 (L) 08/14/2017   PLT 289 08/14/2017   GLUCOSE 107 (H) 08/14/2017   CHOL 176 06/04/2017   TRIG 113 06/04/2017   HDL 31 (L) 06/04/2017   LDLCALC 122 (H) 06/04/2017   ALT 16 08/01/2017   AST 25 08/01/2017   NA 134 (L) 08/14/2017   K 4.1 08/14/2017   CL 100 08/14/2017   CREATININE 1.49 (H) 08/14/2017   BUN 10 08/14/2017   CO2 23 08/14/2017   TSH 5.393 (H) 06/11/2017   PSA 1.13 04/24/2016   INR 1.06 06/03/2017   HGBA1C 5.6 06/04/2017    Dg Chest 2 View  Result Date: 08/14/2017 CLINICAL DATA:  Chest pain around the pacemaker site. Shortness of breath.  EXAM: CHEST - 2 VIEW COMPARISON:  Chest x-ray dated 08/02/2017 and chest CT dated 08/01/2017 FINDINGS: Chronic mild cardiomegaly. Pulmonary vascularity is normal. Aortic atherosclerosis. Pacemaker in place, unchanged. Chronic interstitial disease and bronchiectasis at the lung bases. The infiltrates noted on the prior exam have resolved. No effusions. No significant bone abnormality. IMPRESSION: No acute abnormalities. Interval clearing of the infiltrates at the lung bases. Chronic interstitial lung disease at the bases with previously demonstrated bronchiectasis. Aortic Atherosclerosis (ICD10-I70.0). Electronically Signed   By: Lorriane Shire M.D.   On: 08/14/2017 12:15    Assessment & Plan:   There are no diagnoses linked to this encounter.   No orders of the defined types were placed in this encounter.    Follow-up: No follow-ups on file.  Walker Kehr, MD

## 2017-08-27 NOTE — Assessment & Plan Note (Signed)
Sinus CT

## 2017-08-27 NOTE — Assessment & Plan Note (Signed)
Eliquis 

## 2017-08-27 NOTE — Assessment & Plan Note (Signed)
Seems compensated Eliquis

## 2017-08-27 NOTE — Addendum Note (Signed)
Addended by: Aviva Signs M on: 08/27/2017 10:22 AM   Modules accepted: Orders

## 2017-08-27 NOTE — Assessment & Plan Note (Signed)
F/u w/Dr Wert 

## 2017-08-27 NOTE — Assessment & Plan Note (Signed)
Norvasc

## 2017-08-28 DIAGNOSIS — J189 Pneumonia, unspecified organism: Secondary | ICD-10-CM | POA: Diagnosis not present

## 2017-08-28 DIAGNOSIS — J440 Chronic obstructive pulmonary disease with acute lower respiratory infection: Secondary | ICD-10-CM | POA: Diagnosis not present

## 2017-08-28 DIAGNOSIS — I5032 Chronic diastolic (congestive) heart failure: Secondary | ICD-10-CM | POA: Diagnosis not present

## 2017-08-28 DIAGNOSIS — I130 Hypertensive heart and chronic kidney disease with heart failure and stage 1 through stage 4 chronic kidney disease, or unspecified chronic kidney disease: Secondary | ICD-10-CM | POA: Diagnosis not present

## 2017-08-28 DIAGNOSIS — Z48812 Encounter for surgical aftercare following surgery on the circulatory system: Secondary | ICD-10-CM | POA: Diagnosis not present

## 2017-08-28 DIAGNOSIS — N184 Chronic kidney disease, stage 4 (severe): Secondary | ICD-10-CM | POA: Diagnosis not present

## 2017-09-02 ENCOUNTER — Other Ambulatory Visit: Payer: Self-pay | Admitting: *Deleted

## 2017-09-02 NOTE — Patient Outreach (Signed)
[facility] Care Management   09/02/2017  COLMAN BIRDWELL 1931-08-31 [id number]  CLAUDIS GIOVANELLI is an 84 y.o. male  Subjective:   Member alert and oriented x3, denies complaints of pain or discomfort at this time.  Report compliance with medications, state he has appointments this week with cardiologist as well as for maxiollofacial CT.  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  He has been having increased coughing and decreased appetite.  Objective:   Review of Systems  Constitutional: Negative.   HENT: Negative.   Eyes: Negative.   Respiratory: Positive for cough.   Cardiovascular: Negative.   Gastrointestinal: Negative.   Genitourinary: Negative.   Musculoskeletal: Negative.   Skin: Negative.   Neurological: Negative.   Endo/Heme/Allergies: Negative.   Psychiatric/Behavioral: Negative.     Physical Exam  Constitutional: He is oriented to person, place, and time. He appears well-developed and well-nourished.  Neck: Normal range of motion.  Cardiovascular:  A-flutter  Respiratory: Effort normal and breath sounds normal.  GI: Soft. Bowel sounds are normal.  Musculoskeletal: Normal range of motion.  Neurological: He is alert and oriented to person, place, and time.  Skin: Skin is warm and dry.   BP 130/72 (BP Location: Right Arm, Patient Position: Sitting, Cuff Size: Normal)   Pulse 88   Resp 18   SpO2 98%   Encounter Medications:   Outpatient Encounter Medications as of 09/02/2017  Medication Sig  . Aclidinium Bromide (TUDORZA PRESSAIR) 400 MCG/ACT AEPB Inhale 1 Act into the lungs 2 (two) times daily.  [name] amLODipine (NORVASC) 2.5 MG tablet Take 1 tablet (2.5 mg total) by mouth daily.  [name] apixaban (ELIQUIS) 2.5 MG TABS tablet Take 1 tablet (2.5 mg total) by mouth 2 (two) times daily. Resume 7/28/19PM  . atorvastatin (LIPITOR) 40 MG tablet Take 1 tablet (40 mg total) by mouth daily at 6 PM.  . cholecalciferol (VITAMIN D) 1000 units tablet Take  1 tablet (1,000 Units total) by mouth daily.  . clidinium-chlordiazePOXIDE (LIBRAX) 5-2.5 MG capsule Take 1 capsule by mouth 2 (two) times daily.  . cyanocobalamin 1000 MCG tablet Take 1,000 mcg by mouth daily.   . famotidine (PEPCID) 20 MG tablet Take 1 tablet (20 mg total) by mouth at bedtime.  . fluticasone (FLONASE) 50 MCG/ACT nasal spray USE 2 SPRAYS IN EACH NOSTRIL DAILY  . Glycopyrrolate (LONHALA MAGNAIR REFILL KIT) 25 MCG/ML SOLN Inhale 1 Dose into the lungs daily.  . multivitamin-lutein (OCUVITE-LUTEIN) CAPS capsule Take 1 capsule by mouth daily.   . pantoprazole (PROTONIX) 40 MG tablet Take 1 tablet (40 mg total) by mouth daily. Take 30-60 min before first meal of the day  . polyethylene glycol powder (GLYCOLAX/MIRALAX) powder Take 17 g by mouth 2 (two) times daily as needed. (Patient taking differently: Take 17 g by mouth 2 (two) times daily as needed for mild constipation. )  . senna-docusate (SENOKOT-S) 8.6-50 MG tablet Take 1 tablet by mouth daily.  [name] tetrahydrozoline 0.05 % ophthalmic solution Place 1 drop into both eyes as needed (dry eyes).  . gabapentin (NEURONTIN) 300 MG capsule Take 3 capsules at bedtime. (Patient not taking: Reported on 09/02/2017)   No facility-administered encounter medications on file as of 09/02/2017.     Functional Status:   In your present state of health, do you have any difficulty performing the following activities: 08/07/2017 08/01/2017  Hearing? N -  Vision? Y -  Comment - -  Difficulty concentrating or making decisions? Y -  Comment - -  Walking or climbing stairs? Y -  Comment - -  Dressing or bathing? Y -  Doing errands, shopping? [place]pie Donning  Preparing Food and eating ? Y -  Using the Toilet? N -  In the past six months, have you accidently leaked urine? N -  Do you have problems with loss of bowel control? N -  Managing your Medications? N -  Managing your Finances? N -  Housekeeping or managing your Housekeeping? Y -  Some recent data might  be hidden    Fall/Depression Screening:    Fall Risk  08/07/2017 07/09/2017 06/17/2017  Falls in the past year? Yes No Yes  Number falls in past yr: 2 or more - 1  Injury with Fall? No - No  Comment - - -  Risk Factor Category  High Fall Risk - -  Risk for fall due to : History of fall(s) - -  Follow up Falls prevention discussed - -   PHQ 2/9 Scores 08/07/2017 06/17/2017 03/14/2016 02/22/2016 02/04/2016 12/06/2015 11/16/2015  PHQ - 2 Score 1 0 0 0 0 0 0    Assessment:    Met with member at scheduled time, ex-wife/caregiver present during visit.  He has been progressing through PT without issues.  State he has been not using his left arm, fearing he will "mess something up" with the pacemaker.  Reassurance provided that he will be able to increase strength back to his normal, advised to increase use of the arm as tolerated.    He has upcoming appointment with the [facility], will transition there for his primary care, will continue to have specialist in [place].  Denies any urgent concerns, advised to contact with questions.  Plan:   Will follow up with member within the next month.  If established with new primary at [facility], will close case at that time.  [facility] CM Care Plan Problem One     Most Recent Value  Care Plan Problem One  Risk for hopsitalization related to complications of pacemaker as evidenced by recent surgery/admission  Role Documenting the Problem One  Care Management [place] for Problem One  Active  [facility] Long Term Goal   Member will not be readmitted to hospital within the next 31 days  [facility] Long Term Goal Start Date  08/07/17  [facility] Long Term Goal Met Date  09/02/17  [facility] CM Short Term Goal #1   Member will keep and attend follow up appointments with primary MD, cardiology for wound check, and cardiology within the next 4 weeks  [facility] CM Short Term Goal #1 Start Date  08/07/17  [facility] CM Short Term Goal #1 Met Date  09/02/17  [facility] CM Short Term Goal #2   Member will report  involvement with home health for PT over the next 4 weeks  [facility] CM Short Term Goal #2 Start Date  08/07/17  [facility] CM Short Term Goal #2 Met Date  09/02/17     Valente David, RN, MSN [place] [phone]

## 2017-09-03 ENCOUNTER — Other Ambulatory Visit: Payer: Medicare Other

## 2017-09-03 NOTE — Progress Notes (Signed)
HPI:FU atrial tachycardia and aortic stenosis. Patient presented in April of 2014 with dizziness and noted to be in atrial flutter. Cardiac catheterization on 05/06/12 revealed normal coronary arteries. Carotid Dopplers April 2014 showed no significant stenosis. Patient had atrial flutter ablation on 07/08/2012. Echocardiogram March 2019 showed mild to moderate aortic stenosis with mean gradient 18 mmHg.  Patient had CVA May 2019.  Last echocardiogram May 2019 showed normal LV function, restrictive filling, mild mitral regurgitation, mild left atrial enlargement, moderate to severe tricuspid regurgitation and moderate pulmonic insufficiency.  There was noted that the aortic valve morphology suggested significant aortic stenosis but none based on gradients.  Carotid Dopplers May 2019 showed 1 to 39% bilateral stenosis.  Monitor June 2019 showed sinus to sinus tachycardia with first-degree AV block, paroxysmal atrial fibrillation, occasional junctional escape rhythm and pauses noted longest being 3.6 seconds.  Patient subsequently had pacemaker placed July 2019.  Since last seen,  patient denies dyspnea, chest pain, palpitations or syncope.  Current Outpatient Medications  Medication Sig Dispense Refill  . Aclidinium Bromide (TUDORZA PRESSAIR) 400 MCG/ACT AEPB Inhale 1 Act into the lungs 2 (two) times daily. 3 each 3  . amLODipine (NORVASC) 2.5 MG tablet Take 1 tablet (2.5 mg total) by mouth daily. 90 tablet 3  . apixaban (ELIQUIS) 2.5 MG TABS tablet Take 1 tablet (2.5 mg total) by mouth 2 (two) times daily. Resume 7/28/19PM 180 tablet 1  . atorvastatin (LIPITOR) 40 MG tablet Take 1 tablet (40 mg total) by mouth daily at 6 PM. 90 tablet 1  . cholecalciferol (VITAMIN D) 1000 units tablet Take 1 tablet (1,000 Units total) by mouth daily. 100 tablet 3  . clidinium-chlordiazePOXIDE (LIBRAX) 5-2.5 MG capsule Take 1 capsule by mouth 2 (two) times daily. 180 capsule 1  . cyanocobalamin 1000 MCG tablet Take  1,000 mcg by mouth daily.     . famotidine (PEPCID) 20 MG tablet Take 1 tablet (20 mg total) by mouth at bedtime. 90 tablet 1  . fluticasone (FLONASE) 50 MCG/ACT nasal spray USE 2 SPRAYS IN EACH NOSTRIL DAILY 48 g 3  . gabapentin (NEURONTIN) 300 MG capsule Take 3 capsules at bedtime. 270 capsule 3  . Glycopyrrolate (LONHALA MAGNAIR REFILL KIT) 25 MCG/ML SOLN Inhale 1 Dose into the lungs daily. 90 mL 3  . multivitamin-lutein (OCUVITE-LUTEIN) CAPS capsule Take 1 capsule by mouth daily.     . pantoprazole (PROTONIX) 40 MG tablet Take 1 tablet (40 mg total) by mouth daily. Take 30-60 min before first meal of the day 90 tablet 1  . polyethylene glycol powder (GLYCOLAX/MIRALAX) powder Take 17 g by mouth 2 (two) times daily as needed. (Patient taking differently: Take 17 g by mouth 2 (two) times daily as needed for mild constipation. ) 3350 g 1  . senna-docusate (SENOKOT-S) 8.6-50 MG tablet Take 1 tablet by mouth daily.    [name] tetrahydrozoline 0.05 % ophthalmic solution Place 1 drop into both eyes as needed (dry eyes).     No current facility-administered medications for this visit.      Past Medical History:  Diagnosis Date  . Arthritis    "hands" (01/12/2013); feet (podiatry consultation); "back" (06/12/2017)  . Atrial flutter ([place])    a. Slow ~100bpm when in 2:1; dx 04/2012. b. Placed on Xarelto.  c. s/p ablation 07-08-2012 by Dr Rayann Heman  . Carotid artery calcification    By CXR - dopplers 05/06/12 without obvious evidence of significant ICA stenosis >40%  . Colon polyps 06/26/2005  .  COPD (chronic obstructive pulmonary disease) ([place])   . Diverticulosis of colon (without mention of hemorrhage)   . Gastritis, chronic    Pt denies bleeding 04/2012. EGD 2012 reportedly normal.  . GERD (gastroesophageal reflux disease)   . Hypertension   . Hyponatremia    04/2012 r/t diuretic.  . IBS (irritable bowel syndrome)   . Internal and external hemorrhoids without complication   . Macular degeneration of left  eye   . Pneumonia    "@ least 5 times" (06/12/2017)  . Stroke ([place])   . Tongue cancer ([place]) 02/1997    Past Surgical History:  Procedure Laterality Date  . ATRIAL FLUTTER ABLATION N/A 07/08/2012   Procedure: ATRIAL FLUTTER ABLATION;  Surgeon: Thompson Grayer, MD;  Location: [facility] CATH LAB;  Service: Cardiovascular;  Laterality: N/A;  . CARDIAC CATHETERIZATION  04/2012  . CATARACT EXTRACTION W/ INTRAOCULAR LENS IMPLANT Left   . COLONOSCOPY  08/29/10   diverticulosis, internal hemorrhoids  . ESOPHAGOGASTRODUODENOSCOPY  09/20/10   normal  . [place]  . LEFT HEART CATHETERIZATION WITH CORONARY ANGIOGRAM N/A 05/06/2012   Procedure: LEFT HEART CATHETERIZATION WITH CORONARY ANGIOGRAM;  Surgeon: Peter M Martinique, MD;  Location: [facility] CATH LAB;  Service: Cardiovascular;  Laterality: N/A;  . 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 PACEMAKER IMPLANT N/A 08/01/2017   Procedure: PACEMAKER IMPLANT;  Surgeon: Constance Haw, MD;  Location: [facility] CV LAB;  Service: Cardiovascular;  Laterality: N/A;  . TRIGGER FINGER RELEASE Right 1970's   "2 fingers"  . WART FULGURATION Left 09/15/2015   Procedure: EXCISIONal biospy of left peri anual and anual canal mass;  Surgeon: Greer Pickerel, MD;  Location: [facility] ORS;  Service: General;  Laterality: Left;    Social History   Socioeconomic History  . Marital status: Divorced    Spouse name: Not on file  . Number of children: 2  . Years of education: 73  . Highest education level: Not on file  Occupational History  . Occupation: Retired  Scientific laboratory technician  . Financial resource strain: Not on file  . Food insecurity:    Worry: Not on file    Inability: Not on file  . Transportation needs:    Medical: Not on file    Non-medical: Not on file  Tobacco Use  . Smoking status: Former Smoker    Packs/day: 0.50    Years: 20.00    Pack years: 10.00    Types: Cigarettes, Pipe, Cigars    Last attempt to quit: 12/08/1996    Years since quitting:  20.7  . Smokeless tobacco: Never Used  . Tobacco comment:    Substance and Sexual Activity  . Alcohol use: No    Alcohol/week: 0.0 standard drinks    Frequency: Never    Comment: last since 1998  . Drug use: Never  . Sexual activity: Not Currently  Lifestyle  . Physical activity:    Days per week: Not on file    Minutes per session: Not on file  . Stress: Not on file  Relationships  . Social connections:    Talks on phone: Not on file    Gets together: Not on file    Attends religious service: Not on file    Active member of club or organization: Not on file    Attends meetings of clubs or organizations: Not on file    Relationship status: Not on file  . Intimate partner violence:    Fear of current  or ex partner: Not on file    Emotionally abused: Not on file    Physically abused: Not on file    Forced sexual activity: Not on file  Other Topics Concern  . Not on file  Social History Narrative   Marital status: divorced; lives with ex-wife; not dating in 2017.      Children:  2 sons, 2 grandsons, 1 granddaughter; no gg.      Lives: with ex-wife in house.      Employment: retired first time age 32; retired in 1997.  Barista x 25 years; Actor.      Tobacco: quit smoking 1997; smoked for 27 years.      Alcohol: quit 2004      Exercise:  Walking daily short distances.       ADLs:  NO assistant devices; has cane if needs it; drives; pays bill; grocery shopping by wife; wife cleans house and does laundry.     Advanced Directives:  +LIVING WILL; +FULL CODE.  HCPOA: oldest son Clayson Riling Sr.)    Family History  Problem Relation Age of Onset  . Heart disease Brother   . Throat cancer Brother   . Other Mother        UNSURE  . Other Father        UNSURE  . Emphysema Sister   . Emphysema Sister   . Other Brother        UNSURE  . Diabetes Unknown     ROS: no fevers or chills, productive cough, hemoptysis, dysphasia, odynophagia, melena,  hematochezia, dysuria, hematuria, rash, seizure activity, orthopnea, PND, pedal edema, claudication. Remaining systems are negative.  Physical Exam: Well-developed frail in no acute distress.  Skin is warm and dry.  HEENT is normal.  Neck is supple.  Chest is clear to auscultation with normal expansion. Pacemaker left chest Cardiovascular exam is regular rate and rhythm. 3/6 systolic murmur, S2 is diminished. Abdominal exam nontender or distended. No masses palpated. Extremities show no edema. neuro grossly intact   A/P  1 mild to moderate aortic stenosis-he will need follow-up echocardiograms in the future.  Note his aortic stenosis sounds worse on physical exam than noted on echocardiogram.  2 status post pacemaker-follow-up Dr. Curt Bears.  3 paroxysmal atrial fibrillation-continue apixaban.  4 hypertension-blood pressure is mildly elevated but controlled at home.  Continue present medications and follow.  5 chronic stage III kidney disease-follow-up primary care.  Kirk Ruths, MD

## 2017-09-04 ENCOUNTER — Ambulatory Visit: Payer: Medicare Other | Admitting: Cardiology

## 2017-09-04 ENCOUNTER — Ambulatory Visit (INDEPENDENT_AMBULATORY_CARE_PROVIDER_SITE_OTHER)
Admission: RE | Admit: 2017-09-04 | Discharge: 2017-09-04 | Disposition: A | Discharge status: Home / Self Care | Payer: Medicare Other | Source: Ambulatory Visit | Attending: Internal Medicine | Admitting: Internal Medicine

## 2017-09-04 ENCOUNTER — Encounter: Payer: Self-pay | Admitting: *Deleted

## 2017-09-04 DIAGNOSIS — J0191 Acute recurrent sinusitis, unspecified: Secondary | ICD-10-CM | POA: Diagnosis not present

## 2017-09-04 DIAGNOSIS — J189 Pneumonia, unspecified organism: Secondary | ICD-10-CM | POA: Diagnosis not present

## 2017-09-04 DIAGNOSIS — I5032 Chronic diastolic (congestive) heart failure: Secondary | ICD-10-CM | POA: Diagnosis not present

## 2017-09-04 DIAGNOSIS — J440 Chronic obstructive pulmonary disease with acute lower respiratory infection: Secondary | ICD-10-CM | POA: Diagnosis not present

## 2017-09-04 DIAGNOSIS — R05 Cough: Secondary | ICD-10-CM | POA: Diagnosis not present

## 2017-09-04 DIAGNOSIS — N184 Chronic kidney disease, stage 4 (severe): Secondary | ICD-10-CM | POA: Diagnosis not present

## 2017-09-04 DIAGNOSIS — I130 Hypertensive heart and chronic kidney disease with heart failure and stage 1 through stage 4 chronic kidney disease, or unspecified chronic kidney disease: Secondary | ICD-10-CM | POA: Diagnosis not present

## 2017-09-04 DIAGNOSIS — R059 Cough, unspecified: Secondary | ICD-10-CM

## 2017-09-04 DIAGNOSIS — Z48812 Encounter for surgical aftercare following surgery on the circulatory system: Secondary | ICD-10-CM | POA: Diagnosis not present

## 2017-09-04 DIAGNOSIS — R0982 Postnasal drip: Secondary | ICD-10-CM | POA: Diagnosis not present

## 2017-09-05 ENCOUNTER — Ambulatory Visit (INDEPENDENT_AMBULATORY_CARE_PROVIDER_SITE_OTHER): Payer: Medicare Other | Admitting: Cardiology

## 2017-09-05 ENCOUNTER — Encounter: Payer: Self-pay | Admitting: Cardiology

## 2017-09-05 VITALS — BP 147/83 | HR 70 | Ht 73.0 in | Wt 172.6 lb

## 2017-09-05 DIAGNOSIS — I10 Essential (primary) hypertension: Secondary | ICD-10-CM | POA: Diagnosis not present

## 2017-09-05 DIAGNOSIS — I639 Cerebral infarction, unspecified: Secondary | ICD-10-CM | POA: Diagnosis not present

## 2017-09-05 DIAGNOSIS — I350 Nonrheumatic aortic (valve) stenosis: Secondary | ICD-10-CM

## 2017-09-05 DIAGNOSIS — I4892 Unspecified atrial flutter: Secondary | ICD-10-CM

## 2017-09-05 NOTE — Patient Instructions (Signed)
Your physician wants you to follow-up in: 6 MONTHS WITH DR CRENSHAW You will receive a reminder letter in the mail two months in advance. If you don't receive a letter, please call our office to schedule the follow-up appointment.   If you need a refill on your cardiac medications before your next appointment, please call your pharmacy.  

## 2017-09-07 ENCOUNTER — Encounter (HOSPITAL_COMMUNITY): Payer: Self-pay | Admitting: Emergency Medicine

## 2017-09-07 ENCOUNTER — Emergency Department (HOSPITAL_COMMUNITY)
Admission: EM | Admit: 2017-09-07 | Discharge: 2017-09-07 | Disposition: A | Discharge status: Home / Self Care | Payer: Medicare Other | Attending: Emergency Medicine | Admitting: Emergency Medicine

## 2017-09-07 ENCOUNTER — Other Ambulatory Visit: Payer: Self-pay

## 2017-09-07 DIAGNOSIS — Z87891 Personal history of nicotine dependence: Secondary | ICD-10-CM | POA: Insufficient documentation

## 2017-09-07 DIAGNOSIS — Z79899 Other long term (current) drug therapy: Secondary | ICD-10-CM | POA: Insufficient documentation

## 2017-09-07 DIAGNOSIS — K921 Melena: Secondary | ICD-10-CM | POA: Insufficient documentation

## 2017-09-07 DIAGNOSIS — Z7901 Long term (current) use of anticoagulants: Secondary | ICD-10-CM | POA: Insufficient documentation

## 2017-09-07 DIAGNOSIS — J449 Chronic obstructive pulmonary disease, unspecified: Secondary | ICD-10-CM | POA: Diagnosis not present

## 2017-09-07 DIAGNOSIS — I4892 Unspecified atrial flutter: Secondary | ICD-10-CM | POA: Insufficient documentation

## 2017-09-07 DIAGNOSIS — N184 Chronic kidney disease, stage 4 (severe): Secondary | ICD-10-CM | POA: Diagnosis not present

## 2017-09-07 DIAGNOSIS — I130 Hypertensive heart and chronic kidney disease with heart failure and stage 1 through stage 4 chronic kidney disease, or unspecified chronic kidney disease: Secondary | ICD-10-CM | POA: Insufficient documentation

## 2017-09-07 DIAGNOSIS — I5032 Chronic diastolic (congestive) heart failure: Secondary | ICD-10-CM | POA: Diagnosis not present

## 2017-09-07 DIAGNOSIS — K625 Hemorrhage of anus and rectum: Secondary | ICD-10-CM | POA: Diagnosis present

## 2017-09-07 DIAGNOSIS — K922 Gastrointestinal hemorrhage, unspecified: Secondary | ICD-10-CM | POA: Diagnosis not present

## 2017-09-07 LAB — COMPREHENSIVE METABOLIC PANEL
ALK PHOS: 67 U/L (ref 38–126)
ALT: 18 U/L (ref 0–44)
ANION GAP: 15 (ref 5–15)
AST: 31 U/L (ref 15–41)
Albumin: 3.8 g/dL (ref 3.5–5.0)
BILIRUBIN TOTAL: 1 mg/dL (ref 0.3–1.2)
BUN: 12 mg/dL (ref 8–23)
CO2: 18 mmol/L — ABNORMAL LOW (ref 22–32)
CREATININE: 1.6 mg/dL — AB (ref 0.61–1.24)
Calcium: 9.2 mg/dL (ref 8.9–10.3)
Chloride: 102 mmol/L (ref 98–111)
GFR, EST AFRICAN AMERICAN: 44 mL/min — AB (ref >60)
GFR, EST NON AFRICAN AMERICAN: 38 mL/min — AB (ref >60)
Glucose, Bld: 105 mg/dL — ABNORMAL HIGH (ref 70–99)
Potassium: 4.1 mmol/L (ref 3.5–5.1)
Sodium: 135 mmol/L (ref 135–145)
TOTAL PROTEIN: 7.4 g/dL (ref 6.5–8.1)

## 2017-09-07 LAB — CBC
HCT: 42.9 % (ref 39.0–52.0)
Hemoglobin: 13.4 g/dL (ref 13.0–17.0)
MCH: 27.3 pg (ref 26.0–34.0)
MCHC: 31.2 g/dL (ref 30.0–36.0)
MCV: 87.4 fL (ref 78.0–100.0)
PLATELETS: 181 10*3/uL (ref 150–400)
RBC: 4.91 MIL/uL (ref 4.22–5.81)
RDW: 15.9 % — ABNORMAL HIGH (ref 11.5–15.5)
WBC: 8.4 10*3/uL (ref 4.0–10.5)

## 2017-09-07 LAB — TYPE AND SCREEN
ABO/RH(D): O NEG
ANTIBODY SCREEN: NEGATIVE

## 2017-09-07 LAB — POC OCCULT BLOOD, ED: Fecal Occult Bld: NEGATIVE

## 2017-09-07 NOTE — ED Triage Notes (Signed)
Pt states he is here for evaluation of 1 BM with red stool that he noticed earlier today, denies hemorrhoids. Pt also states he has not had an appetite for 6-8 months and is losing weight rapidly.

## 2017-09-07 NOTE — ED Provider Notes (Signed)
[facility] EMERGENCY DEPARTMENT Provider Note   CSN: [id number] Arrival date & time: 09/07/17  1812     History   Chief Complaint Chief Complaint  Patient presents with  . GI Bleeding    HPI Chad [name] is a 84 y.o. male.  The history is provided by the patient.  Rectal Bleeding  Quality:  Bright red Amount:  Scant Timing:  Intermittent Chronicity:  New Context: spontaneously   Context: not anal fissures, not anal penetration, not constipation, not defecation, not diarrhea and not rectal pain   Similar prior episodes: no   Relieved by:  Nothing Worsened by:  Wiping Associated symptoms: no abdominal pain, no dizziness, no epistaxis, no fever, no hematemesis, no light-headedness, no loss of consciousness, no recent illness and no vomiting   Risk factors: anticoagulant use   Risk factors: no liver disease     Past Medical History:  Diagnosis Date  . Arthritis    "hands" (01/12/2013); feet (podiatry consultation); "back" (06/12/2017)  . Atrial flutter ([place])    a. Slow ~100bpm when in 2:1; dx 04/2012. b. Placed on Xarelto.  c. s/p ablation 07-08-2012 by Dr Rayann Heman  . Carotid artery calcification    By CXR - dopplers 05/06/12 without obvious evidence of significant ICA stenosis >40%  . Colon polyps 06/26/2005  . COPD (chronic obstructive pulmonary disease) ([place])   . Diverticulosis of colon (without mention of hemorrhage)   . Gastritis, chronic    Pt denies bleeding 04/2012. EGD 2012 reportedly normal.  . GERD (gastroesophageal reflux disease)   . Hypertension   . Hyponatremia    04/2012 r/t diuretic.  . IBS (irritable bowel syndrome)   . Internal and external hemorrhoids without complication   . Macular degeneration of left eye   . Pneumonia    "@ least 5 times" (06/12/2017)  . Stroke ([place])   . Tongue cancer ([place]) 02/1997    Patient Active Problem List   Diagnosis Date Noted  . Syncope 08/01/2017  . Wart viral 06/17/2017  . Bradycardia   . Calcium  channel blocker overdose   . Back pain 06/11/2017  . Symptomatic bradycardia 06/11/2017  . Hypertension 06/11/2017  . 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 History of CVA (cerebrovascular accident)-May 2019 06/11/2017  . Acute hyponatremia 06/11/2017  . Chronic kidney disease (CKD), stage IV (severe) ([place]) 06/11/2017  . Chronic diastolic heart failure ([place]) 06/11/2017  . HLD (hyperlipidemia) 06/11/2017  . Paroxysmal atrial flutter ([place]) 06/11/2017  . Cerebral embolism with cerebral infarction 06/04/2017  . CVA (cerebral vascular accident) ([place]) 06/04/2017  . Stroke-like symptoms 06/03/2017  . Bronchiectasis without complication ([place]) 37/16/9678  . DOE (dyspnea on exertion) 04/24/2017  . Cough in adult 02/13/2017  . Flatulence 02/13/2017  . Hemoptysis 06/12/2016  . Creatinine elevation 06/12/2016  . Eyebrow laceration, right, sequela 05/18/2016  . Abnormal TSH 05/18/2016  . Well adult exam 04/20/2016  . Enlarged prostate 04/20/2016  . Community acquired pneumonia   . Pneumonia of both lungs due to infectious organism 03/02/2016  . 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 Anal lesion 06/07/2015  . Gas 01/18/2015  . Aortic stenosis 06/08/2013  . Unsteadiness 02/11/2013  . Ectopic atrial tachycardia ([place]) 01/12/2013  . Atrial tachycardia ([place]) 01/12/2013  . Atrial flutter ([place]) 05/06/2012  . Aortic valve sclerosis 05/06/2012  . Acute renal failure ([place]) 05/05/2012  . 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 Anemia 07/27/2010  .  Essential hypertension 11/15/2008  . COPD (chronic obstructive pulmonary disease) with chronic bronchitis ([place]) 11/15/2008  . GERD 11/15/2008  . IBS 11/15/2008  . Midsternal chest pain 11/15/2008    Past Surgical History:  Procedure Laterality Date  . ATRIAL FLUTTER ABLATION N/A 07/08/2012   Procedure: ATRIAL FLUTTER ABLATION;  Surgeon: Thompson Grayer, MD;  Location: [facility] CATH LAB;  Service: Cardiovascular;  Laterality: N/A;  . CARDIAC CATHETERIZATION  04/2012  . CATARACT EXTRACTION W/ INTRAOCULAR LENS IMPLANT Left   . COLONOSCOPY  08/29/10   diverticulosis, internal hemorrhoids  . ESOPHAGOGASTRODUODENOSCOPY  09/20/10   normal  . [place]  . LEFT HEART CATHETERIZATION WITH CORONARY ANGIOGRAM N/A 05/06/2012   Procedure: LEFT HEART CATHETERIZATION WITH CORONARY ANGIOGRAM;  Surgeon: Peter M Martinique, MD;  Location: [facility] CATH LAB;  Service: Cardiovascular;  Laterality: N/A;  . 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 PACEMAKER IMPLANT N/A 08/01/2017   Procedure: PACEMAKER IMPLANT;  Surgeon: Constance Haw, MD;  Location: [facility] CV LAB;  Service: Cardiovascular;  Laterality: N/A;  . TRIGGER FINGER RELEASE Right 1970's   "2 fingers"  . WART FULGURATION Left 09/15/2015   Procedure: EXCISIONal biospy of left peri anual and anual canal mass;  Surgeon: Greer Pickerel, MD;  Location: [facility] ORS;  Service: General;  Laterality: Left;        Home Medications    Prior to Admission medications   Medication Sig Start Date End Date Taking? Authorizing Provider  Aclidinium Bromide (TUDORZA PRESSAIR) 400 MCG/ACT AEPB Inhale 1 Act into the lungs 2 (two) times daily. 07/29/17   Plotnikov, Evie Lacks, MD  amLODipine (NORVASC) 2.5 MG tablet Take 1 tablet (2.5 mg total) by mouth daily. 08/14/17   Lelon Perla, MD  apixaban (ELIQUIS) 2.5 MG TABS tablet Take 1 tablet (2.5 mg total) by mouth 2 (two) times daily. Resume 7/28/19PM 08/07/17   Lelon Perla, MD  atorvastatin (LIPITOR) 40 MG tablet Take 1 tablet (40 mg total) by mouth daily at 6 PM. 08/21/17   Plotnikov, Evie Lacks, MD  cholecalciferol (VITAMIN D) 1000 units tablet Take 1 tablet (1,000 Units total) by mouth daily. 10/15/16   Plotnikov, Evie Lacks, MD  clidinium-chlordiazePOXIDE (LIBRAX) 5-2.5 MG capsule Take 1 capsule by mouth 2 (two) times daily. 08/21/17   Plotnikov,  Evie Lacks, MD  cyanocobalamin 1000 MCG tablet Take 1,000 mcg by mouth daily.     [provider]  famotidine (PEPCID) 20 MG tablet Take 1 tablet (20 mg total) by mouth at bedtime. 08/14/17   Tanda Rockers, MD  fluticasone (FLONASE) 50 MCG/ACT nasal spray USE 2 SPRAYS IN EACH NOSTRIL DAILY 08/15/17   Plotnikov, Evie Lacks, MD  gabapentin (NEURONTIN) 300 MG capsule Take 3 capsules at bedtime. 07/22/16   Plotnikov, Evie Lacks, MD  Glycopyrrolate [facility] MAGNAIR REFILL KIT) 25 MCG/ML SOLN Inhale 1 Dose into the lungs daily. 08/27/17   Plotnikov, Evie Lacks, MD  multivitamin-lutein (OCUVITE-LUTEIN) CAPS capsule Take 1 capsule by mouth daily.     [provider]  pantoprazole (PROTONIX) 40 MG tablet Take 1 tablet (40 mg total) by mouth daily. Take 30-60 min before first meal of the day 08/14/17   Tanda Rockers, MD  polyethylene glycol powder (GLYCOLAX/MIRALAX) powder Take 17 g by mouth 2 (two) times daily as needed. Patient taking differently: Take 17 g by mouth 2 (two) times daily as needed for mild constipation.  03/14/16   Wardell Honour, MD  senna-docusate Nance Pew)  8.6-50 MG tablet Take 1 tablet by mouth daily.    [provider]  tetrahydrozoline 0.05 % ophthalmic solution Place 1 drop into both eyes as needed (dry eyes).    [provider]    Family History Family History  Problem Relation Age of Onset  . Heart disease Brother   . Throat cancer Brother   . Other Mother        UNSURE  . Other Father        UNSURE  . Emphysema Sister   . Emphysema Sister   . Other Brother        UNSURE  . Diabetes Unknown     Social History Social History   Tobacco Use  . Smoking status: Former Smoker    Packs/day: 0.50    Years: 20.00    Pack years: 10.00    Types: Cigarettes, Pipe, Cigars    Last attempt to quit: 12/08/1996    Years since quitting: 20.7  . Smokeless tobacco: Never Used  . Tobacco comment:    Substance Use Topics  . Alcohol use: No     Alcohol/week: 0.0 standard drinks    Frequency: Never    Comment: last since 1998  . Drug use: Never     Allergies   Dyazide [hydrochlorothiazide w-triamterene] and Lisinopril   Review of Systems Review of Systems  Constitutional: Negative for chills and fever.  HENT: Negative for ear pain, nosebleeds and sore throat.   Eyes: Negative for pain and visual disturbance.  Respiratory: Negative for cough and shortness of breath.   Cardiovascular: Negative for chest pain and palpitations.  Gastrointestinal: Positive for blood in stool and hematochezia. Negative for abdominal distention, abdominal pain, anal bleeding, constipation, diarrhea, hematemesis, nausea, rectal pain and vomiting.  Genitourinary: Negative for dysuria and hematuria.  Musculoskeletal: Negative for arthralgias and back pain.  Skin: Negative for color change and rash.  Neurological: Negative for dizziness, seizures, loss of consciousness, syncope and light-headedness.  All other systems reviewed and are negative.    Physical Exam Updated Vital Signs BP 113/69   Pulse 80   Temp 97.7 F (36.5 C) (Oral)   Resp 17   SpO2 100%   Physical Exam  Constitutional: He is oriented to person, place, and time. He appears well-developed and well-nourished.  HENT:  Head: Normocephalic and atraumatic.  Eyes: Pupils are equal, round, and reactive to light. Conjunctivae and EOM are normal.  Neck: Normal range of motion. Neck supple.  Cardiovascular: Normal rate, regular rhythm, normal heart sounds and intact distal pulses.  No murmur heard. Pulmonary/Chest: Effort normal and breath sounds normal. No respiratory distress.  Abdominal: Soft. Bowel sounds are normal. He exhibits no distension. There is no tenderness.  Genitourinary: Rectum normal. Rectal exam shows guaiac negative stool.  Genitourinary Comments: No obvious hemorrhoids, some skin breakdown around the rectum but no signs of fissures  Musculoskeletal: Normal range  of motion. He exhibits no edema.  Neurological: He is alert and oriented to person, place, and time.  Skin: Skin is warm and dry. Capillary refill takes less than 2 seconds.  Psychiatric: He has a normal mood and affect.  Nursing note and vitals reviewed.    ED Treatments / Results  Labs (all labs ordered are listed, but only abnormal results are displayed) Labs Reviewed  COMPREHENSIVE METABOLIC PANEL - Abnormal; Notable for the following components:      Result Value   CO2 18 (*)    Glucose, Bld 105 (*)  Creatinine, Ser 1.60 (*)    GFR calc non Af Amer 38 (*)    GFR calc Af Amer 44 (*)    All other components within normal limits  CBC - Abnormal; Notable for the following components:   RDW 15.9 (*)    All other components within normal limits  POC OCCULT BLOOD, ED  TYPE AND SCREEN    EKG None  Radiology No results found.  Procedures Procedures (including critical care time)  Medications Ordered in ED Medications - No data to display   Initial Impression / Assessment and Plan / ED Course  I have reviewed the triage vital signs and the nursing notes.  Pertinent labs & imaging results that were available during my care of the patient were reviewed by me and considered in my medical decision making (see chart for details).     Chad [name] is an 84 year old male with history of atrial flutter, arthritis, COPD, diverticulosis who presents to the ED with blood in stool.  Patient with normal vitals.  No fever.  Patient noticed some blood on the toilet paper after wiping today.  Denies any abdominal pain, rectal pain.  He denies any nausea, vomiting, diarrhea.  Patient denies any chest pain, shortness of breath.  Patient overall well-appearing.  No significant anemia, electrolyte abnormality.  No leukocytosis.  Creatinine at baseline.  Patient with no melena or hematochezia on exam.  Occult stool test was negative.  Hemoglobin is 13.4.  No concern for GI bleed.  No  obvious hemorrhoids on rectal exam.  No fissures.  There is some skin irritation around the rectum that is likely the cause of the blood on toilet paper today.  Patient was given reassurance and discharged from ED in good condition.  Recommend follow-up with primary care doctor and discharged from ED in good condition.  No sign of acute GI bleed.  Has had polyps in the past and may require follow-up with GI.  This chart was dictated using voice recognition software.  Despite best efforts to proofread,  errors can occur which can change the documentation meaning.   Final Clinical Impressions(s) / ED Diagnoses   Final diagnoses:  Blood in stool    ED Discharge Orders    None       Lennice Sites, DO 09/07/17 2347

## 2017-09-10 DIAGNOSIS — Z48812 Encounter for surgical aftercare following surgery on the circulatory system: Secondary | ICD-10-CM | POA: Diagnosis not present

## 2017-09-10 DIAGNOSIS — N184 Chronic kidney disease, stage 4 (severe): Secondary | ICD-10-CM | POA: Diagnosis not present

## 2017-09-10 DIAGNOSIS — J189 Pneumonia, unspecified organism: Secondary | ICD-10-CM | POA: Diagnosis not present

## 2017-09-10 DIAGNOSIS — I130 Hypertensive heart and chronic kidney disease with heart failure and stage 1 through stage 4 chronic kidney disease, or unspecified chronic kidney disease: Secondary | ICD-10-CM | POA: Diagnosis not present

## 2017-09-10 DIAGNOSIS — I5032 Chronic diastolic (congestive) heart failure: Secondary | ICD-10-CM | POA: Diagnosis not present

## 2017-09-10 DIAGNOSIS — J440 Chronic obstructive pulmonary disease with acute lower respiratory infection: Secondary | ICD-10-CM | POA: Diagnosis not present

## 2017-09-13 ENCOUNTER — Encounter: Payer: Self-pay | Admitting: Internal Medicine

## 2017-09-13 ENCOUNTER — Ambulatory Visit (INDEPENDENT_AMBULATORY_CARE_PROVIDER_SITE_OTHER): Payer: Medicare Other | Admitting: Internal Medicine

## 2017-09-13 DIAGNOSIS — I639 Cerebral infarction, unspecified: Secondary | ICD-10-CM | POA: Diagnosis not present

## 2017-09-13 DIAGNOSIS — R634 Abnormal weight loss: Secondary | ICD-10-CM | POA: Insufficient documentation

## 2017-09-13 DIAGNOSIS — K625 Hemorrhage of anus and rectum: Secondary | ICD-10-CM | POA: Diagnosis not present

## 2017-09-13 NOTE — Assessment & Plan Note (Signed)
S/p ER visit: "presents to the ED with blood in stool.  Patient with normal vitals.  No fever.  Patient noticed some blood on the toilet paper after wiping today.  Denies any abdominal pain, rectal pain.  He denies any nausea, vomiting, diarrhea.  Patient denies any chest pain, shortness of breath.  Patient overall well-appearing.  No significant anemia, electrolyte abnormality.  No leukocytosis.  Creatinine at baseline.  Patient with no melena or hematochezia on exam.  Occult stool test was negative.  Hemoglobin is 13.4.  No concern for GI bleed.  No obvious hemorrhoids on rectal exam.  No fissures.  There is some skin irritation around the rectum that is likely the cause of the blood on toilet paper today.  Patient was given reassurance and discharged from ED in good condition.  Recommend follow-up with primary care doctor and discharged from ED in good condition.  No sign of acute GI bleed.  Has had polyps in the past and may require follow-up with GI."

## 2017-09-13 NOTE — Patient Instructions (Signed)
Please use your dentures

## 2017-09-13 NOTE — Assessment & Plan Note (Addendum)
Wt Readings from Last 3 Encounters:  09/13/17 173 lb (78.5 kg)  09/05/17 172 lb 9.6 oz (78.3 kg)  08/27/17 177 lb (80.3 kg)   Related to not using dentures - start using them

## 2017-09-13 NOTE — Progress Notes (Signed)
Subjective:  Patient ID: Chad [name], male    DOB: Oct 19, 1931  Age: 84 y.o. MRN: [id number]  CC: No chief complaint on file.   HPI PANTELIS ELGERSMA presents for a post ER visit for a blood spot on a toilet paper. Colonosc 2012 Per hx:  Pt "presents to the ED with blood in stool.  Patient with normal vitals.  No fever.  Patient noticed some blood on the toilet paper after wiping today.  Denies any abdominal pain, rectal pain.  He denies any nausea, vomiting, diarrhea.  Patient denies any chest pain, shortness of breath.  Patient overall well-appearing.  No significant anemia, electrolyte abnormality.  No leukocytosis.  Creatinine at baseline.  Patient with no melena or hematochezia on exam.  Occult stool test was negative.  Hemoglobin is 13.4.  No concern for GI bleed.  No obvious hemorrhoids on rectal exam.  No fissures.  There is some skin irritation around the rectum that is likely the cause of the blood on toilet paper today.  Patient was given reassurance and discharged from ED in good condition.  Recommend follow-up with primary care doctor and discharged from ED in good condition.  No sign of acute GI bleed.  Has had polyps in the past and may require follow-up with GI."  C/o wt loss - not using dentures    Outpatient Medications Prior to Visit  Medication Sig Dispense Refill  . Aclidinium Bromide (TUDORZA PRESSAIR) 400 MCG/ACT AEPB Inhale 1 Act into the lungs 2 (two) times daily. 3 each 3  . amLODipine (NORVASC) 2.5 MG tablet Take 1 tablet (2.5 mg total) by mouth daily. 90 tablet 3  . apixaban (ELIQUIS) 2.5 MG TABS tablet Take 1 tablet (2.5 mg total) by mouth 2 (two) times daily. Resume 7/28/19PM 180 tablet 1  . atorvastatin (LIPITOR) 40 MG tablet Take 1 tablet (40 mg total) by mouth daily at 6 PM. 90 tablet 1  . cholecalciferol (VITAMIN D) 1000 units tablet Take 1 tablet (1,000 Units total) by mouth daily. 100 tablet 3  . clidinium-chlordiazePOXIDE (LIBRAX) 5-2.5 MG capsule Take  1 capsule by mouth 2 (two) times daily. 180 capsule 1  . cyanocobalamin 1000 MCG tablet Take 1,000 mcg by mouth daily.     . famotidine (PEPCID) 20 MG tablet Take 1 tablet (20 mg total) by mouth at bedtime. 90 tablet 1  . fluticasone (FLONASE) 50 MCG/ACT nasal spray USE 2 SPRAYS IN EACH NOSTRIL DAILY 48 g 3  . gabapentin (NEURONTIN) 300 MG capsule Take 3 capsules at bedtime. 270 capsule 3  . Glycopyrrolate (LONHALA MAGNAIR REFILL KIT) 25 MCG/ML SOLN Inhale 1 Dose into the lungs daily. 90 mL 3  . multivitamin-lutein (OCUVITE-LUTEIN) CAPS capsule Take 1 capsule by mouth daily.     . pantoprazole (PROTONIX) 40 MG tablet Take 1 tablet (40 mg total) by mouth daily. Take 30-60 min before first meal of the day 90 tablet 1  . polyethylene glycol powder (GLYCOLAX/MIRALAX) powder Take 17 g by mouth 2 (two) times daily as needed. (Patient taking differently: Take 17 g by mouth 2 (two) times daily as needed for mild constipation. ) 3350 g 1  . senna-docusate (SENOKOT-S) 8.6-50 MG tablet Take 1 tablet by mouth daily.    [name] tetrahydrozoline 0.05 % ophthalmic solution Place 1 drop into both eyes as needed (dry eyes).     No facility-administered medications prior to visit.     ROS: Review of Systems  Constitutional: Positive for unexpected weight change.  Gastrointestinal: Positive for blood in stool.    Objective:  BP (!) 142/78 (BP Location: Right Arm, Patient Position: Sitting, Cuff Size: Normal)   Pulse 88   Temp 97.8 F (36.6 C) (Oral)   Ht _0  (1.854 m)   Wt 173 lb (78.5 kg)   SpO2 98%   BMI 22.82 kg/m   BP Readings from Last 3 Encounters:  09/13/17 (!) 142/78  09/07/17 113/69  09/05/17 (!) 147/83    Wt Readings from Last 3 Encounters:  09/13/17 173 lb (78.5 kg)  09/05/17 172 lb 9.6 oz (78.3 kg)  08/27/17 177 lb (80.3 kg)    Physical Exam  Constitutional: He is oriented to person, place, and time. He appears well-developed and well-nourished. No distress.  HENT:  Head:  Normocephalic and atraumatic.  Right Ear: External ear normal.  Left Ear: External ear normal.  Nose: Nose normal.  Mouth/Throat: Oropharynx is clear and moist. No oropharyngeal exudate.  Eyes: Pupils are equal, round, and reactive to light. Conjunctivae and EOM are normal. Right eye exhibits no discharge. Left eye exhibits no discharge. No scleral icterus.  Neck: Normal range of motion. Neck supple. No JVD present. No tracheal deviation present. No thyromegaly present.  Cardiovascular: Normal rate, regular rhythm, normal heart sounds and intact distal pulses. Exam reveals no gallop and no friction rub.  No murmur heard. Pulmonary/Chest: Effort normal and breath sounds normal. No stridor. No respiratory distress. He has no wheezes. He has no rales. He exhibits no tenderness.  Abdominal: Soft. Bowel sounds are normal. He exhibits no distension and no mass. There is no tenderness. There is no rebound and no guarding.  Genitourinary: Rectum normal, prostate normal and penis normal. Rectal exam shows guaiac negative stool. No penile tenderness.  Musculoskeletal: Normal range of motion. He exhibits no edema or tenderness.  Lymphadenopathy:    He has no cervical adenopathy.  Neurological: He is alert and oriented to person, place, and time. He has normal reflexes. He displays normal reflexes. No cranial nerve deficit. He exhibits normal muscle tone. Coordination abnormal.  Skin: Skin is warm and dry. No rash noted. He is not diaphoretic. No erythema. No pallor.  Psychiatric: He has a normal mood and affect. His behavior is normal. Judgment and thought content normal.  thin Cane  Lab Results  Component Value Date   WBC 8.4 09/07/2017   HGB 13.4 09/07/2017   HCT 42.9 09/07/2017   PLT 181 09/07/2017   GLUCOSE 105 (H) 09/07/2017   CHOL 176 06/04/2017   TRIG 113 06/04/2017   HDL 31 (L) 06/04/2017   LDLCALC 122 (H) 06/04/2017   ALT 18 09/07/2017   AST 31 09/07/2017   NA 135 09/07/2017   K 4.1  09/07/2017   CL 102 09/07/2017   CREATININE 1.60 (H) 09/07/2017   BUN 12 09/07/2017   CO2 18 (L) 09/07/2017   TSH 5.393 (H) 06/11/2017   PSA 1.13 04/24/2016   INR 1.06 06/03/2017   HGBA1C 5.6 06/04/2017    No results found.  Assessment & Plan:   There are no diagnoses linked to this encounter.   No orders of the defined types were placed in this encounter.    Follow-up: No follow-ups on file.  Walker Kehr, MD

## 2017-09-17 ENCOUNTER — Telehealth: Payer: Self-pay | Admitting: Internal Medicine

## 2017-09-17 DIAGNOSIS — I5032 Chronic diastolic (congestive) heart failure: Secondary | ICD-10-CM | POA: Diagnosis not present

## 2017-09-17 DIAGNOSIS — J440 Chronic obstructive pulmonary disease with acute lower respiratory infection: Secondary | ICD-10-CM | POA: Diagnosis not present

## 2017-09-17 DIAGNOSIS — I130 Hypertensive heart and chronic kidney disease with heart failure and stage 1 through stage 4 chronic kidney disease, or unspecified chronic kidney disease: Secondary | ICD-10-CM | POA: Diagnosis not present

## 2017-09-17 DIAGNOSIS — J189 Pneumonia, unspecified organism: Secondary | ICD-10-CM | POA: Diagnosis not present

## 2017-09-17 DIAGNOSIS — Z48812 Encounter for surgical aftercare following surgery on the circulatory system: Secondary | ICD-10-CM | POA: Diagnosis not present

## 2017-09-17 DIAGNOSIS — N184 Chronic kidney disease, stage 4 (severe): Secondary | ICD-10-CM | POA: Diagnosis not present

## 2017-09-17 DIAGNOSIS — K5900 Constipation, unspecified: Secondary | ICD-10-CM

## 2017-09-17 NOTE — Telephone Encounter (Signed)
Please advise 

## 2017-09-17 NOTE — Telephone Encounter (Signed)
Copied from [place] [id number]. Topic: General - Other >> Sep 17, 2017 10:23 AM Lennox Solders wrote: Reason for CRM: pt saw dr plotnikov on 09-13-17. Pt is calling to requesting colonoscopy or ct abd due to stool is black and he is having abd pains

## 2017-09-18 NOTE — Progress Notes (Signed)
I personally reviewed the Note/Plan and I agree.  

## 2017-09-18 NOTE — Telephone Encounter (Signed)
Come to our X ray tomorrow for abd X ray Thx

## 2017-09-19 ENCOUNTER — Ambulatory Visit (INDEPENDENT_AMBULATORY_CARE_PROVIDER_SITE_OTHER)
Admission: RE | Admit: 2017-09-19 | Discharge: 2017-09-19 | Disposition: A | Discharge status: Home / Self Care | Payer: Medicare Other | Source: Ambulatory Visit | Attending: Internal Medicine | Admitting: Internal Medicine

## 2017-09-19 DIAGNOSIS — R109 Unspecified abdominal pain: Secondary | ICD-10-CM | POA: Diagnosis not present

## 2017-09-19 DIAGNOSIS — K5900 Constipation, unspecified: Secondary | ICD-10-CM | POA: Diagnosis not present

## 2017-09-19 NOTE — Telephone Encounter (Signed)
Spoke with patient and info given. He will come by office today or tomorrow for x-ray.

## 2017-09-20 ENCOUNTER — Telehealth: Payer: Self-pay

## 2017-09-20 DIAGNOSIS — R140 Abdominal distension (gaseous): Secondary | ICD-10-CM

## 2017-09-20 NOTE — Telephone Encounter (Signed)
Pt notified and would like to know what the next step is.  Please advise  Copied from [place] [phone]. Topic: Quick Communication - Other Results >> Sep 20, 2017  9:57 AM Lennox Solders wrote: Pt is calling back to get abd xray results. Pt unable to retrieve message on voicemail. No crm for [facility] nurse to gave results

## 2017-09-22 NOTE — Telephone Encounter (Signed)
I can refer him to see a GI specialist. Is it OK? Thx

## 2017-09-23 NOTE — Telephone Encounter (Signed)
Pt agreed to referral

## 2017-09-25 NOTE — Telephone Encounter (Signed)
Done. Thx.

## 2017-09-30 ENCOUNTER — Other Ambulatory Visit: Payer: Self-pay | Admitting: *Deleted

## 2017-09-30 NOTE — Patient Outreach (Signed)
[facility]) Care Management  09/30/2017  Chad [name] [birth year] [id number]   Call placed to member & ex-wife to follow up on current health condition and [facility] with [facility].  No answer, HIPAA compliant voice message left.  Will follow up within the next 4 business days.  Valente David, [place], MSN [place] [phone]

## 2017-10-02 DIAGNOSIS — J440 Chronic obstructive pulmonary disease with acute lower respiratory infection: Secondary | ICD-10-CM | POA: Diagnosis not present

## 2017-10-02 DIAGNOSIS — Z48812 Encounter for surgical aftercare following surgery on the circulatory system: Secondary | ICD-10-CM | POA: Diagnosis not present

## 2017-10-02 DIAGNOSIS — N184 Chronic kidney disease, stage 4 (severe): Secondary | ICD-10-CM | POA: Diagnosis not present

## 2017-10-02 DIAGNOSIS — I130 Hypertensive heart and chronic kidney disease with heart failure and stage 1 through stage 4 chronic kidney disease, or unspecified chronic kidney disease: Secondary | ICD-10-CM | POA: Diagnosis not present

## 2017-10-02 DIAGNOSIS — J189 Pneumonia, unspecified organism: Secondary | ICD-10-CM | POA: Diagnosis not present

## 2017-10-02 DIAGNOSIS — I5032 Chronic diastolic (congestive) heart failure: Secondary | ICD-10-CM | POA: Diagnosis not present

## 2017-10-04 ENCOUNTER — Other Ambulatory Visit: Payer: Self-pay | Admitting: *Deleted

## 2017-10-04 NOTE — Patient Outreach (Signed)
[facility]) Care Management  10/04/2017  Chad [name] 27-Apr-1931 [id number]   Call placed to member to follow up on current health status and establishing care with [facility].  He report that he has established care with primary MD at [facility], but also still has [name] as civilian PCP.  He denies any chest pain or discomfort at this time.  Denies any further nursing needs for community care [employer].  Discussed transition to health coach for COPD and CHF management, he agrees.  Will place referral to health coach.  Valente David, [place], MSN [place] [phone]

## 2017-10-08 ENCOUNTER — Encounter: Payer: Self-pay | Admitting: *Deleted

## 2017-10-10 ENCOUNTER — Ambulatory Visit: Payer: Medicare Other | Admitting: Internal Medicine

## 2017-10-15 ENCOUNTER — Ambulatory Visit (INDEPENDENT_AMBULATORY_CARE_PROVIDER_SITE_OTHER): Payer: Medicare Other | Admitting: Internal Medicine

## 2017-10-15 ENCOUNTER — Encounter: Payer: Self-pay | Admitting: Internal Medicine

## 2017-10-15 VITALS — BP 132/78 | HR 77 | Temp 97.9°F | Ht 73.0 in | Wt 174.0 lb

## 2017-10-15 DIAGNOSIS — J449 Chronic obstructive pulmonary disease, unspecified: Secondary | ICD-10-CM | POA: Diagnosis not present

## 2017-10-15 DIAGNOSIS — R0609 Other forms of dyspnea: Secondary | ICD-10-CM

## 2017-10-15 DIAGNOSIS — N184 Chronic kidney disease, stage 4 (severe): Secondary | ICD-10-CM | POA: Diagnosis not present

## 2017-10-15 DIAGNOSIS — I639 Cerebral infarction, unspecified: Secondary | ICD-10-CM | POA: Diagnosis not present

## 2017-10-15 DIAGNOSIS — H548 Legal blindness, as defined in USA: Secondary | ICD-10-CM | POA: Diagnosis not present

## 2017-10-15 MED ORDER — IPRATROPIUM BROMIDE 0.02 % IN SOLN: 0.5000 mg | Freq: Three times a day (TID) | RESPIRATORY_TRACT | 12 refills | Status: DC

## 2017-10-15 NOTE — Progress Notes (Signed)
Subjective:  Patient ID: Chad [name], male    DOB: 20-Oct-1931  Age: 84 y.o. MRN: [id number]  CC: No chief complaint on file.   HPI [name] presents for depression - loosing eyesight in the past month F/u abd pain - ok F/u HTN, COPD  Outpatient Medications Prior to Visit  Medication Sig Dispense Refill  . Aclidinium Bromide (TUDORZA PRESSAIR) 400 MCG/ACT AEPB Inhale 1 Act into the lungs 2 (two) times daily. 3 each 3  . amLODipine (NORVASC) 2.5 MG tablet Take 1 tablet (2.5 mg total) by mouth daily. 90 tablet 3  . apixaban (ELIQUIS) 2.5 MG TABS tablet Take 1 tablet (2.5 mg total) by mouth 2 (two) times daily. Resume 7/28/19PM 180 tablet 1  . atorvastatin (LIPITOR) 40 MG tablet Take 1 tablet (40 mg total) by mouth daily at 6 PM. 90 tablet 1  . cholecalciferol (VITAMIN D) 1000 units tablet Take 1 tablet (1,000 Units total) by mouth daily. 100 tablet 3  . clidinium-chlordiazePOXIDE (LIBRAX) 5-2.5 MG capsule Take 1 capsule by mouth 2 (two) times daily. 180 capsule 1  . cyanocobalamin 1000 MCG tablet Take 1,000 mcg by mouth daily.     [name] escitalopram (LEXAPRO) 10 MG tablet Take 10 mg by mouth daily.    . famotidine (PEPCID) 20 MG tablet Take 1 tablet (20 mg total) by mouth at bedtime. 90 tablet 1  . fluticasone (FLONASE) 50 MCG/ACT nasal spray USE 2 SPRAYS IN EACH NOSTRIL DAILY 48 g 3  . gabapentin (NEURONTIN) 300 MG capsule Take 3 capsules at bedtime. 270 capsule 3  . Glycopyrrolate (LONHALA MAGNAIR REFILL KIT) 25 MCG/ML SOLN Inhale 1 Dose into the lungs daily. 90 mL 3  . multivitamin-lutein (OCUVITE-LUTEIN) CAPS capsule Take 1 capsule by mouth daily.     . pantoprazole (PROTONIX) 40 MG tablet Take 1 tablet (40 mg total) by mouth daily. Take 30-60 min before first meal of the day 90 tablet 1  . polyethylene glycol powder (GLYCOLAX/MIRALAX) powder Take 17 g by mouth 2 (two) times daily as needed. (Patient taking differently: Take 17 g by mouth 2 (two) times daily as needed for mild  constipation. ) 3350 g 1  . senna-docusate (SENOKOT-S) 8.6-50 MG tablet Take 1 tablet by mouth daily.    [name] tetrahydrozoline 0.05 % ophthalmic solution Place 1 drop into both eyes as needed (dry eyes).     No facility-administered medications prior to visit.     ROS: Review of Systems  Constitutional: Negative for appetite change, fatigue and unexpected weight change.  HENT: Negative for congestion, nosebleeds, sneezing, sore throat and trouble swallowing.   Eyes: Positive for visual disturbance. Negative for itching.  Respiratory: Positive for shortness of breath. Negative for cough.   Cardiovascular: Negative for chest pain, palpitations and leg swelling.  Gastrointestinal: Negative for abdominal distention, blood in stool, diarrhea and nausea.  Genitourinary: Negative for frequency and hematuria.  Musculoskeletal: Positive for gait problem. Negative for back pain, joint swelling and neck pain.  Skin: Negative for rash.  Neurological: Negative for dizziness, tremors, speech difficulty and weakness.  Psychiatric/Behavioral: Negative for agitation, dysphoric mood and sleep disturbance. The patient is not nervous/anxious.     Objective:  BP 132/78 (BP Location: Left Arm, Patient Position: Sitting, Cuff Size: Normal)   Pulse 77   Temp 97.9 F (36.6 C) (Oral)   Ht 6' 1"  (1.854 m)   Wt 174 lb (78.9 kg)   SpO2 97%   BMI 22.96 kg/m   BP Readings  from Last 3 Encounters:  10/15/17 132/78  09/13/17 (!) 142/78  09/07/17 113/69    Wt Readings from Last 3 Encounters:  10/15/17 174 lb (78.9 kg)  09/13/17 173 lb (78.5 kg)  09/05/17 172 lb 9.6 oz (78.3 kg)    Physical Exam  Constitutional: He is oriented to person, place, and time. He appears well-developed. No distress.  NAD  HENT:  Mouth/Throat: Oropharynx is clear and moist.  Eyes: Pupils are equal, round, and reactive to light. Conjunctivae are normal.  Neck: Normal range of motion. No JVD present. No thyromegaly present.    Cardiovascular: Normal rate, regular rhythm, normal heart sounds and intact distal pulses. Exam reveals no gallop and no friction rub.  No murmur heard. Pulmonary/Chest: Effort normal and breath sounds normal. No respiratory distress. He has no wheezes. He has no rales. He exhibits no tenderness.  Abdominal: Soft. Bowel sounds are normal. He exhibits no distension and no mass. There is no tenderness. There is no rebound and no guarding.  Musculoskeletal: Normal range of motion. He exhibits no edema or tenderness.  Lymphadenopathy:    He has no cervical adenopathy.  Neurological: He is alert and oriented to person, place, and time. He has normal reflexes. No cranial nerve deficit. He exhibits normal muscle tone. He displays a negative Romberg sign. Coordination and gait normal.  Skin: Skin is warm and dry. No rash noted.  Psychiatric: He has a normal mood and affect. His behavior is normal. Judgment and thought content normal.  cane Poor vision  Lab Results  Component Value Date   WBC 8.4 09/07/2017   HGB 13.4 09/07/2017   HCT 42.9 09/07/2017   PLT 181 09/07/2017   GLUCOSE 105 (H) 09/07/2017   CHOL 176 06/04/2017   TRIG 113 06/04/2017   HDL 31 (L) 06/04/2017   LDLCALC 122 (H) 06/04/2017   ALT 18 09/07/2017   AST 31 09/07/2017   NA 135 09/07/2017   K 4.1 09/07/2017   CL 102 09/07/2017   CREATININE 1.60 (H) 09/07/2017   BUN 12 09/07/2017   CO2 18 (L) 09/07/2017   TSH 5.393 (H) 06/11/2017   PSA 1.13 04/24/2016   INR 1.06 06/03/2017   HGBA1C 5.6 06/04/2017    Dg Abd 2 Views  Result Date: 09/19/2017 CLINICAL DATA:  Generalized abdominal pain, chronic constipation, history irritable bowel syndrome, GERD EXAM: ABDOMEN - 2 VIEW COMPARISON:  CT abdomen pelvis 11/22/2015 FINDINGS: Tip of pacemaker lead projects over RIGHT ventricle. Normal bowel gas pattern. No bowel dilatation or bowel wall thickening. Diaphragms incompletely visualized, able to assess for free air. Bones diffusely  demineralized. Scattered atherosclerotic calcifications. IMPRESSION: Nonobstructive bowel gas pattern. Electronically Signed   By: Lavonia Dana M.D.   On: 09/19/2017 21:20    Assessment & Plan:   There are no diagnoses linked to this encounter.   No orders of the defined types were placed in this encounter.    Follow-up: No follow-ups on file.  Walker Kehr, MD

## 2017-10-15 NOTE — Assessment & Plan Note (Addendum)
HHN atrovent 10/19 [facility]

## 2017-10-15 NOTE — Assessment & Plan Note (Signed)
Discussed ?OT

## 2017-10-15 NOTE — Assessment & Plan Note (Signed)
Monitoring labs 

## 2017-10-15 NOTE — Assessment & Plan Note (Signed)
Atrovent HHN

## 2017-10-16 ENCOUNTER — Other Ambulatory Visit: Payer: Self-pay

## 2017-10-16 MED ORDER — IPRATROPIUM BROMIDE 0.02 % IN SOLN: 0.5000 mg | Freq: Three times a day (TID) | RESPIRATORY_TRACT | 12 refills | Status: DC

## 2017-10-17 DIAGNOSIS — H524 Presbyopia: Secondary | ICD-10-CM | POA: Diagnosis not present

## 2017-10-17 DIAGNOSIS — H353231 Exudative age-related macular degeneration, bilateral, with active choroidal neovascularization: Secondary | ICD-10-CM | POA: Diagnosis not present

## 2017-10-17 DIAGNOSIS — H2511 Age-related nuclear cataract, right eye: Secondary | ICD-10-CM | POA: Diagnosis not present

## 2017-10-22 ENCOUNTER — Emergency Department (HOSPITAL_COMMUNITY)
Admission: EM | Admit: 2017-10-22 | Discharge: 2017-10-22 | Disposition: A | Discharge status: Home / Self Care | Payer: Medicare Other | Attending: Emergency Medicine | Admitting: Emergency Medicine

## 2017-10-22 ENCOUNTER — Telehealth: Payer: Self-pay | Admitting: Internal Medicine

## 2017-10-22 ENCOUNTER — Encounter (HOSPITAL_COMMUNITY): Payer: Self-pay | Admitting: *Deleted

## 2017-10-22 ENCOUNTER — Emergency Department (HOSPITAL_COMMUNITY): Payer: Medicare Other

## 2017-10-22 ENCOUNTER — Other Ambulatory Visit: Payer: Self-pay

## 2017-10-22 DIAGNOSIS — K4090 Unilateral inguinal hernia, without obstruction or gangrene, not specified as recurrent: Secondary | ICD-10-CM | POA: Diagnosis not present

## 2017-10-22 DIAGNOSIS — R110 Nausea: Secondary | ICD-10-CM | POA: Diagnosis not present

## 2017-10-22 DIAGNOSIS — I5032 Chronic diastolic (congestive) heart failure: Secondary | ICD-10-CM | POA: Diagnosis not present

## 2017-10-22 DIAGNOSIS — J449 Chronic obstructive pulmonary disease, unspecified: Secondary | ICD-10-CM | POA: Diagnosis not present

## 2017-10-22 DIAGNOSIS — N184 Chronic kidney disease, stage 4 (severe): Secondary | ICD-10-CM | POA: Insufficient documentation

## 2017-10-22 DIAGNOSIS — Z87891 Personal history of nicotine dependence: Secondary | ICD-10-CM | POA: Diagnosis not present

## 2017-10-22 DIAGNOSIS — Z85810 Personal history of malignant neoplasm of tongue: Secondary | ICD-10-CM | POA: Diagnosis not present

## 2017-10-22 DIAGNOSIS — Z79899 Other long term (current) drug therapy: Secondary | ICD-10-CM | POA: Insufficient documentation

## 2017-10-22 DIAGNOSIS — Z950 Presence of cardiac pacemaker: Secondary | ICD-10-CM | POA: Insufficient documentation

## 2017-10-22 DIAGNOSIS — R1033 Periumbilical pain: Secondary | ICD-10-CM | POA: Diagnosis present

## 2017-10-22 DIAGNOSIS — I130 Hypertensive heart and chronic kidney disease with heart failure and stage 1 through stage 4 chronic kidney disease, or unspecified chronic kidney disease: Secondary | ICD-10-CM | POA: Diagnosis not present

## 2017-10-22 DIAGNOSIS — Z7901 Long term (current) use of anticoagulants: Secondary | ICD-10-CM | POA: Insufficient documentation

## 2017-10-22 DIAGNOSIS — K429 Umbilical hernia without obstruction or gangrene: Secondary | ICD-10-CM | POA: Insufficient documentation

## 2017-10-22 DIAGNOSIS — Z85818 Personal history of malignant neoplasm of other sites of lip, oral cavity, and pharynx: Secondary | ICD-10-CM | POA: Insufficient documentation

## 2017-10-22 DIAGNOSIS — I10 Essential (primary) hypertension: Secondary | ICD-10-CM | POA: Diagnosis not present

## 2017-10-22 DIAGNOSIS — R109 Unspecified abdominal pain: Secondary | ICD-10-CM | POA: Diagnosis not present

## 2017-10-22 LAB — CBC WITH DIFFERENTIAL/PLATELET
Abs Immature Granulocytes: 0.04 10*3/uL (ref 0.00–0.07)
BASOS ABS: 0.1 10*3/uL (ref 0.0–0.1)
Basophils Relative: 1 %
EOS PCT: 4 %
Eosinophils Absolute: 0.3 10*3/uL (ref 0.0–0.5)
HCT: 39.7 % (ref 39.0–52.0)
HEMOGLOBIN: 12.4 g/dL — AB (ref 13.0–17.0)
Immature Granulocytes: 0 %
LYMPHS PCT: 17 %
Lymphs Abs: 1.6 10*3/uL (ref 0.7–4.0)
MCH: 26.7 pg (ref 26.0–34.0)
MCHC: 31.2 g/dL (ref 30.0–36.0)
MCV: 85.6 fL (ref 80.0–100.0)
Monocytes Absolute: 1 10*3/uL (ref 0.1–1.0)
Monocytes Relative: 11 %
NRBC: 0 % (ref 0.0–0.2)
Neutro Abs: 6.4 10*3/uL (ref 1.7–7.7)
Neutrophils Relative %: 67 %
Platelets: 235 10*3/uL (ref 150–400)
RBC: 4.64 MIL/uL (ref 4.22–5.81)
RDW: 15.4 % (ref 11.5–15.5)
WBC: 9.4 10*3/uL (ref 4.0–10.5)

## 2017-10-22 LAB — LIPASE, BLOOD: LIPASE: 28 U/L (ref 11–51)

## 2017-10-22 LAB — PROTIME-INR
INR: 1.39
Prothrombin Time: 16.9 seconds — ABNORMAL HIGH (ref 11.4–15.2)

## 2017-10-22 LAB — COMPREHENSIVE METABOLIC PANEL
ALK PHOS: 74 U/L (ref 38–126)
ALT: 19 U/L (ref 0–44)
ANION GAP: 12 (ref 5–15)
AST: 39 U/L (ref 15–41)
Albumin: 3.2 g/dL — ABNORMAL LOW (ref 3.5–5.0)
BILIRUBIN TOTAL: 0.8 mg/dL (ref 0.3–1.2)
BUN: 9 mg/dL (ref 8–23)
CALCIUM: 9 mg/dL (ref 8.9–10.3)
CO2: 23 mmol/L (ref 22–32)
Chloride: 98 mmol/L (ref 98–111)
Creatinine, Ser: 1.87 mg/dL — ABNORMAL HIGH (ref 0.61–1.24)
GFR calc Af Amer: 36 mL/min — ABNORMAL LOW (ref >60)
GFR, EST NON AFRICAN AMERICAN: 31 mL/min — AB (ref >60)
GLUCOSE: 101 mg/dL — AB (ref 70–99)
POTASSIUM: 4 mmol/L (ref 3.5–5.1)
Sodium: 133 mmol/L — ABNORMAL LOW (ref 135–145)
TOTAL PROTEIN: 7.2 g/dL (ref 6.5–8.1)

## 2017-10-22 LAB — URINALYSIS, ROUTINE W REFLEX MICROSCOPIC
BILIRUBIN URINE: NEGATIVE
Glucose, UA: NEGATIVE mg/dL
Hgb urine dipstick: NEGATIVE
KETONES UR: NEGATIVE mg/dL
Leukocytes, UA: NEGATIVE
Nitrite: NEGATIVE
Protein, ur: NEGATIVE mg/dL
SPECIFIC GRAVITY, URINE: 1.005 (ref 1.005–1.030)
pH: 7 (ref 5.0–8.0)

## 2017-10-22 MED ORDER — SODIUM CHLORIDE 0.9 % IV BOLUS
500.0000 mL | Freq: Once | INTRAVENOUS | Status: AC
  Administered 2017-10-22: 500 mL via INTRAVENOUS

## 2017-10-22 MED ORDER — IOHEXOL 300 MG/ML  SOLN
100.0000 mL | Freq: Once | INTRAMUSCULAR | Status: AC | PRN
  Administered 2017-10-22: 100 mL via INTRAVENOUS

## 2017-10-22 MED ORDER — TRAMADOL HCL 50 MG PO TABS: 50.0000 mg | ORAL_TABLET | Freq: Two times a day (BID) | ORAL | 0 refills | Status: DC | PRN

## 2017-10-22 MED ORDER — ONDANSETRON HCL 4 MG/2ML IJ SOLN
4.0000 mg | Freq: Once | INTRAMUSCULAR | Status: AC
  Administered 2017-10-22: 4 mg via INTRAVENOUS
  Filled 2017-10-22: qty 2

## 2017-10-22 NOTE — ED Notes (Signed)
Patient currently in CT °

## 2017-10-22 NOTE — ED Provider Notes (Signed)
[facility] EMERGENCY DEPARTMENT Provider Note   CSN: [id number] Arrival date & time: 10/22/17  1001     History   Chief Complaint Chief Complaint  Patient presents with  . Hernia    HPI Chad [name] is a 84 y.o. male.  HPI Patient presents with abdominal pain.  Has had chronic hernias particulate umbilical hernia but states it is been bothering him more recently..  Nausea without vomiting.  Pain became more severe today.  Pain in the mid abdomen.  No diarrhea or constipation.  Has pacemaker and is on chronic anticoagulation.  Pain more severe today. Past Medical History:  Diagnosis Date  . Arthritis    "hands" (01/12/2013); feet (podiatry consultation); "back" (06/12/2017)  . Atrial flutter ([place])    a. Slow ~100bpm when in 2:1; dx 04/2012. b. Placed on Xarelto.  c. s/p ablation 07-08-2012 by Dr Rayann Heman  . Carotid artery calcification    By CXR - dopplers 05/06/12 without obvious evidence of significant ICA stenosis >40%  . Colon polyps 06/26/2005  . COPD (chronic obstructive pulmonary disease) ([place])   . Diverticulosis of colon (without mention of hemorrhage)   . Gastritis, chronic    Pt denies bleeding 04/2012. EGD 2012 reportedly normal.  . GERD (gastroesophageal reflux disease)   . Hypertension   . Hyponatremia    04/2012 r/t diuretic.  . IBS (irritable bowel syndrome)   . Internal and external hemorrhoids without complication   . Macular degeneration of left eye   . Pneumonia    "@ least 5 times" (06/12/2017)  . Stroke ([place])   . Tongue cancer ([place]) 02/1997    Patient Active Problem List   Diagnosis Date Noted  . Legal blindness 10/15/2017  . Blood per rectum 09/13/2017  . Weight loss 09/13/2017  . Syncope 08/01/2017  . Wart viral 06/17/2017  . Bradycardia   . Calcium channel blocker overdose   . Back pain 06/11/2017  . Symptomatic bradycardia 06/11/2017  . Hypertension 06/11/2017  . COPD (chronic obstructive pulmonary disease) w/ bronchiectasis  06/11/2017  . History of CVA (cerebrovascular accident)-May 2019 06/11/2017  . Acute hyponatremia 06/11/2017  . Chronic kidney disease (CKD), stage IV (severe) ([place]) 06/11/2017  . Chronic diastolic heart failure ([place]) 06/11/2017  . HLD (hyperlipidemia) 06/11/2017  . Paroxysmal atrial flutter ([place]) 06/11/2017  . Cerebral embolism with cerebral infarction 06/04/2017  . CVA (cerebral vascular accident) ([place]) 06/04/2017  . Stroke-like symptoms 06/03/2017  . Bronchiectasis without complication ([place]) 76/28/3151  . DOE (dyspnea on exertion) 04/24/2017  . Cough in adult 02/13/2017  . Flatulence 02/13/2017  . Hemoptysis 06/12/2016  . Creatinine elevation 06/12/2016  . Eyebrow laceration, right, sequela 05/18/2016  . Abnormal TSH 05/18/2016  . Well adult exam 04/20/2016  . Enlarged prostate 04/20/2016  . Community acquired pneumonia   . Pneumonia of both lungs due to infectious organism 03/02/2016  . 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 Anal lesion 06/07/2015  . Gas 01/18/2015  . Aortic stenosis 06/08/2013  . Unsteadiness 02/11/2013  . Ectopic atrial tachycardia ([place]) 01/12/2013  . Atrial tachycardia ([place]) 01/12/2013  . Atrial flutter ([place]) 05/06/2012  . Aortic valve sclerosis 05/06/2012  . Acute renal failure ([place]) 05/05/2012  . 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 Anemia 07/27/2010  . Essential hypertension 11/15/2008  . COPD (chronic obstructive pulmonary disease) with chronic bronchitis ([place]) 11/15/2008  . GERD 11/15/2008  . IBS 11/15/2008  . Midsternal chest pain 11/15/2008  Past Surgical History:  Procedure Laterality Date  . ATRIAL FLUTTER ABLATION N/A 07/08/2012   Procedure: ATRIAL FLUTTER ABLATION;  Surgeon: Thompson Grayer, MD;  Location: [facility] CATH LAB;  Service: Cardiovascular;  Laterality: N/A;  . CARDIAC CATHETERIZATION  04/2012  . CATARACT EXTRACTION W/ INTRAOCULAR LENS IMPLANT Left   .  COLONOSCOPY  08/29/10   diverticulosis, internal hemorrhoids  . ESOPHAGOGASTRODUODENOSCOPY  09/20/10   normal  . [place]  . LEFT HEART CATHETERIZATION WITH CORONARY ANGIOGRAM N/A 05/06/2012   Procedure: LEFT HEART CATHETERIZATION WITH CORONARY ANGIOGRAM;  Surgeon: Peter M Martinique, MD;  Location: [facility] CATH LAB;  Service: Cardiovascular;  Laterality: N/A;  . 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 PACEMAKER IMPLANT N/A 08/01/2017   Procedure: PACEMAKER IMPLANT;  Surgeon: Constance Haw, MD;  Location: [facility] CV LAB;  Service: Cardiovascular;  Laterality: N/A;  . TRIGGER FINGER RELEASE Right 1970's   "2 fingers"  . WART FULGURATION Left 09/15/2015   Procedure: EXCISIONal biospy of left peri anual and anual canal mass;  Surgeon: Greer Pickerel, MD;  Location: [facility] ORS;  Service: General;  Laterality: Left;        Home Medications    Prior to Admission medications   Medication Sig Start Date End Date Taking? Authorizing Provider  amLODipine (NORVASC) 2.5 MG tablet Take 1 tablet (2.5 mg total) by mouth daily. 08/14/17  Yes Lelon Perla, MD  apixaban (ELIQUIS) 2.5 MG TABS tablet Take 1 tablet (2.5 mg total) by mouth 2 (two) times daily. Resume 7/28/19PM 08/07/17  Yes Lelon Perla, MD  atorvastatin (LIPITOR) 40 MG tablet Take 1 tablet (40 mg total) by mouth daily at 6 PM. 08/21/17  Yes Plotnikov, Evie Lacks, MD  cholecalciferol (VITAMIN D) 1000 units tablet Take 1 tablet (1,000 Units total) by mouth daily. 10/15/16  Yes Plotnikov, Evie Lacks, MD  clidinium-chlordiazePOXIDE (LIBRAX) 5-2.5 MG capsule Take 1 capsule by mouth 2 (two) times daily. 08/21/17  Yes Plotnikov, Evie Lacks, MD  cyanocobalamin 1000 MCG tablet Take 1,000 mcg by mouth daily.    Yes [provider]  famotidine (PEPCID) 20 MG tablet Take 1 tablet (20 mg total) by mouth at bedtime. 08/14/17  Yes Tanda Rockers, MD  fluticasone (FLONASE) 50 MCG/ACT nasal spray USE 2 SPRAYS IN EACH NOSTRIL  DAILY Patient taking differently: Place 2 sprays into both nostrils daily.  08/15/17  Yes Plotnikov, Evie Lacks, MD  ipratropium (ATROVENT) 0.02 % nebulizer solution Take 2.5 mLs (0.5 mg total) by nebulization 3 (three) times daily. [facility] 10/16/17  Yes Plotnikov, Evie Lacks, MD  multivitamin-lutein (OCUVITE-LUTEIN) CAPS capsule Take 1 capsule by mouth daily.    Yes [provider]  polyethylene glycol powder (GLYCOLAX/MIRALAX) powder Take 17 g by mouth 2 (two) times daily as needed. Patient taking differently: Take 17 g by mouth 2 (two) times daily as needed for mild constipation.  03/14/16  Yes Wardell Honour, MD  senna-docusate (SENOKOT-S) 8.6-50 MG tablet Take 1 tablet by mouth daily.   Yes [provider]  tetrahydrozoline 0.05 % ophthalmic solution Place 1 drop into both eyes as needed (dry eyes).   Yes [provider]  gabapentin (NEURONTIN) 300 MG capsule Take 3 capsules at bedtime. Patient not taking: Reported on 10/22/2017 07/22/16   Plotnikov, Evie Lacks, MD  pantoprazole (PROTONIX) 40 MG tablet Take 1 tablet (40 mg total) by mouth daily. Take 30-60 min before first meal of the day Patient not taking: Reported on  10/22/2017 08/14/17   Tanda Rockers, MD  traMADol (ULTRAM) 50 MG tablet Take 1 tablet (50 mg total) by mouth every 12 (twelve) hours as needed. 10/22/17   Davonna Belling, MD    Family History Family History  Problem Relation Age of Onset  . Heart disease Brother   . Throat cancer Brother   . Other Mother        UNSURE  . Other Father        UNSURE  . Emphysema Sister   . Emphysema Sister   . Other Brother        UNSURE  . Diabetes Unknown     Social History Social History   Tobacco Use  . Smoking status: Former Smoker    Packs/day: 0.50    Years: 20.00    Pack years: 10.00    Types: Cigarettes, Pipe, Cigars    Last attempt to quit: 12/08/1996    Years since quitting: 20.8  . Smokeless tobacco: Never Used  . Tobacco comment:      Substance Use Topics  . Alcohol use: No    Alcohol/week: 0.0 standard drinks    Frequency: Never    Comment: last since 1998  . Drug use: Never     Allergies   Dyazide [hydrochlorothiazide w-triamterene] and Lisinopril   Review of Systems Review of Systems  Constitutional: Positive for appetite change.  HENT: Negative for congestion.   Respiratory: Negative for shortness of breath.   Gastrointestinal: Positive for abdominal pain and nausea. Negative for diarrhea and vomiting.  Genitourinary: Negative for flank pain.  Musculoskeletal: Negative for back pain.  Skin: Negative for pallor.  Neurological: Negative for weakness.  Hematological: Negative for adenopathy.  Psychiatric/Behavioral: Negative for confusion.     Physical Exam Updated Vital Signs BP 140/76   Pulse 73   Temp (!) 97.3 F (36.3 C) (Oral)   Resp 18   Ht 6\' 1"  (1.854 m)   Wt 77.6 kg   SpO2 98%   BMI 22.56 kg/m   Physical Exam  Constitutional: He appears well-developed.  HENT:  Head: Normocephalic.  Neck: Neck supple.  Cardiovascular: Normal rate.  Pulmonary/Chest: He exhibits no tenderness.  Abdominal: There is tenderness.  Moderate periumbilical abdominal tenderness.  Has umbilical hernia that does reduce.  Also reducible inguinal hernias without tenderness.  Mild abdominal fullness in the mid abdomen.  Musculoskeletal: He exhibits no tenderness.  Neurological: He is alert.  Skin: Skin is warm. Capillary refill takes less than 2 seconds.     ED Treatments / Results  Labs (all labs ordered are listed, but only abnormal results are displayed) Labs Reviewed  COMPREHENSIVE METABOLIC PANEL - Abnormal; Notable for the following components:      Result Value   Sodium 133 (*)    Glucose, Bld 101 (*)    Creatinine, Ser 1.87 (*)    Albumin 3.2 (*)    GFR calc non Af Amer 31 (*)    GFR calc Af Amer 36 (*)    All other components within normal limits  PROTIME-INR - Abnormal; Notable for the  following components:   Prothrombin Time 16.9 (*)    All other components within normal limits  CBC WITH DIFFERENTIAL/PLATELET - Abnormal; Notable for the following components:   Hemoglobin 12.4 (*)    All other components within normal limits  LIPASE, BLOOD  URINALYSIS, ROUTINE W REFLEX MICROSCOPIC    EKG EKG Interpretation  Date/Time:  Tuesday October 22 2017 10:42:38 EDT Ventricular Rate:  77 PR Interval:    QRS Duration: 112 QT Interval:  412 QTC Calculation: 467 R Axis:   -60 Text Interpretation:  Sinus rhythm Left anterior fascicular block Abnormal R-wave progression, late transition Probable left ventricular hypertrophy Confirmed by Davonna Belling [phone]) on 10/22/2017 11:59:00 AM   Radiology Ct Abdomen Pelvis W Contrast  Result Date: 10/22/2017 CLINICAL DATA:  Intermittent umbilical and left inguinal hernia pain. EXAM: CT ABDOMEN AND PELVIS WITH CONTRAST TECHNIQUE: Multidetector CT imaging of the abdomen and pelvis was performed using the standard protocol following bolus administration of intravenous contrast. CONTRAST:  11mL OMNIPAQUE IOHEXOL 300 MG/ML  SOLN COMPARISON:  CT abdomen pelvis dated November 22, 2015. FINDINGS: Lower chest: No acute abnormality.  Bibasilar atelectasis/scarring. Hepatobiliary: No focal liver abnormality is seen. No gallstones, gallbladder wall thickening, or biliary dilatation. Pancreas: Mild atrophy. No ductal dilatation or surrounding inflammatory changes. Spleen: Normal in size without focal abnormality. Adrenals/Urinary Tract: The adrenal glands are unremarkable. Mild symmetric bilateral renal cortical atrophy, unchanged. Bilateral subcentimeter low-density lesions are also unchanged, but remain too small to characterize. No renal or ureteral calculi. No hydronephrosis. The bladder is unremarkable. Stomach/Bowel: Stomach is within normal limits. Appendix appears normal. No evidence of bowel wall thickening, distention, or inflammatory changes.  Vascular/Lymphatic: Aortic atherosclerosis. No enlarged abdominal or pelvic lymph nodes. Reproductive: Prostate is unremarkable. Other: Unchanged small fat containing umbilical and left greater than right inguinal hernias. No fat stranding within the hernias. No free fluid or pneumoperitoneum. Musculoskeletal: No acute or significant osseous findings. Unchanged bilateral L5 pars defects with severe L5-S1 degenerative disc disease and grade 1 spondylolisthesis. IMPRESSION: 1.  No acute intra-abdominal process. 2. Unchanged uncomplicated fat containing umbilical and bilateral inguinal hernias. 3.  Aortic atherosclerosis (ICD10-I70.0). Electronically Signed   By: Titus Dubin M.D.   On: 10/22/2017 13:18    Procedures Procedures (including critical care time)  Medications Ordered in ED Medications  sodium chloride 0.9 % bolus 500 mL (0 mLs Intravenous Stopped 10/22/17 1206)  ondansetron (ZOFRAN) injection 4 mg (4 mg Intravenous Given 10/22/17 1048)  iohexol (OMNIPAQUE) 300 MG/ML solution 100 mL (100 mLs Intravenous Contrast Given 10/22/17 1248)     Initial Impression / Assessment and Plan / ED Course  I have reviewed the triage vital signs and the nursing notes.  Pertinent labs & imaging results that were available during my care of the patient were reviewed by me and considered in my medical decision making (see chart for details).     Patient with abdominal pain.  Reducible umbilical hernia but continued pain.  CT scan does not show obstruction.  Likely more abdominal wall pain causing the pain.  Discussed with general surgery, recommend follow-up in the office since he will need more medical and cardiac clearance per  Final Clinical Impressions(s) / ED Diagnoses   Final diagnoses:  Umbilical hernia without obstruction and without gangrene    ED Discharge Orders         Ordered    traMADol (ULTRAM) 50 MG tablet  Every 12 hours PRN     10/22/17 1454           Davonna Belling,  MD 10/22/17 1532

## 2017-10-22 NOTE — Telephone Encounter (Unsigned)
Copied from [place] [id number]. Topic: General - Other >> Oct 22, 2017  3:38 PM Carolyn Stare wrote: Chad [name] with [facility] call to say pt plan wil lbe delayed she called pt today and he was having some abd pain and told her he was going to the ER and he did

## 2017-10-22 NOTE — ED Triage Notes (Signed)
Patient presents to ed c/o abd. Pain states he has had umbilical hernia and left sided inguinal   Hernia that was easily reduced by Dr. Alvino Chapel.  States he has had this hernia for about 10 years however the last 6 months the pain is worse off and on.

## 2017-10-22 NOTE — ED Notes (Signed)
Coke given to drink , no problems states he was ready to go home

## 2017-10-23 ENCOUNTER — Telehealth: Payer: Self-pay | Admitting: Internal Medicine

## 2017-10-23 DIAGNOSIS — F329 Major depressive disorder, single episode, unspecified: Secondary | ICD-10-CM | POA: Diagnosis not present

## 2017-10-23 DIAGNOSIS — K219 Gastro-esophageal reflux disease without esophagitis: Secondary | ICD-10-CM | POA: Diagnosis not present

## 2017-10-23 DIAGNOSIS — J449 Chronic obstructive pulmonary disease, unspecified: Secondary | ICD-10-CM | POA: Diagnosis not present

## 2017-10-23 DIAGNOSIS — Z8701 Personal history of pneumonia (recurrent): Secondary | ICD-10-CM | POA: Diagnosis not present

## 2017-10-23 DIAGNOSIS — H548 Legal blindness, as defined in USA: Secondary | ICD-10-CM | POA: Diagnosis not present

## 2017-10-23 DIAGNOSIS — Z7901 Long term (current) use of anticoagulants: Secondary | ICD-10-CM | POA: Diagnosis not present

## 2017-10-23 DIAGNOSIS — I350 Nonrheumatic aortic (valve) stenosis: Secondary | ICD-10-CM | POA: Diagnosis not present

## 2017-10-23 DIAGNOSIS — D631 Anemia in chronic kidney disease: Secondary | ICD-10-CM | POA: Diagnosis not present

## 2017-10-23 DIAGNOSIS — K589 Irritable bowel syndrome without diarrhea: Secondary | ICD-10-CM | POA: Diagnosis not present

## 2017-10-23 DIAGNOSIS — N184 Chronic kidney disease, stage 4 (severe): Secondary | ICD-10-CM | POA: Diagnosis not present

## 2017-10-23 DIAGNOSIS — I130 Hypertensive heart and chronic kidney disease with heart failure and stage 1 through stage 4 chronic kidney disease, or unspecified chronic kidney disease: Secondary | ICD-10-CM | POA: Diagnosis not present

## 2017-10-23 DIAGNOSIS — Z87891 Personal history of nicotine dependence: Secondary | ICD-10-CM | POA: Diagnosis not present

## 2017-10-23 DIAGNOSIS — I5032 Chronic diastolic (congestive) heart failure: Secondary | ICD-10-CM | POA: Diagnosis not present

## 2017-10-23 DIAGNOSIS — Z8673 Personal history of transient ischemic attack (TIA), and cerebral infarction without residual deficits: Secondary | ICD-10-CM | POA: Diagnosis not present

## 2017-10-23 NOTE — Telephone Encounter (Signed)
FYI

## 2017-10-23 NOTE — Telephone Encounter (Signed)
Copied from [place] [phone]. Topic: Quick Communication - Home Health Verbal Orders >> Oct 23, 2017  4:27 PM Sheran Luz wrote: Caller/Agency: Glenard Haring with [place] Number: [phone] Requesting skilled nursing:  Frequency: 1xw for 1w, 2xw for 2w and 1xw for 6w.   Also requesting PT eval  1 day 1.

## 2017-10-23 NOTE — Telephone Encounter (Signed)
Copied from [place] [phone]. Topic: Quick Communication - Rx Refill/Question >> Oct 23, 2017  4:32 PM Sheran Luz wrote: Glenard Haring from [facility] calling to request a wheelchair for pt and nebulizer that is appropriate for the solution (ipratropium 0.02 % nebulizer solution) sent in by Dr Alain Marion.

## 2017-10-24 NOTE — Telephone Encounter (Signed)
Verbals given, FYI 

## 2017-10-24 NOTE — Telephone Encounter (Signed)
Orders faxed

## 2017-10-25 ENCOUNTER — Telehealth: Payer: Self-pay | Admitting: Internal Medicine

## 2017-10-25 NOTE — Telephone Encounter (Signed)
Copied from [place] [phone]. Topic: General - Other >> Oct 25, 2017  3:17 PM Keene Breath wrote: Reason for CRM: [name] from [facility] called to request the most recent office visit notes for the patient.  Please advise and call back at [phone], ext. [place], Fax# [phone].

## 2017-10-28 ENCOUNTER — Telehealth: Payer: Self-pay | Admitting: Internal Medicine

## 2017-10-28 DIAGNOSIS — I5032 Chronic diastolic (congestive) heart failure: Secondary | ICD-10-CM | POA: Diagnosis not present

## 2017-10-28 DIAGNOSIS — I130 Hypertensive heart and chronic kidney disease with heart failure and stage 1 through stage 4 chronic kidney disease, or unspecified chronic kidney disease: Secondary | ICD-10-CM | POA: Diagnosis not present

## 2017-10-28 DIAGNOSIS — F329 Major depressive disorder, single episode, unspecified: Secondary | ICD-10-CM | POA: Diagnosis not present

## 2017-10-28 DIAGNOSIS — J449 Chronic obstructive pulmonary disease, unspecified: Secondary | ICD-10-CM | POA: Diagnosis not present

## 2017-10-28 DIAGNOSIS — N184 Chronic kidney disease, stage 4 (severe): Secondary | ICD-10-CM | POA: Diagnosis not present

## 2017-10-28 DIAGNOSIS — H548 Legal blindness, as defined in USA: Secondary | ICD-10-CM | POA: Diagnosis not present

## 2017-10-28 NOTE — Telephone Encounter (Signed)
Copied from [place] [phone]. Topic: Quick Communication - Home Health Verbal Orders >> Oct 28, 2017 11:50 AM Cecelia Byars, NT wrote: Caller/Agency:   Merry Proud ,/ [facility]   Callback Number: [phone]   [id number] /PT Frequency: 2 x week 2 week                    1 week x 2 weeks                    1 every other week  4 weeks

## 2017-10-29 ENCOUNTER — Telehealth: Payer: Self-pay | Admitting: Internal Medicine

## 2017-10-29 DIAGNOSIS — H548 Legal blindness, as defined in USA: Secondary | ICD-10-CM | POA: Diagnosis not present

## 2017-10-29 DIAGNOSIS — I5032 Chronic diastolic (congestive) heart failure: Secondary | ICD-10-CM | POA: Diagnosis not present

## 2017-10-29 DIAGNOSIS — J449 Chronic obstructive pulmonary disease, unspecified: Secondary | ICD-10-CM | POA: Diagnosis not present

## 2017-10-29 DIAGNOSIS — N184 Chronic kidney disease, stage 4 (severe): Secondary | ICD-10-CM | POA: Diagnosis not present

## 2017-10-29 DIAGNOSIS — H353211 Exudative age-related macular degeneration, right eye, with active choroidal neovascularization: Secondary | ICD-10-CM | POA: Diagnosis not present

## 2017-10-29 DIAGNOSIS — H43813 Vitreous degeneration, bilateral: Secondary | ICD-10-CM | POA: Diagnosis not present

## 2017-10-29 DIAGNOSIS — F329 Major depressive disorder, single episode, unspecified: Secondary | ICD-10-CM | POA: Diagnosis not present

## 2017-10-29 DIAGNOSIS — I130 Hypertensive heart and chronic kidney disease with heart failure and stage 1 through stage 4 chronic kidney disease, or unspecified chronic kidney disease: Secondary | ICD-10-CM | POA: Diagnosis not present

## 2017-10-29 NOTE — Telephone Encounter (Signed)
Patient calling to check the status of someone coming out to set up his nebulizer. Please advise.

## 2017-10-29 NOTE — Telephone Encounter (Signed)
Copied from [place] [phone]. Topic: Quick Communication - Rx Refill/Question >> Oct 23, 2017  4:32 PM Sheran Luz wrote: Chad [name] from [facility] calling to request a wheelchair for pt and nebulizer that is appropriate for the solution (ipratropium 0.02 % nebulizer solution) sent in by Dr Alain Marion. >> Oct 29, 2017 10:05 AM [name] wrote: Pt called in and is double checking on this wheelchair and nebulizer order? He said he would like a call back about this as soon as possible.  He has never rec'd either one  Best number  -[phone]

## 2017-10-30 ENCOUNTER — Other Ambulatory Visit: Payer: Self-pay

## 2017-10-30 ENCOUNTER — Telehealth: Payer: Self-pay

## 2017-10-30 DIAGNOSIS — Z7902 Long term (current) use of antithrombotics/antiplatelets: Secondary | ICD-10-CM | POA: Diagnosis not present

## 2017-10-30 DIAGNOSIS — Z8673 Personal history of transient ischemic attack (TIA), and cerebral infarction without residual deficits: Secondary | ICD-10-CM | POA: Diagnosis not present

## 2017-10-30 DIAGNOSIS — K4020 Bilateral inguinal hernia, without obstruction or gangrene, not specified as recurrent: Secondary | ICD-10-CM | POA: Diagnosis not present

## 2017-10-30 DIAGNOSIS — H547 Unspecified visual loss: Secondary | ICD-10-CM | POA: Diagnosis not present

## 2017-10-30 DIAGNOSIS — K429 Umbilical hernia without obstruction or gangrene: Secondary | ICD-10-CM | POA: Diagnosis not present

## 2017-10-30 DIAGNOSIS — Z950 Presence of cardiac pacemaker: Secondary | ICD-10-CM | POA: Diagnosis not present

## 2017-10-30 NOTE — Telephone Encounter (Signed)
Fax number was wrong, got correct fax number and faxed info

## 2017-10-30 NOTE — Telephone Encounter (Signed)
Spoke with pt and pt has yet to receive equipment. [facility] needed OV noted and this was faxed

## 2017-10-30 NOTE — Telephone Encounter (Signed)
   [facility] Pre-operative Risk Assessment    Request for surgical clearance:  1. What type of surgery is being performed?  [facility] Surgery   2. When is this surgery scheduled? Near Future   3. What type of clearance is required (medical clearance vs. Pharmacy clearance to hold med vs. Both)? Both  4. Are there any medications that need to be held prior to surgery and how long?   Per Clearance, the patient is currently on Eliquis so we need instructions from you as to how the patient should hold medication preoperatively.   5. Practice name and name of physician performing surgery? [facility]    6. What is your office phone number [phone]    7.   What is your office fax number [phone] Attn: Dia Crawford, RMA  8.   Anesthesia type (None, local, MAC, general) ? General   Chad [name] 10/30/2017, 3:56 PM  ________________________________________________________________

## 2017-10-30 NOTE — Telephone Encounter (Signed)
Ok Thx 

## 2017-10-31 DIAGNOSIS — H548 Legal blindness, as defined in USA: Secondary | ICD-10-CM | POA: Diagnosis not present

## 2017-10-31 DIAGNOSIS — N184 Chronic kidney disease, stage 4 (severe): Secondary | ICD-10-CM | POA: Diagnosis not present

## 2017-10-31 DIAGNOSIS — F329 Major depressive disorder, single episode, unspecified: Secondary | ICD-10-CM | POA: Diagnosis not present

## 2017-10-31 DIAGNOSIS — J449 Chronic obstructive pulmonary disease, unspecified: Secondary | ICD-10-CM | POA: Diagnosis not present

## 2017-10-31 DIAGNOSIS — I5032 Chronic diastolic (congestive) heart failure: Secondary | ICD-10-CM | POA: Diagnosis not present

## 2017-10-31 DIAGNOSIS — I130 Hypertensive heart and chronic kidney disease with heart failure and stage 1 through stage 4 chronic kidney disease, or unspecified chronic kidney disease: Secondary | ICD-10-CM | POA: Diagnosis not present

## 2017-10-31 MED ORDER — GABAPENTIN 300 MG PO CAPS: ORAL_CAPSULE | ORAL | 3 refills | Status: DC

## 2017-10-31 NOTE — Telephone Encounter (Signed)
Pt takes Eliquis for afib with CHADS2VASc score of 7 (age x2, HTN, CHF, CAD, CVA in May 2019). Pt on appropriate dose of Eliquis 2.5mg  BID due to age > 36 and SCr > 1.5. CrCl 69mL/min.   Prefer to hold Eliquis for as short of duration as possible (~24 hours prior) due to stroke 5 months ago, however pt has notable renal dysfunction as well. Will route to Dr Stanford Breed regarding appropriate length of time off Eliquis.

## 2017-10-31 NOTE — Telephone Encounter (Signed)
   Primary Cardiologist: Kirk Ruths, MD  Chart reviewed and patient interviewed over the phone today as part of pre-operative protocol coverage. Given past medical history and based on ACC/AHA guidelines, Chad [name] would be at acceptable risk for the planned procedure without further cardiovascular testing.    OK to hold Eliquis 48 hours pre op, resume when able post op.   I will route this recommendation to the requesting party via Epic fax function and remove from pre-op pool.  Please call with questions.  Kerin Ransom, PA-C 10/31/2017, 3:53 PM

## 2017-10-31 NOTE — Telephone Encounter (Signed)
Hold Eliquis 2 days prior to procedure and resume after when okay with surgery. Kirk Ruths, MD

## 2017-10-31 NOTE — Telephone Encounter (Signed)
Notified Jeff w/MD response../[email or web address] 

## 2017-11-01 ENCOUNTER — Ambulatory Visit: Payer: Self-pay | Admitting: *Deleted

## 2017-11-04 ENCOUNTER — Other Ambulatory Visit: Payer: Self-pay | Admitting: *Deleted

## 2017-11-04 DIAGNOSIS — N184 Chronic kidney disease, stage 4 (severe): Secondary | ICD-10-CM | POA: Diagnosis not present

## 2017-11-04 DIAGNOSIS — I5032 Chronic diastolic (congestive) heart failure: Secondary | ICD-10-CM | POA: Diagnosis not present

## 2017-11-04 DIAGNOSIS — I130 Hypertensive heart and chronic kidney disease with heart failure and stage 1 through stage 4 chronic kidney disease, or unspecified chronic kidney disease: Secondary | ICD-10-CM | POA: Diagnosis not present

## 2017-11-04 DIAGNOSIS — H548 Legal blindness, as defined in USA: Secondary | ICD-10-CM | POA: Diagnosis not present

## 2017-11-04 DIAGNOSIS — J449 Chronic obstructive pulmonary disease, unspecified: Secondary | ICD-10-CM | POA: Diagnosis not present

## 2017-11-04 DIAGNOSIS — F329 Major depressive disorder, single episode, unspecified: Secondary | ICD-10-CM | POA: Diagnosis not present

## 2017-11-04 NOTE — Patient Outreach (Addendum)
[facility]) Care Management  11/04/2017   Chad [name] 04-02-1931 [id number]  RN Health Coach telephone call to patient.  Hipaa compliance verified. Per patient he is doing fair. Patient is waiting on nebulizer to be delivered so he can use three times a day as per Dr ordered. Patient is legally blind. Per patient he has someone there with him all the time. Patient stated that he has a cough. Patient stated that he does not have any inhalers. He has some pain in feet at night but not while he is up. Patient has agreed to follow up outreach calls.   Current Medications:  Current Outpatient Medications  Medication Sig Dispense Refill  . amLODipine (NORVASC) 2.5 MG tablet Take 1 tablet (2.5 mg total) by mouth daily. 90 tablet 3  . apixaban (ELIQUIS) 2.5 MG TABS tablet Take 1 tablet (2.5 mg total) by mouth 2 (two) times daily. Resume 7/28/19PM 180 tablet 1  . atorvastatin (LIPITOR) 40 MG tablet Take 1 tablet (40 mg total) by mouth daily at 6 PM. 90 tablet 1  . cholecalciferol (VITAMIN D) 1000 units tablet Take 1 tablet (1,000 Units total) by mouth daily. 100 tablet 3  . clidinium-chlordiazePOXIDE (LIBRAX) 5-2.5 MG capsule Take 1 capsule by mouth 2 (two) times daily. 180 capsule 1  . cyanocobalamin 1000 MCG tablet Take 1,000 mcg by mouth daily.     . famotidine (PEPCID) 20 MG tablet Take 1 tablet (20 mg total) by mouth at bedtime. 90 tablet 1  . fluticasone (FLONASE) 50 MCG/ACT nasal spray USE 2 SPRAYS IN EACH NOSTRIL DAILY (Patient taking differently: Place 2 sprays into both nostrils daily. ) 48 g 3  . gabapentin (NEURONTIN) 300 MG capsule Take 3 capsules at bedtime. 270 capsule 3  . ipratropium (ATROVENT) 0.02 % nebulizer solution Take 2.5 mLs (0.5 mg total) by nebulization 3 (three) times daily. Via HHN 75 mL 12  . multivitamin-lutein (OCUVITE-LUTEIN) CAPS capsule Take 1 capsule by mouth daily.     . pantoprazole (PROTONIX) 40 MG tablet Take 1 tablet (40 mg total) by  mouth daily. Take 30-60 min before first meal of the day (Patient not taking: Reported on 10/22/2017) 90 tablet 1  . polyethylene glycol powder (GLYCOLAX/MIRALAX) powder Take 17 g by mouth 2 (two) times daily as needed. (Patient taking differently: Take 17 g by mouth 2 (two) times daily as needed for mild constipation. ) 3350 g 1  . senna-docusate (SENOKOT-S) 8.6-50 MG tablet Take 1 tablet by mouth daily.    [name] tetrahydrozoline 0.05 % ophthalmic solution Place 1 drop into both eyes as needed (dry eyes).    . traMADol (ULTRAM) 50 MG tablet Take 1 tablet (50 mg total) by mouth every 12 (twelve) hours as needed. 6 tablet 0   No current facility-administered medications for this visit.     Functional Status:  In your present state of health, do you have any difficulty performing the following activities: 11/04/2017 08/07/2017  Hearing? N N  Vision? Y Y  Comment - -  Difficulty concentrating or making decisions? Y Y  Comment - -  Walking or climbing stairs? Y Y  Comment - -  Dressing or bathing? Y Y  Doing errands, shopping? Tempie Donning  Preparing Food and eating ? Y Y  Using the Toilet? N N  In the past six months, have you accidently leaked urine? N N  Do you have problems with loss of bowel control? N N  Managing your Medications? N N  Managing your Finances? N N  Housekeeping or managing your Housekeeping? Y Y  Some recent data might be hidden    Fall/Depression Screening: Fall Risk  11/04/2017 08/07/2017 08/07/2017  Falls in the past year? Yes Yes Yes  Comment - [facility]i Telephone Survey: data to providers prior to load -  Number falls in past yr: 2 or more 2 or more 2 or more  Comment - Emmi Telephone Survey Actual Response = 4 -  Injury with Fall? No No No  Comment - - -  Risk Factor Category  High Fall Risk - High Fall Risk  Risk for fall due to : History of fall(s);Impaired balance/gait;Impaired mobility;Impaired vision - History of fall(s)  Follow up Falls evaluation completed;Education  provided - Falls prevention discussed   PHQ 2/9 Scores 08/07/2017 06/17/2017 03/14/2016 02/22/2016 02/04/2016 12/06/2015 11/16/2015  PHQ - 2 Score 1 0 0 0 0 0 0    Assessment:  Patient does not use inhalers Patient is awaiting on nebulizer to be delivered Patient has a non productive cough Patient will benefit from [place] telephonic outreach for education and support for COPD self management.  Plan:  RN discussed patient receiving nebulizer RN discussed keeping appointments and medication adherence RN sent COPD packet RN sent 2019/2020 calendar book RN sent Clinical key education  For COPD exacerbation RN sent Clinical key education COPD and physical activity RN sent Clinical key education Eating plan for COPD RN sent Clinical key education COPD Form-Action Plan RN sent Ensure/ Boost coupons RN will follow up within the month of December  Johny Shock BSN RN [facility] [phone]

## 2017-11-05 ENCOUNTER — Telehealth: Payer: Self-pay

## 2017-11-05 ENCOUNTER — Encounter: Payer: Self-pay | Admitting: Cardiology

## 2017-11-05 ENCOUNTER — Ambulatory Visit (INDEPENDENT_AMBULATORY_CARE_PROVIDER_SITE_OTHER): Payer: Medicare Other | Admitting: Cardiology

## 2017-11-05 VITALS — BP 118/66 | HR 98 | Ht 73.0 in | Wt 166.0 lb

## 2017-11-05 DIAGNOSIS — I10 Essential (primary) hypertension: Secondary | ICD-10-CM | POA: Diagnosis not present

## 2017-11-05 DIAGNOSIS — I495 Sick sinus syndrome: Secondary | ICD-10-CM

## 2017-11-05 DIAGNOSIS — I639 Cerebral infarction, unspecified: Secondary | ICD-10-CM

## 2017-11-05 DIAGNOSIS — I480 Paroxysmal atrial fibrillation: Secondary | ICD-10-CM

## 2017-11-05 NOTE — Patient Instructions (Addendum)
Medication Instructions:  Your physician recommends that you continue on your current medications as directed. Please refer to the Current Medication list given to you today.  If you need a refill on your cardiac medications before your next appointment, please call your pharmacy.   Lab work: None ordered  Testing/Procedures: None ordered   Follow-Up: Remote monitoring is used to monitor your Pacemaker from home. This monitoring reduces the number of office visits required to check your device to one time per year. It allows [place] to keep an eye on the functioning of your device to ensure it is working properly. You are scheduled for a device check from home on 02/04/2018. You may send your transmission at any time that day. If you have a wireless device, the transmission will be sent automatically. After your physician reviews your transmission, you will receive a postcard with your next transmission date.  At [facility], you and your health needs are our priority.  As part of our continuing mission to provide you with exceptional heart care, we have created designated Provider Care Teams.  These Care Teams include your primary Cardiologist (physician) and Advanced Practice Providers (APPs -  Physician Assistants and Nurse Practitioners) who all work together to provide you with the care you need, when you need it. You will need a follow up appointment in 9 months.  Please call our office 2 months in advance to schedule this appointment.  You may see Will Meredith Leeds, MD or one of the following Advanced Practice Providers on your designated Care Team:   Chanetta Marshall, NP . Tommye Standard, PA-C  Thank you for choosing [facility]!!   Trinidad Curet, RN [phone]

## 2017-11-05 NOTE — Progress Notes (Signed)
Electrophysiology Office Note   Date:  11/05/2017   ID:  WEILAND TOMICH, DOB 11/03/1931, MRN [id number]  PCP:  Cassandria Anger, MD  Cardiologist:  Stanford Breed Primary Electrophysiologist:  Will Meredith Leeds, MD    No chief complaint on file.    History of Present Illness: Chad [name] is a 84 y.o. male who is being seen today for the evaluation of sick sinus syndrome at the request of Plotnikov, Evie Lacks, MD. Presenting today for electrophysiology evaluation.  He has a history of paroxysmal atrial fibrillation, prior CVA, COPD, GERD, and hypertension.  He presented to the hospital after recurrent episodes of syncope.  Holter monitor demonstrated sinus pauses as well as intermittent junctional bradycardia.  He thus had a Saint Jude dual-chamber pacemaker implanted on 08/01/2017.    Today, he denies symptoms of palpitations, chest pain, shortness of breath, orthopnea, PND, lower extremity edema, claudication, dizziness, presyncope, syncope, bleeding, or neurologic sequela. The patient is tolerating medications without difficulties.    Past Medical History:  Diagnosis Date  . Arthritis    "hands" (01/12/2013); feet (podiatry consultation); "back" (06/12/2017)  . Atrial flutter ([place])    a. Slow ~100bpm when in 2:1; dx 04/2012. b. Placed on Xarelto.  c. s/p ablation 07-08-2012 by Dr Rayann Heman  . Carotid artery calcification    By CXR - dopplers 05/06/12 without obvious evidence of significant ICA stenosis >40%  . Colon polyps 06/26/2005  . COPD (chronic obstructive pulmonary disease) ([place])   . Diverticulosis of colon (without mention of hemorrhage)   . Gastritis, chronic    Pt denies bleeding 04/2012. EGD 2012 reportedly normal.  . GERD (gastroesophageal reflux disease)   . Hypertension   . Hyponatremia    04/2012 r/t diuretic.  . IBS (irritable bowel syndrome)   . Internal and external hemorrhoids without complication   . Macular degeneration of left eye   . Pneumonia    "@ least  5 times" (06/12/2017)  . Stroke ([place])   . Tongue cancer ([place]) 02/1997   Past Surgical History:  Procedure Laterality Date  . ATRIAL FLUTTER ABLATION N/A 07/08/2012   Procedure: ATRIAL FLUTTER ABLATION;  Surgeon: Thompson Grayer, MD;  Location: [facility] CATH LAB;  Service: Cardiovascular;  Laterality: N/A;  . CARDIAC CATHETERIZATION  04/2012  . CATARACT EXTRACTION W/ INTRAOCULAR LENS IMPLANT Left   . COLONOSCOPY  08/29/10   diverticulosis, internal hemorrhoids  . ESOPHAGOGASTRODUODENOSCOPY  09/20/10   normal  . [place]  . LEFT HEART CATHETERIZATION WITH CORONARY ANGIOGRAM N/A 05/06/2012   Procedure: LEFT HEART CATHETERIZATION WITH CORONARY ANGIOGRAM;  Surgeon: Peter M Martinique, MD;  Location: [facility] CATH LAB;  Service: Cardiovascular;  Laterality: N/A;  . 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 PACEMAKER IMPLANT N/A 08/01/2017   Procedure: PACEMAKER IMPLANT;  Surgeon: Constance Haw, MD;  Location: [facility] CV LAB;  Service: Cardiovascular;  Laterality: N/A;  . TRIGGER FINGER RELEASE Right 1970's   "2 fingers"  . WART FULGURATION Left 09/15/2015   Procedure: EXCISIONal biospy of left peri anual and anual canal mass;  Surgeon: Greer Pickerel, MD;  Location: [facility] ORS;  Service: General;  Laterality: Left;     Current Outpatient Medications  Medication Sig Dispense Refill  . amLODipine (NORVASC) 2.5 MG tablet Take 1 tablet (2.5 mg total) by mouth daily. 90 tablet 3  . apixaban (ELIQUIS) 2.5 MG TABS tablet Take 1 tablet (2.5 mg total) by mouth 2 (two) times daily. Resume 7/28/19PM 180  tablet 1  . atorvastatin (LIPITOR) 40 MG tablet Take 1 tablet (40 mg total) by mouth daily at 6 PM. 90 tablet 1  . cholecalciferol (VITAMIN D) 1000 units tablet Take 1 tablet (1,000 Units total) by mouth daily. 100 tablet 3  . clidinium-chlordiazePOXIDE (LIBRAX) 5-2.5 MG capsule Take 1 capsule by mouth 2 (two) times daily. 180 capsule 1  . cyanocobalamin 1000 MCG tablet Take 1,000 mcg by mouth  daily.     . famotidine (PEPCID) 20 MG tablet Take 1 tablet (20 mg total) by mouth at bedtime. 90 tablet 1  . fluticasone (FLONASE) 50 MCG/ACT nasal spray USE 2 SPRAYS IN EACH NOSTRIL DAILY (Patient taking differently: Place 2 sprays into both nostrils daily. ) 48 g 3  . gabapentin (NEURONTIN) 300 MG capsule Take 3 capsules at bedtime. 270 capsule 3  . ipratropium (ATROVENT) 0.02 % nebulizer solution Take 2.5 mLs (0.5 mg total) by nebulization 3 (three) times daily. Via HHN 75 mL 12  . multivitamin-lutein (OCUVITE-LUTEIN) CAPS capsule Take 1 capsule by mouth daily.     . pantoprazole (PROTONIX) 40 MG tablet Take 1 tablet (40 mg total) by mouth daily. Take 30-60 min before first meal of the day 90 tablet 1  . polyethylene glycol powder (GLYCOLAX/MIRALAX) powder Take 17 g by mouth 2 (two) times daily as needed. (Patient taking differently: Take 17 g by mouth 2 (two) times daily as needed for mild constipation. ) 3350 g 1  . senna-docusate (SENOKOT-S) 8.6-50 MG tablet Take 1 tablet by mouth daily.    [name] tetrahydrozoline 0.05 % ophthalmic solution Place 1 drop into both eyes as needed (dry eyes).    . traMADol (ULTRAM) 50 MG tablet Take 1 tablet (50 mg total) by mouth every 12 (twelve) hours as needed. 6 tablet 0   No current facility-administered medications for this visit.     Allergies:   Dyazide [hydrochlorothiazide w-triamterene] and Lisinopril   Social History:  The patient  reports that he quit smoking about 20 years ago. His smoking use included cigarettes, pipe, and cigars. He has a 10.00 pack-year smoking history. He has never used smokeless tobacco. He reports that he does not drink alcohol or use drugs.   Family History:  The patient's family history includes Diabetes in his unknown relative; Emphysema in his sister and sister; Heart disease in his brother; Other in his brother, father, and mother; Throat cancer in his brother.    ROS:  Please see the history of present illness.    Otherwise, review of systems is positive for none.   All other systems are reviewed and negative.    PHYSICAL EXAM: VS:  BP 118/66   Pulse 98   Ht 6\' 1"  (1.854 m)   Wt 166 lb (75.3 kg)   SpO2 98%   BMI 21.90 kg/m  , BMI Body mass index is 21.9 kg/m. GEN: Well nourished, well developed, in no acute distress  HEENT: normal  Neck: no JVD, carotid bruits, or masses Cardiac: RRR; no murmurs, rubs, or gallops,no edema  Respiratory:  clear to auscultation bilaterally, normal work of breathing GI: soft, nontender, nondistended, + BS MS: no deformity or atrophy  Skin: warm and dry, device pocket is well healed Neuro:  Strength and sensation are intact Psych: euthymic mood, full affect  EKG:  EKG is ordered today. Personal review of the ekg ordered shows rhythm with PACs, rate 98  Device interrogation is reviewed today in detail.  See PaceArt for details.   Recent  Labs: 04/24/2017: Pro B Natriuretic peptide (BNP) 246.0 06/11/2017: TSH 5.393 08/01/2017: B Natriuretic Peptide 475.0; Magnesium 2.1 10/22/2017: ALT 19; BUN 9; Creatinine, Ser 1.87; Hemoglobin 12.4; Platelets 235; Potassium 4.0; Sodium 133    Lipid Panel     Component Value Date/Time   CHOL 176 06/04/2017 0626   TRIG 113 06/04/2017 0626   HDL 31 (L) 06/04/2017 0626   CHOLHDL 5.7 06/04/2017 0626   VLDL 23 06/04/2017 0626   LDLCALC 122 (H) 06/04/2017 0626     Wt Readings from Last 3 Encounters:  11/05/17 166 lb (75.3 kg)  10/22/17 171 lb (77.6 kg)  10/15/17 174 lb (78.9 kg)      Other studies Reviewed: Additional studies/ records that were reviewed today include: TTE 06/04/17  Review of the above records today demonstrates:  - Left ventricle: The cavity size was normal. There was mild   concentric hypertrophy. Systolic function was normal. The   estimated ejection fraction was in the range of 55% to 60%.   Hypokinesis of the basal-midanteroseptal and inferoseptal   myocardium. Doppler parameters are consistent  with a reversible   restrictive pattern, indicative of decreased left ventricular   diastolic compliance and/or increased left atrial pressure (grade   3 diastolic dysfunction). - Aortic valve: Valve mobility was restricted. Transvalvular   velocity was within the normal range. There was no stenosis.   There was no regurgitation. Valve area (VTI): 2.75 cm^2. Valve   area (Vmax): 3.08 cm^2. Valve area (Vmean): 3.6 cm^2. - Mitral valve: Calcified annulus. Transvalvular velocity was   within the normal range. There was no evidence for stenosis.   There was mild regurgitation. - Left atrium: The atrium was mildly dilated. - Right ventricle: Systolic function was normal. - Tricuspid valve: There was moderate-severe regurgitation. - Pulmonic valve: There was moderate regurgitation. - Pulmonary arteries: Systolic pressure was mildly increased. PA   peak pressure: 45 mm Hg (S).   ASSESSMENT AND PLAN:  1.  Sick sinus syndrome: Status post Saint Jude dual-chamber pacemaker implanted 08/01/2017.  Vice functioning appropriately.  No changes.  2.  Paroxysmal atrial fibrillation: Currently on Eliquis.  No episodes of atrial fibrillation noted on his device.  Continue Eliquis.  This patients CHA2DS2-VASc Score and unadjusted Ischemic Stroke Rate (% per year) is equal to 7.2 % stroke rate/year from a score of 5  Above score calculated as 1 point each if present [CHF, HTN, DM, Vascular=MI/PAD/Aortic Plaque, Age if 65-74, or Male] Above score calculated as 2 points each if present [Age > 75, or Stroke/TIA/TE]  3.  Hypertension: Well-controlled today.  No changes.    Current medicines are reviewed at length with the patient today.   The patient does not have concerns regarding his medicines.  The following changes were made today:  none  Labs/ tests ordered today include:  Orders Placed This Encounter  Procedures  . EKG 12-Lead     Disposition:   FU with Will Camnitz 9  months  Signed, Will Meredith Leeds, MD  11/05/2017 9:55 AM     [facility] [address] [place]  [address] [phone] (office) [phone] (fax)

## 2017-11-05 NOTE — Telephone Encounter (Signed)
Clearance form fax to [facility] at [phone]. Form fax twice and confirmed. Clearance form states its recommend by Janett Billow NP pt to wait another month for surgery. Pt is only 5 months post stroke from 06/03/2017. Pts 6 months will be 12/04/2017. After 12/04/2017 pt can be cleared to hold eliquis 2-3 days prior to procedure with a small but acceptable peri-procedure risk of TIA/Stroke. Patient can resume eliquis when safe per his surgeon.

## 2017-11-06 DIAGNOSIS — H548 Legal blindness, as defined in USA: Secondary | ICD-10-CM | POA: Diagnosis not present

## 2017-11-06 DIAGNOSIS — I130 Hypertensive heart and chronic kidney disease with heart failure and stage 1 through stage 4 chronic kidney disease, or unspecified chronic kidney disease: Secondary | ICD-10-CM | POA: Diagnosis not present

## 2017-11-06 DIAGNOSIS — F329 Major depressive disorder, single episode, unspecified: Secondary | ICD-10-CM | POA: Diagnosis not present

## 2017-11-06 DIAGNOSIS — N184 Chronic kidney disease, stage 4 (severe): Secondary | ICD-10-CM | POA: Diagnosis not present

## 2017-11-06 DIAGNOSIS — I5032 Chronic diastolic (congestive) heart failure: Secondary | ICD-10-CM | POA: Diagnosis not present

## 2017-11-06 DIAGNOSIS — J449 Chronic obstructive pulmonary disease, unspecified: Secondary | ICD-10-CM | POA: Diagnosis not present

## 2017-11-07 DIAGNOSIS — I130 Hypertensive heart and chronic kidney disease with heart failure and stage 1 through stage 4 chronic kidney disease, or unspecified chronic kidney disease: Secondary | ICD-10-CM | POA: Diagnosis not present

## 2017-11-07 DIAGNOSIS — I5032 Chronic diastolic (congestive) heart failure: Secondary | ICD-10-CM | POA: Diagnosis not present

## 2017-11-07 DIAGNOSIS — F329 Major depressive disorder, single episode, unspecified: Secondary | ICD-10-CM | POA: Diagnosis not present

## 2017-11-07 DIAGNOSIS — N184 Chronic kidney disease, stage 4 (severe): Secondary | ICD-10-CM | POA: Diagnosis not present

## 2017-11-07 DIAGNOSIS — H548 Legal blindness, as defined in USA: Secondary | ICD-10-CM | POA: Diagnosis not present

## 2017-11-07 DIAGNOSIS — J449 Chronic obstructive pulmonary disease, unspecified: Secondary | ICD-10-CM | POA: Diagnosis not present

## 2017-11-08 DIAGNOSIS — F329 Major depressive disorder, single episode, unspecified: Secondary | ICD-10-CM | POA: Diagnosis not present

## 2017-11-08 DIAGNOSIS — N184 Chronic kidney disease, stage 4 (severe): Secondary | ICD-10-CM | POA: Diagnosis not present

## 2017-11-08 DIAGNOSIS — J449 Chronic obstructive pulmonary disease, unspecified: Secondary | ICD-10-CM | POA: Diagnosis not present

## 2017-11-08 DIAGNOSIS — H548 Legal blindness, as defined in USA: Secondary | ICD-10-CM | POA: Diagnosis not present

## 2017-11-08 DIAGNOSIS — I130 Hypertensive heart and chronic kidney disease with heart failure and stage 1 through stage 4 chronic kidney disease, or unspecified chronic kidney disease: Secondary | ICD-10-CM | POA: Diagnosis not present

## 2017-11-08 DIAGNOSIS — I5032 Chronic diastolic (congestive) heart failure: Secondary | ICD-10-CM | POA: Diagnosis not present

## 2017-11-08 LAB — CUP PACEART INCLINIC DEVICE CHECK
Implantable Lead Implant Date: 20190725
Implantable Lead Location: 753859
Implantable Lead Location: 753860
Implantable Pulse Generator Implant Date: 20190725
MDC IDC LEAD IMPLANT DT: 20190725
MDC IDC SESS DTM: 20191101095122
Pulse Gen Model: 2272
Pulse Gen Serial Number: 9047588

## 2017-11-13 ENCOUNTER — Ambulatory Visit: Payer: Medicare Other | Admitting: Internal Medicine

## 2017-11-13 DIAGNOSIS — F329 Major depressive disorder, single episode, unspecified: Secondary | ICD-10-CM | POA: Diagnosis not present

## 2017-11-13 DIAGNOSIS — I130 Hypertensive heart and chronic kidney disease with heart failure and stage 1 through stage 4 chronic kidney disease, or unspecified chronic kidney disease: Secondary | ICD-10-CM | POA: Diagnosis not present

## 2017-11-13 DIAGNOSIS — J449 Chronic obstructive pulmonary disease, unspecified: Secondary | ICD-10-CM | POA: Diagnosis not present

## 2017-11-13 DIAGNOSIS — H548 Legal blindness, as defined in USA: Secondary | ICD-10-CM | POA: Diagnosis not present

## 2017-11-13 DIAGNOSIS — N184 Chronic kidney disease, stage 4 (severe): Secondary | ICD-10-CM | POA: Diagnosis not present

## 2017-11-13 DIAGNOSIS — I5032 Chronic diastolic (congestive) heart failure: Secondary | ICD-10-CM | POA: Diagnosis not present

## 2017-11-14 DIAGNOSIS — J449 Chronic obstructive pulmonary disease, unspecified: Secondary | ICD-10-CM | POA: Diagnosis not present

## 2017-11-14 DIAGNOSIS — I130 Hypertensive heart and chronic kidney disease with heart failure and stage 1 through stage 4 chronic kidney disease, or unspecified chronic kidney disease: Secondary | ICD-10-CM | POA: Diagnosis not present

## 2017-11-14 DIAGNOSIS — N184 Chronic kidney disease, stage 4 (severe): Secondary | ICD-10-CM | POA: Diagnosis not present

## 2017-11-14 DIAGNOSIS — F329 Major depressive disorder, single episode, unspecified: Secondary | ICD-10-CM | POA: Diagnosis not present

## 2017-11-14 DIAGNOSIS — I5032 Chronic diastolic (congestive) heart failure: Secondary | ICD-10-CM | POA: Diagnosis not present

## 2017-11-14 DIAGNOSIS — H548 Legal blindness, as defined in USA: Secondary | ICD-10-CM | POA: Diagnosis not present

## 2017-11-15 ENCOUNTER — Encounter: Payer: Self-pay | Admitting: Internal Medicine

## 2017-11-15 ENCOUNTER — Ambulatory Visit (INDEPENDENT_AMBULATORY_CARE_PROVIDER_SITE_OTHER): Payer: Medicare Other | Admitting: Internal Medicine

## 2017-11-15 DIAGNOSIS — I639 Cerebral infarction, unspecified: Secondary | ICD-10-CM

## 2017-11-15 DIAGNOSIS — I5032 Chronic diastolic (congestive) heart failure: Secondary | ICD-10-CM

## 2017-11-15 DIAGNOSIS — I10 Essential (primary) hypertension: Secondary | ICD-10-CM

## 2017-11-15 DIAGNOSIS — I4892 Unspecified atrial flutter: Secondary | ICD-10-CM | POA: Diagnosis not present

## 2017-11-15 DIAGNOSIS — R634 Abnormal weight loss: Secondary | ICD-10-CM | POA: Diagnosis not present

## 2017-11-15 MED ORDER — GABAPENTIN 300 MG PO CAPS: ORAL_CAPSULE | ORAL | 3 refills | Status: DC

## 2017-11-15 NOTE — Progress Notes (Signed)
Subjective:  Patient ID: Chad [name], male    DOB: 1931-09-13  Age: 84 y.o. MRN: [id number]  CC: No chief complaint on file.   HPI Chad [name] presents for HTN. A fib, COPD f/u  Outpatient Medications Prior to Visit  Medication Sig Dispense Refill  . amLODipine (NORVASC) 2.5 MG tablet Take 1 tablet (2.5 mg total) by mouth daily. 90 tablet 3  . apixaban (ELIQUIS) 2.5 MG TABS tablet Take 1 tablet (2.5 mg total) by mouth 2 (two) times daily. Resume 7/28/19PM 180 tablet 1  . atorvastatin (LIPITOR) 40 MG tablet Take 1 tablet (40 mg total) by mouth daily at 6 PM. 90 tablet 1  . cholecalciferol (VITAMIN D) 1000 units tablet Take 1 tablet (1,000 Units total) by mouth daily. 100 tablet 3  . clidinium-chlordiazePOXIDE (LIBRAX) 5-2.5 MG capsule Take 1 capsule by mouth 2 (two) times daily. 180 capsule 1  . cyanocobalamin 1000 MCG tablet Take 1,000 mcg by mouth daily.     . famotidine (PEPCID) 20 MG tablet Take 1 tablet (20 mg total) by mouth at bedtime. 90 tablet 1  . fluticasone (FLONASE) 50 MCG/ACT nasal spray USE 2 SPRAYS IN EACH NOSTRIL DAILY (Patient taking differently: Place 2 sprays into both nostrils daily. ) 48 g 3  . ipratropium (ATROVENT) 0.02 % nebulizer solution Take 2.5 mLs (0.5 mg total) by nebulization 3 (three) times daily. Via [facility] 75 mL 12  . multivitamin-lutein (OCUVITE-LUTEIN) CAPS capsule Take 1 capsule by mouth daily.     . pantoprazole (PROTONIX) 40 MG tablet Take 1 tablet (40 mg total) by mouth daily. Take 30-60 min before first meal of the day 90 tablet 1  . polyethylene glycol powder (GLYCOLAX/MIRALAX) powder Take 17 g by mouth 2 (two) times daily as needed. (Patient taking differently: Take 17 g by mouth 2 (two) times daily as needed for mild constipation. ) 3350 g 1  . senna-docusate (SENOKOT-S) 8.6-50 MG tablet Take 1 tablet by mouth daily.    [name] tetrahydrozoline 0.05 % ophthalmic solution Place 1 drop into both eyes as needed (dry eyes).    . traMADol (ULTRAM) 50  MG tablet Take 1 tablet (50 mg total) by mouth every 12 (twelve) hours as needed. 6 tablet 0  . gabapentin (NEURONTIN) 300 MG capsule Take 3 capsules at bedtime. 270 capsule 3   No facility-administered medications prior to visit.     ROS: Review of Systems  Constitutional: Positive for fatigue. Negative for appetite change and unexpected weight change.  HENT: Negative for congestion, nosebleeds, sneezing, sore throat and trouble swallowing.   Eyes: Negative for itching and visual disturbance.  Respiratory: Positive for shortness of breath. Negative for cough.   Cardiovascular: Negative for chest pain, palpitations and leg swelling.  Gastrointestinal: Negative for abdominal distention, blood in stool, diarrhea and nausea.  Genitourinary: Negative for frequency and hematuria.  Musculoskeletal: Negative for back pain, gait problem, joint swelling and neck pain.  Skin: Negative for rash.  Neurological: Positive for weakness. Negative for dizziness, tremors and speech difficulty.  Psychiatric/Behavioral: Negative for agitation, dysphoric mood and sleep disturbance. The patient is not nervous/anxious.   chronic SOB, weakness  Objective:  BP 118/68 (BP Location: Right Arm, Patient Position: Sitting, Cuff Size: Normal)   Pulse 82   Temp 97.7 F (36.5 C) (Oral)   Ht 6\' 1"  (1.854 m)   Wt 163 lb (73.9 kg)   BMI 21.51 kg/m   BP Readings from Last 3 Encounters:  11/15/17 118/68  11/05/17  118/66  10/22/17 140/76    Wt Readings from Last 3 Encounters:  11/15/17 163 lb (73.9 kg)  11/05/17 166 lb (75.3 kg)  10/22/17 171 lb (77.6 kg)    Physical Exam  Constitutional: He is oriented to person, place, and time. He appears well-developed. No distress.  NAD  HENT:  Mouth/Throat: Oropharynx is clear and moist.  Eyes: Pupils are equal, round, and reactive to light. Conjunctivae are normal.  Neck: Normal range of motion. No JVD present. No thyromegaly present.  Cardiovascular: Normal rate,  normal heart sounds and intact distal pulses. Exam reveals no gallop and no friction rub.  No murmur heard. Pulmonary/Chest: Effort normal and breath sounds normal. No respiratory distress. He has no wheezes. He has no rales. He exhibits no tenderness.  Abdominal: Soft. Bowel sounds are normal. He exhibits no distension and no mass. There is no tenderness. There is no rebound and no guarding.  Musculoskeletal: Normal range of motion. He exhibits no edema or tenderness.  Lymphadenopathy:    He has no cervical adenopathy.  Neurological: He is alert and oriented to person, place, and time. He has normal reflexes. No cranial nerve deficit. He exhibits normal muscle tone. He displays a negative Romberg sign. Coordination abnormal. Gait normal.  Skin: Skin is warm and dry. No rash noted.  Psychiatric: He has a normal mood and affect. His behavior is normal. Judgment and thought content normal.  irreg irreg RR Ataxic gait decr BS B  Lab Results  Component Value Date   WBC 9.4 10/22/2017   HGB 12.4 (L) 10/22/2017   HCT 39.7 10/22/2017   PLT 235 10/22/2017   GLUCOSE 101 (H) 10/22/2017   CHOL 176 06/04/2017   TRIG 113 06/04/2017   HDL 31 (L) 06/04/2017   LDLCALC 122 (H) 06/04/2017   ALT 19 10/22/2017   AST 39 10/22/2017   NA 133 (L) 10/22/2017   K 4.0 10/22/2017   CL 98 10/22/2017   CREATININE 1.87 (H) 10/22/2017   BUN 9 10/22/2017   CO2 23 10/22/2017   TSH 5.393 (H) 06/11/2017   PSA 1.13 04/24/2016   INR 1.39 10/22/2017   HGBA1C 5.6 06/04/2017    Ct Abdomen Pelvis W Contrast  Result Date: 10/22/2017 CLINICAL DATA:  Intermittent umbilical and left inguinal hernia pain. EXAM: CT ABDOMEN AND PELVIS WITH CONTRAST TECHNIQUE: Multidetector CT imaging of the abdomen and pelvis was performed using the standard protocol following bolus administration of intravenous contrast. CONTRAST:  118mL OMNIPAQUE IOHEXOL 300 MG/ML  SOLN COMPARISON:  CT abdomen pelvis dated November 22, 2015. FINDINGS:  Lower chest: No acute abnormality.  Bibasilar atelectasis/scarring. Hepatobiliary: No focal liver abnormality is seen. No gallstones, gallbladder wall thickening, or biliary dilatation. Pancreas: Mild atrophy. No ductal dilatation or surrounding inflammatory changes. Spleen: Normal in size without focal abnormality. Adrenals/Urinary Tract: The adrenal glands are unremarkable. Mild symmetric bilateral renal cortical atrophy, unchanged. Bilateral subcentimeter low-density lesions are also unchanged, but remain too small to characterize. No renal or ureteral calculi. No hydronephrosis. The bladder is unremarkable. Stomach/Bowel: Stomach is within normal limits. Appendix appears normal. No evidence of bowel wall thickening, distention, or inflammatory changes. Vascular/Lymphatic: Aortic atherosclerosis. No enlarged abdominal or pelvic lymph nodes. Reproductive: Prostate is unremarkable. Other: Unchanged small fat containing umbilical and left greater than right inguinal hernias. No fat stranding within the hernias. No free fluid or pneumoperitoneum. Musculoskeletal: No acute or significant osseous findings. Unchanged bilateral L5 pars defects with severe L5-S1 degenerative disc disease and grade 1 spondylolisthesis. IMPRESSION: 1.  No acute intra-abdominal process. 2. Unchanged uncomplicated fat containing umbilical and bilateral inguinal hernias. 3.  Aortic atherosclerosis (ICD10-I70.0). Electronically Signed   By: Titus Dubin M.D.   On: 10/22/2017 13:18    Assessment & Plan:   There are no diagnoses linked to this encounter.   Meds ordered this encounter  Medications  . gabapentin (NEURONTIN) 300 MG capsule    Sig: Take 3 capsules at bedtime.    Dispense:  270 capsule    Refill:  3     Follow-up: No follow-ups on file.  Walker Kehr, MD

## 2017-11-19 DIAGNOSIS — I130 Hypertensive heart and chronic kidney disease with heart failure and stage 1 through stage 4 chronic kidney disease, or unspecified chronic kidney disease: Secondary | ICD-10-CM | POA: Diagnosis not present

## 2017-11-19 DIAGNOSIS — I5032 Chronic diastolic (congestive) heart failure: Secondary | ICD-10-CM | POA: Diagnosis not present

## 2017-11-19 DIAGNOSIS — H548 Legal blindness, as defined in USA: Secondary | ICD-10-CM | POA: Diagnosis not present

## 2017-11-19 DIAGNOSIS — F329 Major depressive disorder, single episode, unspecified: Secondary | ICD-10-CM | POA: Diagnosis not present

## 2017-11-19 DIAGNOSIS — N184 Chronic kidney disease, stage 4 (severe): Secondary | ICD-10-CM | POA: Diagnosis not present

## 2017-11-19 DIAGNOSIS — J449 Chronic obstructive pulmonary disease, unspecified: Secondary | ICD-10-CM | POA: Diagnosis not present

## 2017-11-21 ENCOUNTER — Ambulatory Visit: Payer: Self-pay | Admitting: General Surgery

## 2017-11-24 ENCOUNTER — Emergency Department (HOSPITAL_COMMUNITY): Payer: Medicare Other

## 2017-11-24 ENCOUNTER — Inpatient Hospital Stay (HOSPITAL_COMMUNITY)
Admission: EM | Admit: 2017-11-24 | Discharge: 2017-11-27 | DRG: 871 | Disposition: A | Discharge status: Home Health | Payer: Medicare Other | Attending: Internal Medicine | Admitting: Internal Medicine

## 2017-11-24 ENCOUNTER — Other Ambulatory Visit: Payer: Self-pay

## 2017-11-24 ENCOUNTER — Encounter (HOSPITAL_COMMUNITY): Payer: Self-pay

## 2017-11-24 DIAGNOSIS — Z87891 Personal history of nicotine dependence: Secondary | ICD-10-CM | POA: Diagnosis not present

## 2017-11-24 DIAGNOSIS — Z8249 Family history of ischemic heart disease and other diseases of the circulatory system: Secondary | ICD-10-CM | POA: Diagnosis not present

## 2017-11-24 DIAGNOSIS — I2510 Atherosclerotic heart disease of native coronary artery without angina pectoris: Secondary | ICD-10-CM | POA: Diagnosis present

## 2017-11-24 DIAGNOSIS — M19042 Primary osteoarthritis, left hand: Secondary | ICD-10-CM | POA: Diagnosis present

## 2017-11-24 DIAGNOSIS — R627 Adult failure to thrive: Secondary | ICD-10-CM | POA: Diagnosis present

## 2017-11-24 DIAGNOSIS — A419 Sepsis, unspecified organism: Secondary | ICD-10-CM | POA: Diagnosis not present

## 2017-11-24 DIAGNOSIS — N179 Acute kidney failure, unspecified: Secondary | ICD-10-CM

## 2017-11-24 DIAGNOSIS — R6520 Severe sepsis without septic shock: Secondary | ICD-10-CM

## 2017-11-24 DIAGNOSIS — R4182 Altered mental status, unspecified: Secondary | ICD-10-CM | POA: Diagnosis not present

## 2017-11-24 DIAGNOSIS — Z950 Presence of cardiac pacemaker: Secondary | ICD-10-CM

## 2017-11-24 DIAGNOSIS — N183 Chronic kidney disease, stage 3 (moderate): Secondary | ICD-10-CM | POA: Diagnosis present

## 2017-11-24 DIAGNOSIS — H548 Legal blindness, as defined in USA: Secondary | ICD-10-CM | POA: Diagnosis not present

## 2017-11-24 DIAGNOSIS — R634 Abnormal weight loss: Secondary | ICD-10-CM | POA: Diagnosis present

## 2017-11-24 DIAGNOSIS — Z888 Allergy status to other drugs, medicaments and biological substances status: Secondary | ICD-10-CM

## 2017-11-24 DIAGNOSIS — K5730 Diverticulosis of large intestine without perforation or abscess without bleeding: Secondary | ICD-10-CM | POA: Diagnosis present

## 2017-11-24 DIAGNOSIS — I130 Hypertensive heart and chronic kidney disease with heart failure and stage 1 through stage 4 chronic kidney disease, or unspecified chronic kidney disease: Secondary | ICD-10-CM | POA: Diagnosis present

## 2017-11-24 DIAGNOSIS — Z8701 Personal history of pneumonia (recurrent): Secondary | ICD-10-CM | POA: Diagnosis not present

## 2017-11-24 DIAGNOSIS — E872 Acidosis: Secondary | ICD-10-CM | POA: Diagnosis not present

## 2017-11-24 DIAGNOSIS — M19041 Primary osteoarthritis, right hand: Secondary | ICD-10-CM | POA: Diagnosis present

## 2017-11-24 DIAGNOSIS — J181 Lobar pneumonia, unspecified organism: Secondary | ICD-10-CM

## 2017-11-24 DIAGNOSIS — I4892 Unspecified atrial flutter: Secondary | ICD-10-CM | POA: Diagnosis present

## 2017-11-24 DIAGNOSIS — Z9842 Cataract extraction status, left eye: Secondary | ICD-10-CM

## 2017-11-24 DIAGNOSIS — Z8719 Personal history of other diseases of the digestive system: Secondary | ICD-10-CM | POA: Diagnosis not present

## 2017-11-24 DIAGNOSIS — M184 Other bilateral secondary osteoarthritis of first carpometacarpal joints: Secondary | ICD-10-CM | POA: Diagnosis not present

## 2017-11-24 DIAGNOSIS — Z7951 Long term (current) use of inhaled steroids: Secondary | ICD-10-CM

## 2017-11-24 DIAGNOSIS — Z7901 Long term (current) use of anticoagulants: Secondary | ICD-10-CM | POA: Diagnosis not present

## 2017-11-24 DIAGNOSIS — Z85810 Personal history of malignant neoplasm of tongue: Secondary | ICD-10-CM

## 2017-11-24 DIAGNOSIS — Z8673 Personal history of transient ischemic attack (TIA), and cerebral infarction without residual deficits: Secondary | ICD-10-CM

## 2017-11-24 DIAGNOSIS — Z825 Family history of asthma and other chronic lower respiratory diseases: Secondary | ICD-10-CM

## 2017-11-24 DIAGNOSIS — J189 Pneumonia, unspecified organism: Secondary | ICD-10-CM

## 2017-11-24 DIAGNOSIS — I350 Nonrheumatic aortic (valve) stenosis: Secondary | ICD-10-CM | POA: Diagnosis not present

## 2017-11-24 DIAGNOSIS — D631 Anemia in chronic kidney disease: Secondary | ICD-10-CM | POA: Diagnosis not present

## 2017-11-24 DIAGNOSIS — I5032 Chronic diastolic (congestive) heart failure: Secondary | ICD-10-CM | POA: Diagnosis present

## 2017-11-24 DIAGNOSIS — J449 Chronic obstructive pulmonary disease, unspecified: Secondary | ICD-10-CM | POA: Diagnosis present

## 2017-11-24 DIAGNOSIS — K219 Gastro-esophageal reflux disease without esophagitis: Secondary | ICD-10-CM | POA: Diagnosis present

## 2017-11-24 DIAGNOSIS — J690 Pneumonitis due to inhalation of food and vomit: Secondary | ICD-10-CM | POA: Diagnosis present

## 2017-11-24 DIAGNOSIS — J9601 Acute respiratory failure with hypoxia: Secondary | ICD-10-CM | POA: Diagnosis not present

## 2017-11-24 DIAGNOSIS — K589 Irritable bowel syndrome without diarrhea: Secondary | ICD-10-CM | POA: Diagnosis not present

## 2017-11-24 DIAGNOSIS — Z961 Presence of intraocular lens: Secondary | ICD-10-CM | POA: Diagnosis present

## 2017-11-24 DIAGNOSIS — Z808 Family history of malignant neoplasm of other organs or systems: Secondary | ICD-10-CM | POA: Diagnosis not present

## 2017-11-24 DIAGNOSIS — R2681 Unsteadiness on feet: Secondary | ICD-10-CM

## 2017-11-24 DIAGNOSIS — Z66 Do not resuscitate: Secondary | ICD-10-CM | POA: Diagnosis present

## 2017-11-24 DIAGNOSIS — F329 Major depressive disorder, single episode, unspecified: Secondary | ICD-10-CM | POA: Diagnosis not present

## 2017-11-24 DIAGNOSIS — R4020 Unspecified coma: Secondary | ICD-10-CM | POA: Diagnosis not present

## 2017-11-24 DIAGNOSIS — R509 Fever, unspecified: Secondary | ICD-10-CM | POA: Diagnosis not present

## 2017-11-24 DIAGNOSIS — I480 Paroxysmal atrial fibrillation: Secondary | ICD-10-CM | POA: Diagnosis present

## 2017-11-24 LAB — COMPREHENSIVE METABOLIC PANEL
ALT: 26 U/L (ref 0–44)
AST: 66 U/L — AB (ref 15–41)
Albumin: 2.4 g/dL — ABNORMAL LOW (ref 3.5–5.0)
Alkaline Phosphatase: 98 U/L (ref 38–126)
Anion gap: 18 — ABNORMAL HIGH (ref 5–15)
BILIRUBIN TOTAL: 0.8 mg/dL (ref 0.3–1.2)
BUN: 18 mg/dL (ref 8–23)
CALCIUM: 8.4 mg/dL — AB (ref 8.9–10.3)
CO2: 16 mmol/L — ABNORMAL LOW (ref 22–32)
Chloride: 102 mmol/L (ref 98–111)
Creatinine, Ser: 1.9 mg/dL — ABNORMAL HIGH (ref 0.61–1.24)
GFR, EST AFRICAN AMERICAN: 36 mL/min — AB (ref >60)
GFR, EST NON AFRICAN AMERICAN: 31 mL/min — AB (ref >60)
Glucose, Bld: 214 mg/dL — ABNORMAL HIGH (ref 70–99)
Potassium: 5 mmol/L (ref 3.5–5.1)
Sodium: 136 mmol/L (ref 135–145)
TOTAL PROTEIN: 6.2 g/dL — AB (ref 6.5–8.1)

## 2017-11-24 LAB — I-STAT ARTERIAL BLOOD GAS, ED
Acid-base deficit: 4 mmol/L — ABNORMAL HIGH (ref 0.0–2.0)
Bicarbonate: 19.4 mmol/L — ABNORMAL LOW (ref 20.0–28.0)
O2 SAT: 88 %
PCO2 ART: 31.7 mmHg — AB (ref 32.0–48.0)
PH ART: 7.401 (ref 7.350–7.450)
Patient temperature: 101.7
TCO2: 20 mmol/L — ABNORMAL LOW (ref 22–32)
pO2, Arterial: 58 mmHg — ABNORMAL LOW (ref 83.0–108.0)

## 2017-11-24 LAB — PROTIME-INR
INR: 1.54
Prothrombin Time: 18.3 seconds — ABNORMAL HIGH (ref 11.4–15.2)

## 2017-11-24 LAB — URINALYSIS, COMPLETE (UACMP) WITH MICROSCOPIC
BACTERIA UA: NONE SEEN
Bilirubin Urine: NEGATIVE
Glucose, UA: NEGATIVE mg/dL
Hgb urine dipstick: NEGATIVE
Ketones, ur: NEGATIVE mg/dL
Leukocytes, UA: NEGATIVE
Nitrite: NEGATIVE
PROTEIN: NEGATIVE mg/dL
Specific Gravity, Urine: 1.017 (ref 1.005–1.030)
pH: 5 (ref 5.0–8.0)

## 2017-11-24 LAB — MRSA PCR SCREENING: MRSA BY PCR: NEGATIVE

## 2017-11-24 LAB — CBC WITH DIFFERENTIAL/PLATELET
Abs Immature Granulocytes: 0.03 10*3/uL (ref 0.00–0.07)
BASOS PCT: 0 %
Basophils Absolute: 0 10*3/uL (ref 0.0–0.1)
EOS ABS: 0 10*3/uL (ref 0.0–0.5)
EOS PCT: 1 %
HEMATOCRIT: 40.7 % (ref 39.0–52.0)
HEMOGLOBIN: 12 g/dL — AB (ref 13.0–17.0)
IMMATURE GRANULOCYTES: 0 %
Lymphocytes Relative: 30 %
Lymphs Abs: 2.3 10*3/uL (ref 0.7–4.0)
MCH: 26.5 pg (ref 26.0–34.0)
MCHC: 29.5 g/dL — AB (ref 30.0–36.0)
MCV: 89.8 fL (ref 80.0–100.0)
MONO ABS: 0.2 10*3/uL (ref 0.1–1.0)
MONOS PCT: 3 %
Neutro Abs: 5.1 10*3/uL (ref 1.7–7.7)
Neutrophils Relative %: 66 %
Platelets: 255 10*3/uL (ref 150–400)
RBC: 4.53 MIL/uL (ref 4.22–5.81)
RDW: 15 % (ref 11.5–15.5)
WBC: 7.7 10*3/uL (ref 4.0–10.5)
nRBC: 0 % (ref 0.0–0.2)

## 2017-11-24 LAB — BRAIN NATRIURETIC PEPTIDE: B NATRIURETIC PEPTIDE 5: 352.9 pg/mL — AB (ref 0.0–100.0)

## 2017-11-24 LAB — TROPONIN I: Troponin I: 0.09 ng/mL (ref <0.03)

## 2017-11-24 LAB — CBG MONITORING, ED: Glucose-Capillary: 117 mg/dL — ABNORMAL HIGH (ref 70–99)

## 2017-11-24 LAB — I-STAT CG4 LACTIC ACID, ED: LACTIC ACID, VENOUS: 12 mmol/L — AB (ref 0.5–1.9)

## 2017-11-24 LAB — LACTIC ACID, PLASMA
LACTIC ACID, VENOUS: 1.5 mmol/L (ref 0.5–1.9)
Lactic Acid, Venous: 2.3 mmol/L (ref 0.5–1.9)

## 2017-11-24 MED ORDER — SODIUM CHLORIDE 0.9 % IV SOLN
500.0000 mg | INTRAVENOUS | Status: DC
  Administered 2017-11-24: 500 mg via INTRAVENOUS
  Filled 2017-11-24: qty 500

## 2017-11-24 MED ORDER — SODIUM CHLORIDE 0.9 % IV SOLN
500.0000 mg | INTRAVENOUS | Status: DC
  Administered 2017-11-25: 500 mg via INTRAVENOUS
  Filled 2017-11-24 (×2): qty 500

## 2017-11-24 MED ORDER — PANTOPRAZOLE SODIUM 40 MG PO TBEC
40.0000 mg | DELAYED_RELEASE_TABLET | Freq: Every day | ORAL | Status: DC
  Administered 2017-11-25 – 2017-11-27 (×3): 40 mg via ORAL
  Filled 2017-11-24 (×3): qty 1

## 2017-11-24 MED ORDER — SODIUM CHLORIDE 0.9 % IV BOLUS
1000.0000 mL | Freq: Once | INTRAVENOUS | Status: AC
  Administered 2017-11-24: 1000 mL via INTRAVENOUS

## 2017-11-24 MED ORDER — FAMOTIDINE 20 MG PO TABS
20.0000 mg | ORAL_TABLET | Freq: Every day | ORAL | Status: DC
  Administered 2017-11-24 – 2017-11-26 (×3): 20 mg via ORAL
  Filled 2017-11-24 (×3): qty 1

## 2017-11-24 MED ORDER — SODIUM CHLORIDE 0.9 % IV SOLN
2.0000 g | INTRAVENOUS | Status: DC
  Administered 2017-11-24: 2 g via INTRAVENOUS
  Filled 2017-11-24: qty 20

## 2017-11-24 MED ORDER — SODIUM CHLORIDE 0.9 % IV SOLN
INTRAVENOUS | Status: DC
  Administered 2017-11-24: 16:00:00 via INTRAVENOUS

## 2017-11-24 MED ORDER — DILTIAZEM HCL-DEXTROSE 100-5 MG/100ML-% IV SOLN (PREMIX)
5.0000 mg/h | INTRAVENOUS | Status: DC
  Filled 2017-11-24: qty 100

## 2017-11-24 MED ORDER — NAPHAZOLINE-GLYCERIN 0.012-0.2 % OP SOLN
1.0000 [drp] | Freq: Four times a day (QID) | OPHTHALMIC | Status: DC | PRN
  Filled 2017-11-24: qty 15

## 2017-11-24 MED ORDER — DILTIAZEM LOAD VIA INFUSION
15.0000 mg | Freq: Once | INTRAVENOUS | Status: DC
  Filled 2017-11-24: qty 15

## 2017-11-24 MED ORDER — ACETAMINOPHEN 325 MG PO TABS: 650.0000 mg | ORAL_TABLET | ORAL | Status: DC | PRN

## 2017-11-24 MED ORDER — SODIUM CHLORIDE 0.9 % IV SOLN
2.0000 g | INTRAVENOUS | Status: DC
  Administered 2017-11-25: 2 g via INTRAVENOUS
  Filled 2017-11-24 (×2): qty 20

## 2017-11-24 MED ORDER — ONDANSETRON HCL 4 MG/2ML IJ SOLN: 4.0000 mg | Freq: Four times a day (QID) | INTRAMUSCULAR | Status: DC | PRN

## 2017-11-24 MED ORDER — POLYETHYLENE GLYCOL 3350 17 G PO PACK: 17.0000 g | PACK | Freq: Two times a day (BID) | ORAL | Status: DC | PRN

## 2017-11-24 MED ORDER — LACTATED RINGERS IV SOLN
INTRAVENOUS | Status: DC
  Administered 2017-11-24 – 2017-11-25 (×3): via INTRAVENOUS

## 2017-11-24 MED ORDER — APIXABAN 2.5 MG PO TABS
2.5000 mg | ORAL_TABLET | Freq: Two times a day (BID) | ORAL | Status: DC
  Administered 2017-11-24 – 2017-11-27 (×6): 2.5 mg via ORAL
  Filled 2017-11-24 (×6): qty 1

## 2017-11-24 MED ORDER — SENNOSIDES-DOCUSATE SODIUM 8.6-50 MG PO TABS
1.0000 | ORAL_TABLET | Freq: Every day | ORAL | Status: DC
  Administered 2017-11-25 – 2017-11-27 (×3): 1 via ORAL
  Filled 2017-11-24 (×3): qty 1

## 2017-11-24 MED ORDER — GABAPENTIN 300 MG PO CAPS
300.0000 mg | ORAL_CAPSULE | Freq: Every day | ORAL | Status: DC
  Administered 2017-11-25 – 2017-11-27 (×3): 300 mg via ORAL
  Filled 2017-11-24 (×3): qty 1

## 2017-11-24 NOTE — ED Notes (Addendum)
Pt trialing off Bipap. Placed on 4 L O2 at this time. 94% saturation.

## 2017-11-24 NOTE — ED Notes (Signed)
Pt is difficult blood draw, 3 staff members attempting blood draw at this time, including respiratory to obtain abg

## 2017-11-24 NOTE — H&P (Signed)
NAME:  Chad [name], MRN:  [id number], DOB:  04-Nov-1931, LOS: 0 ADMISSION DATE:  11/24/2017, CONSULTATION DATE:  11/24/2017 REFERRING MD:  Alvino Chapel, CHIEF COMPLAINT:  Dysnpea   Brief History   84 y/o male admitted no 11/17 with severe community acquired pneumonia.   History of present illness   84 y/o male with a past medical history significant for coronary artery disease, A. fib, and COPD came to the [facility] emergency department on November 24, 2017 complaining of shortness of breath and cough.  His family provides most of the history because he was confused and on BiPAP.  His wife says that for the last several days he has been feeling more weak, and has had a poor appetite.  He has chronic loose stools which is not changed, she denied any nausea or vomiting or complaints of abdominal pain.  However, she noted increasing weakness and he had 2 falls.  The first was on Friday night, the second was yesterday.  She denied any head trauma.  In the week prior he had been walking with his walker and had been in his usual state of health.  On the morning of November 17 because of increasing shortness of breath and cough with mucus production he asked his wife to move into the emergency room.  She said at first he thought he was choking because she did not think he was that sick but then he insisted so she brought him to the ER.  Here he has been found to be markedly hypoxemic and have labs consistent with sepsis.  He was given IV fluids, antibiotics, and noninvasive mechanical ventilation and pulmonary and critical care medicine was asked to admit.  Past Medical History  COPD, Tongue cancer, atrial flutter, gastroesophageal reflux disease, coronary artery disease, hypertension  Significant Hospital Events   November 17 admitted  Consults:  November 17 pulmonary and critical care medicine  Procedures:    Significant Diagnostic Tests:  November 17 CT head no acute intracranial  process  Micro Data:  November 17 blood culture November 17 urine culture  Antimicrobials:  November 17 ceftriaxone November 17 azithromycin  Interim history/subjective:  As above  Objective   Blood pressure 138/78, pulse (!) 115, temperature (!) 101.5 F (38.6 C), temperature source Rectal, resp. rate (!) 21, height 6\' 1"  (1.854 m), weight 73.9 kg, SpO2 (!) 80 %.       No intake or output data in the 24 hours ending 11/24/17 1657 Filed Weights   11/24/17 1507  Weight: 73.9 kg    Examination:  General:  In bed on BIPAP HENT: NCAT BIPAP mask in place PULM: CTA B, increased work of breathing CV: Irreg irreg, tachycardic, no mgr GI: BS+, soft, nontender MSK: normal bulk and tone Neuro: drowsy, will wake temporarily and answer  Few questions, moves all four extremiites   Resolved Hospital Problem list     Assessment & Plan:  84 year old male with severe sepsis and acute respiratory failure with hypoxemia in the setting of severe community-acquired pneumonia.  Has a baseline history of head and neck cancer, his family notes slow failure to thrive with a 30 pound weight loss over the last year.   Acute respiratory failure with hypoxemia: -BiPAP -Family desires DO NOT RESUSCITATE status -maintain O2 saturation 88 to 92% -Close monitoring in the ICU  Severe community-acquired pneumonia: -Ceftriaxone and azithromycin -Follow-up blood culture results  Acute kidney injury due to severe sepsis: -Continue IV fluid resuscitation at 75 cc an  hour with lactated Ringer's -Monitor BMET and UOP -Replace electrolytes as needed  Severe sepsis, not in septic shock: -Continue IV fluids with lactated Ringer's, based on physical exam does not need further aggressive IV crystalloid boluses  Elevated lactic acid: due to hypoxemia and sepsis, not due to circulatory shock (BP on my exam 138/78) -treat hypoxemia -repeat lactic acid  Atrial fibrillation with RVR  -diltiazem  infusion -continue Eliquis  GERD -continue home PPI/H2 blocker  Code status: I had a discussion with the patient's wife and son in the emergency room regarding the severity of his illness.  I advised that given the severity of the patient's illness we would typically consider intubation and mechanical ventilation.  However, they say that he has had trouble with his throat for years with throat cancer and they understand that he is frail and has become weaker over the last year.  They prefer for [place] to maintain noninvasive mechanical ventilation, IV fluids, and IV antibiotics.  They understand that if his condition worsens we will not perform CPR in the event of cardiac arrest, will not intubated if he has respiratory failure, and we will make him comfortable.   Best practice:  Diet: NPO Pain/Anxiety/Delirium protocol (if indicated): no VAP protocol (if indicated): none DVT prophylaxis: Eliquis GI prophylaxis: n/a Glucose control: monitor glucose with labs Mobility: PT consult on 11/18 if improve Code Status: DNR, BIPAP OK Family Communication: updated wife, son bedside Disposition: admit to ICU  Labs   CBC: Recent Labs  Lab 11/24/17 1515  WBC 7.7  NEUTROABS 5.1  HGB 12.0*  HCT 40.7  MCV 89.8  PLT 244    Basic Metabolic Panel: Recent Labs  Lab 11/24/17 1515  NA 136  K 5.0  CL 102  CO2 16*  GLUCOSE 214*  BUN 18  CREATININE 1.90*  CALCIUM 8.4*   GFR: Estimated Creatinine Clearance: 30.3 mL/min (A) (by C-G formula based on SCr of 1.9 mg/dL (H)). Recent Labs  Lab 11/24/17 1515 11/24/17 1549  WBC 7.7  --   LATICACIDVEN  --  12.00*    Liver Function Tests: Recent Labs  Lab 11/24/17 1515  AST 66*  ALT 26  ALKPHOS 98  BILITOT 0.8  PROT 6.2*  ALBUMIN 2.4*   No results for input(s): LIPASE, AMYLASE in the last 168 hours. No results for input(s): AMMONIA in the last 168 hours.  ABG    Component Value Date/Time   PHART 7.401 11/24/2017 1631   PCO2ART 31.7  (L) 11/24/2017 1631   PO2ART 58.0 (L) 11/24/2017 1631   HCO3 19.4 (L) 11/24/2017 1631   TCO2 20 (L) 11/24/2017 1631   ACIDBASEDEF 4.0 (H) 11/24/2017 1631   O2SAT 88.0 11/24/2017 1631     Coagulation Profile: Recent Labs  Lab 11/24/17 1515  INR 1.54    Cardiac Enzymes: No results for input(s): CKTOTAL, CKMB, CKMBINDEX, TROPONINI in the last 168 hours.  HbA1C: Hgb A1c MFr Bld  Date/Time Value Ref Range Status  06/04/2017 06:48 AM 5.6 4.8 - 5.6 % Final    Comment:    (NOTE) Pre diabetes:          5.7%-6.4% Diabetes:              >6.4% Glycemic control for   <7.0% adults with diabetes   01/12/2013 07:20 PM 5.6 <5.7 % Final    Comment:    (NOTE)  According to the ADA Clinical Practice Recommendations for 2011, when HbA1c is used as a screening test:  >=6.5%   Diagnostic of Diabetes Mellitus           (if abnormal result is confirmed) 5.7-6.4%   Increased risk of developing Diabetes Mellitus References:Diagnosis and Classification of Diabetes Mellitus,Diabetes IOEV,0350,09(FGHWE 1):S62-S69 and Standards of Medical Care in         Diabetes - 2011,Diabetes XHBZ,1696,78 (Suppl 1):S11-S61.    CBG: Recent Labs  Lab 11/24/17 1537  GLUCAP 117*    Review of Systems:   Cannot obtain due to noninvasive mechanical ventilation and confusion  Past Medical History  He,  has a past medical history of Arthritis, Atrial flutter ([place]), Carotid artery calcification, Colon polyps (06/26/2005), COPD (chronic obstructive pulmonary disease) ([place]), Diverticulosis of colon (without mention of hemorrhage), Gastritis, chronic, GERD (gastroesophageal reflux disease), Hypertension, Hyponatremia, IBS (irritable bowel syndrome), Internal and external hemorrhoids without complication, Macular degeneration of left eye, Pneumonia, Stroke ([place]), and Tongue cancer ([place]) (02/1997).   Surgical History    Past Surgical History:   Procedure Laterality Date  . ATRIAL FLUTTER ABLATION N/A 07/08/2012   Procedure: ATRIAL FLUTTER ABLATION;  Surgeon: Thompson Grayer, MD;  Location: [facility] CATH LAB;  Service: Cardiovascular;  Laterality: N/A;  . CARDIAC CATHETERIZATION  04/2012  . CATARACT EXTRACTION W/ INTRAOCULAR LENS IMPLANT Left   . COLONOSCOPY  08/29/10   diverticulosis, internal hemorrhoids  . ESOPHAGOGASTRODUODENOSCOPY  09/20/10   normal  . [place]  . LEFT HEART CATHETERIZATION WITH CORONARY ANGIOGRAM N/A 05/06/2012   Procedure: LEFT HEART CATHETERIZATION WITH CORONARY ANGIOGRAM;  Surgeon: Peter M Martinique, MD;  Location: [facility] CATH LAB;  Service: Cardiovascular;  Laterality: N/A;  . 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 PACEMAKER IMPLANT N/A 08/01/2017   Procedure: PACEMAKER IMPLANT;  Surgeon: Constance Haw, MD;  Location: [facility] CV LAB;  Service: Cardiovascular;  Laterality: N/A;  . TRIGGER FINGER RELEASE Right 1970's   "2 fingers"  . WART FULGURATION Left 09/15/2015   Procedure: EXCISIONal biospy of left peri anual and anual canal mass;  Surgeon: Greer Pickerel, MD;  Location: [facility] ORS;  Service: General;  Laterality: Left;     Social History   reports that he quit smoking about 20 years ago. His smoking use included cigarettes, pipe, and cigars. He has a 10.00 pack-year smoking history. He has never used smokeless tobacco. He reports that he does not drink alcohol or use drugs.   Family History   His family history includes Diabetes in his unknown relative; Emphysema in his sister and sister; Heart disease in his brother; Other in his brother, father, and mother; Throat cancer in his brother.   Allergies Allergies  Allergen Reactions  . Dyazide [Hydrochlorothiazide W-Triamterene] Other (See Comments)    Caused hyponatremia 04/2012  . Lisinopril Other (See Comments)    cough     Home Medications  Prior to Admission medications   Medication Sig Start Date End Date Taking?  Authorizing Provider  amLODipine (NORVASC) 2.5 MG tablet Take 1 tablet (2.5 mg total) by mouth daily. 08/14/17  Yes Lelon Perla, MD  apixaban (ELIQUIS) 2.5 MG TABS tablet Take 1 tablet (2.5 mg total) by mouth 2 (two) times daily. Resume 7/28/19PM 08/07/17  Yes Lelon Perla, MD  atorvastatin (LIPITOR) 40 MG tablet Take 1 tablet (40 mg total) by mouth daily at 6 PM. 08/21/17  Yes Plotnikov, Evie Lacks, MD  cholecalciferol (VITAMIN D) 1000  units tablet Take 1 tablet (1,000 Units total) by mouth daily. 10/15/16  Yes Plotnikov, Evie Lacks, MD  clidinium-chlordiazePOXIDE (LIBRAX) 5-2.5 MG capsule Take 1 capsule by mouth 2 (two) times daily. 08/21/17  Yes Plotnikov, Evie Lacks, MD  cyanocobalamin 1000 MCG tablet Take 1,000 mcg by mouth daily.    Yes [provider]  famotidine (PEPCID) 20 MG tablet Take 1 tablet (20 mg total) by mouth at bedtime. 08/14/17  Yes Tanda Rockers, MD  fluticasone (FLONASE) 50 MCG/ACT nasal spray USE 2 SPRAYS IN EACH NOSTRIL DAILY Patient taking differently: Place 2 sprays into both nostrils as needed for allergies.  08/15/17  Yes Plotnikov, Evie Lacks, MD  gabapentin (NEURONTIN) 300 MG capsule Take 3 capsules at bedtime. Patient taking differently: Take 900 mg by mouth at bedtime.  11/15/17  Yes Plotnikov, Evie Lacks, MD  ipratropium (ATROVENT) 0.02 % nebulizer solution Take 2.5 mLs (0.5 mg total) by nebulization 3 (three) times daily. Via [facility] 10/16/17  Yes Plotnikov, Evie Lacks, MD  multivitamin-lutein (OCUVITE-LUTEIN) CAPS capsule Take 1 capsule by mouth daily.    Yes [provider]  pantoprazole (PROTONIX) 40 MG tablet Take 1 tablet (40 mg total) by mouth daily. Take 30-60 min before first meal of the day 08/14/17  Yes Wert, Christena Deem, MD  polyethylene glycol powder (GLYCOLAX/MIRALAX) powder Take 17 g by mouth 2 (two) times daily as needed. Patient taking differently: Take 17 g by mouth 2 (two) times daily as needed for mild constipation.  03/14/16  Yes Wardell Honour, MD  senna-docusate (SENOKOT-S) 8.6-50 MG tablet Take 1 tablet by mouth daily.   Yes [provider]  tetrahydrozoline 0.05 % ophthalmic solution Place 1 drop into both eyes as needed (dry eyes).   Yes [provider]  traMADol (ULTRAM) 50 MG tablet Take 1 tablet (50 mg total) by mouth every 12 (twelve) hours as needed. Patient not taking: Reported on 11/24/2017 10/22/17   Davonna Belling, MD     Critical care time: 60 minutes     Roselie Awkward, MD [place] Pager: [phone] Cell: [phone] If no response, call [phone]

## 2017-11-24 NOTE — ED Provider Notes (Signed)
MSE was initiated and I personally evaluated the patient and placed orders (if any) at  2:49 PM on November 24, 2017.  The patient appears stable so that the remainder of the MSE may be completed by another provider.  Patient here with his wife due to concern of altered mental status, fever. Onset was yesterday, about 28 hours ago.  Since that time the patient has been less interactive than usual, with increasing weakness, ongoing cough. Patient himself does not respond to questions appropriately, wife notes that this is typical for him during illness. She acknowledges multiple medical issues, states that he has taken all medication as directed.    Carmin Muskrat, MD 11/24/17 1450

## 2017-11-24 NOTE — ED Notes (Signed)
Patient transported to CT 

## 2017-11-24 NOTE — ED Triage Notes (Signed)
Pt arrives to ED with complaints of frequent and burning urination, weakness, tremors, and fever. Pt oriented and alert. Pt placed in position of comfort with call bell in reach, family bedside thinks he has had a stroke.

## 2017-11-24 NOTE — ED Notes (Signed)
I [place] Acid results of 12.00 reported to: Dr. Vanita Panda, Dr. Alvino Chapel, Maia Petties RN.

## 2017-11-24 NOTE — ED Notes (Signed)
Not enough urine collected for a culture

## 2017-11-24 NOTE — ED Notes (Signed)
Pt placed on Bipap by respiratory. Having difficulty obtaining accurate pulse ox, reading 80% on ear lobe.

## 2017-11-24 NOTE — ED Provider Notes (Signed)
[facility] Provider Note   CSN: [id number] Arrival date & time: 11/24/17  1436     History   Chief Complaint Chief Complaint  Patient presents with  . Code Sepsis    HPI Chad [name] is a 84 y.o. male. Left 5 caveat due to some confusion.   HPI Patient brought in for altered mental status and fever.  Been somewhat confused since yesterday.  Has had a cough with some sputum production.  Patient states no dysuria but nurse states that the wife and said he has had dysuria.  Does have history of COPD and atrial flutter.  He is on anticoagulation.  Discussed with patient's wife and they have not had CODE STATUS versus intubation discussions. Past Medical History:  Diagnosis Date  . Arthritis    "hands" (01/12/2013); feet (podiatry consultation); "back" (06/12/2017)  . Atrial flutter ([place])    a. Slow ~100bpm when in 2:1; dx 04/2012. b. Placed on Xarelto.  c. s/p ablation 07-08-2012 by Dr Rayann Heman  . Carotid artery calcification    By CXR - dopplers 05/06/12 without obvious evidence of significant ICA stenosis >40%  . Colon polyps 06/26/2005  . COPD (chronic obstructive pulmonary disease) ([place])   . Diverticulosis of colon (without mention of hemorrhage)   . Gastritis, chronic    Pt denies bleeding 04/2012. EGD 2012 reportedly normal.  . GERD (gastroesophageal reflux disease)   . Hypertension   . Hyponatremia    04/2012 r/t diuretic.  . IBS (irritable bowel syndrome)   . Internal and external hemorrhoids without complication   . Macular degeneration of left eye   . Pneumonia    "@ least 5 times" (06/12/2017)  . Stroke ([place])   . Tongue cancer ([place]) 02/1997    Patient Active Problem List   Diagnosis Date Noted  . Legal blindness 10/15/2017  . Blood per rectum 09/13/2017  . Weight loss 09/13/2017  . Syncope 08/01/2017  . Wart viral 06/17/2017  . Bradycardia   . Calcium channel blocker overdose   . Back pain 06/11/2017  . Symptomatic  bradycardia 06/11/2017  . Hypertension 06/11/2017  . 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 History of CVA (cerebrovascular accident)-May 2019 06/11/2017  . Acute hyponatremia 06/11/2017  . Chronic kidney disease (CKD), stage IV (severe) ([place]) 06/11/2017  . Chronic diastolic heart failure ([place]) 06/11/2017  . HLD (hyperlipidemia) 06/11/2017  . Paroxysmal atrial flutter ([place]) 06/11/2017  . Cerebral embolism with cerebral infarction 06/04/2017  . CVA (cerebral vascular accident) ([place]) 06/04/2017  . Stroke-like symptoms 06/03/2017  . Bronchiectasis without complication ([place]) 52/84/1324  . DOE (dyspnea on exertion) 04/24/2017  . Cough in adult 02/13/2017  . Flatulence 02/13/2017  . Hemoptysis 06/12/2016  . Creatinine elevation 06/12/2016  . Eyebrow laceration, right, sequela 05/18/2016  . Abnormal TSH 05/18/2016  . Well adult exam 04/20/2016  . Enlarged prostate 04/20/2016  . Community acquired pneumonia   . Pneumonia of both lungs due to infectious organism 03/02/2016  . 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 Anal lesion 06/07/2015  . Gas 01/18/2015  . Aortic stenosis 06/08/2013  . Unsteadiness 02/11/2013  . Ectopic atrial tachycardia ([place]) 01/12/2013  . Atrial tachycardia ([place]) 01/12/2013  . Atrial flutter ([place]) 05/06/2012  . Aortic valve sclerosis 05/06/2012  . Acute renal failure ([place]) 05/05/2012  . 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 Anemia 07/27/2010  . Essential hypertension 11/15/2008  . COPD (  chronic obstructive pulmonary disease) with chronic bronchitis ([place]) 11/15/2008  . GERD 11/15/2008  . IBS 11/15/2008  . Midsternal chest pain 11/15/2008    Past Surgical History:  Procedure Laterality Date  . ATRIAL FLUTTER ABLATION N/A 07/08/2012   Procedure: ATRIAL FLUTTER ABLATION;  Surgeon: Thompson Grayer, MD;  Location: [facility] CATH LAB;  Service: Cardiovascular;   Laterality: N/A;  . CARDIAC CATHETERIZATION  04/2012  . CATARACT EXTRACTION W/ INTRAOCULAR LENS IMPLANT Left   . COLONOSCOPY  08/29/10   diverticulosis, internal hemorrhoids  . ESOPHAGOGASTRODUODENOSCOPY  09/20/10   normal  . [place]  . LEFT HEART CATHETERIZATION WITH CORONARY ANGIOGRAM N/A 05/06/2012   Procedure: LEFT HEART CATHETERIZATION WITH CORONARY ANGIOGRAM;  Surgeon: Peter M Martinique, MD;  Location: [facility] CATH LAB;  Service: Cardiovascular;  Laterality: N/A;  . 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 PACEMAKER IMPLANT N/A 08/01/2017   Procedure: PACEMAKER IMPLANT;  Surgeon: Constance Haw, MD;  Location: [facility] CV LAB;  Service: Cardiovascular;  Laterality: N/A;  . TRIGGER FINGER RELEASE Right 1970's   "2 fingers"  . WART FULGURATION Left 09/15/2015   Procedure: EXCISIONal biospy of left peri anual and anual canal mass;  Surgeon: Greer Pickerel, MD;  Location: [facility] ORS;  Service: General;  Laterality: Left;        Home Medications    Prior to Admission medications   Medication Sig Start Date End Date Taking? Authorizing Provider  amLODipine (NORVASC) 2.5 MG tablet Take 1 tablet (2.5 mg total) by mouth daily. 08/14/17  Yes Lelon Perla, MD  apixaban (ELIQUIS) 2.5 MG TABS tablet Take 1 tablet (2.5 mg total) by mouth 2 (two) times daily. Resume 7/28/19PM 08/07/17  Yes Lelon Perla, MD  atorvastatin (LIPITOR) 40 MG tablet Take 1 tablet (40 mg total) by mouth daily at 6 PM. 08/21/17  Yes Plotnikov, Evie Lacks, MD  cholecalciferol (VITAMIN D) 1000 units tablet Take 1 tablet (1,000 Units total) by mouth daily. 10/15/16  Yes Plotnikov, Evie Lacks, MD  clidinium-chlordiazePOXIDE (LIBRAX) 5-2.5 MG capsule Take 1 capsule by mouth 2 (two) times daily. 08/21/17  Yes Plotnikov, Evie Lacks, MD  cyanocobalamin 1000 MCG tablet Take 1,000 mcg by mouth daily.    Yes [provider]  famotidine (PEPCID) 20 MG tablet Take 1 tablet (20 mg total) by mouth at  bedtime. 08/14/17  Yes Tanda Rockers, MD  fluticasone (FLONASE) 50 MCG/ACT nasal spray USE 2 SPRAYS IN EACH NOSTRIL DAILY Patient taking differently: Place 2 sprays into both nostrils as needed for allergies.  08/15/17  Yes Plotnikov, Evie Lacks, MD  gabapentin (NEURONTIN) 300 MG capsule Take 3 capsules at bedtime. Patient taking differently: Take 900 mg by mouth at bedtime.  11/15/17  Yes Plotnikov, Evie Lacks, MD  ipratropium (ATROVENT) 0.02 % nebulizer solution Take 2.5 mLs (0.5 mg total) by nebulization 3 (three) times daily. [facility] 10/16/17  Yes Plotnikov, Evie Lacks, MD  multivitamin-lutein (OCUVITE-LUTEIN) CAPS capsule Take 1 capsule by mouth daily.    Yes [provider]  pantoprazole (PROTONIX) 40 MG tablet Take 1 tablet (40 mg total) by mouth daily. Take 30-60 min before first meal of the day 08/14/17  Yes Wert, Christena Deem, MD  polyethylene glycol powder (GLYCOLAX/MIRALAX) powder Take 17 g by mouth 2 (two) times daily as needed. Patient taking differently: Take 17 g by mouth 2 (two) times daily as needed for mild constipation.  03/14/16  Yes Wardell Honour, MD  senna-docusate (SENOKOT-S) 8.6-50  MG tablet Take 1 tablet by mouth daily.   Yes [provider]  tetrahydrozoline 0.05 % ophthalmic solution Place 1 drop into both eyes as needed (dry eyes).   Yes [provider]  traMADol (ULTRAM) 50 MG tablet Take 1 tablet (50 mg total) by mouth every 12 (twelve) hours as needed. Patient not taking: Reported on 11/24/2017 10/22/17   Davonna Belling, MD    Family History Family History  Problem Relation Age of Onset  . Heart disease Brother   . Throat cancer Brother   . Other Mother        UNSURE  . Other Father        UNSURE  . Emphysema Sister   . Emphysema Sister   . Other Brother        UNSURE  . Diabetes Unknown     Social History Social History   Tobacco Use  . Smoking status: Former Smoker    Packs/day: 0.50    Years: 20.00    Pack years: 10.00     Types: Cigarettes, Pipe, Cigars    Last attempt to quit: 12/08/1996    Years since quitting: 20.9  . Smokeless tobacco: Never Used  . Tobacco comment:    Substance Use Topics  . Alcohol use: No    Alcohol/week: 0.0 standard drinks    Frequency: Never    Comment: last since 1998  . Drug use: Never     Allergies   Dyazide [hydrochlorothiazide w-triamterene] and Lisinopril   Review of Systems Review of Systems  Unable to perform ROS: Mental status change     Physical Exam Updated Vital Signs BP 138/78   Pulse (!) 115   Temp (!) 101.5 F (38.6 C) (Rectal)   Resp (!) 21   Ht 6\' 1"  (1.854 m)   Wt 73.9 kg   SpO2 (!) 80%   BMI 21.49 kg/m   Physical Exam  Constitutional: He appears well-developed.  HENT:  Head: Atraumatic.  Eyes: Pupils are equal, round, and reactive to light.  Neck: Neck supple.  Cardiovascular:  Tachycardia  Pulmonary/Chest:  Diffuse harsh breath sounds.  Abdominal: He exhibits no mass. There is no tenderness.  Musculoskeletal: He exhibits no edema.  Neurological: He is alert.  Alert but some confusion.  Skin: Capillary refill takes more than 3 seconds.     ED Treatments / Results  Labs (all labs ordered are listed, but only abnormal results are displayed) Labs Reviewed  COMPREHENSIVE METABOLIC PANEL - Abnormal; Notable for the following components:      Result Value   CO2 16 (*)    Glucose, Bld 214 (*)    Creatinine, Ser 1.90 (*)    Calcium 8.4 (*)    Total Protein 6.2 (*)    Albumin 2.4 (*)    AST 66 (*)    GFR calc non Af Amer 31 (*)    GFR calc Af Amer 36 (*)    Anion gap 18 (*)    All other components within normal limits  CBC WITH DIFFERENTIAL/PLATELET - Abnormal; Notable for the following components:   Hemoglobin 12.0 (*)    MCHC 29.5 (*)    All other components within normal limits  PROTIME-INR - Abnormal; Notable for the following components:   Prothrombin Time 18.3 (*)    All other components within normal limits  CBG  MONITORING, ED - Abnormal; Notable for the following components:   Glucose-Capillary 117 (*)    All other components within normal limits  I-STAT CG4 LACTIC ACID, ED - Abnormal; Notable for the following components:   Lactic Acid, Venous 12.00 (*)    All other components within normal limits  I-STAT ARTERIAL BLOOD GAS, ED - Abnormal; Notable for the following components:   pCO2 arterial 31.7 (*)    pO2, Arterial 58.0 (*)    Bicarbonate 19.4 (*)    TCO2 20 (*)    Acid-base deficit 4.0 (*)    All other components within normal limits  URINE CULTURE  CULTURE, BLOOD (ROUTINE X 2)  CULTURE, BLOOD (ROUTINE X 2)  URINALYSIS, COMPLETE (UACMP) WITH MICROSCOPIC    EKG EKG Interpretation  Date/Time:  Sunday November 24 2017 14:59:52 EST Ventricular Rate:  116 PR Interval:    QRS Duration: 115 QT Interval:  415 QTC Calculation: 577 R Axis:   -72 Text Interpretation:  Atrial fibrillation Left anterior fascicular block Probable left ventricular hypertrophy ST-t wave abnormality Artifact Abnormal ekg Confirmed by Carmin Muskrat [phone]) on 11/24/2017 3:07:40 PM   Radiology Ct Head Wo Contrast  Result Date: 11/24/2017 CLINICAL DATA:  Altered mental status, fever, generalized weakness EXAM: CT HEAD WITHOUT CONTRAST TECHNIQUE: Contiguous axial images were obtained from the base of the skull through the vertex without intravenous contrast. COMPARISON:  08/01/2016 FINDINGS: Brain: No evidence of acute infarction, hemorrhage, hydrocephalus, extra-axial collection or mass lesion/mass effect. Encephalomalacic changes in the medial right frontal lobe. Subcortical white matter and periventricular small vessel ischemic changes. Vascular: Intracranial atherosclerosis. Skull: Normal. Negative for fracture or focal lesion. Sinuses/Orbits: The visualized paranasal sinuses are essentially clear. The mastoid air cells are unopacified. Other: None. IMPRESSION: No evidence of acute intracranial abnormality.  Encephalomalacic changes in the medial right frontal lobe. Small vessel ischemic changes. Electronically Signed   By: Julian Hy M.D.   On: 11/24/2017 15:42   Dg Chest Port 1 View  Result Date: 11/24/2017 CLINICAL DATA:  Altered level of consciousness EXAM: PORTABLE CHEST 1 VIEW COMPARISON:  08/14/2017 FINDINGS: Mild patchy bilateral lower lung opacities, suspicious for multifocal pneumonia, possibly on the basis of aspiration. No pleural effusion or pneumothorax. Biapical pleural-parenchymal scarring. The heart is normal in size. Left subclavian pacemaker. IMPRESSION: Mild patchy bilateral lower lung opacities, suspicious for multifocal pneumonia, possibly on the basis of aspiration. Electronically Signed   By: Julian Hy M.D.   On: 11/24/2017 15:39    Procedures Procedures (including critical care time)  Medications Ordered in ED Medications  sodium chloride 0.9 % bolus 1,000 mL (1,000 mLs Intravenous New Bag/Given 11/24/17 1554)    And  0.9 %  sodium chloride infusion ( Intravenous New Bag/Given 11/24/17 1608)  cefTRIAXone (ROCEPHIN) 2 g in sodium chloride 0.9 % 100 mL IVPB (2 g Intravenous New Bag/Given 11/24/17 1630)  azithromycin (ZITHROMAX) 500 mg in sodium chloride 0.9 % 250 mL IVPB (has no administration in time range)     Initial Impression / Assessment and Plan / ED Course  I have reviewed the triage vital signs and the nursing notes.  Pertinent labs & imaging results that were available during my care of the patient were reviewed by me and considered in my medical decision making (see chart for details).     Patient presents with fever and mental status change.  Multifocal pneumonia on x-ray.  Has hypoxia.  Delayed capillary refill.  Code sepsis called by me.  Although I thought I had initially been called since the chief complaint was a code sepsis.  Lactic acid of 12.  Started on BiPAP.  Started on  antibiotics and fluid bolus given.  Dr. Lake Bells from ICU will  admit the patient.  Discussed with the patient's wife and they had not had CODE STATUS or intubation discussions previously.  CRITICAL CARE Performed by: Davonna Belling Total critical care time: 30 minutes Critical care time was exclusive of separately billable procedures and treating other patients. Critical care was necessary to treat or prevent imminent or life-threatening deterioration. Critical care was time spent personally by me on the following activities: development of treatment plan with patient and/or surrogate as well as nursing, discussions with consultants, evaluation of patient's response to treatment, examination of patient, obtaining history from patient or surrogate, ordering and performing treatments and interventions, ordering and review of laboratory studies, ordering and review of radiographic studies, pulse oximetry and re-evaluation of patient's condition.   Final Clinical Impressions(s) / ED Diagnoses   Final diagnoses:  Severe sepsis [facility])  Multifocal pneumonia    ED Discharge Orders    None       Davonna Belling, MD 11/24/17 1711

## 2017-11-25 ENCOUNTER — Encounter: Payer: Self-pay | Admitting: Internal Medicine

## 2017-11-25 DIAGNOSIS — J449 Chronic obstructive pulmonary disease, unspecified: Secondary | ICD-10-CM

## 2017-11-25 DIAGNOSIS — K589 Irritable bowel syndrome without diarrhea: Secondary | ICD-10-CM

## 2017-11-25 DIAGNOSIS — K219 Gastro-esophageal reflux disease without esophagitis: Secondary | ICD-10-CM

## 2017-11-25 DIAGNOSIS — M184 Other bilateral secondary osteoarthritis of first carpometacarpal joints: Secondary | ICD-10-CM

## 2017-11-25 DIAGNOSIS — Z87891 Personal history of nicotine dependence: Secondary | ICD-10-CM

## 2017-11-25 DIAGNOSIS — D631 Anemia in chronic kidney disease: Secondary | ICD-10-CM

## 2017-11-25 DIAGNOSIS — Z7901 Long term (current) use of anticoagulants: Secondary | ICD-10-CM

## 2017-11-25 DIAGNOSIS — H548 Legal blindness, as defined in USA: Secondary | ICD-10-CM

## 2017-11-25 DIAGNOSIS — Z8673 Personal history of transient ischemic attack (TIA), and cerebral infarction without residual deficits: Secondary | ICD-10-CM

## 2017-11-25 DIAGNOSIS — Z8701 Personal history of pneumonia (recurrent): Secondary | ICD-10-CM

## 2017-11-25 DIAGNOSIS — I5032 Chronic diastolic (congestive) heart failure: Secondary | ICD-10-CM

## 2017-11-25 DIAGNOSIS — I350 Nonrheumatic aortic (valve) stenosis: Secondary | ICD-10-CM

## 2017-11-25 DIAGNOSIS — F329 Major depressive disorder, single episode, unspecified: Secondary | ICD-10-CM | POA: Diagnosis not present

## 2017-11-25 DIAGNOSIS — I130 Hypertensive heart and chronic kidney disease with heart failure and stage 1 through stage 4 chronic kidney disease, or unspecified chronic kidney disease: Secondary | ICD-10-CM

## 2017-11-25 LAB — CBC
HEMATOCRIT: 30.6 % — AB (ref 39.0–52.0)
HEMOGLOBIN: 10 g/dL — AB (ref 13.0–17.0)
MCH: 28 pg (ref 26.0–34.0)
MCHC: 32.7 g/dL (ref 30.0–36.0)
MCV: 85.7 fL (ref 80.0–100.0)
NRBC: 0 % (ref 0.0–0.2)
Platelets: 174 10*3/uL (ref 150–400)
RBC: 3.57 MIL/uL — AB (ref 4.22–5.81)
RDW: 14.7 % (ref 11.5–15.5)
WBC: 18.4 10*3/uL — ABNORMAL HIGH (ref 4.0–10.5)

## 2017-11-25 LAB — URINE CULTURE
Culture: <10000 — AB
SPECIAL REQUESTS: NORMAL

## 2017-11-25 LAB — BASIC METABOLIC PANEL
Anion gap: 5 (ref 5–15)
BUN: 20 mg/dL (ref 8–23)
CO2: 26 mmol/L (ref 22–32)
Calcium: 7.7 mg/dL — ABNORMAL LOW (ref 8.9–10.3)
Chloride: 106 mmol/L (ref 98–111)
Creatinine, Ser: 1.53 mg/dL — ABNORMAL HIGH (ref 0.61–1.24)
GFR calc Af Amer: 46 mL/min — ABNORMAL LOW (ref >60)
GFR calc non Af Amer: 40 mL/min — ABNORMAL LOW (ref >60)
GLUCOSE: 79 mg/dL (ref 70–99)
POTASSIUM: 4.2 mmol/L (ref 3.5–5.1)
Sodium: 137 mmol/L (ref 135–145)

## 2017-11-25 LAB — TROPONIN I
Troponin I: 0.07 ng/mL (ref <0.03)
Troponin I: 0.06 ng/mL (ref <0.03)

## 2017-11-25 MED ORDER — INFLUENZA VAC SPLIT HIGH-DOSE 0.5 ML IM SUSY: 0.5000 mL | PREFILLED_SYRINGE | INTRAMUSCULAR | Status: DC | PRN

## 2017-11-25 NOTE — Assessment & Plan Note (Signed)
Wt Readings from Last 3 Encounters:  11/25/17 164 lb 10.9 oz (74.7 kg)  11/15/17 163 lb (73.9 kg)  11/05/17 166 lb (75.3 kg)

## 2017-11-25 NOTE — Evaluation (Signed)
Clinical/Bedside Swallow Evaluation Patient Details  Name: Chad [name] MRN: [id number] Date of Birth: 12-11-1931  Today's Date: 11/25/2017 Time: SLP Start Time (ACUTE ONLY): 1522 SLP Stop Time (ACUTE ONLY): 1541 SLP Time Calculation (min) (ACUTE ONLY): 19 min  Past Medical History:  Past Medical History:  Diagnosis Date  . Arthritis    "hands" (01/12/2013); feet (podiatry consultation); "back" (06/12/2017)  . Atrial flutter ([place])    a. Slow ~100bpm when in 2:1; dx 04/2012. b. Placed on Xarelto.  c. s/p ablation 07-08-2012 by Dr Rayann Heman  . Carotid artery calcification    By CXR - dopplers 05/06/12 without obvious evidence of significant ICA stenosis >40%  . Colon polyps 06/26/2005  . COPD (chronic obstructive pulmonary disease) ([place])   . Diverticulosis of colon (without mention of hemorrhage)   . Gastritis, chronic    Pt denies bleeding 04/2012. EGD 2012 reportedly normal.  . GERD (gastroesophageal reflux disease)   . Hypertension   . Hyponatremia    04/2012 r/t diuretic.  . IBS (irritable bowel syndrome)   . Internal and external hemorrhoids without complication   . Macular degeneration of left eye   . Pneumonia    "@ least 5 times" (06/12/2017)  . Stroke ([place])   . Tongue cancer ([place]) 02/1997   Past Surgical History:  Past Surgical History:  Procedure Laterality Date  . ATRIAL FLUTTER ABLATION N/A 07/08/2012   Procedure: ATRIAL FLUTTER ABLATION;  Surgeon: Thompson Grayer, MD;  Location: [facility] CATH LAB;  Service: Cardiovascular;  Laterality: N/A;  . CARDIAC CATHETERIZATION  04/2012  . CATARACT EXTRACTION W/ INTRAOCULAR LENS IMPLANT Left   . COLONOSCOPY  08/29/10   diverticulosis, internal hemorrhoids  . ESOPHAGOGASTRODUODENOSCOPY  09/20/10   normal  . [place]  . LEFT HEART CATHETERIZATION WITH CORONARY ANGIOGRAM N/A 05/06/2012   Procedure: LEFT HEART CATHETERIZATION WITH CORONARY ANGIOGRAM;  Surgeon: Peter M Martinique, MD;  Location: [facility] CATH LAB;  Service: Cardiovascular;   Laterality: N/A;  . 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 PACEMAKER IMPLANT N/A 08/01/2017   Procedure: PACEMAKER IMPLANT;  Surgeon: Constance Haw, MD;  Location: [facility] CV LAB;  Service: Cardiovascular;  Laterality: N/A;  . TRIGGER FINGER RELEASE Right 1970's   "2 fingers"  . WART FULGURATION Left 09/15/2015   Procedure: EXCISIONal biospy of left peri anual and anual canal mass;  Surgeon: Greer Pickerel, MD;  Location: [facility] ORS;  Service: General;  Laterality: Left;   HPI:  Pt is a 84 y.o. male with a PMH significant for CAD, A. fib, HTN, COPD, GERD, pneumonia ("at least 5 times" 06/12/2017), stroke, and tongue cancer. He [facility] ED from home on 11/24/17 with SOB and cough. Per wife, pt has had poor appetite, increasing weakness over last several days, and 2 recent falls (1 fall 11/15; another on 11/16). Head CT revealed no acute intracranial abnormality, but encephalomalacic changes in medial R frontal lobe. CXR showed mild patchy bilateral lower lung opacities, suspicious for multifocal pneumonia.   Assessment / Plan / Recommendation Clinical Impression  Pt was alert and attentive throughout bedside swallow evaluation today. He denied difficulty swallowing prior to this hospital admission, however his son at bedside reported pt frequently coughs both at baseline and during PO intake. Oral motor exam revealed reduced lingual ROM and small resection of L tongue blade (secondary to tongue cancer 1998 - denied radiation therapy). Pt completed 3 oz water test, multiple subsequent sips of thin, and bites of puree  without overt s/s aspiration. However, given pt's increased risk factors including COPD, noteworthy recurrent pneumonia, and current concern for aspiration pneumonia recommend pt continue NPO until instrumental MBS exam can be completed tomorrow. Pt may have sips of thin H2O only with full supervision and oral care before PO. SLP Visit Diagnosis: Dysphagia,  unspecified (R13.10)    Aspiration Risk  Moderate aspiration risk    Diet Recommendation Other (Comment)(may have sips of thin with full supervision and oral care)   Liquid Administration via: Cup    Other  Recommendations Oral Care Recommendations: Oral care QID   Follow up Recommendations Other (comment)(TBD)      Frequency and Duration            Prognosis Prognosis for Safe Diet Advancement: Good      Swallow Study   General HPI: Pt is a 84 y.o. male with a PMH significant for CAD, A. fib, HTN, COPD, GERD, pneumonia ("at least 5 times" 06/12/2017), stroke, and tongue cancer. He presented to [facility] ED from home on 11/24/17 with SOB and cough. Per wife, pt has had poor appetite, increasing weakness over last several days, and 2 recent falls (1 fall 11/15; another on 11/16). Head CT revealed no acute intracranial abnormality, but encephalomalacic changes in medial R frontal lobe. CXR showed mild patchy bilateral lower lung opacities, suspicious for multifocal pneumonia. Type of Study: Bedside Swallow Evaluation Previous Swallow Assessment: none found in chart Diet Prior to this Study: Dysphagia 3 (soft);Thin liquids Temperature Spikes Noted: No Respiratory Status: Room air History of Recent Intubation: No Behavior/Cognition: Alert;Cooperative;Pleasant mood Oral Cavity Assessment: Dry Oral Care Completed by SLP: Yes Oral Cavity - Dentition: Edentulous;Dentures, not available Vision: Functional for self-feeding Self-Feeding Abilities: Able to feed self Patient Positioning: Upright in bed Baseline Vocal Quality: Normal Volitional Cough: Strong;Congested Volitional Swallow: Able to elicit    Oral/Motor/Sensory Function Overall Oral Motor/Sensory Function: Mild impairment Facial ROM: Within Functional Limits Facial Symmetry: Within Functional Limits Lingual ROM: Reduced right;Reduced left Lingual Symmetry: Other (Comment)(resection on L tongue blade) Velum: Within Functional  Limits Mandible: Within Functional Limits   Ice Chips Ice chips: Not tested   Thin Liquid Thin Liquid: Within functional limits Presentation: Cup;Straw    Nectar Thick Nectar Thick Liquid: Not tested   Honey Thick Honey Thick Liquid: Not tested   Puree Puree: Within functional limits Presentation: Spoon;Self Fed   Solid    Jettie Booze, Student SLP  Solid: Not tested      Jettie Booze 11/25/2017,4:41 PM

## 2017-11-25 NOTE — Assessment & Plan Note (Signed)
Eliquis 

## 2017-11-25 NOTE — Assessment & Plan Note (Signed)
On Eliquis

## 2017-11-25 NOTE — Assessment & Plan Note (Addendum)
Amlodipine.

## 2017-11-25 NOTE — Assessment & Plan Note (Signed)
On Eliquis BP meds

## 2017-11-25 NOTE — Progress Notes (Signed)
NAME:  Chad [name], MRN:  [id number], DOB:  01-Dec-1931, LOS: 1 ADMISSION DATE:  11/24/2017, CONSULTATION DATE:  11/24/2017 REFERRING MD:  Alvino Chapel, CHIEF COMPLAINT:  Dysnpea   Brief History   84 y/o male admitted no 11/17 with severe community acquired pneumonia.   History of present illness   84 y/o male with a past medical history significant for coronary artery disease, A. fib, and COPD came to the [facility] emergency department on November 24, 2017 complaining of shortness of breath and cough.  His family provides most of the history because he was confused and on BiPAP.  His wife says that for the last several days he has been feeling more weak, and has had a poor appetite.  He has chronic loose stools which is not changed, she denied any nausea or vomiting or complaints of abdominal pain.  However, she noted increasing weakness and he had 2 falls.  The first was on Friday night, the second was yesterday.  She denied any head trauma.  In the week prior he had been walking with his walker and had been in his usual state of health.  On the morning of November 17 because of increasing shortness of breath and cough with mucus production he asked his wife to move into the emergency room.  She said at first he thought he was choking because she did not think he was that sick but then he insisted so she brought him to the ER.  Here he has been found to be markedly hypoxemic and have labs consistent with sepsis.  He was given IV fluids, antibiotics, and noninvasive mechanical ventilation and pulmonary and critical care medicine was asked to admit.  Past Medical History  COPD, Tongue cancer, atrial flutter, gastroesophageal reflux disease, coronary artery disease, hypertension  Significant Hospital Events   November 17 admitted  Consults:  November 17 pulmonary and critical care medicine  Procedures:    Significant Diagnostic Tests:  November 17 CT head no acute intracranial  process  Micro Data:  November 17 blood culture November 17 urine culture  Antimicrobials:  November 17 ceftriaxone November 17 azithromycin  Interim history/subjective:  Off BiPAP early last night. Converted to NSR before being started on diltiazem.   Objective   Blood pressure 114/66, pulse 80, temperature (!) 97.3 F (36.3 C), temperature source Oral, resp. rate 20, height 6\' 1"  (1.854 m), weight 74.7 kg, SpO2 95 %.        Intake/Output Summary (Last 24 hours) at 11/25/2017 1007 Last data filed at 11/25/2017 0900 Gross per 24 hour  Intake 2288.22 ml  Output 450 ml  Net 1838.22 ml   Filed Weights   11/24/17 1507 11/25/17 0500  Weight: 73.9 kg 74.7 kg    Examination: General:  Frail elderly male in NAD HENT: [place]/AT, PERRL, no appreciable JVD PULM: CTA B, breathing comfortably on room air.  CV: RRR, no MRG GI: BS+, soft, nontender MSK: normal bulk and tone Neuro: alert, somewhat confused as to the events surrounding his admission. But interacts appropriately.    Resolved Hospital Problem list   Severe sepsis, not in septic shock: BP improved with IVF.   Assessment & Plan:  84 year old male with severe sepsis and acute respiratory failure with hypoxemia in the setting of severe community-acquired pneumonia.  Has a baseline history of head and neck cancer, his family notes slow failure to thrive with a 30 pound weight loss over the last year.   Acute respiratory failure with hypoxemia: -  BiPAP PRN, hopefully need for this is over.  - DO NOT RESUSCITATE or intubate.  - maintain O2 saturation 88 to 92% - Transfer to telemetry unit.   Sepsis secondary to Severe community-acquired pneumonia: -Ceftriaxone and azithromycin -Follow-up blood culture results  Acute kidney injury due to severe sepsis: -Continue IV fluid resuscitation at 75 cc an hour with lactated Ringer's -Monitor BMET and UOP -Replace electrolytes as needed  Elevated lactic acid: due to hypoxemia  and sepsis, not due to circulatory shock (BP on my exam 138/78) -treat hypoxemia -repeat lactic acid  Atrial fibrillation with RVR  -continue Eliquis  GERD -continue home PPI/H2 blocker  History of throat Ca - will pursue SLP eval prior to establishing diet.   Code status: Dr. Anastasia Pall discussion with family 11/17 is as follows "I had a discussion with the patient's wife and son in the emergency room regarding the severity of his illness.  I advised that given the severity of the patient's illness we would typically consider intubation and mechanical ventilation.  However, they say that he has had trouble with his throat for years with throat cancer and they understand that he is frail and has become weaker over the last year.  They prefer for [place] to maintain noninvasive mechanical ventilation, IV fluids, and IV antibiotics.  They understand that if his condition worsens we will not perform CPR in the event of cardiac arrest, will not intubated if he has respiratory failure, and we will make him comfortable."   Best practice:  Diet: NPO Pain/Anxiety/Delirium protocol (if indicated): no VAP protocol (if indicated): none DVT prophylaxis: Eliquis GI prophylaxis: n/a Glucose control: monitor glucose with labs Mobility: PT consult on 11/18 if improve Code Status: DNR, BIPAP OK Family Communication: updated wife, son bedside Disposition: admit to ICU  Labs   CBC: Recent Labs  Lab 11/24/17 1515 11/25/17 0630  WBC 7.7 18.4*  NEUTROABS 5.1  --   HGB 12.0* 10.0*  HCT 40.7 30.6*  MCV 89.8 85.7  PLT 255 174    Basic Metabolic Panel: Recent Labs  Lab 11/24/17 1515 11/25/17 0630  NA 136 137  K 5.0 4.2  CL 102 106  CO2 16* 26  GLUCOSE 214* 79  BUN 18 20  CREATININE 1.90* 1.53*  CALCIUM 8.4* 7.7*   GFR: Estimated Creatinine Clearance: 38 mL/min (A) (by C-G formula based on SCr of 1.53 mg/dL (H)). Recent Labs  Lab 11/24/17 1515 11/24/17 1549 11/24/17 1910 11/24/17 2206  11/25/17 0630  WBC 7.7  --   --   --  18.4*  LATICACIDVEN  --  12.00* 2.3* 1.5  --     Liver Function Tests: Recent Labs  Lab 11/24/17 1515  AST 66*  ALT 26  ALKPHOS 98  BILITOT 0.8  PROT 6.2*  ALBUMIN 2.4*   No results for input(s): LIPASE, AMYLASE in the last 168 hours. No results for input(s): AMMONIA in the last 168 hours.  ABG    Component Value Date/Time   PHART 7.401 11/24/2017 1631   PCO2ART 31.7 (L) 11/24/2017 1631   PO2ART 58.0 (L) 11/24/2017 1631   HCO3 19.4 (L) 11/24/2017 1631   TCO2 20 (L) 11/24/2017 1631   ACIDBASEDEF 4.0 (H) 11/24/2017 1631   O2SAT 88.0 11/24/2017 1631     Coagulation Profile: Recent Labs  Lab 11/24/17 1515  INR 1.54    Cardiac Enzymes: Recent Labs  Lab 11/24/17 1910 11/25/17 0248 11/25/17 0630  TROPONINI 0.09* 0.07* 0.06*    HbA1C: Hgb A1c MFr  Bld  Date/Time Value Ref Range Status  06/04/2017 06:48 AM 5.6 4.8 - 5.6 % Final    Comment:    (NOTE) Pre diabetes:          5.7%-6.4% Diabetes:              >6.4% Glycemic control for   <7.0% adults with diabetes   01/12/2013 07:20 PM 5.6 <5.7 % Final    Comment:    (NOTE)                                                                       According to the ADA Clinical Practice Recommendations for 2011, when HbA1c is used as a screening test:  >=6.5%   Diagnostic of Diabetes Mellitus           (if abnormal result is confirmed) 5.7-6.4%   Increased risk of developing Diabetes Mellitus References:Diagnosis and Classification of Diabetes Mellitus,Diabetes XVQM,0867,61(PJKDT 1):S62-S69 and Standards of Medical Care in         Diabetes - 2011,Diabetes OIZT,2458,09 (Suppl 1):S11-S61.    CBG: Recent Labs  Lab 11/24/17 [place], AGACNP-BC [place] Pager [phone] or [phone]  11/25/2017 10:15 AM

## 2017-11-26 ENCOUNTER — Inpatient Hospital Stay (HOSPITAL_COMMUNITY): Payer: Medicare Other

## 2017-11-26 ENCOUNTER — Ambulatory Visit: Payer: Medicare Other | Admitting: Internal Medicine

## 2017-11-26 DIAGNOSIS — J189 Pneumonia, unspecified organism: Secondary | ICD-10-CM

## 2017-11-26 LAB — CBC
HCT: 28.4 % — ABNORMAL LOW (ref 39.0–52.0)
Hemoglobin: 9.1 g/dL — ABNORMAL LOW (ref 13.0–17.0)
MCH: 27.3 pg (ref 26.0–34.0)
MCHC: 32 g/dL (ref 30.0–36.0)
MCV: 85.3 fL (ref 80.0–100.0)
NRBC: 0 % (ref 0.0–0.2)
PLATELETS: 177 10*3/uL (ref 150–400)
RBC: 3.33 MIL/uL — ABNORMAL LOW (ref 4.22–5.81)
RDW: 14.9 % (ref 11.5–15.5)
WBC: 14.7 10*3/uL — ABNORMAL HIGH (ref 4.0–10.5)

## 2017-11-26 LAB — BASIC METABOLIC PANEL
Anion gap: 8 (ref 5–15)
BUN: 20 mg/dL (ref 8–23)
CALCIUM: 7.7 mg/dL — AB (ref 8.9–10.3)
CO2: 22 mmol/L (ref 22–32)
CREATININE: 1.34 mg/dL — AB (ref 0.61–1.24)
Chloride: 109 mmol/L (ref 98–111)
GFR calc non Af Amer: 47 mL/min — ABNORMAL LOW (ref >60)
GFR, EST AFRICAN AMERICAN: 54 mL/min — AB (ref >60)
Glucose, Bld: 60 mg/dL — ABNORMAL LOW (ref 70–99)
Potassium: 3.8 mmol/L (ref 3.5–5.1)
Sodium: 139 mmol/L (ref 135–145)

## 2017-11-26 MED ORDER — ENSURE ENLIVE PO LIQD
237.0000 mL | Freq: Two times a day (BID) | ORAL | Status: DC
  Administered 2017-11-26 – 2017-11-27 (×2): 237 mL via ORAL

## 2017-11-26 MED ORDER — RESOURCE THICKENUP CLEAR PO POWD
ORAL | Status: DC | PRN
  Filled 2017-11-26: qty 125

## 2017-11-26 MED ORDER — AMOXICILLIN-POT CLAVULANATE 500-125 MG PO TABS
500.0000 mg | ORAL_TABLET | Freq: Two times a day (BID) | ORAL | Status: DC
  Administered 2017-11-26 – 2017-11-27 (×3): 500 mg via ORAL
  Filled 2017-11-26 (×4): qty 1

## 2017-11-26 NOTE — Progress Notes (Addendum)
  Speech Language Pathology Treatment: Dysphagia  Patient Details Name: Chad [name] MRN: [id number] DOB: 1931-01-20 Today's Date: 11/26/2017 Time: 0998-3382 SLP Time Calculation (min) (ACUTE ONLY): 15 min  Assessment / Plan / Recommendation Clinical Impression  Pt was seen for dysphagia treatment session focused on education this afternoon. Results of MBSS and suspected etiologies were extensively reviewed and discussed with pt and wife at bedside. Per wife report of pt's persistent coughing during PO intake, suspect dysphagia more likely chronic in nature exacerbated by recent stroke (May 2019) and COPD exacerbation/weakness. SLP educated pt and wife regarding correlation between aspiration and pneumonia in the context of pt's recurrent pneumonias over the last year. Thorough explanation also provided regarding use of thickener to achieve honey thick consistency as well as intention for honey thick liquids to be used as a short-term precaution while pt's medical status is compromised and therefore at an even greater risk for aspiration. SLP will continue to provide treatment with possible respiratory muscle strength training exercises and compensatory strategy training while in acute setting. Recommend pt continue to receive home health SLP services upon d/c in order to dertermine efficiency of honey thick liquids as well as readiness for repeat outpatient MBSS for potential advancement to thin liquids.    HPI HPI: Pt is a 84 y.o. male with a PMH significant for CAD, A. fib, HTN, COPD, GERD, pneumonia ("at least 5 times" 06/12/2017), stroke (May 2019), and tongue cancer (1998). He presented to [facility] ED from home on 11/24/17 with SOB and cough. Per wife, pt has had poor appetite, increasing weakness over last several days, and 2 recent falls (1 fall 11/15; another on 11/16). Head CT revealed no acute intracranial abnormality, but encephalomalacic changes in medial R frontal lobe. CXR showed mild patchy  bilateral lower lung opacities, suspicious for multifocal pneumonia.      SLP Plan  Continue with current plan of care       Recommendations  Diet recommendations: Dysphagia 2 (fine chop);Honey-thick liquid Liquids provided via: Cup Medication Administration: Crushed with puree Supervision: Full supervision/cueing for compensatory strategies Compensations: Slow rate;Small sips/bites;Clear throat intermittently;Multiple dry swallows after each bite/sip Postural Changes and/or Swallow Maneuvers: Seated upright 90 degrees                Oral Care Recommendations: Oral care BID Follow up Recommendations: Home health SLP SLP Visit Diagnosis: Dysphagia, unspecified (R13.10) Plan: Continue with current plan of care       Jettie Booze, Student SLP                Jettie Booze 11/26/2017, 2:55 PM

## 2017-11-26 NOTE — Progress Notes (Signed)
[facility]  Patient Status: Active (receiving services up to time of hospitalization)  [facility] is providing the following services: RN and PT  If patient discharges after hours, please call [phone].   Chad [name] 11/26/2017, 9:52 AM

## 2017-11-26 NOTE — Progress Notes (Signed)
PROGRESS NOTE    CHAUN UEMURA  EXH:[id number] DOB: May 19, 1931 DOA: 11/24/2017 PCP: Cassandria Anger, MD  Brief Narrative: 84 year old male with history of COPD, CAD, paroxysmal atrial fibrillation, h/o tongue CA status post surgery in 2000 admitted to the ICU on 11/17 with acute hypoxic respiratory failure, sepsis, pneumonia and A. fib RVR. -Treated with BiPAP, antibiotics, Cardizem drip -transferred to [facility] service today 11/19   Assessment & Plan:   Acute respiratory failure with hypoxemia: -Suspect this is secondary to aspiration pneumonia -Long history suggestive of aspiration, remote tongue CA and surgery -Treated with BiPAP and ceftriaxone/azithromycin in ICU -Clinically stable, off BiPAP transition to oral Augmentin because I suspect aspiration is likely inciting event -Following, currently n.p.o. for MBS today -Ambulate, PT OT, wean off O2  Sepsis secondary to severe aspiration pneumonia -present on admission -Resolved  Acute kidney injury --Improving, down to 1.3 today, stop IV fluids  Paroxysmal Atrial fibrillation with RVR  -HR improved, off cardizem -likely triggered by pneumonia, hypoxemia -continue Eliquis  GERD -continue home PPI/H2 blocker  Remote History of throat Ca - SLP eval as above, MBS today  DVT prophylaxis: eliquis Code Status: DNR Family Communication: son at bedside Disposition Plan: Home in 1 -2days  Consultants:   PCCM Tx   Procedures:   Antimicrobials:  Antibiotics Given (last 72 hours)    Date/Time Action Medication Dose Rate   11/24/17 1630 New Bag/Given   cefTRIAXone (ROCEPHIN) 2 g in sodium chloride 0.9 % 100 mL IVPB 2 g 200 mL/hr   11/24/17 1720 New Bag/Given   azithromycin (ZITHROMAX) 500 mg in sodium chloride 0.9 % 250 mL IVPB 500 mg 250 mL/hr   11/25/17 1736 New Bag/Given   azithromycin (ZITHROMAX) 500 mg in sodium chloride 0.9 % 250 mL IVPB 500 mg 250 mL/hr   11/25/17 1744 New Bag/Given   cefTRIAXone  (ROCEPHIN) 2 g in sodium chloride 0.9 % 100 mL IVPB 2 g 200 mL/hr      Subjective: -feels better, h/o coughing after eating and drinking for years, recurrent pneumonias  Objective: Vitals:   11/25/17 2213 11/25/17 2306 11/26/17 0300 11/26/17 0747  BP: 125/68 (!) 119/50  115/60  Pulse: 85 81  81  Resp: 16 12  16   Temp: 98.1 F (36.7 C) 97.9 F (36.6 C)  98.8 F (37.1 C)  TempSrc: Oral Oral  Oral  SpO2: 96% 98%  93%  Weight:   74.7 kg   Height:        Intake/Output Summary (Last 24 hours) at 11/26/2017 1324 Last data filed at 11/26/2017 0700 Gross per 24 hour  Intake 914.61 ml  Output 751 ml  Net 163.61 ml   Filed Weights   11/24/17 1507 11/25/17 0500 11/26/17 0300  Weight: 73.9 kg 74.7 kg 74.7 kg    Examination:  General exam: Appears calm and comfortable, no distress Respiratory system: few basilar ronchi Cardiovascular system: S1 & S2 heard, RRR Gastrointestinal system: Abdomen is nondistended, soft and nontender.Normal bowel sounds heard. Extremities: no edema Skin: No rashes, lesions or ulcers Psychiatry: Judgement and insight appear normal. Mood & affect appropriate.     Data Reviewed:   CBC: Recent Labs  Lab 11/24/17 1515 11/25/17 0630 11/26/17 0308  WBC 7.7 18.4* 14.7*  NEUTROABS 5.1  --   --   HGB 12.0* 10.0* 9.1*  HCT 40.7 30.6* 28.4*  MCV 89.8 85.7 85.3  PLT 255 174 381   Basic Metabolic Panel: Recent Labs  Lab 11/24/17 1515 11/25/17 0630 11/26/17 0308  NA 136 137 139  K 5.0 4.2 3.8  CL 102 106 109  CO2 16* 26 22  GLUCOSE 214* 79 60*  BUN 18 20 20   CREATININE 1.90* 1.53* 1.34*  CALCIUM 8.4* 7.7* 7.7*   GFR: Estimated Creatinine Clearance: 43.4 mL/min (A) (by C-G formula based on SCr of 1.34 mg/dL (H)). Liver Function Tests: Recent Labs  Lab 11/24/17 1515  AST 66*  ALT 26  ALKPHOS 98  BILITOT 0.8  PROT 6.2*  ALBUMIN 2.4*   No results for input(s): LIPASE, AMYLASE in the last 168 hours. No results for input(s): AMMONIA  in the last 168 hours. Coagulation Profile: Recent Labs  Lab 11/24/17 1515  INR 1.54   Cardiac Enzymes: Recent Labs  Lab 11/24/17 1910 11/25/17 0248 11/25/17 0630  TROPONINI 0.09* 0.07* 0.06*   BNP (last 3 results) Recent Labs    04/24/17 1450  PROBNP 246.0*   HbA1C: No results for input(s): HGBA1C in the last 72 hours. CBG: Recent Labs  Lab 11/24/17 1537  GLUCAP 117*   Lipid Profile: No results for input(s): CHOL, HDL, LDLCALC, TRIG, CHOLHDL, LDLDIRECT in the last 72 hours. Thyroid Function Tests: No results for input(s): TSH, T4TOTAL, FREET4, T3FREE, THYROIDAB in the last 72 hours. Anemia Panel: No results for input(s): VITAMINB12, FOLATE, FERRITIN, TIBC, IRON, RETICCTPCT in the last 72 hours. Urine analysis:    Component Value Date/Time   COLORURINE YELLOW 11/24/2017 1700   APPEARANCEUR CLEAR 11/24/2017 1700   LABSPEC 1.017 11/24/2017 1700   PHURINE 5.0 11/24/2017 1700   GLUCOSEU NEGATIVE 11/24/2017 1700   GLUCOSEU NEGATIVE 04/24/2016 0728   HGBUR NEGATIVE 11/24/2017 1700   BILIRUBINUR NEGATIVE 11/24/2017 1700   BILIRUBINUR negative 05/31/2015 0910   BILIRUBINUR Negative 04/28/2014 1516   KETONESUR NEGATIVE 11/24/2017 1700   PROTEINUR NEGATIVE 11/24/2017 1700   UROBILINOGEN 0.2 04/24/2016 0728   NITRITE NEGATIVE 11/24/2017 1700   LEUKOCYTESUR NEGATIVE 11/24/2017 1700   Sepsis Labs: @LABRCNTIP (procalcitonin:4,lacticidven:4)  ) Recent Results (from the past 240 hour(s))  Urine culture     Status: Abnormal   Collection Time: 11/24/17  2:49 PM  Result Value Ref Range Status   Specimen Description URINE, RANDOM  Final   Special Requests unknown Normal  Final   Culture (A)  Final    <10,000 COLONIES/mL INSIGNIFICANT GROWTH Performed at [facility], [place] [address] [place], [place] [address]    Report Status 11/25/2017 FINAL  Final  Culture, blood (routine x 2)     Status: None (Preliminary result)   Collection Time: 11/24/17  4:02 PM  Result  Value Ref Range Status   Specimen Description BLOOD LEFT ANTECUBITAL  Final   Special Requests   Final    BOTTLES DRAWN AEROBIC AND ANAEROBIC Blood Culture adequate volume   Culture   Final    NO GROWTH < 24 HOURS Performed at [facility], [place] [address] [place], [place] [address]    Report Status PENDING  Incomplete  Culture, blood (routine x 2)     Status: None (Preliminary result)   Collection Time: 11/24/17  4:04 PM  Result Value Ref Range Status   Specimen Description BLOOD BLOOD LEFT ARM  Final   Special Requests   Final    BOTTLES DRAWN AEROBIC AND ANAEROBIC Blood Culture adequate volume   Culture   Final    NO GROWTH < 24 HOURS Performed at [facility], [place] [address] [place], [place] [address]    Report Status PENDING  Incomplete  MRSA PCR Screening     Status: None   Collection Time: 11/24/17  7:30 PM  Result Value Ref Range Status   MRSA by PCR NEGATIVE NEGATIVE Final    Comment:        The GeneXpert MRSA Assay (FDA approved for NASAL specimens only), is one component of a comprehensive MRSA colonization surveillance program. It is not intended to diagnose MRSA infection nor to guide or monitor treatment for MRSA infections. Performed at [facility], [place] [address], [place], [place] [address]          Radiology Studies: Ct Head Wo Contrast  Result Date: 11/24/2017 CLINICAL DATA:  Altered mental status, fever, generalized weakness EXAM: CT HEAD WITHOUT CONTRAST TECHNIQUE: Contiguous axial images were obtained from the base of the skull through the vertex without intravenous contrast. COMPARISON:  08/01/2016 FINDINGS: Brain: No evidence of acute infarction, hemorrhage, hydrocephalus, extra-axial collection or mass lesion/mass effect. Encephalomalacic changes in the medial right frontal lobe. Subcortical white matter and periventricular small vessel ischemic changes. Vascular: Intracranial atherosclerosis. Skull: Normal. Negative for  fracture or focal lesion. Sinuses/Orbits: The visualized paranasal sinuses are essentially clear. The mastoid air cells are unopacified. Other: None. IMPRESSION: No evidence of acute intracranial abnormality. Encephalomalacic changes in the medial right frontal lobe. Small vessel ischemic changes. Electronically Signed   By: Julian Hy M.D.   On: 11/24/2017 15:42   Dg Chest Port 1 View  Result Date: 11/24/2017 CLINICAL DATA:  Altered level of consciousness EXAM: PORTABLE CHEST 1 VIEW COMPARISON:  08/14/2017 FINDINGS: Mild patchy bilateral lower lung opacities, suspicious for multifocal pneumonia, possibly on the basis of aspiration. No pleural effusion or pneumothorax. Biapical pleural-parenchymal scarring. The heart is normal in size. Left subclavian pacemaker. IMPRESSION: Mild patchy bilateral lower lung opacities, suspicious for multifocal pneumonia, possibly on the basis of aspiration. Electronically Signed   By: Julian Hy M.D.   On: 11/24/2017 15:39        Scheduled Meds: . amoxicillin-clavulanate  500 mg Oral BID  . apixaban  2.5 mg Oral BID  . famotidine  20 mg Oral QHS  . feeding supplement (ENSURE ENLIVE)  237 mL Oral BID BM  . gabapentin  300 mg Oral Daily  . pantoprazole  40 mg Oral Daily  . senna-docusate  1 tablet Oral Daily   Continuous Infusions:   LOS: 2 days    Time spent: 45min    Domenic Polite, MD [facility] Page via [email or web address], password [email or web address] After 7PM please contact night-coverage  11/26/2017, 1:24 PM

## 2017-11-26 NOTE — Consult Note (Signed)
   [facility] Inpatient Consult   11/26/2017  Chad [name] 06-17-1931 [id number]  Patient is currently active with [facility] for chronic disease management services.  Patient has been engaged by a [place].  Our community based plan of care has focused on disease management and community resource support. Met with patient and wife, son entered during conversation. Wife states she endorses ongoing follow up with [facility]. They endorse Dr.  She would like to have more information with their [place] benefits for supplies and a ramp.   Patient will receive a post hospital call and will continue to follow for complex disease care coordinator and resources referrals.   Will make Inpatient Case Manager aware that [facility] following. Will follow up with [place] to follow up on complex disease management and resources. Of note, [facility] Care Management services does not replace or interfere with any services that are needed or arranged by inpatient case management or social work.  For additional questions or referrals please contact:  Natividad Brood, RN BSN [facility] Liaison  [phone] business mobile phone Toll free office [phone]

## 2017-11-26 NOTE — Progress Notes (Signed)
Modified Barium Swallow Progress Note  Patient Details  Name: Chad [name] MRN: [id number] Date of Birth: 12/15/1931  Today's Date: 11/26/2017  Modified Barium Swallow completed.  Full report located under Chart Review in the Imaging Section.  Brief recommendations include the following:  Clinical Impression  Pt exhibits mild oral and moderate pharyngeal dysphagia with impairments resulting from multifactors including recent stroke (5/19 with 5 pna episodes since), anatomical difference (suspect cervical spine kyphosis), decreased timing of swallow initiation/laryngeal vestibule closure, mildly reduced stripping wave and questionable UES function. Occasional lingual pumping and lingual residue with honey thick liquids as well as significantly prolonged mastication observed with regular texture. Thin and nectar liquids were penetrated to vocal fold level without sensation primarily prior to swallow initiation indicative of decreased timing of swallow mechanisms. Multiple compensatory strategies (chin tuck, breath hold, head turn to left) attempted with thin and nectar, however none were beneficial to aid in more timely/complete airway protection. Although moderate vallecular residue was present with honey thick liquids, only one instance of trace penetration above the vocal folds was noted. Verbal cues for second swallows were mildy effective to reduce residue. Intermittently reduced upper esophageal sphincter (UES) relaxation observed with thin and nectar exacerbated by implementation of chin tuck strategy. Suspected reduced pharyngeal stripping wave resulting in pharyngeal/vallecular residue with difficulty clearing. Recommend dysphagia 2 (minced) diet texture and short term use of honey thick liquids and full supervision to ensure upright positioning, small bites/sips, intermittent cough/throat clear throughout PO intake, and extra dry swallows to aid in clearance of vallecular residue. ST will  continue to follow to provide treatment with diet safety, efficiency while pt remains in acute setting and potential to upgrade liquid consistency via exercise, RMT.   Swallow Evaluation Recommendations       SLP Diet Recommendations: Dysphagia 2 (Fine chop) solids;Honey thick liquids   Liquid Administration via: Cup   Medication Administration: Crushed with puree   Supervision: Full supervision/cueing for compensatory strategies;Patient able to self feed   Compensations: Slow rate;Small sips/bites;Clear throat intermittently;Multiple dry swallows after each bite/sip   Postural Changes: Seated upright at 90 degrees   Oral Care Recommendations: Oral care BID        Houston Siren 11/26/2017,1:40 PM   Orbie Pyo Puhi.Ed Risk analyst [phone] Office [phone]

## 2017-11-26 NOTE — Progress Notes (Addendum)
Initial Nutrition Assessment  DOCUMENTATION CODES:   Not applicable  INTERVENTION:    Ensure Enlive po BID (thickened to honey consistency), each supplement provides 350 kcal and 20 grams of protein  NUTRITION DIAGNOSIS:   Increased nutrient needs related to acute illness, chronic illness as evidenced by estimated needs  GOAL:   Patient will meet greater than or equal to 90% of their needs  MONITOR:   PO intake, Supplement acceptance, Labs, Weight trends, Skin, I & O's  REASON FOR ASSESSMENT:   Malnutrition Screening Tool  ASSESSMENT:   84 yo Male with PMH of COPD presented with severe sepsis and acute respiratory failure with hypoxemia in the setting of severe community-acquired pneumonia.    RD briefly spoke with pt at bedside. Limited nutrition history obtained.  Per H&P pt has been experiencing a poor appetite with weakness PTA. Family also reported pt has had a 30 lb weight loss over the last year. S/p MBSS this AM. SLP rec Dysphagia 2-honey thick liquid diet.  Pt did report he likes Ensure supplements; drinks them at home. Medications include Pepcid, Protonix and ABX. Labs reviewed. CBG 117.  NUTRITION - FOCUSED PHYSICAL EXAM:  Deferred a this time. Suspect malnutrition.  Diet Order:   Diet Order            DIET DYS 2 Room service appropriate? Yes; Fluid consistency: Honey Thick  Diet effective now             EDUCATION NEEDS:   No education needs have been identified at this time  Skin:  Skin Assessment: Reviewed RN Assessment  Last BM:  11/18  Height:   Ht Readings from Last 1 Encounters:  11/24/17 6\' 1"  (1.854 m)   Weight:   Wt Readings from Last 1 Encounters:  11/26/17 74.7 kg   BMI:  Body mass index is 21.73 kg/m.  Estimated Nutritional Needs:   Kcal:  1800-2000  Protein:  90-105 gm  Fluid:  1.8-2.0 L  Arthur Holms, RD, LDN Pager #: [phone] After-Hours Pager #: [phone]

## 2017-11-27 LAB — CBC WITH DIFFERENTIAL/PLATELET
Abs Immature Granulocytes: 0 10*3/uL (ref 0.00–0.07)
BASOS ABS: 0.1 10*3/uL (ref 0.0–0.1)
BASOS PCT: 1 %
EOS PCT: 1 %
Eosinophils Absolute: 0.1 10*3/uL (ref 0.0–0.5)
HEMATOCRIT: 27.5 % — AB (ref 39.0–52.0)
Hemoglobin: 9 g/dL — ABNORMAL LOW (ref 13.0–17.0)
LYMPHS PCT: 5 %
Lymphs Abs: 0.5 10*3/uL — ABNORMAL LOW (ref 0.7–4.0)
MCH: 27.7 pg (ref 26.0–34.0)
MCHC: 32.7 g/dL (ref 30.0–36.0)
MCV: 84.6 fL (ref 80.0–100.0)
Monocytes Absolute: 0.2 10*3/uL (ref 0.1–1.0)
Monocytes Relative: 2 %
NRBC: 0 % (ref 0.0–0.2)
Neutro Abs: 8.4 10*3/uL — ABNORMAL HIGH (ref 1.7–7.7)
Neutrophils Relative %: 91 %
PLATELETS: 183 10*3/uL (ref 150–400)
RBC: 3.25 MIL/uL — AB (ref 4.22–5.81)
RDW: 15.1 % (ref 11.5–15.5)
WBC: 9.2 10*3/uL (ref 4.0–10.5)
nRBC: 1 /100 WBC — ABNORMAL HIGH

## 2017-11-27 LAB — BASIC METABOLIC PANEL
ANION GAP: 5 (ref 5–15)
BUN: 14 mg/dL (ref 8–23)
CHLORIDE: 111 mmol/L (ref 98–111)
CO2: 27 mmol/L (ref 22–32)
Calcium: 8.1 mg/dL — ABNORMAL LOW (ref 8.9–10.3)
Creatinine, Ser: 1.32 mg/dL — ABNORMAL HIGH (ref 0.61–1.24)
GFR calc non Af Amer: 48 mL/min — ABNORMAL LOW (ref >60)
GFR, EST AFRICAN AMERICAN: 55 mL/min — AB (ref >60)
Glucose, Bld: 86 mg/dL (ref 70–99)
POTASSIUM: 3.2 mmol/L — AB (ref 3.5–5.1)
SODIUM: 143 mmol/L (ref 135–145)

## 2017-11-27 LAB — LACTATE DEHYDROGENASE: LDH: 213 U/L — ABNORMAL HIGH (ref 98–192)

## 2017-11-27 LAB — RETICULOCYTES
Immature Retic Fract: 17.2 % — ABNORMAL HIGH (ref 2.3–15.9)
RBC.: 3.25 MIL/uL — ABNORMAL LOW (ref 4.22–5.81)
RETIC COUNT ABSOLUTE: 34.5 10*3/uL (ref 19.0–186.0)
Retic Ct Pct: 1.1 % (ref 0.4–3.1)

## 2017-11-27 LAB — PROTIME-INR
INR: 1.44
Prothrombin Time: 17.4 seconds — ABNORMAL HIGH (ref 11.4–15.2)

## 2017-11-27 MED ORDER — ENSURE ENLIVE PO LIQD: 237.0000 mL | Freq: Two times a day (BID) | ORAL | 12 refills | Status: AC

## 2017-11-27 MED ORDER — SACCHAROMYCES BOULARDII 250 MG PO CAPS: 250.0000 mg | ORAL_CAPSULE | Freq: Two times a day (BID) | ORAL | Status: DC

## 2017-11-27 MED ORDER — INFLUENZA VAC SPLIT HIGH-DOSE 0.5 ML IM SUSY
0.5000 mL | PREFILLED_SYRINGE | Freq: Once | INTRAMUSCULAR | Status: DC
  Filled 2017-11-27: qty 0.5

## 2017-11-27 MED ORDER — SACCHAROMYCES BOULARDII 250 MG PO CAPS: 250.0000 mg | ORAL_CAPSULE | Freq: Two times a day (BID) | ORAL | 0 refills | Status: AC

## 2017-11-27 MED ORDER — RESOURCE THICKENUP CLEAR PO POWD: 1.0000 g | ORAL | 0 refills | Status: AC | PRN

## 2017-11-27 MED ORDER — SACCHAROMYCES BOULARDII 250 MG PO CAPS
250.0000 mg | ORAL_CAPSULE | Freq: Two times a day (BID) | ORAL | Status: DC
  Administered 2017-11-27: 250 mg via ORAL
  Filled 2017-11-27: qty 1

## 2017-11-27 MED ORDER — POTASSIUM CHLORIDE CRYS ER 20 MEQ PO TBCR
40.0000 meq | EXTENDED_RELEASE_TABLET | Freq: Once | ORAL | Status: AC
  Administered 2017-11-27: 40 meq via ORAL
  Filled 2017-11-27: qty 2

## 2017-11-27 MED ORDER — AMOXICILLIN-POT CLAVULANATE 500-125 MG PO TABS: 500.0000 mg | ORAL_TABLET | Freq: Two times a day (BID) | ORAL | 0 refills | Status: AC

## 2017-11-27 MED FILL — RESOURCE THICKEN UP: 15 days supply | Qty: 125 | Fill #0

## 2017-11-27 MED FILL — FLORASTOR 250 MG CAPSULE: 250 | 10 days supply | Qty: 20 | Fill #0

## 2017-11-27 MED FILL — AMOX-CLAV 500-125 MG TABLET: 500-125 | 3 days supply | Qty: 6 | Fill #0

## 2017-11-27 NOTE — Discharge Summary (Addendum)
[facility] Discharge Summary   Patient: Chad [name] LEX:[id number]   PCP: Cassandria Anger, MD DOB: December 27, 1931   Date of admission: 11/24/2017   Date of discharge:  11/27/2017    Discharge Diagnoses:  Acute respiratory failure with hypoxemia [facility])  Admitted From: home Disposition:  Home with home health  Recommendations for Outpatient Follow-up:  1. Please follow-up with PCP in 1 week  Follow-up Information    Plotnikov, Evie Lacks, MD. Schedule an appointment as soon as possible for a visit in 1 week(s).   Specialty:  Internal Medicine Contact information: [address] [place] [address] [phone]        Health, [facility] Follow up.   Specialty:  Home Health Services Why:  HHRN,HHPT, [place], [place] Contact information: [place] [address] [place] Follow up.   Why:  [facility] Contact information: [place] [address] [phone]          Diet recommendation: Dysphagia 2 (fine chop);Honey-thick liquid Liquids provided via: Cup Medication Administration: Crushed with puree Supervision: Full supervision/cueing for compensatory strategies Compensations: Slow rate;Small sips/bites;Clear throat intermittently;Multiple dry swallows after each bite/sip Postural Changes and/or Swallow Maneuvers: Seated upright 90 degrees  Activity: The patient is advised to gradually reintroduce usual activities.  Discharge Condition: good  Code Status: DNR DNI  History of present illness: As per the H and P dictated on admission, "84 y/o male with a past medical history significant for coronary artery disease, A. fib, and COPD came to the [facility] emergency department on November 24, 2017 complaining of shortness of breath and cough.  His family provides most of the history because he was confused and on BiPAP.  His wife says that for the last several days he has been  feeling more weak, and has had a poor appetite.  He has chronic loose stools which is not changed, she denied any nausea or vomiting or complaints of abdominal pain.  However, she noted increasing weakness and he had 2 falls.  The first was on Friday night, the second was yesterday.  She denied any head trauma.  In the week prior he had been walking with his walker and had been in his usual state of health.  On the morning of November 17 because of increasing shortness of breath and cough with mucus production he asked his wife to move into the emergency room.  She said at first he thought he was choking because she did not think he was that sick but then he insisted so she brought him to the ER.  Here he has been found to be markedly hypoxemic and have labs consistent with sepsis.  He was given IV fluids, antibiotics, and noninvasive mechanical ventilation and pulmonary and critical care medicine was asked to admit."  Hospital Course:  Summary of his active problems in the hospital is as following. Acute respiratory failure with hypoxemia Sepsis secondary to acute aspiration pneumonia. Febrile, Tachycardic, tachypneic as well as hypoxic on admission. -Long history suggestive of aspiration, remote tongue CA and surgery -Treated with BiPAP and ceftriaxone/azithromycin in ICU -Clinically stable, off BiPAP transition to oral Augmentin suspect aspiration is likely inciting event -SLP consulted, recommend dysphagia 2diet honey thick liquid. -Now on room air. Continue home health PT OT as well as SLP.  Acute kidney injury --Improving, down to 1.3, stop IV fluids  Paroxysmal Atrial fibrillation with RVR  -  HR improved, off cardizem -likely triggered by pneumonia, hypoxemia -continue Eliquis  GERD -continue home PPI/H2 blocker  Remote History of throat Ca - SLP eval as above,   Dyslipidemia. Continue Lipitor.  Recurrent falls at home. Recommend bedside commode. Also recommend PCP to  discuss utility of antiregulation in a patient who has recurrent falls at home. Patient is on gabapentin as well as.  Recommend PCP to down titrate this medications as well.  Nutrition Problem: Increased nutrient needs Etiology: acute illness, chronic illness Nutrition Interventions: Interventions: Ensure Enlive (each supplement provides 350kcal and 20 grams of protein)  All other chronic medical condition were stable during the hospitalization.  Patient was seen by physical therapy, who recommended home health, which was arranged by Education officer, museum and case Freight forwarder. On the day of the discharge the patient's vitals were stable , and no other acute medical condition were reported by patient. the patient was felt safe to be discharge at home with home health.  Consultants: PCCM primary admit Procedures: none  DISCHARGE MEDICATION: Allergies as of 11/27/2017      Reactions   Dyazide [hydrochlorothiazide W-triamterene] Other (See Comments)   Caused hyponatremia 04/2012   Lisinopril Other (See Comments)   cough      Medication List    STOP taking these medications   amLODipine 2.5 MG tablet Commonly known as:  NORVASC   traMADol 50 MG tablet Commonly known as:  ULTRAM     TAKE these medications   amoxicillin-clavulanate 500-125 MG tablet Commonly known as:  AUGMENTIN Take 1 tablet (500 mg total) by mouth 2 (two) times daily for 3 days.   apixaban 2.5 MG Tabs tablet Commonly known as:  ELIQUIS Take 1 tablet (2.5 mg total) by mouth 2 (two) times daily. Resume 7/28/19PM   atorvastatin 40 MG tablet Commonly known as:  LIPITOR Take 1 tablet (40 mg total) by mouth daily at 6 PM.   cholecalciferol 1000 units tablet Commonly known as:  VITAMIN D Take 1 tablet (1,000 Units total) by mouth daily.   clidinium-chlordiazePOXIDE 5-2.5 MG capsule Commonly known as:  LIBRAX Take 1 capsule by mouth 2 (two) times daily.   cyanocobalamin 1000 MCG tablet Take 1,000 mcg by mouth daily.     famotidine 20 MG tablet Commonly known as:  PEPCID Take 1 tablet (20 mg total) by mouth at bedtime.   feeding supplement (ENSURE ENLIVE) Liqd Take 237 mLs by mouth 2 (two) times daily between meals.   fluticasone 50 MCG/ACT nasal spray Commonly known as:  FLONASE USE 2 SPRAYS IN EACH NOSTRIL DAILY What changed:    when to take this  reasons to take this   gabapentin 300 MG capsule Commonly known as:  NEURONTIN Take 3 capsules at bedtime. What changed:    how much to take  how to take this  when to take this  additional instructions   ipratropium 0.02 % nebulizer solution Commonly known as:  ATROVENT Take 2.5 mLs (0.5 mg total) by nebulization 3 (three) times daily. Via [facility]   multivitamin-lutein Caps capsule Take 1 capsule by mouth daily.   pantoprazole 40 MG tablet Commonly known as:  PROTONIX Take 1 tablet (40 mg total) by mouth daily. Take 30-60 min before first meal of the day   polyethylene glycol powder powder Commonly known as:  GLYCOLAX/MIRALAX Take 17 g by mouth 2 (two) times daily as needed. What changed:  reasons to take this   RESOURCE THICKENUP CLEAR Powd Take 1 g by mouth as needed.  saccharomyces boulardii 250 MG capsule Commonly known as:  FLORASTOR Take 1 capsule (250 mg total) by mouth 2 (two) times daily for 3 days.   senna-docusate 8.6-50 MG tablet Commonly known as:  Senokot-S Take 1 tablet by mouth daily.   tetrahydrozoline 0.05 % ophthalmic solution Place 1 drop into both eyes as needed (dry eyes).            Durable Medical Equipment  (From admission, onward)         Start     Ordered   11/27/17 1230  For home use only DME Bedside commode  Once    Question:  Patient needs a bedside commode to treat with the following condition  Answer:  Weakness   11/27/17 1230   11/27/17 0000  DME Bedside commode    Question:  Patient needs a bedside commode to treat with the following condition  Answer:  Recurrent falls   11/27/17  1251         Allergies  Allergen Reactions  . Dyazide [Hydrochlorothiazide W-Triamterene] Other (See Comments)    Caused hyponatremia 04/2012  . Lisinopril Other (See Comments)    cough   Discharge Instructions    AMB Referral to [place] Management   Complete by:  As directed    *Patient active with [facility] [Frances can close case] but hospitalized and returning home with home health.  Please assign to community [facility] for post hospital follow up for complex disease management.  His primary care office does the TOC.  *Please assign to social worker regarding resource associated with his [employer] for incontinent supplies, a ramp.  Wife would like to know of any agency or if [facility] can do for 'free'. For questions, please contact:  Natividad Brood, RN BSN [facility] Liaison  [phone] business mobile phone Toll free office [phone]   Reason for consult:  Community follow up,   Diagnoses of:  COPD/ Pneumonia   Expected date of contact:  1-3 days (reserved for hospital discharges)   DIET DYS 2   Complete by:  As directed    Fluid consistency:  Honey Thick   DME Bedside commode   Complete by:  As directed    Patient needs a bedside commode to treat with the following condition:  Recurrent falls   Increase activity slowly   Complete by:  As directed      Discharge Exam: Filed Weights   11/24/17 1507 11/25/17 0500 11/26/17 0300  Weight: 73.9 kg 74.7 kg 74.7 kg   Vitals:   11/27/17 0019 11/27/17 0718  BP: 124/66 128/67  Pulse: 78 79  Resp: 10 14  Temp: 98.1 F (36.7 C) (!) 97.5 F (36.4 C)  SpO2: (!) 87% 91%   General: Appear in no distress, no Rash; Oral Mucosa moist. Cardiovascular: S1 and S2 Present, aortic systolic  Murmur, no JVD Respiratory: Bilateral Air entry present and Clear to Auscultation, no Crackles, no wheezes Abdomen: Bowel Sound present, Soft and no tenderness Extremities: no Pedal edema, no calf  tenderness Neurology: Grossly no focal neuro deficit.  The results of significant diagnostics from this hospitalization (including imaging, microbiology, ancillary and laboratory) are listed below for reference.    Significant Diagnostic Studies: Ct Head Wo Contrast  Result Date: 11/24/2017 CLINICAL DATA:  Altered mental status, fever, generalized weakness EXAM: CT HEAD WITHOUT CONTRAST TECHNIQUE: Contiguous axial images were obtained from the base of the skull through the vertex without intravenous contrast. COMPARISON:  08/01/2016 FINDINGS:  Brain: No evidence of acute infarction, hemorrhage, hydrocephalus, extra-axial collection or mass lesion/mass effect. Encephalomalacic changes in the medial right frontal lobe. Subcortical white matter and periventricular small vessel ischemic changes. Vascular: Intracranial atherosclerosis. Skull: Normal. Negative for fracture or focal lesion. Sinuses/Orbits: The visualized paranasal sinuses are essentially clear. The mastoid air cells are unopacified. Other: None. IMPRESSION: No evidence of acute intracranial abnormality. Encephalomalacic changes in the medial right frontal lobe. Small vessel ischemic changes. Electronically Signed   By: Julian Hy M.D.   On: 11/24/2017 15:42   Dg Chest Port 1 View  Result Date: 11/24/2017 CLINICAL DATA:  Altered level of consciousness EXAM: PORTABLE CHEST 1 VIEW COMPARISON:  08/14/2017 FINDINGS: Mild patchy bilateral lower lung opacities, suspicious for multifocal pneumonia, possibly on the basis of aspiration. No pleural effusion or pneumothorax. Biapical pleural-parenchymal scarring. The heart is normal in size. Left subclavian pacemaker. IMPRESSION: Mild patchy bilateral lower lung opacities, suspicious for multifocal pneumonia, possibly on the basis of aspiration. Electronically Signed   By: Julian Hy M.D.   On: 11/24/2017 15:39   Dg Swallowing Func-speech Pathology  Result Date: 11/26/2017 Objective  Swallowing Evaluation: Type of Study: MBS-Modified Barium Swallow Study  Patient Details Name: JAELON GATLEY MRN: [id number] Date of Birth: Jun 06, 1931 Today's Date: 11/26/2017 Time: SLP Start Time (ACUTE ONLY): 1007 -SLP Stop Time (ACUTE ONLY): 4098 SLP Time Calculation (min) (ACUTE ONLY): 26 min Past Medical History: Past Medical History: Diagnosis Date . Arthritis   "hands" (01/12/2013); feet (podiatry consultation); "back" (06/12/2017) . Atrial flutter ([place])   a. Slow ~100bpm when in 2:1; dx 04/2012. b. Placed on Xarelto.  c. s/p ablation 07-08-2012 by Dr Rayann Heman . Carotid artery calcification   By CXR - dopplers 05/06/12 without obvious evidence of significant ICA stenosis >40% . Colon polyps 06/26/2005 . COPD (chronic obstructive pulmonary disease) ([place])  . Diverticulosis of colon (without mention of hemorrhage)  . Gastritis, chronic   Pt denies bleeding 04/2012. EGD 2012 reportedly normal. . GERD (gastroesophageal reflux disease)  . Hypertension  . Hyponatremia   04/2012 r/t diuretic. . IBS (irritable bowel syndrome)  . Internal and external hemorrhoids without complication  . Macular degeneration of left eye  . Pneumonia   "@ least 5 times" (06/12/2017) . Stroke ([place])  . Tongue cancer ([place]) 02/1997 Past Surgical History: Past Surgical History: Procedure Laterality Date . ATRIAL FLUTTER ABLATION N/A 07/08/2012  Procedure: ATRIAL FLUTTER ABLATION;  Surgeon: Thompson Grayer, MD;  [facility] CATH LAB;  Service: Cardiovascular;  Laterality: N/A; . CARDIAC CATHETERIZATION  04/2012 . CATARACT EXTRACTION W/ INTRAOCULAR LENS IMPLANT Left  . COLONOSCOPY  08/29/10  diverticulosis, internal hemorrhoids . ESOPHAGOGASTRODUODENOSCOPY  09/20/10  normal . [place] . LEFT HEART CATHETERIZATION WITH CORONARY ANGIOGRAM N/A 05/06/2012  Procedure: LEFT HEART CATHETERIZATION WITH CORONARY ANGIOGRAM;  Surgeon: Peter M Martinique, MD;  Location: [facility] CATH LAB;  Service: Cardiovascular;  Laterality: N/A; . Oropharyngeal resection  02/1997  For  tongue cancer . PACEMAKER IMPLANT N/A 08/01/2017  Procedure: PACEMAKER IMPLANT;  Surgeon: Constance Haw, MD;  Location: [place] CV LAB;  Service: Cardiovascular;  Laterality: N/A; . TRIGGER FINGER RELEASE Right 1970's  "2 fingers" . WART FULGURATION Left 09/15/2015  Procedure: EXCISIONal biospy of left peri anual and anual canal mass;  Surgeon: Greer Pickerel, MD;  Location: [facility] ORS;  Service: General;  Laterality: Left; HPI: Pt is a 84 y.o. male with a PMH significant for CAD, A. fib, HTN, COPD, GERD, pneumonia ("at least 5 times" 06/12/2017), stroke (May  2019), and tongue cancer (1998). He presented [facility] ED from home on 11/24/17 with SOB and cough. Per wife, pt has had poor appetite, increasing weakness over last several days, and 2 recent falls (1 fall 11/15; another on 11/16). Head CT revealed no acute intracranial abnormality, but encephalomalacic changes in medial R frontal lobe. CXR showed mild patchy bilateral lower lung opacities, suspicious for multifocal pneumonia.  No data recorded Assessment / Plan / Recommendation CHL IP CLINICAL IMPRESSIONS 11/26/2017 Clinical Impression Pt exhibits mild oral and moderate pharyngeal dysphagia with impairments resulting from multifactors including recent stroke (5/19 with 5 pna episodes since), anatomical difference (suspect cervical spine kyphosis), decreased timing of swallow initiation/laryngeal vestibule closure, mildly reduced stripping wave and questionable UES function. Occasional lingual pumping and lingual residue with honey thick liquids as well as significantly prolonged mastication observed with regular texture. Thin and nectar liquids were penetrated to vocal fold level without sensation primarily prior to swallow initiation indicative of decreased timing of swallow mechanisms. Multiple compensatory strategies (chin tuck, breath hold, head turn to left) attempted with thin and nectar, however none were beneficial to aid in more timely/complete airway  protection. Although moderate vallecular residue was present with honey thick liquids, only one instance of trace penetration above the vocal folds was noted. Verbal cues for second swallows were mildy effective to reduce residue. Intermittently reduced upper esophageal sphincter (UES) relaxation observed with thin and nectar exacerbated by implementation of chin tuck strategy. Suspected reduced pharyngeal stripping wave resulting in pharyngeal/vallecular residue with difficulty clearing. Recommend dysphagia 2 (minced) diet texture and short term use of honey thick liquids and full supervision to ensure upright positioning, small bites/sips, intermittent cough/throat clear throughout PO intake, and extra dry swallows to aid in clearance of vallecular residue. ST will continue to follow to provide treatment with diet safety, efficiency while pt remains in acute setting and potential to upgrade liquid consistency via exercise, RMT. SLP Visit Diagnosis Dysphagia, oropharyngeal phase (R13.12) Attention and concentration deficit following -- Frontal lobe and executive function deficit following -- Impact on safety and function Severe aspiration risk   CHL IP TREATMENT RECOMMENDATION 11/26/2017 Treatment Recommendations Therapy as outlined in treatment plan below   Prognosis 11/26/2017 Prognosis for Safe Diet Advancement Fair Barriers to Reach Goals -- Barriers/Prognosis Comment -- CHL IP DIET RECOMMENDATION 11/26/2017 SLP Diet Recommendations Dysphagia 2 (Fine chop) solids;Honey thick liquids Liquid Administration via Cup Medication Administration Crushed with puree Compensations Slow rate;Small sips/bites;Clear throat intermittently;Multiple dry swallows after each bite/sip Postural Changes Seated upright at 90 degrees   CHL IP OTHER RECOMMENDATIONS 11/26/2017 Recommended Consults -- Oral Care Recommendations Oral care BID Other Recommendations --   CHL IP FOLLOW UP RECOMMENDATIONS 11/26/2017 Follow up Recommendations  Home health SLP   CHL IP FREQUENCY AND DURATION 11/26/2017 Speech Therapy Frequency (ACUTE ONLY) min 2x/week Treatment Duration 2 weeks      CHL IP ORAL PHASE 11/26/2017 Oral Phase Impaired Oral - Pudding Teaspoon -- Oral - Pudding Cup -- Oral - Honey Teaspoon -- Oral - Honey Cup Lingual/palatal residue Oral - Nectar Teaspoon -- Oral - Nectar Cup WFL Oral - Nectar Straw -- Oral - Thin Teaspoon -- Oral - Thin Cup WFL Oral - Thin Straw -- Oral - Puree Lingual pumping Oral - Mech Soft -- Oral - Regular Lingual pumping;Impaired mastication Oral - Multi-Consistency -- Oral - Pill -- Oral Phase - Comment --  CHL IP PHARYNGEAL PHASE 11/26/2017 Pharyngeal Phase Impaired Pharyngeal- Pudding Teaspoon -- Pharyngeal -- Pharyngeal- Pudding Cup --  Pharyngeal -- Pharyngeal- Honey Teaspoon -- Pharyngeal -- Pharyngeal- Honey Cup Pharyngeal residue - pyriform;Pharyngeal residue - valleculae;Penetration/Aspiration during swallow;Reduced pharyngeal peristalsis Pharyngeal Material enters airway, remains ABOVE vocal cords and not ejected out Pharyngeal- Nectar Teaspoon -- Pharyngeal -- Pharyngeal- Nectar Cup Penetration/Aspiration before swallow;Reduced pharyngeal peristalsis;Pharyngeal residue - valleculae;Pharyngeal residue - pyriform;Compensatory strategies attempted (with notebox) Pharyngeal Material enters airway, CONTACTS cords and not ejected out Pharyngeal- Nectar Straw -- Pharyngeal -- Pharyngeal- Thin Teaspoon -- Pharyngeal -- Pharyngeal- Thin Cup Pharyngeal residue - valleculae;Reduced pharyngeal peristalsis;Penetration/Aspiration before swallow;Penetration/Aspiration during swallow;Compensatory strategies attempted (with notebox) Pharyngeal Material enters airway, CONTACTS cords and not ejected out Pharyngeal- Thin Straw -- Pharyngeal -- Pharyngeal- Puree Pharyngeal residue - valleculae Pharyngeal -- Pharyngeal- Mechanical Soft -- Pharyngeal -- Pharyngeal- Regular Pharyngeal residue - valleculae Pharyngeal -- Pharyngeal-  Multi-consistency -- Pharyngeal -- Pharyngeal- Pill -- Pharyngeal -- Pharyngeal Comment --  CHL IP CERVICAL ESOPHAGEAL PHASE 11/26/2017 Cervical Esophageal Phase Impaired Pudding Teaspoon -- Pudding Cup -- Honey Teaspoon -- Honey Cup WFL Nectar Teaspoon -- Nectar Cup Reduced cricopharyngeal relaxation Nectar Straw -- Thin Teaspoon -- Thin Cup Reduced cricopharyngeal relaxation;Other (Comment) Thin Straw -- Puree WFL Mechanical Soft -- Regular WFL Multi-consistency -- Pill -- Cervical Esophageal Comment (No Data) [place] Siren 11/26/2017, 1:39 PM Orbie Pyo Colvin Caroli.Ed Actor Pager [phone] Office [phone]               Microbiology: Recent Results (from the past 240 hour(s))  Urine culture     Status: Abnormal   Collection Time: 11/24/17  2:49 PM  Result Value Ref Range Status   Specimen Description URINE, RANDOM  Final   Special Requests unknown Normal  Final   Culture (A)  Final    <10,000 COLONIES/mL INSIGNIFICANT GROWTH Performed at [facility], [place] [address] [place], [place] [address]    Report Status 11/25/2017 FINAL  Final  Culture, blood (routine x 2)     Status: None (Preliminary result)   Collection Time: 11/24/17  4:02 PM  Result Value Ref Range Status   Specimen Description BLOOD LEFT ANTECUBITAL  Final   Special Requests   Final    BOTTLES DRAWN AEROBIC AND ANAEROBIC Blood Culture adequate volume   Culture   Final    NO GROWTH 3 DAYS Performed at [facility], [place] [address], [place], [place] [address]    Report Status PENDING  Incomplete  Culture, blood (routine x 2)     Status: None (Preliminary result)   Collection Time: 11/24/17  4:04 PM  Result Value Ref Range Status   Specimen Description BLOOD BLOOD LEFT ARM  Final   Special Requests   Final    BOTTLES DRAWN AEROBIC AND ANAEROBIC Blood Culture adequate volume   Culture   Final    NO GROWTH 3 DAYS Performed at [facility], [place] [address]  [place], [place] [address]    Report Status PENDING  Incomplete  MRSA PCR Screening     Status: None   Collection Time: 11/24/17  7:30 PM  Result Value Ref Range Status   MRSA by PCR NEGATIVE NEGATIVE Final    Comment:        The GeneXpert MRSA Assay (FDA approved for NASAL specimens only), is one component of a comprehensive MRSA colonization surveillance program. It is not intended to diagnose MRSA infection nor to guide or monitor treatment for MRSA infections. Performed at [facility], [place] [address], [place], [place] [address]  Labs: CBC: Recent Labs  Lab 11/24/17 1515 11/25/17 0630 11/26/17 0308 11/27/17 0804  WBC 7.7 18.4* 14.7* 9.2  NEUTROABS 5.1  --   --  8.4*  HGB 12.0* 10.0* 9.1* 9.0*  HCT 40.7 30.6* 28.4* 27.5*  MCV 89.8 85.7 85.3 84.6  PLT 255 174 177 802   Basic Metabolic Panel: Recent Labs  Lab 11/24/17 1515 11/25/17 0630 11/26/17 0308 11/27/17 0804  NA 136 137 139 143  K 5.0 4.2 3.8 3.2*  CL 102 106 109 111  CO2 16* 26 22 27   GLUCOSE 214* 79 60* 86  BUN 18 20 20 14   CREATININE 1.90* 1.53* 1.34* 1.32*  CALCIUM 8.4* 7.7* 7.7* 8.1*   Liver Function Tests: Recent Labs  Lab 11/24/17 1515  AST 66*  ALT 26  ALKPHOS 98  BILITOT 0.8  PROT 6.2*  ALBUMIN 2.4*   No results for input(s): LIPASE, AMYLASE in the last 168 hours. No results for input(s): AMMONIA in the last 168 hours. Cardiac Enzymes: Recent Labs  Lab 11/24/17 1910 11/25/17 0248 11/25/17 0630  TROPONINI 0.09* 0.07* 0.06*   BNP (last 3 results) Recent Labs    06/12/17 0235 08/01/17 1253 11/24/17 1910  BNP 1,385.5* 475.0* 352.9*   CBG: Recent Labs  Lab 11/24/17 1537  GLUCAP 117*   Time spent: 35 minutes  Signed:  Berle Mull  [facility]  11/27/2017  , 3:51 PM

## 2017-11-27 NOTE — Evaluation (Signed)
Occupational Therapy Evaluation Patient Details Name: Chad [name] MRN: [id number] DOB: 07-05-1931 Today's Date: 11/27/2017    History of Present Illness 84 year old male with history of COPD, CAD, paroxysmal atrial fibrillation, h/o tongue CA status post surgery in 2000 admitted to the ICU on 11/17 with acute hypoxic respiratory failure, sepsis, pneumonia and A. fib RVR.   Clinical Impression   Pt is eager to go home today. Currently functioning at a set up to min guard assist level in ADL. Sits to shower and is agreeable to use of 3 in 1 or urinal at night. Will defer further OT to [place].    Follow Up Recommendations  Home health OT;Supervision/Assistance - 24 hour    Equipment Recommendations  3 in 1 bedside commode    Recommendations for Other Services       Precautions / Restrictions Precautions Precautions: Fall Restrictions Weight Bearing Restrictions: No      Mobility Bed Mobility Overal bed mobility: Modified Independent             General bed mobility comments: HOB up  Transfers Overall transfer level: Needs assistance Equipment used: Rolling walker (2 wheeled) Transfers: Sit to/from Stand Sit to Stand: Min guard         General transfer comment: increased time and effort    Balance                                           ADL either performed or assessed with clinical judgement   ADL Overall ADL's : Needs assistance/impaired Eating/Feeding: Independent;Sitting   Grooming: Wash/dry hands;Standing;Min guard   Upper Body Bathing: Set up;Supervision/ safety;Sitting   Lower Body Bathing: Set up;Sit to/from stand;Min guard   Upper Body Dressing : Set up;Sitting   Lower Body Dressing: Sit to/from stand;Min guard   Toilet Transfer: Ambulation;RW;Regular Toilet;Grab bars;Min guard   Toileting- Clothing Manipulation and Hygiene: Sit to/from stand;Min guard       Functional mobility during ADLs: Rolling walker;Min  guard General ADL Comments: recommended use of urinal or BSC at night, pt has a touch lamp he can use beside his bed     Vision Patient Visual Report: No change from baseline       Perception     Praxis      Pertinent Vitals/Pain Pain Assessment: No/denies pain     Hand Dominance Right   Extremity/Trunk Assessment Upper Extremity Assessment Upper Extremity Assessment: Overall WFL for tasks assessed   Lower Extremity Assessment Lower Extremity Assessment: Defer to PT evaluation   Cervical / Trunk Assessment Cervical / Trunk Assessment: Kyphotic   Communication Communication Communication: HOH   Cognition Arousal/Alertness: Awake/alert Behavior During Therapy: WFL for tasks assessed/performed Overall Cognitive Status: Within Functional Limits for tasks assessed                                     General Comments  discssion wtih family and patient over safety considerartions for home    Exercises     Shoulder Instructions      Home Living Family/patient expects to be discharged to:: Private residence Living Arrangements: Spouse/significant other Available Help at Discharge: Family;Friend(s);Available PRN/intermittently Type of Home: House Home Access: Ramped entrance     Home Layout: One level     Bathroom Shower/Tub: Hospital doctor  Toilet: Standard     Home Equipment: Shower seat - built in;Cane - single point;Grab bars - toilet;Walker - 2 wheels   Additional Comments: uses RW at night to bathroom where most falls occur      Prior Functioning/Environment Level of Independence: Independent with assistive device(s)  Gait / Transfers Assistance Needed: RW at home w/c for community              OT Problem List: Impaired balance (sitting and/or standing)      OT Treatment/Interventions:      OT Goals(Current goals can be found in the care plan section) Acute Rehab OT Goals Patient Stated Goal: go home  OT Frequency:      Barriers to D/C:            Co-evaluation              AM-PAC PT "6 Clicks" Daily Activity     Outcome Measure Help from another person eating meals?: None Help from another person taking care of personal grooming?: A Little Help from another person toileting, which includes using toliet, bedpan, or urinal?: A Little Help from another person bathing (including washing, rinsing, drying)?: A Little Help from another person to put on and taking off regular upper body clothing?: None Help from another person to put on and taking off regular lower body clothing?: A Little 6 Click Score: 20   End of Session Equipment Utilized During Treatment: Gait belt;Rolling walker  Activity Tolerance: Patient tolerated treatment well Patient left: in bed;with call bell/phone within reach;with family/visitor present;with bed alarm set  OT Visit Diagnosis: Other abnormalities of gait and mobility (R26.89);History of falling (Z91.81)                Time: 5537-4827 OT Time Calculation (min): 30 min Charges:  OT General Charges $OT Visit: 1 Visit OT Evaluation $OT Eval Moderate Complexity: 1 Mod OT Treatments $Self Care/Home Management : 8-22 mins  Chad [name], Chad [name] Acute Rehabilitation Services Pager: [phone] Office: [phone]  Chad [name] 11/27/2017, 2:40 PM

## 2017-11-27 NOTE — Care Management Note (Addendum)
Case Management Note  Patient Details  Name: Chad [name] MRN: [id number] Date of Birth: 02-20-1931  Subjective/Objective:   From home with spouse, active with [facility] for [facility], HHPT, presents with acute resp failure, sepssis, pna, ? Asp, wa on bipap earlier, MBS - dysph 2 honey thick .  NCM contacted Butch Penny with [facility] , notified for dc today and that we need to add HHOT, HHST to services.                Action/Plan: NCM will follow for transition of care needs.   Expected Discharge Date:                  Expected Discharge Plan:  [place]  In-House Referral:     Discharge planning Services  CM Consult  Post Acute Care Choice:  Resumption of Svcs/PTA Provider Choice offered to:     DME Arranged:    DME Agency:     HH Arranged:  RN, PT, OT, ST HH Agency:  [place]  Status of Service:  In process, will continue to follow  If discussed at Long Length of Stay Meetings, dates discussed:    Additional Comments:  Zenon Mayo, RN 11/27/2017, 10:57 AM

## 2017-11-27 NOTE — Progress Notes (Signed)
TOC meds brought for pt.  BSC brought for patient to take home.  Discharge instructions given to pt and their sons.  Denies questions.  IV removed with cannula intact and hemostasis achieved.

## 2017-11-27 NOTE — Discharge Instructions (Signed)
Dysphagia Diet Level 2, Mechanically Altered °The dysphagia level 2 diet includes foods that are blended, chopped, ground, or mashed so they are easier to chew and swallow. The foods are soft, moist, and can be chopped into ¼-inch chunks (such as pancakes, pasta, and bananas). °In order to be on this diet, you must be able to chew. This diet helps you transition between the pureed textures of the dysphagia level 1 diet to more solid textures. This diet is helpful for people with mild to moderate swallowing difficulties. It reduces the risk of food getting caught in the windpipe, trachea, or lungs. °You may need help or supervision during meals while following this diet so that you eat safely. You will be on this diet until your health care provider advances the texture of your diet. °What do I need to know about this diet? °Foods °· You may eat foods that are soft and moist. °? You may need to use a blender, whisk, or masher to soften some of your foods. °? You can moisten foods with gravies, sauces, vegetable or fruit juice, milk, half and half, or water when blending, mashing, or grinding your foods to the right consistency. °· If you were on the dysphagia level 1 diet, you may still eat any of the foods included in that diet. °· Avoid foods that are dry, hard, sticky, chewy, coarse, and crunchy. Also avoid large cuts of food. °· Take small bites. Each bite should contain ¼ inch or less of food. °Liquids °· Avoid liquids with seeds and chunks. °· Thicken liquids, if instructed by your health care provider. Your health care provider will tell you the consistency to which you should thicken your liquids for safe swallowing. To thicken a liquid, use a commercial thickener or a thickening food (such as rice cereal or potato flakes). Ask your health care provider for specific recommendations on thickeners. °See your dietitian or health care provider regularly for help with your dietary changes. °What foods can I  eat? °Grains °Store-bought soft breads that do not have nuts or seeds. Pancakes, sweet rolls, Danish pastries, and French toast that have been moistened with syrup or sauce to form a slurry when blended. Well-cooked pasta, noodles, and bread dressing. Well-cooked noodles and pasta in sauce. Moist macaroni and cheese. Soft dumplings or spaetzle with gravy or butter. Cooked cereals (including oatmeal). Low-texture dry cereals, such as rice puff, corn, or wheat-flake cereals, with milk (if thin liquids are not allowed, make sure all of the milk is absorbed by the cereal before eating it). °Vegetables °Very soft, well-cooked vegetables in pieces less than ½ inch in size. Cooked potatoes that are moist, not crispy, and with sauce. °Fruits °Canned or cooked fruits that are soft or moist and do not have skin or seeds. Fresh, soft bananas. Fruit juices with a small amount of pulp (if thin liquids are allowed). Gelatin or plain gelatin with canned fruit, except pineapple. °Meat and Other Protein Sources °Tender, moist meats, poultry, or fish cooked with gravy or sauce and cubed to ¼-inch bites or smaller. Ground meat. Moist meatball or meatloaf. Fish without bones. Moist casseroles without rice. Tuna, egg, or meat salad without chunks or hard-to-chew vegetables, such as celery and onions. Smooth quiche without large chunks. Scrambled, poached, or soft-cooked eggs with butter, margarine, sauce, or gravy. Tofu. Well-cooked, moistened and mashed beans, peas, baked beans, and other legumes. Casseroles without rice (such as tuna noodle casserole or soft moist meat lasagna). °Dairy °Cream cheese. Yogurt. Cottage   cheese. Ask your health care provider if milk is allowed. °Sweets/Desserts °Pudding. Custard. Soft fruit pies with crust on the bottom only. Crisps and cobblers without seeds or nuts and with soft crusts. Soft, moist cakes. Icing. Pre-gelled cookies. Soft, moist cookies dunked in milk, coffee, or another liquid. Jelly.  Soft, smooth chocolate bars that are easily chewed. Jams and preserves without seeds. Ask your health care provider whether you can have frozen desserts. °Fats and Oils °Butter. Margarine. Cream for cereal, depending on liquid consistency allowed. Gravy. Cream sauces. Mayonnaise. Salad dressings. Cream cheese. Cheese spreads, plain or with soft fruits or vegetables added. Sour cream. Sour cream dips with soft fruits or vegetables added. Whipped toppings. °Other °Sauces and salsas that have soft chunks that are about ½ inch or smaller. °The items listed above may not be a complete list of recommended foods or beverages. Contact your dietitian for more options. °What foods are not recommended? °Grains °All breads not listed in the recommended list. Breads that are hard or have nuts or seeds. Coarse cereals. Cereals that have nuts, seeds, dried fruits, or coconut. Rice. Corn. °Vegetables °Whole, raw, frozen, or dried vegetables. Tough, fibrous, chewy, or stringy cooked vegetables, such as celery, peas, broccoli, cabbage, Brussels sprouts, and asparagus. Potato skins. Potato and other vegetable chips. Fried or French-fried potatoes. Cooked corn and peas. °Fruits °Whole raw, frozen, or dried fruits, including coconut. Pineapple. Fruits with seeds. °Meat and Other Protein Sources °Dry, tough meats, such as bacon, sausage, and hot dogs. Cheese slices and cubes. Peanut butter. Hard boiled or fried eggs. Nuts. Seeds. Pizza. Sandwiches. Dry casseroles or casseroles with rice or large chunks. °Dairy °Yogurt with nuts, seeds, or large chunks. °Sweets/Desserts °Coarse, hard, chewy, or sticky desserts. Any dessert with nuts, seeds, coconut, pineapple, or dried fruit. Ask your health care provider whether you can have frozen desserts. °Fats and Oils °Avoid fats with chunky, large textures, such as those with nuts or fruits. °Other °Soups and casseroles with large chunks. °The items listed above may not be a complete list of foods  and beverages to avoid. Contact your dietitian for more information. °This information is not intended to replace advice given to you by your health care provider. Make sure you discuss any questions you have with your health care provider. °Document Released: 12/25/2004 Document Revised: 06/02/2015 Document Reviewed: 12/08/2012 °Elsevier Interactive Patient Education © 2018 Elsevier Inc. ° ° ° °

## 2017-11-27 NOTE — Progress Notes (Signed)
OT Cancellation Note  Patient Details Name: [name] MRN: [id number] DOB: Aug 01, 1931   Cancelled Treatment:     Reason Pt not seen/Evaluation not complete: Orders received and chart reviewed for OT Assessment. Chart review indicates critical Troponin lab values w/ arrows trending upward, will therefore hold OT assessment until later date when pt is more medically appropriate/able to participate.  [name]  11/27/2017, 10:23 AM

## 2017-11-27 NOTE — Evaluation (Signed)
Physical Therapy Evaluation Patient Details Name: Chad [name] MRN: [id number] DOB: 16-Dec-1931 Today's Date: 11/27/2017   History of Present Illness  84 year old male with history of COPD, CAD, paroxysmal atrial fibrillation, h/o tongue CA status post surgery in 2000 admitted to the ICU on 11/17 with acute hypoxic respiratory failure, sepsis, pneumonia and A. fib RVR.    Clinical Impression  Pt admitted with above diagnosis. Pt currently with functional limitations due to the deficits listed below (see PT Problem List). PTA pt living at home receiving HHPT, living with wife and ambulating with RW. Pt reports history of falls in last sveral months, mainly going to bathroom at night. Discussed safety considerations and use of BSC at night, importance to have wife supervise his OOB mobility. Family present agreeable. Pt supervision level for mobility, due to balance and deconditioning rec HHPT as he presents as fall risk. Pt will benefit from skilled PT to increase their independence and safety with mobility to allow discharge to the venue listed below.       Follow Up Recommendations Home health PT;Supervision for mobility/OOB    Equipment Recommendations  3in1 (PT)    Recommendations for Other Services       Precautions / Restrictions Precautions Precautions: Fall Restrictions Weight Bearing Restrictions: No      Mobility  Bed Mobility Overal bed mobility: Modified Independent                Transfers Overall transfer level: Needs assistance   Transfers: Sit to/from Stand Sit to Stand: Min assist         General transfer comment: increased time and effort cues for hand position on RW  Ambulation/Gait Ambulation/Gait assistance: Min guard;Supervision Gait Distance (Feet): 50 Feet Assistive device: Rolling walker (2 wheeled) Gait Pattern/deviations: Step-through pattern Gait velocity: decreased   General Gait Details: pt with unsteady gait  Stairs             Wheelchair Mobility    Modified Rankin (Stroke Patients Only)       Balance                                             Pertinent Vitals/Pain Pain Assessment: No/denies pain    Home Living Family/patient expects to be discharged to:: Private residence Living Arrangements: Spouse/significant other Available Help at Discharge: Family;Friend(s);Available PRN/intermittently Type of Home: House Home Access: Ramped entrance     Home Layout: One level Home Equipment: Shower seat - built in;Cane - single point;Grab bars - toilet Additional Comments: used cane outdoors onlly.     Prior Function Level of Independence: Independent;Independent with assistive device(s);Needs assistance   Gait / Transfers Assistance Needed: RW at home w/c for community           Hand Dominance   Dominant Hand: Right    Extremity/Trunk Assessment   Upper Extremity Assessment Upper Extremity Assessment: Defer to OT evaluation    Lower Extremity Assessment Lower Extremity Assessment: Overall WFL for tasks assessed    Cervical / Trunk Assessment Cervical / Trunk Assessment: Kyphotic  Communication   Communication: HOH  Cognition Arousal/Alertness: Awake/alert Behavior During Therapy: WFL for tasks assessed/performed Overall Cognitive Status: Within Functional Limits for tasks assessed  General Comments General comments (skin integrity, edema, etc.): discssion wtih family and patient over safety considerartions for home    Exercises     Assessment/Plan    PT Assessment Patient needs continued PT services  PT Problem List         PT Treatment Interventions Gait training;DME instruction;Functional mobility training;Therapeutic activities;Therapeutic exercise    PT Goals (Current goals can be found in the Care Plan section)  Acute Rehab PT Goals Patient Stated Goal: go home PT Goal Formulation:  With patient/family Time For Goal Achievement: 12/11/17 Potential to Achieve Goals: Fair    Frequency Min 3X/week   Barriers to discharge        Co-evaluation               AM-PAC PT "6 Clicks" Daily Activity  Outcome Measure Difficulty turning over in bed (including adjusting bedclothes, sheets and blankets)?: None Difficulty moving from lying on back to sitting on the side of the bed? : A Little Difficulty sitting down on and standing up from a chair with arms (e.g., wheelchair, bedside commode, etc,.)?: A Little Help needed moving to and from a bed to chair (including a wheelchair)?: A Little Help needed walking in hospital room?: A Little Help needed climbing 3-5 steps with a railing? : A Little 6 Click Score: 19    End of Session Equipment Utilized During Treatment: Gait belt Activity Tolerance: Patient tolerated treatment well Patient left: in bed   PT Visit Diagnosis: Unsteadiness on feet (R26.81)    Time: 9833-8250 PT Time Calculation (min) (ACUTE ONLY): 27 min   Charges:   PT Evaluation $PT Eval Low Complexity: 1 Low PT Treatments $Gait Training: 8-22 mins        Reinaldo Berber, PT, DPT Acute Rehabilitation Services Pager: [phone] Office: [phone]    Reinaldo Berber 11/27/2017, 2:26 PM

## 2017-11-28 ENCOUNTER — Other Ambulatory Visit: Payer: Self-pay

## 2017-11-28 ENCOUNTER — Telehealth: Payer: Self-pay | Admitting: *Deleted

## 2017-11-28 DIAGNOSIS — H353211 Exudative age-related macular degeneration, right eye, with active choroidal neovascularization: Secondary | ICD-10-CM | POA: Diagnosis not present

## 2017-11-28 NOTE — Telephone Encounter (Signed)
Transition Care Management Follow-up Telephone Call   Date discharged? 11/27/17   How have you been since you were released from the hospital? Pt states he is doing alright   Do you understand why you were in the hospital? YES   Do you understand the discharge instructions? YES   Where were you discharged to? Home   Items Reviewed:  Medications reviewed: YES  Allergies reviewed: YES  Dietary changes reviewed: YES, heart healthy  Referrals reviewed: YES, [facility] states [facility] out today   Functional Questionnaire:   Activities of Daily Living (ADLs):   He states he are independent in the following: bathing and hygiene, feeding, continence, grooming, toileting and dressing States they require assistance with the following: ambulation   Any transportation issues/concerns?: YES   Any patient concerns? NO   Confirmed importance and date/time of follow-up visits scheduled YES, appt 12/02/17  Provider Appointment booked with [name]  Confirmed with patient if condition begins to worsen call PCP or go to the ER.  Patient was given the office number and encouraged to call back with question or concerns.  : YES

## 2017-11-28 NOTE — Patient Outreach (Signed)
New referral: Discharged from [facility] on 11/27/2017.  MD office does transition of care.  Patient reports home health nurse and speech is coming tomorrow. Reports that he has follow up with primary MD on 12/02/2017.   Patient reports that he is feeling well.  Explained reason for call and offered home visit for assessment of needs. Patient has agreed to home visit on 12/03/2017. Confirmed address and provided my contact information.  PLAN: home visit on 12/03/2017  Tomasa Rand, RN, BSN, CEN [facility] [employer] [phone]

## 2017-11-29 ENCOUNTER — Telehealth: Payer: Self-pay | Admitting: Internal Medicine

## 2017-11-29 ENCOUNTER — Other Ambulatory Visit: Payer: Self-pay

## 2017-11-29 DIAGNOSIS — F329 Major depressive disorder, single episode, unspecified: Secondary | ICD-10-CM | POA: Diagnosis not present

## 2017-11-29 DIAGNOSIS — I5032 Chronic diastolic (congestive) heart failure: Secondary | ICD-10-CM | POA: Diagnosis not present

## 2017-11-29 DIAGNOSIS — I130 Hypertensive heart and chronic kidney disease with heart failure and stage 1 through stage 4 chronic kidney disease, or unspecified chronic kidney disease: Secondary | ICD-10-CM | POA: Diagnosis not present

## 2017-11-29 DIAGNOSIS — J449 Chronic obstructive pulmonary disease, unspecified: Secondary | ICD-10-CM | POA: Diagnosis not present

## 2017-11-29 DIAGNOSIS — N184 Chronic kidney disease, stage 4 (severe): Secondary | ICD-10-CM | POA: Diagnosis not present

## 2017-11-29 DIAGNOSIS — H548 Legal blindness, as defined in USA: Secondary | ICD-10-CM | POA: Diagnosis not present

## 2017-11-29 LAB — CULTURE, BLOOD (ROUTINE X 2)
Culture: NO GROWTH
Culture: NO GROWTH
SPECIAL REQUESTS: ADEQUATE
SPECIAL REQUESTS: ADEQUATE

## 2017-11-29 NOTE — Patient Outreach (Signed)
[facility]) Care Management  11/29/2017  Chad [name] [birth year] [id number]   Initial outreach to patient regarding social work referral.  Per referral, patient's ex wife inquired about whether or not the [employer] will assist with purchase of incontinent supplies or construction of ramp at their home.  At the time of the call Mr. Lard was at the [place] for the appointment.  BSW encouraged him to speak with a social worker there because they will have more knowledge about available supplies and services through the [place] informed him of other community agencies that can assist with construction of ramp if [facility] is unable to do so.  BSW is closing case at this time but encouraged patient to call if [facility] is unable to provide assistance. In-basket message sent to [facility], Tomasa Rand, informing her of this information.  Ronn Melena, BSW Social Worker [phone]

## 2017-11-29 NOTE — Telephone Encounter (Signed)
Copied from [place] [id number]. Topic: Quick Communication - Home Health Verbal Orders >> Nov 29, 2017  3:12 PM Yvette Rack wrote: Caller/Agency:  Sonia Baller speech therapist [facility] [phone] Callback Number: [phone] Requesting OT/PT/Skilled Nursing/Social Work: Speech therapy  Frequency: once a week for 4 weeks due to swallowing difficulties

## 2017-11-29 NOTE — Telephone Encounter (Signed)
Copied from [place] [facility] [place] [id number]. Topic: Quick Communication - Home Health Verbal Orders >> Nov 29, 2017 10:45 AM Blase Mess A wrote: Caller/Agency: [facility] Number: [phone] Requesting OT/PT/Skilled Nursing/Social Work: Calling to resuming skilled nursing until 12/21/17 Frequency: 1 week 1, 2 week 2, 1 week 1 Also requesting order for [facility] lift and over the bedside table Also reporting a temperature of 96.3 Please advis

## 2017-12-02 ENCOUNTER — Telehealth: Payer: Self-pay | Admitting: Internal Medicine

## 2017-12-02 ENCOUNTER — Ambulatory Visit (INDEPENDENT_AMBULATORY_CARE_PROVIDER_SITE_OTHER): Payer: Medicare Other | Admitting: Internal Medicine

## 2017-12-02 ENCOUNTER — Encounter: Payer: Self-pay | Admitting: Internal Medicine

## 2017-12-02 DIAGNOSIS — Z66 Do not resuscitate: Secondary | ICD-10-CM | POA: Insufficient documentation

## 2017-12-02 DIAGNOSIS — I639 Cerebral infarction, unspecified: Secondary | ICD-10-CM

## 2017-12-02 DIAGNOSIS — R627 Adult failure to thrive: Secondary | ICD-10-CM

## 2017-12-02 DIAGNOSIS — N184 Chronic kidney disease, stage 4 (severe): Secondary | ICD-10-CM | POA: Diagnosis not present

## 2017-12-02 DIAGNOSIS — I10 Essential (primary) hypertension: Secondary | ICD-10-CM | POA: Diagnosis not present

## 2017-12-02 DIAGNOSIS — K429 Umbilical hernia without obstruction or gangrene: Secondary | ICD-10-CM | POA: Diagnosis not present

## 2017-12-02 DIAGNOSIS — R4781 Slurred speech: Secondary | ICD-10-CM | POA: Diagnosis not present

## 2017-12-02 DIAGNOSIS — I2510 Atherosclerotic heart disease of native coronary artery without angina pectoris: Secondary | ICD-10-CM | POA: Diagnosis not present

## 2017-12-02 DIAGNOSIS — R1310 Dysphagia, unspecified: Secondary | ICD-10-CM

## 2017-12-02 NOTE — Telephone Encounter (Signed)
Copied from [place] [phone]. Topic: Quick Communication - Home Health Verbal Orders >> Dec 02, 2017  8:59 AM Blase Mess A wrote: Caller/Agency: [place] Number: [phone] to leave message on VM Requesting OT/PT/Skilled Nursing/Social Work: Resuming medication mangagment, COF. Monitor for adverse signs and systems, safety assessment -Also requesting a hospital bed and a hydrolic lift.  Frequency: until 12/21/17 2 week 2, 1 week 2

## 2017-12-02 NOTE — Assessment & Plan Note (Signed)
Monitor labs 

## 2017-12-02 NOTE — Assessment & Plan Note (Signed)
dysphagia 2 diet honey thick liquid

## 2017-12-02 NOTE — Assessment & Plan Note (Signed)
Poss recent small CVA: slurred speech Eliquis

## 2017-12-02 NOTE — Assessment & Plan Note (Signed)
No sx's now I suggested to postpone surgery

## 2017-12-02 NOTE — Telephone Encounter (Signed)
FYI verbals given

## 2017-12-02 NOTE — Assessment & Plan Note (Signed)
Per cardiology Eliquis, Amlodipine

## 2017-12-02 NOTE — Assessment & Plan Note (Signed)
We discussed his complex problems

## 2017-12-02 NOTE — Assessment & Plan Note (Signed)
Discussed and confirmed 12/02/17

## 2017-12-02 NOTE — Assessment & Plan Note (Signed)
Norvasc

## 2017-12-02 NOTE — Telephone Encounter (Signed)
Ok Thx 

## 2017-12-02 NOTE — Progress Notes (Signed)
Subjective:  Patient ID: Chad [name], male    DOB: [birth year]  Age: 84 y.o. MRN: [id number]  CC: No chief complaint on file.   HPI CORDAI RODRIGUE presents for aspiration pneumonia and sepsis f/u. C/o FTT: hard to walk, slurred speech. On soft diet w/thickener. The pt was d/c on 11/20. PT at home. Per hx: "84 y/o malewith a past medical history significant for coronary artery disease, A. fib, and COPD came to the [facility] emergency department on November 24, 2017 complaining of shortness of breath and cough. His family provides most of the history because he was confused and on BiPAP. His wife says that for the last several days he has been feeling more weak, and has had a poor appetite. He has chronic loose stools which is not changed, she denied any nausea or vomiting or complaints of abdominal pain. However, she noted increasing weakness and he had 2 falls. The first was on Friday night, the second was yesterday. She denied any head trauma. In the week prior he had been walking with his walker and had been in his usual state of health. On the morning of November 17 because of increasing shortness of breath and cough with mucus production he asked his wife to move into the emergency room. She said at first he thought he was choking because she did not think he was that sick but then he insisted so she brought him to the ER. Here he has been found to be markedly hypoxemic and have labs consistent with sepsis. He was given IV fluids, antibiotics, and noninvasive mechanical ventilation and pulmonary and critical care medicine was asked to admit."  Hospital Course:  Summary of his active problems in the hospital is as following. Acute respiratory failure with hypoxemia Sepsis secondary to acute aspiration pneumonia. Febrile, Tachycardic, tachypneic as well as hypoxic on admission. -Long history suggestive of aspiration, remote tongue CA and surgery -Treated with BiPAP and  ceftriaxone/azithromycin in ICU -Clinically stable, off BiPAP transition to oral Augmentin suspect aspiration is likely inciting event -SLP consulted, recommend dysphagia 2diet honey thick liquid. -Now on room air. Continue home health PT OT as well as SLP.  Acute kidney injury --Improving, down to 1.3, stop IV fluids  ParoxysmalAtrial fibrillation with RVR  -HR improved, off cardizem -likely triggered by pneumonia, hypoxemia -continue Eliquis  GERD -continue home PPI/H2 blocker  RemoteHistory of throat Ca -SLP eval as above,   Dyslipidemia. Continue Lipitor.  Recurrent falls at home. Recommend bedside commode. Also recommend PCP to discuss utility of antiregulation in a patient who has recurrent falls at home. Patient is on gabapentin as well as.  Recommend PCP to down titrate this medications as well.  Nutrition Problem: Increased nutrient needs Etiology: acute illness, chronic illness Nutrition Interventions: Interventions: Ensure Enlive (each supplement provides 350kcal and 20 grams of protein)  All other chronic medical condition were stable during the hospitalization.  Patient was seen by physical therapy, who recommended home health, which was arranged by Education officer, museum and case Freight forwarder. On the day of the discharge the patient's vitals were stable , and no other acute medical condition were reported by patient. the patient was felt safe to be discharge at home with home health."  Outpatient Medications Prior to Visit  Medication Sig Dispense Refill  . apixaban (ELIQUIS) 2.5 MG TABS tablet Take 1 tablet (2.5 mg total) by mouth 2 (two) times daily. Resume 7/28/19PM 180 tablet 1  . atorvastatin (LIPITOR) 40 MG tablet Take  1 tablet (40 mg total) by mouth daily at 6 PM. 90 tablet 1  . cholecalciferol (VITAMIN D) 1000 units tablet Take 1 tablet (1,000 Units total) by mouth daily. 100 tablet 3  . clidinium-chlordiazePOXIDE (LIBRAX) 5-2.5 MG capsule Take 1 capsule  by mouth 2 (two) times daily. 180 capsule 1  . cyanocobalamin 1000 MCG tablet Take 1,000 mcg by mouth daily.     [name] escitalopram (LEXAPRO) 10 MG tablet     . famotidine (PEPCID) 20 MG tablet Take 1 tablet (20 mg total) by mouth at bedtime. 90 tablet 1  . feeding supplement, ENSURE ENLIVE, (ENSURE ENLIVE) LIQD Take 237 mLs by mouth 2 (two) times daily between meals. 237 mL 12  . fluticasone (FLONASE) 50 MCG/ACT nasal spray USE 2 SPRAYS IN EACH NOSTRIL DAILY (Patient taking differently: Place 2 sprays into both nostrils as needed for allergies. ) 48 g 3  . gabapentin (NEURONTIN) 300 MG capsule Take 3 capsules at bedtime. (Patient taking differently: Take 900 mg by mouth at bedtime. ) 270 capsule 3  . ipratropium (ATROVENT) 0.02 % nebulizer solution Take 2.5 mLs (0.5 mg total) by nebulization 3 (three) times daily. Via HHN 75 mL 12  . Maltodextrin-Xanthan Gum (RESOURCE THICKENUP CLEAR) POWD Take 1 g by mouth as needed. 125 g 0  . mirtazapine (REMERON) 15 MG tablet     . multivitamin-lutein (OCUVITE-LUTEIN) CAPS capsule Take 1 capsule by mouth daily.     . pantoprazole (PROTONIX) 40 MG tablet Take 1 tablet (40 mg total) by mouth daily. Take 30-60 min before first meal of the day 90 tablet 1  . polyethylene glycol powder (GLYCOLAX/MIRALAX) powder Take 17 g by mouth 2 (two) times daily as needed. (Patient taking differently: Take 17 g by mouth 2 (two) times daily as needed for mild constipation. ) 3350 g 1  . senna-docusate (SENOKOT-S) 8.6-50 MG tablet Take 1 tablet by mouth daily.    [name] tetrahydrozoline 0.05 % ophthalmic solution Place 1 drop into both eyes as needed (dry eyes).     No facility-administered medications prior to visit.     ROS: Review of Systems  Constitutional: Positive for activity change, appetite change, fatigue and unexpected weight change.  HENT: Negative for congestion, nosebleeds, sneezing, sore throat and trouble swallowing.   Eyes: Negative for itching and visual  disturbance.  Respiratory: Negative for cough, shortness of breath and wheezing.   Cardiovascular: Negative for chest pain, palpitations and leg swelling.  Gastrointestinal: Negative for abdominal distention, blood in stool, diarrhea and nausea.  Genitourinary: Negative for frequency and hematuria.  Musculoskeletal: Negative for back pain, gait problem, joint swelling and neck pain.  Skin: Negative for rash.  Neurological: Negative for dizziness, tremors, speech difficulty and weakness.  Psychiatric/Behavioral: Positive for confusion and dysphoric mood. Negative for agitation, sleep disturbance and suicidal ideas. The patient is not nervous/anxious.     Objective:  BP 116/74 (BP Location: Right Arm, Patient Position: Sitting, Cuff Size: Normal)   Pulse 65   Temp 98 F (36.7 C) (Oral)   Ht 6\' 1"  (1.854 m)   SpO2 94%   BMI 21.73 kg/m   BP Readings from Last 3 Encounters:  12/02/17 116/74  11/27/17 128/67  11/15/17 118/68    Wt Readings from Last 3 Encounters:  11/26/17 164 lb 10.9 oz (74.7 kg)  11/15/17 163 lb (73.9 kg)  11/05/17 166 lb (75.3 kg)    Physical Exam  Constitutional: He is oriented to person, place, and time. He appears well-developed. No  distress.  NAD  HENT:  Mouth/Throat: Oropharynx is clear and moist.  Eyes: Pupils are equal, round, and reactive to light. Conjunctivae are normal.  Neck: Normal range of motion. No JVD present. No thyromegaly present.  Cardiovascular: Normal rate, regular rhythm, normal heart sounds and intact distal pulses. Exam reveals no gallop and no friction rub.  No murmur heard. Pulmonary/Chest: Effort normal and breath sounds normal. No respiratory distress. He has no wheezes. He has no rales. He exhibits no tenderness.  Abdominal: Soft. Bowel sounds are normal. He exhibits no distension and no mass. There is no tenderness. There is no rebound and no guarding.  Musculoskeletal: Normal range of motion. He exhibits no edema or  tenderness.  Lymphadenopathy:    He has no cervical adenopathy.  Neurological: He is alert and oriented to person, place, and time. He has normal reflexes. No cranial nerve deficit. He exhibits normal muscle tone. He displays a negative Romberg sign. Coordination and gait normal.  Skin: Skin is warm and dry. No rash noted.  Psychiatric: He has a normal mood and affect. His behavior is normal. Judgment and thought content normal.  in a /c Ex wife is w/him Slurred speech Mild R facial droop Alert, cooperative  Lab Results  Component Value Date   WBC 9.2 11/27/2017   HGB 9.0 (L) 11/27/2017   HCT 27.5 (L) 11/27/2017   PLT 183 11/27/2017   GLUCOSE 86 11/27/2017   CHOL 176 06/04/2017   TRIG 113 06/04/2017   HDL 31 (L) 06/04/2017   LDLCALC 122 (H) 06/04/2017   ALT 26 11/24/2017   AST 66 (H) 11/24/2017   NA 143 11/27/2017   K 3.2 (L) 11/27/2017   CL 111 11/27/2017   CREATININE 1.32 (H) 11/27/2017   BUN 14 11/27/2017   CO2 27 11/27/2017   TSH 5.393 (H) 06/11/2017   PSA 1.13 04/24/2016   INR 1.44 11/27/2017   HGBA1C 5.6 06/04/2017    Ct Head Wo Contrast  Result Date: 11/24/2017 CLINICAL DATA:  Altered mental status, fever, generalized weakness EXAM: CT HEAD WITHOUT CONTRAST TECHNIQUE: Contiguous axial images were obtained from the base of the skull through the vertex without intravenous contrast. COMPARISON:  08/01/2016 FINDINGS: Brain: No evidence of acute infarction, hemorrhage, hydrocephalus, extra-axial collection or mass lesion/mass effect. Encephalomalacic changes in the medial right frontal lobe. Subcortical white matter and periventricular small vessel ischemic changes. Vascular: Intracranial atherosclerosis. Skull: Normal. Negative for fracture or focal lesion. Sinuses/Orbits: The visualized paranasal sinuses are essentially clear. The mastoid air cells are unopacified. Other: None. IMPRESSION: No evidence of acute intracranial abnormality. Encephalomalacic changes in the  medial right frontal lobe. Small vessel ischemic changes. Electronically Signed   By: Julian Hy M.D.   On: 11/24/2017 15:42   Dg Chest Port 1 View  Result Date: 11/24/2017 CLINICAL DATA:  Altered level of consciousness EXAM: PORTABLE CHEST 1 VIEW COMPARISON:  08/14/2017 FINDINGS: Mild patchy bilateral lower lung opacities, suspicious for multifocal pneumonia, possibly on the basis of aspiration. No pleural effusion or pneumothorax. Biapical pleural-parenchymal scarring. The heart is normal in size. Left subclavian pacemaker. IMPRESSION: Mild patchy bilateral lower lung opacities, suspicious for multifocal pneumonia, possibly on the basis of aspiration. Electronically Signed   By: Julian Hy M.D.   On: 11/24/2017 15:39    Assessment & Plan:   There are no diagnoses linked to this encounter.   No orders of the defined types were placed in this encounter.    Follow-up: No follow-ups on file.  Alex  , MD

## 2017-12-03 ENCOUNTER — Other Ambulatory Visit: Payer: Self-pay

## 2017-12-03 DIAGNOSIS — Z0279 Encounter for issue of other medical certificate: Secondary | ICD-10-CM

## 2017-12-03 DIAGNOSIS — I5032 Chronic diastolic (congestive) heart failure: Secondary | ICD-10-CM | POA: Diagnosis not present

## 2017-12-03 DIAGNOSIS — N184 Chronic kidney disease, stage 4 (severe): Secondary | ICD-10-CM | POA: Diagnosis not present

## 2017-12-03 DIAGNOSIS — J449 Chronic obstructive pulmonary disease, unspecified: Secondary | ICD-10-CM | POA: Diagnosis not present

## 2017-12-03 DIAGNOSIS — F329 Major depressive disorder, single episode, unspecified: Secondary | ICD-10-CM | POA: Diagnosis not present

## 2017-12-03 DIAGNOSIS — I130 Hypertensive heart and chronic kidney disease with heart failure and stage 1 through stage 4 chronic kidney disease, or unspecified chronic kidney disease: Secondary | ICD-10-CM | POA: Diagnosis not present

## 2017-12-03 DIAGNOSIS — H548 Legal blindness, as defined in USA: Secondary | ICD-10-CM | POA: Diagnosis not present

## 2017-12-03 NOTE — Patient Outreach (Signed)
[facility]) Care Management   12/03/2017  NORTH ESTERLINE Sep 20, 1931 [id number]  Chad [name] is an 84 y.o. male [name] ex wife present during home visit.  Subjective:  Patient reports weakness and feeling like he has had a stroke. Reports worsening health for the last 2 weeks. Patient reports that he has been unable to walk. Ex wife is full time caregiver and manages all aspects of caring including feeding, bathing, toileting and medication management.  Patient spends most of his day in the wheelchair.  Patient denies difficulty swallowing. Productive cough noted during home visit. Patient see [name] and attends appointments at the [facility].  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  Objective:  Pale nail beds. No teeth. Appear weak and fragile, Leans to the left in the wheelchair, However grips strong and equal.  Today's Vitals   12/03/17 1011 12/03/17 1016  BP: (!) 118/58   Pulse: 88   Resp: 18   SpO2: 92%   Weight: 168 lb (76.2 kg)   Height: 1.854 m (6\' 1" )   PainSc:  0-No pain   Review of Systems  Constitutional: Positive for malaise/fatigue.  HENT: Negative.        No teeth  Eyes: Negative.   Respiratory: Positive for cough and sputum production.   Cardiovascular: Positive for leg swelling.  Gastrointestinal: Negative.   Genitourinary:       Wears depends  Musculoskeletal: Positive for falls.  Skin:       Reports a fungus to both great toes.   Neurological: Positive for speech change and weakness.       Left sided weakness notice yesterday. Saw primary MD yesterday and deemed to have a small stroke.   Endo/Heme/Allergies: Negative.   Psychiatric/Behavioral: Negative.     Physical Exam  Constitutional: He is oriented to person, place, and time. He appears well-developed.  Thin appearing.  HENT:  Speech noted to be moist sounding  Cardiovascular: Normal rate, regular rhythm and intact distal pulses.  No murmur heard. Neurological:  He is alert and oriented to person, place, and time.  Grips equal. Able to do pedal pushes equally.  Leans to the left in wheelchair.   Skin: Skin is warm and dry.  Right and left great toe with patches of white skin to tip of toe.  ?fungus  Psychiatric: He has a normal mood and affect. His behavior is normal. Judgment and thought content normal.    Encounter Medications:   Outpatient Encounter Medications as of 12/03/2017  Medication Sig  . apixaban (ELIQUIS) 2.5 MG TABS tablet Take 1 tablet (2.5 mg total) by mouth 2 (two) times daily. Resume 7/28/19PM  . atorvastatin (LIPITOR) 40 MG tablet Take 1 tablet (40 mg total) by mouth daily at 6 PM. (Patient taking differently: Take 20 mg by mouth daily at 6 PM. )  . cholecalciferol (VITAMIN D) 1000 units tablet Take 1 tablet (1,000 Units total) by mouth daily.  . clidinium-chlordiazePOXIDE (LIBRAX) 5-2.5 MG capsule Take 1 capsule by mouth 2 (two) times daily.  . cyanocobalamin 1000 MCG tablet Take 1,000 mcg by mouth daily.   [name] escitalopram (LEXAPRO) 10 MG tablet 10 mg daily.   . famotidine (PEPCID) 20 MG tablet Take 1 tablet (20 mg total) by mouth at bedtime.  . feeding supplement, ENSURE ENLIVE, (ENSURE ENLIVE) LIQD Take 237 mLs by mouth 2 (two) times daily between meals.  . fluticasone (FLONASE) 50 MCG/ACT nasal spray USE 2 SPRAYS IN EACH NOSTRIL DAILY (  Patient taking differently: Place 2 sprays into both nostrils as needed for allergies. )  . gabapentin (NEURONTIN) 300 MG capsule Take 3 capsules at bedtime. (Patient taking differently: Take 900 mg by mouth at bedtime. )  . ipratropium (ATROVENT) 0.02 % nebulizer solution Take 2.5 mLs (0.5 mg total) by nebulization 3 (three) times daily. Via HHN  . Maltodextrin-Xanthan Gum (RESOURCE THICKENUP CLEAR) POWD Take 1 g by mouth as needed.  . mirtazapine (REMERON) 15 MG tablet 15 mg at bedtime.   . multivitamin-lutein (OCUVITE-LUTEIN) CAPS capsule Take 1 capsule by mouth daily.   . pantoprazole  (PROTONIX) 40 MG tablet Take 1 tablet (40 mg total) by mouth daily. Take 30-60 min before first meal of the day  . polyethylene glycol powder (GLYCOLAX/MIRALAX) powder Take 17 g by mouth 2 (two) times daily as needed. (Patient taking differently: Take 17 g by mouth 2 (two) times daily as needed for mild constipation. )  . senna-docusate (SENOKOT-S) 8.6-50 MG tablet Take 1 tablet by mouth daily.  [name] tetrahydrozoline 0.05 % ophthalmic solution Place 1 drop into both eyes as needed (dry eyes).  . [DISCONTINUED] clidinium-chlordiazePOXIDE (LIBRAX) 5-2.5 MG capsule Take 1 capsule by mouth 2 (two) times daily. (Patient not taking: Reported on 12/03/2017)   No facility-administered encounter medications on file as of 12/03/2017.     Functional Status:   In your present state of health, do you have any difficulty performing the following activities: 12/03/2017 11/25/2017  Hearing? N -  Vision? Y -  Comment wears glasses -  Difficulty concentrating or making decisions? N -  Walking or climbing stairs? Y -  Dressing or bathing? Y -  Doing errands, shopping? Tempie Donning  Preparing Food and eating ? Y -  Using the Toilet? Y -  In the past six months, have you accidently leaked urine? Y -  Do you have problems with loss of bowel control? N -  Managing your Medications? N -  Managing your Finances? N -  Housekeeping or managing your Housekeeping? N -  Some recent data might be hidden    Fall/Depression Screening:    Fall Risk  12/03/2017 11/04/2017 08/07/2017  Falls in the past year? 1 Yes Yes  Comment - - Emmi Telephone Survey: data to providers prior to load  Number falls in past yr: 1 2 or more 2 or more  Comment - - Emmi Telephone Survey Actual Response = 4  Injury with Fall? 0 No No  Comment - - -  Risk Factor Category  - High Fall Risk -  Risk for fall due to : History of fall(s) History of fall(s);Impaired balance/gait;Impaired mobility;Impaired vision -  Follow up - Falls evaluation  completed;Education provided -   [facility] 2/9 Scores 12/03/2017 08/07/2017 06/17/2017 03/14/2016 02/22/2016 02/04/2016 12/06/2015  PHQ - 2 Score 1 1 0 0 0 0 0    Assessment:   (1) reviewed [facility] care management program. Provided new patient packet including 24 hour nurse magnet and my contact card. Provided [facility] calendar.  Reviewed consent and consent signed.  MD office does transition of care.  (2) no advanced directives.  My concerns if patient is very fragile. Reviewed patients wishes and encouraged patient to talk with his family about his wishes.  (3) poor mobility and high risk for falls.  (4) recent admission for aspiration pneumonia.  Noted patient with weak cough and moist sounding voice.   Plan:  (1) consent scanned into chart. Will plan follow up with patient in 1  week. (2) reviewed Advanced directive packet and encouraged completion.  (3) reviewed fall precautions. Reviewed importance of picking up feet and not shuffling. Encouraged patient to do home exercises as suggested by PT.  (4) reviewed aspiration precautions. Reviewed importance of purposefully swallowing. And clearing of throat. Encouraged patient to continue to use thick it.  Encouraged wife to call MD for increased shortness of breath or fever.    This note and barrier letter sent to MD.   [facility] CM Care Plan Problem One     Most Recent Value  Care Plan Problem One  Recent admission for sepsis and pnemonia  Role Documenting the Problem One  Care Management [place] for Problem One  Active  [facility] Long Term Goal   Patient will report no readmissions in the next 45 days.   [facility] Long Term Goal Start Date  12/03/17  Interventions for Problem One Long Term Goal  Home visit completed. reviewed all discharge orders. Confimed arrival of home health.   [facility] CM Short Term Goal #1   Patient and or girlfirend will report increased strength in the next 30 days.   [facility] CM Short Term Goal #1 Start Date  12/03/17  Interventions for  Short Term Goal #1  reviewed importance of safety and reviewed importance of working on home exercises and having good nutrition to build strength.  [facility] CM Short Term Goal #2   Patient will report no difficulty breathing in the next 30 days.   [facility] CM Short Term Goal #2 Start Date  12/03/17  Interventions for Short Term Goal #2  Reviewed reason for concerns in change of symptoms to include fever, shortness of  breath, increased congestion or use of accessory muscle to breath.      Tomasa Rand, RN, BSN, CEN [facility] [employer] [phone]

## 2017-12-03 NOTE — Telephone Encounter (Signed)
Called Sonia Baller no answer LMOM w/MD response.Marland KitchenJohny Chess

## 2017-12-04 ENCOUNTER — Telehealth: Payer: Self-pay | Admitting: Internal Medicine

## 2017-12-04 DIAGNOSIS — F329 Major depressive disorder, single episode, unspecified: Secondary | ICD-10-CM | POA: Diagnosis not present

## 2017-12-04 DIAGNOSIS — H548 Legal blindness, as defined in USA: Secondary | ICD-10-CM | POA: Diagnosis not present

## 2017-12-04 DIAGNOSIS — I5032 Chronic diastolic (congestive) heart failure: Secondary | ICD-10-CM | POA: Diagnosis not present

## 2017-12-04 DIAGNOSIS — N184 Chronic kidney disease, stage 4 (severe): Secondary | ICD-10-CM | POA: Diagnosis not present

## 2017-12-04 DIAGNOSIS — I130 Hypertensive heart and chronic kidney disease with heart failure and stage 1 through stage 4 chronic kidney disease, or unspecified chronic kidney disease: Secondary | ICD-10-CM | POA: Diagnosis not present

## 2017-12-04 DIAGNOSIS — J449 Chronic obstructive pulmonary disease, unspecified: Secondary | ICD-10-CM | POA: Diagnosis not present

## 2017-12-04 NOTE — Telephone Encounter (Signed)
Copied from [place] [id number]. Topic: General - Inquiry >> Dec 04, 2017  2:25 PM Conception Chancy, NT wrote: Reason for CRM: Karena Addison is calling from [facility], she is the nurse and states the person has less of a appetite, sleeping more, and unsteady on his feet. She will see the patient again on 12/06/17.   Cb# [phone]

## 2017-12-04 NOTE — Telephone Encounter (Signed)
Copied from [place] [id number]. Topic: Quick Communication - Home Health Verbal Orders >> Dec 04, 2017 10:51 AM Rayann Heman wrote: Caller/Agency: melanie/[facility] Callback [phone] Requesting PT Frequency: 2x3

## 2017-12-04 NOTE — Telephone Encounter (Signed)
Noted. Thx.

## 2017-12-04 NOTE — Telephone Encounter (Signed)
Routing to dr plotnikov, fyi.... 

## 2017-12-04 NOTE — Telephone Encounter (Signed)
Verbals given  

## 2017-12-06 ENCOUNTER — Ambulatory Visit: Payer: Self-pay | Admitting: *Deleted

## 2017-12-06 DIAGNOSIS — I130 Hypertensive heart and chronic kidney disease with heart failure and stage 1 through stage 4 chronic kidney disease, or unspecified chronic kidney disease: Secondary | ICD-10-CM | POA: Diagnosis not present

## 2017-12-06 DIAGNOSIS — F329 Major depressive disorder, single episode, unspecified: Secondary | ICD-10-CM | POA: Diagnosis not present

## 2017-12-06 DIAGNOSIS — I5032 Chronic diastolic (congestive) heart failure: Secondary | ICD-10-CM | POA: Diagnosis not present

## 2017-12-06 DIAGNOSIS — N184 Chronic kidney disease, stage 4 (severe): Secondary | ICD-10-CM | POA: Diagnosis not present

## 2017-12-06 DIAGNOSIS — H548 Legal blindness, as defined in USA: Secondary | ICD-10-CM | POA: Diagnosis not present

## 2017-12-06 DIAGNOSIS — J449 Chronic obstructive pulmonary disease, unspecified: Secondary | ICD-10-CM | POA: Diagnosis not present

## 2017-12-06 NOTE — Telephone Encounter (Signed)
Dee,LPN with [facility] calling to report that the pt has a temperature of 100.1, O2 sats ranging from 88-90% and the pt is not on oxygen.Karena Addison states that this is her second visit with the pt and when sats were checked with the pt on Monday the ranged in the low 90s.  Karena Addison states that the pt has mood changes, decreased appetite and seems shaky with tremors. Karena Addison states that the pt does not have SOB or other symptoms at this time. Karena Addison reports that the pt has a history of CHF and COPD. Dee advised that if the pt had worsening symptoms the pt should seek care in the ED. Understanding verbalized.  Answer Assessment - Initial Assessment Questions 1. REASON FOR CALL or QUESTION: "What is your reason for calling today?" or "How can I best help you?" or "What question do you have that I can help answer?"     Dee, LPN calling to give report on the pt.  Protocols used: INFORMATION ONLY CALL-A-AH

## 2017-12-09 NOTE — Telephone Encounter (Signed)
FYI

## 2017-12-09 NOTE — Telephone Encounter (Signed)
Noted. Thx.

## 2017-12-10 ENCOUNTER — Other Ambulatory Visit: Payer: Self-pay

## 2017-12-10 DIAGNOSIS — I5032 Chronic diastolic (congestive) heart failure: Secondary | ICD-10-CM | POA: Diagnosis not present

## 2017-12-10 DIAGNOSIS — J449 Chronic obstructive pulmonary disease, unspecified: Secondary | ICD-10-CM | POA: Diagnosis not present

## 2017-12-10 DIAGNOSIS — I130 Hypertensive heart and chronic kidney disease with heart failure and stage 1 through stage 4 chronic kidney disease, or unspecified chronic kidney disease: Secondary | ICD-10-CM | POA: Diagnosis not present

## 2017-12-10 DIAGNOSIS — F329 Major depressive disorder, single episode, unspecified: Secondary | ICD-10-CM | POA: Diagnosis not present

## 2017-12-10 DIAGNOSIS — H548 Legal blindness, as defined in USA: Secondary | ICD-10-CM | POA: Diagnosis not present

## 2017-12-10 DIAGNOSIS — N184 Chronic kidney disease, stage 4 (severe): Secondary | ICD-10-CM | POA: Diagnosis not present

## 2017-12-10 NOTE — Patient Outreach (Signed)
Telephone follow up:  Placed call to patient for follow up on progress. No answer. Left a message requesting a call back.  Plan:Will await a call back. If no call back will recall patient.  Tomasa Rand, RN, BSN, CEN [facility] [employer] [phone]

## 2017-12-11 ENCOUNTER — Other Ambulatory Visit: Payer: Self-pay

## 2017-12-11 DIAGNOSIS — N184 Chronic kidney disease, stage 4 (severe): Secondary | ICD-10-CM | POA: Diagnosis not present

## 2017-12-11 DIAGNOSIS — H548 Legal blindness, as defined in USA: Secondary | ICD-10-CM | POA: Diagnosis not present

## 2017-12-11 DIAGNOSIS — I130 Hypertensive heart and chronic kidney disease with heart failure and stage 1 through stage 4 chronic kidney disease, or unspecified chronic kidney disease: Secondary | ICD-10-CM | POA: Diagnosis not present

## 2017-12-11 DIAGNOSIS — J449 Chronic obstructive pulmonary disease, unspecified: Secondary | ICD-10-CM | POA: Diagnosis not present

## 2017-12-11 DIAGNOSIS — F329 Major depressive disorder, single episode, unspecified: Secondary | ICD-10-CM | POA: Diagnosis not present

## 2017-12-11 DIAGNOSIS — I5032 Chronic diastolic (congestive) heart failure: Secondary | ICD-10-CM | POA: Diagnosis not present

## 2017-12-11 NOTE — Patient Outreach (Signed)
Telephone follow up: Placed call to patient who answered and reports he is feeling some better. Then patient ask me to speak with wife.  Spoke with Mrs. Dowen who reports patient is stronger than he was last week. Reports speech therapy saw patient yesterday and assisted with instructions on thick it.  Wife states patients breathing is better and that he is using his feet to move the wheelchair around to get some exercise.  Wife reports patient had a good night last night.   PLAN: will plan follow up in 2 weeks. Encouraged wife to call sooner if needed.  Tomasa Rand, RN, BSN, CEN [facility] [employer] [phone]

## 2017-12-12 DIAGNOSIS — I130 Hypertensive heart and chronic kidney disease with heart failure and stage 1 through stage 4 chronic kidney disease, or unspecified chronic kidney disease: Secondary | ICD-10-CM | POA: Diagnosis not present

## 2017-12-12 DIAGNOSIS — N184 Chronic kidney disease, stage 4 (severe): Secondary | ICD-10-CM | POA: Diagnosis not present

## 2017-12-12 DIAGNOSIS — F329 Major depressive disorder, single episode, unspecified: Secondary | ICD-10-CM | POA: Diagnosis not present

## 2017-12-12 DIAGNOSIS — I5032 Chronic diastolic (congestive) heart failure: Secondary | ICD-10-CM | POA: Diagnosis not present

## 2017-12-12 DIAGNOSIS — J449 Chronic obstructive pulmonary disease, unspecified: Secondary | ICD-10-CM | POA: Diagnosis not present

## 2017-12-12 DIAGNOSIS — H548 Legal blindness, as defined in USA: Secondary | ICD-10-CM | POA: Diagnosis not present

## 2017-12-13 ENCOUNTER — Telehealth: Payer: Self-pay | Admitting: Internal Medicine

## 2017-12-13 DIAGNOSIS — N184 Chronic kidney disease, stage 4 (severe): Secondary | ICD-10-CM | POA: Diagnosis not present

## 2017-12-13 DIAGNOSIS — H548 Legal blindness, as defined in USA: Secondary | ICD-10-CM | POA: Diagnosis not present

## 2017-12-13 DIAGNOSIS — I5032 Chronic diastolic (congestive) heart failure: Secondary | ICD-10-CM | POA: Diagnosis not present

## 2017-12-13 DIAGNOSIS — I130 Hypertensive heart and chronic kidney disease with heart failure and stage 1 through stage 4 chronic kidney disease, or unspecified chronic kidney disease: Secondary | ICD-10-CM | POA: Diagnosis not present

## 2017-12-13 DIAGNOSIS — F329 Major depressive disorder, single episode, unspecified: Secondary | ICD-10-CM | POA: Diagnosis not present

## 2017-12-13 DIAGNOSIS — J449 Chronic obstructive pulmonary disease, unspecified: Secondary | ICD-10-CM | POA: Diagnosis not present

## 2017-12-13 NOTE — Telephone Encounter (Signed)
Copied from [place] [id number]. Topic: Quick Communication - See Telephone Encounter >> Dec 13, 2017  2:37 PM Vernona Rieger wrote: CRM for notification. See Telephone encounter for: 12/13/17.  Chad Haring, RN with [facility] called and said that physical therapy was out at his home today. Physical therapy said there were places found on his left heel that are very purple, but not an open wound. Also, on his left ankle bone (inside and outside) there are some little purple areas that aren't open. She wanted to know if Dr Alain Marion wanted nursing to do anything specifically to the wound or just educate him on it. Chad [name] can be reached @ [phone]

## 2017-12-14 NOTE — Telephone Encounter (Signed)
Please advise 

## 2017-12-15 NOTE — Telephone Encounter (Signed)
Pls educate Thx

## 2017-12-16 ENCOUNTER — Emergency Department (HOSPITAL_COMMUNITY): Payer: Medicare Other

## 2017-12-16 ENCOUNTER — Other Ambulatory Visit: Payer: Self-pay

## 2017-12-16 ENCOUNTER — Ambulatory Visit: Payer: Self-pay | Admitting: *Deleted

## 2017-12-16 ENCOUNTER — Inpatient Hospital Stay (HOSPITAL_COMMUNITY)
Admission: EM | Admit: 2017-12-16 | Discharge: 2017-12-19 | DRG: 177 | Disposition: A | Discharge status: Hospice | Payer: Medicare Other | Attending: Internal Medicine | Admitting: Internal Medicine

## 2017-12-16 DIAGNOSIS — K219 Gastro-esophageal reflux disease without esophagitis: Secondary | ICD-10-CM | POA: Diagnosis present

## 2017-12-16 DIAGNOSIS — J449 Chronic obstructive pulmonary disease, unspecified: Secondary | ICD-10-CM | POA: Diagnosis not present

## 2017-12-16 DIAGNOSIS — Z86010 Personal history of colonic polyps: Secondary | ICD-10-CM

## 2017-12-16 DIAGNOSIS — Z9842 Cataract extraction status, left eye: Secondary | ICD-10-CM | POA: Diagnosis not present

## 2017-12-16 DIAGNOSIS — N184 Chronic kidney disease, stage 4 (severe): Secondary | ICD-10-CM | POA: Diagnosis present

## 2017-12-16 DIAGNOSIS — R54 Age-related physical debility: Secondary | ICD-10-CM | POA: Diagnosis present

## 2017-12-16 DIAGNOSIS — M19042 Primary osteoarthritis, left hand: Secondary | ICD-10-CM | POA: Diagnosis present

## 2017-12-16 DIAGNOSIS — Z7189 Other specified counseling: Secondary | ICD-10-CM | POA: Diagnosis not present

## 2017-12-16 DIAGNOSIS — J690 Pneumonitis due to inhalation of food and vomit: Secondary | ICD-10-CM | POA: Diagnosis not present

## 2017-12-16 DIAGNOSIS — I4891 Unspecified atrial fibrillation: Secondary | ICD-10-CM | POA: Diagnosis not present

## 2017-12-16 DIAGNOSIS — Z961 Presence of intraocular lens: Secondary | ICD-10-CM | POA: Diagnosis present

## 2017-12-16 DIAGNOSIS — Z87891 Personal history of nicotine dependence: Secondary | ICD-10-CM

## 2017-12-16 DIAGNOSIS — N179 Acute kidney failure, unspecified: Secondary | ICD-10-CM | POA: Diagnosis present

## 2017-12-16 DIAGNOSIS — E785 Hyperlipidemia, unspecified: Secondary | ICD-10-CM | POA: Diagnosis present

## 2017-12-16 DIAGNOSIS — Z66 Do not resuscitate: Secondary | ICD-10-CM | POA: Diagnosis present

## 2017-12-16 DIAGNOSIS — I639 Cerebral infarction, unspecified: Secondary | ICD-10-CM | POA: Diagnosis present

## 2017-12-16 DIAGNOSIS — I4892 Unspecified atrial flutter: Secondary | ICD-10-CM | POA: Diagnosis present

## 2017-12-16 DIAGNOSIS — K589 Irritable bowel syndrome without diarrhea: Secondary | ICD-10-CM | POA: Diagnosis present

## 2017-12-16 DIAGNOSIS — E43 Unspecified severe protein-calorie malnutrition: Secondary | ICD-10-CM | POA: Diagnosis present

## 2017-12-16 DIAGNOSIS — Z888 Allergy status to other drugs, medicaments and biological substances status: Secondary | ICD-10-CM

## 2017-12-16 DIAGNOSIS — R05 Cough: Secondary | ICD-10-CM | POA: Diagnosis not present

## 2017-12-16 DIAGNOSIS — Z7401 Bed confinement status: Secondary | ICD-10-CM

## 2017-12-16 DIAGNOSIS — I25119 Atherosclerotic heart disease of native coronary artery with unspecified angina pectoris: Secondary | ICD-10-CM | POA: Diagnosis not present

## 2017-12-16 DIAGNOSIS — J189 Pneumonia, unspecified organism: Secondary | ICD-10-CM | POA: Diagnosis not present

## 2017-12-16 DIAGNOSIS — E7849 Other hyperlipidemia: Secondary | ICD-10-CM | POA: Diagnosis not present

## 2017-12-16 DIAGNOSIS — I480 Paroxysmal atrial fibrillation: Secondary | ICD-10-CM | POA: Diagnosis present

## 2017-12-16 DIAGNOSIS — L8995 Pressure ulcer of unspecified site, unstageable: Secondary | ICD-10-CM | POA: Diagnosis not present

## 2017-12-16 DIAGNOSIS — Z8673 Personal history of transient ischemic attack (TIA), and cerebral infarction without residual deficits: Secondary | ICD-10-CM | POA: Diagnosis not present

## 2017-12-16 DIAGNOSIS — D638 Anemia in other chronic diseases classified elsewhere: Secondary | ICD-10-CM | POA: Diagnosis not present

## 2017-12-16 DIAGNOSIS — I444 Left anterior fascicular block: Secondary | ICD-10-CM | POA: Diagnosis present

## 2017-12-16 DIAGNOSIS — R1310 Dysphagia, unspecified: Secondary | ICD-10-CM | POA: Diagnosis not present

## 2017-12-16 DIAGNOSIS — F329 Major depressive disorder, single episode, unspecified: Secondary | ICD-10-CM | POA: Diagnosis not present

## 2017-12-16 DIAGNOSIS — R441 Visual hallucinations: Secondary | ICD-10-CM | POA: Diagnosis present

## 2017-12-16 DIAGNOSIS — R404 Transient alteration of awareness: Secondary | ICD-10-CM | POA: Diagnosis not present

## 2017-12-16 DIAGNOSIS — L89152 Pressure ulcer of sacral region, stage 2: Secondary | ICD-10-CM | POA: Diagnosis present

## 2017-12-16 DIAGNOSIS — Z85810 Personal history of malignant neoplasm of tongue: Secondary | ICD-10-CM

## 2017-12-16 DIAGNOSIS — I2510 Atherosclerotic heart disease of native coronary artery without angina pectoris: Secondary | ICD-10-CM | POA: Diagnosis present

## 2017-12-16 DIAGNOSIS — R627 Adult failure to thrive: Secondary | ICD-10-CM | POA: Diagnosis present

## 2017-12-16 DIAGNOSIS — F339 Major depressive disorder, recurrent, unspecified: Secondary | ICD-10-CM | POA: Diagnosis not present

## 2017-12-16 DIAGNOSIS — I672 Cerebral atherosclerosis: Secondary | ICD-10-CM | POA: Diagnosis not present

## 2017-12-16 DIAGNOSIS — I130 Hypertensive heart and chronic kidney disease with heart failure and stage 1 through stage 4 chronic kidney disease, or unspecified chronic kidney disease: Secondary | ICD-10-CM | POA: Diagnosis not present

## 2017-12-16 DIAGNOSIS — I5032 Chronic diastolic (congestive) heart failure: Secondary | ICD-10-CM | POA: Diagnosis not present

## 2017-12-16 DIAGNOSIS — Z6820 Body mass index (BMI) 20.0-20.9, adult: Secondary | ICD-10-CM | POA: Diagnosis not present

## 2017-12-16 DIAGNOSIS — Z825 Family history of asthma and other chronic lower respiratory diseases: Secondary | ICD-10-CM

## 2017-12-16 DIAGNOSIS — J9601 Acute respiratory failure with hypoxia: Secondary | ICD-10-CM | POA: Diagnosis not present

## 2017-12-16 DIAGNOSIS — Z515 Encounter for palliative care: Secondary | ICD-10-CM | POA: Diagnosis not present

## 2017-12-16 DIAGNOSIS — Z8701 Personal history of pneumonia (recurrent): Secondary | ICD-10-CM

## 2017-12-16 DIAGNOSIS — R410 Disorientation, unspecified: Secondary | ICD-10-CM | POA: Diagnosis not present

## 2017-12-16 DIAGNOSIS — Z8249 Family history of ischemic heart disease and other diseases of the circulatory system: Secondary | ICD-10-CM

## 2017-12-16 DIAGNOSIS — R0902 Hypoxemia: Secondary | ICD-10-CM | POA: Diagnosis not present

## 2017-12-16 DIAGNOSIS — E860 Dehydration: Secondary | ICD-10-CM | POA: Diagnosis present

## 2017-12-16 DIAGNOSIS — Z808 Family history of malignant neoplasm of other organs or systems: Secondary | ICD-10-CM

## 2017-12-16 DIAGNOSIS — I679 Cerebrovascular disease, unspecified: Secondary | ICD-10-CM | POA: Diagnosis not present

## 2017-12-16 DIAGNOSIS — I129 Hypertensive chronic kidney disease with stage 1 through stage 4 chronic kidney disease, or unspecified chronic kidney disease: Secondary | ICD-10-CM | POA: Diagnosis present

## 2017-12-16 DIAGNOSIS — Z79899 Other long term (current) drug therapy: Secondary | ICD-10-CM

## 2017-12-16 DIAGNOSIS — N183 Chronic kidney disease, stage 3 (moderate): Secondary | ICD-10-CM | POA: Diagnosis not present

## 2017-12-16 DIAGNOSIS — H3530 Unspecified macular degeneration: Secondary | ICD-10-CM | POA: Diagnosis present

## 2017-12-16 DIAGNOSIS — J168 Pneumonia due to other specified infectious organisms: Secondary | ICD-10-CM | POA: Diagnosis not present

## 2017-12-16 DIAGNOSIS — M19041 Primary osteoarthritis, right hand: Secondary | ICD-10-CM | POA: Diagnosis present

## 2017-12-16 DIAGNOSIS — M2550 Pain in unspecified joint: Secondary | ICD-10-CM | POA: Diagnosis not present

## 2017-12-16 DIAGNOSIS — L89892 Pressure ulcer of other site, stage 2: Secondary | ICD-10-CM | POA: Diagnosis present

## 2017-12-16 DIAGNOSIS — H401290 Low-tension glaucoma, unspecified eye, stage unspecified: Secondary | ICD-10-CM | POA: Diagnosis not present

## 2017-12-16 DIAGNOSIS — R0602 Shortness of breath: Secondary | ICD-10-CM | POA: Diagnosis not present

## 2017-12-16 DIAGNOSIS — Z7901 Long term (current) use of anticoagulants: Secondary | ICD-10-CM

## 2017-12-16 DIAGNOSIS — J301 Allergic rhinitis due to pollen: Secondary | ICD-10-CM | POA: Diagnosis not present

## 2017-12-16 DIAGNOSIS — H548 Legal blindness, as defined in USA: Secondary | ICD-10-CM | POA: Diagnosis not present

## 2017-12-16 LAB — CBC WITH DIFFERENTIAL/PLATELET
Abs Immature Granulocytes: 0.07 10*3/uL (ref 0.00–0.07)
Basophils Absolute: 0 10*3/uL (ref 0.0–0.1)
Basophils Relative: 0 %
Eosinophils Absolute: 0 10*3/uL (ref 0.0–0.5)
Eosinophils Relative: 0 %
HCT: 31.7 % — ABNORMAL LOW (ref 39.0–52.0)
Hemoglobin: 9.4 g/dL — ABNORMAL LOW (ref 13.0–17.0)
IMMATURE GRANULOCYTES: 1 %
Lymphocytes Relative: 8 %
Lymphs Abs: 1.1 10*3/uL (ref 0.7–4.0)
MCH: 26.9 pg (ref 26.0–34.0)
MCHC: 29.7 g/dL — ABNORMAL LOW (ref 30.0–36.0)
MCV: 90.8 fL (ref 80.0–100.0)
Monocytes Absolute: 1.1 10*3/uL — ABNORMAL HIGH (ref 0.1–1.0)
Monocytes Relative: 8 %
Neutro Abs: 11.6 10*3/uL — ABNORMAL HIGH (ref 1.7–7.7)
Neutrophils Relative %: 83 %
Platelets: 324 10*3/uL (ref 150–400)
RBC: 3.49 MIL/uL — ABNORMAL LOW (ref 4.22–5.81)
RDW: 14.8 % (ref 11.5–15.5)
WBC: 13.9 10*3/uL — ABNORMAL HIGH (ref 4.0–10.5)
nRBC: 0 % (ref 0.0–0.2)

## 2017-12-16 LAB — COMPREHENSIVE METABOLIC PANEL
ALT: 25 U/L (ref 0–44)
AST: 37 U/L (ref 15–41)
Albumin: 2 g/dL — ABNORMAL LOW (ref 3.5–5.0)
Alkaline Phosphatase: 79 U/L (ref 38–126)
Anion gap: 11 (ref 5–15)
BUN: 22 mg/dL (ref 8–23)
CO2: 29 mmol/L (ref 22–32)
Calcium: 8.1 mg/dL — ABNORMAL LOW (ref 8.9–10.3)
Chloride: 103 mmol/L (ref 98–111)
Creatinine, Ser: 1.71 mg/dL — ABNORMAL HIGH (ref 0.61–1.24)
GFR calc Af Amer: 42 mL/min — ABNORMAL LOW (ref >60)
GFR calc non Af Amer: 36 mL/min — ABNORMAL LOW (ref >60)
Glucose, Bld: 97 mg/dL (ref 70–99)
Potassium: 3.9 mmol/L (ref 3.5–5.1)
SODIUM: 143 mmol/L (ref 135–145)
Total Bilirubin: 0.7 mg/dL (ref 0.3–1.2)
Total Protein: 6.2 g/dL — ABNORMAL LOW (ref 6.5–8.1)

## 2017-12-16 LAB — I-STAT CG4 LACTIC ACID, ED
Lactic Acid, Venous: 1.52 mmol/L (ref 0.5–1.9)
Lactic Acid, Venous: 0.71 mmol/L (ref 0.5–1.9)

## 2017-12-16 LAB — TSH: TSH: 3.619 u[IU]/mL (ref 0.350–4.500)

## 2017-12-16 MED ORDER — ATORVASTATIN CALCIUM 10 MG PO TABS
20.0000 mg | ORAL_TABLET | Freq: Every day | ORAL | Status: DC
  Administered 2017-12-17 – 2017-12-18 (×2): 20 mg via ORAL
  Filled 2017-12-16 (×2): qty 2

## 2017-12-16 MED ORDER — SODIUM CHLORIDE 0.9 % IV BOLUS
1000.0000 mL | Freq: Once | INTRAVENOUS | Status: AC
  Administered 2017-12-16: 1000 mL via INTRAVENOUS

## 2017-12-16 MED ORDER — SODIUM CHLORIDE 0.9% FLUSH
3.0000 mL | Freq: Two times a day (BID) | INTRAVENOUS | Status: DC
  Administered 2017-12-18 – 2017-12-19 (×3): 3 mL via INTRAVENOUS

## 2017-12-16 MED ORDER — FLUTICASONE PROPIONATE 50 MCG/ACT NA SUSP
2.0000 | Freq: Every day | NASAL | Status: DC | PRN
  Filled 2017-12-16: qty 16

## 2017-12-16 MED ORDER — SODIUM CHLORIDE 0.9 % IV SOLN: 2.0000 g | INTRAVENOUS | Status: DC

## 2017-12-16 MED ORDER — PRO-STAT SUGAR FREE PO LIQD
30.0000 mL | Freq: Three times a day (TID) | ORAL | Status: DC
  Administered 2017-12-17: 30 mL via ORAL
  Filled 2017-12-16 (×2): qty 30

## 2017-12-16 MED ORDER — VANCOMYCIN HCL 10 G IV SOLR
1500.0000 mg | Freq: Once | INTRAVENOUS | Status: DC
  Filled 2017-12-16: qty 1500

## 2017-12-16 MED ORDER — LACTATED RINGERS IV SOLN
INTRAVENOUS | Status: AC
  Administered 2017-12-16: 20:00:00 via INTRAVENOUS
  Administered 2017-12-17: 1000 mL via INTRAVENOUS

## 2017-12-16 MED ORDER — NAPHAZOLINE-GLYCERIN 0.012-0.2 % OP SOLN
1.0000 [drp] | Freq: Four times a day (QID) | OPHTHALMIC | Status: DC | PRN
  Filled 2017-12-16: qty 15

## 2017-12-16 MED ORDER — METOPROLOL TARTRATE 5 MG/5ML IV SOLN
5.0000 mg | INTRAVENOUS | Status: DC | PRN
  Administered 2017-12-17: 5 mg via INTRAVENOUS
  Filled 2017-12-16: qty 5

## 2017-12-16 MED ORDER — BISACODYL 10 MG RE SUPP: 10.0000 mg | Freq: Every day | RECTAL | Status: DC | PRN

## 2017-12-16 MED ORDER — ACETAMINOPHEN 325 MG PO TABS: 650.0000 mg | ORAL_TABLET | Freq: Four times a day (QID) | ORAL | Status: DC | PRN

## 2017-12-16 MED ORDER — APIXABAN 2.5 MG PO TABS
2.5000 mg | ORAL_TABLET | Freq: Two times a day (BID) | ORAL | Status: DC
  Administered 2017-12-17 – 2017-12-19 (×5): 2.5 mg via ORAL
  Filled 2017-12-16 (×7): qty 1

## 2017-12-16 MED ORDER — ESCITALOPRAM OXALATE 10 MG PO TABS
10.0000 mg | ORAL_TABLET | ORAL | Status: DC
  Administered 2017-12-17 – 2017-12-19 (×3): 10 mg via ORAL
  Filled 2017-12-16 (×3): qty 1

## 2017-12-16 MED ORDER — SODIUM CHLORIDE 0.9 % IV SOLN
500.0000 mg | INTRAVENOUS | Status: DC
  Administered 2017-12-16: 500 mg via INTRAVENOUS
  Filled 2017-12-16: qty 500

## 2017-12-16 MED ORDER — SODIUM CHLORIDE 0.9 % IV SOLN
2.0000 g | Freq: Once | INTRAVENOUS | Status: DC
  Administered 2017-12-16: 2 g via INTRAVENOUS
  Filled 2017-12-16: qty 2

## 2017-12-16 MED ORDER — PANTOPRAZOLE SODIUM 40 MG PO TBEC
40.0000 mg | DELAYED_RELEASE_TABLET | Freq: Every day | ORAL | Status: DC
  Administered 2017-12-17 – 2017-12-19 (×3): 40 mg via ORAL
  Filled 2017-12-16 (×3): qty 1

## 2017-12-16 MED ORDER — FAMOTIDINE 20 MG PO TABS
20.0000 mg | ORAL_TABLET | Freq: Every day | ORAL | Status: DC
  Administered 2017-12-17 – 2017-12-18 (×2): 20 mg via ORAL
  Filled 2017-12-16 (×2): qty 1

## 2017-12-16 MED ORDER — POLYETHYLENE GLYCOL 3350 17 GM/SCOOP PO POWD
17.0000 g | Freq: Two times a day (BID) | ORAL | Status: DC | PRN
  Filled 2017-12-16: qty 255

## 2017-12-16 MED ORDER — VANCOMYCIN HCL IN DEXTROSE 1-5 GM/200ML-% IV SOLN: 1000.0000 mg | Freq: Once | INTRAVENOUS | Status: DC

## 2017-12-16 MED ORDER — SODIUM CHLORIDE 0.9 % IV SOLN: 500.0000 mg | INTRAVENOUS | Status: DC

## 2017-12-16 MED ORDER — SODIUM CHLORIDE 0.9 % IV SOLN
3.0000 g | Freq: Two times a day (BID) | INTRAVENOUS | Status: DC
  Administered 2017-12-17: 3 g via INTRAVENOUS
  Filled 2017-12-16 (×2): qty 3

## 2017-12-16 MED ORDER — ACETAMINOPHEN 650 MG RE SUPP: 650.0000 mg | Freq: Four times a day (QID) | RECTAL | Status: DC | PRN

## 2017-12-16 NOTE — ED Provider Notes (Signed)
[place] EMERGENCY DEPARTMENT Provider Note   CSN: [id number] Arrival date & time: 12/16/17  1245     History   Chief Complaint Chief Complaint  Patient presents with  . possible pneumonia    HPI Chad [name] is a 84 y.o. male.  HPI   Level 5 caveat due to confusion.  Chad [name] is a 84 y.o. male, with a history of a flutter, COPD, GERD, HTN, multiple instances of pneumonia, and stroke, presenting to the ED with generalized weakness over the past 3 days. Patient was assessed by home health nurse today and she told the wife that she thought patient had pneumonia. Admitted for pneumonia and dehydration, November 17-20.  Upon discharge, patient seemed to be acting normally.  He was a little weaker than normal, but overall improved from before the admission. He has not been eating or drinking well.  Patient's wife is at the bedside and states she thinks this may be due to the change in the patient's required diet.  Previous pneumonia was thought to be due to aspiration and patient was placed on a thickened liquid diet.  Decreased ambulation, increased shortness of breath, increased nonproductive cough, and some loose stool over the last 3 days.  He also notes some confusion and visual hallucinations over the last several days.    Wife denies hematochezia/melena, known fever, falls/trauma, complaints of abdominal pain.  Patient denies pain or complaints whatsoever.    Past Medical History:  Diagnosis Date  . Arthritis    "hands" (01/12/2013); feet (podiatry consultation); "back" (06/12/2017)  . Atrial flutter ([place])    a. Slow ~100bpm when in 2:1; dx 04/2012. b. Placed on Xarelto.  c. s/p ablation 07-08-2012 by Dr Rayann Heman  . Carotid artery calcification    By CXR - dopplers 05/06/12 without obvious evidence of significant ICA stenosis >40%  . Colon polyps 06/26/2005  . COPD (chronic obstructive pulmonary disease) ([employer])   . Diverticulosis of colon (without  mention of hemorrhage)   . Gastritis, chronic    Pt denies bleeding 04/2012. EGD 2012 reportedly normal.  . GERD (gastroesophageal reflux disease)   . Hypertension   . Hyponatremia    04/2012 r/t diuretic.  . IBS (irritable bowel syndrome)   . Internal and external hemorrhoids without complication   . Macular degeneration of left eye   . Pneumonia    "@ least 5 times" (06/12/2017)  . Stroke ([place])   . Tongue cancer ([place]) 02/1997    Patient Active Problem List   Diagnosis Date Noted  . CAD (coronary artery disease) 12/02/2017  . FTT (failure to thrive) in adult 12/02/2017  . Dysphagia 12/02/2017  . Umbilical hernia 09/62/8366  . DNR (do not resuscitate) 12/02/2017  . Acute respiratory failure with hypoxemia ([place]) 11/24/2017  . Legal blindness 10/15/2017  . Blood per rectum 09/13/2017  . Weight loss 09/13/2017  . Syncope 08/01/2017  . Wart viral 06/17/2017  . Bradycardia   . Calcium channel blocker overdose   . Back pain 06/11/2017  . Symptomatic bradycardia 06/11/2017  . Hypertension 06/11/2017  . 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 History of CVA (cerebrovascular accident)-May 2019 06/11/2017  . Acute hyponatremia 06/11/2017  . Chronic kidney disease (CKD), stage IV (severe) ([place]) 06/11/2017  . Chronic diastolic heart failure ([place]) 06/11/2017  . HLD (hyperlipidemia) 06/11/2017  . Paroxysmal atrial flutter ([place]) 06/11/2017  . Cerebral embolism with cerebral infarction 06/04/2017  . CVA (cerebral vascular accident) ([place]) 06/04/2017  .  Stroke-like symptoms 06/03/2017  . Bronchiectasis without complication ([place]) 11/04/2534  . DOE (dyspnea on exertion) 04/24/2017  . Cough in adult 02/13/2017  . Flatulence 02/13/2017  . Hemoptysis 06/12/2016  . Creatinine elevation 06/12/2016  . Eyebrow laceration, right, sequela 05/18/2016  . Abnormal TSH 05/18/2016  . Well adult exam 04/20/2016  . Enlarged prostate 04/20/2016  . Community acquired  pneumonia   . Pneumonia of both lungs due to infectious organism 03/02/2016  . 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 Anal lesion 06/07/2015  . Gas 01/18/2015  . Aortic stenosis 06/08/2013  . Unsteadiness 02/11/2013  . Ectopic atrial tachycardia ([place]) 01/12/2013  . Atrial tachycardia ([place]) 01/12/2013  . Atrial flutter ([place]) 05/06/2012  . Aortic valve sclerosis 05/06/2012  . Acute renal failure ([place]) 05/05/2012  . 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 Anemia 07/27/2010  . Essential hypertension 11/15/2008  . COPD (chronic obstructive pulmonary disease) with chronic bronchitis ([place]) 11/15/2008  . GERD 11/15/2008  . IBS 11/15/2008  . Midsternal chest pain 11/15/2008    Past Surgical History:  Procedure Laterality Date  . ATRIAL FLUTTER ABLATION N/A 07/08/2012   Procedure: ATRIAL FLUTTER ABLATION;  Surgeon: Thompson Grayer, MD;  Location[facility] CATH LAB;  Service: Cardiovascular;  Laterality: N/A;  . CARDIAC CATHETERIZATION  04/2012  . CATARACT EXTRACTION W/ INTRAOCULAR LENS IMPLANT Left   . COLONOSCOPY  08/29/10   diverticulosis, internal hemorrhoids  . ESOPHAGOGASTRODUODENOSCOPY  09/20/10   normal  . [place]  . LEFT HEART CATHETERIZATION WITH CORONARY ANGIOGRAM N/A 05/06/2012   Procedure: LEFT HEART CATHETERIZATION WITH CORONARY ANGIOGRAM;  Surgeon: Peter M Martinique, MD;  Location: [facility] CATH LAB;  Service: Cardiovascular;  Laterality: N/A;  . 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 PACEMAKER IMPLANT N/A 08/01/2017   Procedure: PACEMAKER IMPLANT;  Surgeon: Constance Haw, MD;  Location: [facility] CV LAB;  Service: Cardiovascular;  Laterality: N/A;  . TRIGGER FINGER RELEASE Right 1970's   "2 fingers"  . WART FULGURATION Left 09/15/2015   Procedure: EXCISIONal biospy of left peri anual and anual canal mass;  Surgeon: Greer Pickerel, MD;  Location: [facility] ORS;  Service: General;  Laterality:  Left;        Home Medications    Prior to Admission medications   Medication Sig Start Date End Date Taking? Authorizing Provider  apixaban (ELIQUIS) 2.5 MG TABS tablet Take 1 tablet (2.5 mg total) by mouth 2 (two) times daily. Resume 7/28/19PM Patient taking differently: Take 2.5 mg by mouth 2 (two) times daily.  08/07/17  Yes Lelon Perla, MD  atorvastatin (LIPITOR) 40 MG tablet Take 1 tablet (40 mg total) by mouth daily at 6 PM. Patient taking differently: Take 20 mg by mouth daily at 6 PM.  08/21/17  Yes Plotnikov, Evie Lacks, MD  cholecalciferol (VITAMIN D) 1000 units tablet Take 1 tablet (1,000 Units total) by mouth daily. 10/15/16  Yes Plotnikov, Evie Lacks, MD  clidinium-chlordiazePOXIDE (LIBRAX) 5-2.5 MG capsule Take 1 capsule by mouth 2 (two) times daily.   Yes [provider]  cyanocobalamin 1000 MCG tablet Take 1,000 mcg by mouth daily.    Yes [provider]  escitalopram (LEXAPRO) 10 MG tablet Take 10 mg by mouth every morning.  11/29/17  Yes [provider]  famotidine (PEPCID) 20 MG tablet Take 1 tablet (20 mg total) by mouth at bedtime. 08/14/17  Yes Tanda Rockers, MD  feeding supplement,  ENSURE ENLIVE, (ENSURE ENLIVE) LIQD Take 237 mLs by mouth 2 (two) times daily between meals. Patient taking differently: Take 237 mLs by mouth every other day.  11/27/17  Yes Lavina Hamman, MD  fluticasone (FLONASE) 50 MCG/ACT nasal spray USE 2 SPRAYS IN EACH NOSTRIL DAILY Patient taking differently: Place 2 sprays into both nostrils daily as needed for allergies or rhinitis.  08/15/17  Yes Plotnikov, Evie Lacks, MD  gabapentin (NEURONTIN) 300 MG capsule Take 3 capsules at bedtime. Patient taking differently: Take 900 mg by mouth at bedtime.  11/15/17  Yes Plotnikov, Evie Lacks, MD  ipratropium (ATROVENT) 0.02 % nebulizer solution Take 2.5 mLs (0.5 mg total) by nebulization 3 (three) times daily. [facility] 10/16/17  Yes Plotnikov, Evie Lacks, MD  Maltodextrin-Xanthan  Gum (RESOURCE THICKENUP CLEAR) POWD Take 1 g by mouth as needed. Patient taking differently: Take by mouth See admin instructions. Mix/use in all liquids- sodas, water, etc.. 11/27/17  Yes Lavina Hamman, MD  mirtazapine (REMERON) 15 MG tablet Take 15 mg by mouth at bedtime.  11/29/17  Yes [provider]  multivitamin-lutein (OCUVITE-LUTEIN) CAPS capsule Take 1 capsule by mouth daily.    Yes [provider]  pantoprazole (PROTONIX) 40 MG tablet Take 1 tablet (40 mg total) by mouth daily. Take 30-60 min before first meal of the day 08/14/17  Yes Wert, Christena Deem, MD  polyethylene glycol powder (GLYCOLAX/MIRALAX) powder Take 17 g by mouth 2 (two) times daily as needed. Patient taking differently: Take 17 g by mouth 2 (two) times daily as needed for mild constipation.  03/14/16  Yes Wardell Honour, MD  senna-docusate (SENOKOT-S) 8.6-50 MG tablet Take 1 tablet by mouth daily.   Yes [provider]  tetrahydrozoline 0.05 % ophthalmic solution Place 1 drop into both eyes as needed (dry eyes).   Yes [provider]    Family History Family History  Problem Relation Age of Onset  . Heart disease Brother   . Throat cancer Brother   . Other Mother        UNSURE  . Other Father        UNSURE  . Emphysema Sister   . Emphysema Sister   . Other Brother        UNSURE  . Diabetes Unknown     Social History Social History   Tobacco Use  . Smoking status: Former Smoker    Packs/day: 0.50    Years: 20.00    Pack years: 10.00    Types: Cigarettes, Pipe, Cigars    Last attempt to quit: 12/08/1996    Years since quitting: 21.0  . Smokeless tobacco: Never Used  . Tobacco comment:    Substance Use Topics  . Alcohol use: No    Alcohol/week: 0.0 standard drinks    Frequency: Never    Comment: last since 1998  . Drug use: Never     Allergies   Dyazide [hydrochlorothiazide w-triamterene] and Lisinopril   Review of Systems Review of Systems  Unable to  perform ROS: Mental status change     Physical Exam Updated Vital Signs BP 129/62 (BP Location: Right Arm)   Pulse 93   Temp 98.7 F (37.1 C) (Oral)   Resp 18   SpO2 90%   Physical Exam  Constitutional: He appears well-developed and well-nourished. No distress.  HENT:  Head: Normocephalic and atraumatic.  Mouth/Throat: Mucous membranes are dry.  Eyes: Conjunctivae are normal.  Neck: Neck supple.  Cardiovascular: Normal rate, regular rhythm,  normal heart sounds and intact distal pulses.  Pulmonary/Chest: Effort normal and breath sounds normal. No respiratory distress.  Presenting room air SPO2 88%.  Increased to 98% on 2 L supplemental O2.  Abdominal: Soft. There is no tenderness. There is no guarding.  Musculoskeletal: He exhibits no edema.  Lymphadenopathy:    He has no cervical adenopathy.  Neurological: He is alert.  Patient does not know why he was brought to the hospital or the date, but knows his name. Generalized weakness without noted focal deficits. Sensation to light touch grossly intact throughout the extremities. Motor function intact in each of the extremities.  Skin: Skin is warm and dry. He is not diaphoretic.  Poor skin turgor. Patient has what appears to be a stage II pressure ulcer to the sacral skin without exudate, active hemorrhage, or surrounding edema or erythema. Minor appearing pressure ulcers were noted to the bilateral lateral malleoli.  Psychiatric: He has a normal mood and affect. His behavior is normal.  Nursing note and vitals reviewed.    ED Treatments / Results  Labs (all labs ordered are listed, but only abnormal results are displayed) Labs Reviewed  COMPREHENSIVE METABOLIC PANEL - Abnormal; Notable for the following components:      Result Value   Creatinine, Ser 1.71 (*)    Calcium 8.1 (*)    Total Protein 6.2 (*)    Albumin 2.0 (*)    GFR calc non Af Amer 36 (*)    GFR calc Af Amer 42 (*)    All other components within normal  limits  CBC WITH DIFFERENTIAL/PLATELET - Abnormal; Notable for the following components:   WBC 13.9 (*)    RBC 3.49 (*)    Hemoglobin 9.4 (*)    HCT 31.7 (*)    MCHC 29.7 (*)    Neutro Abs 11.6 (*)    Monocytes Absolute 1.1 (*)    All other components within normal limits  CULTURE, BLOOD (ROUTINE X 2)  CULTURE, BLOOD (ROUTINE X 2)  URINE CULTURE  URINALYSIS, ROUTINE W REFLEX MICROSCOPIC  I-STAT CG4 LACTIC ACID, ED  I-STAT CG4 LACTIC ACID, ED    EKG EKG Interpretation  Date/Time:  Monday December 16 2017 14:14:50 EST Ventricular Rate:  84 PR Interval:    QRS Duration: 113 QT Interval:  385 QTC Calculation: 456 R Axis:   -65 Text Interpretation:  Sinus or ectopic atrial rhythm Left anterior fascicular block Probable left ventricular hypertrophy Anterior Q waves, possibly due to LVH Confirmed by Sherwood Gambler [phone]) on 12/16/2017 2:33:39 PM   Radiology No results found.  Procedures Procedures (including critical care time)  Medications Ordered in ED Medications  sodium chloride 0.9 % bolus 1,000 mL (1,000 mLs Intravenous New Bag/Given 12/16/17 1444)     Initial Impression / Assessment and Plan / ED Course  I have reviewed the triage vital signs and the nursing notes.  Pertinent labs & imaging results that were available during my care of the patient were reviewed by me and considered in my medical decision making (see chart for details).     Patient presents with generalized weakness, cough, and some confusion. Patient is nontoxic appearing, afebrile, not tachycardic, and not hypotensive.  However, patient is hypoxic and has clinical signs of dehydration on exam.  End of shift patient care handoff report given to Leonia Reader, EM resident. Plan: Chest x-ray pending.  Review chest x-ray and admit.   Findings and plan of care discussed with Sherwood Gambler, MD. Dr. Regenia Skeeter  personally evaluated and examined this patient.  Final Clinical Impressions(s) / ED  Diagnoses   Final diagnoses:  None    ED Discharge Orders    None       Layla Maw 12/16/17 1613    Sherwood Gambler, MD 12/17/17 1513

## 2017-12-16 NOTE — ED Triage Notes (Signed)
Pt BIB [facility] from home after home health came out today and said pt possibly has pneumonia. Hx COPD and recurrent pneumonia. Bed sores on hips and feet, possible confusion, EMS states he thinks it's 1919 but answers all other questions appropriately. Denies CP, SOB. Pacemaker present.

## 2017-12-16 NOTE — Telephone Encounter (Signed)
Engineer, materials, with [facility] currently at a home visit with patient. She is reporting a decline in the patient's health after a recent hospital stay on 11/24/17 with pneumonia. His oxygen is 89-93%RA, this is lower than his normal being 96-97% with COPD and CHF. Lungs are junky greater on the right. Temp. 99.3 oral today, 115/75, HR 100 after a nebulizer treatment. Stated he is weaker than last week and is in bed more than last week with evidence of stage 1 pressure ulcers. Weak cough and a decreased appetite the last 3 days. Recommended 911 at this time. They will be transported to [facility] via ambulance.   Answer Assessment - Initial Assessment Questions 1. REASON FOR CALL or QUESTION: "What is your reason for calling today?" or "How can I best help you?" or "What question do you have that I can help answer?"     Angel with [facility] is at a home visit with the patient and calling to report a decline in his condition.  Protocols used: INFORMATION ONLY CALL-A-AH

## 2017-12-16 NOTE — H&P (Signed)
TRH H&P   Patient Demographics:    Chad [name], is a 84 y.o. male  MRN: [id number]   DOB - 1931/04/20  Admit Date - 12/16/2017  Outpatient Primary MD for the patient is Plotnikov, Evie Lacks, MD      Patient coming from: Home  Chief Complaint  Patient presents with  . possible pneumonia      HPI:    Chad [name]  is a 84 y.o. male, with history of paroxysmal atrial fibrillation [place] vas 2 score of at least 5 including history of CVA/TIA on Eliquis, CAD, aspiration pneumonia recently admitted for it, failure to thrive, severe protein calorie malnutrition, COPD, sacral decubitus ulcer stage unknown who was recently admitted to the hospital for acute hypoxic respiratory failure due to aspiration pneumonia and was discharged home about 3 weeks ago comes back brought in by family with chief complaints of cough, fever, decreased oral intake and decreased mental status.  Patient currently is somnolent history obtained by son who is bedside, according to the son for the last 7 to 8 days patient has been gradually developing a cough, low-grade fevers, has not been eating or drinking well, since this morning he had been confused at home and was hence brought to the ER where he was diagnosed with aspiration pneumonia and I was called to admit.  Son denies any known event of aspiration, he agrees that patient has been gradually declining and now has been wheelchair and bedbound for the last month.    Review of systems:    Review of systems unobtainable due to patient being encephalopathic.   With Past History of the following :    Past Medical History:  Diagnosis Date  . Arthritis    "hands" (01/12/2013); feet (podiatry  consultation); "back" (06/12/2017)  . Atrial flutter ([place])    a. Slow ~100bpm when in 2:1; dx 04/2012. b. Placed on Xarelto.  c. s/p ablation 07-08-2012 by Dr Rayann Heman  . Carotid artery calcification    By CXR - dopplers 05/06/12 without obvious evidence of significant ICA stenosis >40%  . Colon polyps 06/26/2005  . COPD (chronic obstructive pulmonary disease) ([place])   . Diverticulosis of colon (without mention of hemorrhage)   . Gastritis, chronic    Pt denies bleeding 04/2012. EGD 2012 reportedly normal.  . GERD (gastroesophageal reflux disease)   .  Hypertension   . Hyponatremia    04/2012 r/t diuretic.  . IBS (irritable bowel syndrome)   . Internal and external hemorrhoids without complication   . Macular degeneration of left eye   . Pneumonia    "@ least 5 times" (06/12/2017)  . Stroke ([place])   . Tongue cancer ([place]) 02/1997      Past Surgical History:  Procedure Laterality Date  . ATRIAL FLUTTER ABLATION N/A 07/08/2012   Procedure: ATRIAL FLUTTER ABLATION;  Surgeon: Thompson Grayer, MD;  Location: [facility] CATH LAB;  Service: Cardiovascular;  Laterality: N/A;  . CARDIAC CATHETERIZATION  04/2012  . CATARACT EXTRACTION W/ INTRAOCULAR LENS IMPLANT Left   . COLONOSCOPY  08/29/10   diverticulosis, internal hemorrhoids  . ESOPHAGOGASTRODUODENOSCOPY  09/20/10   normal  . [place]  . LEFT HEART CATHETERIZATION WITH CORONARY ANGIOGRAM N/A 05/06/2012   Procedure: LEFT HEART CATHETERIZATION WITH CORONARY ANGIOGRAM;  Surgeon: Peter M Martinique, MD;  Location: [facility] CATH LAB;  Service: Cardiovascular;  Laterality: N/A;  . 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 PACEMAKER IMPLANT N/A 08/01/2017   Procedure: PACEMAKER IMPLANT;  Surgeon: Constance Haw, MD;  Location: [facility] CV LAB;  Service: Cardiovascular;  Laterality: N/A;  . TRIGGER FINGER RELEASE Right 1970's   "2 fingers"  . WART FULGURATION Left 09/15/2015   Procedure: EXCISIONal biospy of left peri anual and anual canal  mass;  Surgeon: Greer Pickerel, MD;  Location: [facility] ORS;  Service: General;  Laterality: Left;      Social History:     Social History   Tobacco Use  . Smoking status: Former Smoker    Packs/day: 0.50    Years: 20.00    Pack years: 10.00    Types: Cigarettes, Pipe, Cigars    Last attempt to quit: 12/08/1996    Years since quitting: 21.0  . Smokeless tobacco: Never Used  . Tobacco comment:    Substance Use Topics  . Alcohol use: No    Alcohol/week: 0.0 standard drinks    Frequency: Never    Comment: last since 1998         Family History :     Family History  Problem Relation Age of Onset  . Heart disease Brother   . Throat cancer Brother   . Other Mother        UNSURE  . Other Father        UNSURE  . Emphysema Sister   . Emphysema Sister   . Other Brother        UNSURE  . Diabetes Unknown        Home Medications:   Prior to Admission medications   Medication Sig Start Date End Date Taking? Authorizing Provider  apixaban (ELIQUIS) 2.5 MG TABS tablet Take 1 tablet (2.5 mg total) by mouth 2 (two) times daily. Resume 7/28/19PM Patient taking differently: Take 2.5 mg by mouth 2 (two) times daily.  08/07/17  Yes Lelon Perla, MD  atorvastatin (LIPITOR) 40 MG tablet Take 1 tablet (40 mg total) by mouth daily at 6 PM. Patient taking differently: Take 20 mg by mouth daily at 6 PM.  08/21/17  Yes Plotnikov, Evie Lacks, MD  cholecalciferol (VITAMIN D) 1000 units tablet Take 1 tablet (1,000 Units total) by mouth daily. 10/15/16  Yes Plotnikov, Evie Lacks, MD  clidinium-chlordiazePOXIDE (LIBRAX) 5-2.5 MG capsule Take 1 capsule by mouth 2 (two) times daily.   Yes [provider]  cyanocobalamin 1000 MCG tablet  Take 1,000 mcg by mouth daily.    Yes [provider]  escitalopram (LEXAPRO) 10 MG tablet Take 10 mg by mouth every morning.  11/29/17  Yes [provider]  famotidine (PEPCID) 20 MG tablet Take 1 tablet (20 mg total) by mouth at bedtime.  08/14/17  Yes Tanda Rockers, MD  feeding supplement, ENSURE ENLIVE, (ENSURE ENLIVE) LIQD Take 237 mLs by mouth 2 (two) times daily between meals. Patient taking differently: Take 237 mLs by mouth every other day.  11/27/17  Yes Lavina Hamman, MD  fluticasone (FLONASE) 50 MCG/ACT nasal spray USE 2 SPRAYS IN EACH NOSTRIL DAILY Patient taking differently: Place 2 sprays into both nostrils daily as needed for allergies or rhinitis.  08/15/17  Yes Plotnikov, Evie Lacks, MD  gabapentin (NEURONTIN) 300 MG capsule Take 3 capsules at bedtime. Patient taking differently: Take 900 mg by mouth at bedtime.  11/15/17  Yes Plotnikov, Evie Lacks, MD  ipratropium (ATROVENT) 0.02 % nebulizer solution Take 2.5 mLs (0.5 mg total) by nebulization 3 (three) times daily. Via [facility] 10/16/17  Yes Plotnikov, Evie Lacks, MD  Maltodextrin-Xanthan Gum (RESOURCE THICKENUP CLEAR) POWD Take 1 g by mouth as needed. Patient taking differently: Take by mouth See admin instructions. Mix/use in all liquids- sodas, water, etc.. 11/27/17  Yes Lavina Hamman, MD  mirtazapine (REMERON) 15 MG tablet Take 15 mg by mouth at bedtime.  11/29/17  Yes [provider]  multivitamin-lutein (OCUVITE-LUTEIN) CAPS capsule Take 1 capsule by mouth daily.    Yes [provider]  pantoprazole (PROTONIX) 40 MG tablet Take 1 tablet (40 mg total) by mouth daily. Take 30-60 min before first meal of the day 08/14/17  Yes Wert, Christena Deem, MD  polyethylene glycol powder (GLYCOLAX/MIRALAX) powder Take 17 g by mouth 2 (two) times daily as needed. Patient taking differently: Take 17 g by mouth 2 (two) times daily as needed for mild constipation.  03/14/16  Yes Wardell Honour, MD  senna-docusate (SENOKOT-S) 8.6-50 MG tablet Take 1 tablet by mouth daily.   Yes [provider]  tetrahydrozoline 0.05 % ophthalmic solution Place 1 drop into both eyes as needed (dry eyes).   Yes [provider]     Allergies:     Allergies  Allergen  Reactions  . Dyazide [Hydrochlorothiazide W-Triamterene] Other (See Comments)    Caused hyponatremia 04/2012  . Lisinopril Cough     Physical Exam:   Vitals  Blood pressure 135/69, pulse 82, temperature 99.7 F (37.6 C), temperature source Rectal, resp. rate (!) 22, height 6\' 3"  (1.905 m), weight 73 kg, SpO2 98 %.   1. General elderly frail white male lying in hospital bed somewhat confused appears tired,  2.  He is unable to follow commands reliably but moves all 4 extremities.  3. No F.N deficits, ALL C.Nerves Intact, Strength 5/5 all 4 extremities, Sensation intact all 4 extremities, Plantars down going.  4. Ears and Eyes appear Normal, Conjunctivae clear, PERRLA. Moist Oral Mucosa.  5. Supple Neck, No JVD, No cervical lymphadenopathy appriciated, No Carotid Bruits.  6. Symmetrical Chest wall movement, Good air movement bilaterally, coarse bilateral breath sounds  7. RRR, No Gallops, Rubs or Murmurs, No Parasternal Heave.  8. Positive Bowel Sounds, Abdomen Soft, No tenderness, No organomegaly appriciated,No rebound -guarding or rigidity.  9.  No Cyanosis, Normal Skin Turgor, No Skin Rash or Bruise.  Note he has stage II pressure sores on both heels lateral aspect, also has stage II-III sacral decubitus ulcer  present on admission  10. Good muscle tone,  joints appear normal , no effusions, Normal ROM.  11. No Palpable Lymph Nodes in Neck or Axillae      Data Review:    CBC Recent Labs  Lab 12/16/17 1351  WBC 13.9*  HGB 9.4*  HCT 31.7*  PLT 324  MCV 90.8  MCH 26.9  MCHC 29.7*  RDW 14.8  LYMPHSABS 1.1  MONOABS 1.1*  EOSABS 0.0  BASOSABS 0.0   ------------------------------------------------------------------------------------------------------------------  Chemistries  Recent Labs  Lab 12/16/17 1351  NA 143  K 3.9  CL 103  CO2 29  GLUCOSE 97  BUN 22  CREATININE 1.71*  CALCIUM 8.1*  AST 37  ALT 25  ALKPHOS 79  BILITOT 0.7    ------------------------------------------------------------------------------------------------------------------ estimated creatinine clearance is 33.2 mL/min (A) (by C-G formula based on SCr of 1.71 mg/dL (H)). ------------------------------------------------------------------------------------------------------------------ No results for input(s): TSH, T4TOTAL, T3FREE, THYROIDAB in the last 72 hours.  Invalid input(s): FREET3  Coagulation profile No results for input(s): INR, PROTIME in the last 168 hours. ------------------------------------------------------------------------------------------------------------------- No results for input(s): DDIMER in the last 72 hours. -------------------------------------------------------------------------------------------------------------------  Cardiac Enzymes No results for input(s): CKMB, TROPONINI, MYOGLOBIN in the last 168 hours.  Invalid input(s): CK ------------------------------------------------------------------------------------------------------------------    Component Value Date/Time   BNP 352.9 (H) 11/24/2017 1910     ---------------------------------------------------------------------------------------------------------------  Urinalysis    Component Value Date/Time   COLORURINE YELLOW 11/24/2017 1700   APPEARANCEUR CLEAR 11/24/2017 1700   LABSPEC 1.017 11/24/2017 1700   PHURINE 5.0 11/24/2017 1700   GLUCOSEU NEGATIVE 11/24/2017 1700   GLUCOSEU NEGATIVE 04/24/2016 0728   HGBUR NEGATIVE 11/24/2017 1700   BILIRUBINUR NEGATIVE 11/24/2017 1700   BILIRUBINUR negative 05/31/2015 0910   BILIRUBINUR Negative 04/28/2014 1516   KETONESUR NEGATIVE 11/24/2017 1700   PROTEINUR NEGATIVE 11/24/2017 1700   UROBILINOGEN 0.2 04/24/2016 0728   NITRITE NEGATIVE 11/24/2017 1700   LEUKOCYTESUR NEGATIVE 11/24/2017 1700     ----------------------------------------------------------------------------------------------------------------   Imaging Results:    Dg Chest 2 View  Result Date: 12/16/2017 CLINICAL DATA:  Shortness of breath EXAM: CHEST - 2 VIEW COMPARISON:  11/24/2017, 08/14/2017 FINDINGS: Left-sided pacing device as before. Worsening bilateral lung base consolidation posteriorly. No pleural effusion. Mild cardiomegaly with aortic atherosclerosis. No pneumothorax. IMPRESSION: 1. Worsening bilateral lung base consolidations, possible pneumonia; imaging follow-up to resolution recommended to exclude underlying mass lesion given somewhat irregular appearance of the consolidation on the lateral view. Probable small left effusion. 2. Mild cardiomegaly. Electronically Signed   By: Donavan Foil M.D.   On: 12/16/2017 16:57    My personal review of EKG: Rhythm NSR,  no Acute ST changes   Assessment & Plan:     1.  Acute hypoxic respiratory failure due to recurrent aspiration pneumonia.  Patient appears extremely frail and deconditioned, this is his second episode of aspiration pneumonia in 3 weeks, I think he is now in his terminal decline.  Discussed the plan with son in detail, they want gentle medical measures like antibiotics and IV fluids which will be done, will be placed on Unasyn for now, follow cultures, will keep him n.p.o. except medications, speech to evaluate in the morning.  Will also involve palliative care for long-term goals of care.  If he declines family is open for comfort measures.  2.  Paroxysmal atrial fibrillation with [place] vas 2 score of at least 4 with history of TIA/CVA.  Currently not on rate controlling agents, continue Eliquis per pharmacy.  3.  GERD.  On PPI.  4.  Dyslipidemia.  On statin continue.  5.  HX of CVA/TIA.  Continue Eliquis and statin for secondary prevention.  6.  History of CAD.  No acute issues.  Supportive care.    7. Failure to thrive with severe protein  calorie malnutrition.  Protein supplements, palliative care for goals of care.    DVT Prophylaxis Eliquis  AM Labs Ordered, also please review Full Orders  Family Communication: Admission, patients condition and plan of care including tests being ordered have been discussed with the patient and SON who indicate understanding and agree with the plan and Code Status.  Code Status DNR  Likely DC to  TBD  Condition GUARDED    Consults called: None    Admission status: Inpt    Time spent in minutes : 35   Lala Lund M.D on 12/16/2017 at 6:12 PM  To page go to [email or web address] - password [facility]

## 2017-12-16 NOTE — ED Provider Notes (Signed)
  Physical Exam  BP 109/75   Pulse 82   Temp 99.7 F (37.6 C) (Rectal)   Resp (!) 26   Ht 6\' 3"  (1.905 m)   Wt 73 kg   SpO2 97%   BMI 20.12 kg/m   Physical Exam  Constitutional: He appears well-developed and well-nourished. He is sleeping. Nasal cannula in place.  HENT:  Head: Normocephalic and atraumatic.  Eyes: Conjunctivae are normal.  Neck: Neck supple.  Cardiovascular: Normal rate and regular rhythm.  No murmur heard. Pulmonary/Chest: Effort normal and breath sounds normal. No respiratory distress.  Abdominal: Soft. There is no tenderness.  Musculoskeletal: He exhibits no edema.  Skin: Skin is warm and dry.  Psychiatric: He has a normal mood and affect.  Nursing note and vitals reviewed.  ED Course/Procedures     Procedures  MDM  Discharged on 11/20 from PNA and dehydration. Not been eating well, hates the diet. Weaker and weaker since discharge.  Last three days, wont get up or move around. Visual hallucinations. Increase WOB. Increase O2 requirement, 2L [place] ([id number]) CXR shows consolidations. Gave Cefepime and Azithromycin.  Admit.  Dehydrated on exam - 1L given      Erskine Squibb, MD 12/16/17 1742    Mesner, Corene Cornea, MD 12/16/17 2220

## 2017-12-16 NOTE — Telephone Encounter (Signed)
Pt at ED.

## 2017-12-16 NOTE — Progress Notes (Signed)
ANTICOAGULATION + ANTIMICROBIAL CONSULT NOTE - Initial Consult  Pharmacy Consult for Eliquis + Unasyn Indication: atrial fibrillation + pneumonia   Patient Measurements: Height: 6\' 3"  (190.5 cm) Weight: 161 lb (73 kg) IBW/kg (Calculated) : 84.5   Vital Signs: Temp: 99.7 F (37.6 C) (12/09 1442) Temp Source: Rectal (12/09 1442) BP: 135/69 (12/09 1630) Pulse Rate: 82 (12/09 1630)  Labs: Recent Labs    12/16/17 1351  HGB 9.4*  HCT 31.7*  PLT 324  CREATININE 1.71*     Assessment:  84 yo male admitted with cough, low-grade fevers and poor appetite.  Received one dose of cefepime and azithromycin in the ED. Planning to change over to Unasyn. The patient's SCr is borderline for adjusting the frequency of Unasyn, will need to follow closely.   Patient is on Eliquis prior to admission for AFlutter. He is on the low dose due to his age and SCr. Last dose was this morning.   Goal of Therapy:  Monitor platelets by anticoagulation protocol: Yes   Plan:  -Eliquis 2.5 mg po bid -Monitor s/sx bleeding -Unasyn 3 g IV q12h -Monitor renal fx, cultures, length of therapy   Harvel Quale 12/16/2017,5:46 PM

## 2017-12-17 DIAGNOSIS — Z515 Encounter for palliative care: Secondary | ICD-10-CM

## 2017-12-17 DIAGNOSIS — Z7189 Other specified counseling: Secondary | ICD-10-CM

## 2017-12-17 DIAGNOSIS — E7849 Other hyperlipidemia: Secondary | ICD-10-CM

## 2017-12-17 DIAGNOSIS — I639 Cerebral infarction, unspecified: Secondary | ICD-10-CM

## 2017-12-17 LAB — BASIC METABOLIC PANEL
Anion gap: 11 (ref 5–15)
BUN: 20 mg/dL (ref 8–23)
CO2: 27 mmol/L (ref 22–32)
Calcium: 8 mg/dL — ABNORMAL LOW (ref 8.9–10.3)
Chloride: 109 mmol/L (ref 98–111)
Creatinine, Ser: 1.47 mg/dL — ABNORMAL HIGH (ref 0.61–1.24)
GFR calc Af Amer: 50 mL/min — ABNORMAL LOW (ref >60)
GFR calc non Af Amer: 43 mL/min — ABNORMAL LOW (ref >60)
Glucose, Bld: 72 mg/dL (ref 70–99)
Potassium: 3.5 mmol/L (ref 3.5–5.1)
SODIUM: 147 mmol/L — AB (ref 135–145)

## 2017-12-17 LAB — CBC
HCT: 32.4 % — ABNORMAL LOW (ref 39.0–52.0)
Hemoglobin: 10 g/dL — ABNORMAL LOW (ref 13.0–17.0)
MCH: 27.6 pg (ref 26.0–34.0)
MCHC: 30.9 g/dL (ref 30.0–36.0)
MCV: 89.5 fL (ref 80.0–100.0)
Platelets: 280 10*3/uL (ref 150–400)
RBC: 3.62 MIL/uL — ABNORMAL LOW (ref 4.22–5.81)
RDW: 14.7 % (ref 11.5–15.5)
WBC: 10.1 10*3/uL (ref 4.0–10.5)
nRBC: 0 % (ref 0.0–0.2)

## 2017-12-17 LAB — URINALYSIS, ROUTINE W REFLEX MICROSCOPIC
Bilirubin Urine: NEGATIVE
Glucose, UA: NEGATIVE mg/dL
Hgb urine dipstick: NEGATIVE
Ketones, ur: NEGATIVE mg/dL
Nitrite: POSITIVE — AB
Protein, ur: NEGATIVE mg/dL
Specific Gravity, Urine: 1.019 (ref 1.005–1.030)
pH: 5 (ref 5.0–8.0)

## 2017-12-17 MED ORDER — PRO-STAT SUGAR FREE PO LIQD
30.0000 mL | Freq: Two times a day (BID) | ORAL | Status: DC
  Administered 2017-12-18 – 2017-12-19 (×3): 30 mL via ORAL
  Filled 2017-12-17 (×3): qty 30

## 2017-12-17 MED ORDER — SODIUM CHLORIDE 0.9 % IV SOLN
3.0000 g | Freq: Three times a day (TID) | INTRAVENOUS | Status: DC
  Administered 2017-12-17 – 2017-12-19 (×6): 3 g via INTRAVENOUS
  Filled 2017-12-17 (×8): qty 3

## 2017-12-17 MED ORDER — ENSURE ENLIVE PO LIQD
237.0000 mL | Freq: Three times a day (TID) | ORAL | Status: DC
  Administered 2017-12-17 – 2017-12-18 (×3): 237 mL via ORAL

## 2017-12-17 MED ORDER — ADULT MULTIVITAMIN W/MINERALS CH
1.0000 | ORAL_TABLET | Freq: Every day | ORAL | Status: DC
  Administered 2017-12-17 – 2017-12-19 (×3): 1 via ORAL
  Filled 2017-12-17 (×3): qty 1

## 2017-12-17 NOTE — Consult Note (Signed)
Consultation Note Date: 12/17/2017   Patient Name: Chad [name]  DOB: August 25, 1931  MRN: [id number]  Age / Sex: 84 y.o., male  PCP: Plotnikov, Evie Lacks, MD Referring Physician: Thurnell Lose, MD  Reason for Consultation: Establishing goals of care  HPI/Patient Profile: 84 y.o. male  with past medical history of a fib on eliquis, CVA/TIA, CAD, recurrent aspiration pneumonia, COPD, GERD, and HTN admitted on 12/16/2017 with cough, fever, and decreased oral intake. He was recently admitted 11/17-11/20 aspiration pna and failure to thrive. He was discharged home with home health.  Diagnosed with acute hypoxic respiratory failure due to recurrent aspiration pneumonia. PMT consulted for goals of care.  Clinical Assessment and Goals of Care: I have reviewed medical records including EPIC notes, labs and imaging, assessed the patient and then met with patient's son, Chad [name],  to discuss diagnosis prognosis, GOC, EOL wishes, disposition and options.  I introduced Palliative Medicine as specialized medical care for people living with serious illness. It focuses on providing relief from the symptoms and stress of a serious illness. The goal is to improve quality of life for both the patient and the family.  As far as functional and nutritional status, Jashan tells me patient spent majority of his time in bed and required assistance for ADLs. He tells me the patient had minimal PO intake. 40 pound weight loss in 3-4 months.    We discussed his current illness and what it means in the larger context of his on-going co-morbidities.  Natural disease trajectory and expectations at EOL were discussed. Specifically discussed overall failure to thrive. Discussed dysphagia and likely recurrence of aspiration pneumonia. Patient does not enjoy thickened liquids - family agrees for patient to be allowed PO intake that provides comfort and accepts risks of  aspiration.   I attempted to elicit values and goals of care important to the patient.  They tell me the patient would want to be comfortable and be at home.   The difference between aggressive medical intervention and comfort care was considered in light of the patient's goals of care. They agree the focus of his care should be his comfort.   Advance directives, concepts specific to code status, artifical feeding and hydration, and rehospitalization were considered and discussed. Patient is DNR and would not want life artificially prolonged.   Hospice and Palliative Care services outpatient were explained and offered. Family interested in hospice care at home. Will place case management consult.  Questions and concerns were addressed. The family was encouraged to call with questions or concerns.   Primary Decision Maker NEXT OF KIN - Son - Chad [name], Chad [name]  SUMMARY OF RECOMMENDATIONS   - family with good understanding of likely recurrence of aspiration pna, failure to thrive - request to focus on patient's comfort - allow patient PO intake that provides most comfort and accept aspiration risk - agreeable to hospice support at home - case management consult placed  Code Status/Advance Care Planning:  DNR  Symptom Management:   None   Palliative Prophylaxis:   Aspiration, Bowel Regimen, Frequent Pain Assessment and Turn Reposition  Additional Recommendations (Limitations, Scope, Preferences):  Avoid Hospitalization, Initiate Comfort Feeding and No Artificial Feeding  Psycho-social/Spiritual:   Desire for further Chaplaincy support:no  Additional Recommendations: Education on Hospice  Prognosis:   < 3 months  Discharge Planning: Home with Hospice      Primary Diagnoses: Present on Admission: . Aspiration pneumonia ([place]) . Acute respiratory failure with hypoxemia ([place]) . CAD (  coronary artery disease) . Chronic kidney disease (CKD), stage IV (severe) ([place]) . COPD  (chronic obstructive pulmonary disease) with chronic bronchitis ([place]) . DNR (do not resuscitate) . CVA (cerebral vascular accident) ([place]) . Paroxysmal atrial flutter ([place]) . HLD (hyperlipidemia)   I have reviewed the medical record, interviewed the patient and family, and examined the patient. The following aspects are pertinent.  Past Medical History:  Diagnosis Date  . Arthritis    "hands" (01/12/2013); feet (podiatry consultation); "back" (06/12/2017)  . Atrial flutter ([place])    a. Slow ~100bpm when in 2:1; dx 04/2012. b. Placed on Xarelto.  c. s/p ablation 07-08-2012 by Dr Rayann Heman  . Carotid artery calcification    By CXR - dopplers 05/06/12 without obvious evidence of significant ICA stenosis >40%  . Colon polyps 06/26/2005  . COPD (chronic obstructive pulmonary disease) ([place])   . Diverticulosis of colon (without mention of hemorrhage)   . Gastritis, chronic    Pt denies bleeding 04/2012. EGD 2012 reportedly normal.  . GERD (gastroesophageal reflux disease)   . Hypertension   . Hyponatremia    04/2012 r/t diuretic.  . IBS (irritable bowel syndrome)   . Internal and external hemorrhoids without complication   . Macular degeneration of left eye   . Pneumonia    "@ least 5 times" (06/12/2017)  . Stroke ([place])   . Tongue cancer ([place]) 02/1997   Social History   Socioeconomic History  . Marital status: Divorced    Spouse name: Not on file  . Number of children: 2  . Years of education: 47  . Highest education level: Not on file  Occupational History  . Occupation: Retired  Scientific laboratory technician  . Financial resource strain: Not on file  . Food insecurity:    Worry: Not on file    Inability: Not on file  . Transportation needs:    Medical: Not on file    Non-medical: Not on file  Tobacco Use  . Smoking status: Former Smoker    Packs/day: 0.50    Years: 20.00    Pack years: 10.00    Types: Cigarettes, Pipe, Cigars    Last attempt to quit: 12/08/1996    Years since quitting: 21.0  .  Smokeless tobacco: Never Used  . Tobacco comment:    Substance and Sexual Activity  . Alcohol use: No    Alcohol/week: 0.0 standard drinks    Frequency: Never    Comment: last since 1998  . Drug use: Never  . Sexual activity: Not Currently  Lifestyle  . Physical activity:    Days per week: Not on file    Minutes per session: Not on file  . Stress: Not on file  Relationships  . Social connections:    Talks on phone: Not on file    Gets together: Not on file    Attends religious service: Not on file    Active member of club or organization: Not on file    Attends meetings of clubs or organizations: Not on file    Relationship status: Not on file  Other Topics Concern  . Not on file  Social History Narrative   Marital status: divorced; lives with ex-wife; not dating in 2017.      Children:  2 sons, 2 grandsons, 1 granddaughter; no gg.      Lives: with ex-wife in house.      Employment: retired first time age 83; retired in 1997.  Barista x 25 years; Actor.  Tobacco: quit smoking 1997; smoked for 27 years.      Alcohol: quit 2004      Exercise:  Walking daily short distances.       ADLs:  NO assistant devices; has cane if needs it; drives; pays bill; grocery shopping by wife; wife cleans house and does laundry.     Advanced Directives:  +LIVING WILL; +FULL CODE.  HCPOA: oldest son Chad Kissner Sr.)   Family History  Problem Relation Age of Onset  . Heart disease Brother   . Throat cancer Brother   . Other Mother        UNSURE  . Other Father        UNSURE  . Emphysema Sister   . Emphysema Sister   . Other Brother        UNSURE  . Diabetes Unknown    Scheduled Meds: . apixaban  2.5 mg Oral BID  . atorvastatin  20 mg Oral q1800  . escitalopram  10 mg Oral BH-q7a  . famotidine  20 mg Oral QHS  . feeding supplement (PRO-STAT SUGAR FREE 64)  30 mL Oral TID WC  . pantoprazole  40 mg Oral Daily  . sodium chloride flush  3 mL Intravenous  Q12H   Continuous Infusions: . ampicillin-sulbactam (UNASYN) IV    . lactated ringers 1,000 mL (12/17/17 0758)   PRN Meds:.acetaminophen **OR** acetaminophen, bisacodyl, fluticasone, metoprolol tartrate, naphazoline-glycerin, polyethylene glycol powder Allergies  Allergen Reactions  . Dyazide [Hydrochlorothiazide W-Triamterene] Other (See Comments)    Caused hyponatremia 04/2012  . Lisinopril Cough   Review of Systems  Unable to perform ROS: Mental status change    Physical Exam  Constitutional: No distress.  HENT:  Head: Normocephalic and atraumatic.  Cardiovascular: Normal rate and regular rhythm.  Pulmonary/Chest: Effort normal and breath sounds normal.  Abdominal: Soft. Bowel sounds are normal.  Musculoskeletal: He exhibits no edema.  Neurological: He is alert.  Confused about situation  Skin: Skin is warm. He is not diaphoretic.  Psychiatric: Cognition and memory are impaired.    Vital Signs: BP (!) 165/79 (BP Location: Right Arm)   Pulse 89   Temp 97.6 F (36.4 C)   Resp 18   Ht _0  (1.905 m)   Wt 73 kg   SpO2 96%   BMI 20.12 kg/m  Pain Scale: 0-10 POSS *See Group Information*: 1-Acceptable,Awake and alert Pain Score: 0-No pain   SpO2: SpO2: 96 % O2 Device:SpO2: 96 % O2 Flow Rate: .O2 Flow Rate (L/min): 2 L/min  IO: Intake/output summary:   Intake/Output Summary (Last 24 hours) at 12/17/2017 1037 Last data filed at 12/17/2017 0945 Gross per 24 hour  Intake 1771.29 ml  Output 60 ml  Net 1711.29 ml    LBM: Last BM Date: 12/16/17 Baseline Weight: Weight: 73 kg Most recent weight: Weight: 73 kg     Palliative Assessment/Data: PPS 20%    Time Total: 70 minutes Greater than 50%  of this time was spent counseling and coordinating care related to the above assessment and plan.  Chad Burrow, DNP, AGNP-C Palliative Medicine Team [phone] Pager: [phone]

## 2017-12-17 NOTE — Progress Notes (Signed)
Pharmacy Antibiotic Note  Chad [name] is a 84 y.o. male admitted on 12/16/2017 with cough, low-grade fevers and poor appetite.  Pharmacy has been consulted for Unasyn dosing, D#1 today. Renal function improved.  Plan: Change Unasyn to 3 g IV q8h Monitor renal function, change to PO, and LOT   Height: 6\' 3"  (190.5 cm) Weight: 161 lb (73 kg) IBW/kg (Calculated) : 84.5  Temp (24hrs), Avg:98.4 F (36.9 C), Min:97.6 F (36.4 C), Max:99.7 F (37.6 C)  Recent Labs  Lab 12/16/17 1351 12/16/17 1401 12/16/17 1855 12/17/17 0532  WBC 13.9*  --   --  10.1  CREATININE 1.71*  --   --  1.47*  LATICACIDVEN  --  1.52 0.71  --     Estimated Creatinine Clearance: 38.6 mL/min (A) (by C-G formula based on SCr of 1.47 mg/dL (H)).    Allergies  Allergen Reactions  . Dyazide [Hydrochlorothiazide W-Triamterene] Other (See Comments)    Caused hyponatremia 04/2012  . Lisinopril Cough     Thank you for allowing pharmacy to be a part of this patient's care.  Renold Genta, PharmD, BCPS Clinical Pharmacist Clinical phone for 12/17/2017 until 3p is [phone] 12/17/2017 10:35 AM  **Pharmacist phone directory can now be found on [email or web address] listed under [place]**

## 2017-12-17 NOTE — Progress Notes (Signed)
PROGRESS NOTE                                                                                                                                                                                                             Patient Demographics:    Chad [name], is a 84 y.o. male, DOB - 08-31-1931, UXN:[id number]  Admit date - 12/16/2017   Admitting Physician Thurnell Lose, MD  Outpatient Primary MD for the patient is Plotnikov, Evie Lacks, MD  LOS - 1  Chief Complaint  Patient presents with  . possible pneumonia       Brief Narrative   Chad [name]  is a 84 y.o. male, with history of paroxysmal atrial fibrillation [place] vas 2 score of at least 5 including history of CVA/TIA on Eliquis, CAD, aspiration pneumonia recently admitted for it, failure to thrive, severe protein calorie malnutrition, COPD, sacral decubitus ulcer stage unknown who was recently admitted to the hospital for acute hypoxic respiratory failure due to aspiration pneumonia and was discharged home about 3 weeks ago comes back brought in by family with chief complaints of cough, fever, decreased oral intake and decreased mental status.  Patient currently is somnolent history obtained by son who is bedside, according to the son for the last 7 to 8 days patient has been gradually developing a cough, low-grade fevers, has not been eating or drinking well, since this morning he had been confused at home and was hence brought to the ER where he was diagnosed with aspiration pneumonia and I was called to admit.  Son denies any known event of aspiration, he agrees that patient has been gradually declining and now has been wheelchair and bedbound for the last month.    Subjective:    [name] today has, No headache, No chest pain, No abdominal pain - No Nausea, No new weakness tingling or numbness, improved Cough - SOB.     Assessment  & Plan :     1.  Acute hypoxic respiratory failure due to recurrent  aspiration pneumonia.  Patient appears extremely frail and deconditioned, this is his second episode of aspiration pneumonia in 3 weeks, I think he is now in his terminal decline.  plan was discussed with patient, son and wife, gentle medical Rx, comfort feeds, open to Hospice, ABX and IVF to continue for now.  2.  Paroxysmal atrial fibrillation with [place] vas 2 score of at least 4 with history of TIA/CVA.  Currently not on rate controlling agents,  continue Eliquis per pharmacy.  3.  GERD.  On PPI.  4.  Dyslipidemia.  On statin continue.  5.  HX of CVA/TIA.  Continue Eliquis and statin for secondary prevention.  6.  History of CAD.  No acute issues.  Supportive care.    7. Failure to thrive with severe protein calorie malnutrition. On pro-stat, palliative care meeting soon for long-term goals of care, family reasonable.    Family Communication  : son  Code Status :  DNR  Disposition Plan  :  TBD  Consults  :  Pall Care  Procedures  :      DVT Prophylaxis  :  Eliquis  Lab Results  Component Value Date   PLT 280 12/17/2017    Diet :  Diet Order            DIET DYS 2 Room service appropriate? Yes; Fluid consistency: Thin  Diet effective now               Inpatient Medications Scheduled Meds: . apixaban  2.5 mg Oral BID  . atorvastatin  20 mg Oral q1800  . escitalopram  10 mg Oral BH-q7a  . famotidine  20 mg Oral QHS  . feeding supplement (PRO-STAT SUGAR FREE 64)  30 mL Oral TID WC  . pantoprazole  40 mg Oral Daily  . sodium chloride flush  3 mL Intravenous Q12H   Continuous Infusions: . ampicillin-sulbactam (UNASYN) IV 3 g (12/17/17 1103)  . lactated ringers 1,000 mL (12/17/17 0758)   PRN Meds:.acetaminophen **OR** acetaminophen, bisacodyl, fluticasone, metoprolol tartrate, naphazoline-glycerin, polyethylene glycol powder  Antibiotics  :   Anti-infectives (From admission, onward)   Start     Dose/Rate Route Frequency Ordered Stop   12/17/17 1200   Ampicillin-Sulbactam (UNASYN) 3 g in sodium chloride 0.9 % 100 mL IVPB     3 g 200 mL/hr over 30 Minutes Intravenous Every 8 hours 12/17/17 1032     12/17/17 0100  Ampicillin-Sulbactam (UNASYN) 3 g in sodium chloride 0.9 % 100 mL IVPB  Status:  Discontinued     3 g 200 mL/hr over 30 Minutes Intravenous Every 12 hours 12/16/17 1804 12/17/17 1032   12/16/17 1800  vancomycin (VANCOCIN) 1,500 mg in sodium chloride 0.9 % 500 mL IVPB  Status:  Discontinued     1,500 mg 250 mL/hr over 120 Minutes Intravenous  Once 12/16/17 1728 12/16/17 1743   12/16/17 1715  cefTRIAXone (ROCEPHIN) 2 g in sodium chloride 0.9 % 100 mL IVPB  Status:  Discontinued     2 g 200 mL/hr over 30 Minutes Intravenous Every 24 hours 12/16/17 1709 12/16/17 1710   12/16/17 1715  azithromycin (ZITHROMAX) 500 mg in sodium chloride 0.9 % 250 mL IVPB  Status:  Discontinued     500 mg 250 mL/hr over 60 Minutes Intravenous Every 24 hours 12/16/17 1709 12/16/17 1710   12/16/17 1715  vancomycin (VANCOCIN) IVPB 1000 mg/200 mL premix  Status:  Discontinued     1,000 mg 200 mL/hr over 60 Minutes Intravenous  Once 12/16/17 1711 12/16/17 1728   12/16/17 1715  ceFEPIme (MAXIPIME) 2 g in sodium chloride 0.9 % 100 mL IVPB  Status:  Discontinued     2 g 200 mL/hr over 30 Minutes Intravenous  Once 12/16/17 1711 12/16/17 1814   12/16/17 1715  azithromycin (ZITHROMAX) 500 mg in sodium chloride 0.9 % 250 mL IVPB  Status:  Discontinued     500 mg 250 mL/hr over 60 Minutes Intravenous Every 24  hours 12/16/17 1711 12/17/17 1030          Objective:   Vitals:   12/16/17 1815 12/16/17 1830 12/16/17 2004 12/17/17 0428  BP:  132/67 134/69 (!) 165/79  Pulse: 78 80 78 89  Resp: (!) 21 19 18 18   Temp:   97.7 F (36.5 C) 97.6 F (36.4 C)  TempSrc:      SpO2: 100% 100% 100% 96%  Weight:      Height:        Wt Readings from Last 3 Encounters:  12/16/17 73 kg  12/03/17 76.2 kg  11/26/17 74.7 kg     Intake/Output Summary (Last 24 hours) at  12/17/2017 1145 Last data filed at 12/17/2017 1103 Gross per 24 hour  Intake 1871.29 ml  Output 60 ml  Net 1811.29 ml     Physical Exam  Awake Alert, Oriented X 3, No new F.N deficits, Normal affect [place].AT,PERRAL Supple Neck,No JVD, No cervical lymphadenopathy appriciated.  Symmetrical Chest wall movement, Good air movement bilaterally, basilar coarse sounds RRR,No Gallops,Rubs or new Murmurs, No Parasternal Heave +ve B.Sounds, Abd Soft, No tenderness, No organomegaly appriciated, No rebound - guarding or rigidity. No Cyanosis, Clubbing or edema, No new Rash or bruise       Data Review:    CBC Recent Labs  Lab 12/16/17 1351 12/17/17 0532  WBC 13.9* 10.1  HGB 9.4* 10.0*  HCT 31.7* 32.4*  PLT 324 280  MCV 90.8 89.5  MCH 26.9 27.6  MCHC 29.7* 30.9  RDW 14.8 14.7  LYMPHSABS 1.1  --   MONOABS 1.1*  --   EOSABS 0.0  --   BASOSABS 0.0  --     Chemistries  Recent Labs  Lab 12/16/17 1351 12/17/17 0532  NA 143 147*  K 3.9 3.5  CL 103 109  CO2 29 27  GLUCOSE 97 72  BUN 22 20  CREATININE 1.71* 1.47*  CALCIUM 8.1* 8.0*  AST 37  --   ALT 25  --   ALKPHOS 79  --   BILITOT 0.7  --    ------------------------------------------------------------------------------------------------------------------ No results for input(s): CHOL, HDL, LDLCALC, TRIG, CHOLHDL, LDLDIRECT in the last 72 hours.  Lab Results  Component Value Date   HGBA1C 5.6 06/04/2017   ------------------------------------------------------------------------------------------------------------------ Recent Labs    12/16/17 1847  TSH 3.619   ------------------------------------------------------------------------------------------------------------------ No results for input(s): VITAMINB12, FOLATE, FERRITIN, TIBC, IRON, RETICCTPCT in the last 72 hours.  Coagulation profile No results for input(s): INR, PROTIME in the last 168 hours.  No results for input(s): DDIMER in the last 72  hours.  Cardiac Enzymes No results for input(s): CKMB, TROPONINI, MYOGLOBIN in the last 168 hours.  Invalid input(s): CK ------------------------------------------------------------------------------------------------------------------    Component Value Date/Time   BNP 352.9 (H) 11/24/2017 1910    Micro Results No results found for this or any previous visit (from the past 240 hour(s)).  Radiology Reports Dg Chest 2 View  Result Date: 12/16/2017 CLINICAL DATA:  Shortness of breath EXAM: CHEST - 2 VIEW COMPARISON:  11/24/2017, 08/14/2017 FINDINGS: Left-sided pacing device as before. Worsening bilateral lung base consolidation posteriorly. No pleural effusion. Mild cardiomegaly with aortic atherosclerosis. No pneumothorax. IMPRESSION: 1. Worsening bilateral lung base consolidations, possible pneumonia; imaging follow-up to resolution recommended to exclude underlying mass lesion given somewhat irregular appearance of the consolidation on the lateral view. Probable small left effusion. 2. Mild cardiomegaly. Electronically Signed   By: Donavan Foil M.D.   On: 12/16/2017 16:57   Ct Head Wo Contrast  Result Date: 11/24/2017 CLINICAL DATA:  Altered mental status, fever, generalized weakness EXAM: CT HEAD WITHOUT CONTRAST TECHNIQUE: Contiguous axial images were obtained from the base of the skull through the vertex without intravenous contrast. COMPARISON:  08/01/2016 FINDINGS: Brain: No evidence of acute infarction, hemorrhage, hydrocephalus, extra-axial collection or mass lesion/mass effect. Encephalomalacic changes in the medial right frontal lobe. Subcortical white matter and periventricular small vessel ischemic changes. Vascular: Intracranial atherosclerosis. Skull: Normal. Negative for fracture or focal lesion. Sinuses/Orbits: The visualized paranasal sinuses are essentially clear. The mastoid air cells are unopacified. Other: None. IMPRESSION: No evidence of acute intracranial abnormality.  Encephalomalacic changes in the medial right frontal lobe. Small vessel ischemic changes. Electronically Signed   By: Julian Hy M.D.   On: 11/24/2017 15:42   Dg Chest Port 1 View  Result Date: 11/24/2017 CLINICAL DATA:  Altered level of consciousness EXAM: PORTABLE CHEST 1 VIEW COMPARISON:  08/14/2017 FINDINGS: Mild patchy bilateral lower lung opacities, suspicious for multifocal pneumonia, possibly on the basis of aspiration. No pleural effusion or pneumothorax. Biapical pleural-parenchymal scarring. The heart is normal in size. Left subclavian pacemaker. IMPRESSION: Mild patchy bilateral lower lung opacities, suspicious for multifocal pneumonia, possibly on the basis of aspiration. Electronically Signed   By: Julian Hy M.D.   On: 11/24/2017 15:39   Dg Swallowing Func-speech Pathology  Result Date: 11/26/2017 Objective Swallowing Evaluation: Type of Study: MBS-Modified Barium Swallow Study  Patient Details Name: FREDDIE DYMEK MRN: [id number] Date of Birth: 09-08-1931 Today's Date: 11/26/2017 Time: SLP Start Time (ACUTE ONLY): 1007 -SLP Stop Time (ACUTE ONLY): 9622 SLP Time Calculation (min) (ACUTE ONLY): 26 min Past Medical History: Past Medical History: Diagnosis Date . Arthritis   "hands" (01/12/2013); feet (podiatry consultation); "back" (06/12/2017) . Atrial flutter ([place])   a. Slow ~100bpm when in 2:1; dx 04/2012. b. Placed on Xarelto.  c. s/p ablation 07-08-2012 by Dr Rayann Heman . Carotid artery calcification   By CXR - dopplers 05/06/12 without obvious evidence of significant ICA stenosis >40% . Colon polyps 06/26/2005 . COPD (chronic obstructive pulmonary disease) ([place])  . Diverticulosis of colon (without mention of hemorrhage)  . Gastritis, chronic   Pt denies bleeding 04/2012. EGD 2012 reportedly normal. . GERD (gastroesophageal reflux disease)  . Hypertension  . Hyponatremia   04/2012 r/t diuretic. . IBS (irritable bowel syndrome)  . Internal and external hemorrhoids without complication  .  Macular degeneration of left eye  . Pneumonia   "@ least 5 times" (06/12/2017) . Stroke ([place])  . Tongue cancer ([place]) 02/1997 Past Surgical History: Past Surgical History: Procedure Laterality Date . ATRIAL FLUTTER ABLATION N/A 07/08/2012  Procedure: ATRIAL FLUTTER ABLATION;  Surgeon: Thompson Grayer, MD;  Location[facility] C[facility] LAB;  Service: Cardiovascular;  Laterality: N/A; . CARDIAC CATHETERIZATION  04/2012 . CATARACT EXTRACTION W/ INTRAOCULAR LENS IMPLANT Left  . COLONOSCOPY  08/29/10  diverticulosis, internal hemorrhoids . ESOPHAGOGASTRODUODENOSCOPY  09/20/10  normal . [place] . LEFT HEART CATHETERIZATION WITH CORONARY ANGIOGRAM N/A 05/06/2012  Procedure: LEFT HEART CATHETERIZATION WITH CORONARY ANGIOGRAM;  Surgeon: Peter M Martinique, MD;  Location: [facility] CATH LAB;  Service: Cardiovascular;  Laterality: N/A; . Oropharyngeal resection  02/1997  For tongue cancer . PACEMAKER IMPLANT N/A 08/01/2017  Procedure: PACEMAKER IMPLANT;  Surgeon: Constance Haw, MD;  Location: [facility] CV LAB;  Service: Cardiovascular;  Laterality: N/A; . TRIGGER FINGER RELEASE Right 1970's  "2 fingers" . WART FULGURATION Left 09/15/2015  Procedure: EXCISIONal biospy of left peri anual and anual canal mass;  Surgeon:  Greer Pickerel, MD;  Location: [facility] ORS;  Service: General;  Laterality: Left; HPI: Pt is a 84 y.o. male with a PMH significant for CAD, A. fib, HTN, COPD, GERD, pneumonia ("at least 5 times" 06/12/2017), stroke (May 2019), and tongue cancer (1998). He presented to [facility] ED from home on 11/24/17 with SOB and cough. Per wife, pt has had poor appetite, increasing weakness over last several days, and 2 recent falls (1 fall 11/15; another on 11/16). Head CT revealed no acute intracranial abnormality, but encephalomalacic changes in medial R frontal lobe. CXR showed mild patchy bilateral lower lung opacities, suspicious for multifocal pneumonia.  No data recorded Assessment / Plan / Recommendation CHL IP CLINICAL IMPRESSIONS 11/26/2017  Clinical Impression Pt exhibits mild oral and moderate pharyngeal dysphagia with impairments resulting from multifactors including recent stroke (5/19 with 5 pna episodes since), anatomical difference (suspect cervical spine kyphosis), decreased timing of swallow initiation/laryngeal vestibule closure, mildly reduced stripping wave and questionable UES function. Occasional lingual pumping and lingual residue with honey thick liquids as well as significantly prolonged mastication observed with regular texture. Thin and nectar liquids were penetrated to vocal fold level without sensation primarily prior to swallow initiation indicative of decreased timing of swallow mechanisms. Multiple compensatory strategies (chin tuck, breath hold, head turn to left) attempted with thin and nectar, however none were beneficial to aid in more timely/complete airway protection. Although moderate vallecular residue was present with honey thick liquids, only one instance of trace penetration above the vocal folds was noted. Verbal cues for second swallows were mildy effective to reduce residue. Intermittently reduced upper esophageal sphincter (UES) relaxation observed with thin and nectar exacerbated by implementation of chin tuck strategy. Suspected reduced pharyngeal stripping wave resulting in pharyngeal/vallecular residue with difficulty clearing. Recommend dysphagia 2 (minced) diet texture and short term use of honey thick liquids and full supervision to ensure upright positioning, small bites/sips, intermittent cough/throat clear throughout PO intake, and extra dry swallows to aid in clearance of vallecular residue. ST will continue to follow to provide treatment with diet safety, efficiency while pt remains in acute setting and potential to upgrade liquid consistency via exercise, RMT. SLP Visit Diagnosis Dysphagia, oropharyngeal phase (R13.12) Attention and concentration deficit following -- Frontal lobe and executive function  deficit following -- Impact on safety and function Severe aspiration risk   CHL IP TREATMENT RECOMMENDATION 11/26/2017 Treatment Recommendations Therapy as outlined in treatment plan below   Prognosis 11/26/2017 Prognosis for Safe Diet Advancement Fair Barriers to Reach Goals -- Barriers/Prognosis Comment -- CHL IP DIET RECOMMENDATION 11/26/2017 SLP Diet Recommendations Dysphagia 2 (Fine chop) solids;Honey thick liquids Liquid Administration via Cup Medication Administration Crushed with puree Compensations Slow rate;Small sips/bites;Clear throat intermittently;Multiple dry swallows after each bite/sip Postural Changes Seated upright at 90 degrees   CHL IP OTHER RECOMMENDATIONS 11/26/2017 Recommended Consults -- Oral Care Recommendations Oral care BID Other Recommendations --   CHL IP FOLLOW UP RECOMMENDATIONS 11/26/2017 Follow up Recommendations Home health SLP   CHL IP FREQUENCY AND DURATION 11/26/2017 Speech Therapy Frequency (ACUTE ONLY) min 2x/week Treatment Duration 2 weeks      CHL IP ORAL PHASE 11/26/2017 Oral Phase Impaired Oral - Pudding Teaspoon -- Oral - Pudding Cup -- Oral - Honey Teaspoon -- Oral - Honey Cup Lingual/palatal residue Oral - Nectar Teaspoon -- Oral - Nectar Cup WFL Oral - Nectar Straw -- Oral - Thin Teaspoon -- Oral - Thin Cup WFL Oral - Thin Straw -- Oral - Puree Lingual pumping Oral - Mech Soft --  Oral - Regular Lingual pumping;Impaired mastication Oral - Multi-Consistency -- Oral - Pill -- Oral Phase - Comment --  CHL IP PHARYNGEAL PHASE 11/26/2017 Pharyngeal Phase Impaired Pharyngeal- Pudding Teaspoon -- Pharyngeal -- Pharyngeal- Pudding Cup -- Pharyngeal -- Pharyngeal- Honey Teaspoon -- Pharyngeal -- Pharyngeal- Honey Cup Pharyngeal residue - pyriform;Pharyngeal residue - valleculae;Penetration/Aspiration during swallow;Reduced pharyngeal peristalsis Pharyngeal Material enters airway, remains ABOVE vocal cords and not ejected out Pharyngeal- Nectar Teaspoon -- Pharyngeal --  Pharyngeal- Nectar Cup Penetration/Aspiration before swallow;Reduced pharyngeal peristalsis;Pharyngeal residue - valleculae;Pharyngeal residue - pyriform;Compensatory strategies attempted (with notebox) Pharyngeal Material enters airway, CONTACTS cords and not ejected out Pharyngeal- Nectar Straw -- Pharyngeal -- Pharyngeal- Thin Teaspoon -- Pharyngeal -- Pharyngeal- Thin Cup Pharyngeal residue - valleculae;Reduced pharyngeal peristalsis;Penetration/Aspiration before swallow;Penetration/Aspiration during swallow;Compensatory strategies attempted (with notebox) Pharyngeal Material enters airway, CONTACTS cords and not ejected out Pharyngeal- Thin Straw -- Pharyngeal -- Pharyngeal- Puree Pharyngeal residue - valleculae Pharyngeal -- Pharyngeal- Mechanical Soft -- Pharyngeal -- Pharyngeal- Regular Pharyngeal residue - valleculae Pharyngeal -- Pharyngeal- Multi-consistency -- Pharyngeal -- Pharyngeal- Pill -- Pharyngeal -- Pharyngeal Comment --  CHL IP CERVICAL ESOPHAGEAL PHASE 11/26/2017 Cervical Esophageal Phase Impaired Pudding Teaspoon -- Pudding Cup -- Honey Teaspoon -- Honey Cup WFL Nectar Teaspoon -- Nectar Cup Reduced cricopharyngeal relaxation Nectar Straw -- Thin Teaspoon -- Thin Cup Reduced cricopharyngeal relaxation;Other (Comment) Thin Straw -- Puree WFL Mechanical Soft -- Regular WFL Multi-consistency -- Pill -- Cervical Esophageal Comment (No Data) [place] Siren 11/26/2017, 1:39 PM Orbie Pyo Colvin Caroli.Ed Actor Pager [phone] Office [phone]               Time Spent in minutes  30   Lala Lund M.D on 12/17/2017 at 11:45 AM  To page go to [email or web address] - password [facility]

## 2017-12-17 NOTE — Progress Notes (Signed)
Initial Nutrition Assessment  DOCUMENTATION CODES:   Severe malnutrition in context of chronic illness  INTERVENTION:   - Ensure Enlive po BID, each supplement provides 350 kcal and 20 grams of protein  - 30 ml Prostat BID, each supplement provides 100 kcals and 15 grams protein  - MVI with minerals daily  NUTRITION DIAGNOSIS:   Severe Malnutrition related to chronic illness (dysphagia) as evidenced by severe fat depletion, severe muscle depletion, percent weight loss (17.9% weight loss in 9 months).  GOAL:   Patient will meet greater than or equal to 90% of their needs  MONITOR:   PO intake, Supplement acceptance, Skin, Weight trends, Labs, I & O's, Other (GOC)  REASON FOR ASSESSMENT:   Malnutrition Screening Tool    ASSESSMENT:   84 year old male who presented to the ED with possible PNA. PMH significant for COPD, GERD, HTN, multiple prior episodes of PNA, sacral decubitus ulcer, and stroke. Pt diagnosed with aspiration PNA.  Palliative care consult pending. SLP saw pt this AM with recommendations for Dysphagia 2 with thins.  Spoke with pt and wife at bedside. Pt resting in bed with eyes closed, occasionally adding to the conversation with pt's wife.  Pt's wife reports that pt's PO intake decreased after discharge from the hospital last month (November 26, 2017) when he was prescribed to follow a modified texture diet with thickened liquids.  Pt's wife shares that she has still been trying to get pt to eat 3 meals daily but pt does not eat more than a few bites at meals. Breakfast may include bites of applesauce. Lunch or dinner may include bites of pureed mixed vegetables or ground meatloaf. Pt's wife has also provided pt with store-bought baby food, but pt quickly tired of this.  Pt's wife also reports that 3 weeks ago, pt was able to ambulate with his walker but has been bedbound since then. Noted pt with several pressure injuries. Discussed importance of adequate PO  intake with a focus on protein in wound healing and maintaining skin integrity.  Pt willing to try oral nutrition supplement. Pro-stat already ordered per MD. RD to order Ensure Enlive.  Pt's wife confirms that pt has been losing weight and reports his UBW as 176 lbs and that he last weighed this "over a year ago." Per weight history in chart, pt with 15.9 kg weight loss since 03/11/17. This is a 17.9% weight loss in 9 months which is significant for timeframe.  Medications reviewed and include: Pepcid, Pro-stat TID ordered per MD, Protonix, lactated ringers  Labs reviewed: sodium 147 (H), creatinine 1.47 (H)  NUTRITION - FOCUSED PHYSICAL EXAM:    Most Recent Value  Orbital Region  Severe depletion  Upper Arm Region  Severe depletion  Thoracic and Lumbar Region  Moderate depletion  Buccal Region  Severe depletion  Temple Region  Severe depletion  Clavicle Bone Region  Severe depletion  Clavicle and Acromion Bone Region  Severe depletion  Scapular Bone Region  Severe depletion  Dorsal Hand  Moderate depletion  Patellar Region  Severe depletion  Anterior Thigh Region  Severe depletion  Edema (RD Assessment)  None  Hair  Reviewed  Eyes  Reviewed  Mouth  Reviewed  Skin  Reviewed  Nails  Reviewed       Diet Order:   Diet Order            DIET DYS 2 Room service appropriate? Yes; Fluid consistency: Thin  Diet effective now  EDUCATION NEEDS:   Education needs have been addressed  Skin:  Skin Assessment: Skin Integrity Issues: DTI: left toe, several to left foot, several to right foot Stage II: sacrum  Last BM:  12/10 (small type 4)  Height:   Ht Readings from Last 1 Encounters:  12/16/17 6\' 3"  (1.905 m)    Weight:   Wt Readings from Last 1 Encounters:  12/16/17 73 kg    Ideal Body Weight:  89.1 kg  BMI:  Body mass index is 20.12 kg/m.  Estimated Nutritional Needs:   Kcal:  1900-2100  Protein:  95-110 grams  Fluid:  >/= 1.9 L    Gaynell Face, MS, RD, LDN Inpatient Clinical Dietitian Pager: [phone] Weekend/After Hours: [phone]

## 2017-12-17 NOTE — Telephone Encounter (Signed)
Angel notified. 

## 2017-12-17 NOTE — Consult Note (Signed)
[place] Nurse wound consult note Patient receiving care in [facility] [phone].  No family present at the time of my assessment. Reason for Consult: Multiple wounds to bilateral feet and wound on buttocks Wound type: All foot wounds are maroon in color and represent DTPIs.  The wound areas include both lateral malleoli, both mid foot bony prominences, bilateral great toe tips, the left lateral heel, and the inner tip of right second toe where the great toe presses against it.  The plan of care for all of these areas is to apply betadine to all discolored areas on bilateral feet/heels/ankles/toes.  Allow to air dry. The right buttock has one open wound area that measures 1.2 cm x 0.3 cm with a yellow wound base.  This area is surrounded by linear maroon colored marks that represent DTPIs.  The plan of care for this area is to cleanse right upper buttock wound with cleanser.  Pat dry.  Prep area around the wound with skin prep pad. Allow to air dry. Apply a thin hydrocolloid Kellie Simmering 652).  Date the dressing.  It can remain in place up to 5 days. Foot wound measurements: Left lateral malleolus: 1.1 cm x 2.1 cm Right lateral malleolus:  2 cm x 2.2 cm Left lateral heel: 2.2 cm x 3.2 cm Left midfoot: 1 cm x 1 cm Right midfoot: 1 cm x 1.1 cm Left great toe tip: 0.6 cm x 1.3 cm Right great toe tip has a dry callus on it. Additional plan of care items:  Prevalon boots, floating heels, turning and repositioning.  Monitor the wound area(s) for worsening of condition such as: Signs/symptoms of infection,  Increase in size,  Development of or worsening of odor, Development of pain, or increased pain at the affected locations.  Notify the medical team if any of these develop.  Thank you for the consult.  Discussed plan of care with the patient and bedside nurse.  [place] nurse will not follow at this time.  Please re-consult the [place] team if needed.  Val Riles, RN, MSN, CWOCN, CNS-BC, pager [phone]

## 2017-12-17 NOTE — Evaluation (Signed)
Physical Therapy Evaluation Patient Details Name: Chad [name] MRN: [id number] DOB: 1931/09/08 Today's Date: 12/17/2017   History of Present Illness  84 year old male who presented to the ED with possible PNA. PMH significant for COPD, GERD, HTN, multiple prior episodes of PNA, sacral decubitus ulcer, and stroke. Pt diagnosed with aspiration PNA.  Clinical Impression  Pt admitted with above diagnosis. Pt currently with functional limitations due to the deficits listed below (see PT Problem List). Pt needing mod A for getting to EOB as well as sit to stand and SPT. Not sufficient energy to ambulate today but pt unable to stand prior to coming in from home so is somewhat improved today. O2 sats remained 97% on 2L O2. Recommend return home and resuming HHPT.  Pt will benefit from skilled PT to increase their independence and safety with mobility to allow discharge to the venue listed below.       Follow Up Recommendations Home health PT;Supervision/Assistance - 24 hour    Equipment Recommendations  None recommended by PT    Recommendations for Other Services OT consult     Precautions / Restrictions Precautions Precautions: Fall Restrictions Weight Bearing Restrictions: No      Mobility  Bed Mobility Overal bed mobility: Needs Assistance Bed Mobility: Supine to Sit;Sit to Supine     Supine to sit: Mod assist Sit to supine: Mod assist   General bed mobility comments: mod A for LE's off and back on bed and for trunk elevation to sitting. Pt with initial posterior lean but improved with feet on floor  Transfers Overall transfer level: Needs assistance Equipment used: 2 person hand held assist Transfers: Sit to/from [facility] Sit to Stand: Mod assist;+2 physical assistance Stand pivot transfers: Mod assist;+2 physical assistance          Ambulation/Gait             General Gait Details: unable today  Stairs            Wheelchair  Mobility    Modified Rankin (Stroke Patients Only)       Balance Overall balance assessment: Needs assistance Sitting-balance support: Bilateral upper extremity supported;Feet supported Sitting balance-Leahy Scale: Poor Sitting balance - Comments: needed min A to maintain sitting and LOB with LE motion Postural control: Posterior lean Standing balance support: Bilateral upper extremity supported Standing balance-Leahy Scale: Poor Standing balance comment: needs UE support and external support as well                             Pertinent Vitals/Pain Pain Assessment: Faces Pain Score: 2  Faces Pain Scale: Hurts little more Pain Location: feet Pain Intervention(s): Monitored during session    Home Living Family/patient expects to be discharged to:: Private residence Living Arrangements: Spouse/significant other;Other relatives Available Help at Discharge: Family;Friend(s);Available 24 hours/day Type of Home: House Home Access: Ramped entrance     Home Layout: One level Home Equipment: Shower seat - built in;Cane - single point;Grab bars - toilet;Walker - 2 wheels;Other (comment);Wheelchair - Horticulturist, commercial, [employer]) Additional Comments: ambualtes with RW    Prior Function Level of Independence: Needs assistance   Gait / Transfers Assistance Needed: RW at home, but pt had gotten so he could not stand  ADL's / Homemaking Assistance Needed: wife assists with bathing        Hand Dominance   Dominant Hand: Right    Extremity/Trunk Assessment   Upper Extremity Assessment Upper Extremity  Assessment: Generalized weakness    Lower Extremity Assessment Lower Extremity Assessment: Generalized weakness    Cervical / Trunk Assessment Cervical / Trunk Assessment: Kyphotic  Communication   Communication: HOH  Cognition Arousal/Alertness: Awake/alert Behavior During Therapy: WFL for tasks assessed/performed Overall Cognitive Status: Within Functional  Limits for tasks assessed                                 General Comments: HOH, often lets wife answer questions for him, very modest with self care      General Comments General comments (skin integrity, edema, etc.): O2 sats 97% on RA. pt had BM when standing twice, transferred to [facility] to go more but unable at that point.     Exercises     Assessment/Plan    PT Assessment Patient needs continued PT services  PT Problem List Decreased strength;Decreased activity tolerance;Decreased balance;Decreased mobility;Decreased knowledge of use of DME;Decreased knowledge of precautions;Pain;Decreased skin integrity;Cardiopulmonary status limiting activity       PT Treatment Interventions Gait training;DME instruction;Functional mobility training;Therapeutic activities;Therapeutic exercise    PT Goals (Current goals can be found in the Care Plan section)  Acute Rehab PT Goals Patient Stated Goal: go home PT Goal Formulation: With patient/family Time For Goal Achievement: 12/31/17 Potential to Achieve Goals: Fair    Frequency Min 3X/week   Barriers to discharge        Co-evaluation               AM-PAC PT "6 Clicks" Mobility  Outcome Measure Help needed turning from your back to your side while in a flat bed without using bedrails?: A Lot Help needed moving from lying on your back to sitting on the side of a flat bed without using bedrails?: A Lot Help needed moving to and from a bed to a chair (including a wheelchair)?: A Lot Help needed standing up from a chair using your arms (e.g., wheelchair or bedside chair)?: A Lot Help needed to walk in hospital room?: Total Help needed climbing 3-5 steps with a railing? : Total 6 Click Score: 10    End of Session Equipment Utilized During Treatment: Oxygen Activity Tolerance: Patient limited by fatigue Patient left: in bed;with call bell/phone within reach;with bed alarm set;with family/visitor present Nurse  Communication: Mobility status PT Visit Diagnosis: Unsteadiness on feet (R26.81);Muscle weakness (generalized) (M62.81);Difficulty in walking, not elsewhere classified (R26.2);Pain Pain - part of body: Ankle and joints of foot    Time: 8832-5498 PT Time Calculation (min) (ACUTE ONLY): 36 min   Charges:   PT Evaluation $PT Eval Moderate Complexity: 1 Mod PT Treatments $Therapeutic Activity: 8-22 mins        Leighton Roach, PT  Acute Rehab Services  Pager [phone] Office [place] 12/17/2017, 5:03 PM

## 2017-12-17 NOTE — Evaluation (Signed)
Clinical/Bedside Swallow Evaluation Patient Details  Name: Chad [name] MRN: [id number] Date of Birth: 1931-01-26  Today's Date: 12/17/2017 Time: SLP Start Time (ACUTE ONLY): 1015 SLP Stop Time (ACUTE ONLY): 1115 SLP Time Calculation (min) (ACUTE ONLY): 60 min  Past Medical History:  Past Medical History:  Diagnosis Date  . Arthritis    "hands" (01/12/2013); feet (podiatry consultation); "back" (06/12/2017)  . Atrial flutter ([place])    a. Slow ~100bpm when in 2:1; dx 04/2012. b. Placed on Xarelto.  c. s/p ablation 07-08-2012 by Dr Rayann Heman  . Carotid artery calcification    By CXR - dopplers 05/06/12 without obvious evidence of significant ICA stenosis >40%  . Colon polyps 06/26/2005  . COPD (chronic obstructive pulmonary disease) ([place])   . Diverticulosis of colon (without mention of hemorrhage)   . Gastritis, chronic    Pt denies bleeding 04/2012. EGD 2012 reportedly normal.  . GERD (gastroesophageal reflux disease)   . Hypertension   . Hyponatremia    04/2012 r/t diuretic.  . IBS (irritable bowel syndrome)   . Internal and external hemorrhoids without complication   . Macular degeneration of left eye   . Pneumonia    "@ least 5 times" (06/12/2017)  . Stroke ([place])   . Tongue cancer ([place]) 02/1997   Past Surgical History:  Past Surgical History:  Procedure Laterality Date  . ATRIAL FLUTTER ABLATION N/A 07/08/2012   Procedure: ATRIAL FLUTTER ABLATION;  Surgeon: Thompson Grayer, MD;  [facility] CATH LAB;  Service: Cardiovascular;  Laterality: N/A;  . CARDIAC CATHETERIZATION  04/2012  . CATARACT EXTRACTION W/ INTRAOCULAR LENS IMPLANT Left   . COLONOSCOPY  08/29/10   diverticulosis, internal hemorrhoids  . ESOPHAGOGASTRODUODENOSCOPY  09/20/10   normal  . [place]  . LEFT HEART CATHETERIZATION WITH CORONARY ANGIOGRAM N/A 05/06/2012   Procedure: LEFT HEART CATHETERIZATION WITH CORONARY ANGIOGRAM;  Surgeon: Peter M Martinique, MD;  Location: [facility] CATH LAB;  Service: Cardiovascular;   Laterality: N/A;  . 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 PACEMAKER IMPLANT N/A 08/01/2017   Procedure: PACEMAKER IMPLANT;  Surgeon: Constance Haw, MD;  Location: [facility] CV LAB;  Service: Cardiovascular;  Laterality: N/A;  . TRIGGER FINGER RELEASE Right 1970's   "2 fingers"  . WART FULGURATION Left 09/15/2015   Procedure: EXCISIONal biospy of left peri anual and anual canal mass;  Surgeon: Greer Pickerel, MD;  Location: [facility] ORS;  Service: General;  Laterality: Left;   HPI:   Chad [name]  is a 84 y.o. male, with history of paroxysmal atrial fibrillation [place] vas 2 score of at least 5 including history of CVA/TIA on Eliquis, CAD, aspiration pneumonia recently admitted for it, failure to thrive, severe protein calorie malnutrition, COPD, sacral decubitus ulcer stage unknown who was recently admitted to the hospital for acute hypoxic respiratory failure due to aspiration pneumonia and was discharged home about 3 weeks ago comes back brought in by family with chief complaints of cough, fever, decreased oral intake and decreased mental status.   Assessment / Plan / Recommendation Clinical Impression  Pt known to ST services, having had MBS in November 2019. Pt's wife reports she has continued the recommended diet (soft solids, honey thick liquids), however, pt intake has been poor as he does not like the thickened liquids and will not drink them. Bedside presentation today is similar in fashion to previous assessment, with tolerance of all consistencies tested. Pt has had multiple hospitalizations with recurrent pneumonia and high aspiration  risk, and Palliative Care has been consulted at this time. Following lengthy discussion with pt and wife regarding recent MBS results, aspiration risk, lack of significant change anticipated in swallow function and safety, and quality of life, as well as discussion with MD and RN, will begin dys 2 diet (dentures are not present at this time) and  thin liquids, understanding high aspiration risk. Palliative Care consult is pending.   Wife verbalized understanding of Palliative Care consult and dysphagia 2 diet with thin liquids via cup sip, with known aspiration risk. Safe swallow precautions posted at [facility] and reviewed with pt/wife. ST will follow briefly for education.   SLP Visit Diagnosis: Dysphagia, unspecified (R13.10)    Aspiration Risk  Severe aspiration risk;Risk for inadequate nutrition/hydration    Diet Recommendation Dysphagia 2 (Fine chop);Thin liquid   Liquid Administration via: Cup Medication Administration: Whole meds with puree Supervision: Patient able to self feed;Staff to assist with self feeding;Intermittent supervision to cue for compensatory strategies Compensations: Slow rate;Small sips/bites;Clear throat intermittently;Multiple dry swallows after each bite/sip;Minimize environmental distractions Postural Changes: Seated upright at 90 degrees    Other  Recommendations Oral Care Recommendations: Oral care BID   Follow up Recommendations None      Frequency and Duration min 1 x/week  1 week;2 weeks       Prognosis Prognosis for Safe Diet Advancement: Fair      Swallow Study   General Date of Onset: 12/16/17 HPI:  Chad [name]  is a 84 y.o. male, with history of paroxysmal atrial fibrillation [place] vas 2 score of at least 5 including history of CVA/TIA on Eliquis, CAD, aspiration pneumonia recently admitted for it, failure to thrive, severe protein calorie malnutrition, COPD, sacral decubitus ulcer stage unknown who was recently admitted to the hospital for acute hypoxic respiratory failure due to aspiration pneumonia and was discharged home about 3 weeks ago comes back brought in by family with chief complaints of cough, fever, decreased oral intake and decreased mental status. Type of Study: Bedside Swallow Evaluation Previous Swallow Assessment: BSE and MBS 11/26/17 - Dys2/honey thick liquids Diet  Prior to this Study: NPO Temperature Spikes Noted: No Respiratory Status: Nasal cannula History of Recent Intubation: No Behavior/Cognition: Alert;Cooperative;Pleasant mood Oral Cavity Assessment: Within Functional Limits Oral Care Completed by SLP: No Oral Cavity - Dentition: Edentulous;Dentures, not available Vision: Functional for self-feeding Self-Feeding Abilities: Able to feed self;Needs assist;Needs set up Patient Positioning: Upright in bed Baseline Vocal Quality: Normal Volitional Cough: Strong Volitional Swallow: Able to elicit    Oral/Motor/Sensory Function Overall Oral Motor/Sensory Function: Within functional limits   Ice Chips Ice chips: Within functional limits Presentation: Spoon   Thin Liquid Thin Liquid: Within functional limits Presentation: Cup    Nectar Thick Nectar Thick Liquid: Not tested   Honey Thick Honey Thick Liquid: Not tested   Puree Puree: Within functional limits Presentation: Spoon;Self Fed   Solid     Solid: Within functional limits Presentation: [place]. Quentin Ore, [facility], CCC-SLP Speech Language Pathologist [phone]  Shonna Chock 12/17/2017,11:42 AM

## 2017-12-17 NOTE — Consult Note (Signed)
   [facility] CM Inpatient Consult   12/17/2017  SHAHRUKH PASCH 1931/06/16 [id number]  Patient assessed for readmission less than 30 days and extreme high risk score [41%] for unplanned readmission.  Patient is currently active with [facility] for chronic disease management services.  Patient has been engaged by a [facility].    Palliative consult in progress noted. Will follow patient for progress and disposition needs.  Spoke with  Inpatient Case Manager aware that [facility] following. Of note, [facility] Care Management services does not replace or interfere with any services that are needed or arranged by inpatient case management or social work.  For additional questions or referrals please contact:  Natividad Brood, RN BSN [place] [facility] Liaison  [phone] business mobile phone Toll free office [phone]

## 2017-12-17 NOTE — Discharge Instructions (Addendum)
Follow with Primary MD Plotnikov, Evie Lacks, MD in 7 days   Get CBC, CMP, 2 view Chest X ray -  checked  by Primary MD in 5-7 days    Activity: As tolerated with Full fall precautions use walker/cane & assistance as needed  Disposition Home    Diet: Dysphagia 2 diet with feeding assistance and aspiration precautions.  Special Instructions: If you have smoked or chewed Tobacco  in the last 2 yrs please stop smoking, stop any regular Alcohol  and or any Recreational drug use.  On your next visit with your primary care physician please Get Medicines reviewed and adjusted.  Please request your Prim.MD to go over all Hospital Tests and Procedure/Radiological results at the follow up, please get all Hospital records sent to your Prim MD by signing hospital release before you go home.  If you experience worsening of your admission symptoms, develop shortness of breath, life threatening emergency, suicidal or homicidal thoughts you must seek medical attention immediately by calling 911 or calling your MD immediately  if symptoms less severe.  You Must read complete instructions/literature along with all the possible adverse reactions/side effects for all the Medicines you take and that have been prescribed to you. Take any new Medicines after you have completely understood and accpet all the possible adverse reactions/side effects.        Information on my medicine - ELIQUIS (apixaban)  Why was Eliquis prescribed for you? Eliquis was prescribed for you to reduce the risk of a blood clot forming that can cause a stroke if you have a medical condition called atrial fibrillation (a type of irregular heartbeat).  What do You need to know about Eliquis ? Take your Eliquis TWICE DAILY - one tablet in the morning and one tablet in the evening with or without food. If you have difficulty swallowing the tablet whole please discuss with your pharmacist how to take the medication safely.  Take  Eliquis exactly as prescribed by your doctor and DO NOT stop taking Eliquis without talking to the doctor who prescribed the medication.  Stopping may increase your risk of developing a stroke.  Refill your prescription before you run out.  After discharge, you should have regular check-up appointments with your healthcare provider that is prescribing your Eliquis.  In the future your dose may need to be changed if your kidney function or weight changes by a significant amount or as you get older.  What do you do if you miss a dose? If you miss a dose, take it as soon as you remember on the same day and resume taking twice daily.  Do not take more than one dose of ELIQUIS at the same time to make up a missed dose.  Important Safety Information A possible side effect of Eliquis is bleeding. You should call your healthcare provider right away if you experience any of the following: ? Bleeding from an injury or your nose that does not stop. ? Unusual colored urine (red or dark brown) or unusual colored stools (red or black). ? Unusual bruising for unknown reasons. ? A serious fall or if you hit your head (even if there is no bleeding).  Some medicines may interact with Eliquis and might increase your risk of bleeding or clotting while on Eliquis. To help avoid this, consult your healthcare provider or pharmacist prior to using any new prescription or non-prescription medications, including herbals, vitamins, non-steroidal anti-inflammatory drugs (NSAIDs) and supplements.  This website has more information  on Eliquis (apixaban): http://www.eliquis.com/eliquis/home

## 2017-12-18 LAB — C DIFFICILE QUICK SCREEN W PCR REFLEX
C Diff antigen: NEGATIVE
C Diff interpretation: NOT DETECTED
C Diff toxin: NEGATIVE

## 2017-12-18 MED ORDER — LOPERAMIDE HCL 2 MG PO CAPS
4.0000 mg | ORAL_CAPSULE | Freq: Once | ORAL | Status: AC
  Administered 2017-12-18: 4 mg via ORAL
  Filled 2017-12-18: qty 2

## 2017-12-18 NOTE — Care Management Note (Signed)
**Note Chad-Identified via Obfuscation** Case Management Note  Patient Details  Name: Chad [name] MRN: [id number] Date of Birth: 06-24-1931  Subjective/Objective:     PNA. Hx of  CVA/TIA/ Eliquis, CAD, aspiration pneumonia , failure to thrive, severe protein calorie malnutrition, COPD, sacral decubitus ulcer. Resides with wife, [name]( ex-wife) whom he recently remarried. Mortimer Fries (son)    Moishe Schellenberg. (Son) Lillian Tigges [phone]      Action/Plan: NCM spoke with son and confirmed disposition is home with hospice care. Choice given. [facility] selected. Referral made with Jennifer/ [facility] for home hospice care.  Expected Discharge Date:                  Expected Discharge Plan:  Home w Hospice Care  In-House Referral:     Discharge planning Services  CM Consult  Post Acute Care Choice:    Choice offered to:  Patient, Adult Children  DME Arranged:    DME Agency:     HH Arranged:    [place] Agency:     Status of Service:  In process, will continue to follow  If discussed at Long Length of Stay Meetings, dates discussed:    Additional Comments:  Sharin Mons, RN 12/18/2017, 1:18 PM

## 2017-12-18 NOTE — Progress Notes (Addendum)
PROGRESS NOTE                                                                                                                                                                                                             Patient Demographics:    Chad [name], is a 84 y.o. male, DOB - [birth year], CWC:[id number]  Admit date - 12/16/2017   Admitting Physician Thurnell Lose, MD  Outpatient Primary MD for the patient is Plotnikov, Evie Lacks, MD  LOS - 2  Chief Complaint  Patient presents with  . possible pneumonia       Brief Narrative   Chad [name]  is a 84 y.o. male, with history of paroxysmal atrial fibrillation Mali vas 2 score of at least 5 including history of CVA/TIA on Eliquis, CAD, aspiration pneumonia recently admitted for it, failure to thrive, severe protein calorie malnutrition, COPD, sacral decubitus ulcer stage unknown who was recently admitted to the hospital for acute hypoxic respiratory failure due to aspiration pneumonia and was discharged home about 3 weeks ago comes back brought in by family with chief complaints of cough, fever, decreased oral intake and decreased mental status.  Patient currently is somnolent history obtained by son who is bedside, according to the son for the last 7 to 8 days patient has been gradually developing a cough, low-grade fevers, has not been eating or drinking well, since this morning he had been confused at home and was hence brought to the ER where he was diagnosed with aspiration pneumonia and I was called to admit.  Son denies any known event of aspiration, he agrees that patient has been gradually declining and now has been wheelchair and bedbound for the last month.    Subjective:    Patient in bed, appears comfortable, denies any headache, no fever, no chest pain or pressure, no shortness of breath , no abdominal pain. No focal weakness.     Assessment  & Plan :     1.  Acute hypoxic respiratory failure due to  recurrent aspiration pneumonia.  Patient appears extremely frail and deconditioned, this is his second episode of aspiration pneumonia in 3 weeks, I think he is now in his terminal decline.  plan was discussed with patient, son and wife, gentle medical Rx, comfort feeds, open to Hospice, for now he is improved with Unasyn and gentle hydration, will plan on discharging tomorrow either with home hospice or home health PT and social work.  2.  Paroxysmal atrial fibrillation  with Mali vas 2 score of at least 4 with history of TIA/CVA.  Currently not on rate controlling agents, continue Eliquis per pharmacy.  3.  GERD.  On PPI.  4.  Dyslipidemia.  On statin continue.  5.  HX of CVA/TIA.  Continue Eliquis and statin for secondary prevention.  6.  History of CAD.  No acute issues.  Supportive care.    7. Failure to thrive with severe protein calorie malnutrition. On pro-stat, palliative care meeting soon for long-term goals of care, family reasonable.  8.  Urinary colonization.  Responded empirically to Unasyn, infection likely was pneumonia and not UTI.  Monitor clinically.  9. ARF on CKD 4 - resolved after IVF, creat of 1.4  Is the baseline.     Family Communication  : son  Code Status :  DNR  Disposition Plan  :  TBD  Consults  :  Pall Care  Procedures  :      DVT Prophylaxis  :  Eliquis  Lab Results  Component Value Date   PLT 280 12/17/2017    Diet :  Diet Order            DIET DYS 2 Room service appropriate? Yes; Fluid consistency: Thin  Diet effective now               Inpatient Medications Scheduled Meds: . apixaban  2.5 mg Oral BID  . atorvastatin  20 mg Oral q1800  . escitalopram  10 mg Oral BH-q7a  . famotidine  20 mg Oral QHS  . feeding supplement (ENSURE ENLIVE)  237 mL Oral TID WC  . feeding supplement (PRO-STAT SUGAR FREE 64)  30 mL Oral BID  . multivitamin with minerals  1 tablet Oral Daily  . pantoprazole  40 mg Oral Daily  . sodium chloride  flush  3 mL Intravenous Q12H   Continuous Infusions: . ampicillin-sulbactam (UNASYN) IV 3 g (12/18/17 0423)   PRN Meds:.acetaminophen **OR** acetaminophen, bisacodyl, fluticasone, metoprolol tartrate, naphazoline-glycerin, polyethylene glycol powder  Antibiotics  :   Anti-infectives (From admission, onward)   Start     Dose/Rate Route Frequency Ordered Stop   12/17/17 1200  Ampicillin-Sulbactam (UNASYN) 3 g in sodium chloride 0.9 % 100 mL IVPB     3 g 200 mL/hr over 30 Minutes Intravenous Every 8 hours 12/17/17 1032     12/17/17 0100  Ampicillin-Sulbactam (UNASYN) 3 g in sodium chloride 0.9 % 100 mL IVPB  Status:  Discontinued     3 g 200 mL/hr over 30 Minutes Intravenous Every 12 hours 12/16/17 1804 12/17/17 1032   12/16/17 1800  vancomycin (VANCOCIN) 1,500 mg in sodium chloride 0.9 % 500 mL IVPB  Status:  Discontinued     1,500 mg 250 mL/hr over 120 Minutes Intravenous  Once 12/16/17 1728 12/16/17 1743   12/16/17 1715  cefTRIAXone (ROCEPHIN) 2 g in sodium chloride 0.9 % 100 mL IVPB  Status:  Discontinued     2 g 200 mL/hr over 30 Minutes Intravenous Every 24 hours 12/16/17 1709 12/16/17 1710   12/16/17 1715  azithromycin (ZITHROMAX) 500 mg in sodium chloride 0.9 % 250 mL IVPB  Status:  Discontinued     500 mg 250 mL/hr over 60 Minutes Intravenous Every 24 hours 12/16/17 1709 12/16/17 1710   12/16/17 1715  vancomycin (VANCOCIN) IVPB 1000 mg/200 mL premix  Status:  Discontinued     1,000 mg 200 mL/hr over 60 Minutes Intravenous  Once 12/16/17 1711 12/16/17 1728   12/16/17  1715  ceFEPIme (MAXIPIME) 2 g in sodium chloride 0.9 % 100 mL IVPB  Status:  Discontinued     2 g 200 mL/hr over 30 Minutes Intravenous  Once 12/16/17 1711 12/16/17 1814   12/16/17 1715  azithromycin (ZITHROMAX) 500 mg in sodium chloride 0.9 % 250 mL IVPB  Status:  Discontinued     500 mg 250 mL/hr over 60 Minutes Intravenous Every 24 hours 12/16/17 1711 12/17/17 1030          Objective:   Vitals:    12/17/17 0428 12/17/17 1336 12/17/17 2114 12/18/17 0605  BP: (!) 165/79 114/71 (!) 93/48 (!) 153/69  Pulse: 89 87 76 75  Resp: 18 18 17 20   Temp: 97.6 F (36.4 C) 97.9 F (36.6 C) 98.1 F (36.7 C) 97.7 F (36.5 C)  TempSrc:   Oral Oral  SpO2: 96% 94% 97% 99%  Weight:      Height:        Wt Readings from Last 3 Encounters:  12/16/17 73 kg  12/03/17 76.2 kg  11/26/17 74.7 kg     Intake/Output Summary (Last 24 hours) at 12/18/2017 1038 Last data filed at 12/18/2017 0900 Gross per 24 hour  Intake 1603.99 ml  Output 450 ml  Net 1153.99 ml     Physical Exam  Awake Alert, No new F.N deficits, Normal affect [place].AT,PERRAL Supple Neck,No JVD, No cervical lymphadenopathy appriciated.  Symmetrical Chest wall movement, Good air movement bilaterally, CTAB RRR,No Gallops, Rubs or new Murmurs, No Parasternal Heave +ve B.Sounds, Abd Soft, No tenderness, No organomegaly appriciated, No rebound - guarding or rigidity. No Cyanosis, Clubbing or edema, No new Rash or bruise     Data Review:    CBC Recent Labs  Lab 12/16/17 1351 12/17/17 0532  WBC 13.9* 10.1  HGB 9.4* 10.0*  HCT 31.7* 32.4*  PLT 324 280  MCV 90.8 89.5  MCH 26.9 27.6  MCHC 29.7* 30.9  RDW 14.8 14.7  LYMPHSABS 1.1  --   MONOABS 1.1*  --   EOSABS 0.0  --   BASOSABS 0.0  --     Chemistries  Recent Labs  Lab 12/16/17 1351 12/17/17 0532  NA 143 147*  K 3.9 3.5  CL 103 109  CO2 29 27  GLUCOSE 97 72  BUN 22 20  CREATININE 1.71* 1.47*  CALCIUM 8.1* 8.0*  AST 37  --   ALT 25  --   ALKPHOS 79  --   BILITOT 0.7  --    ------------------------------------------------------------------------------------------------------------------ No results for input(s): CHOL, HDL, LDLCALC, TRIG, CHOLHDL, LDLDIRECT in the last 72 hours.  Lab Results  Component Value Date   HGBA1C 5.6 06/04/2017    ------------------------------------------------------------------------------------------------------------------ Recent Labs    12/16/17 1847  TSH 3.619   ------------------------------------------------------------------------------------------------------------------ No results for input(s): VITAMINB12, FOLATE, FERRITIN, TIBC, IRON, RETICCTPCT in the last 72 hours.  Coagulation profile No results for input(s): INR, PROTIME in the last 168 hours.  No results for input(s): DDIMER in the last 72 hours.  Cardiac Enzymes No results for input(s): CKMB, TROPONINI, MYOGLOBIN in the last 168 hours.  Invalid input(s): CK ------------------------------------------------------------------------------------------------------------------    Component Value Date/Time   BNP 352.9 (H) 11/24/2017 1910    Micro Results Recent Results (from the past 240 hour(s))  Blood Culture (routine x 2)     Status: None (Preliminary result)   Collection Time: 12/16/17  1:29 PM  Result Value Ref Range Status   Specimen Description BLOOD RIGHT ANTECUBITAL  Final   Special  Requests   Final    BOTTLES DRAWN AEROBIC AND ANAEROBIC Blood Culture adequate volume   Culture   Final    NO GROWTH 1 DAY Performed at [facility], [place] [address] [place], [place] [address]    Report Status PENDING  Incomplete  Blood Culture (routine x 2)     Status: None (Preliminary result)   Collection Time: 12/16/17  2:21 PM  Result Value Ref Range Status   Specimen Description BLOOD LEFT ANTECUBITAL  Final   Special Requests   Final    BOTTLES DRAWN AEROBIC AND ANAEROBIC Blood Culture adequate volume   Culture   Final    NO GROWTH 1 DAY Performed at [facility], [place] [address], [place], [place] [address]    Report Status PENDING  Incomplete  Urine culture     Status: Abnormal (Preliminary result)   Collection Time: 12/17/17  7:08 AM  Result Value Ref Range Status   Specimen Description URINE, RANDOM   Final   Special Requests   Final    NONE Performed at [facility], [place] [address] [place], [place] [address]    Culture (A)  Final    >=100,000 COLONIES/mL ENTEROCOCCUS FAECALIS 50,000 COLONIES/mL ENTEROBACTER AEROGENES    Report Status PENDING  Incomplete  C difficile quick scan w PCR reflex     Status: None   Collection Time: 12/18/17 12:59 AM  Result Value Ref Range Status   C Diff antigen NEGATIVE NEGATIVE Final   C Diff toxin NEGATIVE NEGATIVE Final   C Diff interpretation No C. difficile detected.  Final    Comment: Performed at [facility], [place] [address], [place], [place] [address]    Radiology Reports Dg Chest 2 View  Result Date: 12/16/2017 CLINICAL DATA:  Shortness of breath EXAM: CHEST - 2 VIEW COMPARISON:  11/24/2017, 08/14/2017 FINDINGS: Left-sided pacing device as before. Worsening bilateral lung base consolidation posteriorly. No pleural effusion. Mild cardiomegaly with aortic atherosclerosis. No pneumothorax. IMPRESSION: 1. Worsening bilateral lung base consolidations, possible pneumonia; imaging follow-up to resolution recommended to exclude underlying mass lesion given somewhat irregular appearance of the consolidation on the lateral view. Probable small left effusion. 2. Mild cardiomegaly. Electronically Signed   By: Donavan Foil M.D.   On: 12/16/2017 16:57   Ct Head Wo Contrast  Result Date: 11/24/2017 CLINICAL DATA:  Altered mental status, fever, generalized weakness EXAM: CT HEAD WITHOUT CONTRAST TECHNIQUE: Contiguous axial images were obtained from the base of the skull through the vertex without intravenous contrast. COMPARISON:  08/01/2016 FINDINGS: Brain: No evidence of acute infarction, hemorrhage, hydrocephalus, extra-axial collection or mass lesion/mass effect. Encephalomalacic changes in the medial right frontal lobe. Subcortical white matter and periventricular small vessel ischemic changes. Vascular: Intracranial atherosclerosis. Skull:  Normal. Negative for fracture or focal lesion. Sinuses/Orbits: The visualized paranasal sinuses are essentially clear. The mastoid air cells are unopacified. Other: None. IMPRESSION: No evidence of acute intracranial abnormality. Encephalomalacic changes in the medial right frontal lobe. Small vessel ischemic changes. Electronically Signed   By: Julian Hy M.D.   On: 11/24/2017 15:42   Dg Chest Port 1 View  Result Date: 11/24/2017 CLINICAL DATA:  Altered level of consciousness EXAM: PORTABLE CHEST 1 VIEW COMPARISON:  08/14/2017 FINDINGS: Mild patchy bilateral lower lung opacities, suspicious for multifocal pneumonia, possibly on the basis of aspiration. No pleural effusion or pneumothorax. Biapical pleural-parenchymal scarring. The heart is normal in size. Left subclavian pacemaker. IMPRESSION: Mild patchy bilateral lower lung  opacities, suspicious for multifocal pneumonia, possibly on the basis of aspiration. Electronically Signed   By: Julian Hy M.D.   On: 11/24/2017 15:39   Dg Swallowing Func-speech Pathology  Result Date: 11/26/2017 Objective Swallowing Evaluation: Type of Study: MBS-Modified Barium Swallow Study  Patient Details Name: JEISON DELPILAR MRN: [id number] Date of Birth: [birth year] Today's Date: 11/26/2017 Time: SLP Start Time (ACUTE ONLY): 1007 -SLP Stop Time (ACUTE ONLY): 5374 SLP Time Calculation (min) (ACUTE ONLY): 26 min Past Medical History: Past Medical History: Diagnosis Date . Arthritis   "hands" (01/12/2013); feet (podiatry consultation); "back" (06/12/2017) . Atrial flutter ([place])   a. Slow ~100bpm when in 2:1; dx 04/2012. b. Placed on Xarelto.  c. s/p ablation 07-08-2012 by Dr Rayann Heman . Carotid artery calcification   By CXR - dopplers 05/06/12 without obvious evidence of significant ICA stenosis >40% . Colon polyps 06/26/2005 . COPD (chronic obstructive pulmonary disease) ([place])  . Diverticulosis of colon (without mention of hemorrhage)  . Gastritis, chronic   Pt denies  bleeding 04/2012. EGD 2012 reportedly normal. . GERD (gastroesophageal reflux disease)  . Hypertension  . Hyponatremia   04/2012 r/t diuretic. . IBS (irritable bowel syndrome)  . Internal and external hemorrhoids without complication  . Macular degeneration of left eye  . Pneumonia   "@ least 5 times" (06/12/2017) . Stroke ([place])  . Tongue cancer ([place]) 02/1997 Past Surgical History: Past Surgical History: Procedure Laterality Date . ATRIAL FLUTTER ABLATION N/A 07/08/2012  Procedure: ATRIAL FLUTTER ABLATION;  Surgeon: Thompson Grayer, MD;  Location: [facility] CATH LAB;  Service: Cardiovascular;  Laterality: N/A; . CARDIAC CATHETERIZATION  04/2012 . CATARACT EXTRACTION W/ INTRAOCULAR LENS IMPLANT Left  . COLONOSCOPY  08/29/10  diverticulosis, internal hemorrhoids . ESOPHAGOGASTRODUODENOSCOPY  09/20/10  normal . [place] . LEFT HEART CATHETERIZATION WITH CORONARY ANGIOGRAM N/A 05/06/2012  Procedure: LEFT HEART CATHETERIZATION WITH CORONARY ANGIOGRAM;  Surgeon: Peter M Martinique, MD;  Location: [facility] C[facility] LAB;  Service: Cardiovascular;  Laterality: N/A; . Oropharyngeal resection  02/1997  For tongue cancer . PACEMAKER IMPLANT N/A 08/01/2017  Procedure: PACEMAKER IMPLANT;  Surgeon: Constance Haw, MD;  Location: [facility] CV LAB;  Service: Cardiovascular;  Laterality: N/A; . TRIGGER FINGER RELEASE Right 1970's  "2 fingers" . WART FULGURATION Left 09/15/2015  Procedure: EXCISIONal biospy of left peri anual and anual canal mass;  Surgeon: Greer Pickerel, MD;  Location: [facility] ORS;  Service: General;  Laterality: Left; HPI: Pt is a 84 y.o. male with a PMH significant for CAD, A. fib, HTN, COPD, GERD, pneumonia ("at least 5 times" 06/12/2017), stroke (May 2019), and tongue cancer (1998). He presented to [facility] ED from home on 11/24/17 with SOB and cough. Per wife, pt has had poor appetite, increasing weakness over last several days, and 2 recent falls (1 fall 11/15; another on 11/16). Head CT revealed no acute intracranial abnormality, but  encephalomalacic changes in medial R frontal lobe. CXR showed mild patchy bilateral lower lung opacities, suspicious for multifocal pneumonia.  No data recorded Assessment / Plan / Recommendation CHL IP CLINICAL IMPRESSIONS 11/26/2017 Clinical Impression Pt exhibits mild oral and moderate pharyngeal dysphagia with impairments resulting from multifactors including recent stroke (5/19 with 5 pna episodes since), anatomical difference (suspect cervical spine kyphosis), decreased timing of swallow initiation/laryngeal vestibule closure, mildly reduced stripping wave and questionable UES function. Occasional lingual pumping and lingual residue with honey thick liquids as well as significantly prolonged mastication observed with regular texture. Thin and nectar liquids were penetrated to vocal fold level  without sensation primarily prior to swallow initiation indicative of decreased timing of swallow mechanisms. Multiple compensatory strategies (chin tuck, breath hold, head turn to left) attempted with thin and nectar, however none were beneficial to aid in more timely/complete airway protection. Although moderate vallecular residue was present with honey thick liquids, only one instance of trace penetration above the vocal folds was noted. Verbal cues for second swallows were mildy effective to reduce residue. Intermittently reduced upper esophageal sphincter (UES) relaxation observed with thin and nectar exacerbated by implementation of chin tuck strategy. Suspected reduced pharyngeal stripping wave resulting in pharyngeal/vallecular residue with difficulty clearing. Recommend dysphagia 2 (minced) diet texture and short term use of honey thick liquids and full supervision to ensure upright positioning, small bites/sips, intermittent cough/throat clear throughout PO intake, and extra dry swallows to aid in clearance of vallecular residue. ST will continue to follow to provide treatment with diet safety, efficiency while  pt remains in acute setting and potential to upgrade liquid consistency via exercise, RMT. SLP Visit Diagnosis Dysphagia, oropharyngeal phase (R13.12) Attention and concentration deficit following -- Frontal lobe and executive function deficit following -- Impact on safety and function Severe aspiration risk   CHL IP TREATMENT RECOMMENDATION 11/26/2017 Treatment Recommendations Therapy as outlined in treatment plan below   Prognosis 11/26/2017 Prognosis for Safe Diet Advancement Fair Barriers to Reach Goals -- Barriers/Prognosis Comment -- CHL IP DIET RECOMMENDATION 11/26/2017 SLP Diet Recommendations Dysphagia 2 (Fine chop) solids;Honey thick liquids Liquid Administration via Cup Medication Administration Crushed with puree Compensations Slow rate;Small sips/bites;Clear throat intermittently;Multiple dry swallows after each bite/sip Postural Changes Seated upright at 90 degrees   CHL IP OTHER RECOMMENDATIONS 11/26/2017 Recommended Consults -- Oral Care Recommendations Oral care BID Other Recommendations --   CHL IP FOLLOW UP RECOMMENDATIONS 11/26/2017 Follow up Recommendations Home health SLP   CHL IP FREQUENCY AND DURATION 11/26/2017 Speech Therapy Frequency (ACUTE ONLY) min 2x/week Treatment Duration 2 weeks      CHL IP ORAL PHASE 11/26/2017 Oral Phase Impaired Oral - Pudding Teaspoon -- Oral - Pudding Cup -- Oral - Honey Teaspoon -- Oral - Honey Cup Lingual/palatal residue Oral - Nectar Teaspoon -- Oral - Nectar Cup WFL Oral - Nectar Straw -- Oral - Thin Teaspoon -- Oral - Thin Cup WFL Oral - Thin Straw -- Oral - Puree Lingual pumping Oral - Mech Soft -- Oral - Regular Lingual pumping;Impaired mastication Oral - Multi-Consistency -- Oral - Pill -- Oral Phase - Comment --  CHL IP PHARYNGEAL PHASE 11/26/2017 Pharyngeal Phase Impaired Pharyngeal- Pudding Teaspoon -- Pharyngeal -- Pharyngeal- Pudding Cup -- Pharyngeal -- Pharyngeal- Honey Teaspoon -- Pharyngeal -- Pharyngeal- Honey Cup Pharyngeal residue -  pyriform;Pharyngeal residue - valleculae;Penetration/Aspiration during swallow;Reduced pharyngeal peristalsis Pharyngeal Material enters airway, remains ABOVE vocal cords and not ejected out Pharyngeal- Nectar Teaspoon -- Pharyngeal -- Pharyngeal- Nectar Cup Penetration/Aspiration before swallow;Reduced pharyngeal peristalsis;Pharyngeal residue - valleculae;Pharyngeal residue - pyriform;Compensatory strategies attempted (with notebox) Pharyngeal Material enters airway, CONTACTS cords and not ejected out Pharyngeal- Nectar Straw -- Pharyngeal -- Pharyngeal- Thin Teaspoon -- Pharyngeal -- Pharyngeal- Thin Cup Pharyngeal residue - valleculae;Reduced pharyngeal peristalsis;Penetration/Aspiration before swallow;Penetration/Aspiration during swallow;Compensatory strategies attempted (with notebox) Pharyngeal Material enters airway, CONTACTS cords and not ejected out Pharyngeal- Thin Straw -- Pharyngeal -- Pharyngeal- Puree Pharyngeal residue - valleculae Pharyngeal -- Pharyngeal- Mechanical Soft -- Pharyngeal -- Pharyngeal- Regular Pharyngeal residue - valleculae Pharyngeal -- Pharyngeal- Multi-consistency -- Pharyngeal -- Pharyngeal- Pill -- Pharyngeal -- Pharyngeal Comment --  CHL IP CERVICAL ESOPHAGEAL PHASE 11/26/2017 Cervical Esophageal  Phase Impaired Pudding Teaspoon -- Pudding Cup -- Honey Teaspoon -- Honey Cup WFL Nectar Teaspoon -- Nectar Cup Reduced cricopharyngeal relaxation Nectar Straw -- Thin Teaspoon -- Thin Cup Reduced cricopharyngeal relaxation;Other (Comment) Thin Straw -- Puree WFL Mechanical Soft -- Regular WFL Multi-consistency -- Pill -- Cervical Esophageal Comment (No Data) Houston Siren 11/26/2017, 1:39 PM Orbie Pyo Colvin Caroli.Ed Actor Pager [phone] Office [phone]               Time Spent in minutes  30   Lala Lund M.D on 12/18/2017 at 10:38 AM  To page go to [email or web address] - password [facility]

## 2017-12-18 NOTE — Progress Notes (Addendum)
[facility])    Notified by Whitman Hero Case Manager, of family request for [facility] services at home after discharge.  Chart and patient information under review by [facility] physician at this time and eligibility is pending.    Spoke with [place] (son), confirmed interest, answered questions, provided emotional support, and information regarding hospice services and philosophy.      Plan is to discharge tentatively tomorrow.     Please send signed and completed DNR home with the patient.   Patient will need prescriptions for discharge comfort medications (if necessary).   DME needs discussed, patient currently has the following equipment in the home:  Bed, hoyer lift, wheelchair, walker DME that will be needed:  Per son, no needs    [facility] is aware of the above information.  Completed discharge summary should be faxed to [phone], once eligibility confirmed, and completed.     Please notify [facility] at [phone] (if after 5 call [phone]) when the patient is ready to leave the unit.    [facility] information given to [place], son.   Above information shared with Levada Dy, Case Manager.   Thank you for this referral Venia Carbon BSN, RN [facility] Liaison (listed in [place]) [phone]?  **hospice eligibility has been confirmed for home hospice once discharged, by [facility] physician

## 2017-12-18 NOTE — Progress Notes (Signed)
[facility]  Patient Status: Active (receiving services up to time of hospitalization)  [facility] is providing the following services: RN, PT and ST  If patient discharges after hours, please call [phone].   Chad [name] 12/18/2017, 9:56 AM

## 2017-12-19 ENCOUNTER — Other Ambulatory Visit: Payer: Self-pay

## 2017-12-19 DIAGNOSIS — N184 Chronic kidney disease, stage 4 (severe): Secondary | ICD-10-CM

## 2017-12-19 DIAGNOSIS — Z66 Do not resuscitate: Secondary | ICD-10-CM

## 2017-12-19 DIAGNOSIS — J449 Chronic obstructive pulmonary disease, unspecified: Secondary | ICD-10-CM

## 2017-12-19 LAB — URINE CULTURE: Culture: >=100000 — AB

## 2017-12-19 MED ORDER — PRO-STAT SUGAR FREE PO LIQD: 30.0000 mL | Freq: Two times a day (BID) | ORAL | 0 refills | Status: AC

## 2017-12-19 MED ORDER — AMOXICILLIN-POT CLAVULANATE 500-125 MG PO TABS: 1.0000 | ORAL_TABLET | Freq: Three times a day (TID) | ORAL | 0 refills | Status: DC

## 2017-12-19 NOTE — Plan of Care (Signed)

## 2017-12-19 NOTE — Plan of Care (Signed)

## 2017-12-19 NOTE — Progress Notes (Signed)
Patient discharged with home with  Hospice, transported home by  [facility] services.  Ermine Stebbins, Tivis Ringer, RN

## 2017-12-19 NOTE — Consult Note (Signed)
   [facility] CM Inpatient Consult   12/19/2017  Chad [name] Nov 09, 1931 [id number]    [facility] Care Management follow up.  Chart reviewed. [name] discharged home with hospice services with [facility].   Will alert [facility] of disposition.  Marthenia Rolling, MSN-Ed, RN,BSN [facility] Liaison [phone]

## 2017-12-19 NOTE — Discharge Summary (Signed)
Chad [name]:[id number] DOB: 08-16-1931 DOA: 12/16/2017  PCP: Cassandria Anger, MD  Admit date: 12/16/2017  Discharge date: 12/19/2017  Admitted From: Home   Disposition: Home   Recommendations for Outpatient Follow-up:   Follow up with PCP in 1-2 weeks  PCP Please obtain BMP/CBC, 2 view CXR in 1week,  (see Discharge instructions)   PCP Please follow up on the following pending results:     Home Health: Home with Hospice  Equipment/Devices: None Consultations: Pall Care Discharge Condition: Fair   CODE STATUS: DNR   Diet Recommendation: Dysphagia 2 diet with feeding assistance and aspiration precautions   Chief Complaint  Patient presents with  . possible pneumonia     Brief history of present illness from the day of admission and additional interim summary    Chad [name] a84 y.o.male,with history of paroxysmal atrial fibrillation Mali vas 2 score of at least 5 including history of CVA/TIA on Eliquis, CAD, aspiration pneumonia recently admitted for it, failure to thrive, severe protein calorie malnutrition, COPD, sacral decubitus ulcer stage unknown who was recently admitted to the hospital for acute hypoxic respiratory failure due to aspiration pneumonia and was discharged home about 3 weeks ago comes back brought in by family with chief complaints of cough, fever, decreased oral intake and decreased mental status.  Patient currently is somnolent history obtained by son who is bedside, according to the son for the last 7 to 8 days patient has been gradually developing a cough, low-grade fevers, has not been eating or drinking well, since this morning he had been confused at home and was hence brought to the ER where he was diagnosed with aspiration pneumonia and I was called to admit. Son  denies any known event of aspiration, he agrees that patient has been gradually declining and now has been wheelchair and bedbound for the last month.                                                                 Hospital Course    1.Acute hypoxic respiratory failure due to recurrent aspiration pneumonia. Patient appears extremely frail and deconditioned, this is his second episode of aspiration pneumonia in 3 weeks, I think he is now in his terminal decline. plan was discussed with patient, son and wife, gentle medical Rx, comfort feeds, open to Hospice, for now he is improved with Unasyn and gentle hydration, will be placed on 4 more days of oral Augmentin, dysphagia 2 diet and will be discharged home with hospice, goal of care will be comfort, currently he is symptom-free and feeling good however long-term prognosis remains very poor due to continued intermittent aspiration.  2.Paroxysmal atrial fibrillation with Mali vas 2 score of at least 4 with history of TIA/CVA. Currently not on rate controlling agents, continue Eliquis per  pharmacy.  3.GERD. On PPI.  4.Dyslipidemia. On statin continue.  5. HXof CVA/TIA. Continue Eliquis and statin for secondary prevention.  6.History of CAD. No acute issues. Supportive care.   7.Failure to thrive with severe protein calorie malnutrition. On pro-stat, palliative care meeting soon for long-term goals of care, family reasonable.  8.  Urinary colonization.  Responded empirically to Unasyn, infection likely was pneumonia and not UTI.  Monitor clinically.  9. ARF on CKD 4 - resolved after IVF, creat of 1.4  Is the baseline.   Discharge diagnosis     Principal Problem:   Aspiration pneumonia (HCC) Active Problems:   COPD (chronic obstructive pulmonary disease) with chronic bronchitis (HCC)   CVA (cerebral vascular accident) ([place])   History of CVA (cerebrovascular accident)-May 2019   Chronic kidney disease (CKD),  stage IV (severe) (HCC)   HLD (hyperlipidemia)   Paroxysmal atrial flutter (HCC)   Acute respiratory failure with hypoxemia (HCC)   CAD (coronary artery disease)   DNR (do not resuscitate)   Goals of care, counseling/discussion   Palliative care by specialist    Discharge instructions    Discharge Instructions    Discharge instructions   Complete by:  As directed    Follow with Primary MD Plotnikov, Evie Lacks, MD in 7 days   Get CBC, CMP, 2 view Chest X ray -  checked  by Primary MD in 5-7 days    Activity: As tolerated with Full fall precautions use walker/cane & assistance as needed  Disposition Home    Diet: Dysphagia 2 diet with feeding assistance and aspiration precautions.  Special Instructions: If you have smoked or chewed Tobacco  in the last 2 yrs please stop smoking, stop any regular Alcohol  and or any Recreational drug use.  On your next visit with your primary care physician please Get Medicines reviewed and adjusted.  Please request your Prim.MD to go over all Hospital Tests and Procedure/Radiological results at the follow up, please get all Hospital records sent to your Prim MD by signing hospital release before you go home.  If you experience worsening of your admission symptoms, develop shortness of breath, life threatening emergency, suicidal or homicidal thoughts you must seek medical attention immediately by calling 911 or calling your MD immediately  if symptoms less severe.  You Must read complete instructions/literature along with all the possible adverse reactions/side effects for all the Medicines you take and that have been prescribed to you. Take any new Medicines after you have completely understood and accpet all the possible adverse reactions/side effects.   Increase activity slowly   Complete by:  As directed       Discharge Medications   Allergies as of 12/19/2017      Reactions   Dyazide [hydrochlorothiazide W-triamterene] Other (See  Comments)   Caused hyponatremia 04/2012   Lisinopril Cough      Medication List    TAKE these medications   amoxicillin-clavulanate 500-125 MG tablet Commonly known as:  AUGMENTIN Take 1 tablet (500 mg total) by mouth 3 (three) times daily.   apixaban 2.5 MG Tabs tablet Commonly known as:  ELIQUIS Take 1 tablet (2.5 mg total) by mouth 2 (two) times daily. Resume 7/28/19PM What changed:  additional instructions   atorvastatin 40 MG tablet Commonly known as:  LIPITOR Take 1 tablet (40 mg total) by mouth daily at 6 PM. What changed:  how much to take   cholecalciferol 1000 units tablet Commonly known as:  VITAMIN D Take 1  tablet (1,000 Units total) by mouth daily.   clidinium-chlordiazePOXIDE 5-2.5 MG capsule Commonly known as:  LIBRAX Take 1 capsule by mouth 2 (two) times daily.   cyanocobalamin 1000 MCG tablet Take 1,000 mcg by mouth daily.   escitalopram 10 MG tablet Commonly known as:  LEXAPRO Take 10 mg by mouth every morning.   famotidine 20 MG tablet Commonly known as:  PEPCID Take 1 tablet (20 mg total) by mouth at bedtime.   feeding supplement (ENSURE ENLIVE) Liqd Take 237 mLs by mouth 2 (two) times daily between meals. What changed:  when to take this   feeding supplement (PRO-STAT SUGAR FREE 64) Liqd Take 30 mLs by mouth 2 (two) times daily.   fluticasone 50 MCG/ACT nasal spray Commonly known as:  FLONASE USE 2 SPRAYS IN EACH NOSTRIL DAILY What changed:    when to take this  reasons to take this   gabapentin 300 MG capsule Commonly known as:  NEURONTIN Take 3 capsules at bedtime. What changed:    how much to take  how to take this  when to take this  additional instructions   ipratropium 0.02 % nebulizer solution Commonly known as:  ATROVENT Take 2.5 mLs (0.5 mg total) by nebulization 3 (three) times daily. Via HHN   mirtazapine 15 MG tablet Commonly known as:  REMERON Take 15 mg by mouth at bedtime.   multivitamin-lutein Caps  capsule Take 1 capsule by mouth daily.   pantoprazole 40 MG tablet Commonly known as:  PROTONIX Take 1 tablet (40 mg total) by mouth daily. Take 30-60 min before first meal of the day   polyethylene glycol powder powder Commonly known as:  GLYCOLAX/MIRALAX Take 17 g by mouth 2 (two) times daily as needed. What changed:  reasons to take this   RESOURCE THICKENUP CLEAR Powd Take 1 g by mouth as needed. What changed:    how much to take  when to take this  additional instructions   senna-docusate 8.6-50 MG tablet Commonly known as:  Senokot-S Take 1 tablet by mouth daily.   tetrahydrozoline 0.05 % ophthalmic solution Place 1 drop into both eyes as needed (dry eyes).       Follow-up Information    Plotnikov, Evie Lacks, MD. Schedule an appointment as soon as possible for a visit in 1 week(s).   Specialty:  Internal Medicine Contact information: [place] [address] [phone]        Lelon Perla, MD .   Specialty:  Cardiology Contact information: [address] [place] [address] [phone]        Constance Haw, MD .   Specialty:  Cardiology Contact information: [address] [place] [place] [address] [phone]           Major procedures and Radiology Reports - PLEASE review detailed and final reports thoroughly  -       Dg Chest 2 View  Result Date: 12/16/2017 CLINICAL DATA:  Shortness of breath EXAM: CHEST - 2 VIEW COMPARISON:  11/24/2017, 08/14/2017 FINDINGS: Left-sided pacing device as before. Worsening bilateral lung base consolidation posteriorly. No pleural effusion. Mild cardiomegaly with aortic atherosclerosis. No pneumothorax. IMPRESSION: 1. Worsening bilateral lung base consolidations, possible pneumonia; imaging follow-up to resolution recommended to exclude underlying mass lesion given somewhat irregular appearance of the consolidation on the lateral view. Probable small left effusion.  2. Mild cardiomegaly. Electronically Signed   By: Donavan Foil M.D.   On: 12/16/2017 16:57  Ct Head Wo Contrast  Result Date: 11/24/2017 CLINICAL DATA:  Altered mental status, fever, generalized weakness EXAM: CT HEAD WITHOUT CONTRAST TECHNIQUE: Contiguous axial images were obtained from the base of the skull through the vertex without intravenous contrast. COMPARISON:  08/01/2016 FINDINGS: Brain: No evidence of acute infarction, hemorrhage, hydrocephalus, extra-axial collection or mass lesion/mass effect. Encephalomalacic changes in the medial right frontal lobe. Subcortical white matter and periventricular small vessel ischemic changes. Vascular: Intracranial atherosclerosis. Skull: Normal. Negative for fracture or focal lesion. Sinuses/Orbits: The visualized paranasal sinuses are essentially clear. The mastoid air cells are unopacified. Other: None. IMPRESSION: No evidence of acute intracranial abnormality. Encephalomalacic changes in the medial right frontal lobe. Small vessel ischemic changes. Electronically Signed   By: Julian Hy M.D.   On: 11/24/2017 15:42   Dg Chest Port 1 View  Result Date: 11/24/2017 CLINICAL DATA:  Altered level of consciousness EXAM: PORTABLE CHEST 1 VIEW COMPARISON:  08/14/2017 FINDINGS: Mild patchy bilateral lower lung opacities, suspicious for multifocal pneumonia, possibly on the basis of aspiration. No pleural effusion or pneumothorax. Biapical pleural-parenchymal scarring. The heart is normal in size. Left subclavian pacemaker. IMPRESSION: Mild patchy bilateral lower lung opacities, suspicious for multifocal pneumonia, possibly on the basis of aspiration. Electronically Signed   By: Julian Hy M.D.   On: 11/24/2017 15:39   Dg Swallowing Func-speech Pathology  Result Date: 11/26/2017 Objective Swallowing Evaluation: Type of Study: MBS-Modified Barium Swallow Study  Patient Details Name: ZIQUAN FIDEL MRN: [id number] Date of Birth: [birth year] Today's  Date: 11/26/2017 Time: SLP Start Time (ACUTE ONLY): 1007 -SLP Stop Time (ACUTE ONLY): 9518 SLP Time Calculation (min) (ACUTE ONLY): 26 min Past Medical History: Past Medical History: Diagnosis Date . Arthritis   "hands" (01/12/2013); feet (podiatry consultation); "back" (06/12/2017) . Atrial flutter ([place])   a. Slow ~100bpm when in 2:1; dx 04/2012. b. Placed on Xarelto.  c. s/p ablation 07-08-2012 by Dr Rayann Heman . Carotid artery calcification   By CXR - dopplers 05/06/12 without obvious evidence of significant ICA stenosis >40% . Colon polyps 06/26/2005 . COPD (chronic obstructive pulmonary disease) ([place])  . Diverticulosis of colon (without mention of hemorrhage)  . Gastritis, chronic   Pt denies bleeding 04/2012. EGD 2012 reportedly normal. . GERD (gastroesophageal reflux disease)  . Hypertension  . Hyponatremia   04/2012 r/t diuretic. . IBS (irritable bowel syndrome)  . Internal and external hemorrhoids without complication  . Macular degeneration of left eye  . Pneumonia   "@ least 5 times" (06/12/2017) . Stroke ([place])  . Tongue cancer ([place]) 02/1997 Past Surgical History: Past Surgical History: Procedure Laterality Date . ATRIAL FLUTTER ABLATION N/A 07/08/2012  Procedure: ATRIAL FLUTTER ABLATION;  Surgeon: Thompson Grayer, MD;  Location: [facility] CATH LAB;  Service: Cardiovascular;  Laterality: N/A; . CARDIAC CATHETERIZATION  04/2012 . CATARACT EXTRACTION W/ INTRAOCULAR LENS IMPLANT Left  . COLONOSCOPY  08/29/10  diverticulosis, internal hemorrhoids . ESOPHAGOGASTRODUODENOSCOPY  09/20/10  normal . [place] . LEFT HEART CATHETERIZATION WITH CORONARY ANGIOGRAM N/A 05/06/2012  Procedure: LEFT HEART CATHETERIZATION WITH CORONARY ANGIOGRAM;  Surgeon: Peter M Martinique, MD;  Location: [facility] CATH LAB;  Service: Cardiovascular;  Laterality: N/A; . Oropharyngeal resection  02/1997  For tongue cancer . PACEMAKER IMPLANT N/A 08/01/2017  Procedure: PACEMAKER IMPLANT;  Surgeon: Constance Haw, MD;  Location: [place] CV LAB;  Service:  Cardiovascular;  Laterality: N/A; . TRIGGER FINGER RELEASE Right 1970's  "2 fingers" . WART FULGURATION Left 09/15/2015  Procedure: EXCISIONal biospy of left peri anual and  anual canal mass;  Surgeon: Greer Pickerel, MD;  Location: [facility] ORS;  Service: General;  Laterality: Left; HPI: Pt is a 84 y.o. male with a PMH significant for CAD, A. fib, HTN, COPD, GERD, pneumonia ("at least 5 times" 06/12/2017), stroke (May 2019), and tongue cancer (1998). He [facility] ED from home on 11/24/17 with SOB and cough. Per wife, pt has had poor appetite, increasing weakness over last several days, and 2 recent falls (1 fall 11/15; another on 11/16). Head CT revealed no acute intracranial abnormality, but encephalomalacic changes in medial R frontal lobe. CXR showed mild patchy bilateral lower lung opacities, suspicious for multifocal pneumonia.  No data recorded Assessment / Plan / Recommendation CHL IP CLINICAL IMPRESSIONS 11/26/2017 Clinical Impression Pt exhibits mild oral and moderate pharyngeal dysphagia with impairments resulting from multifactors including recent stroke (5/19 with 5 pna episodes since), anatomical difference (suspect cervical spine kyphosis), decreased timing of swallow initiation/laryngeal vestibule closure, mildly reduced stripping wave and questionable UES function. Occasional lingual pumping and lingual residue with honey thick liquids as well as significantly prolonged mastication observed with regular texture. Thin and nectar liquids were penetrated to vocal fold level without sensation primarily prior to swallow initiation indicative of decreased timing of swallow mechanisms. Multiple compensatory strategies (chin tuck, breath hold, head turn to left) attempted with thin and nectar, however none were beneficial to aid in more timely/complete airway protection. Although moderate vallecular residue was present with honey thick liquids, only one instance of trace penetration above the vocal folds was  noted. Verbal cues for second swallows were mildy effective to reduce residue. Intermittently reduced upper esophageal sphincter (UES) relaxation observed with thin and nectar exacerbated by implementation of chin tuck strategy. Suspected reduced pharyngeal stripping wave resulting in pharyngeal/vallecular residue with difficulty clearing. Recommend dysphagia 2 (minced) diet texture and short term use of honey thick liquids and full supervision to ensure upright positioning, small bites/sips, intermittent cough/throat clear throughout PO intake, and extra dry swallows to aid in clearance of vallecular residue. ST will continue to follow to provide treatment with diet safety, efficiency while pt remains in acute setting and potential to upgrade liquid consistency via exercise, RMT. SLP Visit Diagnosis Dysphagia, oropharyngeal phase (R13.12) Attention and concentration deficit following -- Frontal lobe and executive function deficit following -- Impact on safety and function Severe aspiration risk   CHL IP TREATMENT RECOMMENDATION 11/26/2017 Treatment Recommendations Therapy as outlined in treatment plan below   Prognosis 11/26/2017 Prognosis for Safe Diet Advancement Fair Barriers to Reach Goals -- Barriers/Prognosis Comment -- CHL IP DIET RECOMMENDATION 11/26/2017 SLP Diet Recommendations Dysphagia 2 (Fine chop) solids;Honey thick liquids Liquid Administration via Cup Medication Administration Crushed with puree Compensations Slow rate;Small sips/bites;Clear throat intermittently;Multiple dry swallows after each bite/sip Postural Changes Seated upright at 90 degrees   CHL IP OTHER RECOMMENDATIONS 11/26/2017 Recommended Consults -- Oral Care Recommendations Oral care BID Other Recommendations --   CHL IP FOLLOW UP RECOMMENDATIONS 11/26/2017 Follow up Recommendations Home health SLP   CHL IP FREQUENCY AND DURATION 11/26/2017 Speech Therapy Frequency (ACUTE ONLY) min 2x/week Treatment Duration 2 weeks      CHL IP ORAL  PHASE 11/26/2017 Oral Phase Impaired Oral - Pudding Teaspoon -- Oral - Pudding Cup -- Oral - Honey Teaspoon -- Oral - Honey Cup Lingual/palatal residue Oral - Nectar Teaspoon -- Oral - Nectar Cup WFL Oral - Nectar Straw -- Oral - Thin Teaspoon -- Oral - Thin Cup WFL Oral - Thin Straw -- Oral - Puree Lingual  pumping Oral - Mech Soft -- Oral - Regular Lingual pumping;Impaired mastication Oral - Multi-Consistency -- Oral - Pill -- Oral Phase - Comment --  CHL IP PHARYNGEAL PHASE 11/26/2017 Pharyngeal Phase Impaired Pharyngeal- Pudding Teaspoon -- Pharyngeal -- Pharyngeal- Pudding Cup -- Pharyngeal -- Pharyngeal- Honey Teaspoon -- Pharyngeal -- Pharyngeal- Honey Cup Pharyngeal residue - pyriform;Pharyngeal residue - valleculae;Penetration/Aspiration during swallow;Reduced pharyngeal peristalsis Pharyngeal Material enters airway, remains ABOVE vocal cords and not ejected out Pharyngeal- Nectar Teaspoon -- Pharyngeal -- Pharyngeal- Nectar Cup Penetration/Aspiration before swallow;Reduced pharyngeal peristalsis;Pharyngeal residue - valleculae;Pharyngeal residue - pyriform;Compensatory strategies attempted (with notebox) Pharyngeal Material enters airway, CONTACTS cords and not ejected out Pharyngeal- Nectar Straw -- Pharyngeal -- Pharyngeal- Thin Teaspoon -- Pharyngeal -- Pharyngeal- Thin Cup Pharyngeal residue - valleculae;Reduced pharyngeal peristalsis;Penetration/Aspiration before swallow;Penetration/Aspiration during swallow;Compensatory strategies attempted (with notebox) Pharyngeal Material enters airway, CONTACTS cords and not ejected out Pharyngeal- Thin Straw -- Pharyngeal -- Pharyngeal- Puree Pharyngeal residue - valleculae Pharyngeal -- Pharyngeal- Mechanical Soft -- Pharyngeal -- Pharyngeal- Regular Pharyngeal residue - valleculae Pharyngeal -- Pharyngeal- Multi-consistency -- Pharyngeal -- Pharyngeal- Pill -- Pharyngeal -- Pharyngeal Comment --  CHL IP CERVICAL ESOPHAGEAL PHASE 11/26/2017 Cervical Esophageal  Phase Impaired Pudding Teaspoon -- Pudding Cup -- Honey Teaspoon -- Honey Cup WFL Nectar Teaspoon -- Nectar Cup Reduced cricopharyngeal relaxation Nectar Straw -- Thin Teaspoon -- Thin Cup Reduced cricopharyngeal relaxation;Other (Comment) Thin Straw -- Puree WFL Mechanical Soft -- Regular WFL Multi-consistency -- Pill -- Cervical Esophageal Comment (No Data) Houston Siren 11/26/2017, 1:39 PM Orbie Pyo Colvin Caroli.Ed Risk analyst [phone] Office [phone]               Micro Results     Recent Results (from the past 240 hour(s))  Blood Culture (routine x 2)     Status: None (Preliminary result)   Collection Time: 12/16/17  1:29 PM  Result Value Ref Range Status   Specimen Description BLOOD RIGHT ANTECUBITAL  Final   Special Requests   Final    BOTTLES DRAWN AEROBIC AND ANAEROBIC Blood Culture adequate volume   Culture   Final    NO GROWTH 2 DAYS Performed at [facility], [address], [place], [place] [address]    Report Status PENDING  Incomplete  Blood Culture (routine x 2)     Status: None (Preliminary result)   Collection Time: 12/16/17  2:21 PM  Result Value Ref Range Status   Specimen Description BLOOD LEFT ANTECUBITAL  Final   Special Requests   Final    BOTTLES DRAWN AEROBIC AND ANAEROBIC Blood Culture adequate volume   Culture   Final    NO GROWTH 2 DAYS Performed at [facility], [place] [address] [place], [place] [address]    Report Status PENDING  Incomplete  Urine culture     Status: Abnormal   Collection Time: 12/17/17  7:08 AM  Result Value Ref Range Status   Specimen Description URINE, RANDOM  Final   Special Requests   Final    NONE Performed at [facility], [place] [address] [place], [place] [address]    Culture (A)  Final    >=100,000 COLONIES/mL ENTEROCOCCUS FAECALIS 50,000 COLONIES/mL ENTEROBACTER AEROGENES    Report Status 12/19/2017 FINAL  Final   Organism ID, Bacteria ENTEROCOCCUS FAECALIS (A)   Final   Organism ID, Bacteria ENTEROBACTER AEROGENES (A)  Final      Susceptibility   Enterobacter aerogenes - MIC*    CEFAZOLIN >=64 RESISTANT  Resistant     CEFTRIAXONE 32 INTERMEDIATE Intermediate     CIPROFLOXACIN <=0.25 SENSITIVE Sensitive     GENTAMICIN <=1 SENSITIVE Sensitive     IMIPENEM 2 SENSITIVE Sensitive     NITROFURANTOIN 64 INTERMEDIATE Intermediate     TRIMETH/SULFA <=20 SENSITIVE Sensitive     PIP/TAZO >=128 RESISTANT Resistant     * 50,000 COLONIES/mL ENTEROBACTER AEROGENES   Enterococcus faecalis - MIC*    AMPICILLIN <=2 SENSITIVE Sensitive     LEVOFLOXACIN 2 SENSITIVE Sensitive     NITROFURANTOIN <=16 SENSITIVE Sensitive     VANCOMYCIN 1 SENSITIVE Sensitive     * >=100,000 COLONIES/mL ENTEROCOCCUS FAECALIS  C difficile quick scan w PCR reflex     Status: None   Collection Time: 12/18/17 12:59 AM  Result Value Ref Range Status   C Diff antigen NEGATIVE NEGATIVE Final   C Diff toxin NEGATIVE NEGATIVE Final   C Diff interpretation No C. difficile detected.  Final    Comment: Performed at [facility], [place] [address], [place], [place] [address]    Today   Subjective    Romir Klimowicz today has no headache,no chest abdominal pain,no new weakness tingling or numbness, feels much better wants to go home today.    Objective   Blood pressure (!) 155/73, pulse 77, temperature 98.6 F (37 C), temperature source Oral, resp. rate 18, height 6\' 3"  (1.905 m), weight 73 kg, SpO2 98 %.   Intake/Output Summary (Last 24 hours) at 12/19/2017 1128 Last data filed at 12/19/2017 0900 Gross per 24 hour  Intake 100.28 ml  Output 725 ml  Net -624.72 ml    Exam Awake Alert,   No new F.N deficits, Normal affect [place].AT,PERRAL Supple Neck,No JVD, No cervical lymphadenopathy appriciated.  Symmetrical Chest wall movement, Good air movement bilaterally, CTAB RRR,No Gallops,Rubs or new Murmurs, No Parasternal Heave +ve B.Sounds, Abd Soft, Non tender, No organomegaly  appriciated, No rebound -guarding or rigidity. No Cyanosis, Clubbing or edema, No new Rash or bruise   Data Review   CBC w Diff:  Lab Results  Component Value Date   WBC 10.1 12/17/2017   HGB 10.0 (L) 12/17/2017   HGB CANCELED 03/14/2016   HCT 32.4 (L) 12/17/2017   HCT CANCELED 03/14/2016   PLT 280 12/17/2017   PLT CANCELED 03/14/2016   LYMPHOPCT 8 12/16/2017   MONOPCT 8 12/16/2017   EOSPCT 0 12/16/2017   BASOPCT 0 12/16/2017    CMP:  Lab Results  Component Value Date   NA 147 (H) 12/17/2017   NA 136 06/27/2017   K 3.5 12/17/2017   CL 109 12/17/2017   CO2 27 12/17/2017   BUN 20 12/17/2017   BUN 17 06/27/2017   CREATININE 1.47 (H) 12/17/2017   CREATININE 1.24 (H) 12/06/2015   PROT 6.2 (L) 12/16/2017   PROT 6.9 03/14/2016   ALBUMIN 2.0 (L) 12/16/2017   ALBUMIN 3.6 03/14/2016   BILITOT 0.7 12/16/2017   BILITOT 0.2 03/14/2016   ALKPHOS 79 12/16/2017   AST 37 12/16/2017   ALT 25 12/16/2017  .   Total Time in preparing paper work, data evaluation and todays exam - 58 minutes  Lala Lund M.D on 12/19/2017 at 11:28 AM  [facility]   Office  [phone]

## 2017-12-19 NOTE — Patient Outreach (Signed)
Case closure:  Notified by [facility] hospital liaison that patient discharged home with hospice.  PLAN: close case and notify MD.  Tomasa Rand, RN, BSN, CEN [place] Coordinator [phone]

## 2017-12-19 NOTE — Progress Notes (Signed)
PT Cancellation Note  Patient Details Name: Chad [name] MRN: [id number] DOB: 12-18-1931   Cancelled Treatment:    Reason Eval/Treat Not Completed: (P) Medical issues which prohibited therapy(Pt to d/c home with hospice.  CM and nursing working on d/c at this time.  Will defer PT.  )   Cristela Blue 12/19/2017, 11:21 AM

## 2017-12-20 ENCOUNTER — Telehealth: Payer: Self-pay | Admitting: *Deleted

## 2017-12-20 ENCOUNTER — Other Ambulatory Visit: Payer: Self-pay | Admitting: *Deleted

## 2017-12-20 MED ORDER — SENNOSIDES-DOCUSATE SODIUM 8.6-50 MG PO TABS: 1.0000 | ORAL_TABLET | Freq: Every day | ORAL | 1 refills | Status: AC

## 2017-12-20 MED ORDER — ESCITALOPRAM OXALATE 10 MG PO TABS: 10.0000 mg | ORAL_TABLET | ORAL | 1 refills | Status: DC

## 2017-12-20 MED ORDER — IPRATROPIUM BROMIDE 0.02 % IN SOLN: 0.5000 mg | Freq: Three times a day (TID) | RESPIRATORY_TRACT | 1 refills | Status: AC

## 2017-12-20 MED ORDER — PANTOPRAZOLE SODIUM 40 MG PO TBEC: 40.0000 mg | DELAYED_RELEASE_TABLET | Freq: Every day | ORAL | 1 refills | Status: DC

## 2017-12-20 MED ORDER — APIXABAN 2.5 MG PO TABS: 2.5000 mg | ORAL_TABLET | Freq: Two times a day (BID) | ORAL | 1 refills | Status: DC

## 2017-12-20 MED ORDER — FAMOTIDINE 20 MG PO TABS: 20.0000 mg | ORAL_TABLET | Freq: Every day | ORAL | 1 refills | Status: DC

## 2017-12-20 MED ORDER — FLUTICASONE PROPIONATE 50 MCG/ACT NA SUSP: 2.0000 | Freq: Every day | NASAL | 1 refills | Status: DC | PRN

## 2017-12-20 MED ORDER — GABAPENTIN 300 MG PO CAPS: ORAL_CAPSULE | ORAL | 1 refills | Status: DC

## 2017-12-20 NOTE — Telephone Encounter (Signed)
Tried calling pt to make TCM hosp f/u appt w/PCP no answer. LMOM RTC to make appt within 7-14 days. Sent CRM to [facility] for FYI.Marland KitchenJohny Chess

## 2017-12-20 NOTE — Telephone Encounter (Signed)
Helene Kelp from Hospice calling to advise that patient's medications were accidentally thrown away after his hospital d/c. She is requesting all new Rxs be sent to [facility] [address]. I sent all maintenance meds to [facility]. See meds.   Patient also needs 2 controlled rxs:  Mirtazapine and Librax. Will route to PCP and his CMA for review/approval.

## 2017-12-21 LAB — CULTURE, BLOOD (ROUTINE X 2)
CULTURE: NO GROWTH
Culture: NO GROWTH
SPECIAL REQUESTS: ADEQUATE
Special Requests: ADEQUATE

## 2017-12-23 NOTE — Telephone Encounter (Signed)
Transition Care Management Follow-up Telephone Call   Date discharged? 12/19/17   How have you been since you were released from the hospital? Pt states he is doing alright   Do you understand why you were in the hospital? YES   Do you understand the discharge instructions? YES   Where were you discharged to? Home   Items Reviewed:  Medications reviewed: YES  Allergies reviewed: YES  Dietary changes reviewed: YES  Referrals reviewed: YES   Functional Questionnaire:   Activities of Daily Living (ADLs):   He states he are independent in the following: bathing and hygiene, feeding, continence, grooming, toileting and dressing States he does require assistance with the following: ambulation   Any transportation issues/concerns?: NO   Any patient concerns? NO   Confirmed importance and date/time of follow-up visits scheduled YES, appt 01/06/18  Provider Appointment booked with [name]  Confirmed with patient if condition begins to worsen call PCP or go to the ER.  Patient was given the office number and encouraged to call back with question or concerns.  : YES

## 2017-12-24 ENCOUNTER — Telehealth: Payer: Self-pay | Admitting: Internal Medicine

## 2017-12-24 MED ORDER — CILIDINIUM-CHLORDIAZEPOXIDE 2.5-5 MG PO CAPS: 1.0000 | ORAL_CAPSULE | Freq: Two times a day (BID) | ORAL | 5 refills | Status: DC

## 2017-12-24 MED ORDER — MIRTAZAPINE 15 MG PO TABS: 15.0000 mg | ORAL_TABLET | Freq: Every day | ORAL | 5 refills | Status: DC

## 2017-12-24 NOTE — Telephone Encounter (Signed)
Okay all including MiraLAX Thank you

## 2017-12-24 NOTE — Telephone Encounter (Signed)
Ok Thx 

## 2017-12-24 NOTE — Telephone Encounter (Signed)
Okay to send in miralax?

## 2017-12-24 NOTE — Telephone Encounter (Signed)
Copied from [place] [phone]. Topic: Quick Communication - Rx Refill/Question >> Dec 24, 2017 10:54 AM Oneta Rack wrote: Yetta Flock. RN with [facility] contact #[phone] called regarding the following:   Medication Request:  miralax powder 17 grams po 2x daily for constipation 15 day supply.  override is needed for for apixaban (ELIQUIS) 2.5 MG TABS tablet due to it being to early to fill (it was explained to the pharmacy medication was thrown away while patient was in the hospital therefore verbal order is needed).  clidinium-chlordiazePOXIDE (LIBRAX) 5-2.5 MG capsule (pharmacy declined receiving Rx 12/19/17)   Has the patient contacted their pharmacy? yes   (Agent: If yes, when and what did the pharmacy advise?)  contact PCP office due to medication being thrown away while patient was in the hospital  Preferred Pharmacy (with phone number or street name):   [place], [place] [address] Phone: [phone] Fax: [phone]  Agent: Please be advised that RX refills may take up to 3 business days. We ask that you follow-up with your pharmacy.

## 2017-12-25 MED ORDER — POLYETHYLENE GLYCOL 3350 17 GM/SCOOP PO POWD: 17.0000 g | Freq: Two times a day (BID) | ORAL | 3 refills | Status: AC | PRN

## 2017-12-25 NOTE — Telephone Encounter (Signed)
Done

## 2017-12-25 NOTE — Addendum Note (Signed)
Addended by: Karren Cobble on: 12/25/2017 11:09 AM   Modules accepted: Orders

## 2017-12-26 ENCOUNTER — Ambulatory Visit: Payer: Self-pay

## 2017-12-26 ENCOUNTER — Ambulatory Visit: Payer: Self-pay | Admitting: *Deleted

## 2017-12-27 ENCOUNTER — Inpatient Hospital Stay (HOSPITAL_COMMUNITY)
Admission: EM | Admit: 2017-12-27 | Discharge: 2018-01-02 | DRG: 871 | Disposition: A | Discharge status: Home / Self Care | Attending: Internal Medicine | Admitting: Internal Medicine

## 2017-12-27 ENCOUNTER — Encounter (HOSPITAL_COMMUNITY): Payer: Self-pay | Admitting: Emergency Medicine

## 2017-12-27 ENCOUNTER — Ambulatory Visit: Payer: Self-pay

## 2017-12-27 ENCOUNTER — Emergency Department (HOSPITAL_COMMUNITY)

## 2017-12-27 DIAGNOSIS — G9341 Metabolic encephalopathy: Secondary | ICD-10-CM | POA: Diagnosis not present

## 2017-12-27 DIAGNOSIS — H548 Legal blindness, as defined in USA: Secondary | ICD-10-CM | POA: Diagnosis present

## 2017-12-27 DIAGNOSIS — I482 Chronic atrial fibrillation, unspecified: Secondary | ICD-10-CM | POA: Diagnosis present

## 2017-12-27 DIAGNOSIS — I350 Nonrheumatic aortic (valve) stenosis: Secondary | ICD-10-CM | POA: Diagnosis not present

## 2017-12-27 DIAGNOSIS — H3530 Unspecified macular degeneration: Secondary | ICD-10-CM | POA: Diagnosis present

## 2017-12-27 DIAGNOSIS — Z961 Presence of intraocular lens: Secondary | ICD-10-CM | POA: Diagnosis present

## 2017-12-27 DIAGNOSIS — R000 Tachycardia, unspecified: Secondary | ICD-10-CM | POA: Diagnosis not present

## 2017-12-27 DIAGNOSIS — Z79899 Other long term (current) drug therapy: Secondary | ICD-10-CM

## 2017-12-27 DIAGNOSIS — Z888 Allergy status to other drugs, medicaments and biological substances status: Secondary | ICD-10-CM

## 2017-12-27 DIAGNOSIS — Z681 Body mass index (BMI) 19 or less, adult: Secondary | ICD-10-CM | POA: Diagnosis not present

## 2017-12-27 DIAGNOSIS — J449 Chronic obstructive pulmonary disease, unspecified: Secondary | ICD-10-CM | POA: Diagnosis not present

## 2017-12-27 DIAGNOSIS — K589 Irritable bowel syndrome without diarrhea: Secondary | ICD-10-CM | POA: Diagnosis present

## 2017-12-27 DIAGNOSIS — I5033 Acute on chronic diastolic (congestive) heart failure: Secondary | ICD-10-CM | POA: Diagnosis present

## 2017-12-27 DIAGNOSIS — Z7189 Other specified counseling: Secondary | ICD-10-CM | POA: Diagnosis not present

## 2017-12-27 DIAGNOSIS — F319 Bipolar disorder, unspecified: Secondary | ICD-10-CM | POA: Diagnosis present

## 2017-12-27 DIAGNOSIS — I472 Ventricular tachycardia: Secondary | ICD-10-CM | POA: Diagnosis present

## 2017-12-27 DIAGNOSIS — J42 Unspecified chronic bronchitis: Secondary | ICD-10-CM | POA: Diagnosis not present

## 2017-12-27 DIAGNOSIS — I480 Paroxysmal atrial fibrillation: Secondary | ICD-10-CM | POA: Diagnosis not present

## 2017-12-27 DIAGNOSIS — A419 Sepsis, unspecified organism: Principal | ICD-10-CM | POA: Diagnosis present

## 2017-12-27 DIAGNOSIS — J9601 Acute respiratory failure with hypoxia: Secondary | ICD-10-CM | POA: Diagnosis present

## 2017-12-27 DIAGNOSIS — J690 Pneumonitis due to inhalation of food and vomit: Secondary | ICD-10-CM | POA: Diagnosis not present

## 2017-12-27 DIAGNOSIS — D631 Anemia in chronic kidney disease: Secondary | ICD-10-CM | POA: Diagnosis present

## 2017-12-27 DIAGNOSIS — R609 Edema, unspecified: Secondary | ICD-10-CM | POA: Diagnosis not present

## 2017-12-27 DIAGNOSIS — J181 Lobar pneumonia, unspecified organism: Secondary | ICD-10-CM | POA: Diagnosis not present

## 2017-12-27 DIAGNOSIS — I2510 Atherosclerotic heart disease of native coronary artery without angina pectoris: Secondary | ICD-10-CM | POA: Diagnosis present

## 2017-12-27 DIAGNOSIS — Z8673 Personal history of transient ischemic attack (TIA), and cerebral infarction without residual deficits: Secondary | ICD-10-CM

## 2017-12-27 DIAGNOSIS — Z950 Presence of cardiac pacemaker: Secondary | ICD-10-CM

## 2017-12-27 DIAGNOSIS — M1990 Unspecified osteoarthritis, unspecified site: Secondary | ICD-10-CM | POA: Diagnosis present

## 2017-12-27 DIAGNOSIS — Z8501 Personal history of malignant neoplasm of esophagus: Secondary | ICD-10-CM

## 2017-12-27 DIAGNOSIS — Z808 Family history of malignant neoplasm of other organs or systems: Secondary | ICD-10-CM

## 2017-12-27 DIAGNOSIS — Z66 Do not resuscitate: Secondary | ICD-10-CM | POA: Diagnosis present

## 2017-12-27 DIAGNOSIS — F0390 Unspecified dementia without behavioral disturbance: Secondary | ICD-10-CM | POA: Diagnosis present

## 2017-12-27 DIAGNOSIS — I130 Hypertensive heart and chronic kidney disease with heart failure and stage 1 through stage 4 chronic kidney disease, or unspecified chronic kidney disease: Secondary | ICD-10-CM | POA: Diagnosis present

## 2017-12-27 DIAGNOSIS — Z87891 Personal history of nicotine dependence: Secondary | ICD-10-CM

## 2017-12-27 DIAGNOSIS — Z515 Encounter for palliative care: Secondary | ICD-10-CM | POA: Diagnosis present

## 2017-12-27 DIAGNOSIS — Z7901 Long term (current) use of anticoagulants: Secondary | ICD-10-CM

## 2017-12-27 DIAGNOSIS — I440 Atrioventricular block, first degree: Secondary | ICD-10-CM | POA: Diagnosis present

## 2017-12-27 DIAGNOSIS — J189 Pneumonia, unspecified organism: Secondary | ICD-10-CM | POA: Diagnosis present

## 2017-12-27 DIAGNOSIS — R0689 Other abnormalities of breathing: Secondary | ICD-10-CM | POA: Diagnosis not present

## 2017-12-27 DIAGNOSIS — K219 Gastro-esophageal reflux disease without esophagitis: Secondary | ICD-10-CM | POA: Diagnosis present

## 2017-12-27 DIAGNOSIS — Z8719 Personal history of other diseases of the digestive system: Secondary | ICD-10-CM

## 2017-12-27 DIAGNOSIS — N184 Chronic kidney disease, stage 4 (severe): Secondary | ICD-10-CM | POA: Diagnosis present

## 2017-12-27 DIAGNOSIS — I679 Cerebrovascular disease, unspecified: Secondary | ICD-10-CM | POA: Diagnosis not present

## 2017-12-27 DIAGNOSIS — E440 Moderate protein-calorie malnutrition: Secondary | ICD-10-CM | POA: Diagnosis present

## 2017-12-27 DIAGNOSIS — R6520 Severe sepsis without septic shock: Secondary | ICD-10-CM | POA: Diagnosis present

## 2017-12-27 DIAGNOSIS — Z9111 Patient's noncompliance with dietary regimen: Secondary | ICD-10-CM

## 2017-12-27 DIAGNOSIS — R627 Adult failure to thrive: Secondary | ICD-10-CM | POA: Diagnosis not present

## 2017-12-27 DIAGNOSIS — E43 Unspecified severe protein-calorie malnutrition: Secondary | ICD-10-CM | POA: Diagnosis not present

## 2017-12-27 DIAGNOSIS — J410 Simple chronic bronchitis: Secondary | ICD-10-CM | POA: Diagnosis not present

## 2017-12-27 DIAGNOSIS — L89152 Pressure ulcer of sacral region, stage 2: Secondary | ICD-10-CM | POA: Diagnosis present

## 2017-12-27 DIAGNOSIS — I639 Cerebral infarction, unspecified: Secondary | ICD-10-CM | POA: Diagnosis not present

## 2017-12-27 DIAGNOSIS — R05 Cough: Secondary | ICD-10-CM | POA: Diagnosis not present

## 2017-12-27 DIAGNOSIS — E785 Hyperlipidemia, unspecified: Secondary | ICD-10-CM | POA: Diagnosis present

## 2017-12-27 DIAGNOSIS — Z85810 Personal history of malignant neoplasm of tongue: Secondary | ICD-10-CM

## 2017-12-27 DIAGNOSIS — I672 Cerebral atherosclerosis: Secondary | ICD-10-CM | POA: Diagnosis not present

## 2017-12-27 DIAGNOSIS — R1310 Dysphagia, unspecified: Secondary | ICD-10-CM | POA: Diagnosis not present

## 2017-12-27 DIAGNOSIS — Z9842 Cataract extraction status, left eye: Secondary | ICD-10-CM

## 2017-12-27 DIAGNOSIS — Z8249 Family history of ischemic heart disease and other diseases of the circulatory system: Secondary | ICD-10-CM

## 2017-12-27 DIAGNOSIS — R0602 Shortness of breath: Secondary | ICD-10-CM | POA: Diagnosis not present

## 2017-12-27 LAB — CBC WITH DIFFERENTIAL/PLATELET
Abs Immature Granulocytes: 0.16 10*3/uL — ABNORMAL HIGH (ref 0.00–0.07)
Basophils Absolute: 0 10*3/uL (ref 0.0–0.1)
Basophils Relative: 0 %
EOS ABS: 0.1 10*3/uL (ref 0.0–0.5)
Eosinophils Relative: 1 %
HEMATOCRIT: 35 % — AB (ref 39.0–52.0)
Hemoglobin: 10.8 g/dL — ABNORMAL LOW (ref 13.0–17.0)
Immature Granulocytes: 1 %
LYMPHS ABS: 1.9 10*3/uL (ref 0.7–4.0)
Lymphocytes Relative: 8 %
MCH: 28.1 pg (ref 26.0–34.0)
MCHC: 30.9 g/dL (ref 30.0–36.0)
MCV: 91.1 fL (ref 80.0–100.0)
MONOS PCT: 4 %
Monocytes Absolute: 0.8 10*3/uL (ref 0.1–1.0)
Neutro Abs: 19 10*3/uL — ABNORMAL HIGH (ref 1.7–7.7)
Neutrophils Relative %: 86 %
Platelets: 308 10*3/uL (ref 150–400)
RBC: 3.84 MIL/uL — ABNORMAL LOW (ref 4.22–5.81)
RDW: 15.2 % (ref 11.5–15.5)
WBC: 22 10*3/uL — ABNORMAL HIGH (ref 4.0–10.5)
nRBC: 0 % (ref 0.0–0.2)

## 2017-12-27 LAB — I-STAT VENOUS BLOOD GAS, ED
Acid-Base Excess: 1 mmol/L (ref 0.0–2.0)
Bicarbonate: 26.8 mmol/L (ref 20.0–28.0)
O2 Saturation: 42 %
PH VEN: 7.39 (ref 7.250–7.430)
TCO2: 28 mmol/L (ref 22–32)
pCO2, Ven: 44.3 mmHg (ref 44.0–60.0)
pO2, Ven: 24 mmHg — CL (ref 32.0–45.0)

## 2017-12-27 LAB — I-STAT CHEM 8, ED
BUN: 15 mg/dL (ref 8–23)
Calcium, Ion: 1.06 mmol/L — ABNORMAL LOW (ref 1.15–1.40)
Chloride: 103 mmol/L (ref 98–111)
Creatinine, Ser: 1.2 mg/dL (ref 0.61–1.24)
Glucose, Bld: 96 mg/dL (ref 70–99)
HCT: 34 % — ABNORMAL LOW (ref 39.0–52.0)
Hemoglobin: 11.6 g/dL — ABNORMAL LOW (ref 13.0–17.0)
POTASSIUM: 4 mmol/L (ref 3.5–5.1)
Sodium: 138 mmol/L (ref 135–145)
TCO2: 26 mmol/L (ref 22–32)

## 2017-12-27 LAB — URINALYSIS, ROUTINE W REFLEX MICROSCOPIC
Bilirubin Urine: NEGATIVE
Glucose, UA: NEGATIVE mg/dL
Hgb urine dipstick: NEGATIVE
KETONES UR: NEGATIVE mg/dL
Leukocytes, UA: NEGATIVE
Nitrite: NEGATIVE
PH: 5 (ref 5.0–8.0)
Protein, ur: NEGATIVE mg/dL
Specific Gravity, Urine: 1.012 (ref 1.005–1.030)

## 2017-12-27 LAB — COMPREHENSIVE METABOLIC PANEL
ALBUMIN: 1.9 g/dL — AB (ref 3.5–5.0)
ALT: 30 U/L (ref 0–44)
ANION GAP: 17 — AB (ref 5–15)
AST: 48 U/L — ABNORMAL HIGH (ref 15–41)
Alkaline Phosphatase: 83 U/L (ref 38–126)
BUN: 13 mg/dL (ref 8–23)
CO2: 23 mmol/L (ref 22–32)
Calcium: 8.5 mg/dL — ABNORMAL LOW (ref 8.9–10.3)
Chloride: 101 mmol/L (ref 98–111)
Creatinine, Ser: 1.43 mg/dL — ABNORMAL HIGH (ref 0.61–1.24)
GFR calc non Af Amer: 45 mL/min — ABNORMAL LOW (ref >60)
GFR, EST AFRICAN AMERICAN: 52 mL/min — AB (ref >60)
GLUCOSE: 103 mg/dL — AB (ref 70–99)
Potassium: 4.1 mmol/L (ref 3.5–5.1)
SODIUM: 141 mmol/L (ref 135–145)
Total Bilirubin: 0.7 mg/dL (ref 0.3–1.2)
Total Protein: 6.6 g/dL (ref 6.5–8.1)

## 2017-12-27 LAB — MAGNESIUM: Magnesium: 2 mg/dL (ref 1.7–2.4)

## 2017-12-27 LAB — BRAIN NATRIURETIC PEPTIDE: B Natriuretic Peptide: 466.3 pg/mL — ABNORMAL HIGH (ref 0.0–100.0)

## 2017-12-27 LAB — I-STAT TROPONIN, ED: Troponin i, poc: 0.04 ng/mL (ref 0.00–0.08)

## 2017-12-27 LAB — LACTIC ACID, PLASMA: Lactic Acid, Venous: 0.8 mmol/L (ref 0.5–1.9)

## 2017-12-27 LAB — I-STAT CG4 LACTIC ACID, ED
LACTIC ACID, VENOUS: 2.38 mmol/L — AB (ref 0.5–1.9)
Lactic Acid, Venous: 6.44 mmol/L (ref 0.5–1.9)

## 2017-12-27 LAB — STREP PNEUMONIAE URINARY ANTIGEN: Strep Pneumo Urinary Antigen: NEGATIVE

## 2017-12-27 MED ORDER — SODIUM CHLORIDE 0.9 % IV SOLN
2.0000 g | Freq: Once | INTRAVENOUS | Status: AC
  Administered 2017-12-27: 2 g via INTRAVENOUS
  Filled 2017-12-27: qty 2

## 2017-12-27 MED ORDER — SODIUM CHLORIDE 0.9 % IV BOLUS (SEPSIS)
1000.0000 mL | Freq: Once | INTRAVENOUS | Status: AC
  Administered 2017-12-27: 1000 mL via INTRAVENOUS

## 2017-12-27 MED ORDER — SODIUM CHLORIDE 0.9 % IV BOLUS
1000.0000 mL | Freq: Once | INTRAVENOUS | Status: AC
  Administered 2017-12-27: 1000 mL via INTRAVENOUS

## 2017-12-27 MED ORDER — ATORVASTATIN CALCIUM 10 MG PO TABS
20.0000 mg | ORAL_TABLET | Freq: Every day | ORAL | Status: DC
  Administered 2017-12-28 – 2018-01-01 (×4): 20 mg via ORAL
  Filled 2017-12-27 (×4): qty 2

## 2017-12-27 MED ORDER — HYDROCORTISONE NA SUCCINATE PF 100 MG IJ SOLR
100.0000 mg | Freq: Three times a day (TID) | INTRAMUSCULAR | Status: DC
  Administered 2017-12-27 – 2017-12-29 (×5): 100 mg via INTRAVENOUS
  Filled 2017-12-27 (×5): qty 2

## 2017-12-27 MED ORDER — SODIUM CHLORIDE 0.9% FLUSH
3.0000 mL | Freq: Two times a day (BID) | INTRAVENOUS | Status: DC
  Administered 2017-12-27 – 2017-12-31 (×5): 3 mL via INTRAVENOUS

## 2017-12-27 MED ORDER — SODIUM CHLORIDE 0.9 % IV BOLUS
500.0000 mL | Freq: Once | INTRAVENOUS | Status: AC
  Administered 2017-12-27: 500 mL via INTRAVENOUS

## 2017-12-27 MED ORDER — FAMOTIDINE IN NACL 20-0.9 MG/50ML-% IV SOLN
20.0000 mg | Freq: Two times a day (BID) | INTRAVENOUS | Status: DC
  Administered 2017-12-27 – 2018-01-02 (×12): 20 mg via INTRAVENOUS
  Filled 2017-12-27 (×15): qty 50

## 2017-12-27 MED ORDER — MAGNESIUM SULFATE 2 GM/50ML IV SOLN
2.0000 g | Freq: Once | INTRAVENOUS | Status: AC
  Administered 2017-12-27: 2 g via INTRAVENOUS
  Filled 2017-12-27: qty 50

## 2017-12-27 MED ORDER — VANCOMYCIN HCL 10 G IV SOLR
1750.0000 mg | INTRAVENOUS | Status: DC
  Administered 2017-12-29: 1750 mg via INTRAVENOUS
  Filled 2017-12-27 (×2): qty 1750

## 2017-12-27 MED ORDER — ACETAMINOPHEN 650 MG RE SUPP
650.0000 mg | Freq: Once | RECTAL | Status: AC
  Administered 2017-12-27: 650 mg via RECTAL
  Filled 2017-12-27: qty 1

## 2017-12-27 MED ORDER — LACTATED RINGERS IV BOLUS
1000.0000 mL | Freq: Once | INTRAVENOUS | Status: AC
  Administered 2017-12-27: 1000 mL via INTRAVENOUS

## 2017-12-27 MED ORDER — IPRATROPIUM BROMIDE 0.02 % IN SOLN
0.5000 mg | Freq: Three times a day (TID) | RESPIRATORY_TRACT | Status: DC
  Administered 2017-12-27 – 2017-12-30 (×9): 0.5 mg via RESPIRATORY_TRACT
  Filled 2017-12-27 (×11): qty 2.5

## 2017-12-27 MED ORDER — HEPARIN SODIUM (PORCINE) 5000 UNIT/ML IJ SOLN
5000.0000 [IU] | Freq: Three times a day (TID) | INTRAMUSCULAR | Status: DC
  Administered 2017-12-27 – 2017-12-28 (×2): 5000 [IU] via SUBCUTANEOUS
  Filled 2017-12-27 (×2): qty 1

## 2017-12-27 MED ORDER — ONDANSETRON HCL 4 MG/2ML IJ SOLN: 4.0000 mg | Freq: Four times a day (QID) | INTRAMUSCULAR | Status: DC | PRN

## 2017-12-27 MED ORDER — VANCOMYCIN HCL 10 G IV SOLR
1500.0000 mg | Freq: Once | INTRAVENOUS | Status: AC
  Administered 2017-12-27: 1500 mg via INTRAVENOUS
  Filled 2017-12-27 (×2): qty 1500

## 2017-12-27 MED ORDER — LACTATED RINGERS IV SOLN
INTRAVENOUS | Status: DC
  Administered 2017-12-27 – 2017-12-31 (×6): via INTRAVENOUS

## 2017-12-27 MED ORDER — SODIUM CHLORIDE 0.9 % IV SOLN
2.0000 g | INTRAVENOUS | Status: DC
  Administered 2017-12-27 – 2017-12-30 (×3): 2 g via INTRAVENOUS
  Filled 2017-12-27 (×3): qty 2

## 2017-12-27 MED ORDER — SODIUM CHLORIDE 0.9 % IV SOLN
500.0000 mg | INTRAVENOUS | Status: DC
  Administered 2017-12-27 – 2017-12-30 (×4): 500 mg via INTRAVENOUS
  Filled 2017-12-27 (×4): qty 500

## 2017-12-27 MED ORDER — FLUTICASONE PROPIONATE 50 MCG/ACT NA SUSP: 2.0000 | Freq: Every day | NASAL | Status: DC | PRN

## 2017-12-27 NOTE — ED Triage Notes (Signed)
Pt to ER for re-evaluation of productive cough after being diagnosed and treated for pneumonia for the past 2 weeks, finished antibiotics 2 days ago. Pt a/o x4. Also noted to have lower leg swelling.

## 2017-12-27 NOTE — Telephone Encounter (Signed)
Received call from [facility] at [facility] who was at the pt's home at the time of the call. Pt with h/o aspiration pneumonia has h/o fever and frequent congested cough. Pt was eating pancakes Tuesday and ever since he has had a congested cough. Pt was febrile Wednesday night to 101.7. Pt having episodes where he is having chills then he says he is hot. Temperature checked during call and it was 96 degrees. Helene Kelp noted his lungs were clear but diminished to the right lower lobe of his lungs. Pt is unable to cough up his secretions from his throat. Pt denies wheezing or SOB. Pt also has 3 day history of bilateral leg edema. The edema starts at feet and extends to thighs. Helene Kelp  stated he has 1+ edema to both legs. Pt denies pain or redness with the except for both feet. Per Helene Kelp there are numerous sores to feet,heels and toes but is preexisting. Nurse is concerned that it could be heart problem. Pt has a h/o atrial fibrillation, CVA and TIA's.  Disposition was to send pt to [facility]. Called PCP office and spoke with Gareth Eagle who recommended the pt go to the ED instead. Helene Kelp spoke with family and after discussing disposition, they agree to take pt to the ED for evaluation.  Reason for Disposition . [1] Ankle swelling AND [2] swelling is increasing    Pt has edema from bilateral feet to thighs x 3 days . SEVERE leg swelling (e.g., swelling extends above knee, entire leg is swollen, weeping fluid)    Swelling goes from feet to thighs bilaterally  PCP recommended ED disposition  Answer Assessment - Initial Assessment Questions 1. ONSET: "When did the cough begin?"      Tuesday 2. SEVERITY: "How bad is the cough today?"      Frequent cough that is worse at night 3. RESPIRATORY DISTRESS: "Describe your breathing."       4. FEVER: "Do you have a fever?" If so, ask: "What is your temperature, how was it measured, and when did it start?"     Having chills alternating with  feeling hot; Had a fever Wednesday night to  101.7 5. HEMOPTYSIS: "Are you coughing up any blood?" If so ask: "How much?" (flecks, streaks, tablespoons, etc.)     Do not know- pt unable to bring up secretions 6. TREATMENT: "What have you done so far to treat the cough?" (e.g., meds, fluids, humidifier)     Nebulizer 3 times a day 7. CARDIAC HISTORY: "Do you have any history of heart disease?" (e.g., heart attack, congestive heart failure)      CAD, afib 8. LUNG HISTORY: "Do you have any history of lung disease?"  (e.g., pulmonary embolus, asthma, emphysema)     COPD 9. PE RISK FACTORS: "Do you have a history of blood clots?" (or: recent major surgery, recent prolonged travel, bedridden)     H/o CVA and TIA 10. OTHER SYMPTOMS: "Do you have any other symptoms? (e.g., runny nose, wheezing, chest pain)       diminished right lower base of the lungs 11. PREGNANCY: "Is there any chance you are pregnant?" "When was your last menstrual period?"       n/a 12. TRAVEL: "Have you traveled out of the country in the last month?" (e.g., travel history, exposures)       no  Answer Assessment - Initial Assessment Questions 1. ONSET: "When did the swelling start?" (e.g., minutes, hours, days)  3 dsyas 2. LOCATION: "What part of the leg is swollen?"  "Are both legs swollen or just one leg?"     Both legs are swollen from feet to thighs 3. SEVERITY: "How bad is the swelling?" (e.g., localized; mild, moderate, severe)  - Localized - small area of swelling localized to one leg  - MILD pedal edema - swelling limited to foot and ankle, pitting edema < 1/4 inch (6 mm) deep, rest and elevation eliminate most or all swelling  - MODERATE edema - swelling of lower leg to knee, pitting edema > 1/4 inch (6 mm) deep, rest and elevation only partially reduce swelling  - SEVERE edema - swelling extends above knee, facial or hand swelling present      severe 4. REDNESS: "Does the swelling look red or infected?"      There are multiple wounds to feet heels toes bilateral feet- but is preexisiting to the onset of the edema 5. PAIN: "Is the swelling painful to touch?" If so, ask: "How painful is it?"   (Scale 1-10; mild, moderate or severe)     no 6. FEVER: "Do you have a fever?" If so, ask: "What is it, how was it measured, and when did it start?"      no 7. CAUSE: "What do you think is causing the leg swelling?"     Heart disease 8. MEDICAL HISTORY: "Do you have a history of heart failure, kidney disease, liver failure, or cancer?" A fib, CVA, TIA, chronic kidney disease,COPD, h/o aspiration pneumonia 9. RECURRENT SYMPTOM: "Have you had leg swelling before?" If so, ask: "When was the last time?" "What happened that time?"     no 10. OTHER SYMPTOMS: "Do you have any other symptoms?" (e.g., chest pain, difficulty breathing)       no 11. PREGNANCY: "Is there any chance you are pregnant?" "When was your last menstrual period?"       n/a  Protocols used: COUGH - ACUTE NON-PRODUCTIVE-A-AH, LEG SWELLING AND EDEMA-A-AH

## 2017-12-27 NOTE — H&P (Signed)
History and Physical    CHICK COUSINS DXI:[id number] DOB: [birth year] DOA: 12/27/2017  PCP: Cassandria Anger, MD   Patient coming from: Home  Chief Complaint:   HPI: Chad [name] is a 84 y.o. male with medical history significant of COPD/bronchiectasis, CAD, history of stroke, failure to thrive, recurrent aspiration pneumonia, atrial fibrillation on Eliquis, who is currently enrolled in home hospice, receiving twice a week visits by hospice RN.  Patient  is a DNR but patient/family wishing full scope of treatment.  Patient had recently finished course of antibiotic 3 days ago for aspiration pneumonia however 2 days back he started coughing after eating.  He has been coughing ever since.  No fever noted at home.  Patient also having worsening leg swelling right greater than left.  He is having productive cough.  Patient was brought to the ER for further evaluation. He has has frequent hospitalization due to pneumonia, recently enrolled in hospice at home. He was recently admitted to ICU on 11/25/2017 for respiratory failure and sepsis.  ED Course: Initially blood pressure soft 90/55, tachycardic in 130s, fever of 100.5, saturating 83% room air, switched to [place] and pulse ox did well in 100%.  Blood work showed leukocytosis of 22,000, BNP 466, creatinine 1.4, lactic acid of 6.4.  Blood cultures were sent, x-ray was obtained that showed hazy bilateral lower lobe airspace disease likely reflecting residual pneumonia. Lactic acid after 2 l bolus was 2.3, and patient was still finishing the bolus saline.Patient was also given vancomycin, cefepime and azithromycin.  Critical care was consulted, who advised patient is stable for admission to medical floor and if patient has hemodynamic instability, critical care could be consulted.When I spoke w pt's son, he states pressors/central line is okay if needed.  Review of Systems: All systems are reviewed and were negative except as mentioned in HPI  above   Past Medical History:  Diagnosis Date  . Arthritis    "hands" (01/12/2013); feet (podiatry consultation); "back" (06/12/2017)  . Atrial flutter ([place])    a. Slow ~100bpm when in 2:1; dx 04/2012. b. Placed on Xarelto.  c. s/p ablation 07-08-2012 by Dr Rayann Heman  . Carotid artery calcification    By CXR - dopplers 05/06/12 without obvious evidence of significant ICA stenosis >40%  . Colon polyps 06/26/2005  . COPD (chronic obstructive pulmonary disease) ([place])   . Diverticulosis of colon (without mention of hemorrhage)   . Gastritis, chronic    Pt denies bleeding 04/2012. EGD 2012 reportedly normal.  . GERD (gastroesophageal reflux disease)   . Hypertension   . Hyponatremia    04/2012 r/t diuretic.  . IBS (irritable bowel syndrome)   . Internal and external hemorrhoids without complication   . Macular degeneration of left eye   . Pneumonia    "@ least 5 times" (06/12/2017)  . Stroke ([place])   . Tongue cancer ([place]) 02/1997    Past Surgical History:  Procedure Laterality Date  . ATRIAL FLUTTER ABLATION N/A 07/08/2012   Procedure: ATRIAL FLUTTER ABLATION;  Surgeon: Thompson Grayer, MD;  Location: [facility] CATH LAB;  Service: Cardiovascular;  Laterality: N/A;  . CARDIAC CATHETERIZATION  04/2012  . CATARACT EXTRACTION W/ INTRAOCULAR LENS IMPLANT Left   . COLONOSCOPY  08/29/10   diverticulosis, internal hemorrhoids  . ESOPHAGOGASTRODUODENOSCOPY  09/20/10   normal  . [place]  . LEFT HEART CATHETERIZATION WITH CORONARY ANGIOGRAM N/A 05/06/2012   Procedure: LEFT HEART CATHETERIZATION WITH CORONARY ANGIOGRAM;  Surgeon: Peter M Martinique, MD;  Location: [facility] CATH LAB;  Service: Cardiovascular;  Laterality: N/A;  . 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 PACEMAKER IMPLANT N/A 08/01/2017   Procedure: PACEMAKER IMPLANT;  Surgeon: Constance Haw, MD;  Location: [facility] CV LAB;  Service: Cardiovascular;  Laterality: N/A;  . TRIGGER FINGER RELEASE Right 1970's   "2 fingers"  .  WART FULGURATION Left 09/15/2015   Procedure: EXCISIONal biospy of left peri anual and anual canal mass;  Surgeon: Greer Pickerel, MD;  Location: [facility] ORS;  Service: General;  Laterality: Left;     reports that he quit smoking about 21 years ago. His smoking use included cigarettes, pipe, and cigars. He has a 10.00 pack-year smoking history. He has never used smokeless tobacco. He reports that he does not drink alcohol or use drugs.  Allergies  Allergen Reactions  . Dyazide [Hydrochlorothiazide W-Triamterene] Other (See Comments)    Caused hyponatremia 04/2012  . Lisinopril Cough    Family History  Problem Relation Age of Onset  . Heart disease Brother   . Throat cancer Brother   . Other Mother        UNSURE  . Other Father        UNSURE  . Emphysema Sister   . Emphysema Sister   . Other Brother        UNSURE  . Diabetes Other      Prior to Admission medications   Medication Sig Start Date End Date Taking? Authorizing Provider  Amino Acids-Protein Hydrolys (FEEDING SUPPLEMENT, PRO-STAT SUGAR FREE 64,) LIQD Take 30 mLs by mouth 2 (two) times daily. 12/19/17   Thurnell Lose, MD  amoxicillin-clavulanate (AUGMENTIN) 500-125 MG tablet Take 1 tablet (500 mg total) by mouth 3 (three) times daily. 12/19/17   Thurnell Lose, MD  apixaban (ELIQUIS) 2.5 MG TABS tablet Take 1 tablet (2.5 mg total) by mouth 2 (two) times daily. Resume 7/28/19PM 12/20/17   Plotnikov, Evie Lacks, MD  atorvastatin (LIPITOR) 40 MG tablet Take 1 tablet (40 mg total) by mouth daily at 6 PM. Patient taking differently: Take 20 mg by mouth daily at 6 PM.  08/21/17   Plotnikov, Evie Lacks, MD  cholecalciferol (VITAMIN D) 1000 units tablet Take 1 tablet (1,000 Units total) by mouth daily. 10/15/16   Plotnikov, Evie Lacks, MD  clidinium-chlordiazePOXIDE (LIBRAX) 5-2.5 MG capsule Take 1 capsule by mouth 2 (two) times daily. 12/24/17   Plotnikov, Evie Lacks, MD  cyanocobalamin 1000 MCG tablet Take 1,000 mcg by mouth daily.      [provider]  escitalopram (LEXAPRO) 10 MG tablet Take 1 tablet (10 mg total) by mouth every morning. 12/20/17   Plotnikov, Evie Lacks, MD  famotidine (PEPCID) 20 MG tablet Take 1 tablet (20 mg total) by mouth at bedtime. 12/20/17   Plotnikov, Evie Lacks, MD  feeding supplement, ENSURE ENLIVE, (ENSURE ENLIVE) LIQD Take 237 mLs by mouth 2 (two) times daily between meals. Patient taking differently: Take 237 mLs by mouth every other day.  11/27/17   Lavina Hamman, MD  fluticasone Asencion Islam) 50 MCG/ACT nasal spray Place 2 sprays into both nostrils daily as needed for allergies or rhinitis. 12/20/17   Plotnikov, Evie Lacks, MD  gabapentin (NEURONTIN) 300 MG capsule Take 3 capsules at bedtime. 12/20/17   Plotnikov, Evie Lacks, MD  ipratropium (ATROVENT) 0.02 % nebulizer solution Take 2.5 mLs (0.5 mg total) by nebulization 3 (three) times daily. [facility] 12/20/17   Plotnikov, Evie Lacks, MD  Maltodextrin-Xanthan Gum (RESOURCE  THICKENUP CLEAR) POWD Take 1 g by mouth as needed. Patient taking differently: Take by mouth See admin instructions. Mix/use in all liquids- sodas, water, etc.. 11/27/17   Lavina Hamman, MD  mirtazapine (REMERON) 15 MG tablet Take 1 tablet (15 mg total) by mouth at bedtime. 12/24/17   Plotnikov, Evie Lacks, MD  multivitamin-lutein (OCUVITE-LUTEIN) CAPS capsule Take 1 capsule by mouth daily.     [provider]  pantoprazole (PROTONIX) 40 MG tablet Take 1 tablet (40 mg total) by mouth daily. Take 30-60 min before first meal of the day 12/20/17   Plotnikov, Evie Lacks, MD  polyethylene glycol powder (GLYCOLAX/MIRALAX) powder Take 17 g by mouth 2 (two) times daily as needed for mild constipation. 12/25/17   Plotnikov, Evie Lacks, MD  senna-docusate (SENOKOT-S) 8.6-50 MG tablet Take 1 tablet by mouth daily. 12/20/17   Plotnikov, Evie Lacks, MD  tetrahydrozoline 0.05 % ophthalmic solution Place 1 drop into both eyes as needed (dry eyes).    [provider]     Physical Exam: Vitals:   12/27/17 1349 12/27/17 1527 12/27/17 1615 12/27/17 1715  BP:  (!) 94/42 (!) 106/47 (!) 90/55  Pulse: (!) 135 (!) 112 (!) 102 (!) 47  Resp: (!) 22 (!) 35 20 15  Temp: (!) 100.5 F (38.1 C)     TempSrc: Rectal     SpO2: (!) 83% 100% 100% 100%    Constitutional: NAD, calm, comfortable Vitals:   12/27/17 1349 12/27/17 1527 12/27/17 1615 12/27/17 1715  BP:  (!) 94/42 (!) 106/47 (!) 90/55  Pulse: (!) 135 (!) 112 (!) 102 (!) 47  Resp: (!) 22 (!) 35 20 15  Temp: (!) 100.5 F (38.1 C)     TempSrc: Rectal     SpO2: (!) 83% 100% 100% 100%   General: Frail, thin, on 3l [place]. Chronically sick looking. Eyes: PERRL, lids and conjunctivae normal ENMT: Mucous membranes are dry.Normal dentition.  Neck: normal, supple, no masses, no thyromegaly Respiratory: clear to auscultation bilaterally, diminished at the base,no wheezing, no crackles.Normal respiratory effort. No accessory muscle use.  Cardiovascular: Regular rate and rhythm, no murmurs.  Bilateral pitting edema present both lower extremities.2+ pedal pulses. No carotid bruits.  Abdomen: no tenderness, no masses palpated. No hepatosplenomegaly. Bowel sounds positive.  Musculoskeletal: thin, no clubbing / cyanosis. No joint deformity upper and lower extremities. Good ROM. Skin: no rashes, lesions, ulcers. No induration Neurologic: CN 2-12 grossly intact. Lethargic, but wakes and interacts, weakness on left side from prior stroke. Psychiatric: Normal judgment and insight. Alert and oriented x 3. Normal mood.    Labs on Admission: I have personally reviewed following labs and imaging studies  CBC: Recent Labs  Lab 12/27/17 1356 12/27/17 1431  WBC 22.0*  --   NEUTROABS 19.0*  --   HGB 10.8* 11.6*  HCT 35.0* 34.0*  MCV 91.1  --   PLT 308  --    Basic Metabolic Panel: Recent Labs  Lab 12/27/17 1356 12/27/17 1431 12/27/17 1641  NA 141 138  --   K 4.1 4.0  --   CL 101 103  --   CO2 23  --   --    GLUCOSE 103* 96  --   BUN 13 15  --   CREATININE 1.43* 1.20  --   CALCIUM 8.5*  --   --   MG  --   --  2.0   GFR: Estimated Creatinine Clearance: 47.3 mL/min (by C-G formula based on SCr of 1.2  mg/dL). Liver Function Tests: Recent Labs  Lab 12/27/17 1356  AST 48*  ALT 30  ALKPHOS 83  BILITOT 0.7  PROT 6.6  ALBUMIN 1.9*   No results for input(s): LIPASE, AMYLASE in the last 168 hours. No results for input(s): AMMONIA in the last 168 hours. Coagulation Profile: No results for input(s): INR, PROTIME in the last 168 hours. Cardiac Enzymes: No results for input(s): CKTOTAL, CKMB, CKMBINDEX, TROPONINI in the last 168 hours. BNP (last 3 results) Recent Labs    04/24/17 1450  PROBNP 246.0*   HbA1C: No results for input(s): HGBA1C in the last 72 hours. CBG: No results for input(s): GLUCAP in the last 168 hours. Lipid Profile: No results for input(s): CHOL, HDL, LDLCALC, TRIG, CHOLHDL, LDLDIRECT in the last 72 hours. Thyroid Function Tests: No results for input(s): TSH, T4TOTAL, FREET4, T3FREE, THYROIDAB in the last 72 hours. Anemia Panel: No results for input(s): VITAMINB12, FOLATE, FERRITIN, TIBC, IRON, RETICCTPCT in the last 72 hours. Urine analysis:    Component Value Date/Time   COLORURINE YELLOW 12/17/2017 0445   APPEARANCEUR HAZY (A) 12/17/2017 0445   LABSPEC 1.019 12/17/2017 0445   PHURINE 5.0 12/17/2017 0445   GLUCOSEU NEGATIVE 12/17/2017 0445   GLUCOSEU NEGATIVE 04/24/2016 0728   HGBUR NEGATIVE 12/17/2017 0445   BILIRUBINUR NEGATIVE 12/17/2017 0445   BILIRUBINUR negative 05/31/2015 0910   BILIRUBINUR Negative 04/28/2014 1516   KETONESUR NEGATIVE 12/17/2017 0445   PROTEINUR NEGATIVE 12/17/2017 0445   UROBILINOGEN 0.2 04/24/2016 0728   NITRITE POSITIVE (A) 12/17/2017 0445   LEUKOCYTESUR SMALL (A) 12/17/2017 0445    Radiological Exams on Admission: Dg Chest Port 1 View  Result Date: 12/27/2017 CLINICAL DATA:  Shortness of breath with dry cough EXAM:  PORTABLE CHEST 1 VIEW COMPARISON:  12/16/2017 FINDINGS: There is bilateral lower lobe airspace disease. There is no pleural effusion or pneumothorax. The heart mediastinum are stable. There is a dual lead cardiac pacemaker. The osseous structures are unremarkable. IMPRESSION: Persistent hazy bilateral lower lobe airspace disease likely reflecting residual pneumonia. Electronically Signed   By: Kathreen Devoid   On: 12/27/2017 14:29    Assessment/Plan  Severe Sepsis from Pneumonia POA: Suspecting aspiration. Initial lactic acidosis of 6.4 improved to 2.3 will bolus normal saline 2 L.,  Currently 89/50 with map in high 50s to 60s, getting  another half litre nss. Patient is mentating well, on 3 L nasal cannula. Continue broad spectrum antibiotics with  vancomycin cefepime and azithromycin.  Patient is perfusing, straight cath yielded almost 900 ml urine.  I discussed with intensivist Dr. Lavera Guise to give another liter bolus Ringer's lactate his last EF was 55 to 60% in May 2017, his lungs are clear.  If Blood pressure remains low despite another liter he may need pressors and can be given via peripheral and critical needs to be contacted again, and also advised on starting stress dose hydrocortisone.  Discussed with the nursing staff.Night Team.  Aspiration pneumonia: keep NPO,consult speech for swallow eval.  Keep aspiration precaution, and add nebs.  Case has been discussed by ER physician with  intensivist and deemed stable for floor for now.  Follow up blood cultures, obtain a strep antigen and Legionella antigen. I had intensive discussion with patient and patient's son at the bedside.  Understand that patient is at risk for decompensation.  They are in agreement with current plan of care, the son is requesting that if patient needs central line and pressors he is agreeable with that, but they would like to  keep DNR DNI. Patient had been on  soft diet with honey thick liquids before as per   Speech note on 12/10 patient does not like the thickened liquids and will not drink them, noted to have severe aspiration risk.  This is his third episode of aspiration.  Noted that he was on comfort feeds on previous discharge. Family does not seem very clear on overall goals as patient is doing comfort feeding and now coming in with pneumonia, consult palliative care.  Acute hypoxic respiratory failure needing low-flow nasal cannula from aspiration pneumonia COPD.  Continue supplemental oxygen saturation at least 92%.  COPD  with chronic bronchitis/bronchiectasis:  History of CVA (cerebrovascular accident)-May 2019. Resume eliquis and Statins.  Chronic kidney disease, stage IV, baseline creatinine ~ 1.3-1.7.  Creatinine is 1.2.  Monitor closely.  Nonsustained V. Tach: Mag and potassium stable.  Monitor in telemetry.   FTT  in adult : Appears frail and deconditioned. Obtain Nutritional eval.  Paroxysmal atrial fibrillation on Eliquis, not been taking for few days.  Resume once abel to take PO.   Severity of Illness: The appropriate patient status for this patient is INPATIENT. Inpatient status is judged to be reasonable and necessary in order to provide the required intensity of service to ensure the patient's safety. The patient's presenting symptoms, physical exam findings, and initial radiographic and laboratory data in the context of their chronic comorbidities is felt to place them at high risk for further clinical deterioration. Furthermore, it is not anticipated that the patient will be medically stable for discharge from the hospital within 2 midnights of admission. The following factors support the patient status of inpatient.   * I certify that at the point of admission it is my clinical judgment that the patient will require inpatient hospital care spanning beyond 2 midnights from the point of admission due to high intensity of service, high risk for further deterioration and high  frequency of surveillance required.*  DVT prophylaxis:  Heparin sq for now. And change to eliquis once taking PO Code Status:DNR  Family Communication:  SON AT BEDSIDE Consults called: cardiology, PCCM.   Antonieta Pert MD [facility] Pager [phone]  If 7PM-7AM, please contact night-coverage [email or web address] Password [facility]  12/27/2017, 5:23 PM

## 2017-12-27 NOTE — ED Notes (Signed)
Attempted to call report, RN will return call 

## 2017-12-27 NOTE — Consult Note (Signed)
Cardiology Consultation:   Patient ID: Chad [name] MRN: [id number]; DOB: 1931-02-19  Admit date: 12/27/2017 Date of Consult: 12/27/2017  Primary Care Provider: Cassandria Anger, MD Primary Cardiologist: Kirk Ruths, MD  Primary Electrophysiologist:  Constance Haw, MD    Patient Profile:   Chad [name] is a 84 y.o. male with a hx of paroxysmal atrial fibrillation, SSS, prior CVA, hypertension, COPD, recurrent aspiration pneumonia, and GERD who is being seen today for the evaluation of wide complex tachycardia at the request of Dr. Maren Beach.  History of Present Illness:   Chad [name] is currently enrolled in home hospice but currently wants full care.  He recently received antibiotics for recurrent aspiration pneumonia and recnetly started coughing again.  The cough is productive and he was brought to the ED for evaluation.  In the ED he was noted to be hypotensive to 90/55.  He had a fever of 100.5 and was satting 83% on room air.  This improved to 100% on nasal cannula.  He was tachycardic and in atrial fibrillation to the 130s.  On Holter he was noted to have episodes of sinus pauses and intermittent junctional bradycardia.  He had a St. Jude PPM implanted 07/2017.His [employer]. [employer] PPM was last interrogated 10/2017 and was functioning properly.  He has <1% AT/AF but had 13 high ventricular rate episodes.  Labs were notable for leukocytosis with WBC 22,000.  Lactate 6.  BNP mildly elevated at 466.  Troponin 0.04, potassium wnl.  Chest x-ray was notable for residual bilateral lower lobe pneumonia.  He received gentle IV fluids given his lower extremity edema on admission.  Initial EKG revealed atrial tachycardia.  Cardiology was consulted due to wide-complex tachycardia noted on telemetry.  He denies chest pain or palpitations.   Past Medical History:  Diagnosis Date  . Arthritis    "hands" (01/12/2013); feet (podiatry consultation); "back" (06/12/2017)  . Atrial flutter ([place])    a.  Slow ~100bpm when in 2:1; dx 04/2012. b. Placed on Xarelto.  c. s/p ablation 07-08-2012 by Dr Rayann Heman  . Carotid artery calcification    By CXR - dopplers 05/06/12 without obvious evidence of significant ICA stenosis >40%  . Colon polyps 06/26/2005  . COPD (chronic obstructive pulmonary disease) ([place])   . Diverticulosis of colon (without mention of hemorrhage)   . Gastritis, chronic    Pt denies bleeding 04/2012. EGD 2012 reportedly normal.  . GERD (gastroesophageal reflux disease)   . Hypertension   . Hyponatremia    04/2012 r/t diuretic.  . IBS (irritable bowel syndrome)   . Internal and external hemorrhoids without complication   . Macular degeneration of left eye   . Pneumonia    "@ least 5 times" (06/12/2017)  . Stroke ([place])   . Tongue cancer ([place]) 02/1997    Past Surgical History:  Procedure Laterality Date  . ATRIAL FLUTTER ABLATION N/A 07/08/2012   Procedure: ATRIAL FLUTTER ABLATION;  Surgeon: Thompson Grayer, MD;  Location: [facility] CATH LAB;  Service: Cardiovascular;  Laterality: N/A;  . CARDIAC CATHETERIZATION  04/2012  . CATARACT EXTRACTION W/ INTRAOCULAR LENS IMPLANT Left   . COLONOSCOPY  08/29/10   diverticulosis, internal hemorrhoids  . ESOPHAGOGASTRODUODENOSCOPY  09/20/10   normal  . [place]  . LEFT HEART CATHETERIZATION WITH CORONARY ANGIOGRAM N/A 05/06/2012   Procedure: LEFT HEART CATHETERIZATION WITH CORONARY ANGIOGRAM;  Surgeon: Peter M Martinique, MD;  Location[facility] C[facility] LAB;  Service: Cardiovascular;  Laterality: N/A;  . 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 PACEMAKER IMPLANT N/A 08/01/2017   Procedure: PACEMAKER IMPLANT;  Surgeon: Constance Haw, MD;  Location: [facility] CV LAB;  Service: Cardiovascular;  Laterality: N/A;  . TRIGGER FINGER RELEASE Right 1970's   "2 fingers"  . WART FULGURATION Left 09/15/2015   Procedure: EXCISIONal biospy of left peri anual and anual canal mass;  Surgeon: Greer Pickerel, MD;  Location: [facility] ORS;  Service: General;   Laterality: Left;     Home Medications:  Prior to Admission medications   Medication Sig Start Date End Date Taking? Authorizing Provider  Amino Acids-Protein Hydrolys (FEEDING SUPPLEMENT, PRO-STAT SUGAR FREE 64,) LIQD Take 30 mLs by mouth 2 (two) times daily. 12/19/17   Thurnell Lose, MD  amoxicillin-clavulanate (AUGMENTIN) 500-125 MG tablet Take 1 tablet (500 mg total) by mouth 3 (three) times daily. 12/19/17   Thurnell Lose, MD  apixaban (ELIQUIS) 2.5 MG TABS tablet Take 1 tablet (2.5 mg total) by mouth 2 (two) times daily. Resume 7/28/19PM 12/20/17   Plotnikov, Evie Lacks, MD  atorvastatin (LIPITOR) 40 MG tablet Take 1 tablet (40 mg total) by mouth daily at 6 PM. Patient taking differently: Take 20 mg by mouth daily at 6 PM.  08/21/17   Plotnikov, Evie Lacks, MD  cholecalciferol (VITAMIN D) 1000 units tablet Take 1 tablet (1,000 Units total) by mouth daily. 10/15/16   Plotnikov, Evie Lacks, MD  clidinium-chlordiazePOXIDE (LIBRAX) 5-2.5 MG capsule Take 1 capsule by mouth 2 (two) times daily. 12/24/17   Plotnikov, Evie Lacks, MD  cyanocobalamin 1000 MCG tablet Take 1,000 mcg by mouth daily.     [provider]  escitalopram (LEXAPRO) 10 MG tablet Take 1 tablet (10 mg total) by mouth every morning. 12/20/17   Plotnikov, Evie Lacks, MD  famotidine (PEPCID) 20 MG tablet Take 1 tablet (20 mg total) by mouth at bedtime. 12/20/17   Plotnikov, Evie Lacks, MD  feeding supplement, ENSURE ENLIVE, (ENSURE ENLIVE) LIQD Take 237 mLs by mouth 2 (two) times daily between meals. Patient taking differently: Take 237 mLs by mouth every other day.  11/27/17   Lavina Hamman, MD  fluticasone Asencion Islam) 50 MCG/ACT nasal spray Place 2 sprays into both nostrils daily as needed for allergies or rhinitis. 12/20/17   Plotnikov, Evie Lacks, MD  gabapentin (NEURONTIN) 300 MG capsule Take 3 capsules at bedtime. 12/20/17   Plotnikov, Evie Lacks, MD  ipratropium (ATROVENT) 0.02 % nebulizer solution Take 2.5 mLs (0.5  mg total) by nebulization 3 (three) times daily. Via [facility] 12/20/17   Plotnikov, Evie Lacks, MD  Maltodextrin-Xanthan Gum (RESOURCE THICKENUP CLEAR) POWD Take 1 g by mouth as needed. Patient taking differently: Take by mouth See admin instructions. Mix/use in all liquids- sodas, water, etc.. 11/27/17   Lavina Hamman, MD  mirtazapine (REMERON) 15 MG tablet Take 1 tablet (15 mg total) by mouth at bedtime. 12/24/17   Plotnikov, Evie Lacks, MD  multivitamin-lutein (OCUVITE-LUTEIN) CAPS capsule Take 1 capsule by mouth daily.     [provider]  pantoprazole (PROTONIX) 40 MG tablet Take 1 tablet (40 mg total) by mouth daily. Take 30-60 min before first meal of the day 12/20/17   Plotnikov, Evie Lacks, MD  polyethylene glycol powder (GLYCOLAX/MIRALAX) powder Take 17 g by mouth 2 (two) times daily as needed for mild constipation. 12/25/17   Plotnikov, Evie Lacks, MD  senna-docusate (SENOKOT-S) 8.6-50 MG tablet Take 1 tablet by mouth daily. 12/20/17   Plotnikov, Evie Lacks, MD  tetrahydrozoline 0.05 %  ophthalmic solution Place 1 drop into both eyes as needed (dry eyes).    [provider]    Inpatient Medications: Scheduled Meds:  Continuous Infusions: . azithromycin Stopped (12/27/17 1538)  . [START ON 12/28/2017] ceFEPime (MAXIPIME) IV    . [START ON 12/29/2017] vancomycin     PRN Meds:   Allergies:    Allergies  Allergen Reactions  . Dyazide [Hydrochlorothiazide W-Triamterene] Other (See Comments)    Caused hyponatremia 04/2012  . Lisinopril Cough    Social History:   Social History   Socioeconomic History  . Marital status: Divorced    Spouse name: Not on file  . Number of children: 2  . Years of education: 32  . Highest education level: Not on file  Occupational History  . Occupation: Retired  Scientific laboratory technician  . Financial resource strain: Not on file  . Food insecurity:    Worry: Not on file    Inability: Not on file  . Transportation needs:    Medical: Not on  file    Non-medical: Not on file  Tobacco Use  . Smoking status: Former Smoker    Packs/day: 0.50    Years: 20.00    Pack years: 10.00    Types: Cigarettes, Pipe, Cigars    Last attempt to quit: 12/08/1996    Years since quitting: 21.0  . Smokeless tobacco: Never Used  . Tobacco comment:    Substance and Sexual Activity  . Alcohol use: No    Alcohol/week: 0.0 standard drinks    Frequency: Never    Comment: last since 1998  . Drug use: Never  . Sexual activity: Not Currently  Lifestyle  . Physical activity:    Days per week: Not on file    Minutes per session: Not on file  . Stress: Not on file  Relationships  . Social connections:    Talks on phone: Not on file    Gets together: Not on file    Attends religious service: Not on file    Active member of club or organization: Not on file    Attends meetings of clubs or organizations: Not on file    Relationship status: Not on file  . Intimate partner violence:    Fear of current or ex partner: Not on file    Emotionally abused: Not on file    Physically abused: Not on file    Forced sexual activity: Not on file  Other Topics Concern  . Not on file  Social History Narrative   Marital status: divorced; lives with ex-wife; not dating in 2017.      Children:  2 sons, 2 grandsons, 1 granddaughter; no gg.      Lives: with ex-wife in house.      Employment: retired first time age 89; retired in 1997.  Barista x 25 years; Actor.      Tobacco: quit smoking 1997; smoked for 27 years.      Alcohol: quit 2004      Exercise:  Walking daily short distances.       ADLs:  NO assistant devices; has cane if needs it; drives; pays bill; grocery shopping by wife; wife cleans house and does laundry.     Advanced Directives:  +LIVING WILL; +FULL CODE.  HCPOA: oldest son Prather Failla Sr.)    Family History:    Family History  Problem Relation Age of Onset  . Heart disease Brother   . Throat cancer Brother   .  Other Mother        UNSURE  . Other Father        UNSURE  . Emphysema Sister   . Emphysema Sister   . Other Brother        UNSURE  . Diabetes Other      ROS:  Please see the history of present illness.   All other ROS reviewed and negative.     Physical Exam/Data:   Vitals:   12/27/17 1349 12/27/17 1527 12/27/17 1615 12/27/17 1715  BP:  (!) 94/42 (!) 106/47 (!) 90/55  Pulse: (!) 135 (!) 112 (!) 102 (!) 47  Resp: (!) 22 (!) 35 20 15  Temp: (!) 100.5 F (38.1 C)     TempSrc: Rectal     SpO2: (!) 83% 100% 100% 100%   VS:  BP (!) 90/55   Pulse (!) 47   Temp (!) 100.5 F (38.1 C) (Rectal)   Resp 15   SpO2 100%  , BMI There is no height or weight on file to calculate BMI. GENERAL: Chronically ill-appearing.  Frail.  No acute distress.  HEENT: Pupils equal round and reactive, fundi not visualized, oral mucosa unremarkable NECK:  No jugular venous distention, waveform within normal limits, carotid upstroke brisk and symmetric, no bruits LUNGS:  Bilateral rhonchi. HEART:  Tachycardic.  Regular rhythm PMI not displaced or sustained,S1 and S2 within normal limits, no S3, no S4, no clicks, no rubs, II/VI systolic murmur at the apex ABD:  Flat, positive bowel sounds normal in frequency in pitch, no bruits, no rebound, no guarding, no midline pulsatile mass, no hepatomegaly, no splenomegaly EXT:  2 plus pulses throughout, 2+ LE edema, no cyanosis no clubbing SKIN:  No rashes no nodules NEURO:  Cranial nerves II through XII grossly intact, motor grossly intact throughout PSYCH:  Cognitively intact, oriented to person place and time   EKG:  The EKG was personally reviewed and demonstrates:  MAT.  Rate 116 bpm.  LAFB.  LVH with secondary repolarization abnormalities.  Telemetry:  Telemetry was personally reviewed and demonstrates:  Atrial tachycardia.  Sinus rhythm.  ASVP (long AV delay).  No VT.   Relevant CV Studies: Echo 06/04/17: Study Conclusions  - Left ventricle: The  cavity size was normal. There was mild   concentric hypertrophy. Systolic function was normal. The   estimated ejection fraction was in the range of 55% to 60%.   Hypokinesis of the basal-midanteroseptal and inferoseptal   myocardium. Doppler parameters are consistent with a reversible   restrictive pattern, indicative of decreased left ventricular   diastolic compliance and/or increased left atrial pressure (grade   3 diastolic dysfunction). - Aortic valve: Valve mobility was restricted. Transvalvular   velocity was within the normal range. There was no stenosis.   There was no regurgitation. Valve area (VTI): 2.75 cm^2. Valve   area (Vmax): 3.08 cm^2. Valve area (Vmean): 3.6 cm^2. - Mitral valve: Calcified annulus. Transvalvular velocity was   within the normal range. There was no evidence for stenosis.   There was mild regurgitation. - Left atrium: The atrium was mildly dilated. - Right ventricle: Systolic function was normal. - Tricuspid valve: There was moderate-severe regurgitation. - Pulmonic valve: There was moderate regurgitation. - Pulmonary arteries: Systolic pressure was mildly increased. PA   peak pressure: 45 mm Hg (S).  Impressions:  - Visually, there appears to be severe aortic stenosis. However,   gradients across the aortic valve are consistent with mild aortic   stenosis. Consider  repeating limited echo and looking at right   parasternal views, TEE or cath if clinically indicated.   Laboratory Data:  Chemistry Recent Labs  Lab 12/27/17 1356 12/27/17 1431  NA 141 138  K 4.1 4.0  CL 101 103  CO2 23  --   GLUCOSE 103* 96  BUN 13 15  CREATININE 1.43* 1.20  CALCIUM 8.5*  --   GFRNONAA 45*  --   GFRAA 52*  --   ANIONGAP 17*  --     Recent Labs  Lab 12/27/17 1356  PROT 6.6  ALBUMIN 1.9*  AST 48*  ALT 30  ALKPHOS 83  BILITOT 0.7   Hematology Recent Labs  Lab 12/27/17 1356 12/27/17 1431  WBC 22.0*  --   RBC 3.84*  --   HGB 10.8* 11.6*    HCT 35.0* 34.0*  MCV 91.1  --   MCH 28.1  --   MCHC 30.9  --   RDW 15.2  --   PLT 308  --    Cardiac EnzymesNo results for input(s): TROPONINI in the last 168 hours.  Recent Labs  Lab 12/27/17 1430  TROPIPOC 0.04    BNP Recent Labs  Lab 12/27/17 1359  BNP 466.3*    DDimer No results for input(s): DDIMER in the last 168 hours.  Radiology/Studies:  Dg Chest Port 1 View  Result Date: 12/27/2017 CLINICAL DATA:  Shortness of breath with dry cough EXAM: PORTABLE CHEST 1 VIEW COMPARISON:  12/16/2017 FINDINGS: There is bilateral lower lobe airspace disease. There is no pleural effusion or pneumothorax. The heart mediastinum are stable. There is a dual lead cardiac pacemaker. The osseous structures are unremarkable. IMPRESSION: Persistent hazy bilateral lower lobe airspace disease likely reflecting residual pneumonia. Electronically Signed   By: Kathreen Devoid   On: 12/27/2017 14:29    Assessment and Plan:   # Wide complex tachycardia: Rates are very similar to the atrial rates seen immediately before and after.  On review of his telemetry there are P waves right after the wide QRS and then there is a long AV delay followed by ventricular pacing spikes.  There is no VT. Will have device interrogated in the AM to confirm.  Would not change cardiac medications at this time.  # Paroxysmal atrial fibrillation:  Continue Eliquis.  He is currently in atrial fibrillation.  # Recurrent aspiration pneumonia: # Hypoxic respiratory failure: # Failure to thrive: Per primary team.  # Acute on chronic diastolic heart failure: Gentle diuresis when BP allows.  For questions or updates, please contact [place] Please consult [email or web address] for contact info under     Signed, Skeet Latch, MD  12/27/2017 6:03 PM

## 2017-12-27 NOTE — ED Provider Notes (Signed)
Please see prior ER provider note full full H&P. Patient admitted to hospitalist service. Hospitalist service requesting cardiology consultation secondary to intermittent wide complex rhythm, patient DNR.   17:35: CONSULT: Discussed with cardiologist Dr. Oval Linsey- cardiology will provide consultation.        Leafy Kindle 12/27/17 1906    Hayden Rasmussen, MD 12/28/17 1116

## 2017-12-27 NOTE — Progress Notes (Signed)
Attempted call back to Ed nurse X3 for report

## 2017-12-27 NOTE — ED Notes (Signed)
PAGED ADMITING PER RN

## 2017-12-27 NOTE — Telephone Encounter (Signed)
Pt at ED.

## 2017-12-27 NOTE — Progress Notes (Signed)
Pharmacy Antibiotic Note  Chad [name] is a 84 y.o. male admitted on 12/27/2017 with pneumonia.    Plan: Vanc 1500 mg x 1 then 1750 q36 Cefepime 2 g q24h  Azithro per MD Monitor renal fx cx vanc lvls prn      Temp (24hrs), Avg:100.5 F (38.1 C), Min:100.5 F (38.1 C), Max:100.5 F (38.1 C)  Recent Labs  Lab 12/27/17 1356 12/27/17 1431 12/27/17 1436  WBC 22.0*  --   --   CREATININE  --  1.20  --   LATICACIDVEN  --   --  6.44*    Estimated Creatinine Clearance: 47.3 mL/min (by C-G formula based on SCr of 1.2 mg/dL).    Allergies  Allergen Reactions  . Dyazide [Hydrochlorothiazide W-Triamterene] Other (See Comments)    Caused hyponatremia 04/2012  . Lisinopril Cough   Vancomycin 1750 mg IV Q 36 hrs. Goal AUC 400-550. Expected AUC: 508 SCr used: 1.2  Levester Fresh, PharmD, BCPS, BCCCP Clinical Pharmacist [phone]  Please check AMION for all [place] numbers  12/27/2017 2:39 PM

## 2017-12-27 NOTE — ED Provider Notes (Addendum)
[facility]ERGENCY DEPARTMENT Provider Note   CSN: [id number] Arrival date & time: 12/27/17  1328     History   Chief Complaint Chief Complaint  Patient presents with  . Shortness of Breath  . Leg Swelling    HPI Chad [name] is a 84 y.o. male.  HPI  Patient is an 84 year old male with a history of COPD, CAD, CVA, failure to thrive, recurrent aspiration pneumonia, atrial fibrillation on Eliquis presenting for shortness of breath, weakness, and cough.  Patient is currently a hospice patient receiving twice weekly visits by hospice RN.  Patient presents with his wife who assist in most of the history taking.  Patient's wife reports that the patient finished his antibiotics 3 days ago from his most recent episode of aspiration pneumonia, however 2 days ago had an episode of what appeared to be aspiration has been coughing ever since.  No fevers recorded at home.  She also notes that bilateral legs seem to be swelling with right greater than left.  Patient typically takes Eliquis daily, however his wife reports they have been out of it this week.  She reports that the prescription was accidentally thrown away during his most recent hospitalization.  Patient has no complaints at this time, reports that he does not feel significantly more ill than his baseline, denies any pain at this time or feeling short of breath.  Patient does not have home oxygen.  Patient presents with DNR form.  Discussed wishes at length with the patient and his wife at bedside, they state that he would like full scope of current treatment medically but is DNR.  Past Medical History:  Diagnosis Date  . Arthritis    "hands" (01/12/2013); feet (podiatry consultation); "back" (06/12/2017)  . Atrial flutter ([place])    a. Slow ~100bpm when in 2:1; dx 04/2012. b. Placed on Xarelto.  c. s/p ablation 07-08-2012 by Dr Rayann Heman  . Carotid artery calcification    By CXR - dopplers 05/06/12 without obvious  evidence of significant ICA stenosis >40%  . Colon polyps 06/26/2005  . COPD (chronic obstructive pulmonary disease) ([place])   . Diverticulosis of colon (without mention of hemorrhage)   . Gastritis, chronic    Pt denies bleeding 04/2012. EGD 2012 reportedly normal.  . GERD (gastroesophageal reflux disease)   . Hypertension   . Hyponatremia    04/2012 r/t diuretic.  . IBS (irritable bowel syndrome)   . Internal and external hemorrhoids without complication   . Macular degeneration of left eye   . Pneumonia    "@ least 5 times" (06/12/2017)  . Stroke ([place])   . Tongue cancer ([place]) 02/1997    Patient Active Problem List   Diagnosis Date Noted  . Goals of care, counseling/discussion   . Palliative care by specialist   . Aspiration pneumonia ([place]) 12/16/2017  . CAD (coronary artery disease) 12/02/2017  . FTT (failure to thrive) in adult 12/02/2017  . Dysphagia 12/02/2017  . Umbilical hernia 76/73/4193  . DNR (do not resuscitate) 12/02/2017  . Acute respiratory failure with hypoxemia ([place]) 11/24/2017  . Legal blindness 10/15/2017  . Blood per rectum 09/13/2017  . Weight loss 09/13/2017  . Syncope 08/01/2017  . Wart viral 06/17/2017  . Bradycardia   . Calcium channel blocker overdose   . Back pain 06/11/2017  . Symptomatic bradycardia 06/11/2017  . Hypertension 06/11/2017  . 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 History of CVA (cerebrovascular accident)-May 2019 06/11/2017  .  Acute hyponatremia 06/11/2017  . Chronic kidney disease (CKD), stage IV (severe) ([place]) 06/11/2017  . Chronic diastolic heart failure ([place]) 06/11/2017  . HLD (hyperlipidemia) 06/11/2017  . Paroxysmal atrial flutter ([place]) 06/11/2017  . Cerebral embolism with cerebral infarction 06/04/2017  . CVA (cerebral vascular accident) ([place]) 06/04/2017  . Stroke-like symptoms 06/03/2017  . Bronchiectasis without complication ([place]) 10/62/6948  . DOE (dyspnea on exertion) 04/24/2017    . Cough in adult 02/13/2017  . Flatulence 02/13/2017  . Hemoptysis 06/12/2016  . Creatinine elevation 06/12/2016  . Eyebrow laceration, right, sequela 05/18/2016  . Abnormal TSH 05/18/2016  . Well adult exam 04/20/2016  . Enlarged prostate 04/20/2016  . Community acquired pneumonia   . Pneumonia of both lungs due to infectious organism 03/02/2016  . 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 Anal lesion 06/07/2015  . Gas 01/18/2015  . Aortic stenosis 06/08/2013  . Unsteadiness 02/11/2013  . Ectopic atrial tachycardia ([place]) 01/12/2013  . Atrial tachycardia ([place]) 01/12/2013  . Atrial flutter ([place]) 05/06/2012  . Aortic valve sclerosis 05/06/2012  . Acute renal failure ([place]) 05/05/2012  . 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 Anemia 07/27/2010  . Essential hypertension 11/15/2008  . COPD (chronic obstructive pulmonary disease) with chronic bronchitis ([place]) 11/15/2008  . GERD 11/15/2008  . IBS 11/15/2008  . Midsternal chest pain 11/15/2008    Past Surgical History:  Procedure Laterality Date  . ATRIAL FLUTTER ABLATION N/A 07/08/2012   Procedure: ATRIAL FLUTTER ABLATION;  Surgeon: Thompson Grayer, MD;  Location: [facility] CATH LAB;  Service: Cardiovascular;  Laterality: N/A;  . CARDIAC CATHETERIZATION  04/2012  . CATARACT EXTRACTION W/ INTRAOCULAR LENS IMPLANT Left   . COLONOSCOPY  08/29/10   diverticulosis, internal hemorrhoids  . ESOPHAGOGASTRODUODENOSCOPY  09/20/10   normal  . [place]  . LEFT HEART CATHETERIZATION WITH CORONARY ANGIOGRAM N/A 05/06/2012   Procedure: LEFT HEART CATHETERIZATION WITH CORONARY ANGIOGRAM;  Surgeon: Peter M Martinique, MD;  Location: [facility] CATH LAB;  Service: Cardiovascular;  Laterality: N/A;  . 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 PACEMAKER IMPLANT N/A 08/01/2017   Procedure: PACEMAKER IMPLANT;  Surgeon: Constance Haw, MD;  Location: [facility] CV LAB;   Service: Cardiovascular;  Laterality: N/A;  . TRIGGER FINGER RELEASE Right 1970's   "2 fingers"  . WART FULGURATION Left 09/15/2015   Procedure: EXCISIONal biospy of left peri anual and anual canal mass;  Surgeon: Greer Pickerel, MD;  Location: [facility] ORS;  Service: General;  Laterality: Left;        Home Medications    Prior to Admission medications   Medication Sig Start Date End Date Taking? Authorizing Provider  Amino Acids-Protein Hydrolys (FEEDING SUPPLEMENT, PRO-STAT SUGAR FREE 64,) LIQD Take 30 mLs by mouth 2 (two) times daily. 12/19/17   Thurnell Lose, MD  amoxicillin-clavulanate (AUGMENTIN) 500-125 MG tablet Take 1 tablet (500 mg total) by mouth 3 (three) times daily. 12/19/17   Thurnell Lose, MD  apixaban (ELIQUIS) 2.5 MG TABS tablet Take 1 tablet (2.5 mg total) by mouth 2 (two) times daily. Resume 7/28/19PM 12/20/17   Plotnikov, Evie Lacks, MD  atorvastatin (LIPITOR) 40 MG tablet Take 1 tablet (40 mg total) by mouth daily at 6 PM. Patient taking differently: Take 20 mg by mouth daily at 6 PM.  08/21/17   Plotnikov, Evie Lacks, MD  cholecalciferol (VITAMIN D) 1000 units tablet Take 1 tablet (1,000 Units total) by mouth daily.  10/15/16   Plotnikov, Evie Lacks, MD  clidinium-chlordiazePOXIDE (LIBRAX) 5-2.5 MG capsule Take 1 capsule by mouth 2 (two) times daily. 12/24/17   Plotnikov, Evie Lacks, MD  cyanocobalamin 1000 MCG tablet Take 1,000 mcg by mouth daily.     [provider]  escitalopram (LEXAPRO) 10 MG tablet Take 1 tablet (10 mg total) by mouth every morning. 12/20/17   Plotnikov, Evie Lacks, MD  famotidine (PEPCID) 20 MG tablet Take 1 tablet (20 mg total) by mouth at bedtime. 12/20/17   Plotnikov, Evie Lacks, MD  feeding supplement, ENSURE ENLIVE, (ENSURE ENLIVE) LIQD Take 237 mLs by mouth 2 (two) times daily between meals. Patient taking differently: Take 237 mLs by mouth every other day.  11/27/17   Lavina Hamman, MD  fluticasone Asencion Islam) 50 MCG/ACT nasal spray Place 2  sprays into both nostrils daily as needed for allergies or rhinitis. 12/20/17   Plotnikov, Evie Lacks, MD  gabapentin (NEURONTIN) 300 MG capsule Take 3 capsules at bedtime. 12/20/17   Plotnikov, Evie Lacks, MD  ipratropium (ATROVENT) 0.02 % nebulizer solution Take 2.5 mLs (0.5 mg total) by nebulization 3 (three) times daily. Via [facility] 12/20/17   Plotnikov, Evie Lacks, MD  Maltodextrin-Xanthan Gum (RESOURCE THICKENUP CLEAR) POWD Take 1 g by mouth as needed. Patient taking differently: Take by mouth See admin instructions. Mix/use in all liquids- sodas, water, etc.. 11/27/17   Lavina Hamman, MD  mirtazapine (REMERON) 15 MG tablet Take 1 tablet (15 mg total) by mouth at bedtime. 12/24/17   Plotnikov, Evie Lacks, MD  multivitamin-lutein (OCUVITE-LUTEIN) CAPS capsule Take 1 capsule by mouth daily.     [provider]  pantoprazole (PROTONIX) 40 MG tablet Take 1 tablet (40 mg total) by mouth daily. Take 30-60 min before first meal of the day 12/20/17   Plotnikov, Evie Lacks, MD  polyethylene glycol powder (GLYCOLAX/MIRALAX) powder Take 17 g by mouth 2 (two) times daily as needed for mild constipation. 12/25/17   Plotnikov, Evie Lacks, MD  senna-docusate (SENOKOT-S) 8.6-50 MG tablet Take 1 tablet by mouth daily. 12/20/17   Plotnikov, Evie Lacks, MD  tetrahydrozoline 0.05 % ophthalmic solution Place 1 drop into both eyes as needed (dry eyes).    [provider]    Family History Family History  Problem Relation Age of Onset  . Heart disease Brother   . Throat cancer Brother   . Other Mother        UNSURE  . Other Father        UNSURE  . Emphysema Sister   . Emphysema Sister   . Other Brother        UNSURE  . Diabetes Other     Social History Social History   Tobacco Use  . Smoking status: Former Smoker    Packs/day: 0.50    Years: 20.00    Pack years: 10.00    Types: Cigarettes, Pipe, Cigars    Last attempt to quit: 12/08/1996    Years since quitting: 21.0  . Smokeless  tobacco: Never Used  . Tobacco comment:    Substance Use Topics  . Alcohol use: No    Alcohol/week: 0.0 standard drinks    Frequency: Never    Comment: last since 1998  . Drug use: Never     Allergies   Dyazide [hydrochlorothiazide w-triamterene] and Lisinopril   Review of Systems Review of Systems  Constitutional: Positive for chills and fever.  HENT: Negative for congestion, rhinorrhea, sinus pain and sore throat.   Eyes:  Negative for visual disturbance.  Respiratory: Positive for cough and shortness of breath. Negative for chest tightness.   Cardiovascular: Positive for leg swelling. Negative for chest pain and palpitations.  Gastrointestinal: Negative for abdominal pain, nausea and vomiting.  Genitourinary: Negative for dysuria and flank pain.  Musculoskeletal: Negative for back pain and myalgias.  Skin: Negative for rash.  Neurological: Negative for dizziness, syncope, light-headedness and headaches.     Physical Exam Updated Vital Signs Pulse (!) 135   Temp (!) 100.5 F (38.1 C) (Rectal)   Resp (!) 22   SpO2 (!) 83%   Physical Exam Vitals signs and nursing note reviewed.  Constitutional:      Appearance: He is ill-appearing.     Comments: Thin, appearing chronically ill but awakening to answer questions.   HENT:     Head: Normocephalic and atraumatic.  Eyes:     Conjunctiva/sclera: Conjunctivae normal.     Pupils: Pupils are equal, round, and reactive to light.  Neck:     Musculoskeletal: Normal range of motion and neck supple.  Cardiovascular:     Rate and Rhythm: Regular rhythm. Tachycardia present.     Pulses:          Radial pulses are 2+ on the right side and 2+ on the left side.       Dorsalis pedis pulses are 2+ on the right side and 2+ on the left side.     Heart sounds: S1 normal and S2 normal. No murmur. Gallop: No calf tenderness.   Pulmonary:     Effort: Pulmonary effort is normal. Tachypnea present.     Breath sounds: Examination of the  right-middle field reveals rales. Examination of the left-middle field reveals rales. Examination of the right-lower field reveals rales. Examination of the left-lower field reveals rales. Rales present. No wheezing.  Abdominal:     General: There is no distension.     Palpations: Abdomen is soft.     Tenderness: There is no abdominal tenderness. There is no guarding.  Genitourinary:    Comments: Sacral area examined with RN chaperone present.  Patient has breakdown of the sacral region with 2 stage II pressure ulcers approximately dime sized superior to the gluteal cleft. Musculoskeletal: Normal range of motion.        General: No deformity.     Right lower leg: 2+ Edema present.     Left lower leg: 1+ Edema present.  Lymphadenopathy:     Cervical: No cervical adenopathy.  Skin:    General: Skin is warm and dry.     Findings: No erythema or rash.  Neurological:     Comments: Cranial nerves grossly intact. Patient moves extremities symmetrically and with good coordination.  Psychiatric:        Behavior: Behavior normal.        Thought Content: Thought content normal.        Judgment: Judgment normal.      ED Treatments / Results  Labs (all labs ordered are listed, but only abnormal results are displayed) Labs Reviewed  CULTURE, BLOOD (ROUTINE X 2)  CULTURE, BLOOD (ROUTINE X 2)  URINE CULTURE  COMPREHENSIVE METABOLIC PANEL  CBC WITH DIFFERENTIAL/PLATELET  URINALYSIS, ROUTINE W REFLEX MICROSCOPIC  BRAIN NATRIURETIC PEPTIDE  BLOOD GAS, VENOUS  I-STAT CG4 LACTIC ACID, ED  I-STAT CHEM 8, ED  I-STAT TROPONIN, ED    EKG EKG Interpretation  Date/Time:  Friday December 27 2017 13:37:03 EST Ventricular Rate:  116 PR Interval:  QRS Duration: 111 QT Interval:  357 QTC Calculation: 496 R Axis:   -75 Text Interpretation:  Sinus or ectopic atrial tachycardia Multiform ventricular premature complexes Left anterior fascicular block LVH with secondary repolarization abnormality  Anterior infarct, old Since last tracing rate faster Confirmed by Wandra Arthurs ([id number]) on 12/27/2017 1:45:08 PM   Radiology Dg Chest Port 1 View  Result Date: 12/27/2017 CLINICAL DATA:  Shortness of breath with dry cough EXAM: PORTABLE CHEST 1 VIEW COMPARISON:  12/16/2017 FINDINGS: There is bilateral lower lobe airspace disease. There is no pleural effusion or pneumothorax. The heart mediastinum are stable. There is a dual lead cardiac pacemaker. The osseous structures are unremarkable. IMPRESSION: Persistent hazy bilateral lower lobe airspace disease likely reflecting residual pneumonia. Electronically Signed   By: Kathreen Devoid   On: 12/27/2017 14:29    Procedures Procedures (including critical care time)  CRITICAL CARE Performed by: Albesa Seen   Total critical care time: 35 minutes  Critical care time was exclusive of separately billable procedures and treating other patients.  Critical care was necessary to treat or prevent imminent or life-threatening deterioration.  Critical care was time spent personally by me on the following activities: development of treatment plan with patient and/or surrogate as well as nursing, discussions with consultants, evaluation of patient's response to treatment, examination of patient, obtaining history from patient or surrogate, ordering and performing treatments and interventions, ordering and review of laboratory studies, ordering and review of radiographic studies, pulse oximetry and re-evaluation of patient's condition.   Medications Ordered in ED Medications  sodium chloride 0.9 % bolus 1,000 mL (has no administration in time range)  azithromycin (ZITHROMAX) 500 mg in sodium chloride 0.9 % 250 mL IVPB (has no administration in time range)  acetaminophen (TYLENOL) suppository 650 mg (has no administration in time range)  vancomycin (VANCOCIN) 1,500 mg in sodium chloride 0.9 % 500 mL IVPB (has no administration in time range)  ceFEPIme  (MAXIPIME) 2 g in sodium chloride 0.9 % 100 mL IVPB (has no administration in time range)     Initial Impression / Assessment and Plan / ED Course  I have reviewed the triage vital signs and the nursing notes.  Pertinent labs & imaging results that were available during my care of the patient were reviewed by me and considered in my medical decision making (see chart for details).  Clinical Course as of Dec 27 1716  Fri Dec 27, 2017  1415 Resp(!): 22 [AM]  1415 Reassessed at bedside. Sats now 88-89% on 2L [place].  SpO2(!): 83 % [AM]  1449 Noted. Will reassess after each liter of NS to continue fluid resuscitation.  Lactic Acid, Venous(!!): 6.44 [AM]  1604 BP 100/51 at present.    [AM]  1630 Repeat lactic is 2.38. Improving. Reassessed and mental status is improving.    [AM]    Clinical Course User Index [AM] Albesa Seen, PA-C    Patient is critically ill and requiring a higher level of care. Sepsis suspected. Code sepsis called. Patient meeting SIRS criteria based on temperature, tachycardia, and tachypnea. See vitals below. Suspected source of infection aspiration pneumonia based on history and exam. Lactic acid 6.44 initially. Signs of end organ dysfunction include lactic acidosis.  Vitals:   12/27/17 1349  Pulse: (!) 135  Resp: (!) 22  Temp: (!) 100.5 F (38.1 C)  TempSrc: Rectal  SpO2: (!) 83%     1415. Broad spectrum antibiotics initiated based on suspected source of infection. Alternative fluid  bolus administered due to HRpEF and inability to intubate for volume overload due to DNR status. Will use BiPap if respiratory status worsening.  Fluid management discussed with attending physician Dr. Shirlyn Goltz who recommends reassessment one liter at a time.   Sepsis - Repeat Assessment  Performed at:    [facility]:   12/27/17 1615 12/27/17 1715  BP: (!) 106/47 (!) 90/55  Pulse: (!) 102 (!) 47  Resp: 20 15  Temp:    SpO2: 100% 100%     Heart:     Tachycardic  Lungs:    Bibasilar rales consistent with prior exam.   Capillary Refill:   <2 sec  Peripheral Pulse:   Radial pulse palpable  Skin:     Normal Color  Mental status improving. Patient sitting up and wishing to eat/drink.  Case was discussed with intensivist, Dr. Chase Caller who states that he would not recommend central line placement or central monitoring for hypotension and if pressors are needed, they should be given peripherally.  He states that patient would not be a candidate for ICU at this time given his DNR status in addition to clinical improvement with fluid resuscitation.  He states that pressors would not be within the family as indicated goals of care from understanding of case.  States that he would recommend additional fluid.   Discussed case with Dr. Maren Beach of [facility] who will admit patient.  I did discuss that just prior to consultation, patient had run on the monitor of what appeared to be possible V. tach.  Heart rate was around 100.  This occurred with provider in the room, patient had normal mental status.  This was printed out and shown to Dr. Maren Beach.  Magnesium ordered.  Patient's magnesium is 2.  Cardiology consultation placed at the request of Dr. Maren Beach and is to be followed by Kennith Maes, PA-C.  This is a shared visit with Dr. Shirlyn Goltz. Patient was independently evaluated by this attending physician. Attending physician consulted in evaluation and admission management.  Final Clinical Impressions(s) / ED Diagnoses   Final diagnoses:  Puerperal sepsis with acute hypoxic respiratory failure, unspecified whether septic shock present ([place])  Aspiration pneumonia of right lower lobe, unspecified aspiration pneumonia type [place])    ED Discharge Orders    None       Tamala Julian 12/27/17 1724    Albesa Seen, PA-C 12/28/17 0600    Drenda Freeze, MD 12/31/17 [phone]

## 2017-12-28 ENCOUNTER — Encounter (HOSPITAL_COMMUNITY): Payer: Self-pay | Admitting: Primary Care

## 2017-12-28 DIAGNOSIS — I480 Paroxysmal atrial fibrillation: Secondary | ICD-10-CM

## 2017-12-28 DIAGNOSIS — J410 Simple chronic bronchitis: Secondary | ICD-10-CM

## 2017-12-28 DIAGNOSIS — I350 Nonrheumatic aortic (valve) stenosis: Secondary | ICD-10-CM

## 2017-12-28 LAB — URINE CULTURE: Culture: NO GROWTH

## 2017-12-28 LAB — COMPREHENSIVE METABOLIC PANEL
ALT: 18 U/L (ref 0–44)
AST: 32 U/L (ref 15–41)
Albumin: 1.3 g/dL — ABNORMAL LOW (ref 3.5–5.0)
Alkaline Phosphatase: 68 U/L (ref 38–126)
Anion gap: 9 (ref 5–15)
BUN: 13 mg/dL (ref 8–23)
CO2: 25 mmol/L (ref 22–32)
Calcium: 7.5 mg/dL — ABNORMAL LOW (ref 8.9–10.3)
Chloride: 107 mmol/L (ref 98–111)
Creatinine, Ser: 1.23 mg/dL (ref 0.61–1.24)
GFR calc Af Amer: >60 mL/min (ref >60)
GFR, EST NON AFRICAN AMERICAN: 54 mL/min — AB (ref >60)
Glucose, Bld: 128 mg/dL — ABNORMAL HIGH (ref 70–99)
Potassium: 4 mmol/L (ref 3.5–5.1)
Sodium: 141 mmol/L (ref 135–145)
Total Bilirubin: 0.5 mg/dL (ref 0.3–1.2)
Total Protein: 4.8 g/dL — ABNORMAL LOW (ref 6.5–8.1)

## 2017-12-28 LAB — CBC
HCT: 29.3 % — ABNORMAL LOW (ref 39.0–52.0)
Hemoglobin: 9.4 g/dL — ABNORMAL LOW (ref 13.0–17.0)
MCH: 28.1 pg (ref 26.0–34.0)
MCHC: 32.1 g/dL (ref 30.0–36.0)
MCV: 87.7 fL (ref 80.0–100.0)
Platelets: 198 10*3/uL (ref 150–400)
RBC: 3.34 MIL/uL — ABNORMAL LOW (ref 4.22–5.81)
RDW: 15.1 % (ref 11.5–15.5)
WBC: 15.5 10*3/uL — ABNORMAL HIGH (ref 4.0–10.5)
nRBC: 0 % (ref 0.0–0.2)

## 2017-12-28 LAB — LACTIC ACID, PLASMA: Lactic Acid, Venous: 0.7 mmol/L (ref 0.5–1.9)

## 2017-12-28 MED ORDER — APIXABAN 5 MG PO TABS
5.0000 mg | ORAL_TABLET | Freq: Two times a day (BID) | ORAL | Status: DC
  Administered 2017-12-28 – 2018-01-02 (×9): 5 mg via ORAL
  Filled 2017-12-28 (×10): qty 1

## 2017-12-28 MED ORDER — APIXABAN 2.5 MG PO TABS
2.5000 mg | ORAL_TABLET | Freq: Two times a day (BID) | ORAL | Status: DC
  Administered 2017-12-28: 2.5 mg via ORAL
  Filled 2017-12-28: qty 1

## 2017-12-28 MED ORDER — NAPHAZOLINE-GLYCERIN 0.012-0.2 % OP SOLN
1.0000 [drp] | Freq: Four times a day (QID) | OPHTHALMIC | Status: DC | PRN
  Administered 2017-12-31 – 2018-01-01 (×2): 1 [drp] via OPHTHALMIC
  Filled 2017-12-28: qty 15

## 2017-12-28 MED ORDER — POLYETHYLENE GLYCOL 3350 17 GM/SCOOP PO POWD
17.0000 g | Freq: Two times a day (BID) | ORAL | Status: DC | PRN
  Filled 2017-12-28: qty 255

## 2017-12-28 NOTE — Progress Notes (Signed)
Hospice and [facility]) Hospital Liaison: [place] RN Visit @4 :00 PM  This is a related and covered GIP admission of 12/11 with [facility] diagnosis of Cerebral Atherosclerosis disease per Dr. Tomasa Hosteller. Patient has a OOF DNR in the home. Pt was recent admitted from 12/9-12/12 with recurrent aspiration pneumonia in  3 weeks and d/c home with hospice.   Pt back to the ED with LE swelling, productive cough and found to have fever, Leukocytosis 22,000 and BNP 466 lactic acid 6.4, chest x-ray showed hazy bilateral lower airspace disease reflecting residual pneumonia as pt was placed on IV saline.  NUTRITIONAL: DYS 2 Fluid consistency  GU/GI: One BM noted  VITALS 12/21: 123/56, 73 HR, 99% on N/C at 2 liters, 15 RR, weight 199 lbs, height 6'3  LABS: 12/21- WBC 15.5, RBC 3.34, Hemoglobin 9.4, HCT 29.3  MEDICATIONS: PIV right forearm - LR @ 125 ml/hr Zithromax 500 mg in Na Chloride 0.9% last dose 12/21 Pepcid 20 mg IVPB last dose 10:23 AM  Per MD note: Dr. Verlon Au A & Plan Severe sepsis 2/2 recurrent aspiration pneumonia continue broad-spectrum antibiotics cefepime vancomycin per pharmacy-discussed with speech therapy and changing to dysphagia to thin liquid diet as patient noncompliant with comfort feeds as per prior-awaiting family to have discussion about goals-poor overall prognosis-hypothermic this morning to 94 rechecking per nursing-continue stress dose steroids Solu-Cortef 100 every 8 Acute hypoxic respiratory failure on admission-patient satting fairly well on oxygen would not attempt desat screen Chronic atrial fibrillation with sick sinus syndrome status post pacemaker-wide-complex tachycardia earlier noted now seemingly resolved-resuming anticoagulation with Eliquis discontinue Lovenox Chronic kidney stage stage IV Baseline creatinine 1.7-creatinine is improved somewhat as below-continuing saline at 125 cc/h, recheck labs a.m. COPD-no wheeze currently hold off on management  of same CVA 05/2017-continue atorvastatin 20 daily Bipolar-holding Librax 5 twice daily, Lexapro 10 daily, gabapentin 3 at bedtime and Remeron 15 at bedtime given risk for encephalopathy-might slowly reintroduce moving forward Protein energy malnutrition moderate Pressure injuries left toe right midfoot-likely secondary to malnutrition.  Communication- RN visited at bedside with pt and son Desai [place], Brooke Bonito.) Note RN also at bedside while visiting.   Goals of Care: Ongoing with plans to return back home with Hospice   Transfer summary and medication list placed on shadow chart. Please use [facility] for ambulance transport at time of discharge.  Please call with any Hospice related questions or concerns.   Raina Mina, RN, BSN  [facility] Liaison [phone]  Hospice Liaison are on AMION

## 2017-12-28 NOTE — Progress Notes (Signed)
[facility] PROGRESS NOTE  Chad [name] NUU:[id number] DOB: 11-Oct-1931 DOA: 12/27/2017 PCP: Cassandria Anger, MD   Narrative: 84 year old male History of paroxysmal A. fib chads score >5/sick sinus syndrome status post dual-chamber [facility] 08/01/2017 HTN Prior history of CVA/TIA History of failure to thrive with severe protein energy malnutrition CKD stage IV Dyslipidemia Remote history of throat cancer Dyslipidemia  Recent admission 12/9-12/12 with recurrent aspiration pneumonia in 3 weeks-at that time was recommended hospice management and was discharged home with hospice  Came back to emergency room with lower extremity swelling productive cough and found to have fever, leukocytosis 22,000 and BNP 466 lactic acid 6.4 chest x-ray showed hazy bilateral lower airspace disease reflecting residual pneumonia patient was placed on IV saline  Critical care was consulted-recommended IV fluids Speech therapy was consulted to review as patient was supposed to be on anticonvulsants but would not drink them  A & Plan Severe sepsis 2/2 recurrent aspiration pneumonia continue broad-spectrum antibiotics cefepime vancomycin per pharmacy-discussed with speech therapy and changing to dysphagia to thin liquid diet as patient noncompliant with comfort feeds as per prior-awaiting family to have discussion about goals-poor overall prognosis-hypothermic this morning to 94 rechecking per nursing-continue stress dose steroids Solu-Cortef 100 every 8 Acute hypoxic respiratory failure on admission-patient satting fairly well on oxygen would not attempt desat screen Chronic atrial fibrillation with sick sinus syndrome status post pacemaker-wide-complex tachycardia earlier noted now seemingly resolved-resuming anticoagulation with Eliquis discontinue Lovenox Chronic kidney stage stage IV Baseline creatinine 1.7-creatinine is improved somewhat as below-continuing saline at 125 cc/h, recheck labs  a.m. COPD-no wheeze currently hold off on management of same CVA 05/2017-continue atorvastatin 20 daily Bipolar-holding Librax 5 twice daily, Lexapro 10 daily, gabapentin 3 at bedtime and Remeron 15 at bedtime given risk for encephalopathy-might slowly reintroduce moving forward Protein energy malnutrition moderate Pressure injuries left toe right midfoot-likely secondary to malnutrition    Eliquis, DNR, no family, inpatient pending discussions and resolution  Verlon Au, MD  [facility] Direct contact: [phone] --Via amion app OR  --[email or web address]; password [email or web address]  7PM-7AM contact night coverage as above 12/28/2017, 7:51 AM  LOS: 1 day   Consultants:  None  Procedures:  No  Antimicrobials:  Cefepime vancomycin  Interval history/Subjective: Awake alert pleasant no distress cannot orient to some degree but is slow to do so No pain No fever No chills Needed warming blanket this morning Is n.p.o. still  Objective:  Vitals:  Vitals:   12/27/17 2315 12/28/17 0511  BP:  (!) 123/56  Pulse:    Resp:    Temp:  (!) 97.5 F (36.4 C)  SpO2: 100%     Exam:  Awake alert pleasant no distress but slow to orient Extraocular movements intact Throat soft supple Slightly asthenic S1-S2 no murmur Limited exam but chest is clear No lower extremity edema Neurologically grossly intact   I have personally reviewed the following:  DATA   Labs:  BUN/creatinine 13/1.2 down from 13/1.4 on admission  BNP 466  WBC 15 now down to 15  Imaging studies:  X-ray    Scheduled Meds: . apixaban  2.5 mg Oral BID  . atorvastatin  20 mg Oral q1800  . heparin  5,000 Units Subcutaneous Q8H  . hydrocortisone sod succinate (SOLU-CORTEF) inj  100 mg Intravenous Q8H  . ipratropium  0.5 mg Nebulization TID  . sodium chloride flush  3 mL Intravenous Q12H   Continuous Infusions: . azithromycin Stopped (12/27/17 1538)  . ceFEPime (MAXIPIME) IV 2 g (12/27/17  2353)  .  famotidine (PEPCID) IV 20 mg (12/27/17 2100)  . lactated ringers 125 mL/hr at 12/28/17 0505  . 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

## 2017-12-28 NOTE — Progress Notes (Signed)
SLP Cancellation Note  Patient Details Name: Chad [name] MRN: [id number] DOB: 1931/01/27   Cancelled treatment:       Reason Eval/Treat Not Completed: Fatigue/lethargy limiting ability to participate. Per chart review, pt recently admitted with aspiration pneumonia; during that admission, per palliative care not 12/17/17, family opted for comfort feeding with known high risks for aspiration due to dysphagia. No family present in pt's room for discussion. RN to page SLP if family arrives; if family wishes to resume comfort feeds, MD may place orders for dys 2, thin liquids.   Deneise Lever, [place], CCC-SLP Speech-Language Pathologist Acute Rehabilitation Services Pager: [phone] Office: [phone]    Aliene Altes 12/28/2017, 9:13 AM

## 2017-12-28 NOTE — Progress Notes (Signed)
Progress Note  Patient Name: Chad [name] Date of Encounter: 12/28/2017  Primary Cardiologist: Kirk Ruths, MD    Subjective   Does not know why he is in hospital  " I think I have pneumonia"  Inpatient Medications    Scheduled Meds: . apixaban  5 mg Oral BID  . atorvastatin  20 mg Oral q1800  . hydrocortisone sod succinate (SOLU-CORTEF) inj  100 mg Intravenous Q8H  . ipratropium  0.5 mg Nebulization TID  . sodium chloride flush  3 mL Intravenous Q12H   Continuous Infusions: . azithromycin Stopped (12/27/17 1538)  . ceFEPime (MAXIPIME) IV Stopped (12/28/17 1038)  . famotidine (PEPCID) IV 20 mg (12/28/17 1023)  . lactated ringers 125 mL/hr at 12/28/17 0505  . [START ON 12/29/2017] vancomycin     PRN Meds: fluticasone, naphazoline-glycerin, ondansetron (ZOFRAN) IV, polyethylene glycol powder   Vital Signs    Vitals:   12/28/17 0753 12/28/17 0754 12/28/17 0755 12/28/17 1210  BP:      Pulse:  82    Resp:  18    Temp: (!) 94.5 F (34.7 C) (!) 94.4 F (34.7 C) (!) 94.5 F (34.7 C) (!) 97.5 F (36.4 C)  TempSrc: Axillary Oral Axillary Axillary  SpO2:  99% 99%     Intake/Output Summary (Last 24 hours) at 12/28/2017 1239 Last data filed at 12/28/2017 0547 Gross per 24 hour  Intake 1450 ml  Output -  Net 1450 ml   There were no vitals filed for this visit.  Telemetry    Regular rhythm has had some PVCls and V pacing  - Personally Reviewed  ECG    ? PAF  - Personally Reviewed  Physical Exam  Chronically ill pale white male  GEN: No acute distress.   Neck: No JVD Cardiac: RRR, AS murmurs, rubs, or gallops.  Respiratory: rhonchi exp wheezes  GI: Soft, nontender, non-distended  MS: No edema; No deformity. Neuro:  Nonfocal  Psych: Normal affect   Labs    Chemistry Recent Labs  Lab 12/27/17 1356 12/27/17 1431 12/28/17 0345  NA 141 138 141  K 4.1 4.0 4.0  CL 101 103 107  CO2 23  --  25  GLUCOSE 103* 96 128*  BUN 13 15 13   CREATININE  1.43* 1.20 1.23  CALCIUM 8.5*  --  7.5*  PROT 6.6  --  4.8*  ALBUMIN 1.9*  --  1.3*  AST 48*  --  32  ALT 30  --  18  ALKPHOS 83  --  68  BILITOT 0.7  --  0.5  GFRNONAA 45*  --  54*  GFRAA 52*  --  >60  ANIONGAP 17*  --  9     Hematology Recent Labs  Lab 12/27/17 1356 12/27/17 1431 12/28/17 0345  WBC 22.0*  --  15.5*  RBC 3.84*  --  3.34*  HGB 10.8* 11.6* 9.4*  HCT 35.0* 34.0* 29.3*  MCV 91.1  --  87.7  MCH 28.1  --  28.1  MCHC 30.9  --  32.1  RDW 15.2  --  15.1  PLT 308  --  198    Cardiac EnzymesNo results for input(s): TROPONINI in the last 168 hours.  Recent Labs  Lab 12/27/17 1430  TROPIPOC 0.04     BNP Recent Labs  Lab 12/27/17 1359  BNP 466.3*     DDimer No results for input(s): DDIMER in the last 168 hours.   Radiology    Dg Chest [place] 1  View  Result Date: 12/27/2017 CLINICAL DATA:  Shortness of breath with dry cough EXAM: PORTABLE CHEST 1 VIEW COMPARISON:  12/16/2017 FINDINGS: There is bilateral lower lobe airspace disease. There is no pleural effusion or pneumothorax. The heart mediastinum are stable. There is a dual lead cardiac pacemaker. The osseous structures are unremarkable. IMPRESSION: Persistent hazy bilateral lower lobe airspace disease likely reflecting residual pneumonia. Electronically Signed   By: Kathreen Devoid   On: 12/27/2017 14:29    Cardiac Studies   TTE 06/04/17 EF 55-60% mild AS mean gradient 8 mmHg   Patient Profile     84 y.o. male with end stage COPD on oxygen admitted with pneumonia History of mild AS And atrial arrhythmias with PPM  History of recurrent aspiration pneumonias enrolled in  Home hospice   Assessment & Plan    Arrhythmia: currently appears sinus with periods of V pacing. Asymptomatic Normal pacer Function would not change meds he is on eliquis for PAF and history of CVA   AS :  Mild by echo not a candidate for Rx;s if worsens  COPD/Pneumonia:  Clarify goals of care ? Hospice vs other continue Maxipime        For questions or updates, please contact [facility] Please consult [email or web address] for contact info under        Signed, Jenkins Rouge, MD  12/28/2017, 12:39 PM

## 2017-12-28 NOTE — Plan of Care (Signed)
  Problem: Clinical Measurements: Goal: Cardiovascular complication will be avoided Outcome: Progressing   Problem: Clinical Measurements: Goal: Respiratory complications will improve Outcome: Progressing   Problem: Clinical Measurements: Goal: Ability to maintain clinical measurements within normal limits will improve Outcome: Progressing   

## 2017-12-29 DIAGNOSIS — Z515 Encounter for palliative care: Secondary | ICD-10-CM

## 2017-12-29 DIAGNOSIS — Z7189 Other specified counseling: Secondary | ICD-10-CM

## 2017-12-29 LAB — BASIC METABOLIC PANEL
Anion gap: 8 (ref 5–15)
BUN: 12 mg/dL (ref 8–23)
CHLORIDE: 110 mmol/L (ref 98–111)
CO2: 24 mmol/L (ref 22–32)
Calcium: 7.6 mg/dL — ABNORMAL LOW (ref 8.9–10.3)
Creatinine, Ser: 1.18 mg/dL (ref 0.61–1.24)
GFR calc Af Amer: >60 mL/min (ref >60)
GFR calc non Af Amer: 56 mL/min — ABNORMAL LOW (ref >60)
GLUCOSE: 99 mg/dL (ref 70–99)
Potassium: 3.1 mmol/L — ABNORMAL LOW (ref 3.5–5.1)
Sodium: 142 mmol/L (ref 135–145)

## 2017-12-29 LAB — CBC WITH DIFFERENTIAL/PLATELET
Abs Immature Granulocytes: 0.07 10*3/uL (ref 0.00–0.07)
Basophils Absolute: 0 10*3/uL (ref 0.0–0.1)
Basophils Relative: 0 %
EOS PCT: 0 %
Eosinophils Absolute: 0 10*3/uL (ref 0.0–0.5)
HEMATOCRIT: 23.7 % — AB (ref 39.0–52.0)
Hemoglobin: 7.5 g/dL — ABNORMAL LOW (ref 13.0–17.0)
Immature Granulocytes: 1 %
Lymphocytes Relative: 5 %
Lymphs Abs: 0.7 10*3/uL (ref 0.7–4.0)
MCH: 27.3 pg (ref 26.0–34.0)
MCHC: 31.6 g/dL (ref 30.0–36.0)
MCV: 86.2 fL (ref 80.0–100.0)
Monocytes Absolute: 0.5 10*3/uL (ref 0.1–1.0)
Monocytes Relative: 4 %
Neutro Abs: 11.4 10*3/uL — ABNORMAL HIGH (ref 1.7–7.7)
Neutrophils Relative %: 90 %
Platelets: 205 10*3/uL (ref 150–400)
RBC: 2.75 MIL/uL — ABNORMAL LOW (ref 4.22–5.81)
RDW: 14.9 % (ref 11.5–15.5)
WBC: 12.6 10*3/uL — ABNORMAL HIGH (ref 4.0–10.5)
nRBC: 0 % (ref 0.0–0.2)

## 2017-12-29 MED ORDER — HYDROCORTISONE NA SUCCINATE PF 100 MG IJ SOLR
50.0000 mg | Freq: Three times a day (TID) | INTRAMUSCULAR | Status: DC
  Administered 2017-12-29 – 2017-12-30 (×4): 50 mg via INTRAVENOUS
  Filled 2017-12-29 (×4): qty 2

## 2017-12-29 MED ORDER — POTASSIUM CHLORIDE CRYS ER 20 MEQ PO TBCR
40.0000 meq | EXTENDED_RELEASE_TABLET | Freq: Every day | ORAL | Status: DC
  Administered 2017-12-29 – 2018-01-02 (×4): 40 meq via ORAL
  Filled 2017-12-29 (×5): qty 2

## 2017-12-29 MED ORDER — LORAZEPAM 2 MG/ML IJ SOLN
1.0000 mg | Freq: Four times a day (QID) | INTRAMUSCULAR | Status: DC | PRN
  Administered 2017-12-29 (×2): 1 mg via INTRAVENOUS
  Filled 2017-12-29 (×2): qty 1

## 2017-12-29 NOTE — Evaluation (Signed)
Clinical/Bedside Swallow Evaluation Patient Details  Name: Chad [name] MRN: [id number] Date of Birth: 09-13-1931  Today's Date: 12/29/2017 Time: SLP Start Time (ACUTE ONLY): 1010 SLP Stop Time (ACUTE ONLY): 1021 SLP Time Calculation (min) (ACUTE ONLY): 11 min  Past Medical History:  Past Medical History:  Diagnosis Date  . Arthritis    "hands" (01/12/2013); feet (podiatry consultation); "back" (06/12/2017)  . Atrial flutter ([place])    a. Slow ~100bpm when in 2:1; dx 04/2012. b. Placed on Xarelto.  c. s/p ablation 07-08-2012 by Dr Rayann Heman  . Carotid artery calcification    By CXR - dopplers 05/06/12 without obvious evidence of significant ICA stenosis >40%  . Colon polyps 06/26/2005  . COPD (chronic obstructive pulmonary disease) ([place])   . Diverticulosis of colon (without mention of hemorrhage)   . Gastritis, chronic    Pt denies bleeding 04/2012. EGD 2012 reportedly normal.  . GERD (gastroesophageal reflux disease)   . Hypertension   . Hyponatremia    04/2012 r/t diuretic.  . IBS (irritable bowel syndrome)   . Internal and external hemorrhoids without complication   . Macular degeneration of left eye   . Pneumonia    "@ least 5 times" (06/12/2017)  . Stroke ([place])   . Tongue cancer ([place]) 02/1997   Past Surgical History:  Past Surgical History:  Procedure Laterality Date  . ATRIAL FLUTTER ABLATION N/A 07/08/2012   Procedure: ATRIAL FLUTTER ABLATION;  Surgeon: Thompson Grayer, MD;  Location: [facility] CATH LAB;  Service: Cardiovascular;  Laterality: N/A;  . CARDIAC CATHETERIZATION  04/2012  . CATARACT EXTRACTION W/ INTRAOCULAR LENS IMPLANT Left   . COLONOSCOPY  08/29/10   diverticulosis, internal hemorrhoids  . ESOPHAGOGASTRODUODENOSCOPY  09/20/10   normal  . [place]  . LEFT HEART CATHETERIZATION WITH CORONARY ANGIOGRAM N/A 05/06/2012   Procedure: LEFT HEART CATHETERIZATION WITH CORONARY ANGIOGRAM;  Surgeon: Peter M Martinique, MD;  Location: [facility] CATH LAB;  Service: Cardiovascular;   Laterality: N/A;  . 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 PACEMAKER IMPLANT N/A 08/01/2017   Procedure: PACEMAKER IMPLANT;  Surgeon: Constance Haw, MD;  Location: [place] CV LAB;  Service: Cardiovascular;  Laterality: N/A;  . TRIGGER FINGER RELEASE Right 1970's   "2 fingers"  . WART FULGURATION Left 09/15/2015   Procedure: EXCISIONal biospy of left peri anual and anual canal mass;  Surgeon: Greer Pickerel, MD;  Location: [facility] ORS;  Service: General;  Laterality: Left;   HPI:  HPI: Chad [name] is a 84 y.o. male with medical history significant of COPD/bronchiectasis, CAD, history of stroke, failure to thrive, recurrent aspiration pneumonia, atrial fibrillation on Eliquis, who is currently enrolled in home hospice, receiving twice a week visits by hospice RN.  Pt is known to this service having completed MBSS 11/26/17 which revealed " Pt exhibits mild oral and moderate pharyngeal dysphagia with impairments resulting from multifactors including recent stroke (5/19 with 5 pna episodes since), anatomical difference (suspect cervical spine kyphosis), decreased timing of swallow initiation/laryngeal vestibule closure, mildly reduced stripping wave and questionable UES function" with recommendations for gorund diet with honey thick liquid.  Pt's intake was greatly reduced on HTL, and decision was made to liberalize diet to thin liquid and purue hospice care during recent admission with pt seen by SLP 12/17/17.  Pt has remote hx of H&N CA.  There are surgical changes to tongue, appears to be excision only w/o flap reconstruction.  Pt endorses this.  Pt denies hx rad  tx for CA but endorses further surgery to neck/throat.     Assessment / Plan / Recommendation Clinical Impression  Pt presents with hx of recurrent pneumonia and known silent penetration noted on MBSS 11/26/17.  Decision for comfort feeds was made during previous admission with recommendations for ground diet with thin  liquid.  Today pt tolerated all consistencies trialed without clinical s/s or aspiraiton; however, there remains a risk of aspiration with all PO intake.  Pt exhibited adequate oral clearance of simulated ground consistency after prolonged oral phase.  Pt will likely continue to develop pneumonias with oral intake, but does not appear to be in any distress/discomfort with PO comfort feeds.   Recommend continuing ground diet with thin liquid for comfort feeds with known risk of aspiration.   If there is a change to goals of care, further evaluation may be indicated to inform decision making. SLP Visit Diagnosis: Dysphagia, oropharyngeal phase (R13.12)    Aspiration Risk  Moderate aspiration risk    Diet Recommendation Dysphagia 2 (Fine chop);Thin liquid   Supervision: Staff to assist with self feeding Compensations: Multiple dry swallows after each bite/sip;Follow solids with liquid;Clear throat intermittently;Minimize environmental distractions;Slow rate;Small sips/bites Postural Changes: Seated upright at 90 degrees    Other  Recommendations Oral Care Recommendations: Oral care BID       Prognosis Prognosis for Safe Diet Advancement: Guarded      Swallow Study   General HPI: HPI: Chad [name] is a 84 y.o. male with medical history significant of COPD/bronchiectasis, CAD, history of stroke, failure to thrive, recurrent aspiration pneumonia, atrial fibrillation on Eliquis, who is currently enrolled in home hospice, receiving twice a week visits by hospice RN.  Pt is known to this service having completed MBSS 11/26/17 which revealed " Pt exhibits mild oral and moderate pharyngeal dysphagia with impairments resulting from multifactors including recent stroke (5/19 with 5 pna episodes since), anatomical difference (suspect cervical spine kyphosis), decreased timing of swallow initiation/laryngeal vestibule closure, mildly reduced stripping wave and questionable UES function" with  recommendations for gorund diet with honey thick liquid.  Pt's intake was greatly reduced on HTL, and decision was made to liberalize diet to thin liquid and purue hospice care during recent admission with pt seen by SLP 12/17/17.  Pt has remote hx of H&N CA.  There are surgical changes to tongue, appears to be excision only w/o flap reconstruction.  Pt endorses this.  Pt denies hx rad tx for CA but endorses further surgery to neck/throat.   Type of Study: Bedside Swallow Evaluation Previous Swallow Assessment: Pt has been seen previously by ST services during prior admission(s). Diet Prior to this Study: Dysphagia 2 (chopped);Thin liquids Temperature Spikes Noted: No Respiratory Status: Nasal cannula History of Recent Intubation: No Behavior/Cognition: Alert;Cooperative;Pleasant mood Oral Cavity Assessment: Within Functional Limits Oral Care Completed by SLP: Recent completion by staff Oral Cavity - Dentition: Edentulous Self-Feeding Abilities: Needs assist;Total assist(Posey mittens) Patient Positioning: Upright in bed Baseline Vocal Quality: Normal Volitional Cough: Strong;Wet Volitional Swallow: Able to elicit    Oral/Motor/Sensory Function Overall Oral Motor/Sensory Function: Mild impairment Facial ROM: Within Functional Limits Facial Symmetry: Within Functional Limits Lingual ROM: Within Functional Limits Lingual Strength: Reduced Velum: Within Functional Limits Mandible: Within Functional Limits   Ice Chips     Thin Liquid Thin Liquid: Within functional limits Presentation: Cup Other Comments: known silent penetration    Nectar Thick     Honey Thick     Puree Puree: Within functional limits Presentation:  Spoon   Solid     Solid: Impaired Oral Phase Functional Implications: Prolonged oral transit Other Comments: simulated ground consistency      Celedonio Savage, MA, [place] Office: [phone]; Pager [phone] 12/29/2017,10:37 AM

## 2017-12-29 NOTE — Consult Note (Signed)
Consultation Note Date: 12/29/2017   Patient Name: Chad [name]  DOB: February 02, 1931  MRN: [id number]  Age / Sex: 84 y.o., male  PCP: Plotnikov, Evie Lacks, MD Referring Physician: Nita Sells, MD  Reason for Consultation: Establishing goals of care  HPI/Patient Profile: 84 y.o. male  with past medical history of a fib on eliquis, CVA/TIA, CAD, SSS s/p PPM, CKD 4, recurrent aspiration pneumonia, COPD, GERD, and HTN admitted on 12/27/2017 with lower extremity swelling, productive cough, and fever. He was recently admitted 11/17-11/20  and 12/9- 12/12 with aspiration pna and failure to thrive both times. After last admission, patient was discharged home with hospice care. Work up revealed WBC 22, lactic acid 6.4, and BNP 466. Chest x-ray reflects pna. Per SLP evaluation, patient is moderate aspiration risk and will likely continue to develop pneumonias with oral intake, but does not appear to be in any distress/discomfort with feeds. PMT consulted for [place].  Clinical Assessment and Goals of Care: I have reviewed medical records including EPIC notes, labs and imaging, received report from Dr. Verlon Au and RN, assessed the patient and then met with patient's wife, son, and grandson  to discuss diagnosis prognosis, GOC, EOL wishes, disposition and options.  Also, spoke with hospice liaison prior to meeting with family since patient is current hospice patient. Hospice liaison encouraged goals of care discussion between PMT provider and family.   I introduced Palliative Medicine as specialized medical care for people living with serious illness. It focuses on providing relief from the symptoms and stress of a serious illness. The goal is to improve quality of life for both the patient and the family.  We discussed a brief life review of the patient. He is married - patient and wife had divorced, but remarried on Thanksgiving. They have 2 sons - 1 is  local and one is out of state.  As far as functional and nutritional status, they tell me patient spends most of his time in bed and requires assistance with ADLs. 40 pound wt loss in last 3-4 months. Frequent periods of confusion/hallucinations.    We discussed his current illness and what it means in the larger context of his on-going co-morbidities.  Natural disease trajectory and expectations at EOL were discussed. Specifically discussed overall failure to thrive. Discussed dysphagia and likely recurrence of aspiration pneumonia. Patient does not enjoy thickened liquids - family agrees for patient to be allowed PO intake that provides comfort and accepts risks of aspiration. We discussed speech therapy eval and that MBS in Nov revealed silent aspiration. We discussed recommendations made by SLP. Discussed PO intake as safely as possible - sitting up, slow, and with supervision; but also discussed that patient would likely aspirate and develop pneumonia again given history.   I attempted to elicit values and goals of care important to the patient.  They tell me the patient would want to be comfortable and be at home.   The difference between aggressive medical intervention and comfort care was considered in light of the patient's goals of care. Family would like to continue current treatment - with no escalation - then return home with hospice care.  We discussed that last time patient had discharged with hospice and, per our discussion during last admission, they did not want patient hospitalized again and hoped to keep him at home - accepting risk of aspiration and recurrent pneumonia. Family shares they were told by hospice RN to bring him to the hospital. We discussed philosophy of hospice care.  We also discussed sharing goals of care with hospice RN - to keep patient out of hospital and accepting risk of recurrent pna and focusing on his comfort when that occurs.   Advance directives, concepts  specific to code status, artifical feeding and hydration, and rehospitalization were considered and discussed. Patient is DNR and would not want life artificially prolonged.   Hospice and Palliative Care services outpatient were explained and offered. They would like to return home with hospice care. We discussed hospice facility as they are concerned about him being at home when he is at very end of life. Discussed no rehospitalization unless comfort cannot be obtained.   Questions and concerns were addressed. The family was encouraged to call with questions or concerns.   Primary Decision Maker NEXT OF KIN - wife [name] joined by son Terryon   SUMMARY OF RECOMMENDATIONS   - continue current treatment with no escalation - once stable for discharge, return home with hospice care and no rehospitalization - extensive education provided to family about aspiration, pneumonia, hospice philosophy, and plan of care moving forward in line with their goals of care  Code Status/Advance Care Planning:  DNR  Symptom Management:   Per primary - comfortable on my exam  Palliative Prophylaxis:   Aspiration and Frequent Pain Assessment  Additional Recommendations (Limitations, Scope, Preferences):  Avoid Hospitalization, Initiate Comfort Feeding and No Artificial Feeding  Psycho-social/Spiritual:   Desire for further Chaplaincy support:no  Additional Recommendations: Education on Hospice  Prognosis:   Unable to determine - poor prognosis r/t recurrent aspiration pneumonia, failure to thrive, poor functional, nutritional, and cognitive status  Discharge Planning: Home with Hospice      Primary Diagnoses: Present on Admission: . COPD (chronic obstructive pulmonary disease) with chronic bronchitis ([place]) . Pneumonia of both lungs due to infectious organism . CVA (cerebral vascular accident) ([place]) . Chronic kidney disease (CKD), stage IV (severe) ([place]) . COPD (chronic obstructive  pulmonary disease) w/ bronchiectasis . FTT (failure to thrive) in adult . Severe sepsis ([place])   I have reviewed the medical record, interviewed the patient and family, and examined the patient. The following aspects are pertinent.  Past Medical History:  Diagnosis Date  . Arthritis    "hands" (01/12/2013); feet (podiatry consultation); "back" (06/12/2017)  . Atrial flutter ([place])    a. Slow ~100bpm when in 2:1; dx 04/2012. b. Placed on Xarelto.  c. s/p ablation 07-08-2012 by Dr Rayann Heman  . Carotid artery calcification    By CXR - dopplers 05/06/12 without obvious evidence of significant ICA stenosis >40%  . Colon polyps 06/26/2005  . COPD (chronic obstructive pulmonary disease) ([place])   . Diverticulosis of colon (without mention of hemorrhage)   . Gastritis, chronic    Pt denies bleeding 04/2012. EGD 2012 reportedly normal.  . GERD (gastroesophageal reflux disease)   . Hypertension   . Hyponatremia    04/2012 r/t diuretic.  . IBS (irritable bowel syndrome)   . Internal and external hemorrhoids without complication   . Macular degeneration of left eye   . Pneumonia    "@ least 5 times" (06/12/2017)  . Stroke ([place])   . Tongue cancer ([place]) 02/1997   Social History   Socioeconomic History  . Marital status: Divorced    Spouse name: Not on file  . Number of children: 2  . Years of education: 67  . Highest education level: Not on file  Occupational History  . Occupation: Retired  Scientific laboratory technician  . Financial resource strain: Not  on file  . Food insecurity:    Worry: Not on file    Inability: Not on file  . Transportation needs:    Medical: Not on file    Non-medical: Not on file  Tobacco Use  . Smoking status: Former Smoker    Packs/day: 0.50    Years: 20.00    Pack years: 10.00    Types: Cigarettes, Pipe, Cigars    Last attempt to quit: 12/08/1996    Years since quitting: 21.0  . Smokeless tobacco: Never Used  . Tobacco comment:    Substance and Sexual Activity  . Alcohol use: No      Alcohol/week: 0.0 standard drinks    Frequency: Never    Comment: last since 1998  . Drug use: Never  . Sexual activity: Not Currently  Lifestyle  . Physical activity:    Days per week: Not on file    Minutes per session: Not on file  . Stress: Not on file  Relationships  . Social connections:    Talks on phone: Not on file    Gets together: Not on file    Attends religious service: Not on file    Active member of club or organization: Not on file    Attends meetings of clubs or organizations: Not on file    Relationship status: Not on file  Other Topics Concern  . Not on file  Social History Narrative   Marital status: divorced; lives with ex-wife; not dating in 2017.      Children:  2 sons, 2 grandsons, 1 granddaughter; no gg.      Lives: with ex-wife in house.      Employment: retired first time age 72; retired in 1997.  Barista x 25 years; Actor.      Tobacco: quit smoking 1997; smoked for 27 years.      Alcohol: quit 2004      Exercise:  Walking daily short distances.       ADLs:  NO assistant devices; has cane if needs it; drives; pays bill; grocery shopping by wife; wife cleans house and does laundry.     Advanced Directives:  +LIVING WILL; +FULL CODE.  HCPOA: oldest son Veer Elamin Sr.)   Family History  Problem Relation Age of Onset  . Heart disease Brother   . Throat cancer Brother   . Other Mother        UNSURE  . Other Father        UNSURE  . Emphysema Sister   . Emphysema Sister   . Other Brother        UNSURE  . Diabetes Other    Scheduled Meds: . apixaban  5 mg Oral BID  . atorvastatin  20 mg Oral q1800  . hydrocortisone sod succinate (SOLU-CORTEF) inj  50 mg Intravenous Q8H  . ipratropium  0.5 mg Nebulization TID  . potassium chloride  40 mEq Oral Daily  . sodium chloride flush  3 mL Intravenous Q12H   Continuous Infusions: . azithromycin 500 mg (12/28/17 1534)  . ceFEPime (MAXIPIME) IV Stopped (12/29/17 0048)  .  famotidine (PEPCID) IV 100 mL/hr at 12/29/17 1000  . lactated ringers 125 mL/hr at 12/29/17 0125  . vancomycin 1,750 mg (12/29/17 0335)   PRN Meds:.fluticasone, naphazoline-glycerin, ondansetron (ZOFRAN) IV, polyethylene glycol powder Allergies  Allergen Reactions  . Dyazide [Hydrochlorothiazide W-Triamterene] Other (See Comments)    Caused hyponatremia 04/2012  . Lisinopril Cough   Review of Systems  Unable to perform ROS: Mental status change    Physical Exam Constitutional:      General: He is not in acute distress. HENT:     Head: Normocephalic and atraumatic.  Cardiovascular:     Rate and Rhythm: Normal rate and regular rhythm.  Pulmonary:     Effort: Pulmonary effort is normal.     Breath sounds: Normal breath sounds.  Abdominal:     Palpations: Abdomen is soft.  Musculoskeletal:     Right lower leg: Edema present.     Left lower leg: Edema present.  Skin:    General: Skin is warm and dry.  Neurological:     Mental Status: He is alert. He is disoriented.  Psychiatric:        Cognition and Memory: Cognition is impaired. Memory is impaired.     Vital Signs: BP 136/68 (BP Location: Right Arm)   Pulse 77   Temp (!) 97.4 F (36.3 C) (Axillary)   Resp 20   SpO2 99%  Pain Scale: 0-10   Pain Score: 0-No pain   SpO2: SpO2: 99 % O2 Device:SpO2: 99 % O2 Flow Rate: .O2 Flow Rate (L/min): 2 L/min  IO: Intake/output summary:   Intake/Output Summary (Last 24 hours) at 12/29/2017 1058 Last data filed at 12/29/2017 1000 Gross per 24 hour  Intake 3455.35 ml  Output 950 ml  Net 2505.35 ml    LBM: Last BM Date: 12/29/17 Baseline Weight:   Most recent weight:       Palliative Assessment/Data: PPS 30%   Flowsheet Rows     Most Recent Value  Intake Tab  Referral Department  Hospitalist  Date Notified  12/27/17  Palliative Care Type  Return patient Palliative Care  Clinical Assessment  Palliative Performance Scale Score  30%  Pain Max last 24 hours  Not able  to report  Pain Min Last 24 hours  Not able to report  Dyspnea Max Last 24 Hours  Not able to report  Dyspnea Min Last 24 hours  Not able to report  Psychosocial & Spiritual Assessment  Palliative Care Outcomes  Patient/Family meeting held?  Yes  Patient/Family wishes: Interventions discontinued/not started   Mechanical Ventilation      Time Total: 75 minutes Greater than 50%  of this time was spent counseling and coordinating care related to the above assessment and plan.  Juel Burrow, DNP, AGNP-C Palliative Medicine Team [phone] Pager: [phone]

## 2017-12-29 NOTE — Progress Notes (Signed)
D/w pallaitive car eNP PAtient to continue Rx--was confused earlier today and given ativan  in the next 24-48 hours if stable am advocating for clear idea about DNH and DNI

## 2017-12-29 NOTE — Progress Notes (Signed)
Progress Note  Patient Name: Chad [name] Date of Encounter: 12/29/2017  Primary Cardiologist: Kirk Ruths, MD    Subjective   No cardiac complaints   Inpatient Medications    Scheduled Meds: . apixaban  5 mg Oral BID  . atorvastatin  20 mg Oral q1800  . hydrocortisone sod succinate (SOLU-CORTEF) inj  50 mg Intravenous Q8H  . ipratropium  0.5 mg Nebulization TID  . potassium chloride  40 mEq Oral Daily  . sodium chloride flush  3 mL Intravenous Q12H   Continuous Infusions: . azithromycin 500 mg (12/28/17 1534)  . ceFEPime (MAXIPIME) IV Stopped (12/29/17 0700)  . famotidine (PEPCID) IV 20 mg (12/29/17 0945)  . lactated ringers 125 mL/hr at 12/29/17 0125  . vancomycin 1,750 mg (12/29/17 0335)   PRN Meds: fluticasone, naphazoline-glycerin, ondansetron (ZOFRAN) IV, polyethylene glycol powder   Vital Signs    Vitals:   12/28/17 2107 12/29/17 0045 12/29/17 0127 12/29/17 0527  BP:   136/68   Pulse:   77   Resp:   20   Temp: 97.7 F (36.5 C) 97.7 F (36.5 C)  (!) 97.4 F (36.3 C)  TempSrc: Axillary Axillary  Axillary  SpO2:   100% 99%    Intake/Output Summary (Last 24 hours) at 12/29/2017 1018 Last data filed at 12/29/2017 0641 Gross per 24 hour  Intake 3355.35 ml  Output 950 ml  Net 2405.35 ml   There were no vitals filed for this visit.  Telemetry    Regular rhythm has had some PVCls and V pacing  - Personally Reviewed  ECG    ? PAF  - Personally Reviewed  Physical Exam  Chronically ill pale white male  GEN: No acute distress.   Neck: No JVD Cardiac: RRR, AS murmurs, rubs, or gallops.  Respiratory: rhonchi exp wheezes  GI: Soft, nontender, non-distended  MS: No edema; No deformity. Neuro:  Nonfocal  Psych: Normal affect   Labs    Chemistry Recent Labs  Lab 12/27/17 1356 12/27/17 1431 12/28/17 0345 12/29/17 0410  NA 141 138 141 142  K 4.1 4.0 4.0 3.1*  CL 101 103 107 110  CO2 23  --  25 24  GLUCOSE 103* 96 128* 99  BUN 13 15  13 12   CREATININE 1.43* 1.20 1.23 1.18  CALCIUM 8.5*  --  7.5* 7.6*  PROT 6.6  --  4.8*  --   ALBUMIN 1.9*  --  1.3*  --   AST 48*  --  32  --   ALT 30  --  18  --   ALKPHOS 83  --  68  --   BILITOT 0.7  --  0.5  --   GFRNONAA 45*  --  54* 56*  GFRAA 52*  --  >60 >60  ANIONGAP 17*  --  9 8     Hematology Recent Labs  Lab 12/27/17 1356 12/27/17 1431 12/28/17 0345 12/29/17 0410  WBC 22.0*  --  15.5* 12.6*  RBC 3.84*  --  3.34* 2.75*  HGB 10.8* 11.6* 9.4* 7.5*  HCT 35.0* 34.0* 29.3* 23.7*  MCV 91.1  --  87.7 86.2  MCH 28.1  --  28.1 27.3  MCHC 30.9  --  32.1 31.6  RDW 15.2  --  15.1 14.9  PLT 308  --  198 205    Cardiac EnzymesNo results for input(s): TROPONINI in the last 168 hours.  Recent Labs  Lab 12/27/17 1430  TROPIPOC 0.04  BNP Recent Labs  Lab 12/27/17 1359  BNP 466.3*     DDimer No results for input(s): DDIMER in the last 168 hours.   Radiology    Dg Chest Port 1 View  Result Date: 12/27/2017 CLINICAL DATA:  Shortness of breath with dry cough EXAM: PORTABLE CHEST 1 VIEW COMPARISON:  12/16/2017 FINDINGS: There is bilateral lower lobe airspace disease. There is no pleural effusion or pneumothorax. The heart mediastinum are stable. There is a dual lead cardiac pacemaker. The osseous structures are unremarkable. IMPRESSION: Persistent hazy bilateral lower lobe airspace disease likely reflecting residual pneumonia. Electronically Signed   By: Kathreen Devoid   On: 12/27/2017 14:29    Cardiac Studies   TTE 06/04/17 EF 55-60% mild AS mean gradient 8 mmHg   Patient Profile     84 y.o. male with end stage COPD on oxygen admitted with pneumonia History of mild AS And atrial arrhythmias with PPM  History of recurrent aspiration pneumonias enrolled in  Home hospice   Assessment & Plan    Arrhythmia: currently appears sinus with periods of V pacing. Asymptomatic Normal pacer Function would not change meds he is on eliquis for PAF and history of CVA   AS  :  Mild by echo not a candidate for Rx;s if worsens  COPD/Pneumonia:  Clarify goals of care ? Hospice vs other continue Maxipime      For questions or updates, please contact [facility] Please consult [email or web address] for contact info under        Signed, Jenkins Rouge, MD  12/29/2017, 10:18 AM

## 2017-12-29 NOTE — Progress Notes (Signed)
Hospice and [facility]) Hospital Liaison: CIP RN Visit @ [phone]  This is a related and covered GIP admission of 12/11 with [facility] diagnosis of Cerebral Atherosclerosis disease per Dr. Tomasa Hosteller. Patient has a OOF DNR in the home. Pt was recent admitted from 12/9-12/12 with recurrent aspiration pneumonia in  3 weeks and d/c home with hospice. Pt back to the ED with LE swelling, productive cough and found to have fever, Leukocytosis 22,000 and BNP 466 lactic acid 6.4, chest x-ray showed hazy bilateral lower airspace disease reflecting residual pneumonia.  Pt alert however more confused and    NUTRITIONAL: DYS 2 Fluid consistency  GU/GI:  Pt underwent a bladder scan with 978 on scan volume levels. Foley catheter in place.Last BM 12/28/2017  VITALS: 12/22 136/68 , 77 HR, 99% on N/C at 2 liters, 20 RR, weight 199.6 lbs, height 6'3  LABS: 12/21- WBC 12.6, RBC 2.75, Hemoglobin 7.5, HCT 23.7, Ca 7.6, Potassium 3.1  MEDICATIONS: PIV left forearm - LR @ 125 ml/hr Vancocin 1750 mg in Na Chloride 0.9% started 12/22 with last dose @0641  Maxipime 2 g in NA chloride 0.9% last dose 12/22  @0700  Pepcid 20 mg IVPB last dose 12/22 @0945  Zithromax 500 mg in Na Chloride 0.9% last dose 12/21 * Bedside nurse indicated pt's K was low on recent lab work and plans are to replace   Per MD note:Dr. Verlon Au on 12/29/2017 A & Plan  Severe sepsis 2/2 recurrent aspiration pneumonia continue broad-spectrum antibiotics cefepime vancomycin per pharmacy-discussed with speech therapy and changing to dysphagia 2 thin liquid diet as patient noncompliant with comfort feeds as per prior  Acute hypoxic respiratory failure on admission-patient satting fairly well on oxygen would not attempt desat screen  Chronic atrial fibrillation with sick sinus syndrome status post pacemaker-wide-complex tachycardia earlier noted now seemingly resolved-resuming anticoagulation with Eliquis discontinue  Lovenox  Hypokalemia-replacing with K. Dur 40 mg and will repeat labs a.m.  Likely dilutional anemia-hemoglobin has dropped to some degree with copious fluid repletion-Hemoccult stool after is any concern of dark stool do not anticipate invasive work-up  Chronic kidney stage stage IV Baseline creatinine 1.7-creatinine is improved somewhat as below-continuing saline at 125 cc/h, recheck labs a.m.  COPD-no wheeze currently hold off on management of same  CVA 05/2017-continue atorvastatin 20 daily  Bipolar-holding Librax 5 twice daily, Lexapro 10 daily, gabapentin 3 at bedtime and Remeron 15 at bedtime given risk for encephalopathy-would hold off on these meds for the time being  Protein energy malnutrition moderate  Pressure injuries left toe right midfoot-likely secondary to malnutrition    Eliquis, DNR, called family-will call again later today, inpatient pending discussions and resolution   Communication- RN visited pt today at bedside with no family available however update reported received from bedside nurse ([place]).  Goals of Care: RN spoke with [facility] with Palliative Medicine who will have another conversation on the goals of care with both family and pt to have a better understanding of pt condition on pt's increase risk of aspiration. Ongoing with plans to return back home with Hospice  Please use [facility] for ambulance transport at time of discharge.  Please call with any Hospice related questions or concerns.   Raina Mina, RN, BSN  [facility] Liaison [phone]  Hospice Liaison are on AMION

## 2017-12-29 NOTE — Progress Notes (Signed)
[facility] PROGRESS NOTE  Chad [name] SKA:[id number] DOB: 06-Jun-1931 DOA: 12/27/2017 PCP: Cassandria Anger, MD   Narrative: 84 year old male History of paroxysmal A. fib chads scoreTheir feeling is that the patient is so what happened with your patient yesterday >5/sick sinus syndrome status post dual-chamber [facility] 08/01/2017 HTN Prior history of CVA/TIA History of failure to thrive with severe protein energy malnutrition CKD stage IV Dyslipidemia Remote history of throat cancer Dyslipidemia  Recent admission 12/9-12/12 with recurrent aspiration pneumonia in 3 weeks-at that time was recommended hospice management and was discharged home with hospice  Came back to emergency room with lower extremity swelling productive cough and found to have fever, leukocytosis 22,000 and BNP 466 lactic acid 6.4 chest x-ray showed hazy bilateral lower airspace disease reflecting residual pneumonia patient was placed on IV saline  Critical care was consulted-recommended IV fluids Speech therapy was consulted to review as patient was supposed to be on anticonvulsants but would not drink them  A & Plan  Severe sepsis 2/2 recurrent aspiration pneumonia continue broad-spectrum antibiotics cefepime vancomycin per pharmacy-discussed with speech therapy and changing to dysphagia 2 thin liquid diet as patient noncompliant with comfort feeds as per prior  Acute hypoxic respiratory failure on admission-patient satting fairly well on oxygen would not attempt desat screen  Chronic atrial fibrillation with sick sinus syndrome status post pacemaker-wide-complex tachycardia earlier noted now seemingly resolved-resuming anticoagulation with Eliquis discontinue Lovenox  Hypokalemia-replacing with K. Dur 40 mg and will repeat labs a.m.  Likely dilutional anemia-hemoglobin has dropped to some degree with copious fluid repletion-Hemoccult stool after is any concern of dark stool do not anticipate  invasive work-up  Chronic kidney stage stage IV Baseline creatinine 1.7-creatinine is improved somewhat as below-continuing saline at 125 cc/h, recheck labs a.m.  COPD-no wheeze currently hold off on management of same  CVA 05/2017-continue atorvastatin 20 daily  Bipolar-holding Librax 5 twice daily, Lexapro 10 daily, gabapentin 3 at bedtime and Remeron 15 at bedtime given risk for encephalopathy-would hold off on these meds for the time being  Protein energy malnutrition moderate  Pressure injuries left toe right midfoot-likely secondary to malnutrition    Eliquis, DNR, called family-will call again later today, inpatient pending discussions and resolution  Verlon Au, MD  [facility] Direct contact: [phone] --Via amion app OR  --[email or web address]; password [email or web address]  7PM-7AM contact night coverage as above 12/29/2017, 9:39 AM  LOS: 2 days   Consultants:  None  Procedures:  No  Antimicrobials:  Cefepime vancomycin  Interval history/Subjective:  Awake but slightly confused was pulling at tubes and Foley Can oriented to place and year but then seems to get confused again Nursing reports no new issues  Objective:  Vitals:  Vitals:   12/29/17 0127 12/29/17 0527  BP: 136/68   Pulse: 77   Resp: 20   Temp:  (!) 97.4 F (36.3 C)  SpO2: 100% 99%    Exam:  Slightly confused at times but able to slowly orient Extraocular movements intact Throat soft supple Slightly asthenic S1-S2 no murmur Limited exam but chest is clear No lower extremity edema Neurologically grossly intact   I have personally reviewed the following:  DATA   Labs:  BUN/creatinine 12/1.1 down from 13/1.4 on admission  BNP 466  WBC 15 now down to 12.6  Hemoglobin down from 9.4-7.5  Potassium 3.1  Imaging studies:  X-ray    Scheduled Meds: . apixaban  5 mg Oral BID  . atorvastatin  20 mg Oral q1800  .  hydrocortisone sod succinate (SOLU-CORTEF) inj  100 mg Intravenous Q8H   . ipratropium  0.5 mg Nebulization TID  . potassium chloride  40 mEq Oral Daily  . sodium chloride flush  3 mL Intravenous Q12H   Continuous Infusions: . azithromycin 500 mg (12/28/17 1534)  . ceFEPime (MAXIPIME) IV Stopped (12/29/17 0700)  . famotidine (PEPCID) IV Stopped (12/29/17 0700)  . lactated ringers 125 mL/hr at 12/29/17 0125  . vancomycin 1,750 mg (12/29/17 0335)    Principal Problem:   Sepsis ([place]) Active Problems:   COPD (chronic obstructive pulmonary disease) with chronic bronchitis (HCC)   Pneumonia of both lungs due to infectious organism   CVA (cerebral vascular accident) ([place])   COPD (chronic obstructive pulmonary disease) w/ bronchiectasis   History of CVA (cerebrovascular accident)-May 2019   Chronic kidney disease (CKD), stage IV (severe) (HCC)   FTT (failure to thrive) in adult   Severe sepsis (HCC)   PAF (paroxysmal atrial fibrillation) ([place])   LOS: 2 days

## 2017-12-30 DIAGNOSIS — A419 Sepsis, unspecified organism: Principal | ICD-10-CM

## 2017-12-30 LAB — CBC WITH DIFFERENTIAL/PLATELET
Abs Immature Granulocytes: 0.08 10*3/uL — ABNORMAL HIGH (ref 0.00–0.07)
BASOS ABS: 0 10*3/uL (ref 0.0–0.1)
Basophils Relative: 0 %
Eosinophils Absolute: 0 10*3/uL (ref 0.0–0.5)
Eosinophils Relative: 0 %
HEMATOCRIT: 28.7 % — AB (ref 39.0–52.0)
Hemoglobin: 8.9 g/dL — ABNORMAL LOW (ref 13.0–17.0)
Immature Granulocytes: 1 %
Lymphocytes Relative: 6 %
Lymphs Abs: 0.8 10*3/uL (ref 0.7–4.0)
MCH: 27.1 pg (ref 26.0–34.0)
MCHC: 31 g/dL (ref 30.0–36.0)
MCV: 87.5 fL (ref 80.0–100.0)
Monocytes Absolute: 0.8 10*3/uL (ref 0.1–1.0)
Monocytes Relative: 6 %
Neutro Abs: 12.8 10*3/uL — ABNORMAL HIGH (ref 1.7–7.7)
Neutrophils Relative %: 87 %
Platelets: 241 10*3/uL (ref 150–400)
RBC: 3.28 MIL/uL — ABNORMAL LOW (ref 4.22–5.81)
RDW: 15.7 % — ABNORMAL HIGH (ref 11.5–15.5)
WBC: 14.5 10*3/uL — ABNORMAL HIGH (ref 4.0–10.5)
nRBC: 0 % (ref 0.0–0.2)

## 2017-12-30 LAB — BASIC METABOLIC PANEL
Anion gap: 12 (ref 5–15)
BUN: 13 mg/dL (ref 8–23)
CHLORIDE: 111 mmol/L (ref 98–111)
CO2: 21 mmol/L — ABNORMAL LOW (ref 22–32)
Calcium: 8.1 mg/dL — ABNORMAL LOW (ref 8.9–10.3)
Creatinine, Ser: 1.25 mg/dL — ABNORMAL HIGH (ref 0.61–1.24)
GFR calc Af Amer: >60 mL/min (ref >60)
GFR calc non Af Amer: 53 mL/min — ABNORMAL LOW (ref >60)
Glucose, Bld: 93 mg/dL (ref 70–99)
Potassium: 4 mmol/L (ref 3.5–5.1)
Sodium: 144 mmol/L (ref 135–145)

## 2017-12-30 LAB — GLUCOSE, CAPILLARY
Glucose-Capillary: 79 mg/dL (ref 70–99)
Glucose-Capillary: 121 mg/dL — ABNORMAL HIGH (ref 70–99)

## 2017-12-30 LAB — LEGIONELLA PNEUMOPHILA SEROGP 1 UR AG: L. pneumophila Serogp 1 Ur Ag: NEGATIVE

## 2017-12-30 MED ORDER — MORPHINE SULFATE (CONCENTRATE) 10 MG/0.5ML PO SOLN: 10.0000 mg | ORAL | Status: DC | PRN

## 2017-12-30 MED ORDER — HYDROCORTISONE NA SUCCINATE PF 100 MG IJ SOLR
25.0000 mg | Freq: Two times a day (BID) | INTRAMUSCULAR | Status: DC
  Administered 2017-12-30 – 2017-12-31 (×2): 25 mg via INTRAVENOUS
  Filled 2017-12-30 (×2): qty 2

## 2017-12-30 MED ORDER — IPRATROPIUM-ALBUTEROL 0.5-2.5 (3) MG/3ML IN SOLN: 3.0000 mL | RESPIRATORY_TRACT | Status: DC | PRN

## 2017-12-30 MED ORDER — SODIUM CHLORIDE 0.9 % IV SOLN
3.0000 g | Freq: Three times a day (TID) | INTRAVENOUS | Status: DC
  Administered 2017-12-30 – 2018-01-01 (×5): 3 g via INTRAVENOUS
  Filled 2017-12-30 (×6): qty 3

## 2017-12-30 NOTE — Consult Note (Signed)
   [facility] CM Inpatient Consult   12/30/2017  DEMARCUS THIELKE 12-27-1931 [id number]  Patient screened for extreme high risk score for unplanned readmissions Chart was briefed and found that the patient is still active with [facility] in the Hospice program. Will sign off.  Patient to receive full community follow up under Hospice care. For questions contact:   Natividad Brood, RN BSN [facility] Liaison  [phone] business mobile phone Toll free office [phone]

## 2017-12-30 NOTE — Progress Notes (Signed)
Pharmacy Antibiotic Note  Chad [name] is a 84 y.o. male admitted on 12/27/2017 with recurrent aspiration pneumonia.  Pharmacy consulted to narrow to Unasyn. Today is day #4 of antibiotics.  Plan: Unasyn 3 g IV q8h Discontinued azithromycin Consider treating for 7 days total Pharmacy signing off, please re-consult if needed     Temp (24hrs), Avg:97.8 F (36.6 C), Min:97.5 F (36.4 C), Max:98 F (36.7 C)  Recent Labs  Lab 12/27/17 1356 12/27/17 1431 12/27/17 1436 12/27/17 1624 12/27/17 2032 12/27/17 2334 12/28/17 0345 12/29/17 0410 12/30/17 0352  WBC 22.0*  --   --   --   --   --  15.5* 12.6* 14.5*  CREATININE 1.43* 1.20  --   --   --   --  1.23 1.18 1.25*  LATICACIDVEN  --   --  6.44* 2.38* 0.8 0.7  --   --   --     Estimated Creatinine Clearance: 45.4 mL/min (A) (by C-G formula based on SCr of 1.25 mg/dL (H)).    Allergies  Allergen Reactions  . Dyazide [Hydrochlorothiazide W-Triamterene] Other (See Comments)    Caused hyponatremia 04/2012  . Lisinopril Cough    Renold Genta, PharmD, BCPS Clinical Pharmacist Clinical phone for 12/30/2017 until 3p is [phone] 12/30/2017 1:47 PM  **Pharmacist phone directory can now be found on [email or web address] listed under [place]**

## 2017-12-30 NOTE — Progress Notes (Signed)
[facility] PROGRESS NOTE  WYLDER MACOMBER TMH:[id number] DOB: 07-07-1931 DOA: 12/27/2017  PCP: Cassandria Anger, MD  Brief History/Interval Summary: 84 year old male with a past medical history of paroxysmal atrial fibrillation, sick sinus syndrome status post pacemaker, essential hypertension, history of stroke, protein calorie malnutrition, chronic kidney disease stage IV, dyslipidemia, remote history of throat cancer and a recent hospitalization for aspiration pneumonia.  Patient was discharged home with hospice services.  Brought back into the emergency department with cough fever leukocytosis.  Again found to have bilateral lower airspace disease.  Placed on antibiotics.  Critical care service was consulted.  Palliative medicine consulted.  Reason for Visit: Recurrent aspiration pneumonia.  Consultants: Palliative medicine.  Cardiology.  Procedures: None  Antibiotics: Comycin and cefepime and azithromycin. Vancomycin cefepime will be discontinued on 12/23.  Unasyn will be initiated.  Subjective/Interval History: Patient agitated this morning.  Eyes closed.  Does not respond to any questions.  Noted to have mittens on his hands.  ROS: Unable to do due to his agitation  Objective:  Vital Signs  Vitals:   12/29/17 2137 12/30/17 0426 12/30/17 0427 12/30/17 0817  BP: 124/68 (!) 150/79    Pulse: 71 81    Resp: 20 18    Temp: 97.8 F (36.6 C) 97.8 F (36.6 C)    TempSrc: Oral Oral    SpO2: 97% (!) 85% 97% 91%    Intake/Output Summary (Last 24 hours) at 12/30/2017 1248 Last data filed at 12/30/2017 1206 Gross per 24 hour  Intake 1830.27 ml  Output 600 ml  Net 1230.27 ml   There were no vitals filed for this visit.  General appearance: appears stated age, delirious, distracted and no distress Head: Normocephalic, without obvious abnormality, atraumatic Resp: Coarse breath sounds bilaterally.  Noted to be tachypneic.  No use of accessory muscles.  Crackles  appreciated bilaterally.  No wheezing or rhonchi. Cardio: regular rate and rhythm, S1, S2 normal, no murmur, click, rub or gallop GI: soft, non-tender; bowel sounds normal; no masses,  no organomegaly Extremities: extremities normal, atraumatic, no cyanosis or edema Pulses: 2+ and symmetric Skin: Skin color, texture, turgor normal. No rashes or lesions Neurologic: Noted to be agitated.  Confused.  Unable to answer questions.  Moving all his extremities.  Lab Results:  Data Reviewed: I have personally reviewed following labs and imaging studies  CBC: Recent Labs  Lab 12/27/17 1356 12/27/17 1431 12/28/17 0345 12/29/17 0410 12/30/17 0352  WBC 22.0*  --  15.5* 12.6* 14.5*  NEUTROABS 19.0*  --   --  11.4* 12.8*  HGB 10.8* 11.6* 9.4* 7.5* 8.9*  HCT 35.0* 34.0* 29.3* 23.7* 28.7*  MCV 91.1  --  87.7 86.2 87.5  PLT 308  --  198 205 921    Basic Metabolic Panel: Recent Labs  Lab 12/27/17 1356 12/27/17 1431 12/27/17 1641 12/28/17 0345 12/29/17 0410 12/30/17 0352  NA 141 138  --  141 142 144  K 4.1 4.0  --  4.0 3.1* 4.0  CL 101 103  --  107 110 111  CO2 23  --   --  25 24 21*  GLUCOSE 103* 96  --  128* 99 93  BUN 13 15  --  13 12 13   CREATININE 1.43* 1.20  --  1.23 1.18 1.25*  CALCIUM 8.5*  --   --  7.5* 7.6* 8.1*  MG  --   --  2.0  --   --   --     GFR:  Estimated Creatinine Clearance: 45.4 mL/min (A) (by C-G formula based on SCr of 1.25 mg/dL (H)).  Liver Function Tests: Recent Labs  Lab 12/27/17 1356 12/28/17 0345  AST 48* 32  ALT 30 18  ALKPHOS 83 68  BILITOT 0.7 0.5  PROT 6.6 4.8*  ALBUMIN 1.9* 1.3*    CBG: Recent Labs  Lab 12/30/17 0754  GLUCAP 79    Recent Results (from the past 240 hour(s))  Blood Culture (routine x 2)     Status: None (Preliminary result)   Collection Time: 12/27/17  2:10 PM  Result Value Ref Range Status   Specimen Description BLOOD RIGHT ARM  Final   Special Requests   Final    BOTTLES DRAWN AEROBIC AND ANAEROBIC Blood  Culture results may not be optimal due to an inadequate volume of blood received in culture bottles   Culture   Final    NO GROWTH 3 DAYS Performed at [facility], [place] [address] [place], [place] [address]    Report Status PENDING  Incomplete  Blood Culture (routine x 2)     Status: None (Preliminary result)   Collection Time: 12/27/17  2:39 PM  Result Value Ref Range Status   Specimen Description BLOOD LEFT ARM  Final   Special Requests   Final    BOTTLES DRAWN AEROBIC ONLY Blood Culture results may not be optimal due to an inadequate volume of blood received in culture bottles   Culture   Final    NO GROWTH 3 DAYS Performed at [facility], [place] [address] [place], [place] [address]    Report Status PENDING  Incomplete  Urine culture     Status: None   Collection Time: 12/27/17  6:39 PM  Result Value Ref Range Status   Specimen Description URINE, CATHETERIZED  Final   Special Requests NONE  Final   Culture   Final    NO GROWTH Performed at [facility], [address] [place], [place] [address]    Report Status 12/28/2017 FINAL  Final      Radiology Studies: No results found.   Medications:  Scheduled: . apixaban  5 mg Oral BID  . atorvastatin  20 mg Oral q1800  . hydrocortisone sod succinate (SOLU-CORTEF) inj  50 mg Intravenous Q8H  . ipratropium  0.5 mg Nebulization TID  . potassium chloride  40 mEq Oral Daily  . sodium chloride flush  3 mL Intravenous Q12H   Continuous: . azithromycin Stopped (12/29/17 1700)  . ceFEPime (MAXIPIME) IV Stopped (12/30/17 0045)  . famotidine (PEPCID) IV 20 mg (12/30/17 0955)  . lactated ringers 50 mL/hr at 12/30/17 0400  . vancomycin 1,750 mg (12/29/17 0335)   ZGY:FVCBSWHQPRF, LORazepam, naphazoline-glycerin, ondansetron (ZOFRAN) IV, polyethylene glycol powder    Assessment/Plan:  Severe sepsis secondary to recurrent aspiration pneumonia Patient on broad-spectrum antibiotics with vancomycin and cefepime.   Cultures negative so far.  Changed to Unasyn.  Patient was admitted with aspiration pneumonia during his previous hospitalization as well.  He was discharged home with hospice services.  Palliative medicine reconsulted to assist further management.  They have seen the patient.  It appears that family still wants to return home with hospice care if possible.  No rehospitalization.  Acute respiratory failure with hypoxia Secondary to aspiration.  Continue oxygen.  Acute metabolic encephalopathy/delirium Likely secondary to acute infection and hospital stay.  Patient with a known history of bipolar disorder as well.  Steroids also contributing.  Unknown if  patient has cognitive impairment as well.  Chronic atrial fibrillation with sick sinus syndrome status post pacemaker Patient noted to have wide-complex tachycardia.  Seems to have resolved.  PVCs noted.  Patient noted to be on Eliquis.  Cardiology has seen the patient.  No further interventions planned.  Normocytic anemia Drop in hemoglobin is likely dilutional.  No evidence of overt bleeding.  History of COPD Continue oxygen.  Seems to be stable.  History of stroke Stable  History of bipolar disorder Holding his psychotropic medications.  Moderate protein calorie malnutrition Nutritional supplements.  Pressure injury to left toe right midfoot Wound care  Chronic kidney disease stage IV with hyperkalemia Potassium is normal today.  Renal function stable.  Monitor urine output.  DVT Prophylaxis: Apixaban    Code Status: DNR Family Communication: No family at bedside Disposition Plan: Management as outlined above.  Patient's prognosis is guarded at this time.    LOS: 3 days   [facility] Pager [phone] 12/30/2017, 12:48 PM  If 7PM-7AM, please contact night-coverage at [email or web address], password [facility]

## 2017-12-30 NOTE — Progress Notes (Signed)
Daily Progress Note   Patient Name: Chad [name]       Date: 12/30/2017 DOB: 07-Mar-1931  Age: 84 y.o. MRN#: [id number] Attending Physician: Bonnielee Haff, MD Primary Care Physician: Cassandria Anger, MD Admit Date: 12/27/2017  Reason for Consultation/Follow-up: Establishing goals of care  Subjective: Patient sleeping, appears comfortable, no family at bedside.   Length of Stay: 3  Current Medications: Scheduled Meds:  . apixaban  5 mg Oral BID  . atorvastatin  20 mg Oral q1800  . hydrocortisone sod succinate (SOLU-CORTEF) inj  25 mg Intravenous Q12H  . ipratropium  0.5 mg Nebulization TID  . potassium chloride  40 mEq Oral Daily  . sodium chloride flush  3 mL Intravenous Q12H    Continuous Infusions: . ampicillin-sulbactam (UNASYN) IV    . famotidine (PEPCID) IV 20 mg (12/30/17 0955)  . lactated ringers 50 mL/hr at 12/30/17 0400    PRN Meds: fluticasone, LORazepam, morphine CONCENTRATE, naphazoline-glycerin, ondansetron (ZOFRAN) IV, polyethylene glycol powder  Physical Exam         Constitutional:      General: He is not in acute distress. HENT:     Head: Normocephalic and atraumatic.  Cardiovascular:     Rate and Rhythm: Normal rate and regular rhythm.  Pulmonary:     Effort: Pulmonary effort is normal.     Breath sounds: Normal breath sounds.  Abdominal:     Palpations: Abdomen is soft.  Musculoskeletal:     Right lower leg: Edema present.     Left lower leg: Edema present.  Skin:    General: Skin is warm and dry.  Neurological:     Mental Status: He is alert. He is disoriented.  Psychiatric:        Cognition and Memory: Cognition is impaired. Memory is impaired.    Vital Signs: BP (!) 150/80 (BP Location: Right Arm)   Pulse 81   Temp 98 F (36.7 C)  (Axillary)   Resp 19   SpO2 95%  SpO2: SpO2: 95 % O2 Device: O2 Device: Nasal Cannula O2 Flow Rate: O2 Flow Rate (L/min): 6 L/min  Intake/output summary:   Intake/Output Summary (Last 24 hours) at 12/30/2017 1539 Last data filed at 12/30/2017 1206 Gross per 24 hour  Intake 1830.27 ml  Output 600 ml  Net 1230.27 ml   LBM: Last BM Date: 12/29/17 Baseline Weight:   Most recent weight:         Palliative Assessment/Data: PPS 30%    Flowsheet Rows     Most Recent Value  Intake Tab  Referral Department  Hospitalist  Unit at Time of Referral  Intermediate Care Unit  Palliative Care Primary Diagnosis  Sepsis/Infectious Disease  Date Notified  12/27/17  Palliative Care Type  Return patient Palliative Care  Reason for referral  Clarify Goals of Care  Date of Admission  12/27/17  Date first seen by Palliative Care  12/29/17  # of days Palliative referral response time  2 Day(s)  # of days IP prior to Palliative referral  0  Clinical Assessment  Palliative Performance Scale Score  30%  Pain Max last 24 hours  Not able to report  Pain Min Last 24 hours  Not able to report  Dyspnea Max Last 24 Hours  Not able to report  Dyspnea Min Last 24 hours  Not able to report  Psychosocial & Spiritual Assessment  Palliative Care Outcomes  Patient/Family meeting held?  Yes  Who was at the meeting?  son, wife, grandson  Palliative Care Outcomes  Clarified goals of care, Counseled regarding hospice, Provided psychosocial or spiritual support  Patient/Family wishes: Interventions discontinued/not started   Mechanical Ventilation      Patient Active Problem List   Diagnosis Date Noted  . PAF (paroxysmal atrial fibrillation) ([place])   . Sepsis ([place]) 12/27/2017  . Severe sepsis ([place]) 12/27/2017  . Goals of care, counseling/discussion   . Palliative care by specialist   . Aspiration pneumonia ([place]) 12/16/2017  . CAD (coronary artery disease) 12/02/2017  . FTT (failure to thrive) in adult  12/02/2017  . Dysphagia 12/02/2017  . Umbilical hernia 09/47/0962  . DNR (do not resuscitate) 12/02/2017  . Acute respiratory failure with hypoxemia ([place]) 11/24/2017  . Legal blindness 10/15/2017  . Blood per rectum 09/13/2017  . Weight loss 09/13/2017  . Syncope 08/01/2017  . Wart viral 06/17/2017  . Bradycardia   . Calcium channel blocker overdose   . Back pain 06/11/2017  . Symptomatic bradycardia 06/11/2017  . Hypertension 06/11/2017  . 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 History of CVA (cerebrovascular accident)-May 2019 06/11/2017  . Acute hyponatremia 06/11/2017  . Chronic kidney disease (CKD), stage IV (severe) ([place]) 06/11/2017  . Chronic diastolic heart failure ([place]) 06/11/2017  . HLD (hyperlipidemia) 06/11/2017  . Paroxysmal atrial flutter ([place]) 06/11/2017  . Cerebral embolism with cerebral infarction 06/04/2017  . CVA (cerebral vascular accident) ([place]) 06/04/2017  . Stroke-like symptoms 06/03/2017  . Bronchiectasis without complication ([place]) 83/66/2947  . DOE (dyspnea on exertion) 04/24/2017  . Cough in adult 02/13/2017  . Flatulence 02/13/2017  . Hemoptysis 06/12/2016  . Creatinine elevation 06/12/2016  . Eyebrow laceration, right, sequela 05/18/2016  . Abnormal TSH 05/18/2016  . Well adult exam 04/20/2016  . Enlarged prostate 04/20/2016  . Community acquired pneumonia   . Pneumonia of both lungs due to infectious organism 03/02/2016  . 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 Anal lesion 06/07/2015  . Gas 01/18/2015  . Aortic stenosis 06/08/2013  . Unsteadiness 02/11/2013  . Ectopic atrial tachycardia ([place]) 01/12/2013  . Atrial tachycardia ([place]) 01/12/2013  . Atrial flutter ([place]) 05/06/2012  . Aortic valve sclerosis 05/06/2012  . Acute renal failure ([place]) 05/05/2012  . 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 Localized cancer of throat ([place]) 02/04/2012  . Anemia 07/27/2010  . Essential  hypertension 11/15/2008  . COPD (chronic obstructive pulmonary disease) with chronic bronchitis ([place]) 11/15/2008  . GERD 11/15/2008  . IBS 11/15/2008  . Midsternal chest pain 11/15/2008    Palliative Care Assessment & Plan   HPI: 84 y.o. male  with past medical history of a fib on eliquis, CVA/TIA,CAD, SSS s/p PPM, CKD 4, recurrent aspiration pneumonia, COPD, GERD, and HTN admitted on 12/27/2017 with lower extremity swelling, productive cough, and fever. He was recently admitted 11/17-11/20  and 12/9- 12/12 with aspiration pna and failure to thrive both times. After last admission, patient was discharged home with hospice care. Work up revealed WBC 22, lactic acid 6.4, and BNP 466. Chest x-ray reflects pna. Per SLP evaluation, patient is moderate aspiration risk and will likely continue to develop pneumonias with oral intake, but does not appear to be in any distress/discomfort with  feeds. PMT consulted for [place].  Assessment: Follow up today with patient. Appears comfortable. No family at bedside. Plan remains appropriate to return home with hospice care once he is stable for discharge - continue current treatment of pneumonia.   Roxanol added by PMT MD. Recommend this - and SL ativan - be continued on discharge to be used as needed for comfort.   Recommendations/Plan: - continue current treatment with no escalation - once stable for discharge, return home with hospice care and no rehospitalization - extensive education provided to family about aspiration, pneumonia, hospice philosophy, and plan of care moving forward in line with their goals of care - Recommend discharging home with roxanol and sublingual ativan PRN for comfort  Goals of Care and Additional Recommendations:  Limitations on Scope of Treatment: Avoid Hospitalization, Initiate Comfort Feeding and No Artificial Feeding  Code Status:  DNR  Prognosis:  Unable to determine - poor prognosis r/t recurrent aspiration  pneumonia, failure to thrive, poor functional, nutritional, and cognitive status  Discharge Planning:  Home with Hospice  Care plan was discussed with family and hospice liaison on 12/22, PMT MD 12/23  Thank you for allowing the Palliative Medicine Team to assist in the care of this patient.   Total Time 15 minutes Prolonged Time Billed  no       Greater than 50%  of this time was spent counseling and coordinating care related to the above assessment and plan.  Juel Burrow, DNP, [facility] Palliative Medicine Team Team Phone # [phone]  Pager [phone]

## 2017-12-30 NOTE — Progress Notes (Addendum)
[facility]) Hospital Liaison: CIP RN Visit @ 1430  This is a related and covered GIP admission of 12/27/2017 with [facility] diagnosis of Cerebral Atherosclerosis disease per Dr. Tomasa Hosteller. Patient has a OOF DNR in the home. Pt was recent admitted from 12/9-12/12 with recurrent aspiration pneumonia in 3 weeks and d/c home with hospice. Pt back to the ED with LE swelling, productive cough and found to have fever, Leukocytosis 22,000 and BNP 466 lactic acid 6.4, chest x-ray showed hazy bilateral lower airspace disease reflecting residual pneumonia.  Visited patient at bedside. No Family present. Patient was sleeping and did no wake to my voice. VSS.  SpO2 95% on 6Lnc.  Patient has a Foley catheter with amber - tea colored urine.   Medications: Lactated ringers 74m/hr, Pepcid 254mIVPB BID, Unasyn 3g IVPB TID.  Per MD notes: _Severe sepsis secondary to recurrent aspiration pneumonia  Patient on broad-spectrum antibiotics with vancomycin and  cefepime.  Cultures negative so far.  Changed to Unasyn.  Palliative medicine reconsulted to assist further  management.  They have seen the patient.  It appears that  family still wants to return home with hospice care if possible. - No rehospitalization.  _Acute respiratory failure with hypoxia  Secondary to aspiration.  Continue oxygen.  _Acute metabolic encephalopathy/delirium  Likely secondary to acute infection and hospital stay.  Patient   with a known history of bipolar disorder as well.  Steroids also  contributing.  Unknown if patient has cognitive impairment as  Well.  _Chronic atrial fibrillation with sick sinus syndrome status  post pacemaker   PVCs noted.  Patient noted to be on Eliquis.  Cardiology has seen the patient.    Goals of Care: DNR. Family would like to bring patient home. Palliative Medical Team met with family on 12/29/17 and discussed GOC. They understand patient is at risk for recurring  aspiration and recurrent pneumonia. They want to work with hospice to keep patient at home and avoid rehospitalization.   Communication with PCG: Spoke with wife, ShEnid Derrynd son, BoBlaideny phone and discussed [place] described above.  Communition with IDG: Team updated on possible discharge and review of GOC as described above.  Discharge Planning: Plan is for patient to return home at discharge with hospice support in home. Please use [facility] for ambulance transport at time of discharge.  Please call with any hospice related questions.  Thank you,  MaFarrel GordonRN, [facility] Hospitaliaison (listed on [phone]

## 2017-12-30 NOTE — Progress Notes (Addendum)
Progress Note  Patient Name: Chad [name] Date of Encounter: 12/30/2017  Primary Cardiologist: Kirk Ruths, MD   Subjective   Resting. Somnolent. Earlier notes indicate that pt was confused and agitated and given ativan. Wearing mittens.   Inpatient Medications    Scheduled Meds: . apixaban  5 mg Oral BID  . atorvastatin  20 mg Oral q1800  . hydrocortisone sod succinate (SOLU-CORTEF) inj  50 mg Intravenous Q8H  . ipratropium  0.5 mg Nebulization TID  . potassium chloride  40 mEq Oral Daily  . sodium chloride flush  3 mL Intravenous Q12H   Continuous Infusions: . azithromycin Stopped (12/29/17 1700)  . ceFEPime (MAXIPIME) IV Stopped (12/30/17 0045)  . famotidine (PEPCID) IV Stopped (12/29/17 2231)  . lactated ringers 50 mL/hr at 12/30/17 0400  . vancomycin 1,750 mg (12/29/17 0335)   PRN Meds: fluticasone, LORazepam, naphazoline-glycerin, ondansetron (ZOFRAN) IV, polyethylene glycol powder   Vital Signs    Vitals:   12/29/17 2137 12/30/17 0426 12/30/17 0427 12/30/17 0817  BP: 124/68 (!) 150/79    Pulse: 71 81    Resp: 20 18    Temp: 97.8 F (36.6 C) 97.8 F (36.6 C)    TempSrc: Oral Oral    SpO2: 97% (!) 85% 97% 91%    Intake/Output Summary (Last 24 hours) at 12/30/2017 6789 Last data filed at 12/30/2017 0400 Gross per 24 hour  Intake 1930.27 ml  Output -  Net 1930.27 ml   There were no vitals filed for this visit.  Telemetry    SR, 1st degree AV block - Personally Reviewed  ECG    Not performed today - Personally Reviewed  Physical Exam   GEN: Elderly WM No acute distress.   Neck: No JVD Cardiac: RRR, 2/6 murmur loudest at LLSB Respiratory: Clear to auscultation bilaterally. GI: Soft, nontender, non-distended  MS: No edema; No deformity. Neuro:  Nonfocal  Psych: Normal affect   Labs    Chemistry Recent Labs  Lab 12/27/17 1356  12/28/17 0345 12/29/17 0410 12/30/17 0352  NA 141   < > 141 142 144  K 4.1   < > 4.0 3.1* 4.0  CL  101   < > 107 110 111  CO2 23  --  25 24 21*  GLUCOSE 103*   < > 128* 99 93  BUN 13   < > 13 12 13   CREATININE 1.43*   < > 1.23 1.18 1.25*  CALCIUM 8.5*  --  7.5* 7.6* 8.1*  PROT 6.6  --  4.8*  --   --   ALBUMIN 1.9*  --  1.3*  --   --   AST 48*  --  32  --   --   ALT 30  --  18  --   --   ALKPHOS 83  --  68  --   --   BILITOT 0.7  --  0.5  --   --   GFRNONAA 45*  --  54* 56* 53*  GFRAA 52*  --  >60 >60 >60  ANIONGAP 17*  --  9 8 12    < > = values in this interval not displayed.     Hematology Recent Labs  Lab 12/28/17 0345 12/29/17 0410 12/30/17 0352  WBC 15.5* 12.6* 14.5*  RBC 3.34* 2.75* 3.28*  HGB 9.4* 7.5* 8.9*  HCT 29.3* 23.7* 28.7*  MCV 87.7 86.2 87.5  MCH 28.1 27.3 27.1  MCHC 32.1 31.6 31.0  RDW 15.1 14.9 15.7*  PLT 198 205 241    Cardiac EnzymesNo results for input(s): TROPONINI in the last 168 hours.  Recent Labs  Lab 12/27/17 1430  TROPIPOC 0.04     BNP Recent Labs  Lab 12/27/17 1359  BNP 466.3*     DDimer No results for input(s): DDIMER in the last 168 hours.   Radiology    No results found.  Cardiac Studies   2D Echo 06/04/17 Study Conclusions  - Left ventricle: The cavity size was normal. There was mild   concentric hypertrophy. Systolic function was normal. The   estimated ejection fraction was in the range of 55% to 60%.   Hypokinesis of the basal-midanteroseptal and inferoseptal   myocardium. Doppler parameters are consistent with a reversible   restrictive pattern, indicative of decreased left ventricular   diastolic compliance and/or increased left atrial pressure (grade   3 diastolic dysfunction). - Aortic valve: Valve mobility was restricted. Transvalvular   velocity was within the normal range. There was no stenosis.   There was no regurgitation. Valve area (VTI): 2.75 cm^2. Valve   area (Vmax): 3.08 cm^2. Valve area (Vmean): 3.6 cm^2. - Mitral valve: Calcified annulus. Transvalvular velocity was   within the normal range.  There was no evidence for stenosis.   There was mild regurgitation. - Left atrium: The atrium was mildly dilated. - Right ventricle: Systolic function was normal. - Tricuspid valve: There was moderate-severe regurgitation. - Pulmonic valve: There was moderate regurgitation. - Pulmonary arteries: Systolic pressure was mildly increased. PA   peak pressure: 45 mm Hg (S).  Impressions:  - Visually, there appears to be severe aortic stenosis. However,   gradients across the aortic valve are consistent with mild aortic   stenosis. Consider repeating limited echo and looking at right   parasternal views, TEE or cath if clinically indicated  Patient Profile     Chad [name] is a 84 y.o. male with a hx of paroxysmal atrial fibrillation, SSS, prior CVA, hypertension, COPD, recurrent aspiration pneumonia, and GERD who is being seen today for the evaluation of wide complex tachycardia at the request of Dr. Maren Beach.  Assessment & Plan    1. Arrhthymias: noted to have a WCT earlier during admit. Also h/o afib. Currently NSR on tele. 1st degree AV block. HR controlled. Continue Eliquis for afib.   2. Aortic Stenosis: mild by echo 05/2017.   3. Recurrent Aspiration PNAs: management per primary.  4. COPD: on chronic supplemental O2.   5. Goals of Care: palliative care following. Plan is to d/c home later with Hospice Care.    For questions or updates, please contact [place] Please consult [email or web address] for contact info under        Signed, Lyda Jester, PA-C  12/30/2017, 8:32 AM     .Attending Note:   The patient was seen and examined.  Agree with assessment and plan as noted above.  Changes made to the above note as needed.  Patient seen and independently examined with Ellen Henri, PA .   We discussed all aspects of the encounter. I agree with the assessment and plan as stated above.  1.  Atrial fibrillation: The patient is currently in normal sinus rhythm but  had some episodes of atrial fibrillation.  Continue Eliquis.  2.  Mild aortic stenosis: He has severe dementia.  He is not a candidate for any further procedures.  3.  Recurrent aspiration pneumonia: Plans per primary team  4.  COPD: He is on chronic supplemental home  O2.  He has been seen by palliative care. Continue current medications.  We really do not have anything further to add to his care.  We will sign off.   I have spent a total of 40 minutes with patient reviewing hospital  notes , telemetry, EKGs, labs and examining patient as well as establishing an assessment and plan that was discussed with the patient. > 50% of time was spent in direct patient care.  [facility] will sign off.   Medication Recommendations:   Continue medical therapy Other recommendations (labs, testing, etc): Further plans per internal medicine team Follow up as an outpatient: Follow-up with his primary medical doctor.  He may see cardiology as needed   Ramond Dial., MD, [facility] 12/30/2017, 10:55 AM [address],  [place] Pager [phone]

## 2017-12-30 NOTE — Progress Notes (Addendum)
Discussed patient's goals with Hospice RN, she will see patient today and discuss a plan for providing more support at home to avoid continued hospitalization. See consult note dated 12/22 by PMT NP for complete discussion details. I have added on Roxanol for dyspnea, pain and agitation. Can also use sublingual Ativan concentrate. It appears that he did not have rescue opioids or anxiolytics in the home -these should be used as needed for his comfort.    Lane Hacker, DO Palliative Medicine [phone]

## 2017-12-31 LAB — CBC
HCT: 25.2 % — ABNORMAL LOW (ref 39.0–52.0)
Hemoglobin: 8.2 g/dL — ABNORMAL LOW (ref 13.0–17.0)
MCH: 28.7 pg (ref 26.0–34.0)
MCHC: 32.5 g/dL (ref 30.0–36.0)
MCV: 88.1 fL (ref 80.0–100.0)
Platelets: 225 10*3/uL (ref 150–400)
RBC: 2.86 MIL/uL — ABNORMAL LOW (ref 4.22–5.81)
RDW: 15.8 % — ABNORMAL HIGH (ref 11.5–15.5)
WBC: 13.9 10*3/uL — ABNORMAL HIGH (ref 4.0–10.5)
nRBC: 0 % (ref 0.0–0.2)

## 2017-12-31 LAB — GLUCOSE, CAPILLARY
Glucose-Capillary: 97 mg/dL (ref 70–99)
Glucose-Capillary: 80 mg/dL (ref 70–99)

## 2017-12-31 LAB — BASIC METABOLIC PANEL
Anion gap: 12 (ref 5–15)
BUN: 13 mg/dL (ref 8–23)
CALCIUM: 7.8 mg/dL — AB (ref 8.9–10.3)
CO2: 23 mmol/L (ref 22–32)
Chloride: 110 mmol/L (ref 98–111)
Creatinine, Ser: 1.21 mg/dL (ref 0.61–1.24)
GFR calc non Af Amer: 55 mL/min — ABNORMAL LOW (ref >60)
Glucose, Bld: 103 mg/dL — ABNORMAL HIGH (ref 70–99)
Potassium: 3.6 mmol/L (ref 3.5–5.1)
Sodium: 145 mmol/L (ref 135–145)

## 2017-12-31 MED ORDER — GUAIFENESIN-DM 100-10 MG/5ML PO SYRP
5.0000 mL | ORAL_SOLUTION | ORAL | Status: DC | PRN
  Administered 2017-12-31: 5 mL via ORAL
  Filled 2017-12-31: qty 5

## 2017-12-31 MED ORDER — ESCITALOPRAM OXALATE 10 MG PO TABS
10.0000 mg | ORAL_TABLET | Freq: Every day | ORAL | Status: DC
  Administered 2018-01-02: 10 mg via ORAL
  Filled 2017-12-31 (×2): qty 1

## 2017-12-31 MED ORDER — HYDROCORTISONE NA SUCCINATE PF 100 MG IJ SOLR
25.0000 mg | Freq: Two times a day (BID) | INTRAMUSCULAR | Status: AC
  Administered 2017-12-31 – 2018-01-01 (×2): 25 mg via INTRAVENOUS
  Filled 2017-12-31 (×2): qty 2

## 2017-12-31 MED ORDER — MIRTAZAPINE 15 MG PO TABS
15.0000 mg | ORAL_TABLET | Freq: Every day | ORAL | Status: DC
  Administered 2017-12-31: 15 mg via ORAL
  Filled 2017-12-31: qty 1

## 2017-12-31 NOTE — Progress Notes (Signed)
[facility] and [facility]) Hospital Liaison: GIP RN Visit @ 1130  This is a related and covered GIP admission of 12/11 with [facility] diagnosis of Cerebral Atherosclerosis disease per Dr. Tomasa Hosteller. Patient has a OOF DNR in the home. Pt was recent admitted from 12/9-12/12 with recurrent aspiration pneumonia in 3 weeks and d/c home with hospice. Pt back to the ED with LE swelling, productive cough and found to have fever, Leukocytosis 22,000 and BNP 466 lactic acid 6.4, chest x-ray showed hazy bilateral lower airspace disease reflecting residual pneumonia.  Visited pt at the bedside.  He was asleep, did not arouse when I came in.  Mittens noted on hands.    V/S: 171/87, 98.7 oral, 96, RR 18, SPO2 96% 6 lpm HFNC.  Pertinent labs:  WBCs 13.9, RBCs 2.86, hgb 8.2, HCT 25.2  Pt switched from [place] 3lpm to 6 HFNC on 12/23 around 1pm, after his SPO2 was noted to be 82% with adventitious breath sounds.  -Sepsis secondary to aspiration PNA- WBCs down slightly from yesterday 14.5 > 13.9. Scheduled to finish his IV abx today, Unasyn 3g q8 (two more doses today).    -Acute respiratory failure- secondary to aspiration PNA, oxygen requirements increased during the last 24 hours from 3 lpm, to 6 lpm HFNC.  Continuous infusions, IVs and PRNs- LR at [facility], pepcid 20mg  IV BID, unasyn 3g IV TID.  Last doses of maxipime and zithromax IV yesterday at 1pm.  Discharging planning: Will go back to home with [facility] support.  Continued education on anticipated aspirations given his chronic dysphagia.  Plan to change to oral abx on 12/25.  Updated IDT.  Spoke with Geni Bers (son), updated on plan.  When the patient is ready for discharge, please use [facility] as they contract this service for [facility].  Thank you, Venia Carbon BSN, [place] [facility] Liaison (listed in [place]) [phone]

## 2017-12-31 NOTE — Progress Notes (Signed)
[facility] PROGRESS NOTE  Chad [name] ASN:[id number] DOB: Apr 30, 1931 DOA: 12/27/2017  PCP: Cassandria Anger, MD  Brief History/Interval Summary: 84 year old male with a past medical history of paroxysmal atrial fibrillation, sick sinus syndrome status post pacemaker, essential hypertension, history of stroke, protein calorie malnutrition, chronic kidney disease stage IV, dyslipidemia, remote history of throat cancer and a recent hospitalization for aspiration pneumonia.  Patient was discharged home with hospice services.  Brought back into the emergency department with cough fever leukocytosis.  Again found to have bilateral lower airspace disease.  Placed on antibiotics.  Critical care service was consulted.  Palliative medicine consulted.  Reason for Visit: Recurrent aspiration pneumonia.  Consultants: Palliative medicine.  Cardiology.  Procedures: None  Antibiotics: Comycin and cefepime and azithromycin. Vancomycin cefepime discontinued on 12/23.  Unasyn initiated.  Subjective/Interval History: Patient noted to be more responsive this morning.  Still somewhat agitated.  Noted to have mittens on his hands.    ROS: Unable to do due to his agitation  Objective:  Vital Signs  Vitals:   12/30/17 2204 12/31/17 0515 12/31/17 0529 12/31/17 0538  BP: (!) 142/71 (!) 171/87 (!) 152/85 (!) 151/68  Pulse: 75 82 85   Resp: 17 18    Temp: 98.7 F (37.1 C) 98.7 F (37.1 C)    TempSrc: Oral Oral    SpO2: 95% 96%      Intake/Output Summary (Last 24 hours) at 12/31/2017 1213 Last data filed at 12/31/2017 0600 Gross per 24 hour  Intake 1482.1 ml  Output 700 ml  Net 782.1 ml   There were no vitals filed for this visit.  General appearance: Patient noted to be distracted.  bit more responsive today compared to yesterday. Resp: Coarse breath sounds bilaterally.  Better air entry at the bases.  Few crackles at the bases.  No wheezing or rhonchi.   Cardio: S1-S2 is  normal regular.  No S3-S4 GI: Abdomen noted to be soft.  Nontender nondistended. Extremities: No edema Neurologic: Less agitated today compared to yesterday.  Responds to certain questions.  Moving all his extremities.    Lab Results:  Data Reviewed: I have personally reviewed following labs and imaging studies  CBC: Recent Labs  Lab 12/27/17 1356 12/27/17 1431 12/28/17 0345 12/29/17 0410 12/30/17 0352 12/31/17 0622  WBC 22.0*  --  15.5* 12.6* 14.5* 13.9*  NEUTROABS 19.0*  --   --  11.4* 12.8*  --   HGB 10.8* 11.6* 9.4* 7.5* 8.9* 8.2*  HCT 35.0* 34.0* 29.3* 23.7* 28.7* 25.2*  MCV 91.1  --  87.7 86.2 87.5 88.1  PLT 308  --  198 205 241 193    Basic Metabolic Panel: Recent Labs  Lab 12/27/17 1356 12/27/17 1431 12/27/17 1641 12/28/17 0345 12/29/17 0410 12/30/17 0352 12/31/17 0622  NA 141 138  --  141 142 144 145  K 4.1 4.0  --  4.0 3.1* 4.0 3.6  CL 101 103  --  107 110 111 110  CO2 23  --   --  25 24 21* 23  GLUCOSE 103* 96  --  128* 99 93 103*  BUN 13 15  --  13 12 13 13   CREATININE 1.43* 1.20  --  1.23 1.18 1.25* 1.21  CALCIUM 8.5*  --   --  7.5* 7.6* 8.1* 7.8*  MG  --   --  2.0  --   --   --   --     GFR: CrCl cannot be calculated (Unknown  ideal weight.).  Liver Function Tests: Recent Labs  Lab 12/27/17 1356 12/28/17 0345  AST 48* 32  ALT 30 18  ALKPHOS 83 68  BILITOT 0.7 0.5  PROT 6.6 4.8*  ALBUMIN 1.9* 1.3*    CBG: Recent Labs  Lab 12/30/17 0754 12/30/17 2204 12/31/17 0833  GLUCAP 79 121* 97    Recent Results (from the past 240 hour(s))  Blood Culture (routine x 2)     Status: None (Preliminary result)   Collection Time: 12/27/17  2:10 PM  Result Value Ref Range Status   Specimen Description BLOOD RIGHT ARM  Final   Special Requests   Final    BOTTLES DRAWN AEROBIC AND ANAEROBIC Blood Culture results may not be optimal due to an inadequate volume of blood received in culture bottles   Culture   Final    NO GROWTH 4 DAYS Performed at  [facility], [place] [address] [place], [place] [address]    Report Status PENDING  Incomplete  Blood Culture (routine x 2)     Status: None (Preliminary result)   Collection Time: 12/27/17  2:39 PM  Result Value Ref Range Status   Specimen Description BLOOD LEFT ARM  Final   Special Requests   Final    BOTTLES DRAWN AEROBIC ONLY Blood Culture results may not be optimal due to an inadequate volume of blood received in culture bottles   Culture   Final    NO GROWTH 4 DAYS Performed at [facility], [place] [address] [place], [place] [address]    Report Status PENDING  Incomplete  Urine culture     Status: None   Collection Time: 12/27/17  6:39 PM  Result Value Ref Range Status   Specimen Description URINE, CATHETERIZED  Final   Special Requests NONE  Final   Culture   Final    NO GROWTH Performed at [facility], [address], [place], [place] [address]    Report Status 12/28/2017 FINAL  Final      Radiology Studies: No results found.   Medications:  Scheduled: . apixaban  5 mg Oral BID  . atorvastatin  20 mg Oral q1800  . hydrocortisone sod succinate (SOLU-CORTEF) inj  25 mg Intravenous Q12H  . potassium chloride  40 mEq Oral Daily  . sodium chloride flush  3 mL Intravenous Q12H   Continuous: . ampicillin-sulbactam (UNASYN) IV 3 g (12/31/17 0536)  . famotidine (PEPCID) IV 20 mg (12/31/17 1009)  . lactated ringers Stopped (12/31/17 0533)   BHA:[id number], ipratropium-albuterol, LORazepam, morphine CONCENTRATE, naphazoline-glycerin, ondansetron (ZOFRAN) IV, polyethylene glycol powder    Assessment/Plan:  Severe sepsis secondary to recurrent aspiration pneumonia Patient on broad-spectrum antibiotics with vancomycin and cefepime.  Cultures have been negative.  Patient was changed over to Unasyn on 12/23.  WBC stable.  He is afebrile.  Respiratory status is stable.  Continue IV antibiotics for 1 more day.  Change to oral antibiotics tomorrow. Patient  was admitted with aspiration pneumonia during his previous hospitalization as well.  He was discharged home with hospice services.  Palliative medicine reconsulted to assist further management.  They have seen the patient.  It appears that family wants patient to return home with hospice care if possible.  No rehospitalization.  Getting back on Solu-Cortef.  Acute respiratory failure with hypoxia Secondary to aspiration.  Continue oxygen.  Seems to be stable.  Acute metabolic encephalopathy/delirium Likely secondary to acute infection and hospital stay.  Patient with a known  history of bipolar disorder as well.  Steroids also contributing.  Likely patient has some form of cognitive impairment as well.  Mentation appears to be improving.  Continue to monitor.  Chronic atrial fibrillation with sick sinus syndrome status post pacemaker Patient noted to have wide-complex tachycardia.  Seems to have resolved.  PVCs noted.  Patient noted to be on Eliquis.  Cardiology has seen the patient.  No further interventions planned.  Normocytic anemia Drop in hemoglobin is likely dilutional.  No evidence of overt bleeding.  Hemoglobin is stable.  History of COPD Continue oxygen.  Seems to be stable.  History of stroke Stable  History of bipolar disorder Resume his home medications.  Moderate protein calorie malnutrition Nutritional supplements.  Pressure injury to left toe right midfoot Wound care  Chronic kidney disease stage IV with hyperkalemia Renal function stable.  Potassium normal.  On daily potassium supplements.  DVT Prophylaxis: Apixaban    Code Status: DNR Family Communication: No family at bedside Disposition Plan: Management as outlined above.  Prognosis remains guarded.  Anticipate change to oral antibiotics tomorrow and hopefully discharge soon afterwards.    LOS: 4 days   [facility] Pager [phone] 12/31/2017, 12:13 PM  If 7PM-7AM, please contact  night-coverage at [email or web address], password [facility]

## 2018-01-01 LAB — CBC
HCT: 29.6 % — ABNORMAL LOW (ref 39.0–52.0)
Hemoglobin: 9.6 g/dL — ABNORMAL LOW (ref 13.0–17.0)
MCH: 27.9 pg (ref 26.0–34.0)
MCHC: 32.4 g/dL (ref 30.0–36.0)
MCV: 86 fL (ref 80.0–100.0)
PLATELETS: 206 10*3/uL (ref 150–400)
RBC: 3.44 MIL/uL — ABNORMAL LOW (ref 4.22–5.81)
RDW: 16.1 % — ABNORMAL HIGH (ref 11.5–15.5)
WBC: 15.1 10*3/uL — ABNORMAL HIGH (ref 4.0–10.5)
nRBC: 0 % (ref 0.0–0.2)

## 2018-01-01 LAB — BASIC METABOLIC PANEL
Anion gap: 11 (ref 5–15)
BUN: 10 mg/dL (ref 8–23)
CO2: 25 mmol/L (ref 22–32)
Calcium: 8 mg/dL — ABNORMAL LOW (ref 8.9–10.3)
Chloride: 109 mmol/L (ref 98–111)
Creatinine, Ser: 1.11 mg/dL (ref 0.61–1.24)
Glucose, Bld: 93 mg/dL (ref 70–99)
Potassium: 3.6 mmol/L (ref 3.5–5.1)
Sodium: 145 mmol/L (ref 135–145)

## 2018-01-01 LAB — CULTURE, BLOOD (ROUTINE X 2)
Culture: NO GROWTH
Culture: NO GROWTH

## 2018-01-01 LAB — GLUCOSE, CAPILLARY: Glucose-Capillary: 96 mg/dL (ref 70–99)

## 2018-01-01 MED ORDER — HALOPERIDOL LACTATE 5 MG/ML IJ SOLN: 2.5000 mg | Freq: Once | INTRAMUSCULAR | Status: DC

## 2018-01-01 MED ORDER — QUETIAPINE FUMARATE 25 MG PO TABS
25.0000 mg | ORAL_TABLET | Freq: Every day | ORAL | Status: DC
  Administered 2018-01-01: 25 mg via ORAL
  Filled 2018-01-01: qty 1

## 2018-01-01 MED ORDER — AMOXICILLIN-POT CLAVULANATE 875-125 MG PO TABS
1.0000 | ORAL_TABLET | Freq: Two times a day (BID) | ORAL | Status: DC
  Administered 2018-01-01 – 2018-01-02 (×3): 1 via ORAL
  Filled 2018-01-01 (×3): qty 1

## 2018-01-01 NOTE — Progress Notes (Signed)
[facility] PROGRESS NOTE  Chad [name] KZL:[id number] DOB: 06/17/1931 DOA: 12/27/2017  PCP: Cassandria Anger, MD  Brief History/Interval Summary: 84 year old male with a past medical history of paroxysmal atrial fibrillation, sick sinus syndrome status post pacemaker, essential hypertension, history of stroke, protein calorie malnutrition, chronic kidney disease stage IV, dyslipidemia, remote history of throat cancer and a recent hospitalization for aspiration pneumonia.  Patient was discharged home with hospice services.  Brought back into the emergency department with cough fever leukocytosis.  Again found to have bilateral lower airspace disease.  Placed on antibiotics.  Critical care service was consulted.  Palliative medicine consulted.  Reason for Visit: Recurrent aspiration pneumonia.  Consultants: Palliative medicine.  Cardiology.  Procedures: None  Antibiotics: Comycin and cefepime and azithromycin. Vancomycin cefepime discontinued on 12/23.  Unasyn initiated.  Subjective/Interval History: Still noted to be confused but not as agitated.  Still has mittens on his hands.     ROS: Unable to do due to his agitation  Objective:  Vital Signs  Vitals:   12/31/17 0538 12/31/17 1631 12/31/17 2016 01/01/18 0446  BP: (!) 151/68 (!) 161/82 (!) 149/80 (!) 158/100  Pulse:  91 83 90  Resp:  20 20 20   Temp:  98.8 F (37.1 C) (!) 97.4 F (36.3 C) 97.6 F (36.4 C)  TempSrc:  Oral Oral Oral  SpO2:  93% 96% 100%    Intake/Output Summary (Last 24 hours) at 01/01/2018 1141 Last data filed at 01/01/2018 0518 Gross per 24 hour  Intake 352.68 ml  Output 1900 ml  Net -1547.32 ml   There were no vitals filed for this visit.  General appearance: Continues to be distracted.  Perhaps less agitated. Resp: Coarse breath sounds bilaterally.  Normal effort noted.  Improving air entry at the bases.  No wheezing or rhonchi.  Few crackles at the bases. Cardio: S1-S2 is normal  regular.  No S3-S4 GI: Abdomen is soft.  Nontender nondistended Extremities: No edema Neurologic: Less agitated today.  Moving all his extremities  Lab Results:  Data Reviewed: I have personally reviewed following labs and imaging studies  CBC: Recent Labs  Lab 12/27/17 1356  12/28/17 0345 12/29/17 0410 12/30/17 0352 12/31/17 0622 01/01/18 0419  WBC 22.0*  --  15.5* 12.6* 14.5* 13.9* 15.1*  NEUTROABS 19.0*  --   --  11.4* 12.8*  --   --   HGB 10.8*   < > 9.4* 7.5* 8.9* 8.2* 9.6*  HCT 35.0*   < > 29.3* 23.7* 28.7* 25.2* 29.6*  MCV 91.1  --  87.7 86.2 87.5 88.1 86.0  PLT 308  --  198 205 241 225 206   < > = values in this interval not displayed.    Basic Metabolic Panel: Recent Labs  Lab 12/27/17 1641 12/28/17 0345 12/29/17 0410 12/30/17 0352 12/31/17 0622 01/01/18 0419  NA  --  141 142 144 145 145  K  --  4.0 3.1* 4.0 3.6 3.6  CL  --  107 110 111 110 109  CO2  --  25 24 21* 23 25  GLUCOSE  --  128* 99 93 103* 93  BUN  --  13 12 13 13 10   CREATININE  --  1.23 1.18 1.25* 1.21 1.11  CALCIUM  --  7.5* 7.6* 8.1* 7.8* 8.0*  MG 2.0  --   --   --   --   --     GFR: CrCl cannot be calculated (Unknown ideal weight.).  Liver Function  Tests: Recent Labs  Lab 12/27/17 1356 12/28/17 0345  AST 48* 32  ALT 30 18  ALKPHOS 83 68  BILITOT 0.7 0.5  PROT 6.6 4.8*  ALBUMIN 1.9* 1.3*    CBG: Recent Labs  Lab 12/30/17 0754 12/30/17 2204 12/31/17 0833 12/31/17 2203 01/01/18 0810  GLUCAP 79 121* 97 80 96    Recent Results (from the past 240 hour(s))  Blood Culture (routine x 2)     Status: None   Collection Time: 12/27/17  2:10 PM  Result Value Ref Range Status   Specimen Description BLOOD RIGHT ARM  Final   Special Requests   Final    BOTTLES DRAWN AEROBIC AND ANAEROBIC Blood Culture results may not be optimal due to an inadequate volume of blood received in culture bottles Performed at [facility] [address], [place], [place] [address]    Culture  NO GROWTH 5 DAYS  Final   Report Status 01/01/2018 FINAL  Final  Blood Culture (routine x 2)     Status: None   Collection Time: 12/27/17  2:39 PM  Result Value Ref Range Status   Specimen Description BLOOD LEFT ARM  Final   Special Requests   Final    BOTTLES DRAWN AEROBIC ONLY Blood Culture results may not be optimal due to an inadequate volume of blood received in culture bottles Performed at [facility], [place] [address], [place], [place] [address]    Culture NO GROWTH 5 DAYS  Final   Report Status 01/01/2018 FINAL  Final  Urine culture     Status: None   Collection Time: 12/27/17  6:39 PM  Result Value Ref Range Status   Specimen Description URINE, CATHETERIZED  Final   Special Requests NONE  Final   Culture   Final    NO GROWTH Performed at [facility], [place] [address], [place], [place] [address]    Report Status 12/28/2017 FINAL  Final      Radiology Studies: No results found.   Medications:  Scheduled: . apixaban  5 mg Oral BID  . atorvastatin  20 mg Oral q1800  . escitalopram  10 mg Oral Daily  . mirtazapine  15 mg Oral QHS  . potassium chloride  40 mEq Oral Daily  . sodium chloride flush  3 mL Intravenous Q12H   Continuous: . ampicillin-sulbactam (UNASYN) IV 3 g (01/01/18 0518)  . famotidine (PEPCID) IV 20 mg (01/01/18 1041)  . lactated ringers Stopped (01/01/18 0516)   NLZ:JQBHALPFXTK, guaiFENesin-dextromethorphan, ipratropium-albuterol, LORazepam, morphine CONCENTRATE, naphazoline-glycerin, ondansetron (ZOFRAN) IV, polyethylene glycol powder    Assessment/Plan:  Severe sepsis secondary to recurrent aspiration pneumonia Patient on broad-spectrum antibiotics with vancomycin and cefepime.  Cultures have been negative.  Patient was changed over to Unasyn on 12/23.  Respiratory status appears to be stable.  Still on high flow oxygen.  Discussed with nursing staff.  Attempt to wean him down.  WBC remains elevated although he is afebrile.   Leukocytosis could be in part due to steroids.  He is on a dysphagia diet.  Change to Augmentin. Patient was admitted with aspiration pneumonia during his previous hospitalization as well.  He was discharged home with hospice services.  Palliative medicine reconsulted to assist further management.  They have seen the patient.  It appears that family wants patient to return home with hospice care if possible.  No rehospitalization.  Solu-Cortef has been weaned down and discontinued.  Acute respiratory failure with hypoxia Secondary to aspiration.  Continue oxygen.  Seems to be stable.  Wean down oxygen.  Acute metabolic encephalopathy/delirium Likely secondary to acute infection and hospital stay.  Patient with a known history of bipolar disorder as well.  Steroids also contributed.  Likely patient has some form of cognitive impairment as well.  Mentation appears to be improving.  Continue to monitor.  Chronic atrial fibrillation with sick sinus syndrome status post pacemaker Patient noted to have wide-complex tachycardia.  Seems to have resolved.  PVCs noted.  Patient noted to be on Eliquis.  Cardiology has seen the patient.  No further interventions planned.  Normocytic anemia Drop in hemoglobin is likely dilutional.  No evidence of overt bleeding.  Hemoglobin is stable.  History of COPD Seems to be stable.  History of stroke Stable  History of bipolar disorder Resume his home medications.  Moderate protein calorie malnutrition Nutritional supplements.  Pressure injury to left toe right midfoot Wound care  Chronic kidney disease stage IV with hyperkalemia Renal function stable.  Potassium normal.  On daily potassium supplements.  DVT Prophylaxis: Apixaban    Code Status: DNR Family Communication: No family at bedside Disposition Plan: Management as outlined above.  Change to oral antibiotics today.  Cut down on oxygen.  Hopefully discharge home tomorrow.      LOS: 5 days    [facility] Pager [phone] 01/01/2018, 11:41 AM  If 7PM-7AM, please contact night-coverage at [email or web address], password [facility]

## 2018-01-01 NOTE — Progress Notes (Signed)
[facility] On-call SW completed visit with patient. Patient was found in bed. He was awake, appearing to be restless as he constantly tried to get up but was unable to. He gave SW eye contact when prompted but made nonsensical verbalizations. He did not appear to be in any pain or discomfort. No family was present at the bedside. SW consulted with his nurse, Herbert Spires, Therapist, sports. She advised that patient refused all medications this morning. She stated that although he has not been compliant with his 02, his 02 saturation have remained within normal limits. There are no plans for discharge today. SW encouraged the nurse to please call [facility] with any concerns or changes in patient's condition.   Monica P. Randlett, [place] [facility]

## 2018-01-02 ENCOUNTER — Other Ambulatory Visit: Payer: Self-pay | Admitting: Cardiology

## 2018-01-02 ENCOUNTER — Other Ambulatory Visit: Payer: Self-pay

## 2018-01-02 LAB — GLUCOSE, CAPILLARY
Glucose-Capillary: 79 mg/dL (ref 70–99)
Glucose-Capillary: 77 mg/dL (ref 70–99)

## 2018-01-02 MED ORDER — MORPHINE SULFATE (CONCENTRATE) 10 MG/0.5ML PO SOLN: 10.0000 mg | ORAL | 0 refills | Status: AC | PRN

## 2018-01-02 MED ORDER — QUETIAPINE FUMARATE 25 MG PO TABS: 25.0000 mg | ORAL_TABLET | Freq: Every day | ORAL | 0 refills | Status: AC

## 2018-01-02 MED ORDER — AMOXICILLIN-POT CLAVULANATE 875-125 MG PO TABS: 1.0000 | ORAL_TABLET | Freq: Two times a day (BID) | ORAL | 0 refills | Status: AC

## 2018-01-02 MED ORDER — LORAZEPAM 2 MG/ML PO CONC: 1.0000 mg | ORAL | 0 refills | Status: AC | PRN

## 2018-01-02 NOTE — Progress Notes (Signed)
[place] [facility]W-[facility] ([facility]) GIP RN Visit at 0900  This is a related and covered GIP admission of 12/27/2017 with [facility] of Cerebral Atherosclerosis disease per Dr. Tomasa Hosteller. Patient has out of facility DNR in the home. Patient was recently admitted from 12/16/17-12/19/17 with recurrent aspiration pneumonia and was discharged home with hospice. Pt returned to ED with LE swelling, productive cough and found to have fever, Leukocytosis 22,000, BNP 466 and lactic acid 6.4. Chest x-ray showed hazy bilateral airspace disease reflecting residual pneumonia.  Visited with patient and wife at the bedside. Pt reports he is not having any pain nor difficulty breathing. He appears comfortable. Mittens remain on hands. His wife and I discussed returning home when he is medically cleared. We talked about patient not eating yesterday or today. Pt admits he is not hungry though he does request a coke. Wife and I discussed allowing patient to eat when he desires to eat, knowing he will likely aspirate and not forcing him to eat if he does not desire. Conversation included that he can be managed at home with hospice care and the plan is for no rehospitalization.   Discussed discharge with Dr. [place] Pink who is planning discharge today. Dr. [place] Pink has signed DNR and written for Morphine, Ativan, Seroquel and Augmentin. I have ordered O2, suction and nebulizers to be delivered to the home. Pt will also return home with foley catheter in place due to urinary retention.  V/S: T 98.6, BP 166/85 (108), HR 85, SpO2 95% on 3 lpm [place]  Pertinent labs: (01/01/18) WBC 15.1, RBC 3.44, Hgb 9.6, Hct 29.6  Continuous medications include: Augmentin 875-125 mg per tablet, 1 tablet every 12 hours, Pepcid 20 mg IV every 12 hours and LR at 10 cc/hr. No prn medications have been administered.  Discharge planning: Plan is to return home today with hospice services. Please call [facility] at [phone] at time of  discharge for transportation. Vida Roller, RNCM aware. Wife will call [place], bedside RN with window of time time [place] arranges for delivery of equipment and then will call again once equipment is delivered.  Communication with [facility]: as above  Communication with [facility]: updated Hall Busing, [facility] primary case manager by phone  Please call with any hospice related questions or concerns.  Thank you, Margaretmary Eddy, RN, [facility] Liaison [phone]  [place] are on AMION.

## 2018-01-02 NOTE — Progress Notes (Signed)
Pt already active with [facility] prior to admission. He is returning home with resumption on services. Olivia Mackie with [facility] aware. Pt also discharging on home oxygen. CM received phone call from pts son that oxygen is delivered to the home. CM called and arranged transport via [facility]. Transport forms on the chart. Bedside RN updated.

## 2018-01-02 NOTE — Plan of Care (Signed)
  Problem: Education: Goal: Knowledge of General Education information will improve Description Including pain rating scale, medication(s)/side effects and non-pharmacologic comfort measures Outcome: Not Progressing Note:  POC reviewed with pt; some memory impairment- unsure how much pt. Understands.

## 2018-01-02 NOTE — Progress Notes (Signed)
Nsg Discharge Note  Admit Date:  12/27/2017 Discharge date: 01/02/2018   FREDRIK MOGEL to be D/C'd Home per MD order.  AVS co mpleted.   Discharge Medication: Allergies as of 01/02/2018      Reactions   Dyazide [hydrochlorothiazide W-triamterene] Other (See Comments)   Caused hyponatremia 04/2012   Lisinopril Cough      Medication List    STOP taking these medications   amoxicillin-clavulanate 500-125 MG tablet Commonly known as:  AUGMENTIN Replaced by:  amoxicillin-clavulanate 875-125 MG tablet   apixaban 2.5 MG Tabs tablet Commonly known as:  ELIQUIS   aspirin EC 81 MG tablet   atorvastatin 40 MG tablet Commonly known as:  LIPITOR   cholecalciferol 1000 units tablet Commonly known as:  VITAMIN D   clidinium-chlordiazePOXIDE 5-2.5 MG capsule Commonly known as:  LIBRAX   cyanocobalamin 1000 MCG tablet   escitalopram 10 MG tablet Commonly known as:  LEXAPRO   famotidine 20 MG tablet Commonly known as:  PEPCID   fluticasone 50 MCG/ACT nasal spray Commonly known as:  FLONASE   gabapentin 300 MG capsule Commonly known as:  NEURONTIN   mirtazapine 15 MG tablet Commonly known as:  REMERON   multivitamin-lutein Caps capsule   pantoprazole 40 MG tablet Commonly known as:  PROTONIX     TAKE these medications   acetaminophen 325 MG tablet Commonly known as:  TYLENOL Take 650 mg by mouth every 6 (six) hours as needed for headache (pain).   amoxicillin-clavulanate 875-125 MG tablet Commonly known as:  AUGMENTIN Take 1 tablet by mouth every 12 (twelve) hours for 5 days. Replaces:  amoxicillin-clavulanate 500-125 MG tablet   feeding supplement (ENSURE ENLIVE) Liqd Take 237 mLs by mouth 2 (two) times daily between meals.   feeding supplement (PRO-STAT SUGAR FREE 64) Liqd Take 30 mLs by mouth 2 (two) times daily.   ipratropium 0.02 % nebulizer solution Commonly known as:  ATROVENT Take 2.5 mLs (0.5 mg total) by nebulization 3 (three) times daily. Via  [facility]   LORazepam 2 MG/ML concentrated solution Commonly known as:  ATIVAN Take 0.5 mLs (1 mg total) by mouth every 4 (four) hours as needed for anxiety.   morphine CONCENTRATE 10 MG/0.5ML Soln concentrated solution Take 0.5 mLs (10 mg total) by mouth every 2 (two) hours as needed for severe pain or shortness of breath.   polyethylene glycol powder powder Commonly known as:  GLYCOLAX/MIRALAX Take 17 g by mouth 2 (two) times daily as needed for mild constipation.   QUEtiapine 25 MG tablet Commonly known as:  SEROQUEL Take 1 tablet (25 mg total) by mouth at bedtime.   RESOURCE THICKENUP CLEAR Powd Take 1 g by mouth as needed. What changed:    how much to take  when to take this  additional instructions   senna-docusate 8.6-50 MG tablet Commonly known as:  Senokot-S Take 1 tablet by mouth daily. What changed:    when to take this  reasons to take this   tetrahydrozoline 0.05 % ophthalmic solution Place 1 drop into both eyes daily as needed (dry eyes).       Discharge Assessment: Vitals:   01/02/18 0550 01/02/18 1344  BP: (!) 166/85 (!) 149/99  Pulse: 85 90  Resp:  (!) 22  Temp: 98.6 F (37 C) 98.6 F (37 C)  SpO2: 95% 90%   Skin clean, dry and intact without evidence of skin break down, no evidence of skin tears noted. IV catheter discontinued intact. Site without signs and symptoms  of complications - no redness or edema noted at insertion site, patient denies c/o pain - only slight tenderness at site.  Dressing with slight pressure applied.  D/c Instructions-Education: Discharge instructions given to patient/family  D/c education completed with including follow up instructions, medication list, d/c activities limitations if indicated, with other d/c instructions as indicated by MD -  Patient instructed to return to ED, call 911, or call MD for any changes in condition.  Patient escorted via [facility], and D/C home via [facility]  [place] N Tonia Avino, [place] 01/02/2018 5:39 PM

## 2018-01-02 NOTE — Discharge Instructions (Signed)
Hospice °Hospice is a service that is designed to provide people who are terminally ill and their families with medical, spiritual, and psychological support. Its aim is to improve your quality of life by keeping you as comfortable as possible in the final stages of life. °Who will be my providers when I begin hospice care? °Hospice teams often include: °· A nurse. °· A doctor. The hospice doctor will be available for your care, but you can include your regular doctor or nurse practitioner. °· A social worker. °· A counselor. °· A religious leader (such as a chaplain). °· A dietitian. °· Therapists. °· Trained volunteers who can help with care. °What services does hospice provide? °Hospice services can vary depending on the center or organization. Generally, they include: °· Ways to keep you comfortable, such as: °? Providing care in your home or in a home-like setting. °? Working with your family and friends to help meet your needs. °? Allowing you to enjoy the support of loved ones by receiving much of your basic care from family and friends. °· Pain relief and symptom management. The staff will supply all necessary medicines and equipment so that you can stay comfortable and alert enough to enjoy the company of your friends and family. °· Visits or care from a nurse and doctor. This may include 24-hour on-call services. °· Companionship when you are alone. °· Allowing you and your family to rest. Hospice staff may do light housekeeping, prepare meals, and run errands. °· Counseling. They will make sure your emotional, spiritual, and social needs are being met, as well as those needs of your family members. °· Spiritual care. This will be individualized to meet your needs and your family's needs. It may involve: °? Helping you and your family understand the dying process. °? Helping you say goodbye to your family and friends. °? Performing a specific religious ceremony or ritual. °· Massage. °· Nutrition  therapy. °· Physical and occupational therapy. °· Short-term inpatient care, if something cannot be managed in the home. °· Art or music therapy. °· Bereavement support for grieving family members. °When should hospice care begin? °Most people who use hospice are believed to have less than 6 months to live. °· Your family and health care providers can help you decide when hospice services should begin. °· If you live longer than 6 months but your condition does not improve, your doctor may be able to approve you for continued hospice care. °· If your condition improves, you may discontinue the program. °What should I consider before selecting a program? °Most hospice programs are run by nonprofit, independent organizations. Some are affiliated with hospitals, nursing homes, or home health care agencies. Hospice programs can take place in your home or at a hospice center, hospital, or skilled nursing facility. When choosing a hospice program, ask the following questions: °· What services are available to me? °· What services will be offered to my loved ones? °· How involved will my loved ones be? °· How involved will my health care provider be? °· Who makes up the hospice care team? How are they trained or screened? °· How will my pain and symptoms be managed? °· If my circumstances change, can the services be provided in a different setting, such as my home or in the hospital? °· Is the program reviewed and licensed by the state or certified in some other way? °· What does it cost? Is it covered by insurance? °· If I choose a hospice   center or nursing home, where is the hospice center located? Is it convenient for family and friends? °· If I choose a hospice center or nursing home, can my family and friends visit any time? °· Will you provide emotional and spiritual support? °· Who can my family call with questions? °Where can I learn more about hospice? °You can learn about existing hospice programs in your area  from your health care providers. You can also read more about hospice online. The websites of the following organizations have helpful information: °· National Hospice and Palliative Care Organization (NHPCO): www.nhpco.org °· National Association for Home Care & Hospice (NAHC): www.nahc.org °· Hospice Foundation of America (HFA): www.hospicefoundation.org °· American Cancer Society (ACS): www.cancer.org °· Hospice Net: www.hospicenet.org °· Visiting Nurse Associations of America (VNAA): www.vnaa.org °You may also find more information by contacting the following agencies: °· A local agency on aging. °· Your local United Way chapter. °· Your state's department of health or social services. °Summary °· Hospice is a service that is designed to provide people who are terminally ill and their families with medical, spiritual, and psychological support. °· Hospice aims to improve your quality of life by keeping you as comfortable as possible in the final stages of life. °· Hospice teams often include a doctor, nurse, social worker, counselor, religious leader,dietitian, therapists, and volunteers. °· Hospice care generally includes medicine for symptom management, visits from doctors and nurses, physical and occupational therapy, nutrition counseling, spiritual and emotional counseling, caregiver support, and bereavement support for grieving family members. °· Hospice programs can take place in your home or at a hospice center, hospital, or skilled nursing facility. °This information is not intended to replace advice given to you by your health care provider. Make sure you discuss any questions you have with your health care provider. °Document Released: 04/13/2003 Document Revised: 01/17/2016 Document Reviewed: 01/17/2016 °Elsevier Interactive Patient Education © 2019 [employer] ° °

## 2018-01-02 NOTE — Discharge Summary (Signed)
[facility]  Physician Discharge Summary   Patient ID: Chad [name] MRN: [id number] DOB/AGE: 1931-09-23 84 y.o.  Admit date: 12/27/2017 Discharge date: 01/02/2018  PCP: Cassandria Anger, MD  DISCHARGE DIAGNOSES:  Severe sepsis Recurrent aspiration pneumonia Acute metabolic encephalopathy Chronic atrial fibrillation History of sick sinus syndrome status post pacemaker History of stroke History of bipolar disorder   RECOMMENDATIONS FOR OUTPATIENT FOLLOW UP: 1. Patient being discharged home with hospice.   DISCHARGE CONDITION: poor  Diet recommendation: Comfort feeds as tolerated  Filed Weights   01/02/18 1129  Weight: 71.9 kg    INITIAL HISTORY: 84 year old male with a past medical history of paroxysmal atrial fibrillation, sick sinus syndrome status post pacemaker, essential hypertension, history of stroke, protein calorie malnutrition, chronic kidney disease stage IV, dyslipidemia, remote history of throat cancer and a recent hospitalization for aspiration pneumonia.  Patient was discharged home with hospice services.  Brought back into the emergency department with cough fever leukocytosis.  Again found to have bilateral lower airspace disease.  Placed on antibiotics.  Critical care service was consulted.  Palliative medicine consulted.  Consultations:  Cardiology  Palliative medicine  Procedures:  None   HOSPITAL COURSE:   Severe sepsis secondary to recurrent aspiration pneumonia Patient was admitted to the hospital and placed on broad-spectrum antibiotics.  Cultures were negative.  He was transitioned to Unasyn and then subsequently to Augmentin.  Patient mental status did not improve much.  His oral intake remained poor.  Respiratory status has improved some but he continues to require oxygen.  Hospice will arrange home oxygen.    Goals of care Discussed with patient's wife this morning.  Patient not improving despite maximal treatment.  This  is likely end-of-life.  This was conveyed to the patient's wife.  She understands.  Discontinue all nonessential medications.  Plan is still for the patient to go home with hospice services.  Hospice will arrange for transportation.  Patient will be discharged with prescription for morphine and lorazepam.  Acute respiratory failure with hypoxia Secondary to aspiration.    Will be discharged with oxygen  Acute metabolic encephalopathy/delirium Likely secondary to acute infection and hospital stay.  Patient with a known history of bipolar disorder as well.  Steroids also contributed.  Likely patient has some form of cognitive impairment as well.   Chronic atrial fibrillation with sick sinus syndrome status post pacemaker Patient noted to have wide-complex tachycardia.  Seems to have resolved.  PVCs noted.  Patient noted to be on Eliquis.  Cardiology has seen the patient.  No further interventions planned.  Normocytic anemia Drop in hemoglobin is likely dilutional.  No evidence of overt bleeding.  Hemoglobin is stable.  History of COPD Seems to be stable.  History of stroke Stable  History of bipolar disorder   Moderate protein calorie malnutrition Nutritional supplements as tolerated  Pressure injury to left toe right midfoot Wound care  Chronic kidney disease stage IV with hyperkalemia Renal function stable.    Patient's prognosis is poor.  His life expectancy is likely less than 2 weeks.  Okay for discharge home with hospice.    PERTINENT LABS:  The results of significant diagnostics from this hospitalization (including imaging, microbiology, ancillary and laboratory) are listed below for reference.    Microbiology: Recent Results (from the past 240 hour(s))  Blood Culture (routine x 2)     Status: None   Collection Time: 12/27/17  2:10 PM  Result Value Ref Range Status   Specimen Description BLOOD  RIGHT ARM  Final   Special Requests   Final    BOTTLES DRAWN  AEROBIC AND ANAEROBIC Blood Culture results may not be optimal due to an inadequate volume of blood received in culture bottles Performed at [facility] [address], [place], [place] [address]    Culture NO GROWTH 5 DAYS  Final   Report Status 01/01/2018 FINAL  Final  Blood Culture (routine x 2)     Status: None   Collection Time: 12/27/17  2:39 PM  Result Value Ref Range Status   Specimen Description BLOOD LEFT ARM  Final   Special Requests   Final    BOTTLES DRAWN AEROBIC ONLY Blood Culture results may not be optimal due to an inadequate volume of blood received in culture bottles Performed at [facility], [place] [address] [place], [place] [address]    Culture NO GROWTH 5 DAYS  Final   Report Status 01/01/2018 FINAL  Final  Urine culture     Status: None   Collection Time: 12/27/17  6:39 PM  Result Value Ref Range Status   Specimen Description URINE, CATHETERIZED  Final   Special Requests NONE  Final   Culture   Final    NO GROWTH Performed at [facility], [place] [address] [place], [place] [address]    Report Status 12/28/2017 FINAL  Final     Labs: Basic Metabolic Panel: Recent Labs  Lab 12/27/17 1641 12/28/17 0345 12/29/17 0410 12/30/17 0352 12/31/17 0622 01/01/18 0419  NA  --  141 142 144 145 145  K  --  4.0 3.1* 4.0 3.6 3.6  CL  --  107 110 111 110 109  CO2  --  25 24 21* 23 25  GLUCOSE  --  128* 99 93 103* 93  BUN  --  13 12 13 13 10   CREATININE  --  1.23 1.18 1.25* 1.21 1.11  CALCIUM  --  7.5* 7.6* 8.1* 7.8* 8.0*  MG 2.0  --   --   --   --   --    Liver Function Tests: Recent Labs  Lab 12/27/17 1356 12/28/17 0345  AST 48* 32  ALT 30 18  ALKPHOS 83 68  BILITOT 0.7 0.5  PROT 6.6 4.8*  ALBUMIN 1.9* 1.3*   CBC: Recent Labs  Lab 12/27/17 1356  12/28/17 0345 12/29/17 0410 12/30/17 0352 12/31/17 0622 01/01/18 0419  WBC 22.0*  --  15.5* 12.6* 14.5* 13.9* 15.1*  NEUTROABS 19.0*  --   --  11.4* 12.8*  --   --   HGB 10.8*   < >  9.4* 7.5* 8.9* 8.2* 9.6*  HCT 35.0*   < > 29.3* 23.7* 28.7* 25.2* 29.6*  MCV 91.1  --  87.7 86.2 87.5 88.1 86.0  PLT 308  --  198 205 241 225 206   < > = values in this interval not displayed.   BNP: BNP (last 3 results) Recent Labs    08/01/17 1253 11/24/17 1910 12/27/17 1359  BNP 475.0* 352.9* 466.3*     CBG: Recent Labs  Lab 12/31/17 0833 12/31/17 2203 01/01/18 0810 01/02/18 0757 01/02/18 1234  GLUCAP 97 80 96 79 77     IMAGING STUDIES Dg Chest 2 View  Result Date: 12/16/2017 CLINICAL DATA:  Shortness of breath EXAM: CHEST - 2 VIEW COMPARISON:  11/24/2017, 08/14/2017 FINDINGS: Left-sided pacing device as before. Worsening bilateral lung base consolidation posteriorly. No pleural effusion. Mild cardiomegaly with aortic  atherosclerosis. No pneumothorax. IMPRESSION: 1. Worsening bilateral lung base consolidations, possible pneumonia; imaging follow-up to resolution recommended to exclude underlying mass lesion given somewhat irregular appearance of the consolidation on the lateral view. Probable small left effusion. 2. Mild cardiomegaly. Electronically Signed   By: Donavan Foil M.D.   On: 12/16/2017 16:57   Dg Chest Port 1 View  Result Date: 12/27/2017 CLINICAL DATA:  Shortness of breath with dry cough EXAM: PORTABLE CHEST 1 VIEW COMPARISON:  12/16/2017 FINDINGS: There is bilateral lower lobe airspace disease. There is no pleural effusion or pneumothorax. The heart mediastinum are stable. There is a dual lead cardiac pacemaker. The osseous structures are unremarkable. IMPRESSION: Persistent hazy bilateral lower lobe airspace disease likely reflecting residual pneumonia. Electronically Signed   By: Kathreen Devoid   On: 12/27/2017 14:29    DISCHARGE EXAMINATION: Vitals:   01/01/18 1546 01/01/18 2137 01/02/18 0550 01/02/18 1129  BP: 132/84 (!) 166/83 (!) 166/85   Pulse: 94 98 85   Resp: (!) 32 19    Temp: 98.4 F (36.9 C) 98.3 F (36.8 C) 98.6 F (37 C)   TempSrc: Oral      SpO2: (!) 79% (!) 89% 95%   Weight:    71.9 kg  Height:    6\' 3"  (1.905 m)   General appearance: delirious Resp: Tachypneic at rest.  Coarse breath sounds bilaterally.  Crackles at the bases Cardio: regular rate and rhythm, S1, S2 normal, no murmur, click, rub or gallop GI: soft, non-tender; bowel sounds normal; no masses,  no organomegaly  DISPOSITION: Home  Discharge Instructions    Discharge instructions   Complete by:  As directed    Comfort feeds as tolerated.   You were cared for by a hospitalist during your hospital stay. If you have any questions about your discharge medications or the care you received while you were in the hospital after you are discharged, you can call the unit and asked to speak with the hospitalist on call if the hospitalist that took care of you is not available. Once you are discharged, your primary care physician will handle any further medical issues. Please note that NO REFILLS for any discharge medications will be authorized once you are discharged, as it is imperative that you return to your primary care physician (or establish a relationship with a primary care physician if you do not have one) for your aftercare needs so that they can reassess your need for medications and monitor your lab values. If you do not have a primary care physician, you can call [phone] for a physician referral.   Increase activity slowly   Complete by:  As directed         Allergies as of 01/02/2018      Reactions   Dyazide [hydrochlorothiazide W-triamterene] Other (See Comments)   Caused hyponatremia 04/2012   Lisinopril Cough      Medication List    STOP taking these medications   amoxicillin-clavulanate 500-125 MG tablet Commonly known as:  AUGMENTIN Replaced by:  amoxicillin-clavulanate 875-125 MG tablet   apixaban 2.5 MG Tabs tablet Commonly known as:  ELIQUIS   aspirin EC 81 MG tablet   atorvastatin 40 MG tablet Commonly known as:  LIPITOR     cholecalciferol 1000 units tablet Commonly known as:  VITAMIN D   clidinium-chlordiazePOXIDE 5-2.5 MG capsule Commonly known as:  LIBRAX   cyanocobalamin 1000 MCG tablet   escitalopram 10 MG tablet Commonly known as:  LEXAPRO   famotidine 20  MG tablet Commonly known as:  PEPCID   fluticasone 50 MCG/ACT nasal spray Commonly known as:  FLONASE   gabapentin 300 MG capsule Commonly known as:  NEURONTIN   mirtazapine 15 MG tablet Commonly known as:  REMERON   multivitamin-lutein Caps capsule   pantoprazole 40 MG tablet Commonly known as:  PROTONIX     TAKE these medications   acetaminophen 325 MG tablet Commonly known as:  TYLENOL Take 650 mg by mouth every 6 (six) hours as needed for headache (pain).   amoxicillin-clavulanate 875-125 MG tablet Commonly known as:  AUGMENTIN Take 1 tablet by mouth every 12 (twelve) hours for 5 days. Replaces:  amoxicillin-clavulanate 500-125 MG tablet   feeding supplement (ENSURE ENLIVE) Liqd Take 237 mLs by mouth 2 (two) times daily between meals.   feeding supplement (PRO-STAT SUGAR FREE 64) Liqd Take 30 mLs by mouth 2 (two) times daily.   ipratropium 0.02 % nebulizer solution Commonly known as:  ATROVENT Take 2.5 mLs (0.5 mg total) by nebulization 3 (three) times daily. Via [facility]   LORazepam 2 MG/ML concentrated solution Commonly known as:  ATIVAN Take 0.5 mLs (1 mg total) by mouth every 4 (four) hours as needed for anxiety.   morphine CONCENTRATE 10 MG/0.5ML Soln concentrated solution Take 0.5 mLs (10 mg total) by mouth every 2 (two) hours as needed for severe pain or shortness of breath.   polyethylene glycol powder powder Commonly known as:  GLYCOLAX/MIRALAX Take 17 g by mouth 2 (two) times daily as needed for mild constipation.   QUEtiapine 25 MG tablet Commonly known as:  SEROQUEL Take 1 tablet (25 mg total) by mouth at bedtime.   RESOURCE THICKENUP CLEAR Powd Take 1 g by mouth as needed. What changed:    how much  to take  when to take this  additional instructions   senna-docusate 8.6-50 MG tablet Commonly known as:  Senokot-S Take 1 tablet by mouth daily. What changed:    when to take this  reasons to take this   tetrahydrozoline 0.05 % ophthalmic solution Place 1 drop into both eyes daily as needed (dry eyes).          TOTAL DISCHARGE TIME: 35 minutes  Bonnielee Haff  [facility] Pager [phone]  01/02/2018, 1:41 PM

## 2018-01-03 ENCOUNTER — Telehealth: Payer: Self-pay | Admitting: Internal Medicine

## 2018-01-03 NOTE — Telephone Encounter (Signed)
Attempted TCM call no answer left voicemail to call office [facility] nurse may advise patient status since discharge from  hospital .

## 2018-01-06 ENCOUNTER — Inpatient Hospital Stay: Payer: Medicare Other | Admitting: Internal Medicine

## 2018-01-06 ENCOUNTER — Telehealth: Payer: Self-pay | Admitting: *Deleted

## 2018-01-06 ENCOUNTER — Telehealth: Payer: Self-pay

## 2018-01-06 NOTE — Telephone Encounter (Signed)
On 01/06/18 I received a dc from [employer] (original). DC is for cremation. Patient is a patient of Doctor Plotnikov.  DC will be taken to [facility] for signature.  On 01/09/2018 I received the dc back from Doctor Plotnikov.  I got the dc ready and faxed the dc to the funeral home per the funeral home request.

## 2018-01-06 NOTE — Telephone Encounter (Signed)
Pt was admitted back to hosp 12/27/17 w/ Severe sepsis, Recurrent aspiration pneumonia and Acute metabolic encephalopathy. He was transitioned to Unasyn and then subsequently to Augmentin.  Patient mental status did not improve much.  His oral intake remained poor.  Respiratory status has improved some but he continues to require oxygen.Provider consulted w/pt wife, which pt not improving despite maximal treatment.  This is likely end-of-life. Pt D/C 01/02/18 w/hospice services and will arrange home oxygen.Marland KitchenJohny Chess

## 2018-01-08 DEATH — deceased

## 2018-01-16 ENCOUNTER — Ambulatory Visit: Admit: 2018-01-16 | Payer: Medicare Other | Admitting: General Surgery

## 2018-01-16 SURGERY — REPAIR, HERNIA, UMBILICAL, ADULT
Anesthesia: General

## 2018-01-21 ENCOUNTER — Ambulatory Visit: Payer: Medicare Other | Admitting: Adult Health

## 2018-01-27 ENCOUNTER — Ambulatory Visit: Payer: Medicare Other | Admitting: Internal Medicine

## 2018-08-03 IMAGING — US US RENAL
1 series · 14 of 25 positions shown · non-contrast
Comparison: None.

CLINICAL DATA: Acute kidney injury

EXAM:
RENAL / URINARY TRACT ULTRASOUND COMPLETE

[Series 1: us renal · 0.23mm/px · 14 of 36 slices shown]
[im 1/36]
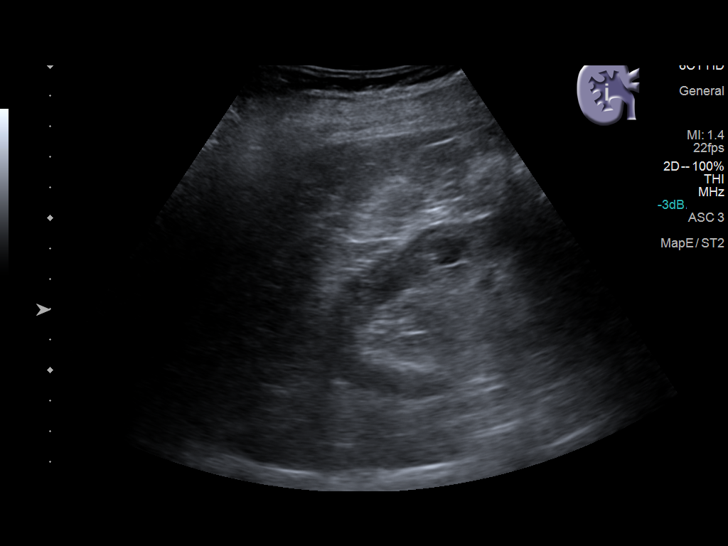
[im 3/36]
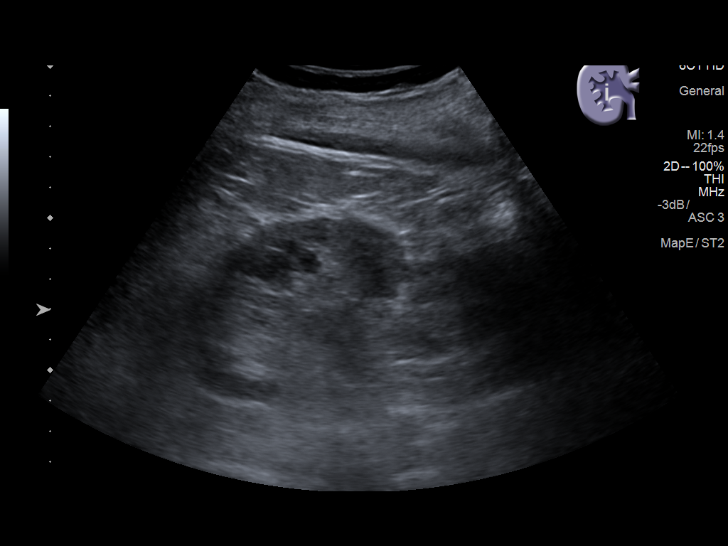
[im 6/36]
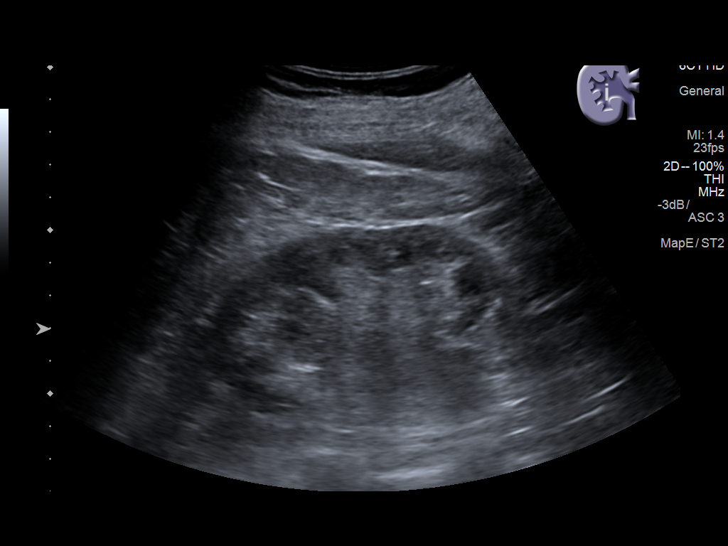
[im 9/36]
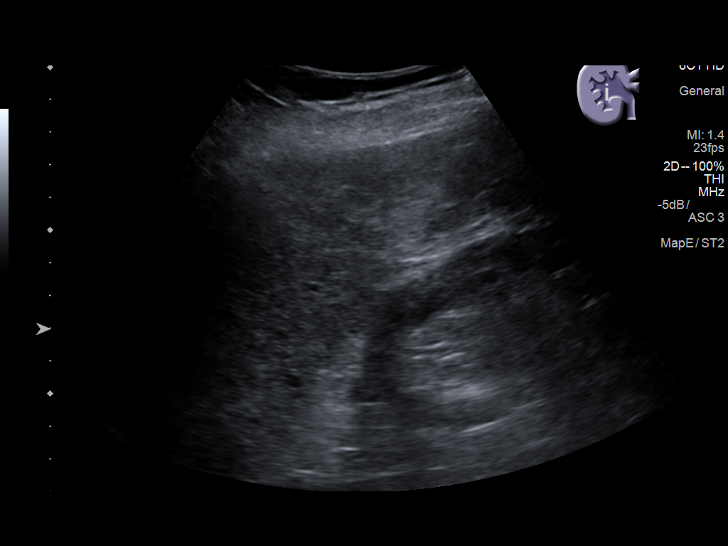
[im 12/36]
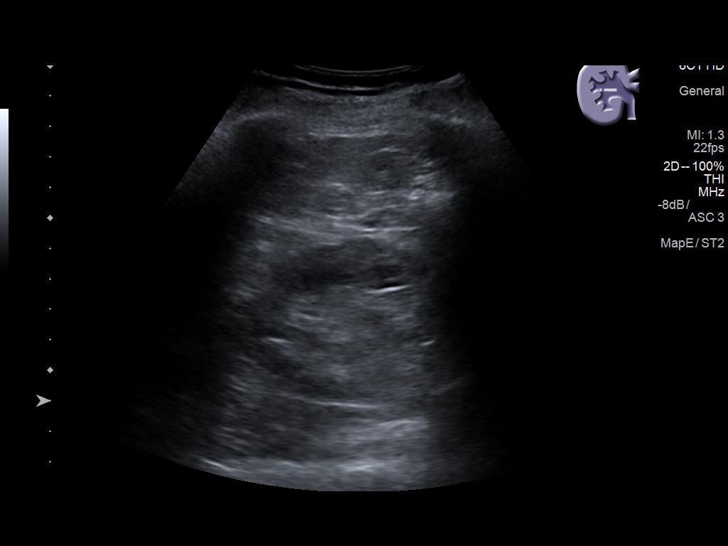
[im 14/36]
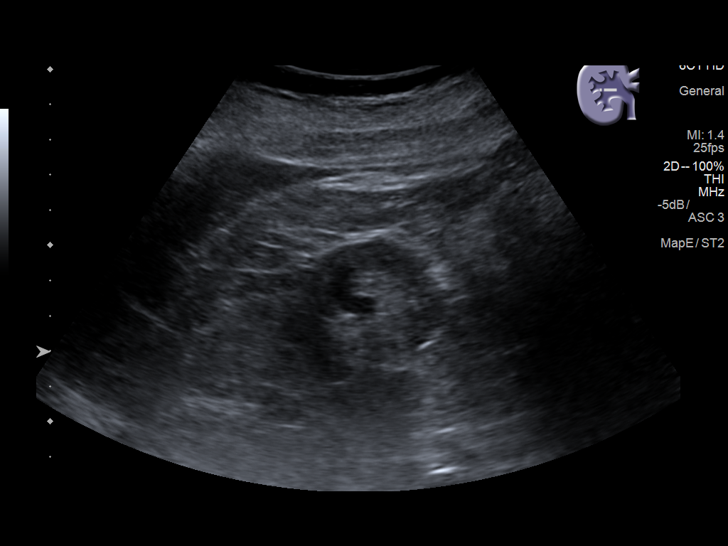
[im 17/36]
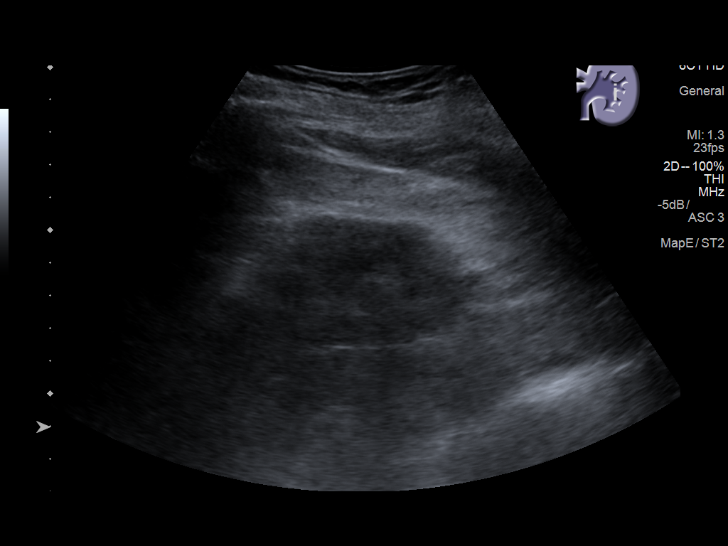
[im 19/36]
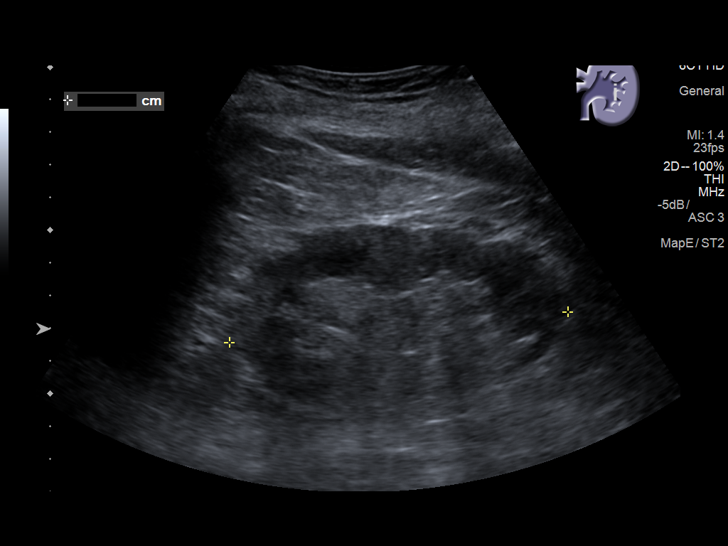
[im 22/36]
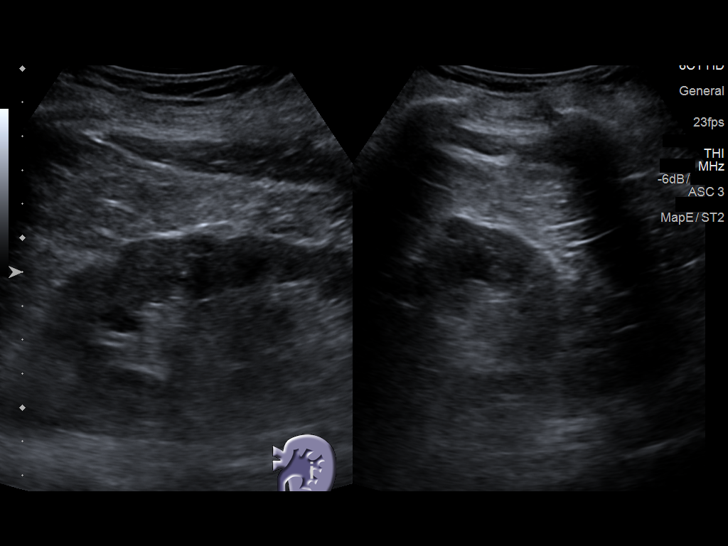
[im 24/36]
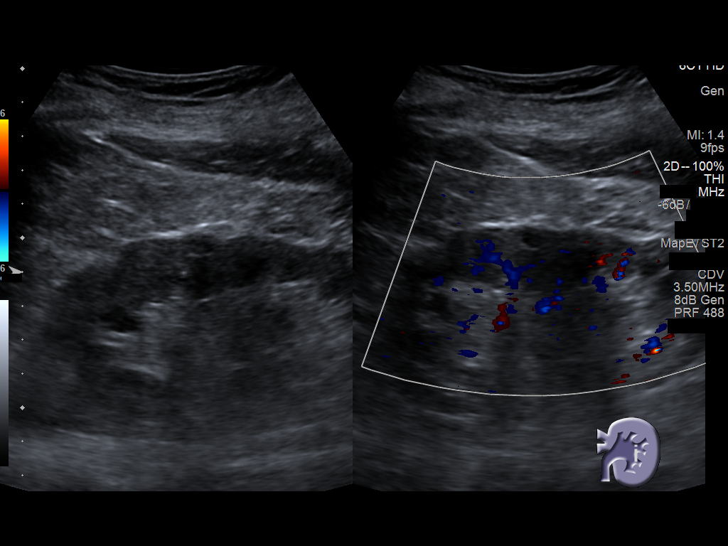
[im 27/36]
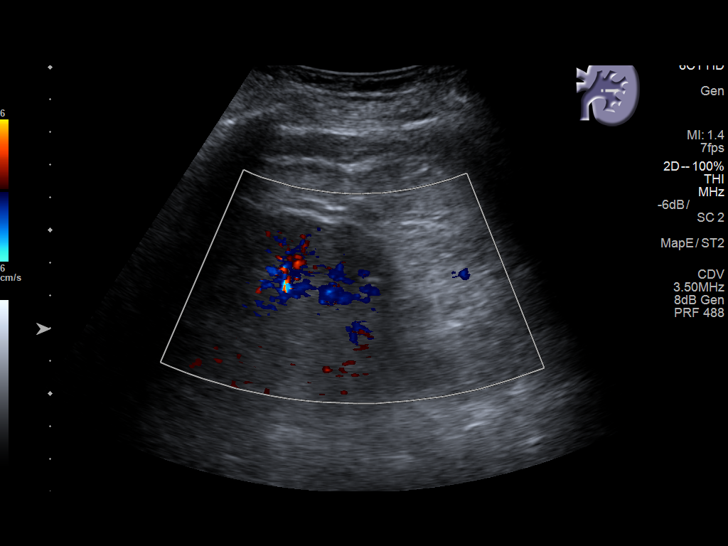
[im 30/36]
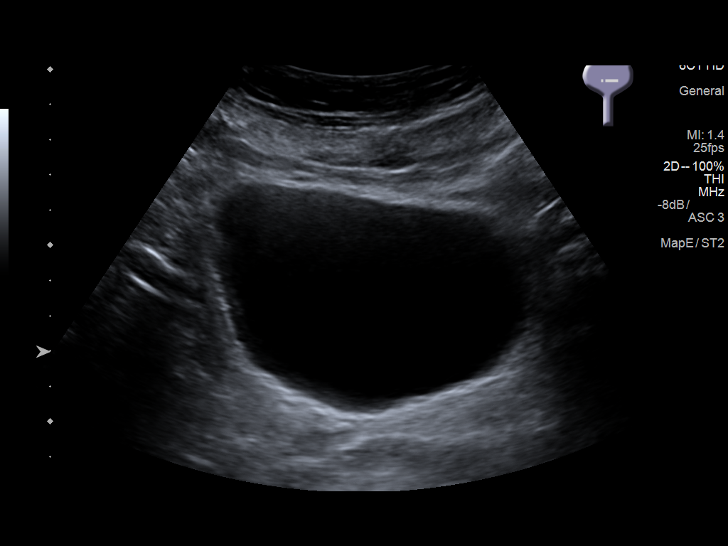
[im 33/36]
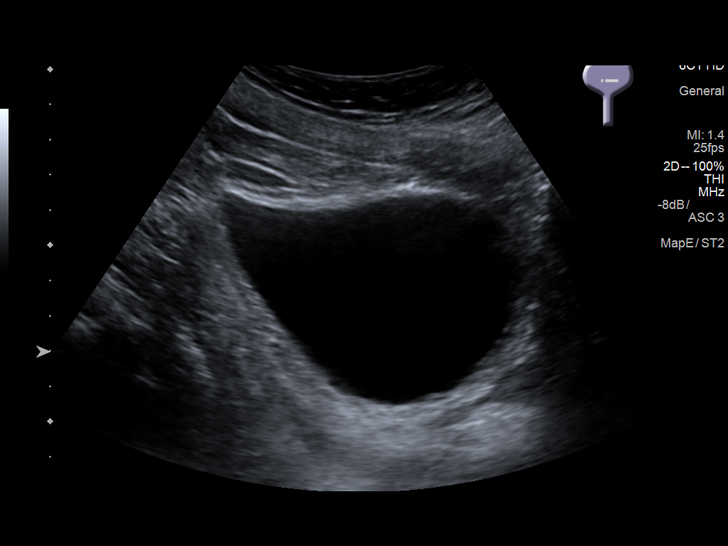
[im 36/36]
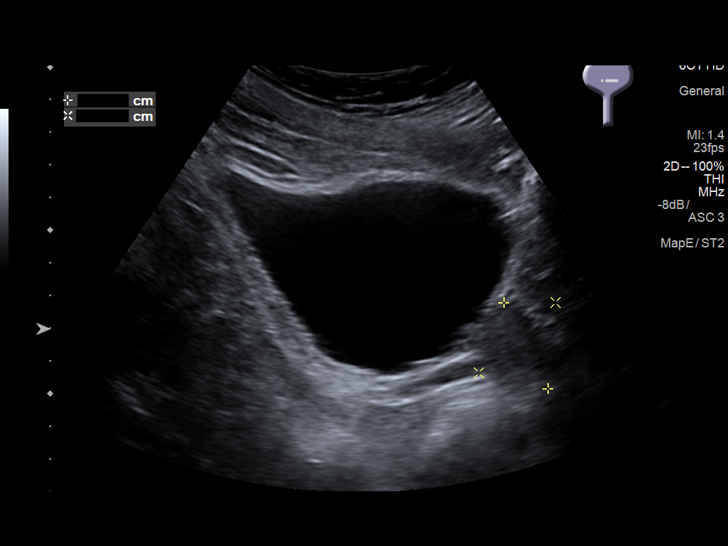

[14 of 25 positions shown; findings below may reference images not displayed]

FINDINGS: Right Kidney:

Length: 9.6 cm. Echogenicity within normal limits. No mass or
hydronephrosis visualized.

Left Kidney:

Length: 10.4 cm. Echogenicity within normal limits. No mass or
hydronephrosis visualized. Small renal cyst measures 9 x 6 x 7 mm.

Bladder:

Appears normal for degree of bladder distention.

Prostate:

Prostate is mildly enlarged measuring 3.2 x 3.0 x 5.2 cm.
IMPRESSION: 1. No hydronephrosis or other focal renal abnormality.
2. Mildly enlarged prostate gland.

## 2018-08-10 IMAGING — DX DG CHEST 2V
2 series · 2 of 2 positions shown · non-contrast
Comparison: 01/29/2016.

CLINICAL DATA: Left-sided chest wall pain. Fell on the ice last
week.

EXAM:
CHEST  2 VIEW

[chest pa]
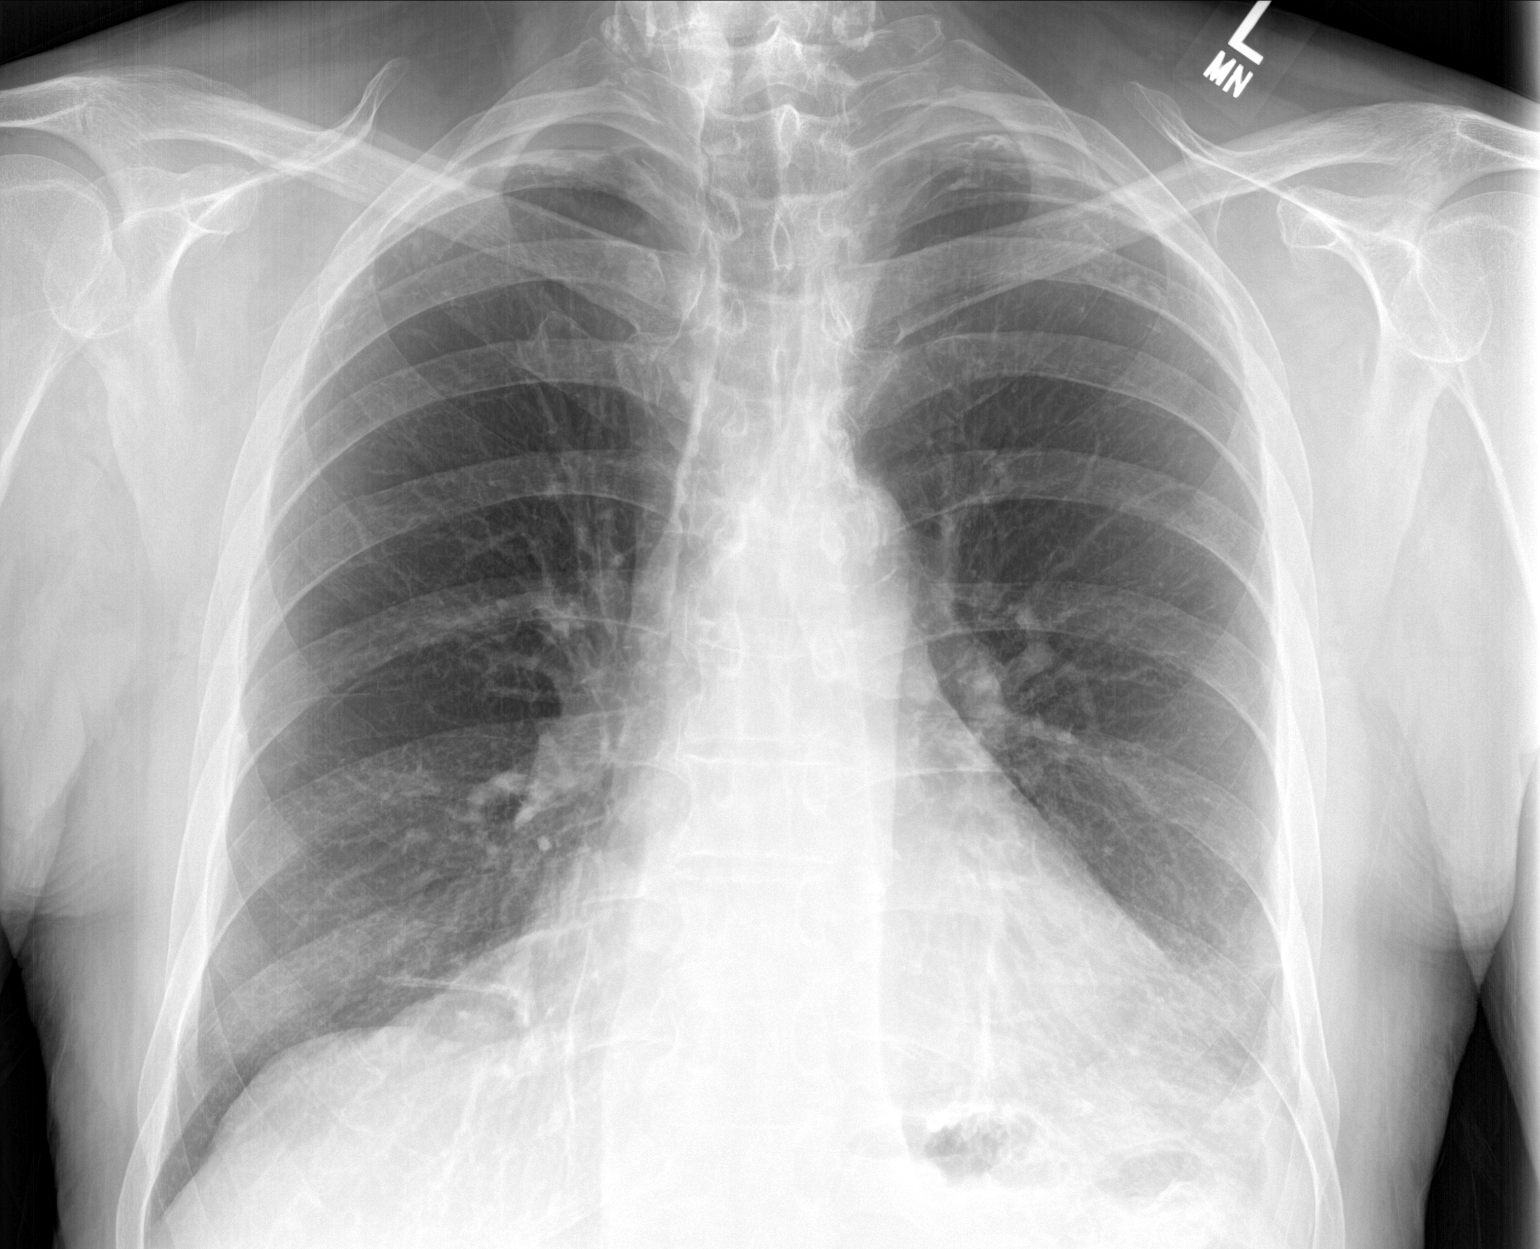

[chest lat]
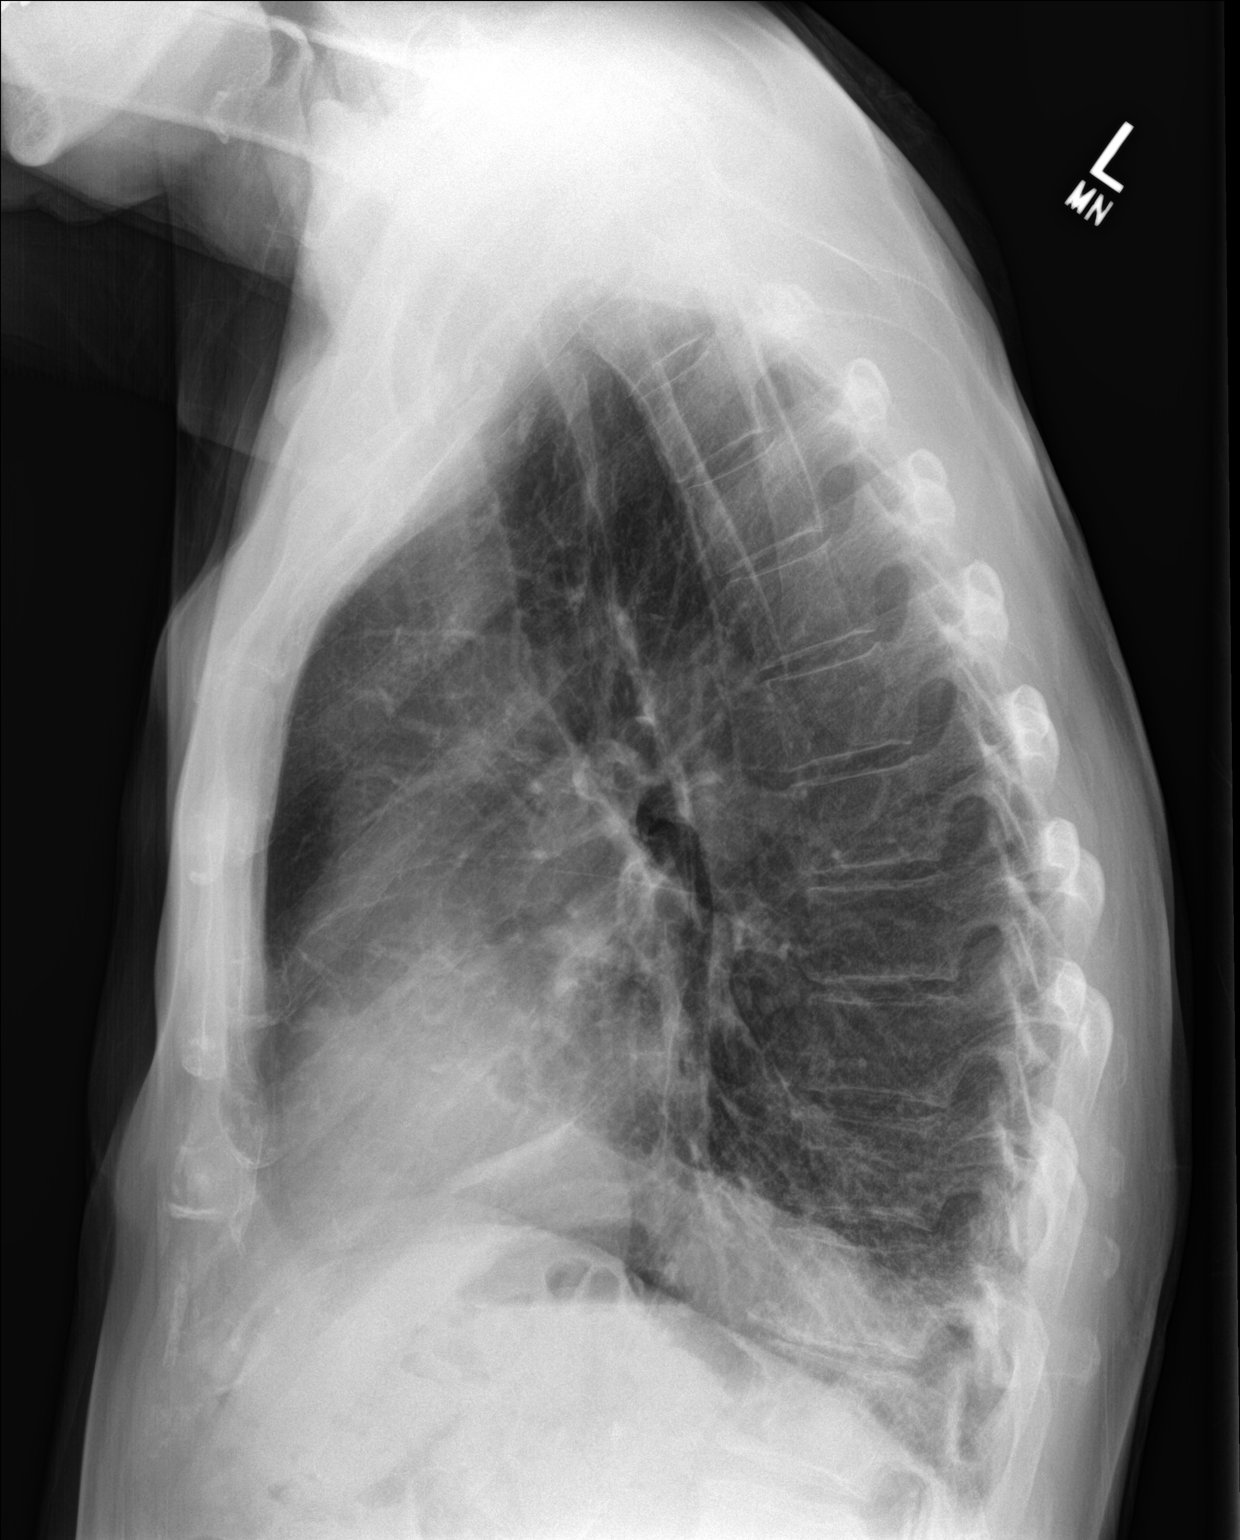

[2 of 2 positions shown; findings below may reference images not displayed]

FINDINGS: Nondisplaced fracture of the left eighth rib is again noted.

There is opacity at the left lung base consistent with atelectasis,
also stable from the prior study. There is mild stable
pleuroparenchymal scarring at the apices. Remainder of the lungs is
clear. No pneumothorax.

Cardiac silhouette is normal in size. No mediastinal or hilar masses
or convincing adenopathy.
IMPRESSION: 1. Nondisplaced left lateral eighth rib fracture, unchanged from the
prior exam.
2. Persistent left lung base opacity consistent with atelectasis. No
new lung abnormalities. No pleural effusion or pneumothorax.

## 2019-12-08 IMAGING — DX DG CHEST 2V
2 series · 2 of 2 positions shown · non-contrast
Comparison: None.

CLINICAL DATA: Transient ischemic attack. History of cardiac
arrhythmia

EXAM:
CHEST - 2 VIEW

[chest lat]
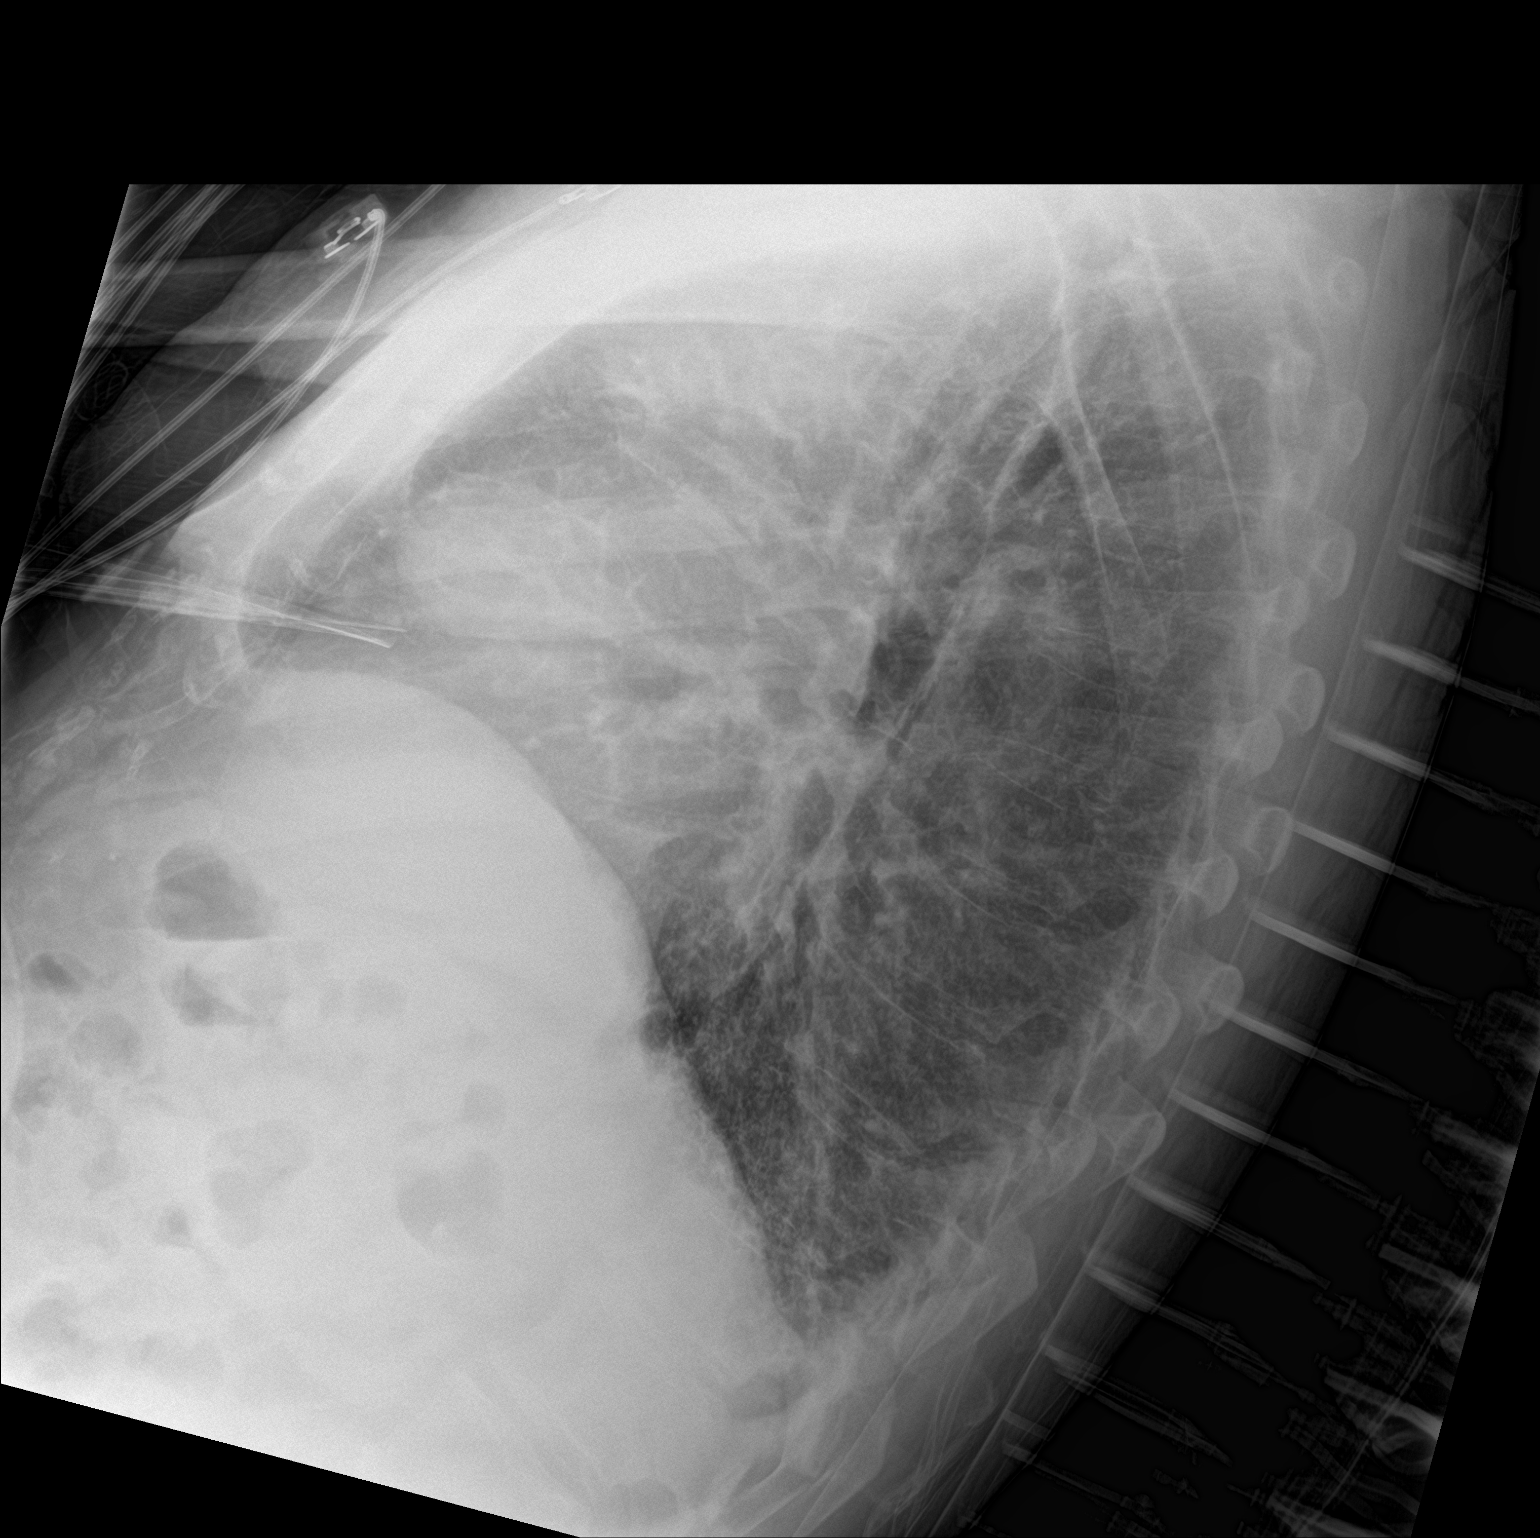

[chest ap]
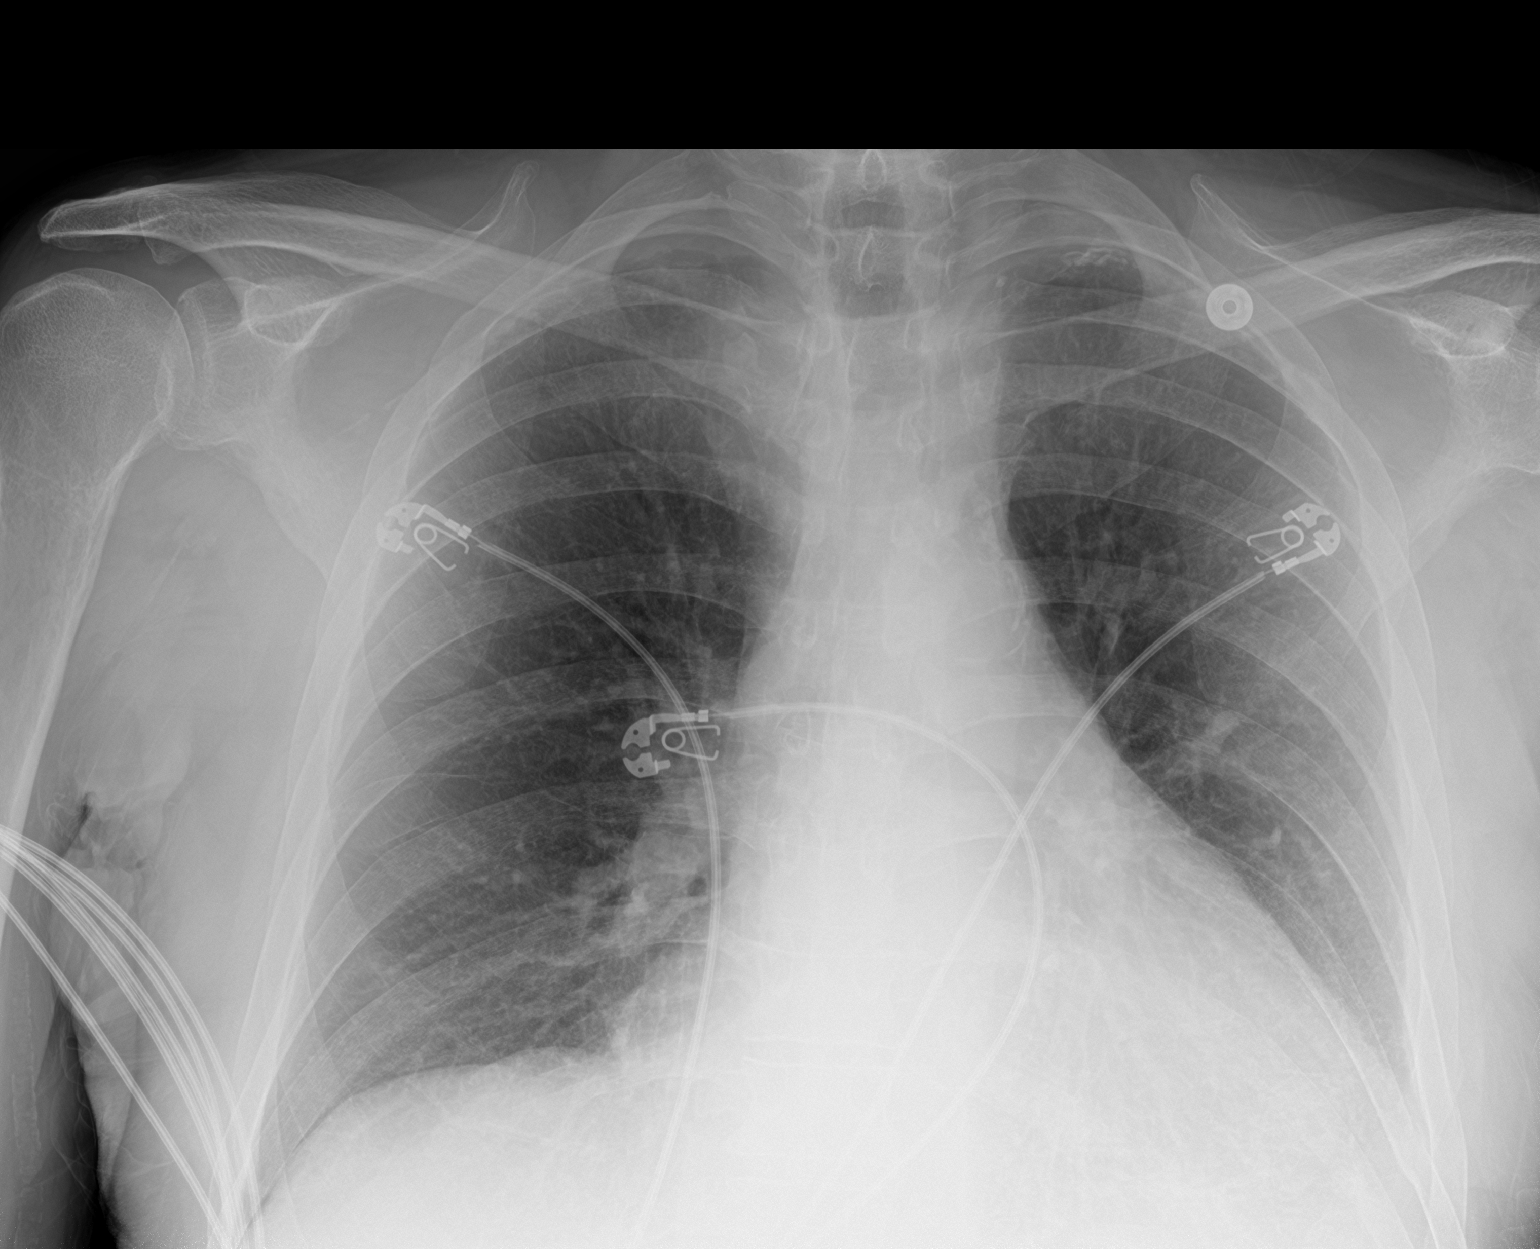

[2 of 2 positions shown; findings below may reference images not displayed]

FINDINGS: There is bibasilar fibrotic change with mild lower lobe
bronchiectatic change. No edema or consolidation. Heart is mildly
enlarged with pulmonary vascularity normal. No adenopathy. There is
aortic atherosclerosis. No evident bone lesions. There is
calcification in both carotid arteries.
IMPRESSION: Bibasilar fibrosis and bronchiectasis. No frank edema or
consolidation. Stable cardiac prominence with pulmonary vascularity
normal. There is aortic atherosclerosis as well as extensive carotid
artery calcification bilaterally.

Aortic Atherosclerosis ([id number].I).
